# Patient Record
Sex: Male | Born: 1955
Health system: Southern US, Community
[De-identification: ages and names within clinical notes are randomized; demographics above are authoritative.]

## PROBLEM LIST (undated history)

## (undated) DIAGNOSIS — IMO0002 Reserved for concepts with insufficient information to code with codable children: Secondary | ICD-10-CM

## (undated) DIAGNOSIS — K2270 Barrett's esophagus without dysplasia: Secondary | ICD-10-CM

## (undated) DIAGNOSIS — I219 Acute myocardial infarction, unspecified: Secondary | ICD-10-CM

## (undated) DIAGNOSIS — G629 Polyneuropathy, unspecified: Secondary | ICD-10-CM

## (undated) DIAGNOSIS — F419 Anxiety disorder, unspecified: Secondary | ICD-10-CM

## (undated) DIAGNOSIS — E7800 Pure hypercholesterolemia, unspecified: Secondary | ICD-10-CM

## (undated) DIAGNOSIS — F32A Depression, unspecified: Secondary | ICD-10-CM

## (undated) DIAGNOSIS — I2510 Atherosclerotic heart disease of native coronary artery without angina pectoris: Secondary | ICD-10-CM

## (undated) DIAGNOSIS — I10 Essential (primary) hypertension: Secondary | ICD-10-CM

## (undated) DIAGNOSIS — K219 Gastro-esophageal reflux disease without esophagitis: Secondary | ICD-10-CM

## (undated) DIAGNOSIS — F329 Major depressive disorder, single episode, unspecified: Secondary | ICD-10-CM

## (undated) DIAGNOSIS — E1165 Type 2 diabetes mellitus with hyperglycemia: Secondary | ICD-10-CM

## (undated) DIAGNOSIS — Z87891 Personal history of nicotine dependence: Secondary | ICD-10-CM

## (undated) HISTORY — PX: TONSILLECTOMY: SUR1361

---

## 2006-08-08 ENCOUNTER — Inpatient Hospital Stay (HOSPITAL_COMMUNITY): Admission: EM | Admit: 2006-08-08 | Discharge: 2006-08-10 | Payer: Self-pay | Admitting: Emergency Medicine

## 2007-01-28 ENCOUNTER — Ambulatory Visit: Payer: Self-pay | Admitting: Nurse Practitioner

## 2007-01-31 ENCOUNTER — Ambulatory Visit: Payer: Self-pay | Admitting: *Deleted

## 2007-02-10 ENCOUNTER — Ambulatory Visit: Payer: Self-pay | Admitting: Nurse Practitioner

## 2007-04-23 ENCOUNTER — Emergency Department (HOSPITAL_COMMUNITY): Admission: EM | Admit: 2007-04-23 | Discharge: 2007-04-23 | Payer: Self-pay | Admitting: Emergency Medicine

## 2007-05-16 ENCOUNTER — Ambulatory Visit: Payer: Self-pay | Admitting: Family Medicine

## 2007-11-09 ENCOUNTER — Ambulatory Visit: Payer: Self-pay | Admitting: Internal Medicine

## 2008-06-14 ENCOUNTER — Emergency Department (HOSPITAL_COMMUNITY): Admission: EM | Admit: 2008-06-14 | Discharge: 2008-06-14 | Payer: Self-pay | Admitting: Family Medicine

## 2008-08-31 ENCOUNTER — Ambulatory Visit: Payer: Self-pay | Admitting: Internal Medicine

## 2008-09-25 ENCOUNTER — Ambulatory Visit: Payer: Self-pay | Admitting: Internal Medicine

## 2008-09-25 ENCOUNTER — Encounter: Payer: Self-pay | Admitting: Family Medicine

## 2008-09-25 LAB — CONVERTED CEMR LAB
Alkaline Phosphatase: 138 units/L — ABNORMAL HIGH (ref 39–117)
Basophils Relative: 1 % (ref 0–1)
Eosinophils Absolute: 0.1 10*3/uL (ref 0.0–0.7)
Eosinophils Relative: 3 % (ref 0–5)
Glucose, Bld: 154 mg/dL — ABNORMAL HIGH (ref 70–99)
HCT: 44.3 % (ref 39.0–52.0)
HDL: 39 mg/dL — ABNORMAL LOW (ref >39)
LDL Cholesterol: 90 mg/dL (ref 0–99)
Lymphs Abs: 1.9 10*3/uL (ref 0.7–4.0)
MCHC: 33.6 g/dL (ref 30.0–36.0)
MCV: 85.2 fL (ref 78.0–100.0)
Microalb, Ur: 0.94 mg/dL (ref 0.00–1.89)
Platelets: 285 10*3/uL (ref 150–400)
RDW: 13.4 % (ref 11.5–15.5)
Sodium: 140 meq/L (ref 135–145)
Total Bilirubin: 0.6 mg/dL (ref 0.3–1.2)
Total Protein: 7 g/dL (ref 6.0–8.3)
Triglycerides: 97 mg/dL (ref <150)
VLDL: 19 mg/dL (ref 0–40)
WBC: 4.3 10*3/uL (ref 4.0–10.5)

## 2008-09-28 ENCOUNTER — Ambulatory Visit: Payer: Self-pay | Admitting: Internal Medicine

## 2009-04-12 ENCOUNTER — Emergency Department (HOSPITAL_COMMUNITY): Admission: EM | Admit: 2009-04-12 | Discharge: 2009-04-13 | Payer: Self-pay | Admitting: Emergency Medicine

## 2009-04-12 ENCOUNTER — Emergency Department (HOSPITAL_COMMUNITY): Admission: EM | Admit: 2009-04-12 | Discharge: 2009-04-12 | Payer: Self-pay | Admitting: Family Medicine

## 2009-04-14 ENCOUNTER — Ambulatory Visit: Payer: Self-pay | Admitting: Internal Medicine

## 2009-04-14 ENCOUNTER — Inpatient Hospital Stay (HOSPITAL_COMMUNITY): Admission: EM | Admit: 2009-04-14 | Discharge: 2009-04-17 | Payer: Self-pay | Admitting: Emergency Medicine

## 2010-10-24 ENCOUNTER — Inpatient Hospital Stay (HOSPITAL_COMMUNITY): Admission: EM | Admit: 2010-10-24 | Discharge: 2010-10-26 | Payer: Self-pay | Admitting: Emergency Medicine

## 2010-11-12 ENCOUNTER — Encounter (INDEPENDENT_AMBULATORY_CARE_PROVIDER_SITE_OTHER): Payer: Self-pay | Admitting: *Deleted

## 2010-11-12 LAB — CONVERTED CEMR LAB
ALT: 10 units/L (ref 0–53)
AST: 17 units/L (ref 0–37)
Albumin: 3.8 g/dL (ref 3.5–5.2)
Alkaline Phosphatase: 84 units/L (ref 39–117)
BUN: 12 mg/dL (ref 6–23)
CO2: 21 meq/L (ref 19–32)
Calcium: 9 mg/dL (ref 8.4–10.5)
Chloride: 103 meq/L (ref 96–112)
Creatinine, Ser: 1.04 mg/dL (ref 0.40–1.50)
Glucose, Bld: 351 mg/dL — ABNORMAL HIGH (ref 70–99)
Hgb A1c MFr Bld: 10.2 % — ABNORMAL HIGH (ref <5.7)
Microalb, Ur: 0.5 mg/dL (ref 0.00–1.89)
Potassium: 4.4 meq/L (ref 3.5–5.3)
Sodium: 136 meq/L (ref 135–145)
Total Bilirubin: 0.4 mg/dL (ref 0.3–1.2)
Total Protein: 6.1 g/dL (ref 6.0–8.3)

## 2010-12-11 ENCOUNTER — Emergency Department (HOSPITAL_COMMUNITY)
Admission: EM | Admit: 2010-12-11 | Discharge: 2010-12-11 | Payer: Self-pay | Source: Home / Self Care | Admitting: Family Medicine

## 2011-01-06 ENCOUNTER — Emergency Department (HOSPITAL_COMMUNITY): Payer: Self-pay

## 2011-01-06 ENCOUNTER — Inpatient Hospital Stay (HOSPITAL_COMMUNITY)
Admission: AD | Admit: 2011-01-06 | Discharge: 2011-01-08 | DRG: 638 | Discharge status: Against Medical Advice | Payer: Self-pay | Source: Other Acute Inpatient Hospital | Attending: Family Medicine | Admitting: Family Medicine

## 2011-01-06 ENCOUNTER — Emergency Department (HOSPITAL_COMMUNITY)
Admission: EM | Admit: 2011-01-06 | Discharge: 2011-01-06 | Disposition: A | Discharge status: Other Acute Inpatient Hospital | Payer: Self-pay | Attending: Emergency Medicine | Admitting: Emergency Medicine

## 2011-01-06 DIAGNOSIS — I498 Other specified cardiac arrhythmias: Secondary | ICD-10-CM | POA: Diagnosis present

## 2011-01-06 DIAGNOSIS — Z91199 Patient's noncompliance with other medical treatment and regimen due to unspecified reason: Secondary | ICD-10-CM

## 2011-01-06 DIAGNOSIS — E875 Hyperkalemia: Secondary | ICD-10-CM | POA: Diagnosis present

## 2011-01-06 DIAGNOSIS — Z794 Long term (current) use of insulin: Secondary | ICD-10-CM

## 2011-01-06 DIAGNOSIS — D72829 Elevated white blood cell count, unspecified: Secondary | ICD-10-CM | POA: Diagnosis present

## 2011-01-06 DIAGNOSIS — M25569 Pain in unspecified knee: Secondary | ICD-10-CM | POA: Diagnosis present

## 2011-01-06 DIAGNOSIS — E1010 Type 1 diabetes mellitus with ketoacidosis without coma: Secondary | ICD-10-CM | POA: Insufficient documentation

## 2011-01-06 DIAGNOSIS — E860 Dehydration: Secondary | ICD-10-CM | POA: Diagnosis present

## 2011-01-06 DIAGNOSIS — E1310 Other specified diabetes mellitus with ketoacidosis without coma: Principal | ICD-10-CM | POA: Diagnosis present

## 2011-01-06 DIAGNOSIS — N179 Acute kidney failure, unspecified: Secondary | ICD-10-CM | POA: Diagnosis present

## 2011-01-06 DIAGNOSIS — R4182 Altered mental status, unspecified: Secondary | ICD-10-CM | POA: Diagnosis present

## 2011-01-06 DIAGNOSIS — N289 Disorder of kidney and ureter, unspecified: Secondary | ICD-10-CM | POA: Insufficient documentation

## 2011-01-06 DIAGNOSIS — Z9119 Patient's noncompliance with other medical treatment and regimen: Secondary | ICD-10-CM

## 2011-01-06 LAB — URINALYSIS, ROUTINE W REFLEX MICROSCOPIC
Bilirubin Urine: NEGATIVE
Hgb urine dipstick: NEGATIVE
Ketones, ur: >80 mg/dL — AB
Leukocytes, UA: NEGATIVE
Nitrite: NEGATIVE
Protein, ur: NEGATIVE mg/dL
Specific Gravity, Urine: 1.029 (ref 1.005–1.030)
Urine Glucose, Fasting: >1000 mg/dL — AB
Urobilinogen, UA: 0.2 mg/dL (ref 0.0–1.0)
pH: 5.5 (ref 5.0–8.0)

## 2011-01-06 LAB — COMPREHENSIVE METABOLIC PANEL WITH GFR
ALT: 14 U/L (ref 0–53)
Alkaline Phosphatase: 114 U/L (ref 39–117)
BUN: 25 mg/dL — ABNORMAL HIGH (ref 6–23)
CO2: 14 meq/L — ABNORMAL LOW (ref 19–32)
GFR calc non Af Amer: 37 mL/min — ABNORMAL LOW (ref >60)
Glucose, Bld: 843 mg/dL (ref 70–99)
Potassium: 6 meq/L — ABNORMAL HIGH (ref 3.5–5.1)
Sodium: 133 meq/L — ABNORMAL LOW (ref 135–145)

## 2011-01-06 LAB — COMPREHENSIVE METABOLIC PANEL
AST: 21 U/L (ref 0–37)
Albumin: 4.8 g/dL (ref 3.5–5.2)
Calcium: 10.5 mg/dL (ref 8.4–10.5)
Chloride: 95 mEq/L — ABNORMAL LOW (ref 96–112)
Creatinine, Ser: 1.94 mg/dL — ABNORMAL HIGH (ref 0.4–1.5)
GFR calc Af Amer: 44 mL/min — ABNORMAL LOW (ref >60)
Total Bilirubin: 1.4 mg/dL — ABNORMAL HIGH (ref 0.3–1.2)
Total Protein: 8.1 g/dL (ref 6.0–8.3)

## 2011-01-06 LAB — CBC
HCT: 48.1 % (ref 39.0–52.0)
Hemoglobin: 16.2 g/dL (ref 13.0–17.0)
MCH: 30.7 pg (ref 26.0–34.0)
MCHC: 33.7 g/dL (ref 30.0–36.0)
MCV: 91.3 fL (ref 78.0–100.0)
Platelets: 305 K/uL (ref 150–400)
RBC: 5.27 MIL/uL (ref 4.22–5.81)
RDW: 12.8 % (ref 11.5–15.5)
WBC: 14.8 K/uL — ABNORMAL HIGH (ref 4.0–10.5)

## 2011-01-06 LAB — BASIC METABOLIC PANEL
Calcium: 9.9 mg/dL (ref 8.4–10.5)
GFR calc Af Amer: 41 mL/min — ABNORMAL LOW (ref >60)
GFR calc non Af Amer: 34 mL/min — ABNORMAL LOW (ref >60)
Potassium: 4.8 mEq/L (ref 3.5–5.1)
Sodium: 143 mEq/L (ref 135–145)

## 2011-01-06 LAB — GLUCOSE, CAPILLARY
Glucose-Capillary: >600 mg/dL (ref 70–99)
Glucose-Capillary: >600 mg/dL (ref 70–99)
Glucose-Capillary: >600 mg/dL (ref 70–99)
Glucose-Capillary: 470 mg/dL — ABNORMAL HIGH (ref 70–99)
Glucose-Capillary: 422 mg/dL — ABNORMAL HIGH (ref 70–99)
Glucose-Capillary: 317 mg/dL — ABNORMAL HIGH (ref 70–99)
Glucose-Capillary: 244 mg/dL — ABNORMAL HIGH (ref 70–99)
Glucose-Capillary: 280 mg/dL — ABNORMAL HIGH (ref 70–99)
Glucose-Capillary: 283 mg/dL — ABNORMAL HIGH (ref 70–99)
Glucose-Capillary: 314 mg/dL — ABNORMAL HIGH (ref 70–99)

## 2011-01-06 LAB — DIFFERENTIAL
Basophils Absolute: 0 10*3/uL (ref 0.0–0.1)
Basophils Relative: 0 % (ref 0–1)
Eosinophils Absolute: 0 K/uL (ref 0.0–0.7)
Eosinophils Relative: 0 % (ref 0–5)
Lymphocytes Relative: 6 % — ABNORMAL LOW (ref 12–46)
Lymphs Abs: 0.9 K/uL (ref 0.7–4.0)
Monocytes Absolute: 0.5 K/uL (ref 0.1–1.0)
Monocytes Relative: 3 % (ref 3–12)
Neutro Abs: 13.5 10*3/uL — ABNORMAL HIGH (ref 1.7–7.7)
Neutrophils Relative %: 91 % — ABNORMAL HIGH (ref 43–77)

## 2011-01-06 LAB — BASIC METABOLIC PANEL WITH GFR
BUN: 25 mg/dL — ABNORMAL HIGH (ref 6–23)
CO2: 14 meq/L — ABNORMAL LOW (ref 19–32)
Chloride: 107 meq/L (ref 96–112)
Creatinine, Ser: 2.06 mg/dL — ABNORMAL HIGH (ref 0.4–1.5)
Glucose, Bld: 479 mg/dL — ABNORMAL HIGH (ref 70–99)

## 2011-01-06 LAB — CK TOTAL AND CKMB (NOT AT ARMC)
CK, MB: 1.7 ng/mL (ref 0.3–4.0)
Relative Index: INVALID (ref 0.0–2.5)
Total CK: 47 U/L (ref 7–232)

## 2011-01-06 LAB — BLOOD GAS, VENOUS
Acid-base deficit: 13.3 mmol/L — ABNORMAL HIGH (ref 0.0–2.0)
Bicarbonate: 12.8 mEq/L — ABNORMAL LOW (ref 20.0–24.0)
O2 Saturation: 75.3 %
Patient temperature: 98.6
TCO2: 11.7 mmol/L (ref 0–100)
pCO2, Ven: 31.1 mmHg — ABNORMAL LOW (ref 45.0–50.0)
pH, Ven: 7.239 — ABNORMAL LOW (ref 7.250–7.300)
pO2, Ven: 45.3 mmHg — ABNORMAL HIGH (ref 30.0–45.0)

## 2011-01-06 LAB — KETONES, QUALITATIVE

## 2011-01-06 LAB — URINE MICROSCOPIC-ADD ON

## 2011-01-06 LAB — TROPONIN I: Troponin I: 0.03 ng/mL (ref 0.00–0.06)

## 2011-01-06 LAB — PHOSPHORUS: Phosphorus: 4.8 mg/dL — ABNORMAL HIGH (ref 2.3–4.6)

## 2011-01-06 LAB — MAGNESIUM: Magnesium: 2.8 mg/dL — ABNORMAL HIGH (ref 1.5–2.5)

## 2011-01-06 LAB — TSH: TSH: 0.596 u[IU]/mL (ref 0.350–4.500)

## 2011-01-07 ENCOUNTER — Inpatient Hospital Stay (HOSPITAL_COMMUNITY): Payer: Self-pay

## 2011-01-07 LAB — HEMOGLOBIN A1C
Hgb A1c MFr Bld: 10.1 % — ABNORMAL HIGH (ref <5.7)
Hgb A1c MFr Bld: 10.3 % — ABNORMAL HIGH (ref <5.7)
Mean Plasma Glucose: 243 mg/dL — ABNORMAL HIGH (ref <117)
Mean Plasma Glucose: 249 mg/dL — ABNORMAL HIGH (ref <117)

## 2011-01-07 LAB — BASIC METABOLIC PANEL WITH GFR
BUN: 22 mg/dL (ref 6–23)
BUN: 20 mg/dL (ref 6–23)
BUN: 19 mg/dL (ref 6–23)
CO2: 16 meq/L — ABNORMAL LOW (ref 19–32)
CO2: 21 meq/L (ref 19–32)
Calcium: 9.2 mg/dL (ref 8.4–10.5)
Calcium: 9 mg/dL (ref 8.4–10.5)
Calcium: 8.6 mg/dL (ref 8.4–10.5)
Chloride: 114 meq/L — ABNORMAL HIGH (ref 96–112)
Chloride: 109 meq/L (ref 96–112)
Chloride: 113 meq/L — ABNORMAL HIGH (ref 96–112)
Creatinine, Ser: 1.45 mg/dL (ref 0.4–1.5)
Creatinine, Ser: 1.32 mg/dL (ref 0.4–1.5)
Creatinine, Ser: 1.19 mg/dL (ref 0.4–1.5)
Creatinine, Ser: 1.16 mg/dL (ref 0.4–1.5)
GFR calc Af Amer: >60 mL/min (ref >60)
GFR calc non Af Amer: 51 mL/min — ABNORMAL LOW (ref >60)
GFR calc non Af Amer: 57 mL/min — ABNORMAL LOW (ref >60)
GFR calc non Af Amer: >60 mL/min (ref >60)
Glucose, Bld: 250 mg/dL — ABNORMAL HIGH (ref 70–99)
Glucose, Bld: 155 mg/dL — ABNORMAL HIGH (ref 70–99)
Glucose, Bld: 192 mg/dL — ABNORMAL HIGH (ref 70–99)
Potassium: 4.5 meq/L (ref 3.5–5.1)
Potassium: 4.2 meq/L (ref 3.5–5.1)
Potassium: 4.2 meq/L (ref 3.5–5.1)
Potassium: 4.7 meq/L (ref 3.5–5.1)
Sodium: 137 meq/L (ref 135–145)

## 2011-01-07 LAB — DIFFERENTIAL
Basophils Absolute: 0 10*3/uL (ref 0.0–0.1)
Basophils Relative: 0 % (ref 0–1)
Eosinophils Absolute: 0 K/uL (ref 0.0–0.7)
Eosinophils Relative: 0 % (ref 0–5)
Lymphocytes Relative: 15 % (ref 12–46)
Lymphs Abs: 2.4 K/uL (ref 0.7–4.0)
Monocytes Absolute: 1.2 10*3/uL — ABNORMAL HIGH (ref 0.1–1.0)
Monocytes Relative: 8 % (ref 3–12)
Neutro Abs: 11.8 10*3/uL — ABNORMAL HIGH (ref 1.7–7.7)
Neutrophils Relative %: 76 % (ref 43–77)

## 2011-01-07 LAB — BASIC METABOLIC PANEL
BUN: 17 mg/dL (ref 6–23)
BUN: 15 mg/dL (ref 6–23)
CO2: 21 mEq/L (ref 19–32)
CO2: 21 mEq/L (ref 19–32)
CO2: 21 mEq/L (ref 19–32)
Calcium: 9.5 mg/dL (ref 8.4–10.5)
Calcium: 8.5 mg/dL (ref 8.4–10.5)
Chloride: 115 mEq/L — ABNORMAL HIGH (ref 96–112)
Chloride: 112 mEq/L (ref 96–112)
Creatinine, Ser: 1.31 mg/dL (ref 0.4–1.5)
GFR calc Af Amer: >60 mL/min (ref >60)
GFR calc Af Amer: >60 mL/min (ref >60)
GFR calc Af Amer: >60 mL/min (ref >60)
GFR calc Af Amer: >60 mL/min (ref >60)
GFR calc non Af Amer: 57 mL/min — ABNORMAL LOW (ref >60)
GFR calc non Af Amer: >60 mL/min (ref >60)
Glucose, Bld: 176 mg/dL — ABNORMAL HIGH (ref 70–99)
Glucose, Bld: 422 mg/dL — ABNORMAL HIGH (ref 70–99)
Potassium: 3.9 mEq/L (ref 3.5–5.1)
Sodium: 141 mEq/L (ref 135–145)
Sodium: 145 mEq/L (ref 135–145)
Sodium: 144 mEq/L (ref 135–145)
Sodium: 140 mEq/L (ref 135–145)

## 2011-01-07 LAB — URINE CULTURE
Colony Count: NO GROWTH
Culture  Setup Time: 201202072101
Culture: NO GROWTH

## 2011-01-07 LAB — LIPID PANEL
Cholesterol: 172 mg/dL (ref 0–200)
HDL: 47 mg/dL (ref >39)
LDL Cholesterol: 113 mg/dL — ABNORMAL HIGH (ref 0–99)
Total CHOL/HDL Ratio: 3.7 ratio
Triglycerides: 60 mg/dL (ref <150)
VLDL: 12 mg/dL (ref 0–40)

## 2011-01-07 LAB — CBC
HCT: 40.2 % (ref 39.0–52.0)
Hemoglobin: 13.6 g/dL (ref 13.0–17.0)
MCH: 29.4 pg (ref 26.0–34.0)
MCHC: 33.8 g/dL (ref 30.0–36.0)
MCV: 86.8 fL (ref 78.0–100.0)
Platelets: 325 K/uL (ref 150–400)
RBC: 4.63 MIL/uL (ref 4.22–5.81)
RDW: 13 % (ref 11.5–15.5)
WBC: 15.5 K/uL — ABNORMAL HIGH (ref 4.0–10.5)

## 2011-01-07 LAB — CARDIAC PANEL(CRET KIN+CKTOT+MB+TROPI)
CK, MB: 2.8 ng/mL (ref 0.3–4.0)
CK, MB: 5.1 ng/mL — ABNORMAL HIGH (ref 0.3–4.0)
Relative Index: 1 (ref 0.0–2.5)
Relative Index: 1.4 (ref 0.0–2.5)
Total CK: 276 U/L — ABNORMAL HIGH (ref 7–232)
Total CK: 359 U/L — ABNORMAL HIGH (ref 7–232)
Troponin I: <0.01 ng/mL (ref 0.00–0.06)
Troponin I: 0.01 ng/mL (ref 0.00–0.06)

## 2011-01-07 LAB — GLUCOSE, CAPILLARY
Glucose-Capillary: 130 mg/dL — ABNORMAL HIGH (ref 70–99)
Glucose-Capillary: 160 mg/dL — ABNORMAL HIGH (ref 70–99)
Glucose-Capillary: 201 mg/dL — ABNORMAL HIGH (ref 70–99)
Glucose-Capillary: 231 mg/dL — ABNORMAL HIGH (ref 70–99)
Glucose-Capillary: 238 mg/dL — ABNORMAL HIGH (ref 70–99)
Glucose-Capillary: 161 mg/dL — ABNORMAL HIGH (ref 70–99)
Glucose-Capillary: 261 mg/dL — ABNORMAL HIGH (ref 70–99)
Glucose-Capillary: 184 mg/dL — ABNORMAL HIGH (ref 70–99)

## 2011-01-07 LAB — PHOSPHORUS: Phosphorus: 2.5 mg/dL (ref 2.3–4.6)

## 2011-01-07 LAB — MAGNESIUM: Magnesium: 2.3 mg/dL (ref 1.5–2.5)

## 2011-01-07 LAB — TSH: TSH: 0.562 u[IU]/mL (ref 0.350–4.500)

## 2011-01-08 LAB — COMPREHENSIVE METABOLIC PANEL
AST: 20 U/L (ref 0–37)
Albumin: 3.1 g/dL — ABNORMAL LOW (ref 3.5–5.2)
Alkaline Phosphatase: 67 U/L (ref 39–117)
BUN: 10 mg/dL (ref 6–23)
Chloride: 112 mEq/L (ref 96–112)
GFR calc Af Amer: >60 mL/min (ref >60)
Potassium: 3.6 mEq/L (ref 3.5–5.1)
Total Bilirubin: 0.5 mg/dL (ref 0.3–1.2)

## 2011-01-08 LAB — URINE CULTURE
Colony Count: NO GROWTH
Culture  Setup Time: 201202080458
Culture: NO GROWTH

## 2011-01-08 LAB — CBC
Hemoglobin: 12.6 g/dL — ABNORMAL LOW (ref 13.0–17.0)
MCV: 87.4 fL (ref 78.0–100.0)
Platelets: 259 10*3/uL (ref 150–400)
RBC: 4.19 MIL/uL — ABNORMAL LOW (ref 4.22–5.81)
WBC: 8.1 10*3/uL (ref 4.0–10.5)

## 2011-01-08 LAB — GLUCOSE, CAPILLARY

## 2011-01-12 LAB — CULTURE, BLOOD (ROUTINE X 2)
Culture  Setup Time: 201202072059
Culture: NO GROWTH

## 2011-01-13 LAB — CULTURE, BLOOD (ROUTINE X 2)
Culture  Setup Time: 201202080038
Culture  Setup Time: 201202080911
Culture  Setup Time: 201202080912
Culture: NO GROWTH
Culture: NO GROWTH
Culture: NO GROWTH

## 2011-01-13 NOTE — Discharge Summary (Signed)
  NAMEMARQUON, [name] NO.:  [id number]  MEDICAL RECORD NO.:  [id number]           PATIENT TYPE:  I  LOCATION:  3731                         FACILITY:  [facility]  PHYSICIAN:  Tarry Kos, MD       DATE OF BIRTH:  Feb 13, 1956  DATE OF ADMISSION:  01/06/2011 DATE OF DISCHARGE:  01/08/2011                              DISCHARGE SUMMARY   AMA summary is as follows:  [name] is a 55 year old male who presented to the hospital several days ago in DKA with altered mental status, dehydrated, and acute renal failure.  He was placed in ICU on a DKA drip.  His anion gap closed.  His DKA resolved.  His renal failure improved and returned back to his normal baseline.  He was switched over q. a.c. at bedtime sugar checks along with sliding scale insulin and Lantus and was not really tolerating full diet today.  He was not eating and then all of a sudden said that he had a family emergency and had to leave the hospital against medical advice.  He left prior to receiving any instructions.  We did make sure he had insulin at home; however, he was not tolerating eating very much.  He was eating less than 50% of what he normally eats.  However, again he left without any instructions against medical advice.          ______________________________ Tarry Kos, MD     RD/MEDQ  D:  01/08/2011  T:  01/09/2011  Job:  [id number]  Electronically Signed by Eldridge Dace MD on 01/13/2011 01:31:18 PM

## 2011-01-14 ENCOUNTER — Encounter (INDEPENDENT_AMBULATORY_CARE_PROVIDER_SITE_OTHER): Payer: Self-pay | Admitting: *Deleted

## 2011-01-14 LAB — CONVERTED CEMR LAB
BUN: 9 mg/dL (ref 6–23)
Basophils Absolute: 0 10*3/uL (ref 0.0–0.1)
CO2: 22 meq/L (ref 19–32)
Calcium: 8.9 mg/dL (ref 8.4–10.5)
Chloride: 102 meq/L (ref 96–112)
Creatinine, Ser: 0.84 mg/dL (ref 0.40–1.50)
Eosinophils Relative: 1 % (ref 0–5)
HCT: 41.8 % (ref 39.0–52.0)
Hemoglobin: 13.7 g/dL (ref 13.0–17.0)
Lymphocytes Relative: 21 % (ref 12–46)
MCHC: 32.8 g/dL (ref 30.0–36.0)
Monocytes Absolute: 0.3 10*3/uL (ref 0.1–1.0)
Monocytes Relative: 5 % (ref 3–12)
Neutro Abs: 3.8 10*3/uL (ref 1.7–7.7)
RBC: 4.63 M/uL (ref 4.22–5.81)
RDW: 13.3 % (ref 11.5–15.5)

## 2011-02-10 LAB — URINALYSIS, ROUTINE W REFLEX MICROSCOPIC
Leukocytes, UA: NEGATIVE
Nitrite: NEGATIVE
Specific Gravity, Urine: 1.024 (ref 1.005–1.030)
Urobilinogen, UA: 0.2 mg/dL (ref 0.0–1.0)

## 2011-02-10 LAB — BASIC METABOLIC PANEL
BUN: 19 mg/dL (ref 6–23)
BUN: 22 mg/dL (ref 6–23)
BUN: 24 mg/dL — ABNORMAL HIGH (ref 6–23)
CO2: 14 mEq/L — ABNORMAL LOW (ref 19–32)
CO2: 14 mEq/L — ABNORMAL LOW (ref 19–32)
Chloride: 108 mEq/L (ref 96–112)
Chloride: 108 mEq/L (ref 96–112)
Chloride: 98 mEq/L (ref 96–112)
Creatinine, Ser: 1.7 mg/dL — ABNORMAL HIGH (ref 0.4–1.5)
Creatinine, Ser: 1.72 mg/dL — ABNORMAL HIGH (ref 0.4–1.5)
Creatinine, Ser: 1.9 mg/dL — ABNORMAL HIGH (ref 0.4–1.5)
GFR calc Af Amer: 45 mL/min — ABNORMAL LOW (ref >60)
GFR calc Af Amer: >60 mL/min (ref >60)
GFR calc non Af Amer: 37 mL/min — ABNORMAL LOW (ref >60)
GFR calc non Af Amer: >60 mL/min (ref >60)
Glucose, Bld: 295 mg/dL — ABNORMAL HIGH (ref 70–99)
Glucose, Bld: 569 mg/dL (ref 70–99)
Potassium: 3.4 mEq/L — ABNORMAL LOW (ref 3.5–5.1)
Potassium: 4.2 mEq/L (ref 3.5–5.1)
Potassium: 5.9 mEq/L — ABNORMAL HIGH (ref 3.5–5.1)
Sodium: 138 mEq/L (ref 135–145)

## 2011-02-10 LAB — GLUCOSE, CAPILLARY
Glucose-Capillary: 182 mg/dL — ABNORMAL HIGH (ref 70–99)
Glucose-Capillary: 197 mg/dL — ABNORMAL HIGH (ref 70–99)
Glucose-Capillary: 203 mg/dL — ABNORMAL HIGH (ref 70–99)
Glucose-Capillary: 204 mg/dL — ABNORMAL HIGH (ref 70–99)
Glucose-Capillary: 244 mg/dL — ABNORMAL HIGH (ref 70–99)
Glucose-Capillary: 265 mg/dL — ABNORMAL HIGH (ref 70–99)
Glucose-Capillary: 270 mg/dL — ABNORMAL HIGH (ref 70–99)
Glucose-Capillary: 313 mg/dL — ABNORMAL HIGH (ref 70–99)
Glucose-Capillary: 314 mg/dL — ABNORMAL HIGH (ref 70–99)
Glucose-Capillary: 367 mg/dL — ABNORMAL HIGH (ref 70–99)
Glucose-Capillary: 451 mg/dL — ABNORMAL HIGH (ref 70–99)
Glucose-Capillary: 567 mg/dL (ref 70–99)
Glucose-Capillary: 90 mg/dL (ref 70–99)
Glucose-Capillary: 91 mg/dL (ref 70–99)

## 2011-02-10 LAB — CBC
HCT: 37 % — ABNORMAL LOW (ref 39.0–52.0)
HCT: 45.4 % (ref 39.0–52.0)
MCH: 30.4 pg (ref 26.0–34.0)
MCHC: 34.1 g/dL (ref 30.0–36.0)
MCV: 86.2 fL (ref 78.0–100.0)
MCV: 86.5 fL (ref 78.0–100.0)
MCV: 89 fL (ref 78.0–100.0)
Platelets: 271 10*3/uL (ref 150–400)
Platelets: 294 10*3/uL (ref 150–400)
RBC: 4.29 MIL/uL (ref 4.22–5.81)
RBC: 4.46 MIL/uL (ref 4.22–5.81)
RDW: 12.5 % (ref 11.5–15.5)
RDW: 12.5 % (ref 11.5–15.5)
WBC: 10.9 10*3/uL — ABNORMAL HIGH (ref 4.0–10.5)
WBC: 6.1 10*3/uL (ref 4.0–10.5)

## 2011-02-10 LAB — HEPATIC FUNCTION PANEL
Albumin: 4.5 g/dL (ref 3.5–5.2)
Alkaline Phosphatase: 99 U/L (ref 39–117)
Bilirubin, Direct: 0.7 mg/dL — ABNORMAL HIGH (ref 0.0–0.3)
Total Bilirubin: 2.4 mg/dL — ABNORMAL HIGH (ref 0.3–1.2)

## 2011-02-10 LAB — DIFFERENTIAL
Basophils Absolute: 0 10*3/uL (ref 0.0–0.1)
Eosinophils Absolute: 0 10*3/uL (ref 0.0–0.7)
Lymphs Abs: 1.4 10*3/uL (ref 0.7–4.0)
Monocytes Absolute: 0.6 10*3/uL (ref 0.1–1.0)
Monocytes Relative: 4 % (ref 3–12)
Neutro Abs: 14 10*3/uL — ABNORMAL HIGH (ref 1.7–7.7)

## 2011-02-10 LAB — CK TOTAL AND CKMB (NOT AT ARMC)
CK, MB: 1.5 ng/mL (ref 0.3–4.0)
Total CK: 93 U/L (ref 7–232)

## 2011-02-10 LAB — COMPREHENSIVE METABOLIC PANEL
ALT: 15 U/L (ref 0–53)
AST: 17 U/L (ref 0–37)
Albumin: 3.3 g/dL — ABNORMAL LOW (ref 3.5–5.2)
Alkaline Phosphatase: 77 U/L (ref 39–117)
BUN: 13 mg/dL (ref 6–23)
Chloride: 110 mEq/L (ref 96–112)
Potassium: 3.9 mEq/L (ref 3.5–5.1)
Sodium: 139 mEq/L (ref 135–145)
Total Bilirubin: 1 mg/dL (ref 0.3–1.2)

## 2011-02-10 LAB — POCT I-STAT 3, VENOUS BLOOD GAS (G3P V): pH, Ven: 7.202 — ABNORMAL LOW (ref 7.250–7.300)

## 2011-02-10 LAB — HEMOGLOBIN A1C
Hgb A1c MFr Bld: 9.5 % — ABNORMAL HIGH (ref <5.7)
Mean Plasma Glucose: 226 mg/dL — ABNORMAL HIGH (ref <117)

## 2011-02-10 LAB — LIPID PANEL
LDL Cholesterol: 116 mg/dL — ABNORMAL HIGH (ref 0–99)
Total CHOL/HDL Ratio: 4 RATIO
VLDL: 21 mg/dL (ref 0–40)

## 2011-02-10 LAB — CARDIAC PANEL(CRET KIN+CKTOT+MB+TROPI)
Total CK: 104 U/L (ref 7–232)
Troponin I: 0.01 ng/mL (ref 0.00–0.06)
Troponin I: 0.03 ng/mL (ref 0.00–0.06)

## 2011-02-10 LAB — MRSA PCR SCREENING: MRSA by PCR: NEGATIVE

## 2011-02-10 LAB — LIPASE, BLOOD: Lipase: 17 U/L (ref 11–59)

## 2011-02-10 LAB — MAGNESIUM: Magnesium: 2.3 mg/dL (ref 1.5–2.5)

## 2011-02-10 LAB — URINE CULTURE: Colony Count: NO GROWTH

## 2011-02-10 LAB — URINE MICROSCOPIC-ADD ON

## 2011-02-17 NOTE — H&P (Signed)
NAMEMILFRED, KRAMMES NO.:  [id number]  MEDICAL RECORD NO.:  [id number]           PATIENT TYPE:  E  LOCATION:  WLED                         FACILITY:  [facility]  PHYSICIAN:  Homero Fellers, MD   DATE OF BIRTH:  [birth year]  DATE OF ADMISSION:  01/06/2011 DATE OF DISCHARGE:                             HISTORY & PHYSICAL   PRIMARY CARE PHYSICIAN:  Dr. Martha Clan.  CHIEF COMPLAINT:  Vomiting, abdominal pain and change in mental status.  HISTORY OF PRESENT ILLNESS:  This is a 55 year old man with known history of diabetes mellitus on insulin, who has had a previous admission for DKA.  He presented with 1-2 day history of vomiting, abdominal discomfort and change in mental status.  He denied any fever or cough, but he complained of mild shortness of breath and vague chest pain.  The patient vomited twice today.  He has been taking his Lantus and NovoLog sliding scale as scheduled.  According to family members, he has not been acting right for the past couple of days.  He has had on and off diarrhea for several weeks.  No urinary symptoms.  In the emergency room, he was found to have a glucose of 843 with anion gap of 24, moderate acetone in blood and a venous pH of 7.24.  He is currently on the usual treatment for DKA, including IV fluids and insulin drip. The patient was less drowsy when I saw him but he is still quite somnolent.  There is no headache, loss of consciousness or seizure activity.  There is also no fever.  PAST MEDICAL HISTORY:  Includes insulin-requiring diabetes mellitus. His last admission was in November of last year, also for DKA and acute kidney failure.  It is unclear to me at this time whether there are issues with compliance with his medication.  His past medical history includes diabetes mellitus, history of DKA, high blood pressure, hyperlipidemia.  MEDICATIONS:  Includes Lantus insulin, NovoLog and aspirin; full doses unavailable at  this time.  ALLERGIES:  None.  SOCIAL HISTORY:  No smoking, alcohol or drug use.  The patient lives with his wife.  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  FAMILY HISTORY:  Mother is deceased at age 15; and father is deceased at age 39 from throat cancer.  He has one brother of age 30 who has type 2 diabetes mellitus.  REVIEW OF SYSTEMS:  A 10-point review of systems is essentially as described above.  PHYSICAL EXAMINATION:  VITAL SIGNS:  Blood pressure is 133/72, pulse 123, respirations 18, temperature 97.7. GENERAL:  The patient is somnolent but easily arousable, appeared to be in no respiratory distress. HEENT:  Pallor.  Mouth is dry. NECK:  Supple.  No JVD, adenopathy or thyromegaly. LUNGS:  Clear to auscultation bilaterally.  No wheezing or crackles. HEART:  S1-S2, regular rate and rhythm.  No murmurs, rubs or gallop. ABDOMEN:  Full, soft, vague epigastric tenderness.  Bowel sounds present.  No masses. EXTREMITIES:  No edema, clubbing or cyanosis. NEUROLOGIC:  No deficits.  The patient moves all extremities, easily arousable.  Speech is  clear. SKIN:  No rash or lesion.  PERTINENT LABORATORY AND X-RAY DATA:  Laboratory as described above, his glucose on admission was 843, anion gap 24.  CO2 of 14, potassium 6, sodium 133.  Liver enzymes are normal.  Urinalysis:  Apart from a few bacteria, is unremarkable.  Venous pH of 7.23, moderate acetone, white count 14.8, with a left shift, hemoglobin 16.2.  Chest x-ray is pending. EKG showed sinus tachycardia.  ASSESSMENT:  This is a 55 year old man admitted with: 1. Diabetic ketoacidosis. 2. Dehydration and acute kidney failure, which is likely secondary to     vomiting from his diabetic ketoacidosis.  His creatinine in     November of last year was 0.92. 3. Leukocytosis with no clear source of infection. 4. Uncontrolled diabetes mellitus. 5. Hyperkalemia, likely part of diabetic ketoacidosis on presentation.  PLAN:  Admit to  step-down unit.  Treat in the usual fashion with IV fluids, insulin drip, potassium supplementation when needed and replacement of other electrolytes such as magnesium and phosphorous if these are abnormal.  In view of his leukocytosis and change in mental status, I will start him on empiric antibiotics.  Will also get hemoglobin A1c, serial BMP and lipid profile.  I recommend getting diabetic education before discharge to minimize frequent diabetic ketoacidosis.  His condition at this time is guarded.  Critical Care time spent:  65 minutes.     Homero Fellers, MD     FA/MEDQ  D:  01/06/2011  T:  01/06/2011  Job:  [id number]  Electronically Signed by Homero Fellers  on 02/17/2011 02:05:24 AM

## 2011-02-20 ENCOUNTER — Other Ambulatory Visit (HOSPITAL_COMMUNITY): Payer: Self-pay | Admitting: Family Medicine

## 2011-02-20 DIAGNOSIS — S83419A Sprain of medial collateral ligament of unspecified knee, initial encounter: Secondary | ICD-10-CM

## 2011-02-22 ENCOUNTER — Inpatient Hospital Stay (HOSPITAL_COMMUNITY): Admission: RE | Admit: 2011-02-22 | Payer: Self-pay | Source: Ambulatory Visit

## 2011-03-10 LAB — POCT I-STAT, CHEM 8
BUN: 21 mg/dL (ref 6–23)
Calcium, Ion: 1.12 mmol/L (ref 1.12–1.32)
Calcium, Ion: 1.19 mmol/L (ref 1.12–1.32)
Chloride: 102 mEq/L (ref 96–112)
Creatinine, Ser: 1.4 mg/dL (ref 0.4–1.5)
Creatinine, Ser: 1.1 mg/dL (ref 0.4–1.5)
Glucose, Bld: 563 mg/dL (ref 70–99)
Glucose, Bld: 596 mg/dL (ref 70–99)
Glucose, Bld: 618 mg/dL (ref 70–99)
HCT: 54 % — ABNORMAL HIGH (ref 39.0–52.0)
HCT: 50 % (ref 39.0–52.0)
Hemoglobin: 18.4 g/dL — ABNORMAL HIGH (ref 13.0–17.0)
Hemoglobin: 17 g/dL (ref 13.0–17.0)
Potassium: 5.3 mEq/L — ABNORMAL HIGH (ref 3.5–5.1)
Sodium: 131 mEq/L — ABNORMAL LOW (ref 135–145)
TCO2: 9 mmol/L (ref 0–100)
TCO2: 23 mmol/L (ref 0–100)

## 2011-03-10 LAB — BASIC METABOLIC PANEL
BUN: 12 mg/dL (ref 6–23)
BUN: 18 mg/dL (ref 6–23)
BUN: 21 mg/dL (ref 6–23)
CO2: 13 mEq/L — ABNORMAL LOW (ref 19–32)
CO2: 19 mEq/L (ref 19–32)
CO2: 20 mEq/L (ref 19–32)
CO2: 21 mEq/L (ref 19–32)
Calcium: 8.1 mg/dL — ABNORMAL LOW (ref 8.4–10.5)
Calcium: 8.1 mg/dL — ABNORMAL LOW (ref 8.4–10.5)
Calcium: 8.6 mg/dL (ref 8.4–10.5)
Calcium: 9.7 mg/dL (ref 8.4–10.5)
Chloride: 109 mEq/L (ref 96–112)
Chloride: 109 mEq/L (ref 96–112)
Chloride: 110 mEq/L (ref 96–112)
Chloride: 111 mEq/L (ref 96–112)
Chloride: 112 mEq/L (ref 96–112)
Chloride: 112 mEq/L (ref 96–112)
Creatinine, Ser: 0.7 mg/dL (ref 0.4–1.5)
Creatinine, Ser: 0.82 mg/dL (ref 0.4–1.5)
Creatinine, Ser: 0.89 mg/dL (ref 0.4–1.5)
Creatinine, Ser: 0.95 mg/dL (ref 0.4–1.5)
Creatinine, Ser: 1.22 mg/dL (ref 0.4–1.5)
Creatinine, Ser: 1.24 mg/dL (ref 0.4–1.5)
Creatinine, Ser: 1.39 mg/dL (ref 0.4–1.5)
GFR calc Af Amer: 57 mL/min — ABNORMAL LOW (ref >60)
GFR calc Af Amer: >60 mL/min (ref >60)
GFR calc Af Amer: >60 mL/min (ref >60)
GFR calc Af Amer: >60 mL/min (ref >60)
GFR calc Af Amer: >60 mL/min (ref >60)
GFR calc non Af Amer: 55 mL/min — ABNORMAL LOW (ref >60)
GFR calc non Af Amer: >60 mL/min (ref >60)
GFR calc non Af Amer: >60 mL/min (ref >60)
Glucose, Bld: 402 mg/dL — ABNORMAL HIGH (ref 70–99)
Glucose, Bld: 403 mg/dL — ABNORMAL HIGH (ref 70–99)
Potassium: 4.4 mEq/L (ref 3.5–5.1)
Potassium: 4.6 mEq/L (ref 3.5–5.1)
Potassium: 5.5 mEq/L — ABNORMAL HIGH (ref 3.5–5.1)
Potassium: 5.5 mEq/L — ABNORMAL HIGH (ref 3.5–5.1)
Sodium: 132 mEq/L — ABNORMAL LOW (ref 135–145)
Sodium: 133 mEq/L — ABNORMAL LOW (ref 135–145)
Sodium: 136 mEq/L (ref 135–145)
Sodium: 138 mEq/L (ref 135–145)
Sodium: 140 mEq/L (ref 135–145)

## 2011-03-10 LAB — GLUCOSE, CAPILLARY
Glucose-Capillary: 175 mg/dL — ABNORMAL HIGH (ref 70–99)
Glucose-Capillary: 182 mg/dL — ABNORMAL HIGH (ref 70–99)
Glucose-Capillary: 265 mg/dL — ABNORMAL HIGH (ref 70–99)
Glucose-Capillary: 267 mg/dL — ABNORMAL HIGH (ref 70–99)
Glucose-Capillary: 323 mg/dL — ABNORMAL HIGH (ref 70–99)
Glucose-Capillary: 324 mg/dL — ABNORMAL HIGH (ref 70–99)
Glucose-Capillary: 329 mg/dL — ABNORMAL HIGH (ref 70–99)
Glucose-Capillary: 347 mg/dL — ABNORMAL HIGH (ref 70–99)
Glucose-Capillary: 364 mg/dL — ABNORMAL HIGH (ref 70–99)
Glucose-Capillary: 377 mg/dL — ABNORMAL HIGH (ref 70–99)
Glucose-Capillary: 384 mg/dL — ABNORMAL HIGH (ref 70–99)
Glucose-Capillary: 391 mg/dL — ABNORMAL HIGH (ref 70–99)
Glucose-Capillary: 391 mg/dL — ABNORMAL HIGH (ref 70–99)
Glucose-Capillary: 546 mg/dL (ref 70–99)

## 2011-03-10 LAB — URINALYSIS, ROUTINE W REFLEX MICROSCOPIC
Glucose, UA: >1000 mg/dL — AB
Ketones, ur: >80 mg/dL — AB
Leukocytes, UA: NEGATIVE
Protein, ur: NEGATIVE mg/dL
Urobilinogen, UA: 0.2 mg/dL (ref 0.0–1.0)

## 2011-03-10 LAB — DIFFERENTIAL
Basophils Absolute: 0 10*3/uL (ref 0.0–0.1)
Basophils Absolute: 0 10*3/uL (ref 0.0–0.1)
Basophils Relative: 0 % (ref 0–1)
Basophils Relative: 1 % (ref 0–1)
Eosinophils Absolute: 0 10*3/uL (ref 0.0–0.7)
Eosinophils Relative: 0 % (ref 0–5)
Eosinophils Relative: 1 % (ref 0–5)
Lymphocytes Relative: 24 % (ref 12–46)
Lymphs Abs: 1 10*3/uL (ref 0.7–4.0)
Neutro Abs: 4 10*3/uL (ref 1.7–7.7)
Neutrophils Relative %: 87 % — ABNORMAL HIGH (ref 43–77)

## 2011-03-10 LAB — COMPREHENSIVE METABOLIC PANEL
ALT: 16 U/L (ref 0–53)
AST: 15 U/L (ref 0–37)
Alkaline Phosphatase: 137 U/L — ABNORMAL HIGH (ref 39–117)
Alkaline Phosphatase: 128 U/L — ABNORMAL HIGH (ref 39–117)
BUN: 22 mg/dL (ref 6–23)
CO2: 13 mEq/L — ABNORMAL LOW (ref 19–32)
CO2: 19 mEq/L (ref 19–32)
Chloride: 102 mEq/L (ref 96–112)
Chloride: 98 mEq/L (ref 96–112)
Creatinine, Ser: 1.17 mg/dL (ref 0.4–1.5)
GFR calc Af Amer: 50 mL/min — ABNORMAL LOW (ref >60)
GFR calc non Af Amer: 41 mL/min — ABNORMAL LOW (ref >60)
GFR calc non Af Amer: >60 mL/min (ref >60)
Glucose, Bld: 493 mg/dL — ABNORMAL HIGH (ref 70–99)
Potassium: 6.1 mEq/L — ABNORMAL HIGH (ref 3.5–5.1)
Potassium: 4.9 mEq/L (ref 3.5–5.1)
Sodium: 136 mEq/L (ref 135–145)
Total Bilirubin: 1.5 mg/dL — ABNORMAL HIGH (ref 0.3–1.2)
Total Bilirubin: 1.2 mg/dL (ref 0.3–1.2)

## 2011-03-10 LAB — CBC
HCT: 50.4 % (ref 39.0–52.0)
HCT: 45.6 % (ref 39.0–52.0)
Hemoglobin: 13.5 g/dL (ref 13.0–17.0)
Hemoglobin: 15.9 g/dL (ref 13.0–17.0)
MCHC: 33.9 g/dL (ref 30.0–36.0)
MCV: 87.2 fL (ref 78.0–100.0)
MCV: 87.5 fL (ref 78.0–100.0)
MCV: 88.6 fL (ref 78.0–100.0)
MCV: 87.1 fL (ref 78.0–100.0)
RBC: 4.26 MIL/uL (ref 4.22–5.81)
RBC: 4.32 MIL/uL (ref 4.22–5.81)
RBC: 4.47 MIL/uL (ref 4.22–5.81)
RBC: 5.69 MIL/uL (ref 4.22–5.81)
RBC: 5.24 MIL/uL (ref 4.22–5.81)
WBC: 4.8 10*3/uL (ref 4.0–10.5)
WBC: 6 10*3/uL (ref 4.0–10.5)
WBC: 6.2 10*3/uL (ref 4.0–10.5)
WBC: 8.9 10*3/uL (ref 4.0–10.5)
WBC: 5.8 10*3/uL (ref 4.0–10.5)

## 2011-03-10 LAB — CORTISOL: Cortisol, Plasma: 2.9 ug/dL

## 2011-03-10 LAB — LACTIC ACID, PLASMA: Lactic Acid, Venous: 2.5 mmol/L — ABNORMAL HIGH (ref 0.5–2.2)

## 2011-03-10 LAB — LIPASE, BLOOD
Lipase: 15 U/L (ref 11–59)
Lipase: 15 U/L (ref 11–59)

## 2011-03-10 LAB — POCT I-STAT 3, ART BLOOD GAS (G3+)
Acid-base deficit: 18 mmol/L — ABNORMAL HIGH (ref 0.0–2.0)
Bicarbonate: 8.3 mEq/L — ABNORMAL LOW (ref 20.0–24.0)
Patient temperature: 99
TCO2: 9 mmol/L (ref 0–100)

## 2011-03-10 LAB — VITAMIN B12: Vitamin B-12: 646 pg/mL (ref 211–911)

## 2011-03-10 LAB — RAPID URINE DRUG SCREEN, HOSP PERFORMED
Amphetamines: NOT DETECTED
Barbiturates: NOT DETECTED
Benzodiazepines: NOT DETECTED
Opiates: NOT DETECTED
Tetrahydrocannabinol: NOT DETECTED

## 2011-03-10 LAB — LIPID PANEL
Triglycerides: 87 mg/dL (ref <150)
VLDL: 17 mg/dL (ref 0–40)

## 2011-03-10 LAB — HEMOGLOBIN A1C: Hgb A1c MFr Bld: 10.6 % — ABNORMAL HIGH (ref 4.6–6.1)

## 2011-04-14 NOTE — Discharge Summary (Signed)
NAMEFINNEAS, MATHE NO.:  [id number]   MEDICAL RECORD NO.:  [id number]          PATIENT TYPE:  INP   LOCATION:  5524                         FACILITY:  [facility]   PHYSICIAN:  Madaline Guthrie, M.D.    DATE OF BIRTH:  [birth year]   DATE OF ADMISSION:  04/14/2009  DATE OF DISCHARGE:  04/17/2009                               DISCHARGE SUMMARY   DISCHARGE DIAGNOSES:  1. Vomiting, diabetic ketoacidosis in patient with diagnosis of      diabetes mellitus likely type 1.  2. Vomiting and diarrhea, most likely related to number 1, resolved at      discharge.  3. Diabetes mellitus most likely type 1.  4. Hypertension.  5. Hyperlipidemia.   DISCHARGE MEDICATIONS:  1. Lantus 54 units injected under the skin.  2. NovoLog use as directed per primary care doctor daily.  3. Aspirin 81 mg tablet once daily.  4. Lisinopril 40 mg once daily.  5. Stop taking metformin.   DISPOSITION:  The patient was discharged from the hospital in stable  condition.  DKA resolved.  No nausea, vomiting or diarrhea.  Was  tolerating fluids and solids well.   FOLLOW UP:  Follow up with primary care physician, Dr. Jenita Seashore.   The patient was told to stop taking metformin on discharge.  It was  unclear to [place] whether or not his diabetes is type 1 or type 2.  It is  most likely type 1.  Insulin alone should be added for his diabetes  control.  PCP to followup.  In addition, the patient's blood pressure  remained stable during the hospitalization with no medical management.  The patient was told by Dr. Tresa Endo that lisinopril might be started.  We  will defer this decision to his primary care physician.   PROCEDURES:  1. On Apr 14, 2009 abdominal x-ray:  No acute abdominal abnormalities.      No gas bowel pattern.  No obstruction.  2. On Apr 14, 2009 chest x-ray:  No acute cardiopulmonary findings.      No infiltrations.   HISTORY OF PRESENT ILLNESS:  The patient is a 55 year old male with  diabetes (question number 1 or number 2) and hyperlipidemia, who  presents to [facility] ED with main concern of vomiting and diarrhea  that started just a few days prior to admission associated with crampy  abdominal pain, diaphoresis, shortness of breath, weakness and chills.  He has had about 3 episodes of vomiting and diarrhea today.  No  significant alleviating or aggravating factors.  He has had similar  episodes in the past when his sugar level was in the 500's.  He was  hospitalized in the past for high sugar levels.  He reports normal level  4 days ago.  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  No  recent travel or unusual food intake.   PHYSICAL EXAMINATION:  VITAL SIGNS:  Temperature 99, blood pressure  133/78, pulse 113, respirations 20, saturating 97% on room air.  GENERAL:  The patient is lying on the bed, ill-appearing, but not in  acute distress.  HEENT:  PERRLA.  EOMI.  No scleral icterus.  Dry mucous membranes.  No  oropharyngeal erythema.  NECK:  Supple.  Full range of motion.  LUNGS:  Good air movement.  No wheezes or rhonchi.  No crackles.  HEART:  Regular rate and rhythm, tachycardic, S1 and S2 present.  No JVD  or murmurs.  ABDOMEN:  Soft, epigastric tenderness.  Bowel sounds are present.  No  guarding.  EXTREMITIES:  No edema.  NEURO:  Alert and oriented x3.  No focal neurologic deficits.   LABORATORY DATA:  Sodium 131, potassium 5.3, chloride 107, bicarb 9, BUN  28, creatinine 1.4, glucose 563, WBC 8.9, hemoglobin 13.1, platelets  267, T-bili 1.5, alkaline phosphatase 137, AST 15, ALT 16, protein 7.8,  albumin 4.4, calcium 8.2.   HOSPITAL COURSE:  1. Diabetic ketoacidosis.  The patient will be admitted with blood      sugar levels of 500 and anion gap of 15 increasing to 21 likely      secondary to running out of NovoLog.  No overt infections that      precipitated the  event.  The patient was admitted to stepdown unit      and was hydrated with fluids, and started on insulin drip.  By      hospital day 2, anion gap closed.  His sugar levels ranged from 200-      300 with no acidosis.  The patient was tolerating fluids and solids      well.  Sugars remained in 200-300 range.  UDS was checked and was      negative.  The patient was discharged on Lantus home dose as well      as NovoLog.  2. Vomiting and diarrhea likely related to number 1 with questionable      exposure to sick contacts at work.  Abdominal x-ray on reviewing,      vomiting and diarrhea resolved by hospital day 2.  The patient was      able to tolerate liquids and solids.  3. Diabetes, unclear if type 1 or type 2.  The patient was told to      stop taking metformin on discharge.  Lantus and NovoLog should be      added in diabetes management.  Hemoglobin A1c was checked.      Hemoglobin A1c was 10.6.  Unclear what A1c at baseline is.  The      patient was told to follow up with primary care physician to find      out if diabetes adequately controlled or if worsening.  4. Hyperlipidemia on FOP.  Cholesterol 160, triglycerides 87, HDL 44,      LDL 99.  Patient continues to take Lipitor.  Will continue same      medical management upon discharge.  5. Hypertension.  The patient's blood pressure remains within target      range during the hospitalization without medical management.  He      was told in the past he will need to have lisinopril started, but      we will defer this decision to primary care physician.  6. Depression.  The patient has history of depression over 5-6 years.      Has tried Zoloft in the past, but has not helped him.  He has no      specific lifestyle circumstances that bother  him and reports      basically that his baseline life agrees.  Discussed medical      management for depression.  The patient refuses medications at the      time and prefers to work on it  himself.  This can also be followed      up by his primary care physician.  TSH, vitamin B-12, RPR were      checked and all findings are within normal limits.   Vital signs on discharge:  Temperature 97, blood pressure 138/80, pulse  76, respirations 20, saturating 95-98% on room air.   Blood work:  Sodium 140, potassium 3.9, chloride 110, bicarb 22, BUN 6,  creatinine 0.7, glucose 178, calcium 8.4, vitamin B-12 646, TSH 1.353,  RPR NR, WBC 4.8, hemoglobin 13.2, platelets 185.   Over 30 minutes were spent on discharging the patient.      Mliss Sax, MD  Electronically Signed      Madaline Guthrie, M.D.  Electronically Signed    IM/MEDQ  D:  04/17/2009  T:  04/17/2009  Job:  [id number]

## 2011-04-15 ENCOUNTER — Inpatient Hospital Stay (HOSPITAL_COMMUNITY)
Admission: EM | Admit: 2011-04-15 | Discharge: 2011-04-15 | DRG: 637 | Disposition: A | Discharge status: Home / Self Care | Payer: Self-pay | Attending: Internal Medicine | Admitting: Internal Medicine

## 2011-04-15 ENCOUNTER — Emergency Department (HOSPITAL_COMMUNITY): Payer: Self-pay

## 2011-04-15 ENCOUNTER — Inpatient Hospital Stay (HOSPITAL_COMMUNITY)
Admission: EM | Admit: 2011-04-15 | Discharge: 2011-04-18 | Disposition: A | Discharge status: Home / Self Care | Payer: Self-pay | Source: Home / Self Care | Attending: Internal Medicine | Admitting: Internal Medicine

## 2011-04-15 DIAGNOSIS — Z91199 Patient's noncompliance with other medical treatment and regimen due to unspecified reason: Secondary | ICD-10-CM

## 2011-04-15 DIAGNOSIS — E785 Hyperlipidemia, unspecified: Secondary | ICD-10-CM | POA: Diagnosis present

## 2011-04-15 DIAGNOSIS — Z9119 Patient's noncompliance with other medical treatment and regimen: Secondary | ICD-10-CM

## 2011-04-15 DIAGNOSIS — I10 Essential (primary) hypertension: Secondary | ICD-10-CM | POA: Diagnosis present

## 2011-04-15 DIAGNOSIS — N179 Acute kidney failure, unspecified: Secondary | ICD-10-CM | POA: Diagnosis present

## 2011-04-15 DIAGNOSIS — D72829 Elevated white blood cell count, unspecified: Secondary | ICD-10-CM | POA: Diagnosis present

## 2011-04-15 DIAGNOSIS — J189 Pneumonia, unspecified organism: Secondary | ICD-10-CM | POA: Diagnosis present

## 2011-04-15 DIAGNOSIS — Z7982 Long term (current) use of aspirin: Secondary | ICD-10-CM

## 2011-04-15 DIAGNOSIS — E1010 Type 1 diabetes mellitus with ketoacidosis without coma: Principal | ICD-10-CM | POA: Diagnosis present

## 2011-04-15 DIAGNOSIS — Z794 Long term (current) use of insulin: Secondary | ICD-10-CM

## 2011-04-15 LAB — POCT CARDIAC MARKERS
Myoglobin, poc: 74.7 ng/mL (ref 12–200)
Troponin i, poc: <0.05 ng/mL (ref 0.00–0.09)

## 2011-04-15 LAB — POCT I-STAT 3, ART BLOOD GAS (G3+)
O2 Saturation: 98 %
pCO2 arterial: 30.9 mmHg — ABNORMAL LOW (ref 35.0–45.0)
pO2, Arterial: 124 mmHg — ABNORMAL HIGH (ref 80.0–100.0)

## 2011-04-15 LAB — CBC
HCT: 42.5 % (ref 39.0–52.0)
Hemoglobin: 15.6 g/dL (ref 13.0–17.0)
MCH: 29.2 pg (ref 26.0–34.0)
MCHC: 34.6 g/dL (ref 30.0–36.0)
MCV: 88 fL (ref 78.0–100.0)
Platelets: 276 10*3/uL (ref 150–400)
RBC: 5.21 MIL/uL (ref 4.22–5.81)
RBC: 4.83 MIL/uL (ref 4.22–5.81)
WBC: 14.1 10*3/uL — ABNORMAL HIGH (ref 4.0–10.5)

## 2011-04-15 LAB — POCT I-STAT, CHEM 8
HCT: 49 % (ref 39.0–52.0)
Hemoglobin: 16.7 g/dL (ref 13.0–17.0)
Potassium: 4.8 mEq/L (ref 3.5–5.1)
Sodium: 135 mEq/L (ref 135–145)

## 2011-04-15 LAB — COMPREHENSIVE METABOLIC PANEL
AST: 19 U/L (ref 0–37)
CO2: 14 mEq/L — ABNORMAL LOW (ref 19–32)
Calcium: 10.1 mg/dL (ref 8.4–10.5)
Creatinine, Ser: 1.42 mg/dL (ref 0.4–1.5)
GFR calc Af Amer: >60 mL/min (ref >60)
GFR calc non Af Amer: 52 mL/min — ABNORMAL LOW (ref >60)

## 2011-04-15 LAB — POCT I-STAT 3, VENOUS BLOOD GAS (G3P V)
O2 Saturation: 61 %
TCO2: 13 mmol/L (ref 0–100)
pCO2, Ven: 31.9 mmHg — ABNORMAL LOW (ref 45.0–50.0)
pH, Ven: 7.193 — CL (ref 7.250–7.300)
pO2, Ven: 38 mmHg (ref 30.0–45.0)

## 2011-04-15 LAB — DIFFERENTIAL
Basophils Absolute: 0 10*3/uL (ref 0.0–0.1)
Basophils Relative: 0 % (ref 0–1)
Eosinophils Absolute: 0 10*3/uL (ref 0.0–0.7)
Eosinophils Relative: 0 % (ref 0–5)
Lymphocytes Relative: 7 % — ABNORMAL LOW (ref 12–46)
Lymphs Abs: 1 10*3/uL (ref 0.7–4.0)
Monocytes Relative: 6 % (ref 3–12)
Neutro Abs: 13 10*3/uL — ABNORMAL HIGH (ref 1.7–7.7)
Neutrophils Relative %: 92 % — ABNORMAL HIGH (ref 43–77)

## 2011-04-15 LAB — GLUCOSE, CAPILLARY
Glucose-Capillary: 550 mg/dL — ABNORMAL HIGH (ref 70–99)
Glucose-Capillary: 337 mg/dL — ABNORMAL HIGH (ref 70–99)
Glucose-Capillary: 325 mg/dL — ABNORMAL HIGH (ref 70–99)

## 2011-04-15 LAB — URINALYSIS, ROUTINE W REFLEX MICROSCOPIC
Bilirubin Urine: NEGATIVE
Ketones, ur: >80 mg/dL — AB
Nitrite: NEGATIVE
Protein, ur: 30 mg/dL — AB
Specific Gravity, Urine: 1.028 (ref 1.005–1.030)
Urobilinogen, UA: 0.2 mg/dL (ref 0.0–1.0)

## 2011-04-15 LAB — TROPONIN I: Troponin I: <0.3 ng/mL (ref <0.30)

## 2011-04-15 LAB — MRSA PCR SCREENING: MRSA by PCR: NEGATIVE

## 2011-04-16 LAB — BASIC METABOLIC PANEL
BUN: 22 mg/dL (ref 6–23)
BUN: 18 mg/dL (ref 6–23)
CO2: 17 mEq/L — ABNORMAL LOW (ref 19–32)
Calcium: 9 mg/dL (ref 8.4–10.5)
Chloride: 108 mEq/L (ref 96–112)
Creatinine, Ser: 1.38 mg/dL (ref 0.4–1.5)
Creatinine, Ser: 1.24 mg/dL (ref 0.4–1.5)
GFR calc Af Amer: >60 mL/min (ref >60)
GFR calc Af Amer: >60 mL/min (ref >60)
GFR calc non Af Amer: 54 mL/min — ABNORMAL LOW (ref >60)
GFR calc non Af Amer: >60 mL/min (ref >60)
GFR calc non Af Amer: >60 mL/min (ref >60)
Glucose, Bld: 199 mg/dL — ABNORMAL HIGH (ref 70–99)
Potassium: 4.1 mEq/L (ref 3.5–5.1)
Potassium: 4.2 mEq/L (ref 3.5–5.1)
Sodium: 144 mEq/L (ref 135–145)
Sodium: 139 mEq/L (ref 135–145)

## 2011-04-16 LAB — CBC
HCT: 40.4 % (ref 39.0–52.0)
Hemoglobin: 13.9 g/dL (ref 13.0–17.0)
MCHC: 34.4 g/dL (ref 30.0–36.0)
MCV: 88.6 fL (ref 78.0–100.0)
Platelets: 287 10*3/uL (ref 150–400)
RBC: 4.66 MIL/uL (ref 4.22–5.81)
RBC: 4.63 MIL/uL (ref 4.22–5.81)
RDW: 13.3 % (ref 11.5–15.5)
WBC: 21.6 10*3/uL — ABNORMAL HIGH (ref 4.0–10.5)
WBC: 21.7 10*3/uL — ABNORMAL HIGH (ref 4.0–10.5)

## 2011-04-16 LAB — COMPREHENSIVE METABOLIC PANEL
ALT: 11 U/L (ref 0–53)
AST: 13 U/L (ref 0–37)
Albumin: 3.6 g/dL (ref 3.5–5.2)
Alkaline Phosphatase: 106 U/L (ref 39–117)
Chloride: 103 mEq/L (ref 96–112)
GFR calc Af Amer: >60 mL/min (ref >60)
Potassium: 5.6 mEq/L — ABNORMAL HIGH (ref 3.5–5.1)
Sodium: 139 mEq/L (ref 135–145)
Total Bilirubin: 0.6 mg/dL (ref 0.3–1.2)
Total Protein: 6.7 g/dL (ref 6.0–8.3)

## 2011-04-16 LAB — DIFFERENTIAL
Basophils Absolute: 0 10*3/uL (ref 0.0–0.1)
Eosinophils Absolute: 0 10*3/uL (ref 0.0–0.7)
Eosinophils Relative: 0 % (ref 0–5)
Lymphocytes Relative: 5 % — ABNORMAL LOW (ref 12–46)
Lymphs Abs: 1 10*3/uL (ref 0.7–4.0)
Neutrophils Relative %: 91 % — ABNORMAL HIGH (ref 43–77)

## 2011-04-16 LAB — GLUCOSE, CAPILLARY
Glucose-Capillary: 392 mg/dL — ABNORMAL HIGH (ref 70–99)
Glucose-Capillary: 393 mg/dL — ABNORMAL HIGH (ref 70–99)
Glucose-Capillary: 402 mg/dL — ABNORMAL HIGH (ref 70–99)
Glucose-Capillary: 227 mg/dL — ABNORMAL HIGH (ref 70–99)
Glucose-Capillary: 244 mg/dL — ABNORMAL HIGH (ref 70–99)
Glucose-Capillary: 184 mg/dL — ABNORMAL HIGH (ref 70–99)
Glucose-Capillary: 191 mg/dL — ABNORMAL HIGH (ref 70–99)
Glucose-Capillary: 195 mg/dL — ABNORMAL HIGH (ref 70–99)
Glucose-Capillary: 137 mg/dL — ABNORMAL HIGH (ref 70–99)
Glucose-Capillary: 82 mg/dL (ref 70–99)

## 2011-04-16 LAB — URINALYSIS, ROUTINE W REFLEX MICROSCOPIC
Bilirubin Urine: NEGATIVE
Glucose, UA: >1000 mg/dL — AB
Ketones, ur: >80 mg/dL — AB
Nitrite: NEGATIVE
Protein, ur: NEGATIVE mg/dL
pH: 5.5 (ref 5.0–8.0)

## 2011-04-16 LAB — MRSA PCR SCREENING: MRSA by PCR: NEGATIVE

## 2011-04-16 LAB — CK TOTAL AND CKMB (NOT AT ARMC)
CK, MB: 1.7 ng/mL (ref 0.3–4.0)
Total CK: 49 U/L (ref 7–232)

## 2011-04-16 LAB — LIPID PANEL
Cholesterol: 210 mg/dL — ABNORMAL HIGH (ref 0–200)
Total CHOL/HDL Ratio: 4 RATIO
VLDL: 19 mg/dL (ref 0–40)

## 2011-04-16 LAB — CARDIAC PANEL(CRET KIN+CKTOT+MB+TROPI)
CK, MB: 2 ng/mL (ref 0.3–4.0)
Relative Index: INVALID (ref 0.0–2.5)
Total CK: 61 U/L (ref 7–232)
Troponin I: <0.3 ng/mL (ref <0.30)

## 2011-04-16 LAB — POCT I-STAT 3, ART BLOOD GAS (G3+)
O2 Saturation: 96 %
TCO2: 9 mmol/L (ref 0–100)
pCO2 arterial: 22.2 mmHg — ABNORMAL LOW (ref 35.0–45.0)
pH, Arterial: 7.193 — CL (ref 7.350–7.450)
pO2, Arterial: 95 mmHg (ref 80.0–100.0)

## 2011-04-16 LAB — TROPONIN I: Troponin I: <0.3 ng/mL (ref <0.30)

## 2011-04-16 LAB — HEMOGLOBIN A1C: Hgb A1c MFr Bld: 10.4 % — ABNORMAL HIGH (ref <5.7)

## 2011-04-17 LAB — BASIC METABOLIC PANEL
BUN: 12 mg/dL (ref 6–23)
BUN: 10 mg/dL (ref 6–23)
CO2: 19 mEq/L (ref 19–32)
CO2: 19 mEq/L (ref 19–32)
CO2: 20 mEq/L (ref 19–32)
Calcium: 8.3 mg/dL — ABNORMAL LOW (ref 8.4–10.5)
Chloride: 111 mEq/L (ref 96–112)
Chloride: 111 mEq/L (ref 96–112)
Creatinine, Ser: 0.97 mg/dL (ref 0.4–1.5)
Creatinine, Ser: 0.97 mg/dL (ref 0.4–1.5)
Creatinine, Ser: 0.89 mg/dL (ref 0.4–1.5)
GFR calc Af Amer: >60 mL/min (ref >60)
GFR calc non Af Amer: >60 mL/min (ref >60)
Glucose, Bld: 283 mg/dL — ABNORMAL HIGH (ref 70–99)
Glucose, Bld: 157 mg/dL — ABNORMAL HIGH (ref 70–99)
Sodium: 139 mEq/L (ref 135–145)

## 2011-04-17 LAB — URINE DRUGS OF ABUSE SCREEN W ALC, ROUTINE (REF LAB)
Amphetamine Screen, Ur: NEGATIVE
Cocaine Metabolites: NEGATIVE
Creatinine,U: 74.7 mg/dL
Marijuana Metabolite: NEGATIVE
Methadone: NEGATIVE
Opiate Screen, Urine: NEGATIVE
Propoxyphene: NEGATIVE

## 2011-04-17 LAB — GLUCOSE, CAPILLARY: Glucose-Capillary: 265 mg/dL — ABNORMAL HIGH (ref 70–99)

## 2011-04-17 NOTE — Discharge Summary (Signed)
NAMEEWARD, [name] [name]                 ACCOUNT NO.:  [id number]   MEDICAL RECORD NO.:  [id number]          PATIENT TYPE:  INP   LOCATION:  5733                         FACILITY:  [facility]   PHYSICIAN:  [name] [name], M.D.DATE OF BIRTH:  05/03/1956   DATE OF ADMISSION:  08/08/2006  DATE OF DISCHARGE:                                 DISCHARGE SUMMARY   DIAGNOSES:  1. Nausea, vomiting, generalized weakness secondary to severe      hyperglycemia from uncontrolled diabetes mellitus.  2. Diabetes mellitus.  3. Gastroesophageal reflux disease.  4. Dyslipidemia.   DISCHARGE MEDICATIONS:  1. Novolin 70/30 at 30 units subcutaneous injection b.i.d.  2. NovoLog sliding scale insulin.  3. Lipitor 40 mg daily.  4. Prevacid 40 mg daily.   DISCHARGE LABS:  WBC count 10.9, hemoglobin 15.4, hematocrit of 44.5,  platelet count 282, sodium 106, potassium 4.3, chloride 104, CO2 at 17.  The  patient's admission labs on August 08, 2006, was repeated.  However, of  note the patient's fingerstick glucose on discharge was 133 mg per  deciliter.  __________ .  The patient was admitted by Dr. Rito Ehrlich after  presenting with nausea and vomiting.  He had run out his insulin.  There is  no history of chest pain, shortness of breath.  There was no dysuria, fever,  or chills.  He had been feeling weak.  On admission, the patient's lab work  indicated hyponatremia of 127 with bicarbonate of 15.  He was admitted for  nonketotic hyperglycemia by Dr. Rito Ehrlich.  He was started on glucomander  protocol until his glucose came to below 200 mg per deciliter, at which time  he was switched to subcutaneous insulin.  His renal function was also  suboptimal and received hydration.  The patient has been doing well over the  last 24 hours on subcutaneous injections.  His weakness, nausea and vomiting  have all resolved with treatment and therefore will be discharged  today.__________  denies any fever, chills, shortness of  breath.   His discharge vital signs indicate blood pressure 116/62, pulse 74,  respirations 20, temperature 97.7 degrees F, O2 saturation 95% on room air.  His CBG this morning was 143 mg per deciliter.  His cardiopulmonary exam in  unremarkable.  __________ he does not have any evidence of __________  hydration.  __________ no cyanosis and is planned for discharge today for  which this dictation is being done.  Routine evaluation with basic metabolic  panel followup.  Please note that the patient's renal function was notable  for BUN of 25, creatinine of 1.8 soon after admission and therefore we would  like to get a repeat lab to follow up on this.  The patient is to follow up  with primary care physician and I have asked him to __________ medication  before he leaves the hospital.     [name] [name], M.D.  Electronically Signed    GO/MEDQ  D:  08/10/2006  T:  08/10/2006  Job:  [id number]

## 2011-04-17 NOTE — H&P (Signed)
NAMEMONTREAL, STEIDLE NO.:  [id number]   MEDICAL RECORD NO.:  [id number]          PATIENT TYPE:  EMS   LOCATION:  MAJO                         FACILITY:  [facility]   PHYSICIAN:  Jackie Plum, M.D.DATE OF BIRTH:  Jun 04, 1956   DATE OF ADMISSION:  08/08/2006  DATE OF DISCHARGE:                                HISTORY & PHYSICAL   The patient has no primary care physician.   CHIEF COMPLAINT:  Nausea, vomiting, and weakness.   HISTORY OF PRESENT ILLNESS:  The patient is a 55 year old, African American  male with past medical history of diabetes mellitus x10 years, GERD, and  hyperlipidemia, who presents to the emergency room after several days of  nausea, vomiting, and weakness.  He previously was diagnosed with diabetes  approximately 10 years ago in [place], [place].  Although he has been on the  entire time, given his age and his heaviness, this appears to be much more  likely type 2 diabetes treated with insulin.  According to his wife, he has  flare-ups of his hyperglycemia now and then.  He was also diagnosed with  GERD and hyperlipidemia over the past few years, but has not been able to  afford all of his medications.  He recently moved approximately a few weeks  ago to [facility] and is not yet established with a PCP.  He has been in the  process of setting up moving, when all of a sudden he was feeling rough,  with nausea and vomiting, and after a day came into the emergency room.  In  the emergency room, the patient was noted to have a glucose greater than  700.  However, a BMET was done, and he was actually found to have no anion  gap.  His wife thought that he must be coming down with a bug.  The patient  tells me that he has been continuing to take his insulin this entire time.  CBC and UA are pending.  The patient was started on an insulin Glucommander.  Currently, he says he still feels rough.  Denies any headache, but does  complain of some mild  blurry vision.  Denies any dysphagia, but does  complain of a burning in his stomach, but he denies any chest pain, no  palpitations, no shortness of breath, no wheeze, cough, no other type of  abdominal pain, no hematuria, dysuria, constipation, diarrhea, focal  extremity numbness, weakness, or pain.  He does complain of nausea and  vomiting.  Review of systems otherwise negative.   PAST MEDICAL HISTORY:  1. Diabetes mellitus, suspected to be type 2, but always on insulin.  2. Reflux.  3. Hyperlipidemia.   MEDICATIONS:  Insulin 70/30, 30 units twice a day, plus sliding scale.   ALLERGIES:  He has no known drug allergies.   SOCIAL HISTORY:  No tobacco, alcohol, or drug use.   FAMILY HISTORY:  Noncontributory.   PHYSICAL EXAMINATION:  VITAL SIGNS:  Temperature 97.3, heart rate 95, blood  pressure 109/64, respirations 20, O2 saturation 95% on room air.  GENERAL:  The patient is alert  and oriented, but quite fatigued.  HEENT:  Normocephalic, atraumatic.  His mucous membranes are dry.  NECK:  He has no carotid bruits.  HEART:  Regular rate and rhythm.  S1, S2.  LUNGS:  Clear to auscultation bilaterally.  ABDOMEN:  Soft.  Some diffuse tenderness.  Mild distention, but positive  bowel sounds.  EXTREMITIES:  No clubbing, cyanosis, or edema.   LABORATORY DATA:  CBC and UA are pending.  Sodium 127, potassium 4.9,  chloride 99, bicarbonate 15, BUN 26, creatinine 1.4, glucose greater than  700.   ASSESSMENT AND PLAN:  1. Nonketotic hyperglycemia, most likely in a type 2 diabetic.  Given that      insulin Glucommander has already been started, we will aggressively      hydrate, plus continue insulin Glucommander.  Will not follow DKA      protocol, and rather just simple hyperglycemia protocol.  Once he gets      below 200, will take him off, will encourage p.o., start him on sliding      scale, plus his normal insulin.  Will also check hemoglobin A1c.  2. Acute renal failure secondary  to dehydration from hyperglycemia.  Will      continue to aggressively hydrate.  3. Hyponatremia.  This is actually sero-hyponatremia secondary to his high      sugars.  When corrected, his sodium is actually within the normal      range.  4. Gastroesophageal reflux disease.  Will put on IV Protonix until he is      p.o.  5. Hyperlipidemia.  The patient needs to be started on a statin.  Will      check a fasting lipid profile prior to discharge as well as set up the      patient with a PCP.      Hollice Espy, M.D.   Electronically Signed     ______________________________  Jackie Plum, M.D.    SKK/MEDQ  D:  08/08/2006  T:  08/08/2006  Job:  [id number]

## 2011-04-18 LAB — BASIC METABOLIC PANEL
BUN: 9 mg/dL (ref 6–23)
Creatinine, Ser: 0.84 mg/dL (ref 0.4–1.5)
GFR calc non Af Amer: >60 mL/min (ref >60)
Glucose, Bld: 212 mg/dL — ABNORMAL HIGH (ref 70–99)
Potassium: 3.3 mEq/L — ABNORMAL LOW (ref 3.5–5.1)

## 2011-04-18 LAB — CBC
HCT: 35.6 % — ABNORMAL LOW (ref 39.0–52.0)
Hemoglobin: 12.8 g/dL — ABNORMAL LOW (ref 13.0–17.0)
MCV: 83.4 fL (ref 78.0–100.0)
Platelets: 207 10*3/uL (ref 150–400)
RBC: 4.27 MIL/uL (ref 4.22–5.81)
WBC: 5.7 10*3/uL (ref 4.0–10.5)

## 2011-04-18 NOTE — Discharge Summary (Signed)
[name] [name], [name] [name]                 ACCOUNT NO.:  [id number]  MEDICAL RECORD NO.:  [id number]           PATIENT TYPE:  I  LOCATION:  3301                         FACILITY:  [facility]  PHYSICIAN:  [name] [name], M.D.DATE OF BIRTH:  1956/01/25  DATE OF ADMISSION:  04/16/2011 DATE OF DISCHARGE:  04/18/2011                              DISCHARGE SUMMARY   PRIMARY CARE PHYSICIAN:  [name] Raider, MD  DISCHARGE DIAGNOSES: 1. Diabetes mellitus type 1 with diabetic ketoacidosis.     a.     Suspected noncompliance with insulin regimen.     b.     The patient insisted to be discharged before full diabetes      education could be completed.     c.     Diabetic ketoacidosis resolved rapidly with insulin      treatment.     d.     CBGs currently reasonably controlled. 2. Diabetes mellitus type 1.     a.     Suspected poor compliance with medical therapy.     b.     ACE inhibitor not initiated during this hospital stay due to      noncompliance issues and concerned that the patient would not      follow up.     c.     Continue aspirin and current medications.     d.     The patient counseled extensively as to the absolute need      for strict compliance and risk of death with noncompliance due to      diabetic ketoacidosis. 3. Reactive leukocytosis - the patient refused followup eval prior to     discharge.  DISCHARGE MEDICATIONS: 1. Aspirin 81 mg p.o. daily. 2. Humulin R 8-10 units per sliding scale twice a day as per the     patient's previous scale. 3. Clonazepam 2 mg p.o. nightly p.r.n. insomnia. 4. Lantus insulin 40 units subcu nightly. 5. Protonix 20 mg by mouth b.i.d.  FOLLOWUP:  This is presently Saturday, and therefore specific followup arrangements cannot be made.  The patient is instructed to contact Dr. Alver [name] office for followup appointment within 5-7 days.  The absolute importance of close followup is impressed upon him twice prior to his discharge.  Initiation of an  ACE inhibitor empirically in this diabetic should be considered once the patient has established a pattern of ongoing followup with Dr. Clelia [name].  PROCEDURES:  None.  CONSULTATIONS:  None  HOSPITAL COURSE:  [name] is a 55 year old gentleman with diabetes mellitus type 1.  He has a history of being admitted to hospital with DKA.  He initially presented to the emergency room on Apr 15, 2011.  He was evaluated by one of my partners, noted to be in DKA, and arrangements were made for admission.  Prior to the patient being able to be admitted to the hospital, however, he decided to leave the hospital AMA.  There was concern at that time that the patient could have been suffering with a community-acquired pneumonia.  Unfortunately, the patient represented to the emergency room on Apr 15, 2009  and I did at that time submit to admission.  The patient was placed in the acute units.  Followup chest x-rays were accomplished and proved to reveal no evidence of community-acquired pneumonia.  The patient was, however, still in profound DKA.  The patient was lethargic.  The patient's DKA was treated as per the usual regimen to include insulin via IV and frequent BMET checks.  Potassium was repleted as indicated.  The patient's anion gap closed.  His bicarb normalized.  The patient's CBGs improved significantly.  The patient remained lethargic and so was monitored in the hospital.  Avelox had been initiated and the patient completed 3 complete doses.  It was not felt that ongoing antibiotic therapy, however, was needed.  On the night of May 18 leading into May 19, the patient discontinued his on telemetry, pulled out his own IV and refused any further medical treatment.  He initially stated that he was leaving AMA, but staff was able to convince him to stay.  He did have refused any followup labs.  As a result of, followup bicarbs and anion gaps could not be calculated.  Followup white count  could also not be assessed.  PHYSICAL EXAMINATION:  On the day that the patient's discharged, I have examined him. VITAL SIGNS:  Stable and he is afebrile. LUNGS:  Clear. HEART:  His heart rate is regular with no murmur, gallop, or rub. ABDOMEN:  Unrevealing with positive bowel sounds.  No mass.  No rebound and no ascites.  EXTREMITIES:  There was no significant lower extremity cyanosis, clubbing, or edema. NEUROLOGIC:  The patient is much more alert today and his lethargy is cleared.  I have counseled him extensively as to the absolute need to adhere very close to his insulin regimen.  I have counseled him very clearly in a very real risk of death with uncontrolled recurring DKA.  I have counseled him on the absolute need for very strict close monitoring by his primary care physician in the outpatient setting.  The patient persists and he is desired to be discharged home.  Unfortunately, we, therefore, are not able to complete further diabetic education as an inpatient.  The patient is already on an aspirin.  I would like to add an ACE inhibitor by a concern that the patient will not follow up regularly and do not wish to deleteriously effect his potassium level or renal function without feeling competent that it can be monitored closely.  As a result, I will not start an ACE inhibitor now, but was suggested this be accomplished in the outpatient setting if the patient is able to prove that he can be relied upon for followup.     [name] [name], M.D.     JTM/MEDQ  D:  04/18/2011  T:  04/18/2011  Job:  [id number]  cc:   [name] [name], M.D.  Electronically Signed by [name] [name] M.D. on 04/18/2011 03:11:24 PM

## 2011-04-20 LAB — GLUCOSE, CAPILLARY: Glucose-Capillary: 123 mg/dL — ABNORMAL HIGH (ref 70–99)

## 2011-04-21 LAB — CULTURE, BLOOD (ROUTINE X 2)
Culture  Setup Time: 201205161634
Culture: NO GROWTH

## 2011-04-22 LAB — CULTURE, BLOOD (ROUTINE X 2)
Culture  Setup Time: 201205170925
Culture: NO GROWTH
Culture: NO GROWTH

## 2011-04-23 NOTE — H&P (Signed)
NAMEDELOS, [name] NO.:  [id number]  MEDICAL RECORD NO.:  [id number]           PATIENT TYPE:  LOCATION:                                 FACILITY:  PHYSICIAN:  Eduard Clos, MDDATE OF BIRTH:  May 13, 1956  DATE OF ADMISSION: DATE OF DISCHARGE:                             HISTORY & PHYSICAL   PRIMARY CARE PHYSICIAN:  [name] Raider, MD, of [facility].  CHIEF COMPLAINT:  Increased blood sugar and weakness.  HISTORY OF PRESENT ILLNESS:  A 55 year old male with known history of diabetes mellitus type 1 who had come yesterday to the ER with weakness and was found to be in DKA, signed out AMA, comes back again because his blood sugar was still high and feeling weak.  At this time, he also has chest pressure which he is not able to correctly characterize.  He is presently chest pain free.  He also had some cough for last 2 days. Denies any nausea, vomiting, abdominal pain, dysuria, discharge, diarrhea, any focal deficits, losing consciousness.  No headache. Denies any visual symptoms.  In the ER, his anion gap is found to be around 25 with ABG showing pH of 7.1.  The patient will be admitted to ICU for IV insulin and hydration.  PAST MEDICAL HISTORY:  Diabetes mellitus type 1.  MEDICATIONS PRIOR TO ADMISSION:  Lantus insulin 45 units at bedtime and NovoLog sliding scale, this has to be verified.  ALLERGIES:  No known drug allergies.  SOCIAL HISTORY:  The patient is married, lives with his wife.  Denies smoking, drinking alcohol, using illegal drugs.  FAMILY HISTORY:  Positive for diabetes in brother and father died of throat cancer.  REVIEW OF SYSTEMS:  As per history of present illness, nothing else significant.  PHYSICAL EXAMINATION:  GENERAL:  The patient examined at bedside not in acute distress. VITAL SIGNS:  Blood pressure 150/67, pulse is 112 per minute, temperature 99.3, respiration 20 per minute, O2 sat 100%. HEENT:   Anicteric.  No pallor.  His tongue is midline.  I do not see any tenderness in the jugular digastric area and there is no exudate in the throat.  PLA positive.  No neck rigidity. CHEST:  Bilateral air entry present.  No rhonchi.  No crepitation. HEART:  S1 and S2 heard. ABDOMEN:  Soft, nontender.  Bowel sounds heard. CNS:  The patient is drowsy but easily arousable and oriented x3.  Moves upper and lower extremities 5/5. EXTREMITIES:  Peripheral pulses felt.  No edema.  LABORATORY DATA:  ABG shows a pH of 7.19, pCO2 of 22.2.  Calculated bicarb is .  CBC, WBC is 15.9, hemoglobin is 14.1, hematocrit is 42.5, platelets 273, neutrophils 88%.  Complete metabolic panel, sodium 139, potassium 5.6, chloride 103, carbon dioxide 11, anion gap is 25, glucose 536, BUN 28, creatinine 1.4.  Total bilirubin is 0.6, alk phos 106, AST 13, ALT 11, total protein 6.3, albumin 3.6, calcium 9.1, lipase 8.  CK is 41, CK-MB is 1.3, troponin less than 0.3.  UA has been ordered.  Drug screen also has been ordered.  EKG shows  normal sinus rhythm with heart rate around 100 beats per minute.  There are nonspecific ST-T changes.  ASSESSMENT: 1. Diabetic ketoacidosis. 2. Chest pain. 3. Possible bronchitis with pneumonia. 4. Dehydration.  PLAN: 1. At this time, we will admit the patient. 2. For his DKA, the patient will be placed on IV fluids, IV insulin     drip for DKA protocol.  Frequent BMET checks. 3. For his possible bronchitis with pneumonia, we will get blood     cultures.  We will place the patient on Avelox. 4. Chest pain.  At this time, we will cycle cardiac markers. 5. Further recommendation as condition evolves.     Eduard Clos, MD     ANK/MEDQ  D:  04/16/2011  T:  04/16/2011  Job:  [id number]  Electronically Signed by Midge Minium MD on 04/23/2011 06:22:31 AM

## 2011-04-25 NOTE — Discharge Summary (Signed)
  NAMEWES, LEZOTTE NO.:  [id number]  MEDICAL RECORD NO.:  [id number]           PATIENT TYPE:  I  LOCATION:  2608                         FACILITY:  [facility]  PHYSICIAN:  Lonia Blood, M.D.       DATE OF BIRTH:  January 19, 1956  DATE OF ADMISSION:  04/15/2011 DATE OF DISCHARGE:  04/15/2011                              DISCHARGE SUMMARY   PRIMARY CARE PHYSICIAN:  Lupita Raider, MD  DISCHARGE DIAGNOSES: 1. Diabetic ketoacidosis. 2. Diabetes mellitus type 1, uncontrolled.  CONDITION ON DISCHARGE:  Mr. Strohmeier requested to be discharged against medical advice on Apr 15, 2011.  We were unable to persuade him to stay in the hospital despite the fact that he continued to be in diabetic ketoacidosis.  PROCEDURE THIS ADMISSION:  The patient underwent chest x-ray PA and lateral, findings of no active cardiopulmonary disease.  CONSULTATION THIS ADMISSION:  No consultation obtained.  HISTORY AND PHYSICAL:  Refer dictation H and P done by Dr. Lavera Guise.  HOSPITAL COURSE:  Mr. Kotarski is a 55 year old gentleman with diabetes minutes type 1, presented to emergency room with severe diabetic ketoacidosis.  After a couple of liters of IV fluids and an insulin drip, the patient perked up and requested to be discharged home against medical advice.  I personally went upstairs and informed him of the fact that he was still in diabetic ketoacidosis and I doubted that he can last at home.  The patient though informed me that he really does not care what I have to say.  He pulled out his IV, called his wife, got in the car and went home.     Lonia Blood, M.D.     SL/MEDQ  D:  04/16/2011  T:  04/17/2011  Job:  [id number]  Electronically Signed by Lonia Blood M.D. on 04/25/2011 08:14:32 AM

## 2011-04-25 NOTE — H&P (Addendum)
NAMEKENDEN, BRANDT NO.:  [id number]  MEDICAL RECORD NO.:  [id number]           PATIENT TYPE:  E  LOCATION:  [facility]                         FACILITY:  [facility]  PHYSICIAN:  Lonia Blood, M.D.       DATE OF BIRTH:  09/26/1956  DATE OF ADMISSION:  04/15/2011 DATE OF DISCHARGE:                             HISTORY & PHYSICAL   PRIMARY CARE PHYSICIAN:  Lupita Raider, MD with [facility].  CHIEF COMPLAINT:  Lethargic.  HISTORY OF PRESENT ILLNESS:  Mr. Marasigan is a 55 year old gentleman with history of diabetes mellitus type 1, hypertension, hyperlipidemia, and multiple hospitalizations for DKA who was brought in by his wife today after he was found to be lethargic at home.  In the emergency room, he was diagnosed with diabetic ketoacidosis and were called to admit him. According to the patient, he is currently feeling very tired.  He has been having a cold with a cough for the past week.  He does not know if had a fever or chills.  There is no sputum production.  PAST MEDICAL HISTORY: 1. Diabetes mellitus type 1. 2. Hypertension. 3. Hyperlipidemia.  ALLERGIES:  No known drug allergies.  HOME MEDICATIONS:  Lantus and NovoLog.  SOCIAL HISTORY:  The patient is married, lives with his wife.  Denies drinking alcohol or smoking tobacco.  FAMILY HISTORY:  Positive for diabetes in a brother and father died of throat cancer.  REVIEW OF SYSTEMS:  Difficult to obtain as the patient is very lethargic, but there is no diarrhea.  There has been some nausea, vomiting associated with the DKA.  No significant abdominal pain.  No suspicious looking rashes.  No headaches.  Otherwise per HPI, negative.  PHYSICAL EXAMINATION:  VITAL SIGNS:  Upon admission, temperature is 98.2, blood pressure 100/60, pulse 109, respirations 24, saturation 97% on room air. GENERAL:  The patient is lethargic, but arousable. HEENT:  Head is normocephalic, atraumatic.  Eyes:  Pupils equal,  round, and reactive to light.  Extraocular movements intact.  Throat clear. NECK:  Supple.  No JVD. CHEST:  Clear to auscultation without wheezes, rhonchi, or crackles. HEART:  Regular rate and rhythm without murmurs, rubs, or gallops. ABDOMEN:  Soft, nontender.  Bowel sounds are present. EXTREMITIES:  Lower extremity without edema. SKIN:  Warm, dry.  No suspicious looking rashes.  LABORATORY VALUES:  On admission, white blood cell count is 14,000, hemoglobin 15, platelet count 286.  Sodium is 136, potassium 4.6, chloride 95, bicarbonate 14, glucose 581, BUN 24, creatinine 1.4, alkaline phosphatase 125.  ABG; pH 7.27, pCO2 30, pO2 124.  Portable chest x-ray is pending.  ASSESSMENT AND PLAN:  This is a 55 year old gentleman admitted with diabetic ketoacidosis, most likely etiology is a community-acquired pneumonia.  We will start the patient on intravenous insulin, intravenous fluids, and intravenous antibiotics using Avelox.  We will follow up CBCs every 1 hour and BMET 74 hours.  I will replace electrolytes as needed.  Further plans of care depending on sedation involves.     Lonia Blood, M.D.     SL/MEDQ  D:  04/15/2011  T:  04/15/2011  Job:  [id number]  cc:   Lupita Raider, M.D.  Electronically Signed by Lonia Blood M.D. on 04/25/2011 08:14:43 AM

## 2011-05-09 ENCOUNTER — Inpatient Hospital Stay (HOSPITAL_COMMUNITY)
Admission: EM | Admit: 2011-05-09 | Discharge: 2011-05-15 | DRG: 637 | Disposition: A | Discharge status: Other Acute Inpatient Hospital | Payer: Self-pay | Attending: Internal Medicine | Admitting: Internal Medicine

## 2011-05-09 DIAGNOSIS — E871 Hypo-osmolality and hyponatremia: Secondary | ICD-10-CM | POA: Diagnosis present

## 2011-05-09 DIAGNOSIS — Z794 Long term (current) use of insulin: Secondary | ICD-10-CM

## 2011-05-09 DIAGNOSIS — Z9119 Patient's noncompliance with other medical treatment and regimen: Secondary | ICD-10-CM

## 2011-05-09 DIAGNOSIS — D72829 Elevated white blood cell count, unspecified: Secondary | ICD-10-CM | POA: Diagnosis present

## 2011-05-09 DIAGNOSIS — N179 Acute kidney failure, unspecified: Secondary | ICD-10-CM | POA: Diagnosis present

## 2011-05-09 DIAGNOSIS — G9341 Metabolic encephalopathy: Secondary | ICD-10-CM | POA: Diagnosis present

## 2011-05-09 DIAGNOSIS — I959 Hypotension, unspecified: Secondary | ICD-10-CM | POA: Diagnosis present

## 2011-05-09 DIAGNOSIS — F329 Major depressive disorder, single episode, unspecified: Secondary | ICD-10-CM | POA: Diagnosis present

## 2011-05-09 DIAGNOSIS — E876 Hypokalemia: Secondary | ICD-10-CM | POA: Diagnosis not present

## 2011-05-09 DIAGNOSIS — I129 Hypertensive chronic kidney disease with stage 1 through stage 4 chronic kidney disease, or unspecified chronic kidney disease: Secondary | ICD-10-CM | POA: Diagnosis present

## 2011-05-09 DIAGNOSIS — E1010 Type 1 diabetes mellitus with ketoacidosis without coma: Principal | ICD-10-CM | POA: Diagnosis present

## 2011-05-09 DIAGNOSIS — N183 Chronic kidney disease, stage 3 unspecified: Secondary | ICD-10-CM | POA: Diagnosis present

## 2011-05-09 DIAGNOSIS — Z91199 Patient's noncompliance with other medical treatment and regimen due to unspecified reason: Secondary | ICD-10-CM

## 2011-05-09 DIAGNOSIS — F29 Unspecified psychosis not due to a substance or known physiological condition: Secondary | ICD-10-CM | POA: Diagnosis not present

## 2011-05-09 DIAGNOSIS — F3289 Other specified depressive episodes: Secondary | ICD-10-CM | POA: Diagnosis present

## 2011-05-09 DIAGNOSIS — E875 Hyperkalemia: Secondary | ICD-10-CM | POA: Diagnosis present

## 2011-05-09 LAB — POCT I-STAT 3, ART BLOOD GAS (G3+)
Acid-base deficit: 6 mmol/L — ABNORMAL HIGH (ref 0.0–2.0)
Bicarbonate: 10 mEq/L — ABNORMAL LOW (ref 20.0–24.0)
Bicarbonate: 19.2 mEq/L — ABNORMAL LOW (ref 20.0–24.0)
O2 Saturation: 99 %
Patient temperature: 98.6
pCO2 arterial: 37 mmHg (ref 35.0–45.0)
pH, Arterial: 7.208 — ABNORMAL LOW (ref 7.350–7.450)
pH, Arterial: 7.324 — ABNORMAL LOW (ref 7.350–7.450)
pO2, Arterial: 128 mmHg — ABNORMAL HIGH (ref 80.0–100.0)

## 2011-05-09 LAB — DIFFERENTIAL
Basophils Absolute: 0 10*3/uL (ref 0.0–0.1)
Lymphs Abs: 0.5 10*3/uL — ABNORMAL LOW (ref 0.7–4.0)
Monocytes Absolute: 0.3 10*3/uL (ref 0.1–1.0)
Neutrophils Relative %: 94 % — ABNORMAL HIGH (ref 43–77)
Smear Review: ADEQUATE

## 2011-05-09 LAB — POCT I-STAT 3, VENOUS BLOOD GAS (G3P V)
TCO2: 11 mmol/L (ref 0–100)
pH, Ven: 7.209 — ABNORMAL LOW (ref 7.250–7.300)

## 2011-05-09 LAB — BASIC METABOLIC PANEL
BUN: 33 mg/dL — ABNORMAL HIGH (ref 6–23)
BUN: 33 mg/dL — ABNORMAL HIGH (ref 6–23)
Calcium: 9.3 mg/dL (ref 8.4–10.5)
Calcium: 9.3 mg/dL (ref 8.4–10.5)
Chloride: 109 mEq/L (ref 96–112)
Chloride: 111 mEq/L (ref 96–112)
Creatinine, Ser: 1.35 mg/dL (ref 0.4–1.5)
Creatinine, Ser: 1.09 mg/dL (ref 0.4–1.5)
GFR calc Af Amer: 47 mL/min — ABNORMAL LOW (ref >60)
GFR calc Af Amer: >60 mL/min (ref >60)
GFR calc Af Amer: >60 mL/min (ref >60)
GFR calc Af Amer: >60 mL/min (ref >60)
GFR calc non Af Amer: 39 mL/min — ABNORMAL LOW (ref >60)
GFR calc non Af Amer: 56 mL/min — ABNORMAL LOW (ref >60)
Potassium: 5.9 mEq/L — ABNORMAL HIGH (ref 3.5–5.1)
Potassium: 4.2 mEq/L (ref 3.5–5.1)
Sodium: 134 mEq/L — ABNORMAL LOW (ref 135–145)
Sodium: 145 mEq/L (ref 135–145)

## 2011-05-09 LAB — POCT I-STAT, CHEM 8
Calcium, Ion: 1.05 mmol/L — ABNORMAL LOW (ref 1.12–1.32)
Chloride: 105 mEq/L (ref 96–112)
HCT: 48 % (ref 39.0–52.0)
Sodium: 132 mEq/L — ABNORMAL LOW (ref 135–145)
TCO2: 11 mmol/L (ref 0–100)

## 2011-05-09 LAB — URINALYSIS, ROUTINE W REFLEX MICROSCOPIC
Ketones, ur: >80 mg/dL — AB
Leukocytes, UA: NEGATIVE
Nitrite: NEGATIVE
Protein, ur: NEGATIVE mg/dL
pH: 5.5 (ref 5.0–8.0)

## 2011-05-09 LAB — URINE MICROSCOPIC-ADD ON

## 2011-05-09 LAB — PHOSPHORUS
Phosphorus: 7.3 mg/dL — ABNORMAL HIGH (ref 2.3–4.6)
Phosphorus: 3.4 mg/dL (ref 2.3–4.6)

## 2011-05-09 LAB — GLUCOSE, CAPILLARY
Glucose-Capillary: 108 mg/dL — ABNORMAL HIGH (ref 70–99)
Glucose-Capillary: 125 mg/dL — ABNORMAL HIGH (ref 70–99)
Glucose-Capillary: 140 mg/dL — ABNORMAL HIGH (ref 70–99)
Glucose-Capillary: 216 mg/dL — ABNORMAL HIGH (ref 70–99)
Glucose-Capillary: 260 mg/dL — ABNORMAL HIGH (ref 70–99)
Glucose-Capillary: 425 mg/dL — ABNORMAL HIGH (ref 70–99)
Glucose-Capillary: 449 mg/dL — ABNORMAL HIGH (ref 70–99)
Glucose-Capillary: >600 mg/dL (ref 70–99)

## 2011-05-09 LAB — PROCALCITONIN: Procalcitonin: 3.69 ng/mL

## 2011-05-09 LAB — MAGNESIUM
Magnesium: 2.4 mg/dL (ref 1.5–2.5)
Magnesium: 2.3 mg/dL (ref 1.5–2.5)
Magnesium: 2.2 mg/dL (ref 1.5–2.5)

## 2011-05-09 LAB — MRSA PCR SCREENING: MRSA by PCR: NEGATIVE

## 2011-05-09 LAB — CBC
MCHC: 33.7 g/dL (ref 30.0–36.0)
Platelets: 314 10*3/uL (ref 150–400)
RDW: 13.2 % (ref 11.5–15.5)

## 2011-05-09 NOTE — H&P (Signed)
[name], [name] NO.:  [id number]  MEDICAL RECORD NO.:  [id number]  LOCATION:  [facility]                         FACILITY:  [facility]  PHYSICIAN:  Della Goo, M.D. DATE OF BIRTH:  1956-03-07  DATE OF ADMISSION:  05/09/2011 DATE OF DISCHARGE:                             HISTORY & PHYSICAL   PRIMARY CARE PHYSICIAN: None  CHIEF COMPLAINT:  Decreased responsiveness.  HISTORY OF PRESENT ILLNESS:  This is a 55 year old male with a history of insulin-dependent diabetes mellitus and medical noncompliance due to the loss of his job and medical insurance over the past year, who was  brought to the emergency department with decreased responsiveness over the past 24 hours.  His wife reports that he had been worsening and has  had agitation and confusion during the day and was becoming lethargic.  Mrs. Roger  also states that he has not had his medications in a while.   And the reason he has been out of his medications is because he has  not been able to afford them.    In the emergency department, the patient was evaluated by the EDP.  His fingerstick glucose was found to be critically high at greater than 700.  The basic metabolic panel did reveal a glucose level of 775 with a CO2  level of 11.  The patient also was found to have an elevated BUN and  creatinine and potassium level and phosphate level.  The patient was  also found to be hypotensive.  The patient has had multiple similar admissions for diabetic ketoacidosis in the past.  He was referred to the Medical Service for admission.  PAST MEDICAL HISTORY: 1. Type 1 diabetes mellitus. 2. Hypertension. 3. Dyslipidemia.  MEDICATIONS:  At this time unable to obtain what his previous medications had been.  The patient has not been taking his medications.  ALLERGIES:  No known drug allergies.  SOCIAL HISTORY:  The patient is married, lives with his wife who is  chronically ill at this time.  He has no  history of tobacco, alcohol, or illicit drug usage.  FAMILY HISTORY:  Positive for diabetes in his brother and positive for cancer in his father who died of throat cancer.  REVIEW OF SYSTEMS:  Pertinent as mentioned above.  PHYSICAL EXAMINATION FINDINGS:  GENERAL:  This is a 55 year old obtunded, well-nourished, well-developed African American male who is in acute distress. VITAL SIGNS:  Temperature 97.8, blood pressure is 93/46, heart rate 100- 107, respirations 20, and O2 saturations 95-96%. HEENT:  Normocephalic and atraumatic.  Pupils are equally round and reactive to light.  Extraocular movements are intact.  Funduscopic benign.  There is no scleral icterus.  Nares are patent bilaterally. Oropharynx, unable to visualize secondary to the patient's lack of ability to cooperate at this time. NECK:  Supple.  No thyromegaly, adenopathy, or jugular venous distention. CARDIOVASCULAR:  Tachycardic rate and rhythm.  No murmurs, gallops, or rubs appreciated. LUNGS:  Clear to auscultation bilaterally.  No rales, rhonchi, or wheezes. ABDOMEN:  Positive bowel sounds, soft, nontender, nondistended.  No hepatosplenomegaly. EXTREMITIES:  Without cyanosis, clubbing, or edema. NEUROLOGIC:  The patient is obtunded.  He is minimally  responsive to verbal questions.  He is able to open his eyes, however, he is disoriented.  He is able to move all 4 of his extremities.  Laboratory studies which were performed by the EDP include a chemistry with a sodium level of 134, potassium 5.9, chloride 91, CO2 of 11, BUN 33, creatinine 1.84, and glucose level 775.  A venous blood gas was also performed which revealed a pH of 7.208, a pCO2 of 25, a pO2 of 88, a bicarb of 10.0, and an O2 saturation of 95%.  CBC:  WBC 13.0, hemoglobin 15.6, hematocrit 46.3, platelets 314, and neutrophils 94%.  ASSESSMENT:  This is a 55 year old male being admitted with: 1. Diabetic ketoacidosis. 2. Metabolic encephalopathy  secondary to diabetic ketoacidosis. 3. Hypotension. 4. Hyperkalemia. 5. Acute renal failure. 6. Chronic kidney disease, stage III. 7. Hyperphosphatemia. 8. Hyponatremia. 9. Noncompliance due to Finances.  PLAN:  The patient will be admitted to the ICU secondary to the DKA, hypotension, and his obtundation.  Aggressive IV fluids have been ordered and the patient will be placed on the IV insulin drip protocol. The patient will be transitioned to sliding-scale insulin coverage and his Lantus therapy will be restarted.  His electrolytes will be monitored while he is on the insulin drip and corrected further as needed.  A social work consultation will be requested to assist this  patient with availbale resources for care and medications in this county.  DVT prophylaxis has been ordered, and the patient is a full code at this time.     Della Goo, M.D.     HJ/MEDQ  D:  05/09/2011  T:  05/09/2011  Job:  [id number]  cc:   Lupita Raider, M.D.  Electronically Signed by Della Goo M.D. on 05/09/2011 06:10:56 AM

## 2011-05-10 ENCOUNTER — Inpatient Hospital Stay (HOSPITAL_COMMUNITY): Payer: Self-pay

## 2011-05-10 LAB — BASIC METABOLIC PANEL
BUN: 20 mg/dL (ref 6–23)
CO2: 21 mEq/L (ref 19–32)
Chloride: 113 mEq/L — ABNORMAL HIGH (ref 96–112)
Creatinine, Ser: 1.02 mg/dL (ref 0.4–1.5)
Glucose, Bld: 163 mg/dL — ABNORMAL HIGH (ref 70–99)

## 2011-05-10 LAB — GLUCOSE, CAPILLARY
Glucose-Capillary: 149 mg/dL — ABNORMAL HIGH (ref 70–99)
Glucose-Capillary: 197 mg/dL — ABNORMAL HIGH (ref 70–99)
Glucose-Capillary: 181 mg/dL — ABNORMAL HIGH (ref 70–99)
Glucose-Capillary: 162 mg/dL — ABNORMAL HIGH (ref 70–99)
Glucose-Capillary: 167 mg/dL — ABNORMAL HIGH (ref 70–99)
Glucose-Capillary: 110 mg/dL — ABNORMAL HIGH (ref 70–99)
Glucose-Capillary: 118 mg/dL — ABNORMAL HIGH (ref 70–99)
Glucose-Capillary: 165 mg/dL — ABNORMAL HIGH (ref 70–99)
Glucose-Capillary: 195 mg/dL — ABNORMAL HIGH (ref 70–99)
Glucose-Capillary: 177 mg/dL — ABNORMAL HIGH (ref 70–99)

## 2011-05-10 LAB — PROTIME-INR
INR: 1.07 (ref 0.00–1.49)
Prothrombin Time: 14.1 seconds (ref 11.6–15.2)

## 2011-05-10 LAB — URINE CULTURE

## 2011-05-10 LAB — CBC
MCH: 29.8 pg (ref 26.0–34.0)
MCHC: 34.1 g/dL (ref 30.0–36.0)
RDW: 13.5 % (ref 11.5–15.5)

## 2011-05-11 LAB — GLUCOSE, CAPILLARY
Glucose-Capillary: 172 mg/dL — ABNORMAL HIGH (ref 70–99)
Glucose-Capillary: 210 mg/dL — ABNORMAL HIGH (ref 70–99)
Glucose-Capillary: 213 mg/dL — ABNORMAL HIGH (ref 70–99)
Glucose-Capillary: >600 mg/dL (ref 70–99)

## 2011-05-11 LAB — BASIC METABOLIC PANEL
BUN: 12 mg/dL (ref 6–23)
CO2: 24 mEq/L (ref 19–32)
Chloride: 111 mEq/L (ref 96–112)
Glucose, Bld: 196 mg/dL — ABNORMAL HIGH (ref 70–99)
Potassium: 3.8 mEq/L (ref 3.5–5.1)
Sodium: 143 mEq/L (ref 135–145)

## 2011-05-11 LAB — DIFFERENTIAL
Basophils Absolute: 0 10*3/uL (ref 0.0–0.1)
Basophils Relative: 0 % (ref 0–1)
Lymphocytes Relative: 19 % (ref 12–46)
Monocytes Absolute: 0.7 10*3/uL (ref 0.1–1.0)
Neutro Abs: 6 10*3/uL (ref 1.7–7.7)
Neutrophils Relative %: 73 % (ref 43–77)

## 2011-05-11 LAB — CBC
HCT: 38.8 % — ABNORMAL LOW (ref 39.0–52.0)
Hemoglobin: 13.7 g/dL (ref 13.0–17.0)
MCHC: 35.3 g/dL (ref 30.0–36.0)
RBC: 4.46 MIL/uL (ref 4.22–5.81)

## 2011-05-12 DIAGNOSIS — F39 Unspecified mood [affective] disorder: Secondary | ICD-10-CM

## 2011-05-12 LAB — BASIC METABOLIC PANEL
BUN: 12 mg/dL (ref 6–23)
CO2: 25 mEq/L (ref 19–32)
Calcium: 8.5 mg/dL (ref 8.4–10.5)
Creatinine, Ser: 0.67 mg/dL (ref 0.4–1.5)
Glucose, Bld: 124 mg/dL — ABNORMAL HIGH (ref 70–99)

## 2011-05-12 LAB — GLUCOSE, CAPILLARY
Glucose-Capillary: 199 mg/dL — ABNORMAL HIGH (ref 70–99)
Glucose-Capillary: 182 mg/dL — ABNORMAL HIGH (ref 70–99)

## 2011-05-12 NOTE — Discharge Summary (Signed)
[name], [name] NO.:  [id number]  MEDICAL RECORD NO.:  [id number]  LOCATION:  2116                         FACILITY:  [facility]  PHYSICIAN:  [name] Canal, MD      DATE OF BIRTH:  [birth year]  DATE OF ADMISSION:  05/09/2011 DATE OF DISCHARGE:                        DISCHARGE SUMMARY - REFERRING   PROBLEM LIST: 1. Diabetic ketoacidosis, possibly related to noncompliance. 2. Leukocytosis, reactionary versus possible infection. 3. Possible depression with some psychosis.  PROCEDURES PERFORMED: 1. CT of the brain without contrast on June 10 was negative. 2. Chest x-ray on June 10; no active disease, some mild elevation of     the right hemidiaphragm.  HOSPITAL COURSE:  [name] [name] was admitted on June 9 with altered mental status.  Apparently, he was less responsive to family members.  He was also agitated, quite confused, and lethargic.  At admission, the patient was found to be in DKA with a glucose of 775 mg/dL, bicarb was 11 with an anion gap of 24.  PH was 7.21.  The patient was hence appropriately treated for DKA with IV insulin fluids.  Because of leukocytosis and confusion, the patient was started on empiric antibiotics as there was concern for possible indolent infection.  He has continued to improve and diabetic wise, sugars are back to baseline, he is out of DKA. However, he has had behavioral issues with agitation and seems depressed, and he is not engaging in any meaningful conversation according to his wife.  He seems quite angry and in the last few months he has had multiple admissions because he is not taking his medications once discharged.  It is not clear whether he is just failing to procure the medications or there may be some element of depression.  In this admission, the patient was quite aggressive to staff members.  He seems to be have calmed down some.  I asked Psychiatry to see to look into the possibility of depression.  Otherwise  today, he is not saying much.  PHYSICAL EXAMINATION:  GENERAL:  He is not in acute distress. VITAL SIGNS:  Stable with blood pressure of 129/73, heart rate 85, temperature 97.6, respirations 12, oxygen saturations 94% on room air, fingersticks ranged from 112-199 mg/dL. HEAD, EYES, EARS, NOSE, AND THROAT:  Pupils are equal and reacting to light.  No jugular venous distention.  No carotid bruits. RESPIRATORY:  Bilateral air entry with no rhonchi, rales, or wheezes. CARDIOVASCULAR:  First and second heart sounds are heard.  No murmurs. Pulse regular. ABDOMEN:  Benign. CNS:  The patient is apathetic, but moving all extremities, barely responds to questions. EXTREMITIES:  No pedal edema.  Peripheral pulses are equal.  LABORATORY DATA:  Labs were reviewed, significant for sodium 143, potassium 3, BUN 12, creatinine 0.67, glucose 124, bicarb 25.  PLAN: 1. Type 1 diabetes mellitus with acute DKA, DKA since resolved.  This     is secondary to noncompliance.  Hemoglobin A1c was 10.4 on May 17.     He may be better off for discharge on 70/30 which is cheaper and     may be easier to use.  At this point, I have had  this discussion     with him as he is not engaging in any meaningful conversation.     Case utilization has been working with the wife to try to ensure     that the patient has access to some insulin at discharge.  This is     obviously a big challenge and likely the patient will keep getting     recurrent admissions for the same. 2. Possible depression and some psychosis.  The patient seems to be     settling down in terms of psychosis.  We will ask Psychiatry to     see. 3. DVT and GI prophylaxis. 4. Leukocytosis.  This has resolved.  It could have been reactionary.     He is on day #3 of Levaquin and would complete 7 days as infection     could also have contributed to the DKA, especially in setting of     immunocompromise related to the diabetes.  DISPOSITION:  The patient  is stable for transfer to MedSurg floor.  We will ask physical therapy to ambulate the patient and also have fall precautions in place.     [name] Canal, MD     SR/MEDQ  D:  05/12/2011  T:  05/12/2011  Job:  [id number]  Electronically Signed by [name] [name]  on 05/12/2011 07:40:00 PM

## 2011-05-13 LAB — BASIC METABOLIC PANEL
BUN: 13 mg/dL (ref 6–23)
CO2: 25 mEq/L (ref 19–32)
Calcium: 8.6 mg/dL (ref 8.4–10.5)
GFR calc non Af Amer: >60 mL/min (ref >60)
Glucose, Bld: 149 mg/dL — ABNORMAL HIGH (ref 70–99)

## 2011-05-13 LAB — GLUCOSE, CAPILLARY
Glucose-Capillary: 154 mg/dL — ABNORMAL HIGH (ref 70–99)
Glucose-Capillary: 215 mg/dL — ABNORMAL HIGH (ref 70–99)
Glucose-Capillary: 238 mg/dL — ABNORMAL HIGH (ref 70–99)

## 2011-05-13 LAB — CBC
HCT: 37.5 % — ABNORMAL LOW (ref 39.0–52.0)
MCH: 29.6 pg (ref 26.0–34.0)
MCHC: 34.1 g/dL (ref 30.0–36.0)
MCV: 86.8 fL (ref 78.0–100.0)
Platelets: 243 10*3/uL (ref 150–400)
RDW: 12.9 % (ref 11.5–15.5)

## 2011-05-14 LAB — GLUCOSE, CAPILLARY
Glucose-Capillary: 229 mg/dL — ABNORMAL HIGH (ref 70–99)
Glucose-Capillary: 316 mg/dL — ABNORMAL HIGH (ref 70–99)
Glucose-Capillary: 178 mg/dL — ABNORMAL HIGH (ref 70–99)

## 2011-05-15 ENCOUNTER — Inpatient Hospital Stay (HOSPITAL_COMMUNITY)
Admission: AD | Admit: 2011-05-15 | Discharge: 2011-05-17 | DRG: 885 | Disposition: A | Discharge status: Home / Self Care | Payer: PRIVATE HEALTH INSURANCE | Source: Ambulatory Visit | Attending: Psychiatry | Admitting: Psychiatry

## 2011-05-15 DIAGNOSIS — F330 Major depressive disorder, recurrent, mild: Principal | ICD-10-CM

## 2011-05-15 DIAGNOSIS — Z794 Long term (current) use of insulin: Secondary | ICD-10-CM

## 2011-05-15 DIAGNOSIS — E119 Type 2 diabetes mellitus without complications: Secondary | ICD-10-CM

## 2011-05-15 DIAGNOSIS — Z560 Unemployment, unspecified: Secondary | ICD-10-CM

## 2011-05-15 DIAGNOSIS — F39 Unspecified mood [affective] disorder: Secondary | ICD-10-CM

## 2011-05-15 LAB — COMPREHENSIVE METABOLIC PANEL
ALT: 9 U/L (ref 0–53)
AST: 14 U/L (ref 0–37)
Alkaline Phosphatase: 96 U/L (ref 39–117)
CO2: 26 mEq/L (ref 19–32)
Chloride: 104 mEq/L (ref 96–112)
GFR calc Af Amer: >60 mL/min (ref >60)
GFR calc non Af Amer: >60 mL/min (ref >60)
Glucose, Bld: 278 mg/dL — ABNORMAL HIGH (ref 70–99)
Potassium: 3.4 mEq/L — ABNORMAL LOW (ref 3.5–5.1)
Sodium: 137 mEq/L (ref 135–145)
Total Bilirubin: 0.3 mg/dL (ref 0.3–1.2)

## 2011-05-15 LAB — GLUCOSE, CAPILLARY: Glucose-Capillary: 249 mg/dL — ABNORMAL HIGH (ref 70–99)

## 2011-05-16 DIAGNOSIS — F339 Major depressive disorder, recurrent, unspecified: Secondary | ICD-10-CM

## 2011-05-16 LAB — GLUCOSE, CAPILLARY
Glucose-Capillary: 333 mg/dL — ABNORMAL HIGH (ref 70–99)
Glucose-Capillary: 287 mg/dL — ABNORMAL HIGH (ref 70–99)
Glucose-Capillary: 189 mg/dL — ABNORMAL HIGH (ref 70–99)

## 2011-05-17 LAB — GLUCOSE, CAPILLARY: Glucose-Capillary: 444 mg/dL — ABNORMAL HIGH (ref 70–99)

## 2011-05-20 NOTE — Discharge Summary (Signed)
[name], CREPS NO.:  [id number]  MEDICAL RECORD NO.:  [id number]  LOCATION:                                FACILITY:  [facility]  PHYSICIAN:  Franchot Gallo, MD     DATE OF BIRTH:  May 14, 1956  DATE OF ADMISSION:  05/15/2011 DATE OF DISCHARGE:  05/17/2011                              DISCHARGE SUMMARY   [name] is a 55 year old married black male who was admitted to [facility] as a transfer from the [facility] ED.  The patient states that he was initially taken to [facility] for aggressive behavior as well as confusion.  When the patient arrived at the emergency department, he was found to have a blood sugar of 775.  It appears that the patient had presented to his outpatient provider, which is [facility], and was told that he could not receive refills for his insulin due to them not having an updated financial statement.  The patient states that he contacted his wife, who came to the appointment within 30 minutes, but they were then told that he was too late to be seen.  The patient reports that he went home without insulin and was trying to "get by" until his rescheduled appointment, which was a month later.  He states that his blood sugar began to rise. He began to experience more and more confusion to the point where his wife brought him to the emergency department after he became confused and aggressive.  The patient reports that he also was told that he was voicing suicidal thoughts but has no recollection of this.  As the patient's blood sugar issues resolved, he began to clear mentally, and by the time he arrived at [facility] on 05/17/2011 he was reporting some difficulty with sleep but reported a good appetite as well as very mild depressive symptoms.  The patient denied any suicidal or homicidal ideations and reported some mild anxiety.  It was discussed with the patient the possibility of discharge, but  it was decided it would be best to wait until the following day, to where collateral information could be obtained from his wife.  On the morning of May 17, 2011, the patient's wife arrived and met with this provider as well as the PA.  She collaborated the [employer], stating that he had no history of any thoughts of harming himself or any history of aggressive behavior but became more confused as his blood sugars became more out of control.  On the day of discharge, the patient stated that he was sleeping reasonably well but had some difficulty related to fluctuation of blood sugar brought on by the patient eating chocolate prior to going to bed.  He reported a fair appetite and continued to report mild feelings of sadness, anhedonia and depressed mood.  He adamantly denied any suicidal ideations or homicidal ideations and denied any auditory or visual hallucinations or delusional thinking. The patient described his anxiety as mild.  The patient was scheduled for discharge with an appointment to be seen by his primary care provider at [facility] the following morning.  The patient will also  be seen for psychiatric followup.  Significant laboratories studies involved an admission serum blood sugar of 775.  At time of discharge, the patient's blood sugar was under better control and was running in the low 200 range, which the patient stated was his baseline.  FINAL DIAGNOSES:  Axis I:  Major depressive disorder - recurrent - mild. Axis II:  None. Axis III: 1. Insulin-dependent diabetes mellitus. 2. Status post diabetic ketoacidosis. Axis IV:  Recent job loss.  Unemployment.  Financial constraints. Axis V:  GAF at time of admission approximately 30.  GAF at time of discharge approximately 65.  At the time of discharge, the patient's confusion had resolved and the patient was functioning back to baseline.  The patient reported some mild depressive symptoms, which have been  present for quite some time. He declined an offer to start an antidepressant medication, stating that he felt his depression was situational-based and he preferred not to add a new medication to his regimen.  Again, the patient adamantly denied any suicidal or homicidal ideations and denied any auditory or visual hallucinations or delusional thinking.  He reported that his anxiety symptoms were mild, and he denied any medication-related side effects.  FOLLOWUP:  The patient will follow up with his primary care provider, which is [facility], on May 18, 2011 at 10:00 a.m.          ______________________________ Franchot Gallo, MD     RR/MEDQ  D:  05/19/2011  T:  05/19/2011  Job:  [id number]  Electronically Signed by Franchot Gallo MD on 05/20/2011 04:55:03 PM
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
## 2011-06-06 ENCOUNTER — Inpatient Hospital Stay (INDEPENDENT_AMBULATORY_CARE_PROVIDER_SITE_OTHER)
Admission: RE | Admit: 2011-06-06 | Discharge: 2011-06-06 | Disposition: A | Discharge status: Home / Self Care | Payer: PRIVATE HEALTH INSURANCE | Source: Ambulatory Visit | Attending: Emergency Medicine | Admitting: Emergency Medicine

## 2011-06-06 DIAGNOSIS — K069 Disorder of gingiva and edentulous alveolar ridge, unspecified: Secondary | ICD-10-CM

## 2011-06-06 DIAGNOSIS — K056 Periodontal disease, unspecified: Secondary | ICD-10-CM

## 2011-06-06 DIAGNOSIS — K089 Disorder of teeth and supporting structures, unspecified: Secondary | ICD-10-CM

## 2011-06-06 DIAGNOSIS — K029 Dental caries, unspecified: Secondary | ICD-10-CM

## 2011-06-06 LAB — POCT URINALYSIS DIP (DEVICE)
Hgb urine dipstick: NEGATIVE
Ketones, ur: NEGATIVE mg/dL
Protein, ur: NEGATIVE mg/dL
Specific Gravity, Urine: 1.025 (ref 1.005–1.030)
Urobilinogen, UA: 0.2 mg/dL (ref 0.0–1.0)

## 2011-06-06 LAB — GLUCOSE, CAPILLARY: Glucose-Capillary: 278 mg/dL — ABNORMAL HIGH (ref 70–99)

## 2011-06-11 NOTE — Discharge Summary (Signed)
  [name] [name], KANA NO.:  [id number]  MEDICAL RECORD NO.:  [id number]  LOCATION:  3030                         FACILITY:  [facility]  PHYSICIAN:  Jonny Ruiz, MD    DATE OF BIRTH:  05/22/1956  DATE OF ADMISSION:  05/09/2011 DATE OF DISCHARGE:  05/15/2011                              DISCHARGE SUMMARY   REASON FOR HOSPITALIZATION:  DKA.  DISCHARGE DIAGNOSES: 1. Primary diabetic ketoacidosis. 2. Depression with psychosis.  SECONDARY DIAGNOSIS:  Diabetes mellitus type 2.  DISCHARGE MEDICATIONS: 1. NovoLog insulin 2 to 5 units a.c. 2. Metformin 500 mg p.o. b.i.d. 3. NovoLog 1 to 15 units a.c. at bedtime. 4. Aspirin 81 mg p.o. daily.  CONDITION UPON DISCHARGE:  Improved.  CONSULTATION:  Psychiatry from behavioral health hospital and intensive care unit team.  HOSPITAL COURSE:  [name] is a 55 year old man with a history of diabetes mellitus type 2, medical noncompliance, who was brought to the hospital for changes in mental status/delirium and was found in DKA. Subsequently, the patient was admitted to intensive care unit where he was managed probably for DKA.  The patient responded well to IV fluids and insulin and eventually was transferred back to the floor. Incidentally, he developed psychosis with violent behavior and aggression to the staff nurses.  Psychiatry was consulted and the patient was advised to be transferred to behavioral health hospital, who upon medically cleared.  The patient had no complications during his hospital stay rather than his potassium being low at 3.4 and this was replaced with IV n.p.o.  He underwent a CT scan of the brain on May 10, 2011, which was negative and a chest x-ray on May 10, 2011, as well with no active disease.  He initially had an elevated white count for which he was given empiric antibiotics and once he was out of DKA, his white count normalized and his antibiotics were discontinued.  The patient  received appropriate DVT and GI prophylaxis and at this point, he is medically cleared to be transferred to the psych hospital.         ______________________________ Jonny Ruiz, MD    GL/MEDQ  D:  05/15/2011  T:  05/15/2011  Job:  [id number]  Electronically Signed by Jonny Ruiz MD on 06/11/2011 04:42:44 PM

## 2011-06-22 NOTE — H&P (Signed)
  NAMEGLENFORD, [name] [name] NO.:  [id number]  MEDICAL RECORD NO.:  [id number]  LOCATION:  2116                         FACILITY:  [facility]  PHYSICIAN:  Della Goo, M.D. DATE OF BIRTH:  1956/02/01  DATE OF ADMISSION:  05/09/2011 DATE OF DISCHARGE:                             HISTORY & PHYSICAL   ADDENDUM  A 55 year old male who was admitted with severe diabetic ketoacidosis. Results of the arterial blood gas returned with similar results at a pH of 7.208, a PCO2 of 25.1, a PO2 of 88.0, a bicarb of 11, and an O2 saturation of 95%.  The previously reported blood gas was reported as being venous, however, the blood gas that had been performed previously was also venous and is very similar except for the decrease in the PO2 level.  At the beginning of the administration of the IV insulin drip after IV fluids that has also been initiated, the patient again became agitated, restless, and was attempted to pull his IVs.  Due to the patient's metabolic encephalopathy and severe DKA, involuntary commitments on the patient were taken out to keep this patient hospitalized for medical treatment until his sensorium is intact, and the patient is no longer in danger medically.  The phone numbers for his next of kin were called and they were unavailable to be reached.     Della Goo, M.D.     HJ/MEDQ  D:  05/09/2011  T:  05/09/2011  Job:  [id number]  Electronically Signed by Della Goo M.D. on 06/22/2011 11:02:58 AM

## 2011-06-23 ENCOUNTER — Inpatient Hospital Stay (INDEPENDENT_AMBULATORY_CARE_PROVIDER_SITE_OTHER)
Admission: RE | Admit: 2011-06-23 | Discharge: 2011-06-23 | Disposition: A | Discharge status: Home / Self Care | Payer: Self-pay | Source: Ambulatory Visit | Attending: Emergency Medicine | Admitting: Emergency Medicine

## 2011-06-23 DIAGNOSIS — K029 Dental caries, unspecified: Secondary | ICD-10-CM

## 2011-07-06 ENCOUNTER — Inpatient Hospital Stay (INDEPENDENT_AMBULATORY_CARE_PROVIDER_SITE_OTHER)
Admission: RE | Admit: 2011-07-06 | Discharge: 2011-07-06 | Disposition: A | Discharge status: Home / Self Care | Payer: PRIVATE HEALTH INSURANCE | Source: Ambulatory Visit | Attending: Emergency Medicine | Admitting: Emergency Medicine

## 2011-07-06 DIAGNOSIS — K029 Dental caries, unspecified: Secondary | ICD-10-CM

## 2011-07-21 ENCOUNTER — Inpatient Hospital Stay (INDEPENDENT_AMBULATORY_CARE_PROVIDER_SITE_OTHER)
Admission: RE | Admit: 2011-07-21 | Discharge: 2011-07-21 | Disposition: A | Discharge status: Home / Self Care | Payer: PRIVATE HEALTH INSURANCE | Source: Ambulatory Visit | Attending: Family Medicine | Admitting: Family Medicine

## 2011-07-21 DIAGNOSIS — K089 Disorder of teeth and supporting structures, unspecified: Secondary | ICD-10-CM

## 2011-07-22 ENCOUNTER — Emergency Department (HOSPITAL_COMMUNITY)
Admission: EM | Admit: 2011-07-22 | Discharge: 2011-07-22 | Disposition: A | Discharge status: Home / Self Care | Payer: Self-pay | Attending: Emergency Medicine | Admitting: Emergency Medicine

## 2011-07-22 ENCOUNTER — Inpatient Hospital Stay (INDEPENDENT_AMBULATORY_CARE_PROVIDER_SITE_OTHER)
Admission: RE | Admit: 2011-07-22 | Discharge: 2011-07-22 | Disposition: A | Discharge status: Home / Self Care | Payer: Self-pay | Source: Ambulatory Visit | Attending: Family Medicine | Admitting: Family Medicine

## 2011-07-22 DIAGNOSIS — Z794 Long term (current) use of insulin: Secondary | ICD-10-CM | POA: Insufficient documentation

## 2011-07-22 DIAGNOSIS — Z7982 Long term (current) use of aspirin: Secondary | ICD-10-CM | POA: Insufficient documentation

## 2011-07-22 DIAGNOSIS — E860 Dehydration: Secondary | ICD-10-CM

## 2011-07-22 DIAGNOSIS — K1370 Unspecified lesions of oral mucosa: Secondary | ICD-10-CM | POA: Insufficient documentation

## 2011-07-22 DIAGNOSIS — K029 Dental caries, unspecified: Secondary | ICD-10-CM | POA: Insufficient documentation

## 2011-07-22 DIAGNOSIS — E119 Type 2 diabetes mellitus without complications: Secondary | ICD-10-CM | POA: Insufficient documentation

## 2011-07-22 DIAGNOSIS — K0381 Cracked tooth: Secondary | ICD-10-CM | POA: Insufficient documentation

## 2011-07-22 DIAGNOSIS — K0401 Reversible pulpitis: Secondary | ICD-10-CM

## 2011-07-22 DIAGNOSIS — R7989 Other specified abnormal findings of blood chemistry: Secondary | ICD-10-CM

## 2011-07-22 DIAGNOSIS — R000 Tachycardia, unspecified: Secondary | ICD-10-CM | POA: Insufficient documentation

## 2011-07-22 LAB — POCT URINALYSIS DIP (DEVICE)
Glucose, UA: 500 mg/dL — AB
Nitrite: NEGATIVE
Specific Gravity, Urine: 1.025 (ref 1.005–1.030)
Urobilinogen, UA: 0.2 mg/dL (ref 0.0–1.0)

## 2011-07-22 LAB — GLUCOSE, CAPILLARY: Glucose-Capillary: 334 mg/dL — ABNORMAL HIGH (ref 70–99)

## 2011-11-16 ENCOUNTER — Emergency Department (HOSPITAL_COMMUNITY): Payer: Medicaid Other

## 2011-11-16 ENCOUNTER — Encounter: Payer: Self-pay | Admitting: Emergency Medicine

## 2011-11-16 ENCOUNTER — Observation Stay (HOSPITAL_COMMUNITY)
Admission: EM | Admit: 2011-11-16 | Discharge: 2011-11-16 | Discharge status: Against Medical Advice | Payer: Medicaid Other | Attending: Emergency Medicine | Admitting: Emergency Medicine

## 2011-11-16 ENCOUNTER — Other Ambulatory Visit: Payer: Self-pay

## 2011-11-16 DIAGNOSIS — E7800 Pure hypercholesterolemia, unspecified: Secondary | ICD-10-CM | POA: Insufficient documentation

## 2011-11-16 DIAGNOSIS — E119 Type 2 diabetes mellitus without complications: Principal | ICD-10-CM | POA: Insufficient documentation

## 2011-11-16 DIAGNOSIS — R739 Hyperglycemia, unspecified: Secondary | ICD-10-CM

## 2011-11-16 HISTORY — DX: Pure hypercholesterolemia, unspecified: E78.00

## 2011-11-16 LAB — COMPREHENSIVE METABOLIC PANEL
AST: 14 U/L (ref 0–37)
Albumin: 3.9 g/dL (ref 3.5–5.2)
Alkaline Phosphatase: 134 U/L — ABNORMAL HIGH (ref 39–117)
BUN: 16 mg/dL (ref 6–23)
CO2: 23 mEq/L (ref 19–32)
Chloride: 88 mEq/L — ABNORMAL LOW (ref 96–112)
GFR calc non Af Amer: >90 mL/min (ref >90)
Potassium: 5.9 mEq/L — ABNORMAL HIGH (ref 3.5–5.1)
Total Bilirubin: 0.7 mg/dL (ref 0.3–1.2)

## 2011-11-16 LAB — CBC
HCT: 47.9 % (ref 39.0–52.0)
Hemoglobin: 16.2 g/dL (ref 13.0–17.0)
MCHC: 33.8 g/dL (ref 30.0–36.0)
RBC: 5.3 MIL/uL (ref 4.22–5.81)
WBC: 5.3 10*3/uL (ref 4.0–10.5)

## 2011-11-16 LAB — URINALYSIS, ROUTINE W REFLEX MICROSCOPIC
Glucose, UA: >1000 mg/dL — AB
Hgb urine dipstick: NEGATIVE
Ketones, ur: NEGATIVE mg/dL
Protein, ur: NEGATIVE mg/dL

## 2011-11-16 LAB — GLUCOSE, CAPILLARY: Glucose-Capillary: >600 mg/dL (ref 70–99)

## 2011-11-16 LAB — DIFFERENTIAL
Basophils Relative: 1 % (ref 0–1)
Lymphocytes Relative: 27 % (ref 12–46)
Monocytes Relative: 6 % (ref 3–12)
Neutro Abs: 3.5 10*3/uL (ref 1.7–7.7)
Neutrophils Relative %: 65 % (ref 43–77)

## 2011-11-16 MED ORDER — SODIUM CHLORIDE 0.9 % IV SOLN
Freq: Once | INTRAVENOUS | Status: DC
Start: 2011-11-16 — End: 2011-11-16

## 2011-11-16 MED ORDER — INSULIN ASPART 100 UNIT/ML ~~LOC~~ SOLN
10.0000 [IU] | Freq: Once | SUBCUTANEOUS | Status: AC
  Administered 2011-11-16: 10 [IU] via INTRAVENOUS
  Filled 2011-11-16: qty 1

## 2011-11-16 MED ORDER — SODIUM CHLORIDE 0.9 % IV BOLUS (SEPSIS)
1000.0000 mL | Freq: Once | INTRAVENOUS | Status: AC
  Administered 2011-11-16: 1000 mL via INTRAVENOUS

## 2011-11-16 MED ORDER — ZOLPIDEM TARTRATE 5 MG PO TABS: 5.0000 mg | ORAL_TABLET | Freq: Every evening | ORAL | Status: DC | PRN

## 2011-11-16 MED ORDER — DEXTROSE 50 % IV SOLN: 25.0000 mL | INTRAVENOUS | Status: DC | PRN

## 2011-11-16 MED ORDER — ACETAMINOPHEN 325 MG PO TABS: 650.0000 mg | ORAL_TABLET | Freq: Four times a day (QID) | ORAL | Status: DC | PRN

## 2011-11-16 MED ORDER — INSULIN REGULAR HUMAN 100 UNIT/ML IJ SOLN
INTRAMUSCULAR | Status: DC
  Administered 2011-11-16: 4.7 [IU]/h via INTRAVENOUS
  Filled 2011-11-16 (×2): qty 1

## 2011-11-16 MED ORDER — DEXTROSE-NACL 5-0.45 % IV SOLN: INTRAVENOUS | Status: DC

## 2011-11-16 MED ORDER — SODIUM CHLORIDE 0.9 % IV SOLN
Freq: Once | INTRAVENOUS | Status: AC
  Administered 2011-11-16: 14:00:00 via INTRAVENOUS

## 2011-11-16 MED ORDER — ONDANSETRON HCL 4 MG/2ML IJ SOLN: 4.0000 mg | Freq: Four times a day (QID) | INTRAMUSCULAR | Status: DC | PRN

## 2011-11-16 MED ORDER — SODIUM CHLORIDE 0.9 % IV SOLN
INTRAVENOUS | Status: DC
  Administered 2011-11-16: 17:00:00 via INTRAVENOUS

## 2011-11-16 NOTE — ED Notes (Signed)
Notified dr. Preston Fleeting of glucose 1001 and notified [facility]

## 2011-11-16 NOTE — ED Notes (Signed)
Informed patient and/or family of status.  

## 2011-11-16 NOTE — ED Notes (Signed)
Patient not found in room. Gown and leads found on bed. IV in sink. [facility]level notified

## 2011-11-16 NOTE — ED Notes (Signed)
Patient transported to X-ray 

## 2011-11-16 NOTE — ED Provider Notes (Signed)
Patient in CDU under hyperglycemia protocol.  Patient resting comfortably at present with family at bedside.  Lungs CTA bilaterally.  S1/S2, rrr, no murmur.  Abdomen soft, normal bowel sounds.  Patient is currently on glucose stabilizer, gradual reduction of blood glucose in progress.  Patient has an order request from his PCP for imaging of left elbow and right shoulder for evaluation of chronic pain-will obtain while in ED--patient has a scheduled follow-up appointment with his PCP tomorrow.  Jimmye Norman, NP 11/16/11 [phone]

## 2011-11-16 NOTE — ED Provider Notes (Signed)
History     CSN: [id number] Arrival date & time: 11/16/2011 12:58 PM   First MD Initiated Contact with Patient 11/16/11 1314      Chief Complaint  Patient presents with  . Hyperglycemia    (Consider location/radiation/quality/duration/timing/severity/associated sxs/prior treatment) HPI 55 year old male who comes into the emergency department because his blood sugars at home have been too high to be read by his meter for the last 2 days. He had seen his primary care doctor 2 days ago and had his dose of insulin increased because his A1c was over 14. He did not seek medical attention for today because he was hoping that the medication change would bring the sugar down. He also relates that he is just not felt well for the last 2 days. He has had low-grade fevers as high as 100.0. He has not had any chills or sweats. There's been some vague abdominal discomfort has been nausea and vomiting and diarrhea. He has not had any chest pain or cough. He denies arthralgias or myalgias. He has had sick contacts in the has not had his flu shot. Symptoms are described as severe. Nothing has been making it better or worse. Past Medical History  Diagnosis Date  . Diabetes mellitus   . Hypercholesterolemia     History reviewed. No pertinent past surgical history.  History reviewed. No pertinent family history.  History  Substance Use Topics  . Smoking status: Never Smoker   . Smokeless tobacco: Not on file  . Alcohol Use: No      Review of Systems  Allergies  Review of patient's allergies indicates no known allergies.  Home Medications  No current outpatient prescriptions on file.  BP 145/83  Pulse 98  Temp(Src) 99 F (37.2 C) (Oral)  Resp 19  SpO2 98%  Physical Exam 55 year old male who is resting comfortably and in no acute distress. There is mild tachypnea with a respiratory rate of 23 and mild tachycardia with heart rate of 107. Oxygen saturation is a satisfactory 97%. Head is  normocephalic and atraumatic. PERRLA, EOMI. Oropharynx is clear. Neck is supple without adenopathy or JVD. Lungs are clear without rales, wheezes, rhonchi. Back is nontender there's no CVA tenderness. Heart has regular rate and rhythm without murmur. There is no chest wall tenderness. Abdomen is soft, flat, nontender without masses or hepatosplenomegaly. Extremities have no cyanosis or edema, full range of motion present. Skin is warm and dry without rash. Neurologic: He is somewhat lethargic but the remainder mental status exam is normal. Cranial nerves are intact, there no focal motor or sensory deficits. ED Course  Procedures (including critical care time)  Labs Reviewed  GLUCOSE, CAPILLARY - Abnormal; Notable for the following:    Glucose-Capillary >600 (*)    All other components within normal limits  CBC  DIFFERENTIAL  COMPREHENSIVE METABOLIC PANEL  URINALYSIS, ROUTINE W REFLEX MICROSCOPIC   No results found.   No diagnosis found.   Date: 11/16/2011  Rate: 107  Rhythm: normal sinus rhythm  QRS Axis: normal  Intervals: normal  ST/T Wave abnormalities: normal  Conduction Disutrbances:none  Narrative Interpretation: sinus tachycardia, otherwise normal ECG. When compared with ECG of 05/11/2011, no significant changes are noted.  Old EKG Reviewed: unchanged  Results for orders placed during the hospital encounter of 11/16/11  GLUCOSE, CAPILLARY      Component Value Range   Glucose-Capillary >600 (*) 70 - 99 (mg/dL)   Comment 1 Notify RN    CBC  Component Value Range   WBC 5.3  4.0 - 10.5 (K/uL)   RBC 5.30  4.22 - 5.81 (MIL/uL)   Hemoglobin 16.2  13.0 - 17.0 (g/dL)   HCT 16.1  09.6 - 04.5 (%)   MCV 90.4  78.0 - 100.0 (fL)   MCH 30.6  26.0 - 34.0 (pg)   MCHC 33.8  30.0 - 36.0 (g/dL)   RDW 40.9  81.1 - 91.4 (%)   Platelets 248  150 - 400 (K/uL)  DIFFERENTIAL      Component Value Range   Neutrophils Relative 65  43 - 77 (%)   Neutro Abs 3.5  1.7 - 7.7 (K/uL)    Lymphocytes Relative 27  12 - 46 (%)   Lymphs Abs 1.4  0.7 - 4.0 (K/uL)   Monocytes Relative 6  3 - 12 (%)   Monocytes Absolute 0.3  0.1 - 1.0 (K/uL)   Eosinophils Relative 1  0 - 5 (%)   Eosinophils Absolute 0.0  0.0 - 0.7 (K/uL)   Basophils Relative 1  0 - 1 (%)   Basophils Absolute 0.1  0.0 - 0.1 (K/uL)  COMPREHENSIVE METABOLIC PANEL      Component Value Range   Sodium 124 (*) 135 - 145 (mEq/L)   Potassium 5.9 (*) 3.5 - 5.1 (mEq/L)   Chloride 88 (*) 96 - 112 (mEq/L)   CO2 23  19 - 32 (mEq/L)   Glucose, Bld 1001 (*) 70 - 99 (mg/dL)   BUN 16  6 - 23 (mg/dL)   Creatinine, Ser 7.82  0.50 - 1.35 (mg/dL)   Calcium 9.2  8.4 - 95.6 (mg/dL)   Total Protein 7.2  6.0 - 8.3 (g/dL)   Albumin 3.9  3.5 - 5.2 (g/dL)   AST 14  0 - 37 (U/L)   ALT 11  0 - 53 (U/L)   Alkaline Phosphatase 134 (*) 39 - 117 (U/L)   Total Bilirubin 0.7  0.3 - 1.2 (mg/dL)   GFR calc non Af Amer >90  >90 (mL/min)   GFR calc Af Amer >90  >90 (mL/min)  URINALYSIS, ROUTINE W REFLEX MICROSCOPIC      Component Value Range   Color, Urine YELLOW  YELLOW    APPearance CLEAR  CLEAR    Specific Gravity, Urine 1.031 (*) 1.005 - 1.030    pH 7.0  5.0 - 8.0    Glucose, UA >1000 (*) NEGATIVE (mg/dL)   Hgb urine dipstick NEGATIVE  NEGATIVE    Bilirubin Urine NEGATIVE  NEGATIVE    Ketones, ur NEGATIVE  NEGATIVE (mg/dL)   Protein, ur NEGATIVE  NEGATIVE (mg/dL)   Urobilinogen, UA 0.2  0.0 - 1.0 (mg/dL)   Nitrite NEGATIVE  NEGATIVE    Leukocytes, UA NEGATIVE  NEGATIVE   URINE MICROSCOPIC-ADD ON      Component Value Range   Urine-Other       Value: NO FORMED ELEMENTS SEEN ON URINE MICROSCOPIC EXAMINATION  GLUCOSE, CAPILLARY      Component Value Range   Glucose-Capillary >600 (*) 70 - 99 (mg/dL)  GLUCOSE, CAPILLARY      Component Value Range   Glucose-Capillary >600 (*) 70 - 99 (mg/dL)  GLUCOSE, CAPILLARY      Component Value Range   Glucose-Capillary 547 (*) 70 - 99 (mg/dL)   Comment 1 Documented in Chart     Comment 2  Call MD NNP PA CNM    GLUCOSE, CAPILLARY      Component Value Range   Glucose-Capillary 508 (*)  70 - 99 (mg/dL)   Comment 1 Notify RN     Comment 2 Documented in Chart     Dg Chest 2 View  11/16/2011  *RADIOLOGY REPORT*  Clinical Data: Low blood sugar, hypertension.  CHEST - 2 VIEW  Comparison: 05/10/2011  Findings: Heart and mediastinal contours are within normal limits. No focal opacities or effusions.  No acute bony abnormality.  IMPRESSION: No active cardiopulmonary disease.  Original Report Authenticated By: Cyndie Chime, M.D.   Dg Shoulder Right  11/16/2011  *RADIOLOGY REPORT*  Clinical Data: Chronic pain.  No known trauma.  RIGHT SHOULDER - 2+ VIEW  Comparison: None  Findings: Early degenerative changes in the right [facility] joint with early spurring.  Glenohumeral joint is unremarkable. No acute bony abnormality.  Specifically, no fracture, subluxation, or dislocation.  Soft tissues are intact.  IMPRESSION: No acute bony abnormality.  Original Report Authenticated By: Cyndie Chime, M.D.   Dg Elbow Complete Left  11/16/2011  *RADIOLOGY REPORT*  Clinical Data: Chronic pain.  No known trauma.  LEFT ELBOW - COMPLETE 3+ VIEW  Comparison: None.  Findings: No acute bony abnormality.  Specifically, no fracture, subluxation, or dislocation.  Soft tissues are intact.  No joint effusion.  IMPRESSION: No acute bony abnormality.  Original Report Authenticated By: Cyndie Chime, M.D.   Ct Head Wo Contrast  11/18/2011  *RADIOLOGY REPORT*  Clinical Data: Altered mental status  CT HEAD WITHOUT CONTRAST  Technique:  Contiguous axial images were obtained from the base of the skull through the vertex without contrast.  Comparison: May 10, 2011  Findings: There is no midline shift, hydrocephalus, or mass effect. There is no acute hemorrhage or acute transcortical infarct.  The bony calvarium is intact.  The visualized sinuses are clear.There is minimal chronic diffuse atrophy.  IMPRESSION: No focal acute  intracranial abnormality identified.  Original Report Authenticated By: Sherian Rein, M.D.   Dg Chest Portable 1 View  11/18/2011  *RADIOLOGY REPORT*  Clinical Data: Altered mental status  PORTABLE CHEST - 1 VIEW  Comparison: 11/16/2011  Findings: Cardiomediastinal silhouette is stable.  No acute infiltrate or pleural effusion.  No pulmonary edema.  The bony thorax is stable.  IMPRESSION: . No active disease.  No significant change.  Original Report Authenticated By: Natasha Mead, M.D.   He is given IV fluids and IV insulin and is placed in the CDU under the hyperglycemia protocol.   MDM  Hyperglycemia with no clinical evidence of DKA. Electrolytes will be checked to rule out DKA, and he will be given IV fluids and IV insulin to correct his blood sugar.        Dione Booze, MD 11/21/11 [phone]

## 2011-11-16 NOTE — ED Notes (Signed)
Pt c/o hyperglycemia, N/V and altered mental status x 2 days; pt sts taking meds; pt lethargic at present

## 2011-11-16 NOTE — ED Notes (Signed)
Patients cbg exceeds 600.  (glucometer)  emt/liza

## 2011-11-16 NOTE — ED Provider Notes (Signed)
Patient apparently eloped after family departed.  Patient not in room when family returned (gown removed, ecg leads disconnected and on bed, IV disconnected and placed in sink).  Jimmye Norman, NP 11/16/11 2022

## 2011-11-16 NOTE — ED Notes (Signed)
Reports "high"CBG's especially past 2 days, fever, chills, c/o generalized body pain, n/v, worsening lethargy. Pt presently appears drowsy, slow to respond, answers questions appropriately. States did not take any insulin today. Last emesis PTA.

## 2011-11-17 ENCOUNTER — Emergency Department (HOSPITAL_COMMUNITY)
Admission: EM | Admit: 2011-11-17 | Discharge: 2011-11-17 | Discharge status: Against Medical Advice | Payer: Medicaid Other | Attending: Emergency Medicine | Admitting: Emergency Medicine

## 2011-11-17 DIAGNOSIS — R404 Transient alteration of awareness: Secondary | ICD-10-CM | POA: Insufficient documentation

## 2011-11-17 DIAGNOSIS — R7309 Other abnormal glucose: Secondary | ICD-10-CM | POA: Insufficient documentation

## 2011-11-17 LAB — GLUCOSE, CAPILLARY

## 2011-11-17 NOTE — ED Notes (Signed)
While obtaining information from ems, pt leaves ed thru ambulance bay and states he does not want to stay or be treated.  Once outside christa alridge rn comes to assess the situation, pt fully a/o x 4, pt denies suicidal/homicidal ideations.  Pt refuses vitals or further tx and states he must pick his daughter up from school.  Charge nurse made aware and since no ivc papers, altered mental status, or suicidal/homicidal thoughts are present, pt is free to leave at his will.  Pt understands condition fully and takes full responsibility for leaving.  Pt offered to call for a ride from the lobby but refuses.

## 2011-11-17 NOTE — ED Notes (Signed)
EAV:WUJW<JX> Expected date:11/17/11<BR> Expected time: 3:27 PM<BR> Means of arrival:Ambulance<BR> Comments:<BR> EMS 30 GC - hyperglycemia

## 2011-11-17 NOTE — ED Provider Notes (Signed)
See CDU documentation.  Dione Booze, MD 11/17/11 [phone]

## 2011-11-17 NOTE — ED Provider Notes (Signed)
See CDU documentation  Dione Booze, MD 11/17/11 [phone]

## 2011-11-17 NOTE — ED Notes (Signed)
Per ems, pt's symptoms began yesterday.  Assisted living facility sent pt to [facility] yesterday for hyperglycemia.  cbg enroute was 401.  ems states pt appeared to have an altered level of consciousness. Pt alert and oriented x4 at arrival.

## 2011-11-17 NOTE — ED Notes (Signed)
CBG 400 

## 2011-11-18 ENCOUNTER — Emergency Department (HOSPITAL_COMMUNITY): Payer: Medicaid Other

## 2011-11-18 ENCOUNTER — Inpatient Hospital Stay (HOSPITAL_COMMUNITY)
Admission: EM | Admit: 2011-11-18 | Discharge: 2011-11-23 | DRG: 918 | Disposition: A | Discharge status: Other Acute Inpatient Hospital | Payer: Medicaid Other | Attending: Internal Medicine | Admitting: Internal Medicine

## 2011-11-18 ENCOUNTER — Encounter (HOSPITAL_COMMUNITY): Payer: Self-pay | Admitting: Radiology

## 2011-11-18 ENCOUNTER — Other Ambulatory Visit: Payer: Self-pay

## 2011-11-18 DIAGNOSIS — Z794 Long term (current) use of insulin: Secondary | ICD-10-CM

## 2011-11-18 DIAGNOSIS — I10 Essential (primary) hypertension: Secondary | ICD-10-CM | POA: Diagnosis present

## 2011-11-18 DIAGNOSIS — T50901A Poisoning by unspecified drugs, medicaments and biological substances, accidental (unintentional), initial encounter: Secondary | ICD-10-CM

## 2011-11-18 DIAGNOSIS — T481X4A Poisoning by skeletal muscle relaxants [neuromuscular blocking agents], undetermined, initial encounter: Secondary | ICD-10-CM | POA: Diagnosis present

## 2011-11-18 DIAGNOSIS — R4182 Altered mental status, unspecified: Secondary | ICD-10-CM | POA: Diagnosis present

## 2011-11-18 DIAGNOSIS — E7800 Pure hypercholesterolemia, unspecified: Secondary | ICD-10-CM | POA: Diagnosis present

## 2011-11-18 DIAGNOSIS — T424X1A Poisoning by benzodiazepines, accidental (unintentional), initial encounter: Secondary | ICD-10-CM | POA: Diagnosis present

## 2011-11-18 DIAGNOSIS — T424X4A Poisoning by benzodiazepines, undetermined, initial encounter: Principal | ICD-10-CM | POA: Diagnosis present

## 2011-11-18 DIAGNOSIS — T43591A Poisoning by other antipsychotics and neuroleptics, accidental (unintentional), initial encounter: Secondary | ICD-10-CM | POA: Diagnosis present

## 2011-11-18 DIAGNOSIS — M25519 Pain in unspecified shoulder: Secondary | ICD-10-CM | POA: Diagnosis present

## 2011-11-18 DIAGNOSIS — E1149 Type 2 diabetes mellitus with other diabetic neurological complication: Secondary | ICD-10-CM | POA: Diagnosis present

## 2011-11-18 DIAGNOSIS — E1165 Type 2 diabetes mellitus with hyperglycemia: Secondary | ICD-10-CM | POA: Diagnosis present

## 2011-11-18 DIAGNOSIS — E1142 Type 2 diabetes mellitus with diabetic polyneuropathy: Secondary | ICD-10-CM | POA: Diagnosis present

## 2011-11-18 HISTORY — DX: Gastro-esophageal reflux disease without esophagitis: K21.9

## 2011-11-18 HISTORY — DX: Polyneuropathy, unspecified: G62.9

## 2011-11-18 HISTORY — DX: Essential (primary) hypertension: I10

## 2011-11-18 LAB — CBC
HCT: 41.4 % (ref 39.0–52.0)
HCT: 39.4 % (ref 39.0–52.0)
MCH: 30 pg (ref 26.0–34.0)
MCH: 31.2 pg (ref 26.0–34.0)
MCHC: 35 g/dL (ref 30.0–36.0)
MCV: 85.7 fL (ref 78.0–100.0)
MCV: 85.5 fL (ref 78.0–100.0)
Platelets: 208 10*3/uL (ref 150–400)
RBC: 4.61 MIL/uL (ref 4.22–5.81)
RDW: 12.3 % (ref 11.5–15.5)
RDW: 12.3 % (ref 11.5–15.5)

## 2011-11-18 LAB — URINALYSIS, ROUTINE W REFLEX MICROSCOPIC
Bilirubin Urine: NEGATIVE
Leukocytes, UA: NEGATIVE
Nitrite: NEGATIVE
Protein, ur: NEGATIVE mg/dL
Urobilinogen, UA: 0.2 mg/dL (ref 0.0–1.0)

## 2011-11-18 LAB — CARDIAC PANEL(CRET KIN+CKTOT+MB+TROPI)
Relative Index: INVALID (ref 0.0–2.5)
Total CK: 96 U/L (ref 7–232)

## 2011-11-18 LAB — DIFFERENTIAL
Basophils Absolute: 0 10*3/uL (ref 0.0–0.1)
Basophils Absolute: 0 10*3/uL (ref 0.0–0.1)
Basophils Relative: 1 % (ref 0–1)
Basophils Relative: 1 % (ref 0–1)
Eosinophils Absolute: 0.1 10*3/uL (ref 0.0–0.7)
Eosinophils Relative: 1 % (ref 0–5)
Lymphocytes Relative: 34 % (ref 12–46)
Monocytes Absolute: 0.2 10*3/uL (ref 0.1–1.0)
Monocytes Absolute: 0.6 10*3/uL (ref 0.1–1.0)
Neutro Abs: 4.2 10*3/uL (ref 1.7–7.7)
Neutrophils Relative %: 57 % (ref 43–77)

## 2011-11-18 LAB — RAPID URINE DRUG SCREEN, HOSP PERFORMED
Barbiturates: NOT DETECTED
Benzodiazepines: NOT DETECTED

## 2011-11-18 LAB — ACETAMINOPHEN LEVEL: Acetaminophen (Tylenol), Serum: <15 ug/mL (ref 10–30)

## 2011-11-18 LAB — GLUCOSE, CAPILLARY
Glucose-Capillary: 449 mg/dL — ABNORMAL HIGH (ref 70–99)
Glucose-Capillary: 362 mg/dL — ABNORMAL HIGH (ref 70–99)
Glucose-Capillary: 320 mg/dL — ABNORMAL HIGH (ref 70–99)
Glucose-Capillary: 99 mg/dL (ref 70–99)

## 2011-11-18 LAB — COMPREHENSIVE METABOLIC PANEL
AST: 18 U/L (ref 0–37)
AST: 16 U/L (ref 0–37)
Albumin: 3.5 g/dL (ref 3.5–5.2)
BUN: 8 mg/dL (ref 6–23)
CO2: 21 mEq/L (ref 19–32)
Calcium: 9.1 mg/dL (ref 8.4–10.5)
Calcium: 8.6 mg/dL (ref 8.4–10.5)
Creatinine, Ser: 0.85 mg/dL (ref 0.50–1.35)
Creatinine, Ser: 0.73 mg/dL (ref 0.50–1.35)
GFR calc non Af Amer: >90 mL/min (ref >90)
Total Bilirubin: 0.4 mg/dL (ref 0.3–1.2)

## 2011-11-18 LAB — BLOOD GAS, ARTERIAL
Bicarbonate: 24.4 mEq/L — ABNORMAL HIGH (ref 20.0–24.0)
TCO2: 21.5 mmol/L (ref 0–100)
pCO2 arterial: 41.1 mmHg (ref 35.0–45.0)
pH, Arterial: 7.391 (ref 7.350–7.450)

## 2011-11-18 LAB — LACTIC ACID, PLASMA: Lactic Acid, Venous: 3.6 mmol/L — ABNORMAL HIGH (ref 0.5–2.2)

## 2011-11-18 LAB — ETHANOL: Alcohol, Ethyl (B): <11 mg/dL (ref 0–11)

## 2011-11-18 LAB — SALICYLATE LEVEL: Salicylate Lvl: <2 mg/dL — ABNORMAL LOW (ref 2.8–20.0)

## 2011-11-18 MED ORDER — ACETAMINOPHEN 650 MG RE SUPP: 650.0000 mg | Freq: Four times a day (QID) | RECTAL | Status: DC | PRN

## 2011-11-18 MED ORDER — ONDANSETRON HCL 4 MG PO TABS
4.0000 mg | ORAL_TABLET | Freq: Four times a day (QID) | ORAL | Status: DC | PRN
  Filled 2011-11-18: qty 1

## 2011-11-18 MED ORDER — INSULIN GLARGINE 100 UNIT/ML ~~LOC~~ SOLN
15.0000 [IU] | Freq: Every day | SUBCUTANEOUS | Status: DC
  Administered 2011-11-19: 15 [IU] via SUBCUTANEOUS
  Filled 2011-11-18: qty 1

## 2011-11-18 MED ORDER — ALUM & MAG HYDROXIDE-SIMETH 200-200-20 MG/5ML PO SUSP
30.0000 mL | Freq: Four times a day (QID) | ORAL | Status: DC | PRN
  Administered 2011-11-21 – 2011-11-23 (×3): 30 mL via ORAL
  Filled 2011-11-18 (×3): qty 30

## 2011-11-18 MED ORDER — SODIUM CHLORIDE 0.9 % IV SOLN
INTRAVENOUS | Status: DC
  Administered 2011-11-19 (×3): via INTRAVENOUS

## 2011-11-18 MED ORDER — ONDANSETRON HCL 4 MG/2ML IJ SOLN: 4.0000 mg | Freq: Four times a day (QID) | INTRAMUSCULAR | Status: DC | PRN

## 2011-11-18 MED ORDER — INSULIN ASPART 100 UNIT/ML ~~LOC~~ SOLN
0.0000 [IU] | SUBCUTANEOUS | Status: DC
  Administered 2011-11-18: 5 [IU] via SUBCUTANEOUS
  Administered 2011-11-18: 15 [IU] via SUBCUTANEOUS
  Administered 2011-11-19 (×2): 3 [IU] via SUBCUTANEOUS
  Administered 2011-11-20 (×3): 2 [IU] via SUBCUTANEOUS
  Administered 2011-11-21: 5 [IU] via SUBCUTANEOUS
  Administered 2011-11-21: 4 [IU] via SUBCUTANEOUS
  Administered 2011-11-22 (×3): 11 [IU] via SUBCUTANEOUS
  Administered 2011-11-22: 3 [IU] via SUBCUTANEOUS
  Administered 2011-11-22: 5 [IU] via SUBCUTANEOUS
  Administered 2011-11-22: 8 [IU] via SUBCUTANEOUS
  Administered 2011-11-22: 2 [IU] via SUBCUTANEOUS
  Administered 2011-11-23: 5 [IU] via SUBCUTANEOUS
  Administered 2011-11-23: 11 [IU] via SUBCUTANEOUS
  Administered 2011-11-23: 5 [IU] via SUBCUTANEOUS
  Filled 2011-11-18 (×2): qty 1
  Filled 2011-11-18: qty 3
  Filled 2011-11-18: qty 1

## 2011-11-18 MED ORDER — ACETAMINOPHEN 325 MG PO TABS
650.0000 mg | ORAL_TABLET | Freq: Four times a day (QID) | ORAL | Status: DC | PRN
  Administered 2011-11-21: 325 mg via ORAL
  Filled 2011-11-18: qty 2
  Filled 2011-11-18: qty 1

## 2011-11-18 MED ORDER — SODIUM CHLORIDE 0.9 % IV BOLUS (SEPSIS): 1000.0000 mL | Freq: Once | INTRAVENOUS | Status: DC

## 2011-11-18 MED ORDER — DOCUSATE SODIUM 100 MG PO CAPS
100.0000 mg | ORAL_CAPSULE | Freq: Two times a day (BID) | ORAL | Status: DC
  Administered 2011-11-20: 100 mg via ORAL
  Filled 2011-11-18 (×11): qty 1

## 2011-11-18 MED ORDER — ENOXAPARIN SODIUM 40 MG/0.4ML ~~LOC~~ SOLN
40.0000 mg | SUBCUTANEOUS | Status: DC
  Administered 2011-11-19 – 2011-11-22 (×3): 40 mg via SUBCUTANEOUS
  Filled 2011-11-18 (×8): qty 0.4

## 2011-11-18 NOTE — ED Notes (Signed)
ZOX:[id number]<AV> Expected date:11/18/11<BR> Expected time:12:56 PM<BR> Means of arrival:Ambulance<BR> Comments:<BR> Overdose, klonopin

## 2011-11-18 NOTE — ED Provider Notes (Signed)
History     CSN: [id number] Arrival date & time: 11/18/2011  1:16 PM   First MD Initiated Contact with Patient 11/18/11 1323      Chief Complaint  Patient presents with  . Drug Overdose    (Consider location/radiation/quality/duration/timing/severity/associated sxs/prior treatment) HPI Comments: Patient as I EMS with altered mental status and reported overdose on Klonopin and Flexeril.  He was seen in this ED yesterday for hyperglycemia. He is a known diabetic and sugars in EMS arrival were over 300.  Apparently his significant other called EMS stating the patient took Flexeril and Klonopin. He should have 15 2 mg klonopin pills last but there bottles empty.  Patient was initially obtunded arousable to verbal stimuli localizing pain.  The history is provided by the patient.    Past Medical History  Diagnosis Date  . Diabetes mellitus   . Hypercholesterolemia     No past surgical history on file.  No family history on file.  History  Substance Use Topics  . Smoking status: Never Smoker   . Smokeless tobacco: Not on file  . Alcohol Use: No      Review of Systems  Unable to perform ROS: Mental status change    Allergies  Review of patient's allergies indicates no known allergies.  Home Medications   Current Outpatient Rx  Name Route Sig Dispense Refill  . CYCLOBENZAPRINE HCL 10 MG PO TABS Oral Take 10 mg by mouth at bedtime.      Marland Kitchen CALCIUM CARBONATE ANTACID 500 MG PO CHEW Oral Chew 1 tablet by mouth 3 (three) times daily as needed. As needed for indigestion.     Marland Kitchen CLONAZEPAM 2 MG PO TABS Oral Take 2 mg by mouth at bedtime as needed. As needed for sleep.    Marland Kitchen HYDROCODONE-ACETAMINOPHEN 5-500 MG PO CAPS Oral Take 1 capsule by mouth every 6 (six) hours as needed. As needed for pain.     . INSULIN ASPART 100 UNIT/ML [place] SOLN Subcutaneous Inject 2-20 Units into the skin 3 (three) times daily before meals. Sliding Scale Insulin.     . INSULIN GLARGINE 100 UNIT/ML [place] SOLN  Subcutaneous Inject 44 Units into the skin at bedtime.        BP 115/71  Pulse 109  Temp(Src) 98.6 F (37 C) (Oral)  Resp 16  SpO2 95%  Physical Exam  Constitutional: No distress.       Obtunded, opens eyes to pain, mumble some words  HENT:  Head: Normocephalic and atraumatic.  Mouth/Throat: Oropharynx is clear and moist. No oropharyngeal exudate.  Eyes: Conjunctivae and EOM are normal. Pupils are equal, round, and reactive to light.       Pupils 3 mm reactive  Neck: Normal range of motion. Neck supple.       No meningismus  Cardiovascular: Normal rate, regular rhythm and normal heart sounds.   Pulmonary/Chest: Effort normal and breath sounds normal. No respiratory distress.  Abdominal: Soft. There is no tenderness. There is no rebound and no guarding.  Musculoskeletal: Normal range of motion. He exhibits no edema and no tenderness.  Neurological:       Obtunded, opens eyes to verbal stimuli, follows some commands  Skin: Skin is warm.    ED Course  Procedures (including critical care time)  Labs Reviewed  COMPREHENSIVE METABOLIC PANEL - Abnormal; Notable for the following:    Glucose, Bld 443 (*)    Alkaline Phosphatase 119 (*)    All other components within normal limits  LACTIC  ACID, PLASMA - Abnormal; Notable for the following:    Lactic Acid, Venous 3.6 (*)    All other components within normal limits  BLOOD GAS, ARTERIAL - Abnormal; Notable for the following:    pO2, Arterial 71.8 (*)    Bicarbonate 24.4 (*)    All other components within normal limits  SALICYLATE LEVEL - Abnormal; Notable for the following:    Salicylate Lvl <2.0 (*)    All other components within normal limits  URINALYSIS, ROUTINE W REFLEX MICROSCOPIC - Abnormal; Notable for the following:    Specific Gravity, Urine 1.042 (*)    Glucose, UA >1000 (*)    Ketones, ur TRACE (*)    All other components within normal limits  GLUCOSE, CAPILLARY - Abnormal; Notable for the following:     Glucose-Capillary 449 (*)    All other components within normal limits  CARDIAC PANEL(CRET KIN+CKTOT+MB+TROPI)  CBC  DIFFERENTIAL  ETHANOL  ACETAMINOPHEN LEVEL  URINE MICROSCOPIC-ADD ON  URINE RAPID DRUG SCREEN (HOSP PERFORMED)   Dg Shoulder Right  11/16/2011  *RADIOLOGY REPORT*  Clinical Data: Chronic pain.  No known trauma.  RIGHT SHOULDER - 2+ VIEW  Comparison: None  Findings: Early degenerative changes in the right [facility] joint with early spurring.  Glenohumeral joint is unremarkable. No acute bony abnormality.  Specifically, no fracture, subluxation, or dislocation.  Soft tissues are intact.  IMPRESSION: No acute bony abnormality.  Original Report Authenticated By: Cyndie Chime, M.D.   Dg Elbow Complete Left  11/16/2011  *RADIOLOGY REPORT*  Clinical Data: Chronic pain.  No known trauma.  LEFT ELBOW - COMPLETE 3+ VIEW  Comparison: None.  Findings: No acute bony abnormality.  Specifically, no fracture, subluxation, or dislocation.  Soft tissues are intact.  No joint effusion.  IMPRESSION: No acute bony abnormality.  Original Report Authenticated By: Cyndie Chime, M.D.   Ct Head Wo Contrast  11/18/2011  *RADIOLOGY REPORT*  Clinical Data: Altered mental status  CT HEAD WITHOUT CONTRAST  Technique:  Contiguous axial images were obtained from the base of the skull through the vertex without contrast.  Comparison: May 10, 2011  Findings: There is no midline shift, hydrocephalus, or mass effect. There is no acute hemorrhage or acute transcortical infarct.  The bony calvarium is intact.  The visualized sinuses are clear.There is minimal chronic diffuse atrophy.  IMPRESSION: No focal acute intracranial abnormality identified.  Original Report Authenticated By: Sherian Rein, M.D.   Dg Chest Portable 1 View  11/18/2011  *RADIOLOGY REPORT*  Clinical Data: Altered mental status  PORTABLE CHEST - 1 VIEW  Comparison: 11/16/2011  Findings: Cardiomediastinal silhouette is stable.  No acute infiltrate or  pleural effusion.  No pulmonary edema.  The bony thorax is stable.  IMPRESSION: . No active disease.  No significant change.  Original Report Authenticated By: Natasha Mead, M.D.     1. Drug overdose       MDM  Altered mental status with reported overdose of Klonopin and questionably Flexeril as well.  Patient obtunded but protecting airway, vitals stable afebrile.  Blood glucose elevated.   No evidence of trauma.  Uncertain time frame of ingestion, so will not administer charcoal.  Vitals stable.  Will send tox labs, CT head, CXR, UA.  Supportive care per poison center.   Will need additional time for mental status to improve.  Patient continues to take airway. He is restless and agitated at times and pulling off his EKG leads.  He still quite somnolent and unable to  carry on a conversation. He'll need admission for monitoring of his mental status and airway before any psychiatric consult.     Date: 11/18/2011  Rate: 109  Rhythm: sinus tachycardia  QRS Axis: normal  Intervals: normal  ST/T Wave abnormalities: normal  Conduction Disutrbances:none  Narrative Interpretation:   Old EKG Reviewed: unchanged  CRITICAL CARE Performed by: Glynn Octave   Total critical care time: 40  Critical care time was exclusive of separately billable procedures and treating other patients.  Critical care was necessary to treat or prevent imminent or life-threatening deterioration.  Critical care was time spent personally by me on the following activities: development of treatment plan with patient and/or surrogate as well as nursing, discussions with consultants, evaluation of patient's response to treatment, examination of patient, obtaining history from patient or surrogate, ordering and performing treatments and interventions, ordering and review of laboratory studies, ordering and review of radiographic studies, pulse oximetry and re-evaluation of patient's condition.   Glynn Octave,  MD 11/18/11 [phone]

## 2011-11-18 NOTE — ED Notes (Signed)
Pt presenting to ed via ems with c/o possibly taking 15 klonopin. Per ems pt is following commands. Per ems pt would not talk until he arrived at ed. Pt resting in bed pt very lethargic at this time pt placed on monitor ekg and labs obtained.

## 2011-11-18 NOTE — ED Notes (Signed)
NWG:[id number]<ZH> Expected date:11/18/11<BR> Expected time: 1:23 PM<BR> Means of arrival:<BR> Comments:<BR> Res A

## 2011-11-18 NOTE — H&P (Signed)
PCP:   Norberto Sorenson, MD   Chief Complaint:  Patient with overdose of Klonopin and Flexeril  HPI: Patient is a 55 year old African American male that was brought to the emergency room secondary to overdose on Klonopin and Flexeril. Per talking to the ER physician patient's significant other called EMS stating that he took 15 of his Klonopin pills.  Patient is unable to give history because of altered mental status. He moves all extremities opened his eyes but does not answer to questions. In the emergency room patient had a head CT that was negative.  Review of Systems:  Unable to obtain secondary to altered mental status.  Past Medical History: Past Medical History  Diagnosis Date  . Diabetes mellitus   . Hypercholesterolemia   . GERD (gastroesophageal reflux disease)   . Hypertension   . Hypercholesteremia     hx of  . Hemorrhoids     hx of  . Peripheral neuropathy    Past Surgical History  Procedure Date  . Tonsillectomy     Medications: Prior to Admission medications   Medication Sig Start Date End Date Taking? Authorizing Provider  cyclobenzaprine (FLEXERIL) 10 MG tablet Take 10 mg by mouth at bedtime.     Yes Historical Provider, MD  calcium carbonate (TUMS - DOSED IN MG ELEMENTAL CALCIUM) 500 MG chewable tablet Chew 1 tablet by mouth 3 (three) times daily as needed. As needed for indigestion.     Historical Provider, MD  clonazePAM (KLONOPIN) 2 MG tablet Take 2 mg by mouth at bedtime as needed. As needed for sleep.    Historical Provider, MD  hydrocodone-acetaminophen (LORCET-HD) 5-500 MG per capsule Take 1 capsule by mouth every 6 (six) hours as needed. As needed for pain.     Historical Provider, MD  insulin aspart (NOVOLOG FLEXPEN) 100 UNIT/ML injection Inject 2-20 Units into the skin 3 (three) times daily before meals. Sliding Scale Insulin.     Historical Provider, MD  insulin glargine (LANTUS SOLOSTAR) 100 UNIT/ML injection Inject 44 Units into the skin at bedtime.       Historical Provider, MD    Allergies:  No Known Allergies  Social History:  Unable to obtain secondary to altered mental status.   Family History: Unable to obtain secondary to altered mental status.  Physical Exam: Filed Vitals:   11/18/11 1326  BP: 115/71  Pulse: 109  Temp: 98.6 F (37 C)  TempSrc: Oral  Resp: 16  SpO2: 95%   General appearance: appears stated age, no distress and uncooperative Head: Normocephalic, without obvious abnormality, atraumatic Eyes: conjunctivae/corneas clear. PERRL, EOM's intact. Fundi benign. Resp: clear to auscultation bilaterally Cardio: regular rate and rhythm, S1, S2 normal, no murmur, click, rub or gallop GI: soft, non-tender; bowel sounds normal; no masses,  no organomegaly Extremities: extremities normal, atraumatic, no cyanosis or edema Pulses: 2+ and symmetric   Labs on Admission:   [facility] 11/18/11 1328 11/16/11 1320  NA 135 124*  K 4.0 5.9*  CL 99 88*  CO2 24 23  GLUCOSE 443* 1001*  BUN 10 16  CREATININE 0.85 0.94  CALCIUM 9.1 9.2  MG -- --  PHOS -- --    Basename 11/18/11 1328 11/16/11 1320  AST 18 14  ALT 13 11  ALKPHOS 119* 134*  BILITOT 0.4 0.7  PROT 6.5 7.2  ALBUMIN 3.5 3.9   No results found for this basename: LIPASE:2,AMYLASE:2 in the last 72 hours  Basename 11/18/11 1328 11/16/11 1320  WBC 4.4 5.3  NEUTROABS 3.0  3.5  HGB 14.5 16.2  HCT 41.4 47.9  MCV 85.7 90.4  PLT 231 248    Basename 11/18/11 1328  CKTOTAL 96  CKMB 2.4  CKMBINDEX --  TROPONINI <0.30   No results found for this basename: TSH,T4TOTAL,FREET3,T3FREE,THYROIDAB in the last 72 hours No results found for this basename: VITAMINB12:2,FOLATE:2,FERRITIN:2,TIBC:2,IRON:2,RETICCTPCT:2 in the last 72 hours  Radiological Exams on Admission: Dg Chest 2 View  11/16/2011  *RADIOLOGY REPORT*  Clinical Data: Low blood sugar, hypertension.  CHEST - 2 VIEW  Comparison: 05/10/2011  Findings: Heart and mediastinal contours are within normal  limits. No focal opacities or effusions.  No acute bony abnormality.  IMPRESSION: No active cardiopulmonary disease.  Original Report Authenticated By: Cyndie Chime, M.D.   Dg Shoulder Right  11/16/2011  *RADIOLOGY REPORT*  Clinical Data: Chronic pain.  No known trauma.  RIGHT SHOULDER - 2+ VIEW  Comparison: None  Findings: Early degenerative changes in the right [facility] joint with early spurring.  Glenohumeral joint is unremarkable. No acute bony abnormality.  Specifically, no fracture, subluxation, or dislocation.  Soft tissues are intact.  IMPRESSION: No acute bony abnormality.  Original Report Authenticated By: Cyndie Chime, M.D.   Dg Elbow Complete Left  11/16/2011  *RADIOLOGY REPORT*  Clinical Data: Chronic pain.  No known trauma.  LEFT ELBOW - COMPLETE 3+ VIEW  Comparison: None.  Findings: No acute bony abnormality.  Specifically, no fracture, subluxation, or dislocation.  Soft tissues are intact.  No joint effusion.  IMPRESSION: No acute bony abnormality.  Original Report Authenticated By: Cyndie Chime, M.D.   Ct Head Wo Contrast  11/18/2011  *RADIOLOGY REPORT*  Clinical Data: Altered mental status  CT HEAD WITHOUT CONTRAST  Technique:  Contiguous axial images were obtained from the base of the skull through the vertex without contrast.  Comparison: May 10, 2011  Findings: There is no midline shift, hydrocephalus, or mass effect. There is no acute hemorrhage or acute transcortical infarct.  The bony calvarium is intact.  The visualized sinuses are clear.There is minimal chronic diffuse atrophy.  IMPRESSION: No focal acute intracranial abnormality identified.  Original Report Authenticated By: Sherian Rein, M.D.   Dg Chest Portable 1 View  11/18/2011  *RADIOLOGY REPORT*  Clinical Data: Altered mental status  PORTABLE CHEST - 1 VIEW  Comparison: 11/16/2011  Findings: Cardiomediastinal silhouette is stable.  No acute infiltrate or pleural effusion.  No pulmonary edema.  The bony thorax is  stable.  IMPRESSION: . No active disease.  No significant change.  Original Report Authenticated By: Natasha Mead, M.D.    Assessment/Plan Present on Admission:  .Altered mental status Patient's altered mental status is secondary to medication overdose. Discussed with ER physician who stated that poison control was notified and recommend close monitoring and supportive care. Patient is now more awake and moves all extremities and follow some basic commands and is restless. Will continue to monitor. Will admit to the step down unit.   .Overdose of benzodiazepine As mentioned before will monitor patient's vital signs and monitor closely in the step down unit. Consulted psychiatry.   .Diabetes mellitus Continue him on Lantus and sliding scale insulin. We'll decrease his dose of Lantus secondary to his altered mental status. When he starts eating his Lantus can be increased.  Time spent with patient calling consult and doing this admission is approximately 45 minutes. Earlene Plater MD, [place] Pier 11/18/2011, 4:49 PM

## 2011-11-18 NOTE — ED Notes (Signed)
Spoke with Jacki Cones at poison control whom instructed supportive care, ekg, cardiac monitor, tylenol level, iv fluids and possibly a repeat tylenol level in 4 hours if needed

## 2011-11-18 NOTE — ED Notes (Signed)
CBG: 449 

## 2011-11-18 NOTE — ED Notes (Signed)
CBG 224 

## 2011-11-18 NOTE — ED Notes (Signed)
CBG- 362 

## 2011-11-18 NOTE — ED Notes (Signed)
Pt attempting to get out of bed pt encouraged to remain in bed. Pt easily redirected. Charge Rn [employer] notified and aware. No sitter available per charge rn at this time

## 2011-11-18 NOTE — ED Notes (Signed)
Pt in bed. Pt obtunded. Responds  To stimuli but not communicating, Foley placed d/t pt mental status at this point. Will continue to assess. Pt door open when RN not in room to monitor.

## 2011-11-18 NOTE — ED Notes (Signed)
pts blood sugar 99

## 2011-11-19 LAB — BASIC METABOLIC PANEL
BUN: 8 mg/dL (ref 6–23)
Chloride: 108 mEq/L (ref 96–112)
GFR calc Af Amer: >90 mL/min (ref >90)
GFR calc non Af Amer: >90 mL/min (ref >90)
Potassium: 3.9 mEq/L (ref 3.5–5.1)
Sodium: 139 mEq/L (ref 135–145)

## 2011-11-19 LAB — GLUCOSE, CAPILLARY
Glucose-Capillary: 102 mg/dL — ABNORMAL HIGH (ref 70–99)
Glucose-Capillary: 163 mg/dL — ABNORMAL HIGH (ref 70–99)
Glucose-Capillary: 170 mg/dL — ABNORMAL HIGH (ref 70–99)

## 2011-11-19 LAB — CBC
HCT: 41.5 % (ref 39.0–52.0)
Hemoglobin: 14.5 g/dL (ref 13.0–17.0)
RBC: 4.8 MIL/uL (ref 4.22–5.81)
WBC: 5.5 10*3/uL (ref 4.0–10.5)

## 2011-11-19 LAB — HEMOGLOBIN A1C
Hgb A1c MFr Bld: 13 % — ABNORMAL HIGH (ref <5.7)
Mean Plasma Glucose: 326 mg/dL — ABNORMAL HIGH (ref <117)

## 2011-11-19 MED ORDER — INSULIN GLARGINE 100 UNIT/ML ~~LOC~~ SOLN: 15.0000 [IU] | Freq: Every day | SUBCUTANEOUS | Status: DC

## 2011-11-19 MED ORDER — THIAMINE HCL 100 MG/ML IJ SOLN
Freq: Once | INTRAVENOUS | Status: DC
  Filled 2011-11-19: qty 1000

## 2011-11-19 MED ORDER — INSULIN GLARGINE 100 UNIT/ML ~~LOC~~ SOLN
8.0000 [IU] | Freq: Every day | SUBCUTANEOUS | Status: DC
  Administered 2011-11-20: 8 [IU] via SUBCUTANEOUS
  Filled 2011-11-19: qty 3

## 2011-11-19 MED ORDER — SODIUM CHLORIDE 0.9 % IV SOLN
Freq: Once | INTRAVENOUS | Status: AC
  Administered 2011-11-19: 15:00:00 via INTRAVENOUS
  Filled 2011-11-19: qty 1000

## 2011-11-19 NOTE — ED Notes (Signed)
Pts wife called to check on him and gave more information as to what happened. Per his wife, pt has been depressed d/t health status with his diabetes and is really frustrated. Wife said he took possibly a bottle of benzo's  And got really aggressive.

## 2011-11-19 NOTE — ED Notes (Signed)
Unable to get all 5:00 labs. Fayrene Fearing, RN aware

## 2011-11-19 NOTE — Progress Notes (Signed)
CSW attempted to speak with pt's wife RE: current admission and collateral information for psych. Pt's wife unavailable at time of call. Message left requesting return call. CSW will attempt contact again tomorrow.  Dellie Burns, MSW, LCSWA [phone] (psych cover)

## 2011-11-19 NOTE — ED Notes (Signed)
-     PT'S WIFE.......................       [phone] (HOME)

## 2011-11-19 NOTE — ED Notes (Signed)
Patient is resting comfortably. Asleep 

## 2011-11-19 NOTE — ED Notes (Signed)
MD at bedside. 

## 2011-11-19 NOTE — ED Notes (Signed)
Vital signs stable. 

## 2011-11-19 NOTE — Progress Notes (Signed)
Subjective: Patient is still not very responsive today. He opens his eyes but does not answer questions.  Objective: Vital signs in last 24 hours: Temp:  [97.6 F (36.4 C)-97.9 F (36.6 C)] 97.9 F (36.6 C) (12/20 1749) Pulse Rate:  [85-97] 90  (12/20 1749) Resp:  [14-28] 18  (12/20 1749) BP: (97-128)/(64-94) 121/78 mmHg (12/20 1749) SpO2:  [95 %-99 %] 98 % (12/20 1749) Weight change:   -Intake/Output from previous day: 12/19 0701 - 12/20 0700 In: -  Out: 1650 [Urine:1650]  Blood pressure 121/78, pulse 90, temperature 97.9 F (36.6 C), temperature source Oral, resp. rate 18, SpO2 98.00%.  General appearance: appears stated age, no distress and uncooperative  Head: Normocephalic, without obvious abnormality, atraumatic  Eyes: conjunctivae/corneas clear. PERRL, EOM's intact. Fundi benign.  Resp: clear to auscultation bilaterally  Cardio: regular rate and rhythm, S1, S2 normal, no murmur, click, rub or gallop  GI: soft, non-tender; bowel sounds normal; no masses, no organomegaly  Extremities: extremities normal, atraumatic, no cyanosis or edema  Pulses: 2+ and symmetric  Lab Results: Lab Results  Component Value Date   WBC 5.5 11/19/2011   HGB 14.5 11/19/2011   HCT 41.5 11/19/2011   MCV 86.5 11/19/2011   PLT 199 11/19/2011    BMET  Lab 11/19/11 0720  NA 139  K 3.9  CL 108  CO2 24  BUN 8  CREATININE 0.73  LABGLOM --  GLUCOSE 243*  CALCIUM 8.7    Studies/Results: Ct Head Wo Contrast  11/18/2011  *RADIOLOGY REPORT*  Clinical Data: Altered mental status  CT HEAD WITHOUT CONTRAST  Technique:  Contiguous axial images were obtained from the base of the skull through the vertex without contrast.  Comparison: May 10, 2011  Findings: There is no midline shift, hydrocephalus, or mass effect. There is no acute hemorrhage or acute transcortical infarct.  The bony calvarium is intact.  The visualized sinuses are clear.There is minimal chronic diffuse atrophy.  IMPRESSION: No  focal acute intracranial abnormality identified.  Original Report Authenticated By: Sherian Rein, M.D.   Dg Chest Portable 1 View  11/18/2011  *RADIOLOGY REPORT*  Clinical Data: Altered mental status  PORTABLE CHEST - 1 VIEW  Comparison: 11/16/2011  Findings: Cardiomediastinal silhouette is stable.  No acute infiltrate or pleural effusion.  No pulmonary edema.  The bony thorax is stable.  IMPRESSION: . No active disease.  No significant change.  Original Report Authenticated By: Natasha Mead, M.D.    Medications:  Current Facility-Administered Medications  Medication Dose Route Frequency Provider Last Rate Last Dose  . 0.9 %  sodium chloride infusion   Intravenous Continuous Daquana Paddock Jarrett-Davis 50 mL/hr at 11/19/11 1332    . acetaminophen (TYLENOL) tablet 650 mg  650 mg Oral Q6H PRN Trustin Chapa Jarrett-Davis       Or  . acetaminophen (TYLENOL) suppository 650 mg  650 mg Rectal Q6H PRN Ritisha Deitrick Jarrett-Davis      . alum & mag hydroxide-simeth (MAALOX/MYLANTA) 200-200-20 MG/5ML suspension 30 mL  30 mL Oral Q6H PRN Naman Spychalski Jarrett-Davis      . docusate sodium (COLACE) capsule 100 mg  100 mg Oral BID Paisely Brick Jarrett-Davis      . enoxaparin (LOVENOX) injection 40 mg  40 mg Subcutaneous Q24H Jonn Chaikin Jarrett-Davis   40 mg at 11/19/11 0244  . insulin aspart (novoLOG) injection 0-15 Units  0-15 Units Subcutaneous Q4H Emanuelle Bastos Jarrett-Davis   3 Units at 11/19/11 0400  . insulin glargine (LANTUS) injection 15 Units  15 Units Subcutaneous QHS Dionel Archey Jarrett-Davis  15 Units at 11/19/11 0016  . ondansetron (ZOFRAN) tablet 4 mg  4 mg Oral Q6H PRN Trevyn Lumpkin Jarrett-Davis       Or  . ondansetron (ZOFRAN) injection 4 mg  4 mg Intravenous Q6H PRN Damia Bobrowski Jarrett-Davis      . sodium chloride 0.9 % 1,000 mL with thiamine (B-1) 100 mg, folic acid 1 mg infusion   Intravenous Once Otho Bellows, PHARMD 75 mL/hr at 11/19/11 1459    . sodium chloride 0.9 % bolus 1,000 mL  1,000 mL Intravenous Once Glynn Octave, MD      . DISCONTD:  sodium chloride 0.9 % 1,000 mL with thiamine 100 mg, folic acid 1 mg, multivitamins adult 10 mL infusion   Intravenous Once Kasen Sako Jarrett-Davis       Current Outpatient Prescriptions  Medication Sig Dispense Refill  . cyclobenzaprine (FLEXERIL) 10 MG tablet Take 10 mg by mouth at bedtime.        Marland Kitchen HYDROcodone-acetaminophen (VICODIN) 5-500 MG per tablet Take 1 tablet by mouth every 6 (six) hours as needed. For pain.       . calcium carbonate (TUMS - DOSED IN MG ELEMENTAL CALCIUM) 500 MG chewable tablet Chew 1 tablet by mouth 3 (three) times daily as needed. As needed for indigestion.       . clonazePAM (KLONOPIN) 2 MG tablet Take 2 mg by mouth at bedtime as needed. As needed for sleep.      . insulin aspart (NOVOLOG FLEXPEN) 100 UNIT/ML injection Inject 2-20 Units into the skin 3 (three) times daily before meals. Sliding Scale Insulin.       Marland Kitchen insulin glargine (LANTUS SOLOSTAR) 100 UNIT/ML injection Inject 44 Units into the skin at bedtime.          Assessment/Plan: Principal Problem: Altered mental status Patient is still with altered mental status. He opens his eyes and move all extremities but does not respond to questions. Active Problems:  Overdose of benzodiazepine Patient overdosed on his Klonopin and Flexeril. As per discussion in previous note per poison control, supportive care. Awaiting step down bed. Continue IV fluids.  Psychiatric consulted.   Diabetes mellitus Cover with Lantus and sliding scale insulin.    LOS: 1 day   Earlene Plater MD, [place] Pier 11/19/2011, 5:57 PM

## 2011-11-19 NOTE — ED Notes (Signed)
Patient is resting comfortably. 

## 2011-11-19 NOTE — ED Notes (Signed)
Patient denies pain and is resting comfortably.  

## 2011-11-19 NOTE — ED Notes (Signed)
Patient is resting comfortably. Now able to wake up and follow simple commands.

## 2011-11-19 NOTE — Progress Notes (Signed)
05/2011 Cm notes indicates pt has been having difficulty with finances since at least fall of 2011; self pay, no PCP; this is his fifth admission for DKA in 2012; he frequently leaves AMA; pt/wife were separated earlier in 2012, they are currently living in the same home; she was working in 05/2011 but with limited income; Pt had an orange card/ with [employer]  at one time but his card expired and he did not renew; he is now not eligible for services/appointments wife ph [phone]

## 2011-11-20 DIAGNOSIS — T50901A Poisoning by unspecified drugs, medicaments and biological substances, accidental (unintentional), initial encounter: Secondary | ICD-10-CM

## 2011-11-20 LAB — GLUCOSE, CAPILLARY
Glucose-Capillary: 130 mg/dL — ABNORMAL HIGH (ref 70–99)
Glucose-Capillary: 82 mg/dL (ref 70–99)
Glucose-Capillary: 124 mg/dL — ABNORMAL HIGH (ref 70–99)

## 2011-11-20 MED ORDER — HALOPERIDOL LACTATE 5 MG/ML IJ SOLN
INTRAMUSCULAR | Status: AC
  Filled 2011-11-20: qty 1

## 2011-11-20 MED ORDER — HALOPERIDOL LACTATE 5 MG/ML IJ SOLN: 2.0000 mg | Freq: Once | INTRAMUSCULAR | Status: DC

## 2011-11-20 MED ORDER — INSULIN GLARGINE 100 UNIT/ML ~~LOC~~ SOLN: 8.0000 [IU] | Freq: Every day | SUBCUTANEOUS | Status: DC

## 2011-11-20 MED ORDER — THIAMINE HCL 100 MG/ML IJ SOLN
Freq: Once | INTRAVENOUS | Status: AC
  Administered 2011-11-20: 08:00:00 via INTRAVENOUS
  Filled 2011-11-20: qty 1000

## 2011-11-20 NOTE — ED Notes (Signed)
CJ with Poison Control called to check on patients status, informed of patients current status, current VS and that labs will be drawn in am, Per CJ, they will call back and check on patient later in the day

## 2011-11-20 NOTE — Consult Note (Signed)
Patient Identification:  [name] [name] Date of Evaluation:  11/20/2011   History of Present Illness:  Patient is a 55 year old African American male that was brought to the emergency room secondary to overdose on Klonopin and Flexeril, patient's significant other called EMS stating that he took 15 of his Klonopin pills.  Patient has lost job recently and was agitated  so wife is staying away from him. Patient at this time is still having altered mental status does not want to answer  questions.  In the emergency room patient had a head CT that was negative.  PAST PSYCHIATRIC HISTORY:  Couple years ago after he developed anxiety   in his job as a Materials engineer for [employer] he developed fear of   flying.  He was started on Zoloft.  This only lasted a couple of months   and then it was stopped.  He is currently prescribed Klonopin 2 mg at   hour of sleep by his primary care provider.      SOCIAL HISTORY:  He received his bachelor's in 1988.  He has been   married once.  He has got a 37 year old son, a 71 year old daughter, a   62 year old daughter.  He has 2 grandchildren that are coming to live   with him and a nephew that is 100.      FAMILY HISTORY:  He denies.      ALCOHOL AND DRUG HISTORY:  He denies.     PHYSICIAN:  Franchot Gallo, MD     DATE OF BIRTH:  [birth year]      DATE OF ADMISSION:  05/15/2011   DATE OF DISCHARGE:  05/17/2011                                  DISCHARGE SUMMARY         [name] [name] is a 55 year old married black male who was admitted to [facility] as a transfer from the [facility] ED.  The   patient states that he was initially taken to [facility] for aggressive   behavior as well as confusion.  When the patient arrived at the   emergency department, he was found to have a blood sugar of 775.  It   appears that the patient had presented to his outpatient provider, which   is [facility], and was told that he could  not receive   refills for his insulin due to them not having an updated financial   statement.  The patient states that he contacted his wife, who came to   the appointment within 30 minutes, but they were then told that he was   too late to be seen.  The patient reports that he went home without   insulin and was trying to get by until his rescheduled appointment,   which was a month later.  He states that his blood sugar began to rise.   He began to experience more and more confusion to the point where his   wife brought him to the emergency department after he became confused   and aggressive.  The patient reports that he also was told that he was   voicing suicidal thoughts but has no recollection of this.      As the patient's blood sugar issues resolved, he began to clear   mentally, and by the time he arrived at [facility]  on 05/17/2011   he was reporting some difficulty with sleep but reported a good appetite   as well as very mild depressive symptoms.  The patient denied any   suicidal or homicidal ideations and reported some mild anxiety.  It was   discussed with the patient the possibility of discharge, but it was   decided it would be best to wait until the following day, to where   collateral information could be obtained from his wife.      On the morning of May 17, 2011, the patient's wife arrived and met with   this provider as well as the PA.  She collaborated the [employer],   stating that he had no history of any thoughts of harming himself or any   history of aggressive behavior but became more confused as his blood   sugars became more out of control.  On the day of discharge, the patient   stated that he was sleeping reasonably well but had some difficulty   related to fluctuation of blood sugar brought on by the patient eating   chocolate prior to going to bed.  He reported a fair appetite and   continued to report mild feelings of sadness, anhedonia  and depressed   mood.  He adamantly denied any suicidal ideations or homicidal ideations   and denied any auditory or visual hallucinations or delusional thinking.   The patient described his anxiety as mild.  The patient was scheduled   for discharge with an appointment to be seen by his primary care   provider at [facility] the following morning.  The patient will also be   seen for psychiatric followup.      Significant laboratories studies involved an admission serum blood sugar   of 775.  At time of discharge, the patient's blood sugar was under   better control and was running in the low 200 range, which the patient   stated was his baseline.      FINAL DIAGNOSES:  Axis I:  Major depressive disorder - recurrent - mild.   Axis II:  None.   Axis III:   1. Insulin-dependent diabetes mellitus.   2. Status post diabetic ketoacidosis.   Axis IV:  Recent job loss.  Unemployment.  Financial constraints.   Axis V:  GAF at time of admission approximately 30.  GAF at time of   discharge approximately 65.      At the time of discharge, the patient's confusion had resolved and the   patient was functioning back to baseline.  The patient reported some   mild depressive symptoms, which have been present for quite some time.   He declined an offer to start an antidepressant medication, stating that   he felt his depression was situational-based and he preferred not to add   a new medication to his regimen.  Again, the patient adamantly denied   any suicidal or homicidal ideations and denied any auditory or visual   hallucinations or delusional thinking.  He reported that his anxiety   symptoms were mild, and he denied any medication-related side effects.      FOLLOWUP:  The patient will follow up with his primary care provider,   which is [facility], on May 18, 2011 at 10:00 a.m.      Past Medical History:     Past Medical History  Diagnosis Date  . Diabetes mellitus   .  Hypercholesterolemia   . GERD (gastroesophageal reflux disease)   .  Hypertension   . Hypercholesteremia     hx of  . Hemorrhoids     hx of  . Peripheral neuropathy        Past Surgical History  Procedure Date  . Tonsillectomy     Filed Vitals:   11/20/11 1200  BP:   Pulse: 84  Temp:   Resp: 17    Lab Results:   BMET    Component Value Date/Time   NA 139 11/19/2011 0720   K 3.9 11/19/2011 0720   CL 108 11/19/2011 0720   CO2 24 11/19/2011 0720   GLUCOSE 243* 11/19/2011 0720   BUN 8 11/19/2011 0720   CREATININE 0.73 11/19/2011 0720   CALCIUM 8.7 11/19/2011 0720   GFRNONAA >90 11/19/2011 0720   GFRAA >90 11/19/2011 0720    Allergies: No Known Allergies  Current Medications:  Prior to Admission medications   Medication Sig Start Date End Date Taking? Authorizing Provider  cyclobenzaprine (FLEXERIL) 10 MG tablet Take 10 mg by mouth at bedtime.     Yes Historical Provider, MD  calcium carbonate (TUMS - DOSED IN MG ELEMENTAL CALCIUM) 500 MG chewable tablet Chew 1 tablet by mouth 3 (three) times daily as needed. As needed for indigestion.     Historical Provider, MD  clonazePAM (KLONOPIN) 2 MG tablet Take 2 mg by mouth at bedtime as needed. As needed for sleep.    Historical Provider, MD  insulin aspart (NOVOLOG FLEXPEN) 100 UNIT/ML injection Inject 2-20 Units into the skin 3 (three) times daily before meals. Sliding Scale Insulin.     Historical Provider, MD  insulin glargine (LANTUS SOLOSTAR) 100 UNIT/ML injection Inject 44 Units into the skin at bedtime.      Historical Provider, MD    Social History:    reports that he has never smoked. He does not have any smokeless tobacco history on file. He reports that he does not drink alcohol or use illicit drugs.   Family History:    History reviewed. No pertinent family history.   DIAGNOSIS:   AXIS I  Maj. depressive disorder recurrent type  AXIS II  Deffered  AXIS III See medical notes.  AXIS IV  unspecified     AXIS V 30     Recommendations:   #1 continue the current treatment   #2 once patient will be more clear psych service will followup    Eulogio Ditch, MD

## 2011-11-20 NOTE — Progress Notes (Signed)
UR CHART REVIEWED; B Curvin Hunger RN, BSN, MHA 

## 2011-11-20 NOTE — Progress Notes (Signed)
Pt refuses to answer remaining admission history questions. Says we can call his attorney for any more information but cannot provide the name/number of attorney. Requested that I call his wife. Verified her name and number with him and called her. She said she had stayed away because he was agitated/combative. York Spaniel she would come to hospital app. 1400 today.

## 2011-11-20 NOTE — Progress Notes (Signed)
Subjective: Patient is more awake and alert. He answers questions appropriately.    Objective: Vital signs in last 24 hours: Temp:  [97.6 F (36.4 C)-98.9 F (37.2 C)] 97.6 F (36.4 C) (12/21 1500) Pulse Rate:  [81-89] 84  (12/21 1500) Resp:  [13-17] 16  (12/21 1500) BP: (98-127)/(45-81) 127/80 mmHg (12/21 1500) SpO2:  [97 %-99 %] 97 % (12/21 1500) Weight:  [90.6 kg (199 lb 11.8 oz)] 199 lb 11.8 oz (90.6 kg) (12/21 0453) Weight change:   -Intake/Output from previous day: 12/20 0701 - 12/21 0700 In: 5518.3 [I.V.:5518.3] Out: 1550 [Urine:1550]  Blood pressure 127/80, pulse 84, temperature 97.6 F (36.4 C), temperature source Oral, resp. rate 16, height 6\' 1"  (1.854 m), weight 90.6 kg (199 lb 11.8 oz), SpO2 97.00%.  General appearance: appears stated age, no distress and uncooperative  Head: Normocephalic, without obvious abnormality, atraumatic  Eyes: conjunctivae/corneas clear. PERRL, EOM's intact. Fundi benign.  Resp: clear to auscultation bilaterally  Cardio: regular rate and rhythm, S1, S2 normal, no murmur, click, rub or gallop  GI: soft, non-tender; bowel sounds normal; no masses, no organomegaly  Extremities: extremities normal, atraumatic, no cyanosis or edema  Pulses: 2+ and symmetric  Lab Results: Lab Results  Component Value Date   WBC 5.5 11/19/2011   HGB 14.5 11/19/2011   HCT 41.5 11/19/2011   MCV 86.5 11/19/2011   PLT 199 11/19/2011    BMET  Lab 11/19/11 0720  NA 139  K 3.9  CL 108  CO2 24  BUN 8  CREATININE 0.73  LABGLOM --  GLUCOSE 243*  CALCIUM 8.7    Studies/Results: No results found.  Medications:  Current Facility-Administered Medications  Medication Dose Route Frequency Provider Last Rate Last Dose  . 0.9 %  sodium chloride infusion   Intravenous Continuous Laquenta Whitsell Jarrett-Davis 50 mL/hr at 11/19/11 1332    . acetaminophen (TYLENOL) tablet 650 mg  650 mg Oral Q6H PRN Dezyre Hoefer Jarrett-Davis       Or  . acetaminophen (TYLENOL) suppository  650 mg  650 mg Rectal Q6H PRN Hart Haas Jarrett-Davis      . alum & mag hydroxide-simeth (MAALOX/MYLANTA) 200-200-20 MG/5ML suspension 30 mL  30 mL Oral Q6H PRN Lloyd Cullinan Jarrett-Davis      . docusate sodium (COLACE) capsule 100 mg  100 mg Oral BID Jafeth Mustin Jarrett-Davis   100 mg at 11/20/11 0826  . enoxaparin (LOVENOX) injection 40 mg  40 mg Subcutaneous Q24H Curby Carswell Jarrett-Davis   40 mg at 11/20/11 0130  . insulin aspart (novoLOG) injection 0-15 Units  0-15 Units Subcutaneous Q4H Kevan Prouty Jarrett-Davis   2 Units at 11/20/11 1616  . ondansetron (ZOFRAN) tablet 4 mg  4 mg Oral Q6H PRN Namrata Dangler Jarrett-Davis       Or  . ondansetron (ZOFRAN) injection 4 mg  4 mg Intravenous Q6H PRN Zayli Villafuerte Jarrett-Davis      . sodium chloride 0.9 % 1,000 mL with thiamine (B-1) 100 mg, folic acid 1 mg infusion   Intravenous Once Ariadne Rissmiller Jarrett-Davis 75 mL/hr at 11/20/11 0826    . DISCONTD: insulin glargine (LANTUS) injection 15 Units  15 Units Subcutaneous QHS Vikrant Pryce Jarrett-Davis      . DISCONTD: insulin glargine (LANTUS) injection 8 Units  8 Units Subcutaneous QHS Jayleene Glaeser Jarrett-Davis   8 Units at 11/20/11 0130  . DISCONTD: sodium chloride 0.9 % bolus 1,000 mL  1,000 mL Intravenous Once Glynn Octave, MD        Assessment/Plan: Principal Problem: Altered mental status This has resolved. Active Problems:  Overdose of benzodiazepine Patient overdosed on his Klonopin and Flexeril. Will await for further recommendations from psychiatry.   Diabetes mellitus Cover with Lantus and sliding scale insulin.  Disposition: As per psychiatry recommendation.   LOS: 2 days   Earlene Plater MD, Ladell Pier 11/20/2011, 6:40 PM

## 2011-11-20 NOTE — Progress Notes (Signed)
Poison Control called to check on pt status. Gave current VS (stable) and mentation assessment.

## 2011-11-21 LAB — BASIC METABOLIC PANEL
CO2: 19 mEq/L (ref 19–32)
Calcium: 8.3 mg/dL — ABNORMAL LOW (ref 8.4–10.5)
Creatinine, Ser: 0.78 mg/dL (ref 0.50–1.35)
Glucose, Bld: 216 mg/dL — ABNORMAL HIGH (ref 70–99)

## 2011-11-21 LAB — CBC
MCH: 30.4 pg (ref 26.0–34.0)
MCHC: 35.4 g/dL (ref 30.0–36.0)
MCV: 86 fL (ref 78.0–100.0)
Platelets: 223 10*3/uL (ref 150–400)
RBC: 4.57 MIL/uL (ref 4.22–5.81)

## 2011-11-21 LAB — GLUCOSE, CAPILLARY
Glucose-Capillary: 193 mg/dL — ABNORMAL HIGH (ref 70–99)
Glucose-Capillary: 282 mg/dL — ABNORMAL HIGH (ref 70–99)
Glucose-Capillary: 228 mg/dL — ABNORMAL HIGH (ref 70–99)

## 2011-11-21 MED ORDER — INSULIN GLARGINE 100 UNIT/ML ~~LOC~~ SOLN
12.0000 [IU] | Freq: Every day | SUBCUTANEOUS | Status: DC
  Administered 2011-11-21 – 2011-11-22 (×2): 12 [IU] via SUBCUTANEOUS

## 2011-11-21 MED ORDER — POTASSIUM CHLORIDE CRYS ER 20 MEQ PO TBCR
40.0000 meq | EXTENDED_RELEASE_TABLET | Freq: Once | ORAL | Status: DC
  Filled 2011-11-21: qty 2

## 2011-11-21 NOTE — Progress Notes (Signed)
Has refused all meds today except for dinner insulin-this due to his wife being here-and then would only allow 4 units, not the 8 sliding scale stated. Refuses to eat-except for a few french fries that his wife got him to eat.

## 2011-11-21 NOTE — Progress Notes (Signed)
Subjective: Patient  without any complaints today.    Objective: Vital signs in last 24 hours: Temp:  [98.2 F (36.8 C)-98.4 F (36.9 C)] 98.2 F (36.8 C) (12/22 1609) Pulse Rate:  [95-107] 107  (12/22 1609) Resp:  [18] 18  (12/22 1609) BP: (115-125)/(77) 115/77 mmHg (12/22 1609) SpO2:  [100 %] 100 % (12/22 1609) Weight change:   -Intake/Output from previous day: 12/21 0701 - 12/22 0700 In: 570 [P.O.:120; I.V.:450] Out: 550 [Urine:550]  Blood pressure 115/77, pulse 107, temperature 98.2 F (36.8 C), temperature source Oral, resp. rate 18, height 6\' 1"  (1.854 m), weight 90.6 kg (199 lb 11.8 oz), SpO2 100.00%.  General appearance: appears stated age, no distress and uncooperative  Head: Normocephalic, without obvious abnormality, atraumatic  Eyes: conjunctivae/corneas clear. PERRL, EOM's intact. Fundi benign.  Resp: clear to auscultation bilaterally  Cardio: regular rate and rhythm, S1, S2 normal, no murmur, click, rub or gallop  GI: soft, non-tender; bowel sounds normal; no masses, no organomegaly  Extremities: extremities normal, atraumatic, no cyanosis or edema  Pulses: 2+ and symmetric  Lab Results: Lab Results  Component Value Date   WBC 4.5 11/21/2011   HGB 13.9 11/21/2011   HCT 39.3 11/21/2011   MCV 86.0 11/21/2011   PLT 223 11/21/2011    BMET  Lab 11/21/11 0540  NA 139  K 3.4*  CL 107  CO2 19  BUN 7  CREATININE 0.78  LABGLOM --  GLUCOSE 216*  CALCIUM 8.3*    Studies/Results: No results found.  Medications:  Current Facility-Administered Medications  Medication Dose Route Frequency Provider Last Rate Last Dose  . 0.9 %  sodium chloride infusion   Intravenous Continuous Phoenix Riesen Jarrett-Davis 50 mL/hr at 11/19/11 1332    . acetaminophen (TYLENOL) tablet 650 mg  650 mg Oral Q6H PRN Kymia Simi Jarrett-Davis       Or  . acetaminophen (TYLENOL) suppository 650 mg  650 mg Rectal Q6H PRN Lionell Matuszak Jarrett-Davis      . alum & mag hydroxide-simeth (MAALOX/MYLANTA)  200-200-20 MG/5ML suspension 30 mL  30 mL Oral Q6H PRN Areeba Sulser Jarrett-Davis      . docusate sodium (COLACE) capsule 100 mg  100 mg Oral BID Ellanie Oppedisano Jarrett-Davis   100 mg at 11/20/11 0826  . enoxaparin (LOVENOX) injection 40 mg  40 mg Subcutaneous Q24H Masako Overall Jarrett-Davis   40 mg at 11/20/11 0130  . haloperidol lactate (HALDOL) 5 MG/ML injection           . haloperidol lactate (HALDOL) injection 2 mg  2 mg Intramuscular Once Rolan Lipa      . insulin aspart (novoLOG) injection 0-15 Units  0-15 Units Subcutaneous Q4H Larayne Baxley Jarrett-Davis   4 Units at 11/21/11 1702  . insulin glargine (LANTUS) injection 12 Units  12 Units Subcutaneous QHS Micheala Morissette Jarrett-Davis      . ondansetron (ZOFRAN) tablet 4 mg  4 mg Oral Q6H PRN Zaidy Absher Jarrett-Davis       Or  . ondansetron (ZOFRAN) injection 4 mg  4 mg Intravenous Q6H PRN Haitham Dolinsky Jarrett-Davis      . potassium chloride SA (K-DUR,KLOR-CON) CR tablet 40 mEq  40 mEq Oral Once Murat Rideout Jarrett-Davis      . DISCONTD: insulin glargine (LANTUS) injection 8 Units  8 Units Subcutaneous QHS Allysa Governale Jarrett-Davis        Assessment/Plan: Principal Problem: Altered mental status This has resolved.  Active Problems:  Overdose of benzodiazepine Patient overdosed on his Klonopin and Flexeril. Will await for further recommendations from psychiatry. Noted  social work interview with patient.  Diabetes mellitus Cover with Lantus and sliding scale insulin.  Increased Lantus.  Disposition: As per psychiatry recommendation.   LOS: 3 days   Earlene Plater MD, Ladell Pier 11/21/2011, 5:18 PM

## 2011-11-21 NOTE — Progress Notes (Signed)
Spoke with patient regarding his IV.  I explained to patient that he needs IV access in case his sugar falls down too low but he refused to have IV restarted.

## 2011-11-21 NOTE — Progress Notes (Signed)
Pt complained of shoulder pain. When offered Tylenol for pain relief, pt got very agitated stating "I have a prescription for Vicodin at home". Pt got very irritated stating that it is "bullshit" that his primary care physician is not following his care at the hospital. Pt stated he was not going to allow anyone to "poke or prod" him anymore and refused all medications and CBGs. Pt then pulled his Foley catheter out. Pt refused for it to be reinserted. Pt then pulled IV out. Pt refused for IV to be restarted as well. Pt stated he was going to leave and jump off a building. Night coverage and AC notified. Orders received for IM Haldol. Pt refused the haldol. AC came and had a conversation with the pt. After that, the pt calmed down and rested comfortably for the rest of the night.

## 2011-11-21 NOTE — Progress Notes (Deleted)
CM spoke with pt's daughters concerning d/c planning. Per pt's daughters if pt goes home, request spanish speaking HH providers. CM contacted [facility] ( No spanish speaking providers), [facility] (NO spanish speaking providers), [facility] (no Spanish speaking providers). CM will continue to call [facility] health [facility] tomorrow. Daughters state willing to have patient go to skilled nursing facility if medicare will pay for it. According to recent conversation with CSW, Medicare will not pay for SNF.   Farah Lepak,RN,BSN weekend case manager [phone]  
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
## 2011-11-22 ENCOUNTER — Inpatient Hospital Stay (HOSPITAL_COMMUNITY): Payer: Medicaid Other

## 2011-11-22 LAB — GLUCOSE, CAPILLARY
Glucose-Capillary: 222 mg/dL — ABNORMAL HIGH (ref 70–99)
Glucose-Capillary: 263 mg/dL — ABNORMAL HIGH (ref 70–99)
Glucose-Capillary: 331 mg/dL — ABNORMAL HIGH (ref 70–99)
Glucose-Capillary: 342 mg/dL — ABNORMAL HIGH (ref 70–99)

## 2011-11-22 MED ORDER — ALPRAZOLAM 0.5 MG PO TABS
0.5000 mg | ORAL_TABLET | Freq: Once | ORAL | Status: AC
  Filled 2011-11-22: qty 1

## 2011-11-22 NOTE — Progress Notes (Signed)
Subjective: Patient complains of pain in his right shoulder.   Objective: Vital signs in last 24 hours: Temp:  [97.6 F (36.4 C)-98 F (36.7 C)] 98 F (36.7 C) (12/23 1500) Pulse Rate:  [99-106] 99  (12/23 1500) Resp:  [18-20] 18  (12/23 1500) BP: (113-115)/(60-63) 115/63 mmHg (12/23 1500) SpO2:  [100 %] 100 % (12/23 1500) Weight change:   -Intake/Output from previous day: 12/22 0701 - 12/23 0700 In: 360 [P.O.:360] Out: -   Blood pressure 115/63, pulse 99, temperature 98 F (36.7 C), temperature source Oral, resp. rate 18, height 6\' 1"  (1.854 m), weight 90.6 kg (199 lb 11.8 oz), SpO2 100.00%.  General appearance: appears stated age, no distress and uncooperative  Head: Normocephalic, without obvious abnormality, atraumatic  Eyes: conjunctivae/corneas clear. PERRL, EOM's intact. Fundi benign.  Resp: clear to auscultation bilaterally  Cardio: regular rate and rhythm, S1, S2 normal, no murmur, click, rub or gallop  GI: soft, non-tender; bowel sounds normal; no masses, no organomegaly  Extremities: extremities normal, atraumatic, no cyanosis or edema, decreased range of motion in the right shoulder.  Pulses: 2+ and symmetric  Lab Results: Lab Results  Component Value Date   WBC 4.5 11/21/2011   HGB 13.9 11/21/2011   HCT 39.3 11/21/2011   MCV 86.0 11/21/2011   PLT 223 11/21/2011    BMET  Lab 11/21/11 0540  NA 139  K 3.4*  CL 107  CO2 19  BUN 7  CREATININE 0.78  LABGLOM --  GLUCOSE 216*  CALCIUM 8.3*    Studies/Results: No results found.  Medications:  Current Facility-Administered Medications  Medication Dose Route Frequency Provider Last Rate Last Dose  . 0.9 %  sodium chloride infusion   Intravenous Continuous Phuc Kluttz Jarrett-Davis 50 mL/hr at 11/19/11 1332    . acetaminophen (TYLENOL) tablet 650 mg  650 mg Oral Q6H PRN Anasofia Micallef Jarrett-Davis   325 mg at 11/21/11 2124   Or  . acetaminophen (TYLENOL) suppository 650 mg  650 mg Rectal Q6H PRN Dietra Stokely  Jarrett-Davis      . ALPRAZolam (XANAX) tablet 0.5 mg  0.5 mg Oral Once Bayan Kushnir Jarrett-Davis      . alum & mag hydroxide-simeth (MAALOX/MYLANTA) 200-200-20 MG/5ML suspension 30 mL  30 mL Oral Q6H PRN Aeisha Minarik Jarrett-Davis   30 mL at 11/22/11 0113  . docusate sodium (COLACE) capsule 100 mg  100 mg Oral BID Onyinyechi Huante Jarrett-Davis   100 mg at 11/20/11 0826  . enoxaparin (LOVENOX) injection 40 mg  40 mg Subcutaneous Q24H Ciin Brazzel Jarrett-Davis   40 mg at 11/20/11 0130  . haloperidol lactate (HALDOL) injection 2 mg  2 mg Intramuscular Once Rolan Lipa      . insulin aspart (novoLOG) injection 0-15 Units  0-15 Units Subcutaneous Q4H Elwin Tsou Jarrett-Davis   11 Units at 11/22/11 1244  . insulin glargine (LANTUS) injection 12 Units  12 Units Subcutaneous QHS Dulcinea Kinser Jarrett-Davis   12 Units at 11/21/11 2126  . ondansetron (ZOFRAN) tablet 4 mg  4 mg Oral Q6H PRN Kashton Mcartor Jarrett-Davis       Or  . ondansetron (ZOFRAN) injection 4 mg  4 mg Intravenous Q6H PRN Kentavius Dettore Jarrett-Davis      . potassium chloride SA (K-DUR,KLOR-CON) CR tablet 40 mEq  40 mEq Oral Once Cayden Rautio Jarrett-Davis      . DISCONTD: insulin glargine (LANTUS) injection 8 Units  8 Units Subcutaneous QHS Laakea Pereira Jarrett-Davis        Assessment/Plan: Principal Problem: Altered mental status This has resolved.  Active Problems:  Overdose of benzodiazepine Patient overdosed on his Klonopin and Flexeril. Will await for further recommendations from psychiatry. Noted social work interview with patient.  Diabetes mellitus Cover with Lantus and sliding scale insulin.   Shoulder pain: X-ray of the shoulder is negative. Ordered MRI to rule out rotator cuff tear. Disposition: As per psychiatry recommendation.   LOS: 4 days   Earlene Plater MD, Ladell Pier 11/22/2011, 4:42 PM

## 2011-11-23 ENCOUNTER — Encounter (HOSPITAL_COMMUNITY): Payer: Self-pay | Admitting: *Deleted

## 2011-11-23 ENCOUNTER — Inpatient Hospital Stay (HOSPITAL_COMMUNITY): Payer: Medicaid Other

## 2011-11-23 ENCOUNTER — Inpatient Hospital Stay (HOSPITAL_COMMUNITY)
Admission: AD | Admit: 2011-11-23 | Discharge: 2011-11-27 | DRG: 885 | Disposition: A | Discharge status: Home / Self Care | Payer: Medicaid Other | Source: Ambulatory Visit | Attending: Psychiatry | Admitting: Psychiatry

## 2011-11-23 DIAGNOSIS — T438X2A Poisoning by other psychotropic drugs, intentional self-harm, initial encounter: Secondary | ICD-10-CM

## 2011-11-23 DIAGNOSIS — G609 Hereditary and idiopathic neuropathy, unspecified: Secondary | ICD-10-CM

## 2011-11-23 DIAGNOSIS — R739 Hyperglycemia, unspecified: Secondary | ICD-10-CM

## 2011-11-23 DIAGNOSIS — F329 Major depressive disorder, single episode, unspecified: Principal | ICD-10-CM

## 2011-11-23 DIAGNOSIS — K219 Gastro-esophageal reflux disease without esophagitis: Secondary | ICD-10-CM

## 2011-11-23 DIAGNOSIS — T43502A Poisoning by unspecified antipsychotics and neuroleptics, intentional self-harm, initial encounter: Secondary | ICD-10-CM

## 2011-11-23 DIAGNOSIS — T424X4A Poisoning by benzodiazepines, undetermined, initial encounter: Secondary | ICD-10-CM

## 2011-11-23 DIAGNOSIS — F341 Dysthymic disorder: Secondary | ICD-10-CM | POA: Diagnosis present

## 2011-11-23 DIAGNOSIS — Z794 Long term (current) use of insulin: Secondary | ICD-10-CM

## 2011-11-23 DIAGNOSIS — I10 Essential (primary) hypertension: Secondary | ICD-10-CM

## 2011-11-23 DIAGNOSIS — E7800 Pure hypercholesterolemia, unspecified: Secondary | ICD-10-CM

## 2011-11-23 DIAGNOSIS — Z79899 Other long term (current) drug therapy: Secondary | ICD-10-CM

## 2011-11-23 DIAGNOSIS — E119 Type 2 diabetes mellitus without complications: Secondary | ICD-10-CM

## 2011-11-23 DIAGNOSIS — T424X1A Poisoning by benzodiazepines, accidental (unintentional), initial encounter: Secondary | ICD-10-CM | POA: Diagnosis present

## 2011-11-23 HISTORY — DX: Depression, unspecified: F32.A

## 2011-11-23 HISTORY — DX: Major depressive disorder, single episode, unspecified: F32.9

## 2011-11-23 LAB — GLUCOSE, CAPILLARY
Glucose-Capillary: 211 mg/dL — ABNORMAL HIGH (ref 70–99)
Glucose-Capillary: 383 mg/dL — ABNORMAL HIGH (ref 70–99)

## 2011-11-23 MED ORDER — MAGNESIUM HYDROXIDE 400 MG/5ML PO SUSP: 30.0000 mL | Freq: Every day | ORAL | Status: DC | PRN

## 2011-11-23 MED ORDER — ALUM & MAG HYDROXIDE-SIMETH 200-200-20 MG/5ML PO SUSP
30.0000 mL | ORAL | Status: DC | PRN
  Administered 2011-11-23: 30 mL via ORAL

## 2011-11-23 MED ORDER — INSULIN GLARGINE 100 UNIT/ML ~~LOC~~ SOLN
20.0000 [IU] | Freq: Every day | SUBCUTANEOUS | Status: DC
Start: 2011-11-23 — End: 2011-11-26
  Administered 2011-11-23 – 2011-11-24 (×2): 20 [IU] via SUBCUTANEOUS
  Filled 2011-11-23: qty 3

## 2011-11-23 MED ORDER — ALPRAZOLAM 0.5 MG PO TABS
0.5000 mg | ORAL_TABLET | Freq: Once | ORAL | Status: AC
  Administered 2011-11-23: 0.5 mg via ORAL
  Filled 2011-11-23: qty 1

## 2011-11-23 MED ORDER — ACETAMINOPHEN 325 MG PO TABS
650.0000 mg | ORAL_TABLET | Freq: Four times a day (QID) | ORAL | Status: DC | PRN
  Administered 2011-11-25 – 2011-11-27 (×4): 650 mg via ORAL
  Filled 2011-11-23: qty 2

## 2011-11-23 MED ORDER — INSULIN ASPART 100 UNIT/ML ~~LOC~~ SOLN
0.0000 [IU] | Freq: Three times a day (TID) | SUBCUTANEOUS | Status: DC
  Administered 2011-11-24: 11 [IU] via SUBCUTANEOUS
  Administered 2011-11-24: 8 [IU] via SUBCUTANEOUS
  Administered 2011-11-24 – 2011-11-25 (×2): 5 [IU] via SUBCUTANEOUS
  Administered 2011-11-25: 8 [IU] via SUBCUTANEOUS
  Administered 2011-11-25: 15 [IU] via SUBCUTANEOUS
  Administered 2011-11-26: 11 [IU] via SUBCUTANEOUS
  Administered 2011-11-26: 15 [IU] via SUBCUTANEOUS
  Filled 2011-11-23: qty 1

## 2011-11-23 MED ORDER — INSULIN GLARGINE 100 UNIT/ML ~~LOC~~ SOLN: 20.0000 [IU] | Freq: Every day | SUBCUTANEOUS | Status: DC

## 2011-11-23 MED ORDER — INSULIN ASPART 100 UNIT/ML ~~LOC~~ SOLN: 0.0000 [IU] | Freq: Three times a day (TID) | SUBCUTANEOUS | Status: DC

## 2011-11-23 NOTE — Progress Notes (Signed)
Pt agreeable to admission to [facility].    Faxed signed consents to [facility].  Awaiting bed availability.  Providence Crosby, LCSWA Clinical Social Work [phone]

## 2011-11-23 NOTE — Progress Notes (Signed)
55 year old [place] American male admitted for depression following an overdose of Klonopin.  Patient appears severely depressed with a flat/blunted affect.  Complains of pain in his right shoulder, states an MRI was done and showed that he had a tear in his shoulder.  States he takes Vicodin at home to manage the pain.  States he has a hard time affording medications and gets most of his prescriptions through the prescription assistance program at [place].  Patient and wife are both currently unemployed and both are struggling with depression.  Patient admits to having a difficult time sleeping recently and a decreased appetite.  He was pleasant and cooperative with the admission process.  Patient is a diabetic and uses Lantus insulin, as well as Novolog on a sliding scale.  He was brought to the dining room following his skin assessment.

## 2011-11-23 NOTE — Progress Notes (Signed)
Patient ID: [name] [name], male   DOB: 27-Mar-1956, 55 y.o.   MRN: [id number] CBG [id number], writer notified Dr. Rogers Blocker, received order to give 20 units of Lantus that was scheduled for 2200 now and recheck blood glucose in 1 hr.

## 2011-11-23 NOTE — Progress Notes (Signed)
CSW met with pt to discuss psych's recommendation for transfer to [facility].  Pt stated he has been to [facility] in the past and would be willing to go, but wanted to talk to his doctor at [facility] before he makes his decision.  CSW discussed the process of voluntary and involuntary commitments.  Pt stated understanding. CSW still waiting on bed availability at [facility], and will follow up with pt once info is received.   Tamala Julian, [place]  [phone]
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
## 2011-11-23 NOTE — Progress Notes (Signed)
Patient ID: [name] [name], male   DOB: 1956/09/08, 55 y.o.   MRN: [id number] CBG 383, pt. Alert, denies pain, refuses drink. Staff will continue to monitor q68min for safety.

## 2011-11-23 NOTE — Progress Notes (Signed)
Per Elijah Birk at [facility], it's likely that [facility] will have a bed today.  [facility] anticipating d/c's today and will notified CSW when bed ready.  CSW to continue to follow.  Providence Crosby, LCSWA Clinical Social Work [phone]

## 2011-11-23 NOTE — Progress Notes (Signed)
Patient ID: [name] [name], male   DOB: [birth year], 55 y.o.   MRN: [id number] Pt. Offers very little information upon writer's introduction, only denies pain. Pt. Sad, makes no eye contact.  Writer offers support if needed. Pt. Refuses. Staff will continue to monitor q81min for safety.

## 2011-11-23 NOTE — Discharge Summary (Signed)
Physician Discharge Summary  Patient ID: [name] [name] MRN: [id number] DOB/AGE: 53-Oct-1957 13 y.o.  Admit date: 11/18/2011 Discharge date: 11/23/2011  Discharge Diagnoses:  Principal Problem:  *Altered mental status Active Problems:  Overdose of benzodiazepine  Diabetes mellitus Right Shoulder Pain  Discharge Medication List as of 11/23/2011  3:41 PM    CONTINUE these medications which have CHANGED   Details  insulin glargine (LANTUS SOLOSTAR) 100 UNIT/ML injection Inject 20 Units into the skin at bedtime., Starting 11/23/2011, Until Discontinued, Normal      CONTINUE these medications which have NOT CHANGED   Details  calcium carbonate (TUMS - DOSED IN MG ELEMENTAL CALCIUM) 500 MG chewable tablet Chew 1 tablet by mouth 3 (three) times daily as needed. As needed for indigestion. , Until Discontinued, Historical Med    insulin aspart (NOVOLOG FLEXPEN) 100 UNIT/ML injection Inject 2-20 Units into the skin 3 (three) times daily before meals. Sliding Scale Insulin. , Until Discontinued, Historical Med      STOP taking these medications     clonazePAM (KLONOPIN) 2 MG tablet      cyclobenzaprine (FLEXERIL) 10 MG tablet      HYDROcodone-acetaminophen (VICODIN) 5-500 MG per tablet         Discharge Orders    Future Orders Please Complete By Expires   Diet Carb Modified         Follow-up Information    Follow up with [facility] .         Disposition: Psych[facility] Hospital  Discharged Condition: stable   Consults:  Pysch  HPI: Patient is a 55 yo AAM that was admitted to the hospital with AMS after he overdosed on Klonipin and Flexeril.  PMH:  As per H & P FH: As per  H & P SH: As per H & P Med: as per H & P Allergies and ROS: as per H&P   Discharge Exam: Blood pressure 111/82, pulse 104, temperature 98.1 F (36.7 C), temperature source Oral, resp. rate 18, height 6\' 1"  (1.854 m), weight 90.6 kg (199 lb 11.8 oz), SpO2 100.00%. Gen:  Patient appears his stated  age. CV: RRR Lungs: CTA bilaterally EXT: no edema   Hospital Course:  Overdose Pt was admitted to the hospital.  Per Poison Control recommendation based on the conversation with the ER physician supportive care was recommended.  Patient mental improved back to baseline over the course of the hospitalization.  Psych evaluated patient and recommended inpatient Psych.  Patietn was transferred to [facility].  Diabetes Patient was discharged on Lantus and SSI for continued care of his diabetes.  Shoulder Pain Patient had MRI of his shoulder bursitis.  Outpatient Physical therapy was recommended.    Labs:   Results for orders placed during the hospital encounter of 11/18/11 (from the past 48 hour(s))  GLUCOSE, CAPILLARY     Status: Abnormal   Collection Time   11/21/11  7:58 PM      Component Value Range Comment   Glucose-Capillary 228 (*) 70 - 99 (mg/dL)    Comment 1 Documented in Chart      Comment 2 Notify RN     GLUCOSE, CAPILLARY     Status: Abnormal   Collection Time   11/22/11 12:02 AM      Component Value Range Comment   Glucose-Capillary 184 (*) 70 - 99 (mg/dL)    Comment 1 Documented in Chart      Comment 2 Notify RN     GLUCOSE, CAPILLARY  Status: Abnormal   Collection Time   11/22/11  3:53 AM      Component Value Range Comment   Glucose-Capillary 146 (*) 70 - 99 (mg/dL)    Comment 1 Documented in Chart      Comment 2 Notify RN     GLUCOSE, CAPILLARY     Status: Abnormal   Collection Time   11/22/11  9:25 AM      Component Value Range Comment   Glucose-Capillary 222 (*) 70 - 99 (mg/dL)   GLUCOSE, CAPILLARY     Status: Abnormal   Collection Time   11/22/11 12:15 PM      Component Value Range Comment   Glucose-Capillary 342 (*) 70 - 99 (mg/dL)    Comment 1 Notify RN     GLUCOSE, CAPILLARY     Status: Abnormal   Collection Time   11/22/11  3:46 PM      Component Value Range Comment   Glucose-Capillary 311 (*) 70 - 99 (mg/dL)    Comment 1 Notify RN      Comment 2  Documented in Chart     GLUCOSE, CAPILLARY     Status: Abnormal   Collection Time   11/22/11  7:48 PM      Component Value Range Comment   Glucose-Capillary 331 (*) 70 - 99 (mg/dL)   GLUCOSE, CAPILLARY     Status: Abnormal   Collection Time   11/22/11 11:33 PM      Component Value Range Comment   Glucose-Capillary 263 (*) 70 - 99 (mg/dL)   GLUCOSE, CAPILLARY     Status: Abnormal   Collection Time   11/23/11  4:23 AM      Component Value Range Comment   Glucose-Capillary 202 (*) 70 - 99 (mg/dL)   GLUCOSE, CAPILLARY     Status: Abnormal   Collection Time   11/23/11  7:27 AM      Component Value Range Comment   Glucose-Capillary 211 (*) 70 - 99 (mg/dL)    Comment 1 Notify RN     GLUCOSE, CAPILLARY     Status: Abnormal   Collection Time   11/23/11 11:31 AM      Component Value Range Comment   Glucose-Capillary 325 (*) 70 - 99 (mg/dL)    Comment 1 Notify RN       Diagnostics:  Dg Chest 2 View  11/16/2011  *RADIOLOGY REPORT*  Clinical Data: Low blood sugar, hypertension.  CHEST - 2 VIEW  Comparison: 05/10/2011  Findings: Heart and mediastinal contours are within normal limits. No focal opacities or effusions.  No acute bony abnormality.  IMPRESSION: No active cardiopulmonary disease.  Original Report Authenticated By: Cyndie Chime, M.D.   Dg Shoulder Right  11/16/2011  *RADIOLOGY REPORT*  Clinical Data: Chronic pain.  No known trauma.  RIGHT SHOULDER - 2+ VIEW  Comparison: None  Findings: Early degenerative changes in the right [facility] joint with early spurring.  Glenohumeral joint is unremarkable. No acute bony abnormality.  Specifically, no fracture, subluxation, or dislocation.  Soft tissues are intact.  IMPRESSION: No acute bony abnormality.  Original Report Authenticated By: Cyndie Chime, M.D.   Dg Elbow Complete Left  11/16/2011  *RADIOLOGY REPORT*  Clinical Data: Chronic pain.  No known trauma.  LEFT ELBOW - COMPLETE 3+ VIEW  Comparison: None.  Findings: No acute bony  abnormality.  Specifically, no fracture, subluxation, or dislocation.  Soft tissues are intact.  No joint effusion.  IMPRESSION: No acute bony abnormality.  Original  Report Authenticated By: Cyndie Chime, M.D.   Ct Head Wo Contrast  11/18/2011  *RADIOLOGY REPORT*  Clinical Data: Altered mental status  CT HEAD WITHOUT CONTRAST  Technique:  Contiguous axial images were obtained from the base of the skull through the vertex without contrast.  Comparison: May 10, 2011  Findings: There is no midline shift, hydrocephalus, or mass effect. There is no acute hemorrhage or acute transcortical infarct.  The bony calvarium is intact.  The visualized sinuses are clear.There is minimal chronic diffuse atrophy.  IMPRESSION: No focal acute intracranial abnormality identified.  Original Report Authenticated By: Sherian Rein, M.D.   Mr Shoulder Right Wo Contrast  11/23/2011  *RADIOLOGY REPORT*  Clinical Data: Shoulder pain.  MRI OF THE RIGHT SHOULDER WITHOUT CONTRAST  Technique:  Multiplanar, multisequence MR imaging was performed. No intravenous contrast was administered.  Comparison: None.  Findings: The patient has mild appearing supraspinatus and infraspinatus tendinopathy.  There is no tear.  The subscapularis is unremarkable.  The long head of biceps tendon is intact.  No labral tear is identified.  Mild acromioclavicular degenerative change is noted. The acromion is type 2.  Small volume of fluid is present in the subacromial/subdeltoid bursa.  IMPRESSION:  1.  Mild appearing supraspinatus and infraspinatus tendinopathy without tear. 2.  Mild acromioclavicular degenerative disease. 3.  Small volume of fluid the subacromial/subdeltoid bursa compatible with bursitis.  Original Report Authenticated By: Bernadene Bell. D'ALESSIO, M.D.   Dg Chest Portable 1 View  11/18/2011  *RADIOLOGY REPORT*  Clinical Data: Altered mental status  PORTABLE CHEST - 1 VIEW  Comparison: 11/16/2011  Findings: Cardiomediastinal silhouette is  stable.  No acute infiltrate or pleural effusion.  No pulmonary edema.  The bony thorax is stable.  IMPRESSION: . No active disease.  No significant change.  Original Report Authenticated By: Natasha Mead, M.D.   Time spent with patient and doing this discharge is approx 35 minutes   Signed: Earlene Plater MD, Ladell Pier 11/23/2011, 6:11 PM

## 2011-11-23 NOTE — Progress Notes (Signed)
Per MD, Pt ready for d/c.  Contact [facility].  [facility] has no information on Pt.  Contacted psych MD.  Per psych MD, Pt can go to [facility] and asked that CSW notify [facility] that he will accept Pt.  Notified [facility] that Pt psych MD accepting Pt and that Pt is medically ready to d/c.  [facility] to contact CSW when a bed is available.  Providence Crosby, LCSWA Clinical Social Work [phone]

## 2011-11-24 ENCOUNTER — Encounter (HOSPITAL_COMMUNITY): Payer: Self-pay | Admitting: Physician Assistant

## 2011-11-24 DIAGNOSIS — F329 Major depressive disorder, single episode, unspecified: Principal | ICD-10-CM

## 2011-11-24 LAB — GLUCOSE, CAPILLARY: Glucose-Capillary: 358 mg/dL — ABNORMAL HIGH (ref 70–99)

## 2011-11-24 MED ORDER — SERTRALINE HCL 50 MG PO TABS
50.0000 mg | ORAL_TABLET | Freq: Every day | ORAL | Status: DC
  Administered 2011-11-24 – 2011-11-27 (×4): 50 mg via ORAL
  Filled 2011-11-24 (×5): qty 1
  Filled 2011-11-24: qty 14

## 2011-11-24 MED ORDER — PANTOPRAZOLE SODIUM 40 MG PO TBEC
40.0000 mg | DELAYED_RELEASE_TABLET | Freq: Every day | ORAL | Status: DC
  Administered 2011-11-24 – 2011-11-27 (×3): 40 mg via ORAL
  Filled 2011-11-24 (×4): qty 1
  Filled 2011-11-24: qty 14

## 2011-11-24 MED ORDER — PANTOPRAZOLE SODIUM 20 MG PO TBEC
20.0000 mg | DELAYED_RELEASE_TABLET | Freq: Every day | ORAL | Status: DC
  Filled 2011-11-24 (×2): qty 1

## 2011-11-24 MED ORDER — HYDROXYZINE HCL 25 MG PO TABS: 25.0000 mg | ORAL_TABLET | Freq: Three times a day (TID) | ORAL | Status: DC | PRN

## 2011-11-24 MED ORDER — VENLAFAXINE HCL ER 150 MG PO CP24: 150.0000 mg | ORAL_CAPSULE | Freq: Every day | ORAL | Status: DC

## 2011-11-24 NOTE — Progress Notes (Signed)
Pt appears to be sleeping, eyes closed, resp reg, unlabored.  Has voiced no c/o's tonight, will continue to monitor.

## 2011-11-24 NOTE — Progress Notes (Signed)
Patient ID: [name] [name], male   DOB: [birth year], 55 y.o.   MRN: [id number] Patient has been cooperative and pleasant tonight.   CBG tonight was 358.

## 2011-11-24 NOTE — Progress Notes (Signed)
Patient ID: [name] [name], male   DOB: 11-May-1956, 55 y.o.   MRN: [id number] Pt requested medication to sleep.  He reports his appetite is improving.  He reports his depression and his hoplessness is a 10.  He has had thoughts of self harm in the last 24 houirs and he does contract for safety.  Pt is asking why it takes so long to get medication started when he was transferred from another [facility] facility.

## 2011-11-24 NOTE — Progress Notes (Addendum)
Suicide Risk Assessment  Admission Assessment     Demographic factors:  Assessment Details Time of Assessment: Admission Information Obtained From: Patient Current Mental Status:  Current Mental Status: Self-harm thoughts Loss Factors:  Loss Factors: Financial problems / change in socioeconomic status Historical Factors:    Risk Reduction Factors:  Risk Reduction Factors: Responsible for children under 53 years of age;Sense of responsibility to family;Religious beliefs about death;Living with another person, especially a relative;Positive therapeutic relationship  CLINICAL FACTORS:   Depression:   Hopelessness Obsessive-Compulsive Disorder Chronic Pain Previous Psychiatric Diagnoses and Treatments Medical Diagnoses and Treatments/Surgeries  COGNITIVE FEATURES THAT CONTRIBUTE TO RISK:  No Cognitive risk factors noted.   SUICIDE RISK:   Moderate:  Frequent suicidal ideation with limited intensity, and duration, some specificity in terms of plans, no associated intent, good self-control, limited dysphoria/symptomatology, some risk factors present, and identifiable protective factors, including available and accessible social support.  Reason for admission: Patient got set off by not getting to see his brother he had not seen in 20 years.  This made his depression worse and he needed his medications adjusted.  Mental Status Examination and Plan: Patient notes shadows darting about his field of vision.  This sounds like anxiety Patient denies suicidal or homicidal ideation, hallucinations, illusions, or delusions. Patient engages with good eye contact, is able to focus adequately in a one to one setting, and has clear goal directed thoughts. Patient speaks with a natural conversational volume, rate, and tone. Anxiety was reported at 7 on a scale of 1 the least and 10 the most. Depression was reported at 6 on the same scale. Patient is oriented times 4, recent and remote memory  intact. Judgement: impaired due to his depresison Insight: impaired by his depreaaion Plan:  We will admit the patient for crisis stabilization and treatment. I talked to pt about restarting lower dose of Effexor or restarting Zoloft.  Discussed options of using ECT or light therapy as he has gotten very depressed and given up.  Pt chose to restart Zoloft as it did not make him a dead head like the Effexor does. I explained the risks and benefits of medication in detail.  We will continue on q. 15 checks the unit protocol. At this time there is no clinical indication for one-to-one observation as patient contract for safety and presents little risk to harm themself and others.  We will increase collateral information. I encourage patient to participate in group milieu therapy. Pt will be seen in treatment team meeting tomorrow morning for further treatment and appropriate discharge planning. Please see history and physical note for more detailed information ELOS: 3 to 5 days.   [name] [name] 11/24/2011, 4:53 PM

## 2011-11-24 NOTE — Discharge Planning (Signed)
New patient tracked me down and asked if I was Dr.  Charline Bills his intent is to get out of here today.  Lives with brother and his wife.  Apparently, [name] [name]'s misuse of meds resulted in admission to medical.  He denies SI.  Says supports came to visit last night and they all agreed that sis-in-law will hold meds and dispense.  States they are fine with him returning home.  Will follow up at  [facility].
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
## 2011-11-25 ENCOUNTER — Emergency Department (HOSPITAL_COMMUNITY)
Admit: 2011-11-25 | Discharge: 2011-11-25 | Discharge status: Against Medical Advice | Payer: Medicaid Other | Attending: Emergency Medicine | Admitting: Emergency Medicine

## 2011-11-25 ENCOUNTER — Encounter (HOSPITAL_COMMUNITY): Payer: Self-pay | Admitting: Emergency Medicine

## 2011-11-25 DIAGNOSIS — T424X4A Poisoning by benzodiazepines, undetermined, initial encounter: Secondary | ICD-10-CM

## 2011-11-25 DIAGNOSIS — T43591A Poisoning by other antipsychotics and neuroleptics, accidental (unintentional), initial encounter: Secondary | ICD-10-CM

## 2011-11-25 DIAGNOSIS — Z0389 Encounter for observation for other suspected diseases and conditions ruled out: Secondary | ICD-10-CM | POA: Insufficient documentation

## 2011-11-25 LAB — BASIC METABOLIC PANEL
BUN: 13 mg/dL (ref 6–23)
Chloride: 99 mEq/L (ref 96–112)
GFR calc non Af Amer: >90 mL/min (ref >90)
Glucose, Bld: 333 mg/dL — ABNORMAL HIGH (ref 70–99)
Potassium: 3.5 mEq/L (ref 3.5–5.1)

## 2011-11-25 LAB — GLUCOSE, CAPILLARY
Glucose-Capillary: 272 mg/dL — ABNORMAL HIGH (ref 70–99)
Glucose-Capillary: >600 mg/dL (ref 70–99)

## 2011-11-25 MED ORDER — SODIUM CHLORIDE 0.9 % IV BOLUS (SEPSIS)
2000.0000 mL | Freq: Once | INTRAVENOUS | Status: AC
  Administered 2011-11-25: 2000 mL via INTRAVENOUS

## 2011-11-25 MED ORDER — INSULIN GLARGINE 100 UNIT/ML ~~LOC~~ SOLN: 20.0000 [IU] | Freq: Every day | SUBCUTANEOUS | Status: DC

## 2011-11-25 MED ORDER — INSULIN GLARGINE 100 UNIT/ML ~~LOC~~ SOLN
44.0000 [IU] | Freq: Every day | SUBCUTANEOUS | Status: DC
  Administered 2011-11-25 – 2011-11-26 (×2): 44 [IU] via SUBCUTANEOUS

## 2011-11-25 NOTE — ED Provider Notes (Signed)
History     CSN: [id number]  Arrival date & time 11/25/11  1311   First MD Initiated Contact with Patient 11/25/11 1523      Chief Complaint  Patient presents with  . Hyperglycemia    (Consider location/radiation/quality/duration/timing/severity/associated sxs/prior treatment) HPI  Past Medical History  Diagnosis Date  . Diabetes mellitus   . Hypercholesterolemia   . GERD (gastroesophageal reflux disease)   . Hypertension   . Hypercholesteremia     hx of  . Hemorrhoids     hx of  . Peripheral neuropathy   . Depression     Past Surgical History  Procedure Date  . Tonsillectomy     Family History  Problem Relation Age of Onset  . Hypotension Mother     History  Substance Use Topics  . Smoking status: Never Smoker   . Smokeless tobacco: Not on file  . Alcohol Use: No      Review of Systems  Allergies  Review of patient's allergies indicates no known allergies.  Home Medications   Current Outpatient Rx  Name Route Sig Dispense Refill  . ARIPIPRAZOLE 2 MG PO TABS Oral Take 2 mg by mouth at bedtime.      Marland Kitchen PANTOPRAZOLE SODIUM 40 MG PO TBEC Oral Take 40 mg by mouth daily.      . VENLAFAXINE HCL 75 MG PO CP24 Oral Take 225 mg by mouth daily.      Marland Kitchen CALCIUM CARBONATE ANTACID 500 MG PO CHEW Oral Chew 1 tablet by mouth 3 (three) times daily as needed. As needed for indigestion.     . INSULIN ASPART 100 UNIT/ML [place] SOLN Subcutaneous Inject 2-20 Units into the skin 3 (three) times daily before meals. Sliding Scale Insulin.     . INSULIN GLARGINE 100 UNIT/ML [place] SOLN Subcutaneous Inject 20 Units into the skin at bedtime. 10 mL 0    BP 116/72  Pulse 95  Temp(Src) 97.8 F (36.6 C) (Oral)  Resp 20  Ht 5\' 9"  (1.753 m)  Wt 194 lb (87.998 kg)  BMI 28.65 kg/m2  SpO2 96%  Physical Exam  ED Course  Procedures (including critical care time) Emergency department treatment: IV fluids, 2 L. At 1505. CBG improved to 464. At 1637 the glucose was 333. At 18:50 CBG  was 240. His nightly, Lantus dose has been modified and entered into the record, as 44 units each bedtime Labs Reviewed  GLUCOSE, CAPILLARY - Abnormal; Notable for the following:    Glucose-Capillary 460 (*)    All other components within normal limits  GLUCOSE, CAPILLARY - Abnormal; Notable for the following:    Glucose-Capillary 383 (*)    All other components within normal limits  GLUCOSE, CAPILLARY - Abnormal; Notable for the following:    Glucose-Capillary 213 (*)    All other components within normal limits  GLUCOSE, CAPILLARY - Abnormal; Notable for the following:    Glucose-Capillary 347 (*)    All other components within normal limits  GLUCOSE, CAPILLARY - Abnormal; Notable for the following:    Glucose-Capillary 293 (*)    All other components within normal limits  GLUCOSE, CAPILLARY - Abnormal; Notable for the following:    Glucose-Capillary 358 (*)    All other components within normal limits  GLUCOSE, CAPILLARY - Abnormal; Notable for the following:    Glucose-Capillary 272 (*)    All other components within normal limits  GLUCOSE, CAPILLARY - Abnormal; Notable for the following:    Glucose-Capillary >600 (*)  All other components within normal limits  GLUCOSE, CAPILLARY - Abnormal; Notable for the following:    Glucose-Capillary >600 (*)    All other components within normal limits  GLUCOSE, CAPILLARY - Abnormal; Notable for the following:    Glucose-Capillary 464 (*)    All other components within normal limits  BASIC METABOLIC PANEL - Abnormal; Notable for the following:    Sodium 129 (*)    Glucose, Bld 333 (*)    All other components within normal limits  POCT CBG MONITORING  POCT CBG MONITORING  POCT CBG MONITORING   No results found.   1. Hyperglycemia       MDM  Hyperglycemia related to a lower than usual dose of Lantus. Patient is completely nontoxic without evidence for infection, metabolic instability or any discomfort        Flint Melter, MD 11/25/11 [phone]

## 2011-11-25 NOTE — Progress Notes (Addendum)
[facility] MD Progress Note  11/25/2011 12:20 PM  Rn reports that patient's 11:30 am BS reading is over 600. Per protocol, sliding scale coverage of high BS ends at 400. Patient is alert and oriented x 3. He is aware of situation. No presence of diaphoresis, stupor and or nervousness present on assessment. To assure appropriate evaluation of of this high blood sugar, patient is transferred to [facility] ED via EMS.   Diagnosis:   Axis I: Overdose on diazepines, Major depressive disorder Axis II: Deferred Axis III:  Past Medical History  Diagnosis Date  . Diabetes mellitus   . Hypercholesterolemia   . GERD (gastroesophageal reflux disease)   . Hypertension   . Hypercholesteremia     hx of  . Hemorrhoids     hx of  . Peripheral neuropathy    Axis IV: No changes Axis V: 11-20 some danger of hurting self or others possible OR occasionally fails to maintain minimal personal hygiene OR gross impairment in communication  ADL's:  Intact  Sleep:  6.5  Appetite: Fair  Suicidal Ideation:   Plan:  No  Intent:  No  Means:  No  Homicidal Ideation:   Plan:  No  Intent:  No  Means:  No   Mental Status: General Appearance /Behavior:  Casual Eye Contact:  Good Motor Behavior:  Normal Speech:  Normal Level of Consciousness:  Alert Mood:  Euthymic Affect:  Appropriate Anxiety Level:  Minimal Thought Process:  Coherent Thought Content:  WNL Perception:  Normal Judgment:  Fair Insight:  Present Cognition:  Orientation time, place and person Sleep:  Number of Hours: 6.5   Vital Signs:Blood pressure 100/67, pulse 105, temperature 97.1 F (36.2 C), temperature source Oral, resp. rate 18, height 5\' 9"  (1.753 m), weight 194 lb (87.998 kg).  Lab Results:  Results for orders placed during the hospital encounter of 11/23/11 (from the past 48 hour(s))  GLUCOSE, CAPILLARY     Status: Abnormal   Collection Time   11/23/11  9:13 PM      Component Value Range Comment   Glucose-Capillary 460 (*) 70 - 99 (mg/dL)   GLUCOSE, CAPILLARY     Status: Abnormal   Collection Time   11/23/11 10:28 PM      Component Value Range Comment   Glucose-Capillary 383 (*) 70 - 99 (mg/dL)   GLUCOSE, CAPILLARY     Status: Abnormal   Collection Time   11/24/11  6:02 AM      Component Value Range Comment   Glucose-Capillary 213 (*) 70 - 99 (mg/dL)   GLUCOSE, CAPILLARY     Status: Abnormal   Collection Time   11/24/11 12:16 PM      Component Value Range Comment   Glucose-Capillary 347 (*) 70 - 99 (mg/dL)    Comment 1 Documented in Chart      Comment 2 Notify RN     GLUCOSE, CAPILLARY     Status: Abnormal   Collection Time   11/24/11  5:18 PM      Component Value Range Comment   Glucose-Capillary 293 (*) 70 - 99 (mg/dL)   GLUCOSE, CAPILLARY     Status: Abnormal   Collection Time   11/24/11  9:03 PM      Component Value Range Comment   Glucose-Capillary 358 (*) 70 - 99 (mg/dL)   GLUCOSE, CAPILLARY     Status: Abnormal   Collection Time   11/25/11  6:11 AM      Component Value Range Comment  Glucose-Capillary 272 (*) 70 - 99 (mg/dL)    Comment 1 Notify RN           Treatment Plan Summary: Daily contact with patient to assess and evaluate symptoms and progress in treatment Medication management Administer Novolog insulin 15 units for BS > 400, transfer to [facility] Emergency room for evaluation of BS over 600.  Plan: Transfer to [facility] ED for BS > 600.  Armandina Stammer I 11/25/2011, 12:20 PM
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
## 2011-11-25 NOTE — ED Notes (Signed)
Called report to [facility] RN at [facility].

## 2011-11-25 NOTE — ED Notes (Signed)
Security called to take pt. To behavioral health.

## 2011-11-25 NOTE — Tx Team (Signed)
Interdisciplinary Treatment Plan Update (Adult)  Date:  11/25/2011  Time Reviewed:  10:54 AM   Progress in Treatment: Attending groups: Yes Participating in groups:  Yes Taking medication as prescribed: Yes Tolerating medication:  Yes Family/Significant othe contact made:  Consent given to talk with pt's wife Patient understands diagnosis:  Yes Discussing patient identified problems/goals with staff:  Yes Medical problems stabilized or resolved:  Yes Denies suicidal/homicidal ideation: Yes Issues/concerns per patient self-inventory:  None identified Other: N/A  New problem(s) identified: None Identified  Reason for Continuation of Hospitalization: Anxiety Depression Medication stabilization Suicidal ideation  Interventions implemented related to continuation of hospitalization: mood stabilization, medication monitoring and adjustment, group therapy and psycho education, safety checks q 15 mins  Additional comments: N/A  Estimated length of stay: 3-5 days  Discharge Plan: Pt will follow up at [facility] for medication management and therapy.    New goal(s): N/A  Review of initial/current patient goals per problem list:    1.  Goal(s): Reduce depressive symptoms  Met:  No  Target date: by discharge  As evidenced by: Reducing depression from a 10 to a 3 as reported by pt.   2.  Goal (s): Reduce/Eliminate suicidal ideation  Met:  No  Target date: by discharge  As evidenced by: pt reporting no SI.    3.  Goal(s): Reduce anxiety  Met:  No  Target date: by discharge  As evidenced by: Reduce anxiety from a 10 to a 3 as reported by pt.    Attendees: Patient:  [name] [name] 11/25/2011 10:55 AM   Family:     Physician:  Orson Aloe, MD  11/25/2011  10:54 AM   Nursing:   Cato Mulligan, RN 11/25/2011 10:55 AM   Case Manager:  Reyes Ivan, LCSWA 11/25/2011  10:54 AM   Counselor:  Angus Palms, LCSW 11/25/2011  10:54 AM   Other:  11/25/2011  10:54 AM   Other:      Other:     Other:      Scribe for Treatment Team:   Carmina Miller, 11/25/2011 , 10:54 AM

## 2011-11-25 NOTE — Progress Notes (Signed)
Pt attended discharge planning group and actively participated.  Pt presents with flat affect and depressed mood.  Pt was open to sharing reason for entering the hospital.  Pt states he became overwhelmed with all that is going on in his life right now, became depressed and had a "bad moment".  Pt states his wife is out of work which is causing a financial burden.  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  Pt states his wife loves children and often takes in single mothers, children and baby sits as well.  Pt states he also has 4 kids, 3 are adults and one is 66 years old.  Pt states he lives in [place] and has transportation.  Pt states that he follows up at [facility] for medication management and therapy.  Pt ranks depression at a 1 and anxiety at a 2-3.  Pt denies SI.  Pt is concerned about an appointment he is missing today to renew his orange card.  SW called and rescheduled pt's appointment to 1/16 at 9:45 am. SW will refer pt back to [facility] for follow up.    [name] [name], LCSWA 11/25/2011  10:24 AM

## 2011-11-25 NOTE — Progress Notes (Signed)
[facility] Group Notes: (Counselor/Nursing/MHT/Case Management/Adjunct)   Type of Therapy:  Group Therapy  Participation Level:  Minimal  Participation Quality:  Attentive  Affect:  Blunted  Cognitive:  Appropriate  Insight:  Limited  Engagement in Group: None   Engagement in Therapy:  None  Modes of Intervention:  Support and Exploration  Summary of Progress/Problems:  [name] [name]  was attentive but not engaged in group process. When addressed personally he did state that depression is the emotion that he struggles with most.   Lyn Hollingshead, Lyndee Hensen 11/25/2011  12:40 PM      Lyn Hollingshead, Lyndee Hensen 11/25/2011  12:39 PM

## 2011-11-25 NOTE — ED Notes (Signed)
Per EMS pt from Behavioral health with c/o of High blood sugar. Pt CBG was over 600 and given 15 units of Novolog at 12:20 ad came down to 520 before arrival to [facility] Sitter at bedside. Pt in no acute distress.

## 2011-11-25 NOTE — ED Notes (Signed)
Sitter present at bedside, pt from [facility]

## 2011-11-25 NOTE — Progress Notes (Addendum)
Patient ID: [name] [name], male   DOB: 1956/04/13, 55 y.o.   MRN: [id number] Pt is awake and active this AM. Pt denies SI/HI and is cooperative with staff. Pt is attending groups as well. Pt states that he had an appointment to renew his insurance card today. Writer informed Sports coach and it was rescheduled in January. Pt affect is somewhat anxious but his mood is appropriate to the situation. Writer will continue to monitor.  MHT reports that pt has a CBG of >600 according to monitor. CBG retaken to confirm abnormal level. NP notified and pt transported to ER per protocol. 15 units of insulin given. Pt CBG dropped to 520 according to EMS at 1220. Pt was asymptomatic but said that he felt a "little weird," but could not describe it. Pt departed the unit at 1230. Pt does not have anyone listed as a point of contact regarding emergencies.

## 2011-11-25 NOTE — ED Notes (Signed)
accuchek 228.

## 2011-11-25 NOTE — ED Notes (Signed)
accuchek 240

## 2011-11-25 NOTE — ED Notes (Signed)
Report received from A. Louisa Second.  Pt. Resting comfortably.  Alert and oriented. No IV access established and No MD signed up for patient care.

## 2011-11-25 NOTE — ED Notes (Signed)
Attempted IV access.  Pt. Reports he is a hard stick and nurses usually have to try numerous times.  Attempted x 1.  Called IV nurse for assistance.  No labs obtained at this time.

## 2011-11-26 DIAGNOSIS — R7309 Other abnormal glucose: Secondary | ICD-10-CM

## 2011-11-26 DIAGNOSIS — F341 Dysthymic disorder: Secondary | ICD-10-CM | POA: Diagnosis present

## 2011-11-26 LAB — GLUCOSE, CAPILLARY
Glucose-Capillary: 228 mg/dL — ABNORMAL HIGH (ref 70–99)
Glucose-Capillary: 240 mg/dL — ABNORMAL HIGH (ref 70–99)
Glucose-Capillary: 207 mg/dL — ABNORMAL HIGH (ref 70–99)
Glucose-Capillary: 299 mg/dL — ABNORMAL HIGH (ref 70–99)

## 2011-11-26 MED ORDER — INSULIN ASPART 100 UNIT/ML ~~LOC~~ SOLN: 6.0000 [IU] | Freq: Three times a day (TID) | SUBCUTANEOUS | Status: DC

## 2011-11-26 MED ORDER — INSULIN ASPART 100 UNIT/ML ~~LOC~~ SOLN
0.0000 [IU] | Freq: Every day | SUBCUTANEOUS | Status: DC
Start: 2011-11-26 — End: 2011-11-27
  Administered 2011-11-26: 3 [IU] via SUBCUTANEOUS
  Filled 2011-11-26: qty 3

## 2011-11-26 MED ORDER — INSULIN ASPART 100 UNIT/ML ~~LOC~~ SOLN
6.0000 [IU] | Freq: Three times a day (TID) | SUBCUTANEOUS | Status: DC
  Administered 2011-11-26 – 2011-11-27 (×2): 6 [IU] via SUBCUTANEOUS

## 2011-11-26 MED ORDER — INSULIN ASPART 100 UNIT/ML ~~LOC~~ SOLN
0.0000 [IU] | Freq: Three times a day (TID) | SUBCUTANEOUS | Status: DC
  Administered 2011-11-26: 20 [IU] via SUBCUTANEOUS
  Administered 2011-11-27: 7 [IU] via SUBCUTANEOUS

## 2011-11-26 NOTE — Progress Notes (Signed)
Patient ID: [name] [name], male   DOB: 1955-12-17, 55 y.o.   MRN: [id number] Pt went to the ED yesterday because is blood sugars were too high.  They readjusted his insulin.  His sugars have been under better control now.  He reported that is mood is picking up on the Zoloft.  He feels that this is pretty sudden, but he is pleased with this effect.  Talked to him about taking on too much stuff.  He acknowledges that as a problem for him.  He denies any si/hi/a/vhal.

## 2011-11-26 NOTE — Progress Notes (Signed)
Pt attended discharge planning group and actively participated.  Pt reports feeling better today and thinks the adjustments to the meds has helped.  Pt ranks depression at a 1 today and denies anxiety and SI.  Pt states he feels ready to d/c soon and looks forward to getting home to take care of his medical issues.  Pt states his wife wants him home to help with the kids as well.  Pt will follow up at [facility].  SW scheduled pt's appointments with Lauree Chandler for therapy and Dr. Dicky Doe for medication management.  Pt states he is also trying to get disability and has a lawyer currently working on the appeal.  Pt will return home and wife will transport pt home. Safety planning and suicide prevention discussed.  No further d/c needs voiced at this time.   Reyes Ivan, LCSWA 11/26/2011  10:44 AM

## 2011-11-26 NOTE — Progress Notes (Signed)
Pt. Was in the Emergency room due to CBG >600 earlier this day.  Pt. Returned to [facility] at appr. 22:00 pm.  Writer was working with patients on the 400 hall and pt. Was already eating a snack before his CBG could be ckd.  Scientist, research (medical) encouraged pt. To drink a glass of water before we  ckd his CBG and adm. His HS lantus.    It was 299. Pt. Has no complaints of pain or discomfort noted at this time.  Still reporting his depression at a 5 but reports that as an improvement.  Negative SI, HI.

## 2011-11-26 NOTE — Progress Notes (Signed)
Inpatient Diabetes Program Recommendations  AACE/ADA: New Consensus Statement on Inpatient Glycemic Control (2009)  Target Ranges:  Prepandial:   less than 140 mg/dL      Peak postprandial:   less than 180 mg/dL (1-2 hours)      Critically ill patients:  140 - 180 mg/dL   Reason for Visit: Hyperglycemia  Inpatient Diabetes Program Recommendations Correction (SSI): Increase Novolog to resistant and add HS coverage Insulin - Meal Coverage: Add meal coverage insulin - Novolog 6 units tidwc Outpatient Referral: please order OP Diabetes Education consult - HgbA1C - 13.0 (11/18/2011) Diet: Encourage pt to make healthy choices and moderate portion sizes of food in cafeteria   Note: CBG this am - 305. Needs meal coverage insulin for improvement of blood sugar control.

## 2011-11-26 NOTE — Progress Notes (Signed)
Pt is pleasant and interacts well with peers and staff. Pt attends groups. Pt has been CBG has been running high all day. Last CBG was 437 and did speak with the doctor and was told just to give the last dose on the sliding scale. Pt was offered support and encouragement. Pt is receptive to treatment and safety maintained on unit.

## 2011-11-26 NOTE — Progress Notes (Signed)
[facility] Group Notes: (Counselor/Nursing/MHT/Case Management/Adjunct)   Type of Therapy: Group Therapy   Participation Level: Minimal   Participation Quality: Attentive   Affect: Appropriate   Cognitive: Appropriate   Insight: None   Engagement in Group: Minimal   Engagement in Therapy: None   Modes of Intervention: Support and Exploration   Summary of Progress/Problems: Song was attentive but not engaged in group process. He did appear to be following along with handout on mindfulness skills.   [name]  11/26/2011 4:18 PM

## 2011-11-26 NOTE — Progress Notes (Signed)
[facility] Group Notes: (Counselor/Nursing/MHT/Case Management/Adjunct)   Type of Therapy:  Group Therapy  Participation Level:  None  Participation Quality:  Attentive  Affect:  Appropriate  Cognitive:  Appropriate  Insight:  None  Engagement in Group: Minimal  Engagement in Therapy:  None  Modes of Intervention:  Support and Exploration  Summary of Progress/Problems: [name] [name]  was attentive but not engaged in group process    [name] 11/26/2011  12:45 PM

## 2011-11-27 LAB — GLUCOSE, CAPILLARY: Glucose-Capillary: 80 mg/dL (ref 70–99)

## 2011-11-27 MED ORDER — INSULIN ASPART 100 UNIT/ML ~~LOC~~ SOLN: 6.0000 [IU] | Freq: Three times a day (TID) | SUBCUTANEOUS | Status: DC

## 2011-11-27 MED ORDER — INSULIN GLARGINE 100 UNIT/ML ~~LOC~~ SOLN: 44.0000 [IU] | Freq: Every day | SUBCUTANEOUS | Status: DC

## 2011-11-27 MED ORDER — SERTRALINE HCL 50 MG PO TABS: 50.0000 mg | ORAL_TABLET | Freq: Every day | ORAL | Status: DC

## 2011-11-27 NOTE — Progress Notes (Signed)
[facility] Group Notes: (Counselor/Nursing/MHT/Case Management/Adjunct)   Type of Therapy:  Group Therapy  Participation Level:  Active  Participation Quality:  Attentive, Appropriate, Supportive, Sharing  Affect:  Appropriate  Cognitive:  Appropriate  Insight:  Good  Engagement in Group: Good  Engagement in Therapy:  Good  Modes of Intervention:  Support and Exploration  Summary of Progress/Problems: [name] [name] explored how the groups he has been a part of have helped him gain perspective on his role in the family. He stated that last night he had "an epiphany" that coming to the hospital and getting help does not mean he has let go of his role as the head of the family, but that he has made a responsible decision to take steps to ensure that he can perform the role more effectively. [name] [name] was supportive to others who processed grief and explored what it means to be strong.   [name] [name] 11/27/2011  1:08 PM

## 2011-11-27 NOTE — Progress Notes (Signed)
Patient ID: [name] [name], male   DOB: 1956-01-07, 55 y.o.   MRN: [id number]   Patient excited about discharge. No SI/HI or a/v hallucinations. Obtained all belongings, sample meds, prescriptions, and f/u appointments. Treatment felt patient ready for discharge.

## 2011-11-27 NOTE — Progress Notes (Signed)
[facility] Adult Inpatient Family/Significant Other Suicide Prevention Education  Suicide Prevention Education:  Education Completed; [name] [name], wife, has been identified by the patient as the family member/significant other with whom the patient will be residing, and identified as the person(s) who will aid the patient in the event of a mental health crisis (suicidal ideations/suicide attempt).  With written consent from the patient, the family member/significant other has been provided the following suicide prevention education, prior to the and/or following the discharge of the patient.  The suicide prevention education provided includes the following:  Suicide risk factors  Suicide prevention and interventions  National Suicide Hotline telephone number  [facility] assessment telephone number  [facility] Emergency Assistance 911  [facility] and/or Residential Mobile Crisis Unit telephone number  Request made of family/significant other to:  Remove weapons (e.g., guns, rifles, knives), all items previously/currently identified as safety concern.    Remove drugs/medications (over-the-counter, prescriptions, illicit drugs), all items previously/currently identified as a safety concern.  Wife has no concerns that [name] [name] is a danger to himself or others at this time. She reported that as long as he stays on his medications and follows up with appointments he is fine, and that she is very supportive of these aftercare measures. Wife verbalized understanding of suicide prevention education and had no questions. She reports that [name] [name] has no access to guns or other weapons, and that their home is secure for him to return.   [name] [name] 11/27/2011, 9:10 AM

## 2011-11-27 NOTE — Tx Team (Signed)
Interdisciplinary Treatment Plan Update (Adult)  Date:  11/27/2011  Time Reviewed:  10:15 AM   Progress in Treatment: Attending groups: Yes Participating in groups:  Yes Taking medication as prescribed: Yes Tolerating medication:  Yes Family/Significant othe contact made: Counselor plans to make contact today Patient understands diagnosis:  Yes Discussing patient identified problems/goals with staff:  Yes Medical problems stabilized or resolved:  Yes Denies suicidal/homicidal ideation: Yes Issues/concerns per patient self-inventory:  None identified Other: N/A  New problem(s) identified: None Identified  Reason for Continuation of Hospitalization: Stable to d/c  Interventions implemented related to continuation of hospitalization: Stable to d/c  Additional comments: N/A  Estimated length of stay: D/C today  Discharge Plan: Pt will follow up at [facility] for medication management and therapy.    New goal(s): N/A  Review of initial/current patient goals per problem list:    1.  Goal(s): Reduce depressive symptoms  Met:  Yes  Target date: by discharge  As evidenced by: Reducing depression from a 10 to a 3 as reported by pt.  Pt ranks at a 2 today.    2.  Goal (s): Reduce/Eliminate suicidal ideation  Met:  Yes  Target date: by discharge  As evidenced by: pt reporting no SI.  Pt denies SI.    3.  Goal(s): Reduce anxiety  Met:  Yes  Target date: by discharge  As evidenced by: Reduce anxiety from a 10 to a 3 as reported by pt. Pt ranks at a 0 today.     Attendees: Patient:  [name] [name] 11/27/2011 10:34 AM   Family:     Physician:  Orson Aloe, MD  11/27/2011  10:15 AM   Nursing:   Omelia Blackwater, RN 11/27/2011 10:20 AM   Case Manager:  Reyes Ivan, LCSWA 11/27/2011  10:15 AM   Counselor:  Angus Palms, LCSW 11/27/2011  10:15 AM   Other:  Vanetta Mulders, LPCA 11/27/2011  10:15 AM   Other:     Other:     Other:      Scribe for Treatment Team:     Carmina Miller, 11/27/2011 , 10:15 AM

## 2011-11-27 NOTE — Discharge Summary (Signed)
Pt attended discharge planning group and actively participated.  Pt presents with calm mood and affect.  Pt reports feeling stable to d/c today.  Pt states he isn't feeling too well, with a cold or allergies this morning.  Pt ranks depression and anxiety at a 0 and denies SI.  Pt reports plan to exercise and keep follow up appointments to stay well.  Pt will follow up at [facility] for medication management and therapy.  Pt to return to own home and has transportation.  No other d/c needs voiced.  Safety planning and suicide prevention discussed.     [name] [name], [place] 11/27/2011  10:37 AM

## 2011-11-27 NOTE — Discharge Summary (Signed)
Physician Discharge Summary Note  Patient:  [name] [name] is an 55 y.o., male DOB:  24-Jul-1956  Date of Admission:  11/23/2011  Date of Discharge: 11/27/11  Axis Diagnosis:   Axis I: Overdose of benzodiazepine Axis II: Deferred Axis III:  Past Medical History  Diagnosis Date  . Diabetes mellitus   . Hypercholesterolemia   . GERD (gastroesophageal reflux disease)   . Hypertension   . Hypercholesteremia     hx of  . Hemorrhoids     hx of  . Peripheral neuropathy   . Depression    Axis IV: No changes Axis V: 65  Level of Care:  OP  Discharge destination:  Home  Is patient on multiple antipsychotic therapies at discharge:  No    Has Patient had three or more failed trials of antipsychotic monotherapy by history:  No  Patient phone:  [phone] (home)  Patient address:   [address] [place] [address],   Follow-up recommendations:  Other:  Take medications as instructed by your Doctor.  Comments:   Follow-up Information    Follow up with [facility] - Dr. Dicky Doe on 11/30/2011. (Appointment scheduled at 8:45 am)    Contact information:   [address], [place] [address] [phone]      Follow up with [name] - Ardelle Balls (therapist) on 12/03/2011. (Appointment scheduled at 3:00 pm)    Contact information:   [address], [place] [address] [phone]         The patient received suicide prevention pamphlet:  Yes Belongings returned:  Valuables  [name] 11/27/2011, 2:22 PM Patient ID: [name] [name] MRN: [id number] DOB/AGE: [birth year] 55 y.o.  Admit date: 11/23/2011 Discharge date: 11/27/2011  Admission Diagnoses: Overdose of benzodiazepine   Hospital Course: Patient is a 55 year old African American male that was brought to the emergency room secondary to overdose on Klonopin and Flexeril. Per talking to the ER physician patient's significant other called EMS stating that he took 15 of his Klonopin pills. Patient is unable  to give history because of altered mental status. He moves all extremities opened his eyes but does not answer to questions. In the emergency room patient had a head CT that was negative.  While a patient here at [facility], patient received medication management and group counseling/activities. He showed improvement of symptoms by daily reports of decreased signs of suicidal ideation, depression and anxiety.      Discharge Diagnoses:  Principal Problem:  *Dysthymia Active Problems:  Overdose of benzodiazepine   Discharged Condition: Patient denies suicidal or homicidal ideation, hallucinations, illusions, or delusions.  Patient engages with good eye contact, is able to focus adequately in a one to one setting, and has clear goal directed thoughts.  Patient speaks with a natural conversational volume, rate, and tone.  Anxiety was reported at 1 on a scale of 1 the least and 10 the most.  Depression was reported at 1 on the same scale.  Patient is oriented times 4, recent and remote memory intact.  Judgement: Improved from admission  Insight: has recognized that helping others can be damaging to one's self and that group can be helpful, despite his desire to discuss things individually     Current Discharge Medication List    START taking these medications   Details  sertraline (ZOLOFT) 50 MG tablet Take 1 tablet (50 mg total) by mouth daily. Qty: 30 tablet, Refills: 0      CONTINUE these medications which have  CHANGED   Details  insulin aspart (NOVOLOG) 100 UNIT/ML injection Inject 6 Units into the skin 3 (three) times daily with meals. Qty: 1 vial, Refills: 0    insulin glargine (LANTUS) 100 UNIT/ML injection Inject 44 Units into the skin at bedtime. Qty: 10 mL, Refills: 0      CONTINUE these medications which have NOT CHANGED   Details  pantoprazole (PROTONIX) 40 MG tablet Take 40 mg by mouth daily.      calcium carbonate (TUMS - DOSED IN MG ELEMENTAL CALCIUM) 500 MG chewable  tablet Chew 1 tablet by mouth 3 (three) times daily as needed. As needed for indigestion.       STOP taking these medications     ARIPiprazole (ABILIFY) 2 MG tablet      venlafaxine (EFFEXOR-XR) 75 MG 24 hr capsule        Follow-up Information    Follow up with [facility] - Dr. Dicky Doe on 11/30/2011. (Appointment scheduled at 8:45 am)    Contact information:   [address], [place] [address] [phone]      Follow up with [name] - Ardelle Balls (therapist) on 12/03/2011. (Appointment scheduled at 3:00 pm)    Contact information:   [address], [place] [address] [phone]         Signed: Armandina Stammer I 11/27/2011, 2:22 PM

## 2011-11-27 NOTE — Progress Notes (Signed)
Recreation Therapy Group Note  Date: 11/27/2011         Time: 1415      Group Topic/Focus: The focus of the group is on enhancing the patients' ability to cope with stressors by understanding what coping is, why it is important, the negative effects of stress and developing healthier coping skills. Patients practice guided imagery, progressive muscle relaxation, and using affirmations to aid in relaxation.   Participation Level: Active  Participation Quality: Appropriate and Supportive  Affect: Anxious  Cognitive: Oriented   Additional Comments: Patient reports not sleeping well, as his roommate was up most of the night, and reports he knows he has a list of things to do upon returning home today. Patient encouraged to take it one step at a time and not focus too much on all the things he has to do. Patient receptive to the relaxation strategies discussed.   Dameisha Tschida 11/27/2011 2:48 PM

## 2011-11-27 NOTE — Progress Notes (Signed)
Pt presents this evening with an improved CBG of 258 at 9:11pm. This pt has been trending as high as 437 at 5pm Thursday evening. This pt actively attends groups and interacts with others appropriately in the dayroom. Pt did complained of the start of a possible cold, with symptotms of an itchy throat and runny nose. Pt has been encouraged to discuss these symptoms with his physician.  Pt denies any thoughts of SI/HI at this time. Support and availabity as needed has been extended to this patient. Pt safety remains with q50min checks.

## 2011-11-27 NOTE — Progress Notes (Signed)
Suicide Risk Assessment  Discharge Assessment     Demographic factors:  Assessment Details Time of Assessment: Admission Information Obtained From: Patient Current Mental Status:  Current Mental Status: Self-harm thoughts Risk Reduction Factors:  Risk Reduction Factors: Responsible for children under 53 years of age;Sense of responsibility to family;Religious beliefs about death;Living with another person, especially a relative;Positive therapeutic relationship  CLINICAL FACTORS:   Severe Anxiety and/or Agitation Previous Psychiatric Diagnoses and Treatments Medical Diagnoses and Treatments/Surgeries  COGNITIVE FEATURES THAT CONTRIBUTE TO RISK:  No Cognitive risk factors noted.   SUICIDE RISK:   Minimal: No identifiable suicidal ideation.  Patients presenting with no risk factors but with morbid ruminations; may be classified as minimal risk based on the severity of the depressive symptoms  Patient denies suicidal or homicidal ideation, hallucinations, illusions, or delusions. Patient engages with good eye contact, is able to focus adequately in a one to one setting, and has clear goal directed thoughts. Patient speaks with a natural conversational volume, rate, and tone. Anxiety was reported at 1 on a scale of 1 the least and 10 the most. Depression was reported at 1 on the same scale. Patient is oriented times 4, recent and remote memory intact. Judgement: Improved from admission Insight: has recognized that helping others can be damaging to one's self and that group can be helpful, despite his desire to discuss things individually. Plan: Remain on . insulin aspart  0-20 Units Subcutaneous TID WC  . insulin aspart  0-5 Units Subcutaneous QHS  . insulin aspart  6 Units Subcutaneous TID WC  . insulin glargine  44 Units Subcutaneous QHS  . pantoprazole  40 mg Oral Q1200  . sertraline  50 mg Oral Daily  . DISCONTD: insulin aspart  0-15 Units Subcutaneous TID WC  . DISCONTD:  insulin aspart  6 Units Subcutaneous TID WC  Because insulin helps control blood sugar levels, Protonix helps with acid reflux, Zoloft helps with mood Pt voices understanding of the risks and benefits of medication. Follow-up is with Follow-up Information    Follow up with [facility] - Dr. Dicky Doe on 11/30/2011. (Appointment scheduled at 8:45 am)    Contact information:   [address], [place] [address] [phone]      Follow up with [name] - Ardelle Balls (therapist) on 12/03/2011. (Appointment scheduled at 3:00 pm)    Contact information:   [address], [place] [address] [phone]        [name] [name], [name] [name] 11/27/2011, 11:39 AM

## 2011-11-27 NOTE — Progress Notes (Signed)
[facility] Group Notes:  (Counselor/Nursing/MHT/Case Management/Adjunct)    Type of Therapy:  Group Therapy  Participation Level:  Did Not Attend   Kipp Shank Taree Hayes 11/27/2011  3:48 PM   

## 2011-11-30 NOTE — Progress Notes (Signed)
Patient Discharge Instructions:  No signed consent for [name] [name], 11/30/2011, 11:37 AM

## 2011-12-24 ENCOUNTER — Encounter (HOSPITAL_COMMUNITY): Payer: Self-pay | Admitting: Adult Health

## 2011-12-24 ENCOUNTER — Inpatient Hospital Stay (HOSPITAL_COMMUNITY)
Admission: EM | Admit: 2011-12-24 | Discharge: 2011-12-26 | DRG: 638 | Disposition: A | Discharge status: Home / Self Care | Payer: Medicaid Other | Attending: Internal Medicine | Admitting: Internal Medicine

## 2011-12-24 ENCOUNTER — Other Ambulatory Visit: Payer: Self-pay

## 2011-12-24 DIAGNOSIS — M25519 Pain in unspecified shoulder: Secondary | ICD-10-CM | POA: Diagnosis present

## 2011-12-24 DIAGNOSIS — E1165 Type 2 diabetes mellitus with hyperglycemia: Secondary | ICD-10-CM | POA: Diagnosis present

## 2011-12-24 DIAGNOSIS — R739 Hyperglycemia, unspecified: Secondary | ICD-10-CM

## 2011-12-24 DIAGNOSIS — E1149 Type 2 diabetes mellitus with other diabetic neurological complication: Secondary | ICD-10-CM | POA: Diagnosis present

## 2011-12-24 DIAGNOSIS — E876 Hypokalemia: Secondary | ICD-10-CM | POA: Diagnosis present

## 2011-12-24 DIAGNOSIS — X58XXXA Exposure to other specified factors, initial encounter: Secondary | ICD-10-CM | POA: Diagnosis present

## 2011-12-24 DIAGNOSIS — M75101 Unspecified rotator cuff tear or rupture of right shoulder, not specified as traumatic: Secondary | ICD-10-CM | POA: Diagnosis present

## 2011-12-24 DIAGNOSIS — E7800 Pure hypercholesterolemia, unspecified: Secondary | ICD-10-CM | POA: Diagnosis present

## 2011-12-24 DIAGNOSIS — E1142 Type 2 diabetes mellitus with diabetic polyneuropathy: Secondary | ICD-10-CM | POA: Diagnosis present

## 2011-12-24 DIAGNOSIS — R45851 Suicidal ideations: Secondary | ICD-10-CM

## 2011-12-24 DIAGNOSIS — F329 Major depressive disorder, single episode, unspecified: Secondary | ICD-10-CM | POA: Diagnosis present

## 2011-12-24 DIAGNOSIS — F32A Depression, unspecified: Secondary | ICD-10-CM

## 2011-12-24 DIAGNOSIS — I498 Other specified cardiac arrhythmias: Secondary | ICD-10-CM | POA: Diagnosis present

## 2011-12-24 DIAGNOSIS — K219 Gastro-esophageal reflux disease without esophagitis: Secondary | ICD-10-CM | POA: Diagnosis present

## 2011-12-24 DIAGNOSIS — Z794 Long term (current) use of insulin: Secondary | ICD-10-CM

## 2011-12-24 DIAGNOSIS — E871 Hypo-osmolality and hyponatremia: Secondary | ICD-10-CM | POA: Diagnosis present

## 2011-12-24 DIAGNOSIS — S43429A Sprain of unspecified rotator cuff capsule, initial encounter: Secondary | ICD-10-CM | POA: Diagnosis present

## 2011-12-24 DIAGNOSIS — E1100 Type 2 diabetes mellitus with hyperosmolarity without nonketotic hyperglycemic-hyperosmolar coma (NKHHC): Principal | ICD-10-CM | POA: Diagnosis present

## 2011-12-24 DIAGNOSIS — F3289 Other specified depressive episodes: Secondary | ICD-10-CM | POA: Diagnosis present

## 2011-12-24 LAB — BASIC METABOLIC PANEL
Calcium: 9.5 mg/dL (ref 8.4–10.5)
Calcium: 9.7 mg/dL (ref 8.4–10.5)
Creatinine, Ser: 0.96 mg/dL (ref 0.50–1.35)
Creatinine, Ser: 0.9 mg/dL (ref 0.50–1.35)
GFR calc Af Amer: >90 mL/min (ref >90)
GFR calc non Af Amer: >90 mL/min (ref >90)
Glucose, Bld: 277 mg/dL — ABNORMAL HIGH (ref 70–99)
Sodium: 136 mEq/L (ref 135–145)

## 2011-12-24 LAB — GLUCOSE, CAPILLARY
Glucose-Capillary: >600 mg/dL (ref 70–99)
Glucose-Capillary: >600 mg/dL (ref 70–99)
Glucose-Capillary: 372 mg/dL — ABNORMAL HIGH (ref 70–99)

## 2011-12-24 LAB — COMPREHENSIVE METABOLIC PANEL
AST: 24 U/L (ref 0–37)
Albumin: 4.1 g/dL (ref 3.5–5.2)
Alkaline Phosphatase: 161 U/L — ABNORMAL HIGH (ref 39–117)
BUN: 19 mg/dL (ref 6–23)
CO2: 21 mEq/L (ref 19–32)
Chloride: 85 mEq/L — ABNORMAL LOW (ref 96–112)
GFR calc non Af Amer: >90 mL/min (ref >90)
Potassium: 4.9 mEq/L (ref 3.5–5.1)
Total Bilirubin: 0.8 mg/dL (ref 0.3–1.2)

## 2011-12-24 LAB — RAPID URINE DRUG SCREEN, HOSP PERFORMED
Amphetamines: NOT DETECTED
Barbiturates: NOT DETECTED
Benzodiazepines: NOT DETECTED

## 2011-12-24 LAB — CBC
HCT: 50.1 % (ref 39.0–52.0)
MCV: 94.7 fL (ref 78.0–100.0)
RBC: 5.29 MIL/uL (ref 4.22–5.81)
RDW: 13.3 % (ref 11.5–15.5)
WBC: 6.3 10*3/uL (ref 4.0–10.5)

## 2011-12-24 LAB — MAGNESIUM: Magnesium: 2.3 mg/dL (ref 1.5–2.5)

## 2011-12-24 MED ORDER — SERTRALINE HCL 50 MG PO TABS
50.0000 mg | ORAL_TABLET | Freq: Every day | ORAL | Status: DC
  Administered 2011-12-25: 50 mg via ORAL
  Filled 2011-12-24 (×2): qty 1

## 2011-12-24 MED ORDER — DEXTROSE 50 % IV SOLN: 25.0000 mL | INTRAVENOUS | Status: DC | PRN

## 2011-12-24 MED ORDER — INSULIN ASPART 100 UNIT/ML ~~LOC~~ SOLN
10.0000 [IU] | Freq: Once | SUBCUTANEOUS | Status: AC
  Administered 2011-12-24: 10 [IU] via INTRAVENOUS
  Filled 2011-12-24: qty 1

## 2011-12-24 MED ORDER — PANTOPRAZOLE SODIUM 40 MG PO TBEC
40.0000 mg | DELAYED_RELEASE_TABLET | Freq: Every day | ORAL | Status: DC
  Administered 2011-12-25 – 2011-12-26 (×2): 40 mg via ORAL
  Filled 2011-12-24 (×2): qty 1

## 2011-12-24 MED ORDER — HYDROCODONE-ACETAMINOPHEN 5-325 MG PO TABS
1.0000 | ORAL_TABLET | ORAL | Status: DC | PRN
  Administered 2011-12-24 – 2011-12-26 (×5): 1 via ORAL
  Filled 2011-12-24 (×5): qty 1

## 2011-12-24 MED ORDER — SODIUM CHLORIDE 0.9 % IV SOLN: INTRAVENOUS | Status: DC

## 2011-12-24 MED ORDER — INSULIN ASPART 100 UNIT/ML ~~LOC~~ SOLN: 10.0000 [IU] | Freq: Three times a day (TID) | SUBCUTANEOUS | Status: DC

## 2011-12-24 MED ORDER — SODIUM CHLORIDE 0.9 % IV SOLN
INTRAVENOUS | Status: DC
  Administered 2011-12-24: 10.9 [IU]/h via INTRAVENOUS
  Filled 2011-12-24: qty 1

## 2011-12-24 MED ORDER — DEXTROSE-NACL 5-0.45 % IV SOLN: INTRAVENOUS | Status: DC

## 2011-12-24 MED ORDER — SODIUM CHLORIDE 0.9 % IV BOLUS (SEPSIS): 1000.0000 mL | Freq: Once | INTRAVENOUS | Status: DC

## 2011-12-24 MED ORDER — SODIUM CHLORIDE 0.9 % IV SOLN
INTRAVENOUS | Status: DC
  Administered 2011-12-24: 18:00:00 via INTRAVENOUS

## 2011-12-24 MED ORDER — CLONAZEPAM 1 MG PO TABS: 2.0000 mg | ORAL_TABLET | Freq: Two times a day (BID) | ORAL | Status: DC | PRN

## 2011-12-24 MED ORDER — HYDROCODONE-ACETAMINOPHEN 5-325 MG PO TABS
1.0000 | ORAL_TABLET | Freq: Once | ORAL | Status: AC
  Administered 2011-12-24: 1 via ORAL
  Filled 2011-12-24: qty 1

## 2011-12-24 NOTE — ED Notes (Addendum)
Spoke with Dr. Mahala Menghini regarding elevated blood glucose greater than 600mg /dl. Verbal order for BMET now. Verbal order for one hour CBGs and maintain current insulin drip rate until blood glucose less than 600mg /dl.

## 2011-12-24 NOTE — ED Notes (Signed)
Insulin gtt rate changed to 3.1 units/hr per Glucostabilizer protocol.

## 2011-12-24 NOTE — ED Provider Notes (Signed)
History     CSN: [id number]  Arrival date & time 12/24/11  1418   First MD Initiated Contact with Patient 12/24/11 1502      Chief Complaint  Patient presents with  . Hyperglycemia  . Medical Clearance    (Consider location/radiation/quality/duration/timing/severity/associated sxs/prior treatment) HPI  Patient relates he has run out of his diabetic test strips. He relates he was seen today by his psychiatric counselor and told his counselor that he wanted to throw his insulin a way. He relates that this is his third session with his counselor. His counselor has filled out IVC papers on him. Patient relates they left the house this morning he ate rolls and juice and forgot to bring his NovoLog insulin pen with him he states he was going to go home and take his insulin and lay down however he's now in the emergency room. Patient states he has a history of depression and states he was last admitted to behavioral health at Christmas time and states "it didn't help I still felt depressed". He however states he's not suicidal.  Patient relates she's had pain in his right shoulder for a long time and is pacing his doctor about it. Reviewing his prior ER records he was complaining of it in December and had x-rays done in the ER.  PCP Dr [name] at [facility])  Past Medical History  Diagnosis Date  . Diabetes mellitus   . Hypercholesterolemia   . GERD (gastroesophageal reflux disease)   . Hypertension   . Hypercholesteremia     hx of  . Hemorrhoids     hx of  . Peripheral neuropathy   . Depression     Past Surgical History  Procedure Date  . Tonsillectomy     Family History  Problem Relation Age of Onset  . Hypotension Mother     History  Substance Use Topics  . Smoking status: Never Smoker   . Smokeless tobacco: Not on file  . Alcohol Use: No   lives with spouse    Review of Systems  All other systems reviewed and are negative.    Allergies    Review of patient's allergies indicates no known allergies.  Home Medications   Current Outpatient Rx  Name Route Sig Dispense Refill  . CALCIUM CARBONATE ANTACID 500 MG PO CHEW Oral Chew 1 tablet by mouth 3 (three) times daily as needed. As needed for indigestion.     Marland Kitchen CLONAZEPAM 2 MG PO TABS Oral Take 2 mg by mouth at bedtime    . HYDROCODONE-ACETAMINOPHEN 5-500 MG PO TABS Oral Take 1 tablet by mouth every 6 (six) hours as needed. pain    . INSULIN ASPART 100 UNIT/ML [place] SOLN Subcutaneous Inject 5- 10 Units into the skin 3 (three) times daily with meals.    . INSULIN GLARGINE 100 UNIT/ML [place] SOLN Subcutaneous Inject 50 Units into the skin at bedtime. 10 mL 0  . PANTOPRAZOLE SODIUM 40 MG PO TBEC Oral Take 40 mg by mouth daily.      . SERTRALINE HCL 50 MG PO TABS Oral Take 1 tablet (50 mg total) by mouth daily. 30 tablet 0  PT ran out of his zoloft a week ago  BP 130/68  Pulse 114  Temp 98.4 F (36.9 C)  Resp 20  SpO2 99%     Vital signs normal except for tachycardia   Physical Exam  Nursing note and vitals reviewed. Constitutional: He is oriented to person, place, and  time. He appears well-developed and well-nourished.  Non-toxic appearance. He does not appear ill. No distress.  HENT:  Head: Normocephalic and atraumatic.  Right Ear: External ear normal.  Left Ear: External ear normal.  Nose: Nose normal. No mucosal edema or rhinorrhea.  Mouth/Throat: Mucous membranes are normal. No dental abscesses or uvula swelling.       Mucous membranes dry  Eyes: Conjunctivae and EOM are normal. Pupils are equal, round, and reactive to light.  Neck: Normal range of motion and full passive range of motion without pain. Neck supple.  Cardiovascular: Normal rate, regular rhythm and normal heart sounds.  Exam reveals no gallop and no friction rub.   No murmur heard. Pulmonary/Chest: Effort normal and breath sounds normal. No respiratory distress. He has no wheezes. He has no rhonchi. He has  no rales. He exhibits no tenderness and no crepitus.  Abdominal: Soft. Normal appearance and bowel sounds are normal. He exhibits no distension. There is no tenderness. There is no rebound and no guarding.  Musculoskeletal: Normal range of motion. He exhibits no edema and no tenderness.       Moves all extremities well.   Neurological: He is alert and oriented to person, place, and time. He has normal strength. No cranial nerve deficit.  Skin: Skin is warm, dry and intact. No rash noted. No erythema. No pallor.  Psychiatric: His speech is normal and behavior is normal. His mood appears not anxious.       Affect is flat, patient became tearful during the end of his interview.    ED Course  Procedures (including critical care time)  Patient started on IV bolus of normal saline and IV drip of normal saline. He was given one bolus of NovoLog insulin 10 units IV based on CBG reading greater than 600. When his labs returned he was started on the glucose stabilizer protocol.  17:24 Dr Earleen Newport admit to step-down, [facility] team 4  Results for orders placed during the hospital encounter of 12/24/11  CBC      Component Value Range   WBC 6.3  4.0 - 10.5 (K/uL)   RBC 5.29  4.22 - 5.81 (MIL/uL)   Hemoglobin 15.7  13.0 - 17.0 (g/dL)   HCT 04.5  40.9 - 81.1 (%)   MCV 94.7  78.0 - 100.0 (fL)   MCH 29.7  26.0 - 34.0 (pg)   MCHC 31.3  30.0 - 36.0 (g/dL)   RDW 91.4  78.2 - 95.6 (%)   Platelets 262  150 - 400 (K/uL)  COMPREHENSIVE METABOLIC PANEL      Component Value Range   Sodium 122 (*) 135 - 145 (mEq/L)   Potassium 4.9  3.5 - 5.1 (mEq/L)   Chloride 85 (*) 96 - 112 (mEq/L)   CO2 21  19 - 32 (mEq/L)   Glucose, Bld 1148 (*) 70 - 99 (mg/dL)   BUN 19  6 - 23 (mg/dL)   Creatinine, Ser 2.13  0.50 - 1.35 (mg/dL)   Calcium 9.5  8.4 - 08.6 (mg/dL)   Total Protein 7.8  6.0 - 8.3 (g/dL)   Albumin 4.1  3.5 - 5.2 (g/dL)   AST 24  0 - 37 (U/L)   ALT 14  0 - 53 (U/L)   Alkaline Phosphatase 161 (*) 39 - 117  (U/L)   Total Bilirubin 0.8  0.3 - 1.2 (mg/dL)   GFR calc non Af Amer >90  >90 (mL/min)   GFR calc Af Amer >90  >90 (mL/min)  ETHANOL      Component Value Range   Alcohol, Ethyl (B) <11  0 - 11 (mg/dL)  ACETAMINOPHEN LEVEL      Component Value Range   Acetaminophen (Tylenol), Serum <15.0  10 - 30 (ug/mL)  URINE RAPID DRUG SCREEN (HOSP PERFORMED)      Component Value Range   Opiates NONE DETECTED  NONE DETECTED    Cocaine NONE DETECTED  NONE DETECTED    Benzodiazepines NONE DETECTED  NONE DETECTED    Amphetamines NONE DETECTED  NONE DETECTED    Tetrahydrocannabinol NONE DETECTED  NONE DETECTED    Barbiturates NONE DETECTED  NONE DETECTED   GLUCOSE, CAPILLARY      Component Value Range   Glucose-Capillary >600 (*) 70 - 99 (mg/dL)  GLUCOSE, CAPILLARY      Component Value Range   Glucose-Capillary >600 (*) 70 - 99 (mg/dL)   Laboratory interpretation all normal except for hyperglycemia without acidosis, hyponatremia   Diagnoses that have been ruled out:  None  Diagnoses that are still under consideration:  None  Final diagnoses:  Hyperglycemia  Depression  Suicidal ideation   Plan admission for hyperglycemia, then psychiatric evaluation.   Devoria Albe, MD, FACEP   CRITICAL CARE Performed by: Ward Givens   Total critical care time: 31 minutes Critical care time was exclusive of separately billable procedures and treating other patients.  Critical care was necessary to treat or prevent imminent or life-threatening deterioration.  Critical care was time spent personally by me on the following activities: development of treatment plan with patient and/or surrogate as well as nursing, discussions with consultants, evaluation of patient's response to treatment, examination of patient, obtaining history from patient or surrogate, ordering and performing treatments and interventions, ordering and review of laboratory studies, ordering and review of radiographic studies, pulse  oximetry and re-evaluation of patient's condition.   MDM          Ward Givens, MD 12/25/11 1118

## 2011-12-24 NOTE — ED Notes (Signed)
Sent from [facility] with IVC papers and drowsiness. histroy of many OD attempts and SI. Pt states he is tired of the pain and wants to sleep for many days. CBG read critical high.

## 2011-12-24 NOTE — H&P (Signed)
PCP-Eva Shaw  CC-elevated blood sugar  HPI- 56 yo AAM, presents from [place] (counsellor Lauree Chandler) with pssible ideation to kill himself  By not taking insulin.  Doesn't remember the whole conversation, but felt like "trhowing the insuln away"  He admits to taking his meds properly.  Has insomnia and lots of worries, and has not fillied Clonopin that he usually take sto help him relax and sleep.  Also his R shoulder has a rotator tear and cannot sleep.  No SI/HI.  Has been depreessed lke this for a while.  Has family and a suppprt system.  Has attempted suicide in the past, and was at [facility] over [place]- felt better after going there. Does take Zoloft for depression, and was just switched from Effexor and Abilify.    Mild sob, no cp, shouloder ache, no blurred vision, + polyuria, +polydipsia, no polyphagia,  No cough cold fever, no dark stool or tarry stools.  Was sick recently at [facility] visions or voices heard.  He presented to the emergency room with blood sugars over 600 and subsequently was referred over for admission as he also had significant hyponatremia with a sodium of 122.  he was placed on the glucometer sliding scale after being given 10 units of insulin.  Active Ambulatory Problems    Diagnosis Date Noted  . Hypercholesterolemia   . Altered mental status 11/18/2011  . Overdose of benzodiazepine 11/18/2011  . Diabetes mellitus 11/18/2011  . Dysthymia 11/26/2011   Resolved Ambulatory Problems    Diagnosis Date Noted  . No Resolved Ambulatory Problems   Past Medical History  Diagnosis Date  . Diabetes mellitus   . GERD (gastroesophageal reflux disease)   . Hypertension   . Hypercholesteremia   . Hemorrhoids   . Peripheral neuropathy   . Depression      Past Surgical History  Procedure Date  . Tonsillectomy    No Known Allergies  Family History  Problem Relation Age of Onset  . Hypotension Mother    Father died of cancer motherhad a large GI bleed  History    Social History  . Marital Status: Married    Spouse Name: N/A    Number of Children: N/A  . Years of Education: N/A   Occupational History  . unemployed     applying for disability   Social History Main Topics  . Smoking status: Never Smoker   . Smokeless tobacco: Not on file  . Alcohol Use: No  . Drug Use: No  . Sexually Active: Yes    Birth Control/ Protection: None   Other Topics Concern  . Not on file   Social History Narrative  . No narrative on file   Results for orders placed during the hospital encounter of 12/24/11 (from the past 24 hour(s))  GLUCOSE, CAPILLARY     Status: Abnormal   Collection Time   12/24/11  2:53 PM      Component Value Range   Glucose-Capillary >600 (*) 70 - 99 (mg/dL)  CBC     Status: Normal   Collection Time   12/24/11  2:55 PM      Component Value Range   WBC 6.3  4.0 - 10.5 (K/uL)   RBC 5.29  4.22 - 5.81 (MIL/uL)   Hemoglobin 15.7  13.0 - 17.0 (g/dL)   HCT 16.1  09.6 - 04.5 (%)   MCV 94.7  78.0 - 100.0 (fL)   MCH 29.7  26.0 - 34.0 (pg)   MCHC 31.3  30.0 - 36.0 (g/dL)   RDW 16.1  09.6 - 04.5 (%)   Platelets 262  150 - 400 (K/uL)  COMPREHENSIVE METABOLIC PANEL     Status: Abnormal   Collection Time   12/24/11  2:55 PM      Component Value Range   Sodium 122 (*) 135 - 145 (mEq/L)   Potassium 4.9  3.5 - 5.1 (mEq/L)   Chloride 85 (*) 96 - 112 (mEq/L)   CO2 21  19 - 32 (mEq/L)   Glucose, Bld 1148 (*) 70 - 99 (mg/dL)   BUN 19  6 - 23 (mg/dL)   Creatinine, Ser 4.09  0.50 - 1.35 (mg/dL)   Calcium 9.5  8.4 - 81.1 (mg/dL)   Total Protein 7.8  6.0 - 8.3 (g/dL)   Albumin 4.1  3.5 - 5.2 (g/dL)   AST 24  0 - 37 (U/L)   ALT 14  0 - 53 (U/L)   Alkaline Phosphatase 161 (*) 39 - 117 (U/L)   Total Bilirubin 0.8  0.3 - 1.2 (mg/dL)   GFR calc non Af Amer >90  >90 (mL/min)   GFR calc Af Amer >90  >90 (mL/min)  ETHANOL     Status: Normal   Collection Time   12/24/11  2:55 PM      Component Value Range   Alcohol, Ethyl (B) <11  0 - 11  (mg/dL)  ACETAMINOPHEN LEVEL     Status: Normal   Collection Time   12/24/11  2:55 PM      Component Value Range   Acetaminophen (Tylenol), Serum <15.0  10 - 30 (ug/mL)  URINE RAPID DRUG SCREEN (HOSP PERFORMED)     Status: Normal   Collection Time   12/24/11  3:18 PM      Component Value Range   Opiates NONE DETECTED  NONE DETECTED    Cocaine NONE DETECTED  NONE DETECTED    Benzodiazepines NONE DETECTED  NONE DETECTED    Amphetamines NONE DETECTED  NONE DETECTED    Tetrahydrocannabinol NONE DETECTED  NONE DETECTED    Barbiturates NONE DETECTED  NONE DETECTED   GLUCOSE, CAPILLARY     Status: Abnormal   Collection Time   12/24/11  5:24 PM      Component Value Range   Glucose-Capillary >600 (*) 70 - 99 (mg/dL)   No results found.   #1 hyperglycemic nonketotic state-patient will be given IV fluids at 50 cc an hour he will get blood sugars every 2 hourly initially and BP meds also every 4 hourly. When his blood sugar does drop below 250 cc per hour nursing is instructed to contact them to change IV fluids to D5. His initial EKG did not show any hyperkalemia his changes and it is likely that this is just a lack of fluid his body.  He should be transition back to Lantus 44 units as his regular dosage on presumption was care by psychiatry who should be consulted subsequent to him stabilizing.  #2 hyponatremia this is likely dilutional effect of his sodium because of increased osmolality of his blood secondary to hypoglycemia his corrected sodium would be between 136 and 140.  #3 patient will require intensive psychiatric followup as an outpatient he was recently switched over from his Abilify and his Effexor to Zoloft and he may need augmentation of the same please consult psychiatry once he is more stable.

## 2011-12-24 NOTE — Progress Notes (Signed)
EKg done at bedside showed slightly elevated T waves with sinus tachycardia and no other specific findings

## 2011-12-25 DIAGNOSIS — K219 Gastro-esophageal reflux disease without esophagitis: Secondary | ICD-10-CM | POA: Diagnosis present

## 2011-12-25 DIAGNOSIS — F32A Depression, unspecified: Secondary | ICD-10-CM | POA: Diagnosis present

## 2011-12-25 DIAGNOSIS — M75101 Unspecified rotator cuff tear or rupture of right shoulder, not specified as traumatic: Secondary | ICD-10-CM | POA: Diagnosis present

## 2011-12-25 DIAGNOSIS — E871 Hypo-osmolality and hyponatremia: Secondary | ICD-10-CM

## 2011-12-25 DIAGNOSIS — F329 Major depressive disorder, single episode, unspecified: Secondary | ICD-10-CM

## 2011-12-25 DIAGNOSIS — E876 Hypokalemia: Secondary | ICD-10-CM | POA: Diagnosis present

## 2011-12-25 DIAGNOSIS — E1100 Type 2 diabetes mellitus with hyperosmolarity without nonketotic hyperglycemic-hyperosmolar coma (NKHHC): Secondary | ICD-10-CM | POA: Diagnosis present

## 2011-12-25 DIAGNOSIS — R4182 Altered mental status, unspecified: Secondary | ICD-10-CM

## 2011-12-25 LAB — CBC
HCT: 40.5 % (ref 39.0–52.0)
MCV: 85.4 fL (ref 78.0–100.0)
RDW: 12.9 % (ref 11.5–15.5)
WBC: 6.1 10*3/uL (ref 4.0–10.5)

## 2011-12-25 LAB — BASIC METABOLIC PANEL
BUN: 17 mg/dL (ref 6–23)
CO2: 22 mEq/L (ref 19–32)
Calcium: 9.2 mg/dL (ref 8.4–10.5)
Calcium: 9.1 mg/dL (ref 8.4–10.5)
Calcium: 9 mg/dL (ref 8.4–10.5)
Calcium: 8.8 mg/dL (ref 8.4–10.5)
Chloride: 106 mEq/L (ref 96–112)
Creatinine, Ser: 0.85 mg/dL (ref 0.50–1.35)
Creatinine, Ser: 0.8 mg/dL (ref 0.50–1.35)
GFR calc Af Amer: >90 mL/min (ref >90)
GFR calc Af Amer: >90 mL/min (ref >90)
GFR calc Af Amer: >90 mL/min (ref >90)
GFR calc Af Amer: >90 mL/min (ref >90)
GFR calc non Af Amer: >90 mL/min (ref >90)
GFR calc non Af Amer: >90 mL/min (ref >90)
GFR calc non Af Amer: >90 mL/min (ref >90)
GFR calc non Af Amer: >90 mL/min (ref >90)
Potassium: 3.4 mEq/L — ABNORMAL LOW (ref 3.5–5.1)
Potassium: 3.4 mEq/L — ABNORMAL LOW (ref 3.5–5.1)
Sodium: 140 mEq/L (ref 135–145)
Sodium: 140 mEq/L (ref 135–145)

## 2011-12-25 LAB — GLUCOSE, CAPILLARY
Glucose-Capillary: 101 mg/dL — ABNORMAL HIGH (ref 70–99)
Glucose-Capillary: 158 mg/dL — ABNORMAL HIGH (ref 70–99)
Glucose-Capillary: 313 mg/dL — ABNORMAL HIGH (ref 70–99)
Glucose-Capillary: 94 mg/dL (ref 70–99)
Glucose-Capillary: 185 mg/dL — ABNORMAL HIGH (ref 70–99)
Glucose-Capillary: 157 mg/dL — ABNORMAL HIGH (ref 70–99)

## 2011-12-25 MED ORDER — INSULIN GLARGINE 100 UNIT/ML ~~LOC~~ SOLN
44.0000 [IU] | Freq: Every day | SUBCUTANEOUS | Status: DC
  Administered 2011-12-25: 44 [IU] via SUBCUTANEOUS
  Filled 2011-12-25: qty 3

## 2011-12-25 MED ORDER — DEXTROSE-NACL 5-0.45 % IV SOLN
INTRAVENOUS | Status: DC
  Administered 2011-12-25: 03:00:00 via INTRAVENOUS

## 2011-12-25 MED ORDER — INSULIN ASPART 100 UNIT/ML ~~LOC~~ SOLN
6.0000 [IU] | Freq: Three times a day (TID) | SUBCUTANEOUS | Status: DC
  Administered 2011-12-26: 6 [IU] via SUBCUTANEOUS

## 2011-12-25 MED ORDER — POTASSIUM CHLORIDE CRYS ER 20 MEQ PO TBCR
20.0000 meq | EXTENDED_RELEASE_TABLET | Freq: Once | ORAL | Status: AC
  Administered 2011-12-25: 20 meq via ORAL
  Filled 2011-12-25: qty 1

## 2011-12-25 MED ORDER — INSULIN ASPART 100 UNIT/ML ~~LOC~~ SOLN
0.0000 [IU] | SUBCUTANEOUS | Status: DC
  Administered 2011-12-25 – 2011-12-26 (×2): 3 [IU] via SUBCUTANEOUS
  Filled 2011-12-25: qty 3

## 2011-12-25 MED ORDER — ZOLPIDEM TARTRATE 10 MG PO TABS: 10.0000 mg | ORAL_TABLET | Freq: Every evening | ORAL | Status: DC | PRN

## 2011-12-25 MED ORDER — SERTRALINE HCL 100 MG PO TABS
100.0000 mg | ORAL_TABLET | Freq: Every day | ORAL | Status: DC
  Administered 2011-12-26: 100 mg via ORAL
  Filled 2011-12-25: qty 1

## 2011-12-25 MED ORDER — INSULIN GLARGINE 100 UNIT/ML ~~LOC~~ SOLN
44.0000 [IU] | Freq: Every day | SUBCUTANEOUS | Status: DC
  Administered 2011-12-25: 44 [IU] via SUBCUTANEOUS

## 2011-12-25 MED ORDER — PRAMIPEXOLE DIHYDROCHLORIDE 1 MG PO TABS
1.0000 mg | ORAL_TABLET | Freq: Every day | ORAL | Status: DC
  Administered 2011-12-25: 1 mg via ORAL
  Filled 2011-12-25 (×2): qty 1

## 2011-12-25 NOTE — Progress Notes (Signed)
CSW spoke with pt's spouse to obtain collateral information.  Pt's spouse reports he was in [facility] twice before (over christmas and once about a year ago).  Both times it was due to high blood sugars and she describes he becomes severely depressed and has poor judgement.  She states other than when his sugars become elevated he has good judgment, quiet and low key, with only some mild depressive symptoms.  He does see a Veterinary surgeon outpatient at [facility].  CSW will continue to follow for results of psychiatric consult.

## 2011-12-25 NOTE — Progress Notes (Signed)
NURSING  After reviewing chart from transfer, called social work Fleet Contras) to question if IVC papers were still valid if hole punched and stapled (questionable tampering a legal document?) and was told that the paperwork is still valid and holds validity.   Horatio Pel 12/25/2011

## 2011-12-25 NOTE — Consult Note (Signed)
Reason for Consult: Suicidal ideation by withholding insulin Referring Physician: Dr. Charna Elizabeth [name] is an 56 y.o. male.  HPI: Patient was admitted for hyperglycemia  after meeting with his counselor at [facility].  AXIS I Major depression; mental status change with hyperglycemia AXIS ii Deferred AXIS III  Restless Leg Syndrom  Past Medical History  Diagnosis Date  . Diabetes mellitus   . Hypercholesterolemia   . GERD (gastroesophageal reflux disease)   . Hypertension   . Hypercholesteremia     hx of  . Hemorrhoids     hx of  . Peripheral neuropathy   . Depression   AXIS iV medication management, financial, relocation AXIS V  GAF   50  Past Surgical History  Procedure Date  . Tonsillectomy     Family History  Problem Relation Age of Onset  . Hypotension Mother     Social History:  reports that he has never smoked. He does not have any smokeless tobacco history on file. He reports that he does not drink alcohol or use illicit drugs.  Allergies: No Known Allergies  Medications: I have reviewed the patient's current medications.  Results for orders placed during the hospital encounter of 12/24/11 (from the past 48 hour(s))  GLUCOSE, CAPILLARY     Status: Abnormal   Collection Time   12/24/11  2:53 PM      Component Value Range Comment   Glucose-Capillary >600 (*) 70 - 99 (mg/dL)   CBC     Status: Normal   Collection Time   12/24/11  2:55 PM      Component Value Range Comment   WBC 6.3  4.0 - 10.5 (K/uL)    RBC 5.29  4.22 - 5.81 (MIL/uL)    Hemoglobin 15.7  13.0 - 17.0 (g/dL)    HCT 81.1  91.4 - 78.2 (%)    MCV 94.7  78.0 - 100.0 (fL)    MCH 29.7  26.0 - 34.0 (pg)    MCHC 31.3  30.0 - 36.0 (g/dL)    RDW 95.6  21.3 - 08.6 (%)    Platelets 262  150 - 400 (K/uL)   COMPREHENSIVE METABOLIC PANEL     Status: Abnormal   Collection Time   12/24/11  2:55 PM      Component Value Range Comment   Sodium 122 (*) 135 - 145 (mEq/L)    Potassium 4.9  3.5 - 5.1 (mEq/L)    Chloride 85 (*) 96 - 112 (mEq/L)    CO2 21  19 - 32 (mEq/L)    Glucose, Bld 1148 (*) 70 - 99 (mg/dL)    BUN 19  6 - 23 (mg/dL)    Creatinine, Ser 5.78  0.50 - 1.35 (mg/dL)    Calcium 9.5  8.4 - 10.5 (mg/dL)    Total Protein 7.8  6.0 - 8.3 (g/dL)    Albumin 4.1  3.5 - 5.2 (g/dL)    AST 24  0 - 37 (U/L) HEMOLYSIS AT THIS LEVEL MAY AFFECT RESULT   ALT 14  0 - 53 (U/L)    Alkaline Phosphatase 161 (*) 39 - 117 (U/L)    Total Bilirubin 0.8  0.3 - 1.2 (mg/dL)    GFR calc non Af Amer >90  >90 (mL/min)    GFR calc Af Amer >90  >90 (mL/min)   ETHANOL     Status: Normal   Collection Time   12/24/11  2:55 PM      Component Value Range Comment  Alcohol, Ethyl (B) <11  0 - 11 (mg/dL)   ACETAMINOPHEN LEVEL     Status: Normal   Collection Time   12/24/11  2:55 PM      Component Value Range Comment   Acetaminophen (Tylenol), Serum <15.0  10 - 30 (ug/mL)   URINE RAPID DRUG SCREEN (HOSP PERFORMED)     Status: Normal   Collection Time   12/24/11  3:18 PM      Component Value Range Comment   Opiates NONE DETECTED  NONE DETECTED     Cocaine NONE DETECTED  NONE DETECTED     Benzodiazepines NONE DETECTED  NONE DETECTED     Amphetamines NONE DETECTED  NONE DETECTED     Tetrahydrocannabinol NONE DETECTED  NONE DETECTED     Barbiturates NONE DETECTED  NONE DETECTED    GLUCOSE, CAPILLARY     Status: Abnormal   Collection Time   12/24/11  5:24 PM      Component Value Range Comment   Glucose-Capillary >600 (*) 70 - 99 (mg/dL)   GLUCOSE, CAPILLARY     Status: Abnormal   Collection Time   12/24/11  7:27 PM      Component Value Range Comment   Glucose-Capillary >600 (*) 70 - 99 (mg/dL)   BASIC METABOLIC PANEL     Status: Abnormal   Collection Time   12/24/11  8:25 PM      Component Value Range Comment   Sodium 132 (*) 135 - 145 (mEq/L)    Potassium 3.9  3.5 - 5.1 (mEq/L)    Chloride 97  96 - 112 (mEq/L)    CO2 19  19 - 32 (mEq/L)    Glucose, Bld 537 (*) 70 - 99 (mg/dL)    BUN 21  6 - 23 (mg/dL)     Creatinine, Ser 1.61  0.50 - 1.35 (mg/dL)    Calcium 9.5  8.4 - 10.5 (mg/dL)    GFR calc non Af Amer >90  >90 (mL/min)    GFR calc Af Amer >90  >90 (mL/min)   MAGNESIUM     Status: Normal   Collection Time   12/24/11  8:36 PM      Component Value Range Comment   Magnesium 2.3  1.5 - 2.5 (mg/dL)   PHOSPHORUS     Status: Normal   Collection Time   12/24/11  8:36 PM      Component Value Range Comment   Phosphorus 2.5  2.3 - 4.6 (mg/dL)   GLUCOSE, CAPILLARY     Status: Abnormal   Collection Time   12/24/11  9:14 PM      Component Value Range Comment   Glucose-Capillary 372 (*) 70 - 99 (mg/dL)   MRSA PCR SCREENING     Status: Normal   Collection Time   12/24/11 10:10 PM      Component Value Range Comment   MRSA by PCR NEGATIVE  NEGATIVE    GLUCOSE, CAPILLARY     Status: Abnormal   Collection Time   12/24/11 10:21 PM      Component Value Range Comment   Glucose-Capillary 313 (*) 70 - 99 (mg/dL)   BASIC METABOLIC PANEL     Status: Abnormal   Collection Time   12/24/11 10:45 PM      Component Value Range Comment   Sodium 136  135 - 145 (mEq/L)    Potassium 3.8  3.5 - 5.1 (mEq/L)    Chloride 102  96 - 112 (mEq/L)  CO2 21  19 - 32 (mEq/L)    Glucose, Bld 277 (*) 70 - 99 (mg/dL)    BUN 19  6 - 23 (mg/dL)    Creatinine, Ser 1.61  0.50 - 1.35 (mg/dL)    Calcium 9.7  8.4 - 10.5 (mg/dL)    GFR calc non Af Amer >90  >90 (mL/min)    GFR calc Af Amer >90  >90 (mL/min)   GLUCOSE, CAPILLARY     Status: Abnormal   Collection Time   12/24/11 11:19 PM      Component Value Range Comment   Glucose-Capillary 227 (*) 70 - 99 (mg/dL)   GLUCOSE, CAPILLARY     Status: Abnormal   Collection Time   12/25/11 12:24 AM      Component Value Range Comment   Glucose-Capillary 158 (*) 70 - 99 (mg/dL)   BASIC METABOLIC PANEL     Status: Abnormal   Collection Time   12/25/11 12:32 AM      Component Value Range Comment   Sodium 139  135 - 145 (mEq/L)    Potassium 3.4 (*) 3.5 - 5.1 (mEq/L)    Chloride 105   96 - 112 (mEq/L)    CO2 24  19 - 32 (mEq/L)    Glucose, Bld 153 (*) 70 - 99 (mg/dL)    BUN 17  6 - 23 (mg/dL)    Creatinine, Ser 0.96  0.50 - 1.35 (mg/dL)    Calcium 9.2  8.4 - 10.5 (mg/dL)    GFR calc non Af Amer >90  >90 (mL/min)    GFR calc Af Amer >90  >90 (mL/min)   GLUCOSE, CAPILLARY     Status: Abnormal   Collection Time   12/25/11  1:27 AM      Component Value Range Comment   Glucose-Capillary 101 (*) 70 - 99 (mg/dL)   BASIC METABOLIC PANEL     Status: Abnormal   Collection Time   12/25/11  2:17 AM      Component Value Range Comment   Sodium 139  135 - 145 (mEq/L)    Potassium 3.3 (*) 3.5 - 5.1 (mEq/L)    Chloride 106  96 - 112 (mEq/L)    CO2 24  19 - 32 (mEq/L)    Glucose, Bld 104 (*) 70 - 99 (mg/dL)    BUN 16  6 - 23 (mg/dL)    Creatinine, Ser 0.45  0.50 - 1.35 (mg/dL)    Calcium 9.1  8.4 - 10.5 (mg/dL)    GFR calc non Af Amer >90  >90 (mL/min)    GFR calc Af Amer >90  >90 (mL/min)   GLUCOSE, CAPILLARY     Status: Abnormal   Collection Time   12/25/11  2:20 AM      Component Value Range Comment   Glucose-Capillary 109 (*) 70 - 99 (mg/dL)   GLUCOSE, CAPILLARY     Status: Abnormal   Collection Time   12/25/11  3:27 AM      Component Value Range Comment   Glucose-Capillary 105 (*) 70 - 99 (mg/dL)   BASIC METABOLIC PANEL     Status: Abnormal   Collection Time   12/25/11  4:20 AM      Component Value Range Comment   Sodium 140  135 - 145 (mEq/L)    Potassium 3.4 (*) 3.5 - 5.1 (mEq/L)    Chloride 108  96 - 112 (mEq/L)    CO2 22  19 - 32 (mEq/L)  Glucose, Bld 101 (*) 70 - 99 (mg/dL)    BUN 15  6 - 23 (mg/dL)    Creatinine, Ser 5.28  0.50 - 1.35 (mg/dL)    Calcium 9.0  8.4 - 10.5 (mg/dL)    GFR calc non Af Amer >90  >90 (mL/min)    GFR calc Af Amer >90  >90 (mL/min)   CBC     Status: Normal   Collection Time   12/25/11  4:20 AM      Component Value Range Comment   WBC 6.1  4.0 - 10.5 (K/uL)    RBC 4.74  4.22 - 5.81 (MIL/uL)    Hemoglobin 14.2  13.0 - 17.0 (g/dL)     HCT 41.3  24.4 - 01.0 (%)    MCV 85.4  78.0 - 100.0 (fL)    MCH 30.0  26.0 - 34.0 (pg)    MCHC 35.1  30.0 - 36.0 (g/dL)    RDW 27.2  53.6 - 64.4 (%)    Platelets 244  150 - 400 (K/uL)   GLUCOSE, CAPILLARY     Status: Normal   Collection Time   12/25/11  4:24 AM      Component Value Range Comment   Glucose-Capillary 94  70 - 99 (mg/dL)   BASIC METABOLIC PANEL     Status: Abnormal   Collection Time   12/25/11  5:44 AM      Component Value Range Comment   Sodium 140  135 - 145 (mEq/L)    Potassium 3.4 (*) 3.5 - 5.1 (mEq/L)    Chloride 106  96 - 112 (mEq/L)    CO2 24  19 - 32 (mEq/L)    Glucose, Bld 129 (*) 70 - 99 (mg/dL)    BUN 15  6 - 23 (mg/dL)    Creatinine, Ser 0.34  0.50 - 1.35 (mg/dL)    Calcium 8.8  8.4 - 10.5 (mg/dL)    GFR calc non Af Amer >90  >90 (mL/min)    GFR calc Af Amer >90  >90 (mL/min)   GLUCOSE, CAPILLARY     Status: Abnormal   Collection Time   12/25/11  7:40 AM      Component Value Range Comment   Glucose-Capillary 188 (*) 70 - 99 (mg/dL)    Comment 1 Notify RN      Comment 2 Documented in Chart     GLUCOSE, CAPILLARY     Status: Abnormal   Collection Time   12/25/11 11:34 AM      Component Value Range Comment   Glucose-Capillary 185 (*) 70 - 99 (mg/dL)    Comment 1 Notify RN      Comment 2 Documented in Chart     GLUCOSE, CAPILLARY     Status: Abnormal   Collection Time   12/25/11  4:19 PM      Component Value Range Comment   Glucose-Capillary 157 (*) 70 - 99 (mg/dL)    Comment 1 Notify RN       No results found.  Review of Systems  Unable to perform ROS: other   Blood pressure 118/80, pulse 97, temperature 98.5 F (36.9 C), temperature source Oral, resp. rate 16, height 5\' 9"  (1.753 m), weight 88.5 kg (195 lb 1.7 oz), SpO2 95.00%. Physical Exam  Assessment/Plan: Chart is reviewed. Discussed with Psych CSW and RN Pt is sitting up, awake and alert, and oriented to person place situation and date. He has a normal rate of speech that is  monotonous.  His affect is blunted. When asked about his admission, he states that he was misunderstood and that he was not trying to stop using insulin to kill himself. He states that he was leaving his home and in the morning after having breakfast, rushing to take his wife to her appointment and continuing on to meet his counselor at [facility]. He realized after he left that he did not have the proper insulin/syringe to use after breakfast. He didn't have time to return to his home and while meeting with his counselor he became concerned that this patient was becoming confused. He denies suicidal intent. The counselorI had him call his wife and tell her to return home while out the counselor took him over to the crisis unit. From there he was admitted to the hospital. It is understood there is an IVC due to the belief that he had withheld insulin. He states that it was misconstrued. He explained that his brother also has diabetes and uses  exercise to control his diabetes. This patient claims that he has also tried to use exercise. 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 He states that he has plenty of insulin with provisions in his home and he carries a pack in his car. He states that just incidentally he didn't have the syringe for the postprandial insulin he was supposed to take. He continues to state with some conviction that he will also tried to control diabetes with exercise. He has been subjected to major changes in lifestyle and geographic location. 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 Represents significant stress but he states that he is in the process of applying for disability and medical benefits. He states that he often has 'jerky legs' at night.  His wife has had to lave the bed because  he kicks her so much  The psychiatric CSW is appreciated for collaborative information. The wife states that when his sugar is well controlled with insulin he is very coherent and calm. It is only when his blood glucose is out of control that he becomes more confused and likely to become more depressed. This response is congruent with the lack of blood glucose which is the primary component of cognitive function. Dr. Betti Cruz is contacted and medications are discussed with recommendations. RECOMMENDATIONS:  1. Recommend:  Sitter and IVC is no longer needed if blood gluc is under control 2. Consider increase in Zoloft to improve mood 3. Consider Pramipexole 1mg  HS for restless legs 4. Continue benzodiazepine for anxiety prn 5. Consider ambien 10 mg 15 min before sleep 6. Continue outpatient support; psychiatric management at [facility] 7. Consider Diabetic patient education regarding the general principles and for PCP to explain indications for diet/exercise vs insulin/medication  -  or both as his particular regimen. 8. This patient has the capacity to make decisions about his medications and life activities  9. No further psychiatric needs are identified.  Psychiatry MD signing off.  [name] [name] 12/25/2011, 4:47 PM

## 2011-12-25 NOTE — Progress Notes (Signed)
Subjective: Currently denies any suicidal ideation to me. Complained of chronic right shoulder pain.  Objective: Vital signs in last 24 hours: Filed Vitals:   12/25/11 0400 12/25/11 0426 12/25/11 0720 12/25/11 0800  BP:  110/72 120/71   Pulse:  90 97   Temp: 97.6 F (36.4 C)   98 F (36.7 C)  TempSrc: Oral   Oral  Resp:  16 20   Height:      Weight:      SpO2:  99% 100%    Weight change:   Intake/Output Summary (Last 24 hours) at 12/25/11 5409 Last data filed at 12/25/11 0700  Gross per 24 hour  Intake   1125 ml  Output   1025 ml  Net    100 ml    Physical Exam: General: Awake, Oriented, No acute distress. HEENT: EOMI. Neck: Supple CV: S1 and S2 Lungs: Clear to ascultation bilaterally Abdomen: Soft, Nontender, Nondistended, +bowel sounds. Ext: Good pulses. Trace edema, Right shoulder ROM limited, patient has lifting his hand above the shoulder.  Lab Results:  Basename 12/25/11 0544 12/25/11 0420 12/24/11 2036  NA 140 140 --  K 3.4* 3.4* --  CL 106 108 --  CO2 24 22 --  GLUCOSE 129* 101* --  BUN 15 15 --  CREATININE 0.82 0.80 --  CALCIUM 8.8 9.0 --  MG -- -- 2.3  PHOS -- -- 2.5    Basename 12/24/11 1455  AST 24  ALT 14  ALKPHOS 161*  BILITOT 0.8  PROT 7.8  ALBUMIN 4.1   No results found for this basename: LIPASE:2,AMYLASE:2 in the last 72 hours  Basename 12/25/11 0420 12/24/11 1455  WBC 6.1 6.3  NEUTROABS -- --  HGB 14.2 15.7  HCT 40.5 50.1  MCV 85.4 94.7  PLT 244 262   No results found for this basename: CKTOTAL:3,CKMB:3,CKMBINDEX:3,TROPONINI:3 in the last 72 hours No components found with this basename: POCBNP:3 No results found for this basename: DDIMER:2 in the last 72 hours No results found for this basename: HGBA1C:2 in the last 72 hours No results found for this basename: CHOL:2,HDL:2,LDLCALC:2,TRIG:2,CHOLHDL:2,LDLDIRECT:2 in the last 72 hours No results found for this basename: TSH,T4TOTAL,FREET3,T3FREE,THYROIDAB in the last 72  hours No results found for this basename: VITAMINB12:2,FOLATE:2,FERRITIN:2,TIBC:2,IRON:2,RETICCTPCT:2 in the last 72 hours  Micro Results: Recent Results (from the past 240 hour(s))  MRSA PCR SCREENING     Status: Normal   Collection Time   12/24/11 10:10 PM      Component Value Range Status Comment   MRSA by PCR NEGATIVE  NEGATIVE  Final     Studies/Results: No results found.  Medications: I have reviewed the patient's current medications. Scheduled Meds:   . HYDROcodone-acetaminophen  1 tablet Oral Once  . insulin aspart  0-15 Units Subcutaneous Q4H  . insulin aspart  10 Units Intravenous Once  . insulin aspart  6 Units Subcutaneous TID WC  . insulin glargine  44 Units Subcutaneous QHS  . pantoprazole  40 mg Oral Q1200  . potassium chloride  20 mEq Oral Once  . sertraline  50 mg Oral Daily  . DISCONTD: sodium chloride   Intravenous STAT  . DISCONTD: sodium chloride   Intravenous STAT  . DISCONTD: insulin aspart  10 Units Subcutaneous TID WC  . DISCONTD: insulin glargine  44 Units Subcutaneous Daily  . DISCONTD: sodium chloride  1,000 mL Intravenous Once   Continuous Infusions:   . DISCONTD: sodium chloride 100 mL/hr at 12/24/11 1803  . DISCONTD: sodium chloride    .  DISCONTD: dextrose 5 % and 0.45% NaCl    . DISCONTD: dextrose 5 % and 0.45% NaCl Stopped (12/25/11 0806)  . DISCONTD: insulin (NOVOLIN-R) infusion 10.9 Units/hr (12/24/11 1830)   PRN Meds:.clonazePAM, dextrose, HYDROcodone-acetaminophen  Assessment/Plan: 1. Uncontrolled type 2 diabetes mellitus with hyperosmolar nonketotic hyperglycemia, Resolved.  Insulin drip discontinued.  Patient back on home regimen of NovoLog and Lantus.  NovoLog 6 units 3 times a day with meals.  On Lantus 44 units each bedtime.  Reports that his Lantus was increased to 50 units but currently will leave him at 44 units.  2.  Hyponatremia.  Likely due to hyperosmolar state from uncontrolled diabetes.  Resolved.  3.  Hypokalemia.   Replace as needed.  4.  Possible suicidal ideation/history of depression.  Requested psychiatry evaluation.  Uncertain if the patient was committed.  Further management as per psychiatry.  Continue the patient on home depression regimen.  5.  Chronic right shoulder pain from rotator cuff.  Stable at this time.  6.  GERD.  Continue PPI.  7.  Prophylaxis.  SCDs.  8.  CODE STATUS.  Full code.  9.  Disposition.  Patient medically stable for discharge.  Pending psychiatric evaluation.  Transfer the patient to regular bed.   LOS: 1 day  Shandon Matson A, MD 12/25/2011, 8:12 AM

## 2011-12-26 LAB — BASIC METABOLIC PANEL
BUN: 10 mg/dL (ref 6–23)
CO2: 25 mEq/L (ref 19–32)
Chloride: 105 mEq/L (ref 96–112)
Creatinine, Ser: 0.76 mg/dL (ref 0.50–1.35)

## 2011-12-26 LAB — CBC
HCT: 39.3 % (ref 39.0–52.0)
MCHC: 34.6 g/dL (ref 30.0–36.0)
MCV: 85.8 fL (ref 78.0–100.0)
RDW: 13 % (ref 11.5–15.5)

## 2011-12-26 LAB — GLUCOSE, CAPILLARY: Glucose-Capillary: 135 mg/dL — ABNORMAL HIGH (ref 70–99)

## 2011-12-26 MED ORDER — SERTRALINE HCL 50 MG PO TABS: 100.0000 mg | ORAL_TABLET | Freq: Every day | ORAL | Status: DC

## 2011-12-26 MED ORDER — ZOLPIDEM TARTRATE 10 MG PO TABS: 10.0000 mg | ORAL_TABLET | Freq: Every evening | ORAL | Status: DC | PRN

## 2011-12-26 MED ORDER — HYDROCODONE-ACETAMINOPHEN 5-500 MG PO TABS: 1.0000 | ORAL_TABLET | Freq: Four times a day (QID) | ORAL | Status: DC | PRN

## 2011-12-26 MED ORDER — PRAMIPEXOLE DIHYDROCHLORIDE 1 MG PO TABS: 1.0000 mg | ORAL_TABLET | Freq: Every day | ORAL | Status: DC

## 2011-12-26 MED ORDER — PANTOPRAZOLE SODIUM 40 MG PO TBEC: 40.0000 mg | DELAYED_RELEASE_TABLET | Freq: Every day | ORAL | Status: DC

## 2011-12-26 NOTE — Progress Notes (Signed)
Diabetic education regarding proper diet , and monitor blood sugar given to pt per nurse, allowed to watch diabetic teaching videos [id number], pt verbalized understanding after each video

## 2011-12-26 NOTE — Discharge Summary (Signed)
Discharge Summary  [name] [name] MR#: [id number]  DOB:Dec 21, 1955  Date of Admission: 12/24/2011 Date of Discharge: 12/26/2011  Patient's PCP: [name] Sorenson, MD, MD, [facility]  Attending Physician:[name] [name] A  Consults: Dr. Ferol [name]  Discharge Diagnoses: Principal Problem:  *Uncontrolled type 2 diabetes mellitus with hyperosmolar nonketotic hyperglycemia Active Problems:  Hypercholesterolemia  DM (diabetes mellitus), type 2, uncontrolled with complications  Hypokalemia  Hyponatremia  Depression  GERD (gastroesophageal reflux disease)  Right rotator cuff tear  Brief Admitting History and Physical [name] [name] is a 56 -year-old [place] American male who presented on 12/24/2011 with possible ideation to kill himself by not taking insulin.  Patient was found to be hyperglycemic and was admitted to the hospitalist service for further care and management.  Discharge Medications Medication List  As of 12/26/2011  9:45 AM   TAKE these medications         calcium carbonate 500 MG chewable tablet   Commonly known as: TUMS - dosed in mg elemental calcium   Chew 1 tablet by mouth 3 (three) times daily as needed. As needed for indigestion.      clonazePAM 2 MG tablet   Commonly known as: KLONOPIN   Take 2 mg by mouth 2 (two) times daily as needed. anxiety      HYDROcodone-acetaminophen 5-500 MG per tablet   Commonly known as: VICODIN   Take 1 tablet by mouth every 6 (six) hours as needed for pain. pain      insulin aspart 100 UNIT/ML injection   Commonly known as: novoLOG   Inject 10 Units into the skin 3 (three) times daily with meals.      insulin glargine 100 UNIT/ML injection   Commonly known as: LANTUS   Inject 44 Units into the skin at bedtime.      pantoprazole 40 MG tablet   Commonly known as: PROTONIX   Take 1 tablet (40 mg total) by mouth daily.      pramipexole 1 MG tablet   Commonly known as: MIRAPEX   Take 1 tablet (1 mg total) by mouth at  bedtime.      sertraline 50 MG tablet   Commonly known as: ZOLOFT   Take 2 tablets (100 mg total) by mouth daily.      zolpidem 10 MG tablet   Commonly known as: AMBIEN   Take 1 tablet (10 mg total) by mouth at bedtime as needed for sleep.            Hospital Course: 1. Uncontrolled type 2 diabetes mellitus with hyperosmolar nonketotic hyperglycemia, Resolved.  Initially the patient was started on insulin drip to control his blood sugars.  As his blood sugars improved he was placed back on home insulin regimen. Nursing staff initially during the course of hospital stay had reported to me that the patient had reported to them that by not eating he could control his diabetes and therefore not need insulin. However patient denied that he ever mentioned this to the nursing staff and was taken out of context. He does indicate that he is motivated to start eating healthy and exercising. He also indicated to me that he would not stop taking his insulin at this time.   2. Hyponatremia. Likely due to hyperosmolar state from uncontrolled diabetes. Resolved.   3. Hypokalemia. Replace as needed.   4. Possible suicidal ideation/history of depression.  Patient was evaluated by Dr. Ferol [name], and after discussion it was determined that the patient had capacity to make  decisions for medications and life activities.  Psychiatry also did not see the need for a sitter or involuntary commitment. Patient had his Zoloft dose increased as per psychiatry. Pramipexole 1 mg po qhs started for restless leg. Patient started on Ambien for sleep.   5. Chronic right shoulder pain from rotator cuff. Stable at this time. Instructed the [facility] and request outpatient orthopedic appointment for consideration of surgical evaluation.  Patient was given prescription of hydrocodone/APAP 20 tablets total.  6. GERD. Continue PPI.  Day of Discharge BP 109/63  Pulse 79  Temp(Src) 97.9 F (36.6 C)  (Oral)  Resp 18  Ht 5\' 9"  (1.753 m)  Wt 88.5 kg (195 lb 1.7 oz)  BMI 28.81 kg/m2  SpO2 97%  Results for orders placed during the hospital encounter of 12/24/11 (from the past 48 hour(s))  GLUCOSE, CAPILLARY     Status: Abnormal   Collection Time   12/24/11  2:53 PM      Component Value Range Comment   Glucose-Capillary >600 (*) 70 - 99 (mg/dL)   CBC     Status: Normal   Collection Time   12/24/11  2:55 PM      Component Value Range Comment   WBC 6.3  4.0 - 10.5 (K/uL)    RBC 5.29  4.22 - 5.81 (MIL/uL)    Hemoglobin 15.7  13.0 - 17.0 (g/dL)    HCT 16.1  09.6 - 04.5 (%)    MCV 94.7  78.0 - 100.0 (fL)    MCH 29.7  26.0 - 34.0 (pg)    MCHC 31.3  30.0 - 36.0 (g/dL)    RDW 40.9  81.1 - 91.4 (%)    Platelets 262  150 - 400 (K/uL)   COMPREHENSIVE METABOLIC PANEL     Status: Abnormal   Collection Time   12/24/11  2:55 PM      Component Value Range Comment   Sodium 122 (*) 135 - 145 (mEq/L)    Potassium 4.9  3.5 - 5.1 (mEq/L)    Chloride 85 (*) 96 - 112 (mEq/L)    CO2 21  19 - 32 (mEq/L)    Glucose, Bld 1148 (*) 70 - 99 (mg/dL)    BUN 19  6 - 23 (mg/dL)    Creatinine, Ser 7.82  0.50 - 1.35 (mg/dL)    Calcium 9.5  8.4 - 10.5 (mg/dL)    Total Protein 7.8  6.0 - 8.3 (g/dL)    Albumin 4.1  3.5 - 5.2 (g/dL)    AST 24  0 - 37 (U/L) HEMOLYSIS AT THIS LEVEL MAY AFFECT RESULT   ALT 14  0 - 53 (U/L)    Alkaline Phosphatase 161 (*) 39 - 117 (U/L)    Total Bilirubin 0.8  0.3 - 1.2 (mg/dL)    GFR calc non Af Amer >90  >90 (mL/min)    GFR calc Af Amer >90  >90 (mL/min)   ETHANOL     Status: Normal   Collection Time   12/24/11  2:55 PM      Component Value Range Comment   Alcohol, Ethyl (B) <11  0 - 11 (mg/dL)   ACETAMINOPHEN LEVEL     Status: Normal   Collection Time   12/24/11  2:55 PM      Component Value Range Comment   Acetaminophen (Tylenol), Serum <15.0  10 - 30 (ug/mL)   URINE RAPID DRUG SCREEN (HOSP PERFORMED)     Status: Normal  Collection Time   12/24/11  3:18 PM       Component Value Range Comment   Opiates NONE DETECTED  NONE DETECTED     Cocaine NONE DETECTED  NONE DETECTED     Benzodiazepines NONE DETECTED  NONE DETECTED     Amphetamines NONE DETECTED  NONE DETECTED     Tetrahydrocannabinol NONE DETECTED  NONE DETECTED     Barbiturates NONE DETECTED  NONE DETECTED    GLUCOSE, CAPILLARY     Status: Abnormal   Collection Time   12/24/11  5:24 PM      Component Value Range Comment   Glucose-Capillary >600 (*) 70 - 99 (mg/dL)   GLUCOSE, CAPILLARY     Status: Abnormal   Collection Time   12/24/11  7:27 PM      Component Value Range Comment   Glucose-Capillary >600 (*) 70 - 99 (mg/dL)   BASIC METABOLIC PANEL     Status: Abnormal   Collection Time   12/24/11  8:25 PM      Component Value Range Comment   Sodium 132 (*) 135 - 145 (mEq/L)    Potassium 3.9  3.5 - 5.1 (mEq/L)    Chloride 97  96 - 112 (mEq/L)    CO2 19  19 - 32 (mEq/L)    Glucose, Bld 537 (*) 70 - 99 (mg/dL)    BUN 21  6 - 23 (mg/dL)    Creatinine, Ser 7.82  0.50 - 1.35 (mg/dL)    Calcium 9.5  8.4 - 10.5 (mg/dL)    GFR calc non Af Amer >90  >90 (mL/min)    GFR calc Af Amer >90  >90 (mL/min)   MAGNESIUM     Status: Normal   Collection Time   12/24/11  8:36 PM      Component Value Range Comment   Magnesium 2.3  1.5 - 2.5 (mg/dL)   PHOSPHORUS     Status: Normal   Collection Time   12/24/11  8:36 PM      Component Value Range Comment   Phosphorus 2.5  2.3 - 4.6 (mg/dL)   GLUCOSE, CAPILLARY     Status: Abnormal   Collection Time   12/24/11  9:14 PM      Component Value Range Comment   Glucose-Capillary 372 (*) 70 - 99 (mg/dL)   MRSA PCR SCREENING     Status: Normal   Collection Time   12/24/11 10:10 PM      Component Value Range Comment   MRSA by PCR NEGATIVE  NEGATIVE    GLUCOSE, CAPILLARY     Status: Abnormal   Collection Time   12/24/11 10:21 PM      Component Value Range Comment   Glucose-Capillary 313 (*) 70 - 99 (mg/dL)   BASIC METABOLIC PANEL     Status: Abnormal    Collection Time   12/24/11 10:45 PM      Component Value Range Comment   Sodium 136  135 - 145 (mEq/L)    Potassium 3.8  3.5 - 5.1 (mEq/L)    Chloride 102  96 - 112 (mEq/L)    CO2 21  19 - 32 (mEq/L)    Glucose, Bld 277 (*) 70 - 99 (mg/dL)    BUN 19  6 - 23 (mg/dL)    Creatinine, Ser 9.56  0.50 - 1.35 (mg/dL)    Calcium 9.7  8.4 - 10.5 (mg/dL)    GFR calc non Af Amer >90  >90 (mL/min)  GFR calc Af Amer >90  >90 (mL/min)   GLUCOSE, CAPILLARY     Status: Abnormal   Collection Time   12/24/11 11:19 PM      Component Value Range Comment   Glucose-Capillary 227 (*) 70 - 99 (mg/dL)   GLUCOSE, CAPILLARY     Status: Abnormal   Collection Time   12/25/11 12:24 AM      Component Value Range Comment   Glucose-Capillary 158 (*) 70 - 99 (mg/dL)   BASIC METABOLIC PANEL     Status: Abnormal   Collection Time   12/25/11 12:32 AM      Component Value Range Comment   Sodium 139  135 - 145 (mEq/L)    Potassium 3.4 (*) 3.5 - 5.1 (mEq/L)    Chloride 105  96 - 112 (mEq/L)    CO2 24  19 - 32 (mEq/L)    Glucose, Bld 153 (*) 70 - 99 (mg/dL)    BUN 17  6 - 23 (mg/dL)    Creatinine, Ser 1.61  0.50 - 1.35 (mg/dL)    Calcium 9.2  8.4 - 10.5 (mg/dL)    GFR calc non Af Amer >90  >90 (mL/min)    GFR calc Af Amer >90  >90 (mL/min)   GLUCOSE, CAPILLARY     Status: Abnormal   Collection Time   12/25/11  1:27 AM      Component Value Range Comment   Glucose-Capillary 101 (*) 70 - 99 (mg/dL)   BASIC METABOLIC PANEL     Status: Abnormal   Collection Time   12/25/11  2:17 AM      Component Value Range Comment   Sodium 139  135 - 145 (mEq/L)    Potassium 3.3 (*) 3.5 - 5.1 (mEq/L)    Chloride 106  96 - 112 (mEq/L)    CO2 24  19 - 32 (mEq/L)    Glucose, Bld 104 (*) 70 - 99 (mg/dL)    BUN 16  6 - 23 (mg/dL)    Creatinine, Ser 0.96  0.50 - 1.35 (mg/dL)    Calcium 9.1  8.4 - 10.5 (mg/dL)    GFR calc non Af Amer >90  >90 (mL/min)    GFR calc Af Amer >90  >90 (mL/min)   GLUCOSE, CAPILLARY     Status: Abnormal    Collection Time   12/25/11  2:20 AM      Component Value Range Comment   Glucose-Capillary 109 (*) 70 - 99 (mg/dL)   GLUCOSE, CAPILLARY     Status: Abnormal   Collection Time   12/25/11  3:27 AM      Component Value Range Comment   Glucose-Capillary 105 (*) 70 - 99 (mg/dL)   BASIC METABOLIC PANEL     Status: Abnormal   Collection Time   12/25/11  4:20 AM      Component Value Range Comment   Sodium 140  135 - 145 (mEq/L)    Potassium 3.4 (*) 3.5 - 5.1 (mEq/L)    Chloride 108  96 - 112 (mEq/L)    CO2 22  19 - 32 (mEq/L)    Glucose, Bld 101 (*) 70 - 99 (mg/dL)    BUN 15  6 - 23 (mg/dL)    Creatinine, Ser 0.45  0.50 - 1.35 (mg/dL)    Calcium 9.0  8.4 - 10.5 (mg/dL)    GFR calc non Af Amer >90  >90 (mL/min)    GFR calc Af Amer >90  >90 (mL/min)  CBC     Status: Normal   Collection Time   12/25/11  4:20 AM      Component Value Range Comment   WBC 6.1  4.0 - 10.5 (K/uL)    RBC 4.74  4.22 - 5.81 (MIL/uL)    Hemoglobin 14.2  13.0 - 17.0 (g/dL)    HCT 40.1  02.7 - 25.3 (%)    MCV 85.4  78.0 - 100.0 (fL)    MCH 30.0  26.0 - 34.0 (pg)    MCHC 35.1  30.0 - 36.0 (g/dL)    RDW 66.4  40.3 - 47.4 (%)    Platelets 244  150 - 400 (K/uL)   GLUCOSE, CAPILLARY     Status: Normal   Collection Time   12/25/11  4:24 AM      Component Value Range Comment   Glucose-Capillary 94  70 - 99 (mg/dL)   BASIC METABOLIC PANEL     Status: Abnormal   Collection Time   12/25/11  5:44 AM      Component Value Range Comment   Sodium 140  135 - 145 (mEq/L)    Potassium 3.4 (*) 3.5 - 5.1 (mEq/L)    Chloride 106  96 - 112 (mEq/L)    CO2 24  19 - 32 (mEq/L)    Glucose, Bld 129 (*) 70 - 99 (mg/dL)    BUN 15  6 - 23 (mg/dL)    Creatinine, Ser 2.59  0.50 - 1.35 (mg/dL)    Calcium 8.8  8.4 - 10.5 (mg/dL)    GFR calc non Af Amer >90  >90 (mL/min)    GFR calc Af Amer >90  >90 (mL/min)   GLUCOSE, CAPILLARY     Status: Abnormal   Collection Time   12/25/11  7:40 AM      Component Value Range Comment    Glucose-Capillary 188 (*) 70 - 99 (mg/dL)    Comment 1 Notify RN      Comment 2 Documented in Chart     GLUCOSE, CAPILLARY     Status: Abnormal   Collection Time   12/25/11 11:34 AM      Component Value Range Comment   Glucose-Capillary 185 (*) 70 - 99 (mg/dL)    Comment 1 Notify RN      Comment 2 Documented in Chart     GLUCOSE, CAPILLARY     Status: Abnormal   Collection Time   12/25/11  4:19 PM      Component Value Range Comment   Glucose-Capillary 157 (*) 70 - 99 (mg/dL)    Comment 1 Notify RN     GLUCOSE, CAPILLARY     Status: Abnormal   Collection Time   12/25/11  8:07 PM      Component Value Range Comment   Glucose-Capillary 107 (*) 70 - 99 (mg/dL)   GLUCOSE, CAPILLARY     Status: Abnormal   Collection Time   12/25/11 11:59 PM      Component Value Range Comment   Glucose-Capillary 166 (*) 70 - 99 (mg/dL)    Comment 1 Documented in Chart      Comment 2 Notify RN     GLUCOSE, CAPILLARY     Status: Normal   Collection Time   12/26/11  4:48 AM      Component Value Range Comment   Glucose-Capillary 94  70 - 99 (mg/dL)    Comment 1 Documented in Chart      Comment 2 Notify RN  BASIC METABOLIC PANEL     Status: Abnormal   Collection Time   12/26/11  6:10 AM      Component Value Range Comment   Sodium 138  135 - 145 (mEq/L)    Potassium 3.3 (*) 3.5 - 5.1 (mEq/L)    Chloride 105  96 - 112 (mEq/L)    CO2 25  19 - 32 (mEq/L)    Glucose, Bld 96  70 - 99 (mg/dL)    BUN 10  6 - 23 (mg/dL)    Creatinine, Ser 1.91  0.50 - 1.35 (mg/dL)    Calcium 8.7  8.4 - 10.5 (mg/dL)    GFR calc non Af Amer >90  >90 (mL/min)    GFR calc Af Amer >90  >90 (mL/min)   CBC     Status: Normal   Collection Time   12/26/11  6:10 AM      Component Value Range Comment   WBC 5.5  4.0 - 10.5 (K/uL)    RBC 4.58  4.22 - 5.81 (MIL/uL)    Hemoglobin 13.6  13.0 - 17.0 (g/dL)    HCT 47.8  29.5 - 62.1 (%)    MCV 85.8  78.0 - 100.0 (fL)    MCH 29.7  26.0 - 34.0 (pg)    MCHC 34.6  30.0 - 36.0 (g/dL)     RDW 30.8  65.7 - 84.6 (%)    Platelets 234  150 - 400 (K/uL)   GLUCOSE, CAPILLARY     Status: Normal   Collection Time   12/26/11  7:45 AM      Component Value Range Comment   Glucose-Capillary 90  70 - 99 (mg/dL)    Comment 1 Notify RN       No results found.   Disposition: Home with outpatient diabetic education.  Diet: Diabetic diet  Activity: Resume as tolerated   Follow-up Appts: Discharge Orders    Future Orders Please Complete By Expires   Diet Carb Modified      Increase activity slowly      Discharge instructions      Comments:   [facility].  Please contact [facility] early next week to discuss a possible referral to orthopedic service for chronic right shoulder pain. Continue outpatient support; psychiatric management at [facility].  Please followup with outpatient diabetic appointment for education.      TESTS THAT NEED FOLLOW-UP None  Time spent on discharge, talking to the patient, and coordinating care: 35 mins.   Signed: Cristal Ford, MD 12/26/2011, 9:45 AM

## 2011-12-26 NOTE — Progress Notes (Signed)
Subjective: Patient complaining of right shoulder pain.  Denies any suicidal ideation or thought to me.  Blood sugars better controlled.  Objective: Vital signs in last 24 hours: Filed Vitals:   12/25/11 1217 12/25/11 1405 12/25/11 2142 12/26/11 0545  BP: 115/71 118/80 112/69 109/63  Pulse: 95 97 78 79  Temp:  98.5 F (36.9 C) 98 F (36.7 C) 97.9 F (36.6 C)  TempSrc:  Oral Oral Oral  Resp: 16 16 18 18   Height:      Weight:      SpO2: 100% 95% 98% 97%   Weight change:  No intake or output data in the 24 hours ending 12/26/11 0934  Physical Exam: General: Awake, Oriented, No acute distress. HEENT: EOMI. Neck: Supple CV: S1 and S2 Lungs: Clear to ascultation bilaterally Abdomen: Soft, Nontender, Nondistended, +bowel sounds. Ext: Good pulses. Trace edema.  Lab Results:  Basename 12/26/11 0610 12/25/11 0544 12/24/11 2036  NA 138 140 --  K 3.3* 3.4* --  CL 105 106 --  CO2 25 24 --  GLUCOSE 96 129* --  BUN 10 15 --  CREATININE 0.76 0.82 --  CALCIUM 8.7 8.8 --  MG -- -- 2.3  PHOS -- -- 2.5    Basename 12/24/11 1455  AST 24  ALT 14  ALKPHOS 161*  BILITOT 0.8  PROT 7.8  ALBUMIN 4.1   No results found for this basename: LIPASE:2,AMYLASE:2 in the last 72 hours  Basename 12/26/11 0610 12/25/11 0420  WBC 5.5 6.1  NEUTROABS -- --  HGB 13.6 14.2  HCT 39.3 40.5  MCV 85.8 85.4  PLT 234 244   No results found for this basename: CKTOTAL:3,CKMB:3,CKMBINDEX:3,TROPONINI:3 in the last 72 hours No components found with this basename: POCBNP:3 No results found for this basename: DDIMER:2 in the last 72 hours No results found for this basename: HGBA1C:2 in the last 72 hours No results found for this basename: CHOL:2,HDL:2,LDLCALC:2,TRIG:2,CHOLHDL:2,LDLDIRECT:2 in the last 72 hours No results found for this basename: TSH,T4TOTAL,FREET3,T3FREE,THYROIDAB in the last 72 hours No results found for this basename: VITAMINB12:2,FOLATE:2,FERRITIN:2,TIBC:2,IRON:2,RETICCTPCT:2 in the  last 72 hours  Micro Results: Recent Results (from the past 240 hour(s))  MRSA PCR SCREENING     Status: Normal   Collection Time   12/24/11 10:10 PM      Component Value Range Status Comment   MRSA by PCR NEGATIVE  NEGATIVE  Final     Studies/Results: No results found.  Medications: I have reviewed the patient's current medications. Scheduled Meds:    . insulin aspart  0-15 Units Subcutaneous Q4H  . insulin aspart  6 Units Subcutaneous TID WC  . insulin glargine  44 Units Subcutaneous QHS  . pantoprazole  40 mg Oral Q1200  . pramipexole  1 mg Oral QHS  . sertraline  100 mg Oral Daily  . DISCONTD: sertraline  50 mg Oral Daily   Continuous Infusions:  PRN Meds:.clonazePAM, dextrose, HYDROcodone-acetaminophen, zolpidem  Assessment/Plan: 1. Uncontrolled type 2 diabetes mellitus with hyperosmolar nonketotic hyperglycemia, Resolved.  Patient currently back on home insulin regimen.  Nursing staff had reported to me that the patient had reported to them that by not eating he could control his diabetes and therefore not need insulin. However patient denied that he ever mentioned this to the nursing staff and was taken out of context.  He does indicate that he is motivated to start eating healthy and exercising.  He also indicated to me that he would not stop taking his insulin at this time.  2.  Hyponatremia.  Likely due to hyperosmolar state from uncontrolled diabetes.  Resolved.  3.  Hypokalemia.  Replace as needed.  4.  Possible suicidal ideation/history of depression.  Patient had his Zoloft dose increased as per psychiatry.  Pramipexole 1 mg po qhs started for restless leg.  Patient started on Ambien for sleep.  5.  Chronic right shoulder pain from rotator cuff.  Stable at this time.  Instructed the [facility] and [facility] outpatient orthopedic appointment for consideration of surgical evaluation.  6.  GERD.  Continue PPI.  7.  Prophylaxis.  SCDs.  8.   CODE STATUS.  Full code.  9.  Disposition.  Discharge the patient today.   LOS: 2 days  Zariana Strub A, MD 12/26/2011, 9:34 AM

## 2011-12-26 NOTE — Progress Notes (Signed)
Cm spoke with pt concerning d/c planning. Per pt does not possess glucometer. MD, Betti Cruz, filled out rx for free glucometer, rx given to pt by CM. Cm provided pt with outpt DM referral. Pt informed that Cm will fax info to facility, pt must contact [facility] Monday for appt. Pt has orange card, rx and strips provided by insurance. Pt agrees with plan.   Roxy Manns Guillermina Shaft,RN,BSN [phone]

## 2011-12-26 NOTE — Progress Notes (Signed)
Pt d/c home as order per md, iv d/c without difficulty,  Pt verbalized understanding of discharge instructions, received all prescriptions, and copy of discharge instructions, transferred to vehicle via wheelchair with tech. No complaints, no s/s of distress.

## 2011-12-26 NOTE — Progress Notes (Signed)
Spoke with patient regarding his blood sugars at home.  HE said he did not have a meter or strips at home.  He states he does not have insurance but does have an orange card and this will cover strips but he still needs a meter.  I contacted T. Earlene Plater, CM who stated he could get a meter at [facility] for $10.  I told the patient this and encouraged him to purchase this so he can keep up with his blood sugars at home.

## 2012-01-07 ENCOUNTER — Ambulatory Visit: Payer: Self-pay | Admitting: *Deleted

## 2012-01-09 ENCOUNTER — Inpatient Hospital Stay (HOSPITAL_COMMUNITY)
Admission: EM | Admit: 2012-01-09 | Discharge: 2012-01-13 | DRG: 638 | Disposition: A | Discharge status: Home / Self Care | Payer: Medicaid Other | Attending: Internal Medicine | Admitting: Internal Medicine

## 2012-01-09 ENCOUNTER — Emergency Department (HOSPITAL_COMMUNITY)
Admission: EM | Admit: 2012-01-09 | Discharge: 2012-01-09 | Discharge status: Against Medical Advice | Payer: Medicaid Other | Attending: Emergency Medicine | Admitting: Emergency Medicine

## 2012-01-09 ENCOUNTER — Encounter (HOSPITAL_COMMUNITY): Payer: Self-pay

## 2012-01-09 DIAGNOSIS — M75101 Unspecified rotator cuff tear or rupture of right shoulder, not specified as traumatic: Secondary | ICD-10-CM

## 2012-01-09 DIAGNOSIS — T424X1A Poisoning by benzodiazepines, accidental (unintentional), initial encounter: Secondary | ICD-10-CM

## 2012-01-09 DIAGNOSIS — E871 Hypo-osmolality and hyponatremia: Secondary | ICD-10-CM

## 2012-01-09 DIAGNOSIS — F341 Dysthymic disorder: Secondary | ICD-10-CM

## 2012-01-09 DIAGNOSIS — F32A Depression, unspecified: Secondary | ICD-10-CM

## 2012-01-09 DIAGNOSIS — I10 Essential (primary) hypertension: Secondary | ICD-10-CM | POA: Insufficient documentation

## 2012-01-09 DIAGNOSIS — E1100 Type 2 diabetes mellitus with hyperosmolarity without nonketotic hyperglycemic-hyperosmolar coma (NKHHC): Secondary | ICD-10-CM

## 2012-01-09 DIAGNOSIS — Z794 Long term (current) use of insulin: Secondary | ICD-10-CM | POA: Insufficient documentation

## 2012-01-09 DIAGNOSIS — F329 Major depressive disorder, single episode, unspecified: Secondary | ICD-10-CM | POA: Diagnosis present

## 2012-01-09 DIAGNOSIS — R5381 Other malaise: Secondary | ICD-10-CM | POA: Insufficient documentation

## 2012-01-09 DIAGNOSIS — F3130 Bipolar disorder, current episode depressed, mild or moderate severity, unspecified: Secondary | ICD-10-CM | POA: Diagnosis present

## 2012-01-09 DIAGNOSIS — R739 Hyperglycemia, unspecified: Secondary | ICD-10-CM

## 2012-01-09 DIAGNOSIS — E876 Hypokalemia: Secondary | ICD-10-CM

## 2012-01-09 DIAGNOSIS — K219 Gastro-esophageal reflux disease without esophagitis: Secondary | ICD-10-CM | POA: Insufficient documentation

## 2012-01-09 DIAGNOSIS — G9340 Encephalopathy, unspecified: Secondary | ICD-10-CM

## 2012-01-09 DIAGNOSIS — E1165 Type 2 diabetes mellitus with hyperglycemia: Secondary | ICD-10-CM

## 2012-01-09 DIAGNOSIS — E7800 Pure hypercholesterolemia, unspecified: Secondary | ICD-10-CM

## 2012-01-09 DIAGNOSIS — E1110 Type 2 diabetes mellitus with ketoacidosis without coma: Secondary | ICD-10-CM

## 2012-01-09 DIAGNOSIS — IMO0002 Reserved for concepts with insufficient information to code with codable children: Secondary | ICD-10-CM

## 2012-01-09 DIAGNOSIS — E1310 Other specified diabetes mellitus with ketoacidosis without coma: Principal | ICD-10-CM | POA: Diagnosis present

## 2012-01-09 DIAGNOSIS — E860 Dehydration: Secondary | ICD-10-CM | POA: Diagnosis present

## 2012-01-09 DIAGNOSIS — E1169 Type 2 diabetes mellitus with other specified complication: Secondary | ICD-10-CM | POA: Insufficient documentation

## 2012-01-09 DIAGNOSIS — R4182 Altered mental status, unspecified: Secondary | ICD-10-CM

## 2012-01-09 DIAGNOSIS — E875 Hyperkalemia: Secondary | ICD-10-CM

## 2012-01-09 MED ORDER — TETANUS-DIPHTH-ACELL PERTUSSIS 5-2.5-18.5 LF-MCG/0.5 IM SUSP
0.5000 mL | Freq: Once | INTRAMUSCULAR | Status: AC
  Administered 2012-01-10: 0.5 mL via INTRAMUSCULAR
  Filled 2012-01-09: qty 0.5

## 2012-01-09 MED ORDER — SODIUM CHLORIDE 0.9 % IV BOLUS (SEPSIS)
1000.0000 mL | Freq: Once | INTRAVENOUS | Status: AC
  Administered 2012-01-09: 1000 mL via INTRAVENOUS

## 2012-01-09 NOTE — ED Provider Notes (Signed)
History     CSN: [id number]  Arrival date & time 01/09/12  2100   First MD Initiated Contact with Patient 01/09/12 2127      Chief Complaint  Patient presents with  . Hyperglycemia     generalized weakness dyphoresis    (Consider location/radiation/quality/duration/timing/severity/associated sxs/prior treatment) HPI Comments: The patient arrives via EMS stating he called them because he didn't feel well. He denied fever, N, V. He informs me on entering the room that he does not want to stay, that he wants to be discharged home. I discussed condition and complaints with patient alone in the room, where he was cooperative, calm and provided answers willingly to all questions. He reports his blood sugar was elevated and that he had taken his insulin prior to EMS arrival at his house. He has been a diabetic for the past 25 years and understands the implications and risks of high blood sugar. He maintains that he wants to be discharged home. He is alert and oriented - he does not appear intoxicated. His vitals are essentially stable.   The history is provided by the patient.    Past Medical History  Diagnosis Date  . Diabetes mellitus   . Hypercholesterolemia   . GERD (gastroesophageal reflux disease)   . Hypertension   . Hypercholesteremia     hx of  . Hemorrhoids     hx of  . Peripheral neuropathy   . Depression     Past Surgical History  Procedure Date  . Tonsillectomy     Family History  Problem Relation Age of Onset  . Hypotension Mother     History  Substance Use Topics  . Smoking status: Never Smoker   . Smokeless tobacco: Not on file  . Alcohol Use: No      Review of Systems  Constitutional: Positive for fatigue. Negative for fever and chills.       He states he does not feel well.  HENT: Negative.   Respiratory: Negative.   Cardiovascular: Negative.   Gastrointestinal: Negative.  Negative for nausea, vomiting and abdominal pain.  Genitourinary: Positive  for frequency. Negative for dysuria.  Musculoskeletal: Negative.   Skin: Negative.   Neurological: Negative.  Negative for speech difficulty, light-headedness and headaches.    Allergies  Review of patient's allergies indicates no known allergies.  Home Medications   Current Outpatient Rx  Name Route Sig Dispense Refill  . CALCIUM CARBONATE ANTACID 500 MG PO CHEW Oral Chew 1 tablet by mouth 3 (three) times daily as needed. As needed for indigestion.    Marland Kitchen CLONAZEPAM 2 MG PO TABS Oral Take 2 mg by mouth 2 (two) times daily as needed. For anxiety    . HYDROCODONE-ACETAMINOPHEN 5-500 MG PO TABS Oral Take 1 tablet by mouth every 6 (six) hours as needed. For pain    . INSULIN ASPART 100 UNIT/ML [place] SOLN Subcutaneous Inject 10 Units into the skin 3 (three) times daily with meals.    . INSULIN GLARGINE 100 UNIT/ML [place] SOLN Subcutaneous Inject 44 Units into the skin at bedtime.    Marland Kitchen PANTOPRAZOLE SODIUM 40 MG PO TBEC Oral Take 40 mg by mouth daily.    Marland Kitchen PRAMIPEXOLE DIHYDROCHLORIDE 1 MG PO TABS Oral Take 1 mg by mouth at bedtime.    . SERTRALINE HCL 50 MG PO TABS Oral Take 100 mg by mouth daily.    Marland Kitchen ZOLPIDEM TARTRATE 10 MG PO TABS Oral Take 10 mg by mouth at bedtime as needed. For  sleep.      BP 125/71  Pulse 110  Temp(Src) 98.7 F (37.1 C) (Oral)  Resp 20  SpO2 100%  Physical Exam  Constitutional: He is oriented to person, place, and time. He appears well-developed and well-nourished. No distress.  HENT:  Head: Normocephalic and atraumatic.  Eyes: Conjunctivae are normal.  Neck: Normal range of motion. Neck supple.  Pulmonary/Chest: Effort normal.  Musculoskeletal: Normal range of motion.  Neurological: He is alert and oriented to person, place, and time. No cranial nerve deficit.  Skin: Skin is warm and dry. No rash noted.  Psychiatric: He has a normal mood and affect.    ED Course  Procedures (including critical care time)   Labs Reviewed  COMPREHENSIVE METABOLIC PANEL  URINE  RAPID DRUG SCREEN (HOSP PERFORMED)   No results found.   1. Malaise   2. Hyperglycemia       MDM  AMA discharge papers prepared but patient left department prior to receiving them. He was informed to return any time, if he changed his mind about receiving treatment.  Medical screening examination/treatment/procedure(s) were performed by non-physician practitioner and as supervising physician I was immediately available for consultation/collaboration. Pt left AMA despite advice to contrary by Ms. Narvaez. Osvaldo Human, M.D.       Rodena Medin, PA-C 01/09/12 2146  Carleene Cooper III, MD 01/10/12 [phone]

## 2012-01-09 NOTE — ED Notes (Signed)
In to assess patient. Patient uncooperative with staff and will not allow nurse to examine. Pt appears to be under the influence of a substance and cannot hold eyes open. Pt has told two different stories of why ems brought him here and cannot identify date and time without looking at cel phone. Pt instructed that staff is trying to help but pt will not allow monitors to be placed. Pt also has a reported wound to left wrist that was possibly self inflicted but patient will not allow nurse to examine. Pt does have hx mental illness. Pt also has a moderate time maintaining balance. PA S Narvaez to bedside to speak with patient.

## 2012-01-09 NOTE — ED Notes (Signed)
Pt left against medical advice after speaking with PA. I expressed my concern to PA.  Pt refused a wheelchair to depart and refused to sign ama documentation. IV d/c and 2 x 2 applied without active bleeding to iv site to rt hand placed by ems. Pt walked to front lobby with standby assistance from tech.

## 2012-01-09 NOTE — ED Notes (Signed)
[place], reports to EMS high CBG greater than 600 per finger stick, noted weakness, and diaphoresis. Pt intially lethargicb and klonipin nopted in coat pocket. Ems noted possible self inflicted wound L wrist pr denies SI/HI

## 2012-01-09 NOTE — ED Notes (Signed)
Lab at bedside for blood draw.

## 2012-01-09 NOTE — ED Notes (Signed)
Pt was seen approx 45 min ago in PDA6 and talked with provider and  refused care departed and returned for same complaint of hyperglycemia and generalized weakness. Pt has klonopin pill in pocket and refused to turn over to staff. Prior EMS that intially brought pt ED suspected self injurious behavior d/t possible self inflicted laceration to wrist. Pt denies si/hi at that time. Pt has now returned seeking care

## 2012-01-10 ENCOUNTER — Encounter (HOSPITAL_COMMUNITY): Payer: Self-pay | Admitting: Internal Medicine

## 2012-01-10 ENCOUNTER — Emergency Department (HOSPITAL_COMMUNITY): Payer: Medicaid Other

## 2012-01-10 ENCOUNTER — Other Ambulatory Visit: Payer: Self-pay

## 2012-01-10 DIAGNOSIS — E1110 Type 2 diabetes mellitus with ketoacidosis without coma: Secondary | ICD-10-CM | POA: Diagnosis present

## 2012-01-10 DIAGNOSIS — E875 Hyperkalemia: Secondary | ICD-10-CM | POA: Diagnosis present

## 2012-01-10 LAB — POCT I-STAT 3, ART BLOOD GAS (G3+)
Acid-base deficit: 15 mmol/L — ABNORMAL HIGH (ref 0.0–2.0)
Bicarbonate: 11.6 mEq/L — ABNORMAL LOW (ref 20.0–24.0)
O2 Saturation: 94 %
pO2, Arterial: 84 mmHg (ref 80.0–100.0)

## 2012-01-10 LAB — BASIC METABOLIC PANEL
BUN: 18 mg/dL (ref 6–23)
BUN: 17 mg/dL (ref 6–23)
BUN: 16 mg/dL (ref 6–23)
CO2: 23 mEq/L (ref 19–32)
CO2: 21 mEq/L (ref 19–32)
Calcium: 9.2 mg/dL (ref 8.4–10.5)
Chloride: 111 mEq/L (ref 96–112)
Chloride: 113 mEq/L — ABNORMAL HIGH (ref 96–112)
Chloride: 110 mEq/L (ref 96–112)
Creatinine, Ser: 1.01 mg/dL (ref 0.50–1.35)
Creatinine, Ser: 1.03 mg/dL (ref 0.50–1.35)
Creatinine, Ser: 0.96 mg/dL (ref 0.50–1.35)
GFR calc Af Amer: >90 mL/min (ref >90)
GFR calc non Af Amer: >90 mL/min (ref >90)
Glucose, Bld: 260 mg/dL — ABNORMAL HIGH (ref 70–99)
Glucose, Bld: 186 mg/dL — ABNORMAL HIGH (ref 70–99)
Glucose, Bld: 138 mg/dL — ABNORMAL HIGH (ref 70–99)
Glucose, Bld: 208 mg/dL — ABNORMAL HIGH (ref 70–99)
Potassium: 4.8 mEq/L (ref 3.5–5.1)
Potassium: 4.2 mEq/L (ref 3.5–5.1)
Sodium: 141 mEq/L (ref 135–145)

## 2012-01-10 LAB — COMPREHENSIVE METABOLIC PANEL
AST: 17 U/L (ref 0–37)
Albumin: 4.2 g/dL (ref 3.5–5.2)
Chloride: 84 mEq/L — ABNORMAL LOW (ref 96–112)
GFR calc Af Amer: 65 mL/min — ABNORMAL LOW (ref >90)
GFR calc non Af Amer: 56 mL/min — ABNORMAL LOW (ref >90)
Potassium: 6.7 mEq/L (ref 3.5–5.1)
Sodium: 124 mEq/L — ABNORMAL LOW (ref 135–145)
Total Bilirubin: 0.6 mg/dL (ref 0.3–1.2)
Total Protein: 7.6 g/dL (ref 6.0–8.3)

## 2012-01-10 LAB — CBC
HCT: 40.2 % (ref 39.0–52.0)
Hemoglobin: 14.1 g/dL (ref 13.0–17.0)
MCH: 30.7 pg (ref 26.0–34.0)
MCHC: 31.7 g/dL (ref 30.0–36.0)
MCHC: 35.1 g/dL (ref 30.0–36.0)
RDW: 13 % (ref 11.5–15.5)
RDW: 13.3 % (ref 11.5–15.5)
WBC: 13.9 10*3/uL — ABNORMAL HIGH (ref 4.0–10.5)

## 2012-01-10 LAB — URINALYSIS, ROUTINE W REFLEX MICROSCOPIC
Nitrite: NEGATIVE
Protein, ur: NEGATIVE mg/dL
Specific Gravity, Urine: 1.029 (ref 1.005–1.030)
Urobilinogen, UA: 0.2 mg/dL (ref 0.0–1.0)

## 2012-01-10 LAB — POCT I-STAT TROPONIN I: Troponin i, poc: 0.01 ng/mL (ref 0.00–0.08)

## 2012-01-10 LAB — GLUCOSE, CAPILLARY
Glucose-Capillary: 117 mg/dL — ABNORMAL HIGH (ref 70–99)
Glucose-Capillary: 130 mg/dL — ABNORMAL HIGH (ref 70–99)
Glucose-Capillary: 147 mg/dL — ABNORMAL HIGH (ref 70–99)
Glucose-Capillary: 194 mg/dL — ABNORMAL HIGH (ref 70–99)
Glucose-Capillary: 287 mg/dL — ABNORMAL HIGH (ref 70–99)
Glucose-Capillary: 345 mg/dL — ABNORMAL HIGH (ref 70–99)
Glucose-Capillary: >600 mg/dL (ref 70–99)
Glucose-Capillary: >600 mg/dL (ref 70–99)
Glucose-Capillary: >600 mg/dL (ref 70–99)

## 2012-01-10 LAB — URINE MICROSCOPIC-ADD ON

## 2012-01-10 LAB — RAPID URINE DRUG SCREEN, HOSP PERFORMED
Cocaine: NOT DETECTED
Opiates: NOT DETECTED

## 2012-01-10 MED ORDER — ZOLPIDEM TARTRATE 5 MG PO TABS: 10.0000 mg | ORAL_TABLET | Freq: Every evening | ORAL | Status: DC | PRN

## 2012-01-10 MED ORDER — INSULIN GLARGINE 100 UNIT/ML ~~LOC~~ SOLN
20.0000 [IU] | Freq: Once | SUBCUTANEOUS | Status: AC
  Administered 2012-01-10: 20 [IU] via SUBCUTANEOUS
  Filled 2012-01-10: qty 3

## 2012-01-10 MED ORDER — SODIUM CHLORIDE 0.9 % IV SOLN
INTRAVENOUS | Status: DC
  Administered 2012-01-10 – 2012-01-12 (×4): via INTRAVENOUS

## 2012-01-10 MED ORDER — INSULIN ASPART 100 UNIT/ML ~~LOC~~ SOLN
3.0000 [IU] | Freq: Three times a day (TID) | SUBCUTANEOUS | Status: DC
  Administered 2012-01-11 – 2012-01-12 (×3): 3 [IU] via SUBCUTANEOUS

## 2012-01-10 MED ORDER — OXYCODONE HCL 5 MG PO TABS
5.0000 mg | ORAL_TABLET | ORAL | Status: DC | PRN
  Administered 2012-01-10: 5 mg via ORAL
  Administered 2012-01-10 – 2012-01-13 (×8): 10 mg via ORAL
  Filled 2012-01-10 (×5): qty 2
  Filled 2012-01-10: qty 1
  Filled 2012-01-10 (×3): qty 2

## 2012-01-10 MED ORDER — ENOXAPARIN SODIUM 30 MG/0.3ML ~~LOC~~ SOLN
30.0000 mg | SUBCUTANEOUS | Status: DC
  Filled 2012-01-10 (×2): qty 0.3

## 2012-01-10 MED ORDER — INSULIN ASPART 100 UNIT/ML ~~LOC~~ SOLN
0.0000 [IU] | Freq: Every day | SUBCUTANEOUS | Status: DC
  Filled 2012-01-10: qty 3

## 2012-01-10 MED ORDER — DEXTROSE-NACL 5-0.45 % IV SOLN: INTRAVENOUS | Status: DC

## 2012-01-10 MED ORDER — ONDANSETRON HCL 4 MG/2ML IJ SOLN
4.0000 mg | Freq: Once | INTRAMUSCULAR | Status: AC
  Administered 2012-01-10: 4 mg via INTRAVENOUS

## 2012-01-10 MED ORDER — SERTRALINE HCL 100 MG PO TABS
100.0000 mg | ORAL_TABLET | Freq: Every day | ORAL | Status: DC
Start: 2012-01-10 — End: 2012-01-13
  Administered 2012-01-10 – 2012-01-13 (×4): 100 mg via ORAL
  Filled 2012-01-10 (×4): qty 1

## 2012-01-10 MED ORDER — PRAMIPEXOLE DIHYDROCHLORIDE 1 MG PO TABS
1.0000 mg | ORAL_TABLET | Freq: Every day | ORAL | Status: DC
  Administered 2012-01-10 – 2012-01-12 (×3): 1 mg via ORAL
  Filled 2012-01-10 (×5): qty 1

## 2012-01-10 MED ORDER — INSULIN ASPART 100 UNIT/ML ~~LOC~~ SOLN
0.0000 [IU] | Freq: Three times a day (TID) | SUBCUTANEOUS | Status: DC
  Administered 2012-01-10: 3 [IU] via SUBCUTANEOUS
  Administered 2012-01-11: 4 [IU] via SUBCUTANEOUS
  Administered 2012-01-12 (×3): 3 [IU] via SUBCUTANEOUS
  Administered 2012-01-13: 7 [IU] via SUBCUTANEOUS
  Administered 2012-01-13: 4 [IU] via SUBCUTANEOUS
  Administered 2012-01-13: 11 [IU] via SUBCUTANEOUS
  Filled 2012-01-10: qty 3

## 2012-01-10 MED ORDER — CLONAZEPAM 1 MG PO TABS
1.0000 mg | ORAL_TABLET | Freq: Two times a day (BID) | ORAL | Status: DC | PRN
  Administered 2012-01-10 – 2012-01-12 (×2): 1 mg via ORAL
  Filled 2012-01-10: qty 2
  Filled 2012-01-10: qty 1

## 2012-01-10 MED ORDER — HYDROCODONE-ACETAMINOPHEN 5-325 MG PO TABS: 1.0000 | ORAL_TABLET | ORAL | Status: DC | PRN

## 2012-01-10 MED ORDER — INSULIN GLARGINE 100 UNIT/ML ~~LOC~~ SOLN
20.0000 [IU] | Freq: Every day | SUBCUTANEOUS | Status: DC
  Administered 2012-01-10 – 2012-01-11 (×2): 20 [IU] via SUBCUTANEOUS
  Filled 2012-01-10: qty 3

## 2012-01-10 MED ORDER — INSULIN REGULAR BOLUS VIA INFUSION
0.0000 [IU] | Freq: Three times a day (TID) | INTRAVENOUS | Status: DC
  Administered 2012-01-10 (×2): 10 [IU] via INTRAVENOUS
  Filled 2012-01-10: qty 10

## 2012-01-10 MED ORDER — SODIUM CHLORIDE 0.9 % IV SOLN
INTRAVENOUS | Status: AC
  Administered 2012-01-10: 3.7 [IU]/h via INTRAVENOUS
  Filled 2012-01-10 (×2): qty 1

## 2012-01-10 MED ORDER — SODIUM CHLORIDE 0.9 % IV SOLN: INTRAVENOUS | Status: DC

## 2012-01-10 MED ORDER — CLONAZEPAM 0.5 MG PO TABS: 2.0000 mg | ORAL_TABLET | Freq: Two times a day (BID) | ORAL | Status: DC | PRN

## 2012-01-10 MED ORDER — ENOXAPARIN SODIUM 40 MG/0.4ML ~~LOC~~ SOLN
40.0000 mg | SUBCUTANEOUS | Status: DC
  Administered 2012-01-10 – 2012-01-13 (×4): 40 mg via SUBCUTANEOUS
  Filled 2012-01-10 (×4): qty 0.4

## 2012-01-10 MED ORDER — DEXTROSE 50 % IV SOLN: 25.0000 mL | INTRAVENOUS | Status: DC | PRN

## 2012-01-10 MED ORDER — SODIUM CHLORIDE 0.9 % IV SOLN
INTRAVENOUS | Status: DC
  Administered 2012-01-10 (×2): via INTRAVENOUS

## 2012-01-10 MED ORDER — PANTOPRAZOLE SODIUM 40 MG PO TBEC
40.0000 mg | DELAYED_RELEASE_TABLET | Freq: Every day | ORAL | Status: DC
  Administered 2012-01-10 – 2012-01-13 (×4): 40 mg via ORAL
  Filled 2012-01-10 (×4): qty 1

## 2012-01-10 MED ORDER — INSULIN REGULAR HUMAN 100 UNIT/ML IJ SOLN: 10.0000 [IU] | Freq: Once | INTRAMUSCULAR | Status: DC

## 2012-01-10 MED ORDER — SODIUM CHLORIDE 0.9 % IV SOLN: INTRAVENOUS | Status: AC

## 2012-01-10 MED ORDER — SODIUM CHLORIDE 0.9 % IV BOLUS (SEPSIS)
1000.0000 mL | Freq: Once | INTRAVENOUS | Status: AC
  Administered 2012-01-10: 1000 mL via INTRAVENOUS

## 2012-01-10 MED ORDER — ACETAMINOPHEN 325 MG PO TABS: 650.0000 mg | ORAL_TABLET | Freq: Four times a day (QID) | ORAL | Status: DC | PRN

## 2012-01-10 MED ORDER — ONDANSETRON HCL 4 MG/2ML IJ SOLN
INTRAMUSCULAR | Status: AC
  Filled 2012-01-10: qty 2

## 2012-01-10 MED ORDER — SODIUM CHLORIDE 0.9 % IV SOLN: Freq: Once | INTRAVENOUS | Status: DC

## 2012-01-10 MED ORDER — DEXTROSE-NACL 5-0.45 % IV SOLN
INTRAVENOUS | Status: DC
  Administered 2012-01-10: 10:00:00 via INTRAVENOUS

## 2012-01-10 MED ORDER — SODIUM CHLORIDE 0.9 % IV SOLN
INTRAVENOUS | Status: DC
  Administered 2012-01-10: 5.4 [IU]/h via INTRAVENOUS
  Filled 2012-01-10: qty 1

## 2012-01-10 NOTE — ED Notes (Signed)
Patients medication counted and verified by second rn and sent to pharmacy per policy

## 2012-01-10 NOTE — ED Notes (Signed)
Patient Lethargic but A&Ox3 reports pain in shoulder respirations even unlabored and relaxed reports elevated blood sugar

## 2012-01-10 NOTE — H&P (Signed)
[name] [name] is an 56 y.o. male.   Chief Complaint: Hyperglycemia HPI: 56 yo man with history of DM and recent admission with DKA and psych issues here with hyperglycemia, DKA, AMS and dehydration. Patient is likely non-compliant and occasionally self-inflicting cause of his problem. No fever, no chills. He is lethargic now and seems dehydrated. His PH is 7.1 and ketotic.  Past Medical History  Diagnosis Date  . Diabetes mellitus   . Hypercholesterolemia   . GERD (gastroesophageal reflux disease)   . Hypertension   . Hypercholesteremia     hx of  . Hemorrhoids     hx of  . Peripheral neuropathy   . Depression     Past Surgical History  Procedure Date  . Tonsillectomy     Family History  Problem Relation Age of Onset  . Hypotension Mother    Social History:  reports that he has never smoked. He does not have any smokeless tobacco history on file. He reports that he does not drink alcohol or use illicit drugs.  Allergies: No Known Allergies  Medications Prior to Admission  Medication Dose Route Frequency Provider Last Rate Last Dose  . 0.9 %  sodium chloride infusion   Intravenous Continuous Sunnie Nielsen, MD 150 mL/hr at 01/10/12 0512    . 0.9 %  sodium chloride infusion   Intravenous Continuous Lonia Blood, MD      . 0.9 %  sodium chloride infusion   Intravenous Continuous Lonia Blood, MD      . clonazePAM (KLONOPIN) tablet 2 mg  2 mg Oral BID PRN Lonia Blood, MD      . dextrose 5 %-0.45 % sodium chloride infusion   Intravenous Continuous Sunnie Nielsen, MD      . dextrose 5 %-0.45 % sodium chloride infusion   Intravenous Continuous Lonia Blood, MD      . dextrose 50 % solution 25 mL  25 mL Intravenous PRN Lonia Blood, MD      . enoxaparin (LOVENOX) injection 30 mg  30 mg Subcutaneous Q24H Lonia Blood, MD      . HYDROcodone-acetaminophen (NORCO) 5-325 MG per tablet 1-2 tablet  1-2 tablet Oral Q4H PRN Lonia Blood, MD      . insulin regular (NOVOLIN R,HUMULIN R) 1 Units/mL in sodium  chloride 0.9 % 100 mL infusion   Intravenous Continuous Lonia Blood, MD      . insulin regular bolus via infusion 0-10 Units  0-10 Units Intravenous TID WC Sunnie Nielsen, MD   10 Units at 01/10/12 0102  . ondansetron (ZOFRAN) injection 4 mg  4 mg Intravenous Once Sunnie Nielsen, MD   4 mg at 01/10/12 0050  . pantoprazole (PROTONIX) EC tablet 40 mg  40 mg Oral Q1200 Lonia Blood, MD      . pramipexole (MIRAPEX) tablet 1 mg  1 mg Oral QHS Lonia Blood, MD      . sertraline (ZOLOFT) tablet 100 mg  100 mg Oral Daily Lonia Blood, MD      . sodium chloride 0.9 % bolus 1,000 mL  1,000 mL Intravenous Once Sunnie Nielsen, MD   1,000 mL at 01/09/12 2359  . sodium chloride 0.9 % bolus 1,000 mL  1,000 mL Intravenous Once Sunnie Nielsen, MD   1,000 mL at 01/10/12 0313  . TDaP (BOOSTRIX) injection 0.5 mL  0.5 mL Intramuscular Once Sunnie Nielsen, MD   0.5 mL at 01/10/12 0006  . zolpidem (AMBIEN) tablet 10 mg  10 mg Oral QHS PRN Lonia Blood,  MD      . DISCONTD: dextrose 50 % solution 25 mL  25 mL Intravenous PRN Sunnie Nielsen, MD      . DISCONTD: insulin regular (NOVOLIN R,HUMULIN R) 1 Units/mL in sodium chloride 0.9 % 100 mL infusion   Intravenous Continuous Sunnie Nielsen, MD 16.2 mL/hr at 01/10/12 0309 16.2 Units/hr at 01/10/12 0309  . DISCONTD: insulin regular (NOVOLIN R,HUMULIN R) 100 units/mL injection 10 Units  10 Units Subcutaneous Once Sunnie Nielsen, MD      . DISCONTD: ondansetron [facility]) 4 MG/2ML injection            Medications Prior to Admission  Medication Sig Dispense Refill  . calcium carbonate (TUMS - DOSED IN MG ELEMENTAL CALCIUM) 500 MG chewable tablet Chew 1 tablet by mouth 3 (three) times daily as needed. As needed for indigestion.      . clonazePAM (KLONOPIN) 2 MG tablet Take 2 mg by mouth 2 (two) times daily as needed. For anxiety      . HYDROcodone-acetaminophen (VICODIN) 5-500 MG per tablet Take 1 tablet by mouth every 6 (six) hours as needed. For pain      . insulin aspart (NOVOLOG) 100 UNIT/ML injection  Inject 10 Units into the skin 3 (three) times daily with meals.      . insulin glargine (LANTUS) 100 UNIT/ML injection Inject 44 Units into the skin at bedtime.      . pantoprazole (PROTONIX) 40 MG tablet Take 40 mg by mouth daily.      . pramipexole (MIRAPEX) 1 MG tablet Take 1 mg by mouth at bedtime.      . sertraline (ZOLOFT) 50 MG tablet Take 100 mg by mouth daily.      Marland Kitchen zolpidem (AMBIEN) 10 MG tablet Take 10 mg by mouth at bedtime as needed. For sleep.        Results for orders placed during the hospital encounter of 01/09/12 (from the past 48 hour(s))  GLUCOSE, CAPILLARY     Status: Abnormal   Collection Time   01/09/12 10:27 PM      Component Value Range Comment   Glucose-Capillary >600 (*) 70 - 99 (mg/dL)   CBC     Status: Abnormal   Collection Time   01/09/12 11:49 PM      Component Value Range Comment   WBC 13.9 (*) 4.0 - 10.5 (K/uL)    RBC 5.07  4.22 - 5.81 (MIL/uL)    Hemoglobin 15.6  13.0 - 17.0 (g/dL)    HCT 21.3  08.6 - 57.8 (%)    MCV 97.0  78.0 - 100.0 (fL)    MCH 30.8  26.0 - 34.0 (pg)    MCHC 31.7  30.0 - 36.0 (g/dL)    RDW 46.9  62.9 - 52.8 (%)    Platelets 326  150 - 400 (K/uL)   COMPREHENSIVE METABOLIC PANEL     Status: Abnormal   Collection Time   01/09/12 11:49 PM      Component Value Range Comment   Sodium 124 (*) 135 - 145 (mEq/L)    Potassium 6.7 (*) 3.5 - 5.1 (mEq/L)    Chloride 84 (*) 96 - 112 (mEq/L)    CO2 11 (*) 19 - 32 (mEq/L)    Glucose, Bld 1123 (*) 70 - 99 (mg/dL)    BUN 24 (*) 6 - 23 (mg/dL)    Creatinine, Ser 4.13 (*) 0.50 - 1.35 (mg/dL)    Calcium 9.9  8.4 - 10.5 (mg/dL)    Total  Protein 7.6  6.0 - 8.3 (g/dL)    Albumin 4.2  3.5 - 5.2 (g/dL)    AST 17  0 - 37 (U/L) HEMOLYSIS AT THIS LEVEL MAY AFFECT RESULT   ALT 14  0 - 53 (U/L)    Alkaline Phosphatase 158 (*) 39 - 117 (U/L)    Total Bilirubin 0.6  0.3 - 1.2 (mg/dL)    GFR calc non Af Amer 56 (*) >90 (mL/min)    GFR calc Af Amer 65 (*) >90 (mL/min)   URINALYSIS, ROUTINE W REFLEX  MICROSCOPIC     Status: Abnormal   Collection Time   01/09/12 11:55 PM      Component Value Range Comment   Color, Urine STRAW (*) YELLOW     APPearance CLEAR  CLEAR     Specific Gravity, Urine 1.029  1.005 - 1.030     pH 5.5  5.0 - 8.0     Glucose, UA >1000 (*) NEGATIVE (mg/dL)    Hgb urine dipstick NEGATIVE  NEGATIVE     Bilirubin Urine NEGATIVE  NEGATIVE     Ketones, ur >80 (*) NEGATIVE (mg/dL)    Protein, ur NEGATIVE  NEGATIVE (mg/dL)    Urobilinogen, UA 0.2  0.0 - 1.0 (mg/dL)    Nitrite NEGATIVE  NEGATIVE     Leukocytes, UA NEGATIVE  NEGATIVE    URINE RAPID DRUG SCREEN (HOSP PERFORMED)     Status: Normal   Collection Time   01/09/12 11:55 PM      Component Value Range Comment   Opiates NONE DETECTED  NONE DETECTED     Cocaine NONE DETECTED  NONE DETECTED     Benzodiazepines NONE DETECTED  NONE DETECTED     Amphetamines NONE DETECTED  NONE DETECTED     Tetrahydrocannabinol NONE DETECTED  NONE DETECTED     Barbiturates NONE DETECTED  NONE DETECTED    URINE MICROSCOPIC-ADD ON     Status: Normal   Collection Time   01/09/12 11:55 PM      Component Value Range Comment   WBC, UA 0-2  <3 (WBC/hpf)    RBC / HPF 0-2  <3 (RBC/hpf)   GLUCOSE, CAPILLARY     Status: Abnormal   Collection Time   01/10/12 12:32 AM      Component Value Range Comment   Glucose-Capillary >600 (*) 70 - 99 (mg/dL)   GLUCOSE, CAPILLARY     Status: Abnormal   Collection Time   01/10/12  1:48 AM      Component Value Range Comment   Glucose-Capillary >600 (*) 70 - 99 (mg/dL)   POCT I-STAT TROPONIN I     Status: Normal   Collection Time   01/10/12  2:27 AM      Component Value Range Comment   Troponin i, poc 0.01  0.00 - 0.08 (ng/mL)    Comment 3            POCT I-STAT 3, BLOOD GAS (G3+)     Status: Abnormal   Collection Time   01/10/12  2:43 AM      Component Value Range Comment   pH, Arterial 7.192 (*) 7.350 - 7.450     pCO2 arterial 30.3 (*) 35.0 - 45.0 (mmHg)    pO2, Arterial 84.0  80.0 - 100.0 (mmHg)     Bicarbonate 11.6 (*) 20.0 - 24.0 (mEq/L)    TCO2 13  0 - 100 (mmol/L)    O2 Saturation 94.0      Acid-base deficit  15.0 (*) 0.0 - 2.0 (mmol/L)    Collection site RADIAL, ALLEN'S TEST ACCEPTABLE      Drawn by RT      Sample type ARTERIAL     GLUCOSE, CAPILLARY     Status: Abnormal   Collection Time   01/10/12  2:53 AM      Component Value Range Comment   Glucose-Capillary >600 (*) 70 - 99 (mg/dL)    Comment 1 Notify RN     GLUCOSE, CAPILLARY     Status: Abnormal   Collection Time   01/10/12  3:58 AM      Component Value Range Comment   Glucose-Capillary 482 (*) 70 - 99 (mg/dL)   MRSA PCR SCREENING     Status: Normal   Collection Time   01/10/12  3:59 AM      Component Value Range Comment   MRSA by PCR NEGATIVE  NEGATIVE    GLUCOSE, CAPILLARY     Status: Abnormal   Collection Time   01/10/12  5:14 AM      Component Value Range Comment   Glucose-Capillary 345 (*) 70 - 99 (mg/dL)   GLUCOSE, CAPILLARY     Status: Abnormal   Collection Time   01/10/12  6:21 AM      Component Value Range Comment   Glucose-Capillary 287 (*) 70 - 99 (mg/dL)    Dg Chest Portable 1 View  01/10/2012  *RADIOLOGY REPORT*  Clinical Data: Hyperglycemia.  Diabetes.  Hypertension.  PORTABLE CHEST - 1 VIEW  Comparison: 11/18/2011  Findings: Shallow inspiration.  Normal heart size and pulmonary vascularity.  No focal airspace consolidation in the lungs.  No blunting of costophrenic angles.  No pneumothorax.  Stable appearance since previous study.  IMPRESSION: No evidence of active pulmonary disease.  Original Report Authenticated By: Marlon Pel, M.D.    Review of Systems  Constitutional: Positive for malaise/fatigue.  Musculoskeletal: Positive for myalgias.  Neurological: Positive for loss of consciousness and weakness.  Psychiatric/Behavioral: The patient is nervous/anxious.   All other systems reviewed and are negative.    Blood pressure 111/58, pulse 103, temperature 98.1 F (36.7 C), temperature  source Oral, resp. rate 19, height 5\' 9"  (1.753 m), weight 84.1 kg (185 lb 6.5 oz), SpO2 98.00%. Physical Exam  Constitutional: He appears well-developed and well-nourished. He appears lethargic.  HENT:  Head: Normocephalic and atraumatic.  Right Ear: External ear normal.  Left Ear: External ear normal.  Nose: Nose normal.  Mouth/Throat: Oropharynx is clear and moist.  Eyes: Conjunctivae and EOM are normal. Pupils are equal, round, and reactive to light.  Neck: Normal range of motion. Neck supple.  Cardiovascular: Normal rate, regular rhythm, normal heart sounds and intact distal pulses.   Respiratory: Effort normal and breath sounds normal.  GI: Soft. Bowel sounds are normal.  Neurological: He appears lethargic.  Skin: Skin is warm and dry.  Psychiatric: His speech is tangential. He is slowed.     Assessment/Plan 1. DKA: Admit to step down, IV Insulin, hydrate, monitor closely 2. Hyperkalemia: related to DKA. No EKG changes. Monitor level closely and see if it drops with IV insulin 3. AMS: From DKA 4. Depression/Psych: Continue home medications  GARBA,LAWAL 01/10/2012, 7:09 AM
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
## 2012-01-10 NOTE — Progress Notes (Signed)
[facility] [place] TEAM 8  Subjective: 56 yo man with history of DM and recent admission with DKA and psych issues here with hyperglycemia, DKA, AMS and dehydration. The patient actually presented to the ER 01/09/2012, but then left AMA, before returning an hour or so later.    Objective: Weight change:   Intake/Output Summary (Last 24 hours) at 01/10/12 0752 Last data filed at 01/10/12 0318  Gross per 24 hour  Intake      0 ml  Output    800 ml  Net   -800 ml   Blood pressure 111/58, pulse 103, temperature 98.1 F (36.7 C), temperature source Oral, resp. rate 19, height 5\' 9"  (1.753 m), weight 84.1 kg (185 lb 6.5 oz), SpO2 98.00%.  Physical Exam: F/U exam completed  Lab Results:  [facility] 01/09/12 2349  NA 124*  K 6.7*  CL 84*  CO2 11*  GLUCOSE 1123*  BUN 24*  CREATININE 1.40*  CALCIUM 9.9  MG --  PHOS --    Basename 01/09/12 2349  AST 17  ALT 14  ALKPHOS 158*  BILITOT 0.6  PROT 7.6  ALBUMIN 4.2    Basename 01/09/12 2349  WBC 13.9*  NEUTROABS --  HGB 15.6  HCT 49.2  MCV 97.0  PLT 326   No results found for this basename: CKTOTAL:3,CKMB:3,CKMBINDEX:3,TROPONINI:3 in the last 72 hours No results found for this basename: HGBA1C:12 in the last 72 hours  Micro Results: Recent Results (from the past 240 hour(s))  MRSA PCR SCREENING     Status: Normal   Collection Time   01/10/12  3:59 AM      Component Value Range Status Comment   MRSA by PCR NEGATIVE  NEGATIVE  Final     Studies/Results: All recent x-ray/radiology reports have been reviewed in detail.   Medications: I have reviewed the patient's complete medication list.  Assessment/Plan:  DKA Despite being on insulin gtt for DKA, BMET has not been assessed since midnight - RN has spoken with lab - orders for Q2hr BMET were entered at 03:58, but have not been followed  Hyperkalemia F/U BMETs  Altered MS Improving at time of my exam  Uncontrolled  DM  GERD  HTN  Hypercholesterolemia  Bipolar D/O  Lonia Blood, MD [facility] Office  [phone] Pager [phone]  On-Call/Text Page:      [email or web address]      password [facility]

## 2012-01-10 NOTE — ED Provider Notes (Signed)
History     CSN: [id number]  Arrival date & time 01/09/12  2221   First MD Initiated Contact with Patient 01/09/12 2347      Chief Complaint  Patient presents with  . Hyperglycemia    (Consider location/radiation/quality/duration/timing/severity/associated sxs/prior treatment) The history is provided by the patient.  Generalized weakness and elevated blood sugar in patient with diabetes and bipolar disorder. He denies any trauma, CP or SOB. He is a limited historian secondary to his condition. He denies any fall or syncope. His blood sugars today are elevated/ not detectable. Moderate to severe weakness, having trouble walking. No F/C, no N/V/D. He denies any drug overdose with staff reporting that he has a bottle of klonopin with him but no other medications. He has some shoulder pain. No neck, arm or back pain.  He denies OD, SI, or HI. Onset unknown worse today. No known aggrevating or alleviating factors.    Past Medical History  Diagnosis Date  . Diabetes mellitus   . Hypercholesterolemia   . GERD (gastroesophageal reflux disease)   . Hypertension   . Hypercholesteremia     hx of  . Hemorrhoids     hx of  . Peripheral neuropathy   . Depression     Past Surgical History  Procedure Date  . Tonsillectomy     Family History  Problem Relation Age of Onset  . Hypotension Mother     History  Substance Use Topics  . Smoking status: Never Smoker   . Smokeless tobacco: Not on file  . Alcohol Use: No      Review of Systems  Constitutional: Negative for fever and chills.  HENT: Negative for neck pain and neck stiffness.   Eyes: Negative for pain.  Respiratory: Negative for shortness of breath.   Cardiovascular: Negative for chest pain.  Gastrointestinal: Negative for vomiting and abdominal pain.  Genitourinary: Negative for dysuria.  Musculoskeletal: Negative for back pain.  Skin: Negative for rash.  Neurological: Positive for weakness. Negative for seizures,  syncope, speech difficulty and headaches.  All other systems reviewed and are negative.    Allergies  Review of patient's allergies indicates no known allergies.  Home Medications   Current Outpatient Rx  Name Route Sig Dispense Refill  . CALCIUM CARBONATE ANTACID 500 MG PO CHEW Oral Chew 1 tablet by mouth 3 (three) times daily as needed. As needed for indigestion.    Marland Kitchen CLONAZEPAM 2 MG PO TABS Oral Take 2 mg by mouth 2 (two) times daily as needed. For anxiety    . HYDROCODONE-ACETAMINOPHEN 5-500 MG PO TABS Oral Take 1 tablet by mouth every 6 (six) hours as needed. For pain    . INSULIN ASPART 100 UNIT/ML [place] SOLN Subcutaneous Inject 10 Units into the skin 3 (three) times daily with meals.    . INSULIN GLARGINE 100 UNIT/ML [place] SOLN Subcutaneous Inject 44 Units into the skin at bedtime.    Marland Kitchen PANTOPRAZOLE SODIUM 40 MG PO TBEC Oral Take 40 mg by mouth daily.    Marland Kitchen PRAMIPEXOLE DIHYDROCHLORIDE 1 MG PO TABS Oral Take 1 mg by mouth at bedtime.    . SERTRALINE HCL 50 MG PO TABS Oral Take 100 mg by mouth daily.    Marland Kitchen ZOLPIDEM TARTRATE 10 MG PO TABS Oral Take 10 mg by mouth at bedtime as needed. For sleep.      BP 125/66  Pulse 115  Temp(Src) 98.5 F (36.9 C) (Oral)  Resp 18  SpO2 98%  Physical Exam  Constitutional: He is oriented to person, place, and time. He appears well-developed and well-nourished.  HENT:  Head: Normocephalic and atraumatic.  Eyes: Conjunctivae and EOM are normal. Pupils are equal, round, and reactive to light.  Neck: Trachea normal. Neck supple. No thyromegaly present.  Cardiovascular: Normal rate, regular rhythm, S1 normal, S2 normal and normal pulses.     No systolic murmur is present   No diastolic murmur is present  Pulses:      Radial pulses are 2+ on the right side, and 2+ on the left side.  Pulmonary/Chest: Effort normal and breath sounds normal. He has no wheezes. He has no rhonchi. He has no rales. He exhibits no tenderness.  Abdominal: Soft. Normal  appearance and bowel sounds are normal. There is no tenderness. There is no CVA tenderness and negative Murphy's sign.  Musculoskeletal:       MAE x 4 no deficits  Neurological: He is alert and oriented to person, place, and time. He has normal strength. No cranial nerve deficit or sensory deficit. GCS eye subscore is 4. GCS verbal subscore is 5. GCS motor subscore is 6.       Speech is slow, A/O with equal grips and dorsi/ plantar flexion.   Skin: Skin is warm and dry. No rash noted. He is not diaphoretic.  Psychiatric: His speech is normal.       Cooperative and appropriate    ED Course  Procedures (including critical care time)  Results for orders placed during the hospital encounter of 01/09/12  GLUCOSE, CAPILLARY      Component Value Range   Glucose-Capillary >600 (*) 70 - 99 (mg/dL)  CBC      Component Value Range   WBC 13.9 (*) 4.0 - 10.5 (K/uL)   RBC 5.07  4.22 - 5.81 (MIL/uL)   Hemoglobin 15.6  13.0 - 17.0 (g/dL)   HCT 16.1  09.6 - 04.5 (%)   MCV 97.0  78.0 - 100.0 (fL)   MCH 30.8  26.0 - 34.0 (pg)   MCHC 31.7  30.0 - 36.0 (g/dL)   RDW 40.9  81.1 - 91.4 (%)   Platelets 326  150 - 400 (K/uL)  COMPREHENSIVE METABOLIC PANEL      Component Value Range   Sodium 124 (*) 135 - 145 (mEq/L)   Potassium 6.7 (*) 3.5 - 5.1 (mEq/L)   Chloride 84 (*) 96 - 112 (mEq/L)   CO2 11 (*) 19 - 32 (mEq/L)   Glucose, Bld 1123 (*) 70 - 99 (mg/dL)   BUN 24 (*) 6 - 23 (mg/dL)   Creatinine, Ser 7.82 (*) 0.50 - 1.35 (mg/dL)   Calcium 9.9  8.4 - 95.6 (mg/dL)   Total Protein 7.6  6.0 - 8.3 (g/dL)   Albumin 4.2  3.5 - 5.2 (g/dL)   AST 17  0 - 37 (U/L)   ALT 14  0 - 53 (U/L)   Alkaline Phosphatase 158 (*) 39 - 117 (U/L)   Total Bilirubin 0.6  0.3 - 1.2 (mg/dL)   GFR calc non Af Amer 56 (*) >90 (mL/min)   GFR calc Af Amer 65 (*) >90 (mL/min)  URINALYSIS, ROUTINE W REFLEX MICROSCOPIC      Component Value Range   Color, Urine STRAW (*) YELLOW    APPearance CLEAR  CLEAR    Specific Gravity,  Urine 1.029  1.005 - 1.030    pH 5.5  5.0 - 8.0    Glucose, UA >1000 (*) NEGATIVE (mg/dL)   Hgb urine  dipstick NEGATIVE  NEGATIVE    Bilirubin Urine NEGATIVE  NEGATIVE    Ketones, ur >80 (*) NEGATIVE (mg/dL)   Protein, ur NEGATIVE  NEGATIVE (mg/dL)   Urobilinogen, UA 0.2  0.0 - 1.0 (mg/dL)   Nitrite NEGATIVE  NEGATIVE    Leukocytes, UA NEGATIVE  NEGATIVE   URINE RAPID DRUG SCREEN (HOSP PERFORMED)      Component Value Range   Opiates NONE DETECTED  NONE DETECTED    Cocaine NONE DETECTED  NONE DETECTED    Benzodiazepines NONE DETECTED  NONE DETECTED    Amphetamines NONE DETECTED  NONE DETECTED    Tetrahydrocannabinol NONE DETECTED  NONE DETECTED    Barbiturates NONE DETECTED  NONE DETECTED   URINE MICROSCOPIC-ADD ON      Component Value Range   WBC, UA 0-2  <3 (WBC/hpf)   RBC / HPF 0-2  <3 (RBC/hpf)  GLUCOSE, CAPILLARY      Component Value Range   Glucose-Capillary >600 (*) 70 - 99 (mg/dL)  GLUCOSE, CAPILLARY      Component Value Range   Glucose-Capillary >600 (*) 70 - 99 (mg/dL)  POCT I-STAT TROPONIN I      Component Value Range   Troponin i, poc 0.01  0.00 - 0.08 (ng/mL)   Comment 3           POCT I-STAT 3, BLOOD GAS (G3+)      Component Value Range   pH, Arterial 7.192 (*) 7.350 - 7.450    pCO2 arterial 30.3 (*) 35.0 - 45.0 (mmHg)   pO2, Arterial 84.0  80.0 - 100.0 (mmHg)   Bicarbonate 11.6 (*) 20.0 - 24.0 (mEq/L)   TCO2 13  0 - 100 (mmol/L)   O2 Saturation 94.0     Acid-base deficit 15.0 (*) 0.0 - 2.0 (mmol/L)   Collection site RADIAL, ALLEN'S TEST ACCEPTABLE     Drawn by RT     Sample type ARTERIAL    GLUCOSE, CAPILLARY      Component Value Range   Glucose-Capillary >600 (*) 70 - 99 (mg/dL)   Comment 1 Notify RN     Dg Chest Portable 1 View  01/10/2012  *RADIOLOGY REPORT*  Clinical Data: Hyperglycemia.  Diabetes.  Hypertension.  PORTABLE CHEST - 1 VIEW  Comparison: 11/18/2011  Findings: Shallow inspiration.  Normal heart size and pulmonary vascularity.  No focal  airspace consolidation in the lungs.  No blunting of costophrenic angles.  No pneumothorax.  Stable appearance since previous study.  IMPRESSION: No evidence of active pulmonary disease.  Original Report Authenticated By: Marlon Pel, M.D.    2:19 AM discussed with Dr. Mikeal Hawthorne, on call for triad hospitalist, will see in emergency department for admit step down  CRITICAL CARE Performed by: [facility]   Total critical care time: 45  Critical care time was exclusive of separately billable procedures and treating other patients.  Critical care was necessary to treat or prevent imminent or life-threatening deterioration.  Critical care was time spent personally by me on the following activities: development of treatment plan with patient and/or surrogate as well as nursing, discussions with consultants, evaluation of patient's response to treatment, examination of patient, obtaining history from patient or surrogate, ordering and performing treatments and interventions, ordering and review of laboratory studies, ordering and review of radiographic studies, pulse oximetry and re-evaluation of patient's condition. IV insulin drip. Treated for hyperkalemia. Serial evaluations for altered mental status in the setting of uncontrolled diabetes. IVF resus.   Date: 01/10/2012  Rate: 108  Rhythm: sinus tachycardia  QRS Axis: normal  Intervals: normal  ST/T Wave abnormalities: nonspecific ST/T changes  Conduction Disutrbances:none  Narrative Interpretation: tall T waves  Old EKG Reviewed: changes noted    MDM   Admit step down ICU for weakness with acidosis, hyperkalemia, hyperglycemia.        Sunnie Nielsen, MD 01/10/12 [phone]

## 2012-01-11 ENCOUNTER — Encounter (HOSPITAL_COMMUNITY): Payer: Self-pay

## 2012-01-11 LAB — GLUCOSE, CAPILLARY
Glucose-Capillary: 108 mg/dL — ABNORMAL HIGH (ref 70–99)
Glucose-Capillary: 181 mg/dL — ABNORMAL HIGH (ref 70–99)
Glucose-Capillary: 166 mg/dL — ABNORMAL HIGH (ref 70–99)

## 2012-01-11 LAB — BASIC METABOLIC PANEL
BUN: 12 mg/dL (ref 6–23)
CO2: 21 mEq/L (ref 19–32)
Calcium: 8.5 mg/dL (ref 8.4–10.5)
Creatinine, Ser: 0.77 mg/dL (ref 0.50–1.35)
GFR calc non Af Amer: >90 mL/min (ref >90)
Glucose, Bld: 163 mg/dL — ABNORMAL HIGH (ref 70–99)
Sodium: 136 mEq/L (ref 135–145)

## 2012-01-11 MED ORDER — POTASSIUM CHLORIDE CRYS ER 20 MEQ PO TBCR
40.0000 meq | EXTENDED_RELEASE_TABLET | Freq: Every day | ORAL | Status: AC
  Administered 2012-01-11 – 2012-01-13 (×3): 40 meq via ORAL
  Filled 2012-01-11 (×3): qty 2

## 2012-01-11 NOTE — Progress Notes (Signed)
[facility] [place] TEAM 8  Subjective: 56 yo man with history of DM and recent admission with DKA and psych issues here with hyperglycemia, DKA, AMS and dehydration. The patient actually presented to the ER 01/09/2012, but then left AMA, before returning an hour or so later.    Today he is doing well medically.  He denies sob, n/v, abdom pain, or cp.  His affect is markedly flat.  He denies SI, but admits to severe depression and apathy.  Objective: Weight change: 1.4 kg (3 lb 1.4 oz)  Intake/Output Summary (Last 24 hours) at 01/11/12 1356 Last data filed at 01/11/12 1221  Gross per 24 hour  Intake 2241.6 ml  Output   1075 ml  Net 1166.6 ml   Blood pressure 108/75, pulse 74, temperature 98.5 F (36.9 C), temperature source Oral, resp. rate 9, height 5\' 9"  (1.753 m), weight 90.6 kg (199 lb 11.8 oz), SpO2 96.00%.  Physical Exam:  General: No acute respiratory distress Lungs: Clear to auscultation bilaterally without wheezes or crackles Cardiovascular: Regular rate and rhythm without murmur gallop or rub normal S1 and S2 Abdomen: Nontender, nondistended, soft, bowel sounds positive, no rebound, no ascites, no appreciable mass Extremities: No significant cyanosis, clubbing, or edema bilateral lower extremities  Lab Results:  Basename 01/11/12 0457 01/10/12 1919 01/10/12 1230  NA 136 141 144  K 3.3* 3.7 4.2  CL 108 110 114*  CO2 21 21 21   GLUCOSE 163* 208* 138*  BUN 12 14 16   CREATININE 0.77 0.84 0.96  CALCIUM 8.5 8.6 9.3  MG -- -- --  PHOS -- -- --    Basename 01/09/12 2349  AST 17  ALT 14  ALKPHOS 158*  BILITOT 0.6  PROT 7.6  ALBUMIN 4.2    Basename 01/10/12 1230 01/09/12 2349  WBC 17.0* 13.9*  NEUTROABS -- --  HGB 14.1 15.6  HCT 40.2 49.2  MCV 87.4 97.0  PLT 291 326   Micro Results: Recent Results (from the past 240 hour(s))  MRSA PCR SCREENING     Status: Normal   Collection Time   01/10/12  3:59 AM      Component Value Range Status Comment     MRSA by PCR NEGATIVE  NEGATIVE  Final     Studies/Results: All recent x-ray/radiology reports have been reviewed in detail.   Medications: I have reviewed the patient's complete medication list.  Assessment/Plan:  DKA DKA is now resolved - off insulin gtt - doing well   Hyperkalemia -> hypokalemia Due to DKA and tx of same - replace and follow - check Mg  Altered MS Appears to have returned to baseline MS, but is quite withdrawn  Uncontrolled DM Much improved at this time - cont to titrate meds and follow to establish best regimen for home tx  GERD Controlled  HTN Well controlled   Hypercholesterolemia Does not appear to be on meds at home - check fasting lipid panel to determine if tx is indiacted  Depression Pt is severely depressed - he denies SI, but appears to have complete apathy in regard to his medical care - this is quite dangerous in the pt who has a propensity for DKA - I do not feel it is safe for him to be D/C until this is corrected - I have requested a Pscyh consult   Dispo Medically stable for transfer to medical bed, and for dispo to psch facility if felt to be appropriate by the Psych MD  Lonia Blood,  MD [facility] Office  [phone] Pager [phone]  On-Call/Text Page:      [email or web address]      password [facility]

## 2012-01-11 NOTE — Progress Notes (Signed)
   CARE MANAGEMENT NOTE 01/11/2012  Patient:  [name] [name],[name] [name]   Account Number:  [id number]  Date Initiated:  01/11/2012  Documentation initiated by:  Onnie Boer  Subjective/Objective Assessment:   PT WAS ADMITTED WITH DKA     Action/Plan:   PROGRESSION OF CARE AND DISCHARGE PLANNING   Anticipated DC Date:  01/13/2012   Anticipated DC Plan:        DC Planning Services  CM consult      Choice offered to / List presented to:             Status of service:  In process, will continue to follow Medicare Important Message given?   (If response is "NO", the following Medicare IM given date fields will be blank) Date Medicare IM given:   Date Additional Medicare IM given:    Discharge Disposition:    Per UR Regulation:  Reviewed for med. necessity/level of care/duration of stay  Comments:  UR COMPLETED.  Onnie Boer, RN, BSN 01/11/12 1409 PT WAS ADMITTED WITH DKA FROM HOME WITH SELF CARE.  WILL ASSIST PT WITH A HEALTH SERVE APPT AND ANY OTHER NEEDS.

## 2012-01-12 DIAGNOSIS — F3130 Bipolar disorder, current episode depressed, mild or moderate severity, unspecified: Secondary | ICD-10-CM

## 2012-01-12 LAB — BASIC METABOLIC PANEL
BUN: 8 mg/dL (ref 6–23)
Chloride: 110 mEq/L (ref 96–112)
GFR calc Af Amer: >90 mL/min (ref >90)
GFR calc non Af Amer: >90 mL/min (ref >90)
Potassium: 3.9 mEq/L (ref 3.5–5.1)
Sodium: 140 mEq/L (ref 135–145)

## 2012-01-12 LAB — CBC
HCT: 36.6 % — ABNORMAL LOW (ref 39.0–52.0)
Hemoglobin: 12.5 g/dL — ABNORMAL LOW (ref 13.0–17.0)
MCHC: 34.2 g/dL (ref 30.0–36.0)
RDW: 13 % (ref 11.5–15.5)
WBC: 4.6 10*3/uL (ref 4.0–10.5)

## 2012-01-12 LAB — GLUCOSE, CAPILLARY: Glucose-Capillary: 128 mg/dL — ABNORMAL HIGH (ref 70–99)

## 2012-01-12 LAB — LIPID PANEL
Cholesterol: 199 mg/dL (ref 0–200)
HDL: 41 mg/dL (ref >39)

## 2012-01-12 MED ORDER — INSULIN GLARGINE 100 UNIT/ML ~~LOC~~ SOLN
23.0000 [IU] | Freq: Every day | SUBCUTANEOUS | Status: DC
  Administered 2012-01-12: 23 [IU] via SUBCUTANEOUS

## 2012-01-12 MED ORDER — ROSUVASTATIN CALCIUM 5 MG PO TABS
5.0000 mg | ORAL_TABLET | Freq: Every day | ORAL | Status: DC
  Administered 2012-01-12 – 2012-01-13 (×2): 5 mg via ORAL
  Filled 2012-01-12 (×2): qty 1

## 2012-01-12 MED ORDER — INSULIN ASPART 100 UNIT/ML ~~LOC~~ SOLN
5.0000 [IU] | Freq: Three times a day (TID) | SUBCUTANEOUS | Status: DC
  Administered 2012-01-12 – 2012-01-13 (×5): 5 [IU] via SUBCUTANEOUS
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
## 2012-01-12 NOTE — Progress Notes (Signed)
   CARE MANAGEMENT NOTE 01/12/2012  Patient:  [name] [name],[name] [name]   Account Number:  [id number]  Date Initiated:  01/11/2012  Documentation initiated by:  Onnie Boer  Subjective/Objective Assessment:   PT WAS ADMITTED WITH DKA     Action/Plan:   PROGRESSION OF CARE AND DISCHARGE PLANNING   Anticipated DC Date:  01/13/2012   Anticipated DC Plan:  HOME/SELF CARE      DC Planning Services  CM consult      Choice offered to / List presented to:             Status of service:  In process, will continue to follow Medicare Important Message given?   (If response is "NO", the following Medicare IM given date fields will be blank) Date Medicare IM given:   Date Additional Medicare IM given:    Discharge Disposition:    Per UR Regulation:  Reviewed for med. necessity/level of care/duration of stay  Comments:  PCP- Norberto Sorenson- [place] [place] (pt has upcoming appointment on Mar. 12 at 3:15)  01/12/12- 1200- Donn Pierini RN, BSN [phone] Spoke with pt at bedside per conversation pt states that he goes to [facility] for his PCP and medications. He reports that he is current in his certification and that he has an upcoming appointment next month (see above). Pt states that he either gets his medications from [facility] or [facility]. Pt is concerned about an appointment he has upcoming on this Thurs. Feb. 14 that he would like to be able to keep it is at 4:30. Pt is hopeful for discharge by then so that he can keep this appointment. CM to follow, no anticipated d/c needs.    UR COMPLETED.  Onnie Boer, RN, BSN 01/11/12 1409 PT WAS ADMITTED WITH DKA FROM HOME WITH SELF CARE.  WILL ASSIST PT WITH A HEALTH SERVE APPT AND ANY OTHER NEEDS.

## 2012-01-12 NOTE — Progress Notes (Signed)
Subjective: Laying in bed- relays he is not sure why he went into DKA- thinks maybe one of his diabetic pens were not working properly.   No CP, no SOB  Objective: Vital signs in last 24 hours: Filed Vitals:   01/11/12 1500 01/11/12 1600 01/11/12 2152 01/12/12 0532  BP: 110/65 101/64 119/82 132/83  Pulse: 76 80 76 75  Temp:   98 F (36.7 C) 97.5 F (36.4 C)  TempSrc:      Resp: 11 9 20 18   Height:      Weight:      SpO2: 97% 95% 96% 96%   Weight change:   Intake/Output Summary (Last 24 hours) at 01/12/12 1041 Last data filed at 01/12/12 0600  Gross per 24 hour  Intake   1060 ml  Output    500 ml  Net    560 ml    Physical Exam: General: Awake, Oriented, No acute distress, flat affect HEENT: EOMI. Neck: Supple CV: S1 and S2, RRR Lungs: Clear to ascultation bilaterally Abdomen: Soft, Nontender, Nondistended, +bowel sounds. Ext: Good pulses. Trace edema.   Lab Results:  [facility] 01/12/12 0605 01/11/12 0457  NA 140 136  K 3.9 3.3*  CL 110 108  CO2 21 21  GLUCOSE 142* 163*  BUN 8 12  CREATININE 0.74 0.77  CALCIUM 8.3* 8.5  MG -- --  PHOS -- --    Basename 01/09/12 2349  AST 17  ALT 14  ALKPHOS 158*  BILITOT 0.6  PROT 7.6  ALBUMIN 4.2     Basename 01/12/12 0605 01/10/12 1230  WBC 4.6 17.0*  NEUTROABS -- --  HGB 12.5* 14.1  HCT 36.6* 40.2  MCV 87.8 87.4  PLT 235 291        Basename 01/12/12 0605  CHOL 199  HDL 41  LDLCALC 132*  TRIG 131  CHOLHDL 4.9  LDLDIRECT --      Micro Results: Recent Results (from the past 240 hour(s))  MRSA PCR SCREENING     Status: Normal   Collection Time   01/10/12  3:59 AM      Component Value Range Status Comment   MRSA by PCR NEGATIVE  NEGATIVE  Final     Studies/Results: No results found.  Medications: I have reviewed the patient's current medications. Scheduled Meds:   . enoxaparin  40 mg Subcutaneous Q24H  . insulin aspart  0-20 Units Subcutaneous TID WC  . insulin aspart  0-5 Units  Subcutaneous QHS  . insulin aspart  3 Units Subcutaneous TID WC  . insulin glargine  20 Units Subcutaneous QHS  . pantoprazole  40 mg Oral Q1200  . potassium chloride  40 mEq Oral Daily  . pramipexole  1 mg Oral QHS  . sertraline  100 mg Oral Daily   Continuous Infusions:   . sodium chloride 50 mL/hr at 01/11/12 1701   PRN Meds:.acetaminophen, clonazePAM, dextrose, oxyCODONE  Assessment/Plan: DKA resolved,   Hyperkalemia -> hypokalemia  Both resolved  Altered MS  Appears to have returned to baseline MS, flat affect and reserved  Uncontrolled DM  Much improved at this time - cont to titrate meds and follow to establish best regimen for home tx - SSI (resistant), plus novalog 3 units, plus lantus 20 units- will increase novalog and lantus BS 128-181  GERD  Controlled   HTN  Well controlled   Hypercholesterolemia  Start statin  Depression  Pt is severely depressed - he denies SI, Psych to see patient  Dispo  Per psych- will continue to maximize diabetic regimine     LOS: 3 days  Saydee Zolman, DO 01/12/2012, 10:41 AM

## 2012-01-12 NOTE — Progress Notes (Signed)
CSW received referral for pt other psychosocial needs. Per discussion with pt MD and medical team, there is consideration for a psych evaluation. Pt has been transferred from team 8 to team 1 hospitalist service, and appropriate csw of team 1 hospitalist was informed. .Clinical social worker continuing to follow pt to assist with pt dc plans and further csw needs, please contact team 1.   .Catha Gosselin, Theresia Majors  [phone] .01/12/2012 8:58am

## 2012-01-12 NOTE — Consult Note (Signed)
Reason for Consult:  Depression, R/O suicide attempt  Referring Physician: Dr. Angelica Ran [name] is an 56 y.o. male.  HPI:  Pt states he came in to hospital with ketoacidosis.  He says he uses insulin but does not always check his bl. Glucose.  He also c/o knee and R shoulder pain. - not sure why.   AXIS  I Depression, R/O suicide attempt  AXIS II Deferred AXIS III Past Medical History  Diagnosis Date  . Diabetes mellitus   . Hypercholesterolemia   . GERD (gastroesophageal reflux disease)   . Hypertension   . Hypercholesteremia     hx of  . Hemorrhoids     hx of  . Peripheral neuropathy   . Depression     Past Surgical History  Procedure Date  . Tonsillectomy   AXIS IV  Family/marital conflict, finances health AXIS V  GAF 45  Family History  Problem Relation Age of Onset  . Hypotension Mother     Social History:  reports that he has never smoked. He does not have any smokeless tobacco history on file. He reports that he does not drink alcohol or use illicit drugs.  Allergies: No Known Allergies  Medications: I have reviewed patient's current medications  Results for orders placed during the hospital encounter of 01/09/12 (from the past 48 hour(s))  BASIC METABOLIC PANEL     Status: Abnormal   Collection Time   01/11/12  4:57 AM      Component Value Range Comment   Sodium 136  135 - 145 (mEq/L)    Potassium 3.3 (*) 3.5 - 5.1 (mEq/L)    Chloride 108  96 - 112 (mEq/L)    CO2 21  19 - 32 (mEq/L)    Glucose, Bld 163 (*) 70 - 99 (mg/dL)    BUN 12  6 - 23 (mg/dL)    Creatinine, Ser 1.61  0.50 - 1.35 (mg/dL)    Calcium 8.5  8.4 - 10.5 (mg/dL)    GFR calc non Af Amer >90  >90 (mL/min)    GFR calc Af Amer >90  >90 (mL/min)   GLUCOSE, CAPILLARY     Status: Abnormal   Collection Time   01/11/12  8:31 AM      Component Value Range Comment   Glucose-Capillary 100 (*) 70 - 99 (mg/dL)    Comment 1 Notify RN     GLUCOSE, CAPILLARY     Status: Abnormal   Collection Time     01/11/12 12:18 PM      Component Value Range Comment   Glucose-Capillary 108 (*) 70 - 99 (mg/dL)    Comment 1 Notify RN     GLUCOSE, CAPILLARY     Status: Abnormal   Collection Time   01/11/12  4:48 PM      Component Value Range Comment   Glucose-Capillary 181 (*) 70 - 99 (mg/dL)   GLUCOSE, CAPILLARY     Status: Abnormal   Collection Time   01/11/12  9:59 PM      Component Value Range Comment   Glucose-Capillary 166 (*) 70 - 99 (mg/dL)    Comment 1 Notify RN     BASIC METABOLIC PANEL     Status: Abnormal   Collection Time   01/12/12  6:05 AM      Component Value Range Comment   Sodium 140  135 - 145 (mEq/L)    Potassium 3.9  3.5 - 5.1 (mEq/L)    Chloride 110  96 -  112 (mEq/L)    CO2 21  19 - 32 (mEq/L)    Glucose, Bld 142 (*) 70 - 99 (mg/dL)    BUN 8  6 - 23 (mg/dL)    Creatinine, Ser 1.61  0.50 - 1.35 (mg/dL)    Calcium 8.3 (*) 8.4 - 10.5 (mg/dL)    GFR calc non Af Amer >90  >90 (mL/min)    GFR calc Af Amer >90  >90 (mL/min)   CBC     Status: Abnormal   Collection Time   01/12/12  6:05 AM      Component Value Range Comment   WBC 4.6  4.0 - 10.5 (K/uL)    RBC 4.17 (*) 4.22 - 5.81 (MIL/uL)    Hemoglobin 12.5 (*) 13.0 - 17.0 (g/dL)    HCT 09.6 (*) 04.5 - 52.0 (%)    MCV 87.8  78.0 - 100.0 (fL)    MCH 30.0  26.0 - 34.0 (pg)    MCHC 34.2  30.0 - 36.0 (g/dL)    RDW 40.9  81.1 - 91.4 (%)    Platelets 235  150 - 400 (K/uL)   LIPID PANEL     Status: Abnormal   Collection Time   01/12/12  6:05 AM      Component Value Range Comment   Cholesterol 199  0 - 200 (mg/dL)    Triglycerides 782  <150 (mg/dL)    HDL 41  >95 (mg/dL)    Total CHOL/HDL Ratio 4.9      VLDL 26  0 - 40 (mg/dL)    LDL Cholesterol 621 (*) 0 - 99 (mg/dL)   GLUCOSE, CAPILLARY     Status: Abnormal   Collection Time   01/12/12  6:37 AM      Component Value Range Comment   Glucose-Capillary 128 (*) 70 - 99 (mg/dL)    Comment 1 Notify RN     GLUCOSE, CAPILLARY     Status: Abnormal   Collection Time   01/12/12  11:24 AM      Component Value Range Comment   Glucose-Capillary 125 (*) 70 - 99 (mg/dL)   GLUCOSE, CAPILLARY     Status: Abnormal   Collection Time   01/12/12  3:46 PM      Component Value Range Comment   Glucose-Capillary 145 (*) 70 - 99 (mg/dL)   GLUCOSE, CAPILLARY     Status: Abnormal   Collection Time   01/12/12  9:59 PM      Component Value Range Comment   Glucose-Capillary 101 (*) 70 - 99 (mg/dL)     No results found.  Review of Systems  Unable to perform ROS: other   Blood pressure 137/77, pulse 69, temperature 98.1 F (36.7 C), temperature source Oral, resp. rate 18, height 5\' 11"  (1.803 m), weight 82.101 kg (181 lb), SpO2 99.00%. Physical Exam  Assessment/Plan: Chart is reviewed Pt is interviewed.  He is sitting up.  He looks drowsy.  He maintains eye contact and has spontaneous speech.  He has a slow rate of speech and monotone voice.  He says he cannot think so well after blood glucose was high.  He says he uses insulin but does not check bl. Glucose routinely.  His wife had an accident and is on workman's comp.  She is at home.  He says there are problems; does not give details.  He says she tries to follow his diabetic diet.  He has gone to inpatient diabetic classes and says he is going  to try to be more careful with bl glucose.  He denies suicidal ideation/homicidal ideation.  He denies AH/VH.  He has a history of  Depression and inpatient [facility] treatment.  He has been seeing a community psychiatrist It is undetermined whether he takes medications prescribed.  Notes report he has not taken his Klonopin.  It is possible that family meeting with wife may clarify his adherence to treatment.  He has not had an ophthalmologic exam in some time.  He denies any feet lesions. He may benefit from med for anxiety, depression and neuropathic pain. RECOMMENDATIONS:  1. Consider trazodone for sleep 100 mg at bedtime 2. Consider Cymbalta 30 mg daily 3. Consider augmentation for treatment  resistant depression with  Seroquel 100 mg at bedtime [check for sedation in am].  4. Suggest family meeting with wife. 5. When medically stable, transfer to outpatient therapy and psychiatrist 6.  Pt has capacity and denies suicidal ideation; Consider discontinue sitter. 7. No further psychiatric needs are identified.  MD Psychiatrist signs off   [name] [name] 01/12/2012, 11:22 PM

## 2012-01-13 LAB — GLUCOSE, CAPILLARY
Glucose-Capillary: 218 mg/dL — ABNORMAL HIGH (ref 70–99)
Glucose-Capillary: 152 mg/dL — ABNORMAL HIGH (ref 70–99)

## 2012-01-13 LAB — BASIC METABOLIC PANEL
CO2: 19 mEq/L (ref 19–32)
GFR calc non Af Amer: >90 mL/min (ref >90)
Glucose, Bld: 326 mg/dL — ABNORMAL HIGH (ref 70–99)
Potassium: 4.3 mEq/L (ref 3.5–5.1)
Sodium: 135 mEq/L (ref 135–145)

## 2012-01-13 MED ORDER — DULOXETINE HCL 30 MG PO CPEP: 30.0000 mg | ORAL_CAPSULE | Freq: Every day | ORAL | Status: DC

## 2012-01-13 MED ORDER — TRAZODONE HCL 100 MG PO TABS: 100.0000 mg | ORAL_TABLET | Freq: Two times a day (BID) | ORAL | Status: DC | PRN

## 2012-01-13 MED ORDER — QUETIAPINE FUMARATE 50 MG PO TABS: 50.0000 mg | ORAL_TABLET | Freq: Every day | ORAL | Status: DC

## 2012-01-13 MED ORDER — OXYCODONE HCL 5 MG PO TABS: 5.0000 mg | ORAL_TABLET | ORAL | Status: AC | PRN

## 2012-01-13 NOTE — Progress Notes (Signed)
Clinical Social Work followed up with patient wife and patient.  Discussed psych assessment and medication recommendations.  Patient wife agreeable and patient is compliant with medicines, however CSW educated patient and wife if there are side effects to speak with MD first and [name] MD (mental health) before stopping a medication or decreasing or increasing the dosage.  Both agreeable.  Patient had an appointment on Feb 12 at [facility], but wife called and notified MH of patient admission into hospital, thus arranged for another appointment.  Will also refer patient to [facility] the [place] for medication management and counseling for depression.    Patient also has a [facility] appointment in March as well and Medicaid disability hearing at the end of this month to determine if patient is awarded disability.  Plan is for patient to return home, ?today and wife is agreeable to plan.  No further needs identified.  Please contact if needs arise.  [name] [name], MSW LCSW [phone]

## 2012-01-13 NOTE — Discharge Summary (Signed)
Internal Medicine Teaching [facility] Discharge Note  Name: [name] [name] MRN: [id number] DOB: 1956-05-13 56 y.o.  Date of Admission: 01/09/2012 10:31 PM Date of Discharge: 01/13/2012 Attending Physician: Anderson Malta, DO  Discharge Diagnosis: Principal Problem:  *DKA, type 2 Active Problems:  Altered mental status  Depression  Hyperkalemia  Patient Active Problem List  Diagnoses  . Hypercholesterolemia  . Altered mental status  . Overdose of benzodiazepine  . DM (diabetes mellitus), type 2, uncontrolled with complications  . Dysthymia  . Hypokalemia  . Hyponatremia  . Uncontrolled type 2 diabetes mellitus with hyperosmolar nonketotic hyperglycemia  . Depression  . GERD (gastroesophageal reflux disease)  . Right rotator cuff tear  . DKA, type 2  . Hyperkalemia  '  Discharge Medications: Medication List  As of 01/13/2012  3:59 PM   ASK your doctor about these medications         calcium carbonate 500 MG chewable tablet   Commonly known as: TUMS - dosed in mg elemental calcium   Chew 1 tablet by mouth 3 (three) times daily as needed. As needed for indigestion.      clonazePAM 2 MG tablet   Commonly known as: KLONOPIN   Take 2 mg by mouth 2 (two) times daily as needed. For anxiety      HYDROcodone-acetaminophen 5-500 MG per tablet   Commonly known as: VICODIN   Take 1 tablet by mouth every 6 (six) hours as needed. For pain      insulin aspart 100 UNIT/ML injection   Commonly known as: novoLOG   Inject 10 Units into the skin 3 (three) times daily with meals.      insulin glargine 100 UNIT/ML injection   Commonly known as: LANTUS   Inject 44 Units into the skin at bedtime.      pantoprazole 40 MG tablet   Commonly known as: PROTONIX   Take 40 mg by mouth daily.      pramipexole 1 MG tablet   Commonly known as: MIRAPEX   Take 1 mg by mouth at bedtime.      sertraline 50 MG tablet   Commonly known as: ZOLOFT   Take 100 mg by mouth daily.     zolpidem 10 MG tablet   Commonly known as: AMBIEN   Take 10 mg by mouth at bedtime as needed. For sleep.            Disposition and follow-up:   [name] was discharged from [facility] in Stable condition.    Follow-up Appointments: Follow-up Information    Call [facility]: Mental Health. (Please call to rescheule appointment)       Follow up with [facility] the [place]. (As needed for counseling and depression)    Contact information:   [phone]      Follow up with [facility]. (For additional medication resources and help due to self pay )    Contact information:   [phone]         Consultations: Treatment Team:  Mickeal Skinner, MD  Procedures Performed:  Dg Chest Portable 1 View  01/10/2012  *RADIOLOGY REPORT*  Clinical Data: Hyperglycemia.  Diabetes.  Hypertension.  PORTABLE CHEST - 1 VIEW  Comparison: 11/18/2011  Findings: Shallow inspiration.  Normal heart size and pulmonary vascularity.  No focal airspace consolidation in the lungs.  No blunting of costophrenic angles.  No pneumothorax.  Stable appearance since previous study.  IMPRESSION: No evidence of active pulmonary disease.  Original Report  Authenticated By: Marlon Pel, M.D.    Admission HPI: 56 yo AAM, presented from [place] (counsellor Lauree Chandler) with possible SI ideation to kill himself- By not taking insulin. Doesn't remember the whole conversation, but felt like "trhowing the insuln away" He admits to taking his meds properly. Has insomnia and lots of worries, and had not filled Klonopin. Has attempted suicide in the past, and was at [facility] over [place]- felt better after going there. Does take Zoloft for depression, and was just switched from Effexor and Abilify.  Mild sob, no cp, shouloder ache, no blurred vision, + polyuria, +polydipsia, no polyphagia, No cough cold fever, no dark stool or tarry stools. Was sick recently at [facility] visions or  voices heard.  He presented to the emergency room with blood sugars over 600 and subsequently was referred over for admission as he also had significant hyponatremia with a sodium of 122. he was placed on the glucometer sliding scale after being given 10 units of insulin.   Hospital Course by problem list: Principal Problem:  *DKA, type 2    DKA resolved quickly after aggressive hydration and initiation of IV insulin, transitioned back to home regimen of lantus and SSI. Much improved at this time - cont to titrate meds and follow to establish best regimen for home tx - SSI (resistant), plus novalog 3 units, plus lantus 20 units- will increase novalog and lantus BS 128-181    Hyperkalemia -> hypokalemia  Both resolved after repletion and correction of acidosis  Altered MS  Initally lethargic in setting of acute DKA. Psychiatry saw patient, issues with major depression. They have recommended a new regimen and cleared him for discharge, so suicidal ideation, expresses interest in getting his Dm regimen right at home. Appears to have returned to baseline MS, flat affect and reserved.    Discharge Vitals:  BP 137/80  Pulse 82  Temp(Src) 98.7 F (37.1 C) (Oral)  Resp 20  Ht 5\' 11"  (1.803 m)  Wt 82.101 kg (181 lb)  BMI 25.24 kg/m2  SpO2 96%  Discharge Labs:  Results for orders placed during the hospital encounter of 01/09/12 (from the past 24 hour(s))  GLUCOSE, CAPILLARY     Status: Abnormal   Collection Time   01/12/12  9:59 PM      Component Value Range   Glucose-Capillary 101 (*) 70 - 99 (mg/dL)  BASIC METABOLIC PANEL     Status: Abnormal   Collection Time   01/13/12  6:20 AM      Component Value Range   Sodium 135  135 - 145 (mEq/L)   Potassium 4.3  3.5 - 5.1 (mEq/L)   Chloride 105  96 - 112 (mEq/L)   CO2 19  19 - 32 (mEq/L)   Glucose, Bld 326 (*) 70 - 99 (mg/dL)   BUN 7  6 - 23 (mg/dL)   Creatinine, Ser 0.45  0.50 - 1.35 (mg/dL)   Calcium 8.7  8.4 - 40.9 (mg/dL)   GFR  calc non Af Amer >90  >90 (mL/min)   GFR calc Af Amer >90  >90 (mL/min)  GLUCOSE, CAPILLARY     Status: Abnormal   Collection Time   01/13/12 11:58 AM      Component Value Range   Glucose-Capillary 218 (*) 70 - 99 (mg/dL)   Comment 1 Documented in Chart     Comment 2 Notify RN      Signed: Anderson Malta 01/13/2012, 3:59 PM

## 2012-01-13 NOTE — Progress Notes (Signed)
   CARE MANAGEMENT NOTE 01/13/2012  Patient:  [name] [name],[name] [name]   Account Number:  [id number]  Date Initiated:  01/11/2012  Documentation initiated by:  Onnie Boer  Subjective/Objective Assessment:   PT WAS ADMITTED WITH DKA     Action/Plan:   PROGRESSION OF CARE AND DISCHARGE PLANNING  plan for outpt psych.   Anticipated DC Date:  01/13/2012   Anticipated DC Plan:  HOME/SELF CARE  In-house referral  Clinical Social Worker      DC Planning Services  CM consult      Choice offered to / List presented to:             Status of service:  Completed, signed off Medicare Important Message given?   (If response is "NO", the following Medicare IM given date fields will be blank) Date Medicare IM given:   Date Additional Medicare IM given:    Discharge Disposition:  HOME/SELF CARE  Per UR Regulation:  Reviewed for med. necessity/level of care/duration of stay  Comments:  PCP- Norberto Sorenson- [facility] (pt has upcoming appointment on Mar. 12 at 3:15)  01/13/12 16:43 Letha Cape RN, BSN [phone] patient for dc today, CSW following for outpt  psych.  01/12/12- 1200- Donn Pierini RN, BSN [phone] Spoke with pt at bedside per conversation pt states that he goes to [facility] for his PCP and medications. He reports that he is current in his certification and that he has an upcoming appointment next month (see above). Pt states that he either gets his medications from [facility] or [facility]. Pt is concerned about an appointment he has upcoming on this Thurs. Feb. 14 that he would like to be able to keep it is at 4:30. Pt is hopeful for discharge by then so that he can keep this appointment. CM to follow, no anticipated d/c needs.    UR COMPLETED.  Onnie Boer, RN, BSN 01/11/12 1409 PT WAS ADMITTED WITH DKA FROM HOME WITH SELF CARE.  WILL ASSIST PT WITH A HEALTH SERVE APPT AND ANY OTHER NEEDS.

## 2012-01-13 NOTE — Discharge Instructions (Signed)
Blood Sugar Monitoring, Adult GLUCOSE METERS FOR SELF-MONITORING OF BLOOD GLUCOSE  It is important to be able to correctly measure your blood sugar (glucose). You can use a blood glucose monitor (a small battery-operated device) to check your glucose level at any time. This allows you and your caregiver to monitor your diabetes and to determine how well your treatment plan is working. The process of monitoring your blood glucose with a glucose meter is called self-monitoring of blood glucose (SMBG). When people with diabetes control their blood sugar, they have better health. To test for glucose with a typical glucose meter, place the disposable strip in the meter. Then place a small sample of blood on the "test strip." The test strip is coated with chemicals that combine with glucose in blood. The meter measures how much glucose is present. The meter displays the glucose level as a number. Several new models can record and store a number of test results. Some models can connect to personal computers to store test results or print them out.  Newer meters are often easier to use than older models. Some meters allow you to get blood from places other than your fingertip. Some new models have automatic timing, error codes, signals, or barcode readers to help with proper adjustment (calibration). Some meters have a large display screen or spoken instructions for people with visual impairments.  INSTRUCTIONS FOR USING GLUCOSE METERS  Wash your hands with soap and warm water, or clean the area with alcohol. Dry your hands completely.   Prick the side of your fingertip with a lancet (a sharp-pointed tool used by hand).   Hold the hand down and gently milk the finger until a small drop of blood appears. Catch the blood with the test strip.   Follow the instructions for inserting the test strip and using the SMBG meter. Most meters require the meter to be turned on and the test strip to be inserted before  applying the blood sample.   Record the test result.   Read the instructions carefully for both the meter and the test strips that go with it. Meter instructions are found in the user manual. Keep this manual to help you solve any problems that may arise. Many meters use "error codes" when there is a problem with the meter, the test strip, or the blood sample on the strip. You will need the manual to understand these error codes and fix the problem.   New devices are available such as laser lancets and meters that can test blood taken from "alternative sites" of the body, other than fingertips. However, you should use standard fingertip testing if your glucose changes rapidly. Also, use standard testing if:   You have eaten, exercised, or taken insulin in the past 2 hours.   You think your glucose is low.   You tend to not feel symptoms of low blood glucose (hypoglycemia).   You are ill or under stress.   Clean the meter as directed by the manufacturer.   Test the meter for accuracy as directed by the manufacturer.   Take your meter with you to your caregiver's office. This way, you can test your glucose in front of your caregiver to make sure you are using the meter correctly. Your caregiver can also take a sample of blood to test using a routine lab method. If values on the glucose meter are close to the lab results, you and your caregiver will see that your meter is working well  and you are using good technique. Your caregiver will advise you about what to do if the results do not match.  FREQUENCY OF TESTING  Your caregiver will tell you how often you should check your blood glucose. This will depend on your type of diabetes, your current level of diabetes control, and your types of medicines. The following are general guidelines, but your care plan may be different. Record all your readings and the time of day you took them for review with your caregiver.   Diabetes type 1.   When you  are using insulin with good diabetic control (either multiple daily injections or via a pump), you should check your glucose 4 times a day.   If your diabetes is not well controlled, you may need to monitor more frequently, including before meals and 2 hours after meals, at bedtime, and occasionally between 2 a.m. and 3 a.m.   You should always check your glucose before a dose of insulin or before changing the rate on your insulin pump.   Diabetes type 2.   Guidelines for SMBG in diabetes type 2 are not as well defined.   If you are on insulin, follow the guidelines above.   If you are on medicines, but not insulin, and your glucose is not well controlled, you should test at least twice daily.   If you are not on insulin, and your diabetes is controlled with medicines or diet alone, you should test at least once daily, usually before breakfast.   A weekly profile will help your caregiver advise you on your care plan. The week before your visit, check your glucose before a meal and 2 hours after a meal at least daily. You may want to test before and after a different meal each day so you and your caregiver can tell how well controlled your blood sugars are throughout the course of a 24 hour period.   Gestational diabetes (diabetes during pregnancy).   Frequent testing is often necessary. Accurate timing is important.   If you are not on insulin, check your glucose 4 times a day. Check it before breakfast and 1 hour after the start of each meal.   If you are on insulin, check your glucose 6 times a day. Check it before each meal and 1 hour after the first bite of each meal.   General guidelines.   More frequent testing is required at the start of insulin treatment. Your caregiver will instruct you.   Test your glucose any time you suspect you have low blood sugar (hypoglycemia).   You should test more often when you change medicines, when you have unusual stress or illness, or in other  unusual circumstances.  OTHER THINGS TO KNOW ABOUT GLUCOSE METERS  Measurement Range. Most glucose meters are able to read glucose levels over a broad range of values from as low as 0 to as high as 600 mg/dL. If you get an extremely high or low reading from your meter, you should first confirm it with another reading. Report very high or very low readings to your caregiver.   Whole Blood Glucose versus Plasma Glucose. Some older home glucose meters measure glucose in your whole blood. In a lab or when using some newer home glucose meters, the glucose is measured in your plasma (one component of blood). The difference can be important. It is important for you and your caregiver to know whether your meter gives its results as "whole blood equivalent" or "plasma  equivalent."   Display of High and Low Glucose Values. Part of learning how to operate a meter is understanding what the meter results mean. Know how high and low glucose concentrations are displayed on your meter.   Factors that Affect Glucose Meter Performance. The accuracy of your test results depends on many factors and varies depending on the brand and type of meter. These factors include:   Low red blood cell count (anemia).   Substances in your blood (such as uric acid, vitamin C, and others).   Environmental factors (temperature, humidity, altitude).   Name-brand versus generic test strips.   Calibration. Make sure your meter is set up properly. It is a good idea to do a calibration test with a control solution recommended by the manufacturer of your meter whenever you begin using a fresh bottle of test strips. This will help verify the accuracy of your meter.   Improperly stored, expired, or defective test strips. Keep your strips in a dry place with the lid on.   Soiled meter.   Inadequate blood sample.  NEW TECHNOLOGIES FOR GLUCOSE TESTING Alternative site testing Some glucose meters allow testing blood from alternative  sites. These include the:  Upper arm.   Forearm.   Base of the thumb.   Thigh.  Sampling blood from alternative sites may be desirable. However, it may have some limitations. Blood in the fingertips show changes in glucose levels more quickly than blood in other parts of the body. This means that alternative site test results may be different from fingertip test results, not because of the meter's ability to test accurately, but because the actual glucose concentration can be different.  Continuous Glucose Monitoring Devices to measure your blood glucose continuously are available, and others are in development. These methods can be more expensive than self-monitoring with a glucose meter. However, it is uncertain how effective and reliable these devices are. Your caregiver will advise you if this approach makes sense for you. IF BLOOD SUGARS ARE CONTROLLED, PEOPLE WITH DIABETES REMAIN HEALTHIER.  SMBG is an important part of the treatment plan of patients with diabetes mellitus. Below are reasons for using SMBG:   It confirms that your glucose is at a specific, healthy level.   It detects hypoglycemia and severe hyperglycemia.   It allows you and your caregiver to make adjustments in response to changes in lifestyle for individuals requiring medicine.   It determines the need for starting insulin therapy in temporary diabetes that happens during pregnancy (gestational diabetes).  Document Released: 11/19/2003 Document Revised: 07/30/2011 Document Reviewed: 03/12/2011 [facility] Patient Information 2012 [place], [place].Diabetes and Sick Day Management Blood sugar (glucose) can be more difficult to control when you are sick. Colds, fever, flu, nausea, vomiting, and diarrhea are all examples of common illnesses that can cause problems for people with diabetes. Loss of body fluids (dehydration) from fever, vomiting, diarrhea, infection, and the stress of a sickness can all cause blood glucose  levels to rise. Because of this, it is very important to take your diabetes medicines and to eat some form of carbohydrate food when you are sick. Liquid or soft foods are often tolerated, and they help to replace fluids. HOME CARE INSTRUCTIONS These main guidelines are intended for managing a short-term (24 hours or less) sickness:  Take your usual dose of insulin or oral diabetes medicine. An exception would be if you take any form of metformin. If you cannot eat or drink, you can become dehydrated and should not  take this medicine.   Continue to take your insulin even if you are unable to eat solid foods or are vomiting. Your insulin dose may stay the same, or it may need to be increased when you are sick.   You will need to test your blood glucose more often, generally every 2 to 4 hours. If you have type 1 diabetes, test your urine for ketones every 4 hours. If you have type 2 diabetes, test your ketones as directed by your caregiver.   Eat some form of food that contains carbohydrates. The carbohydrates can be in solid or liquid form. You should eat 45 to 50 grams of carbohydrates every 3 to 4 hours.   Replace fluids if fever, vomiting, or diarrhea is present. Ask your caregiver for specific rehydration instructions.   Watch carefully for the signs of ketoacidosis if you have type 1 diabetes. Call your caregiver if any of the following symptoms are present, especially in children:   Moderate to large ketones in the urine along with a high blood glucose level.   Severe nausea.   Vomiting.   Diarrhea.   Abdominal pain.   Rapid breathing.   Drink extra liquids that do not contain sugar, such as water or sugar-free liquids, if your blood glucose is higher than 240 mg/dl.   Be careful with over-the-counter medicines. Read the labels. They may contain sugar or types of sugars that can raise your blood glucose.  Food Choices for Illness All of the food choices below contain around 15  grams of carbohydrates. Plan ahead and keep some of these foods around.    to  cup carbonated beverage containing sugar. Carbonated beverages will usually be better tolerated if they are opened and left at room temperature for a few minutes.    of a twin frozen ice pop.    cup sweetened gelatin dessert.    cup juice.    cup ice cream or frozen yogurt.    cup cooked cereal.    cup sherbet.   1 cup broth-based soup with noodles or rice, reconstituted with water.   1 cup cream soup.    cup regular custard.    cup regular pudding.   1 cup sports drink.   1 cup plain yogurt.   1 slice toast.   6 squares saltine crackers.   5 vanilla wafers.  SEEK MEDICAL CARE IF:   You are sick and see no improvement in 6 to 8 hours.   You are unable to eat regular foods for more than 24 hours.   You have blood glucose readings over 240 mg/dl, 2 times in a row.   Your blood glucose is less than 70 mg/dl, 2 times in a row.   You are not sure what to do to take care of yourself.   You vomit more than once in 4 to 6 hours.   You have moderate or large ketones in your urine.   You have a fever.   Your symptoms are getting worse even though you are following your caregiver's instructions.  Document Released: 11/19/2003 Document Revised: 07/29/2011 Document Reviewed: 08/14/2009 [facility] Patient Information 2012 [place], [place].

## 2012-02-28 ENCOUNTER — Emergency Department (HOSPITAL_COMMUNITY): Payer: Medicaid Other

## 2012-02-28 ENCOUNTER — Inpatient Hospital Stay (HOSPITAL_COMMUNITY)
Admission: EM | Admit: 2012-02-28 | Discharge: 2012-03-01 | DRG: 074 | Disposition: A | Discharge status: Home / Self Care | Payer: Medicaid Other | Attending: Internal Medicine | Admitting: Internal Medicine

## 2012-02-28 ENCOUNTER — Other Ambulatory Visit (HOSPITAL_COMMUNITY): Payer: Self-pay | Admitting: Pharmacy Technician

## 2012-02-28 ENCOUNTER — Other Ambulatory Visit: Payer: Self-pay

## 2012-02-28 ENCOUNTER — Encounter (HOSPITAL_COMMUNITY): Payer: Self-pay

## 2012-02-28 DIAGNOSIS — E860 Dehydration: Secondary | ICD-10-CM

## 2012-02-28 DIAGNOSIS — R112 Nausea with vomiting, unspecified: Secondary | ICD-10-CM | POA: Diagnosis present

## 2012-02-28 DIAGNOSIS — X58XXXA Exposure to other specified factors, initial encounter: Secondary | ICD-10-CM | POA: Diagnosis present

## 2012-02-28 DIAGNOSIS — J309 Allergic rhinitis, unspecified: Secondary | ICD-10-CM | POA: Diagnosis present

## 2012-02-28 DIAGNOSIS — F3289 Other specified depressive episodes: Secondary | ICD-10-CM | POA: Diagnosis present

## 2012-02-28 DIAGNOSIS — E1142 Type 2 diabetes mellitus with diabetic polyneuropathy: Secondary | ICD-10-CM | POA: Diagnosis present

## 2012-02-28 DIAGNOSIS — B349 Viral infection, unspecified: Secondary | ICD-10-CM

## 2012-02-28 DIAGNOSIS — Z794 Long term (current) use of insulin: Secondary | ICD-10-CM

## 2012-02-28 DIAGNOSIS — R197 Diarrhea, unspecified: Secondary | ICD-10-CM | POA: Diagnosis present

## 2012-02-28 DIAGNOSIS — K219 Gastro-esophageal reflux disease without esophagitis: Secondary | ICD-10-CM | POA: Diagnosis present

## 2012-02-28 DIAGNOSIS — M25519 Pain in unspecified shoulder: Secondary | ICD-10-CM | POA: Diagnosis present

## 2012-02-28 DIAGNOSIS — S43429A Sprain of unspecified rotator cuff capsule, initial encounter: Secondary | ICD-10-CM | POA: Diagnosis present

## 2012-02-28 DIAGNOSIS — E1149 Type 2 diabetes mellitus with other diabetic neurological complication: Principal | ICD-10-CM | POA: Diagnosis present

## 2012-02-28 DIAGNOSIS — I10 Essential (primary) hypertension: Secondary | ICD-10-CM | POA: Diagnosis present

## 2012-02-28 DIAGNOSIS — E7800 Pure hypercholesterolemia, unspecified: Secondary | ICD-10-CM | POA: Diagnosis present

## 2012-02-28 DIAGNOSIS — E1165 Type 2 diabetes mellitus with hyperglycemia: Secondary | ICD-10-CM | POA: Diagnosis present

## 2012-02-28 DIAGNOSIS — M75101 Unspecified rotator cuff tear or rupture of right shoulder, not specified as traumatic: Secondary | ICD-10-CM | POA: Diagnosis present

## 2012-02-28 DIAGNOSIS — F329 Major depressive disorder, single episode, unspecified: Secondary | ICD-10-CM | POA: Diagnosis present

## 2012-02-28 DIAGNOSIS — F32A Depression, unspecified: Secondary | ICD-10-CM | POA: Diagnosis present

## 2012-02-28 DIAGNOSIS — R739 Hyperglycemia, unspecified: Secondary | ICD-10-CM

## 2012-02-28 DIAGNOSIS — E875 Hyperkalemia: Secondary | ICD-10-CM | POA: Diagnosis present

## 2012-02-28 DIAGNOSIS — E118 Type 2 diabetes mellitus with unspecified complications: Secondary | ICD-10-CM

## 2012-02-28 DIAGNOSIS — B9789 Other viral agents as the cause of diseases classified elsewhere: Secondary | ICD-10-CM | POA: Diagnosis present

## 2012-02-28 DIAGNOSIS — E876 Hypokalemia: Secondary | ICD-10-CM | POA: Diagnosis not present

## 2012-02-28 DIAGNOSIS — G629 Polyneuropathy, unspecified: Secondary | ICD-10-CM

## 2012-02-28 LAB — BLOOD GAS, VENOUS
Acid-base deficit: 3.4 mmol/L — ABNORMAL HIGH (ref 0.0–2.0)
Bicarbonate: 20.3 mEq/L (ref 20.0–24.0)
FIO2: 0.21 %
O2 Saturation: 96 %
TCO2: 17.8 mmol/L (ref 0–100)
pO2, Ven: 80.5 mmHg — ABNORMAL HIGH (ref 30.0–45.0)

## 2012-02-28 LAB — BASIC METABOLIC PANEL
Chloride: 100 mEq/L (ref 96–112)
GFR calc Af Amer: >90 mL/min (ref >90)
GFR calc non Af Amer: >90 mL/min (ref >90)
Potassium: 4.6 mEq/L (ref 3.5–5.1)
Sodium: 132 mEq/L — ABNORMAL LOW (ref 135–145)

## 2012-02-28 LAB — URINALYSIS, ROUTINE W REFLEX MICROSCOPIC
Bilirubin Urine: NEGATIVE
Nitrite: NEGATIVE
Specific Gravity, Urine: 1.032 — ABNORMAL HIGH (ref 1.005–1.030)
Urobilinogen, UA: 0.2 mg/dL (ref 0.0–1.0)

## 2012-02-28 LAB — GLUCOSE, CAPILLARY
Glucose-Capillary: 420 mg/dL — ABNORMAL HIGH (ref 70–99)
Glucose-Capillary: 190 mg/dL — ABNORMAL HIGH (ref 70–99)

## 2012-02-28 LAB — DIFFERENTIAL
Basophils Absolute: 0 10*3/uL (ref 0.0–0.1)
Basophils Relative: 1 % (ref 0–1)
Monocytes Absolute: 0.3 10*3/uL (ref 0.1–1.0)
Neutro Abs: 3 10*3/uL (ref 1.7–7.7)
Neutrophils Relative %: 57 % (ref 43–77)

## 2012-02-28 LAB — CBC
MCHC: 35.1 g/dL (ref 30.0–36.0)
RDW: 12.9 % (ref 11.5–15.5)

## 2012-02-28 LAB — URINE MICROSCOPIC-ADD ON: Urine-Other: NONE SEEN

## 2012-02-28 MED ORDER — ALUM & MAG HYDROXIDE-SIMETH 200-200-20 MG/5ML PO SUSP: 30.0000 mL | Freq: Four times a day (QID) | ORAL | Status: DC | PRN

## 2012-02-28 MED ORDER — POTASSIUM CHLORIDE 10 MEQ/100ML IV SOLN
10.0000 meq | INTRAVENOUS | Status: AC
  Administered 2012-02-29 (×2): 10 meq via INTRAVENOUS
  Filled 2012-02-28: qty 200

## 2012-02-28 MED ORDER — ONDANSETRON HCL 4 MG/2ML IJ SOLN: 4.0000 mg | Freq: Four times a day (QID) | INTRAMUSCULAR | Status: DC | PRN

## 2012-02-28 MED ORDER — SODIUM CHLORIDE 0.9 % IV SOLN
1000.0000 mL | INTRAVENOUS | Status: DC
  Administered 2012-02-28: 1000 mL via INTRAVENOUS

## 2012-02-28 MED ORDER — DEXTROSE 50 % IV SOLN
25.0000 mL | INTRAVENOUS | Status: DC | PRN
  Filled 2012-02-28: qty 50

## 2012-02-28 MED ORDER — ASPIRIN EC 81 MG PO TBEC
81.0000 mg | DELAYED_RELEASE_TABLET | Freq: Every day | ORAL | Status: DC
  Administered 2012-02-29 – 2012-03-01 (×2): 81 mg via ORAL
  Filled 2012-02-28 (×2): qty 1

## 2012-02-28 MED ORDER — DULOXETINE HCL 30 MG PO CPEP
30.0000 mg | ORAL_CAPSULE | Freq: Every day | ORAL | Status: DC
  Administered 2012-02-29 – 2012-03-01 (×2): 30 mg via ORAL
  Filled 2012-02-28 (×2): qty 1

## 2012-02-28 MED ORDER — SODIUM CHLORIDE 0.9 % IV SOLN
1000.0000 mL | Freq: Once | INTRAVENOUS | Status: AC
  Administered 2012-02-28: 1000 mL via INTRAVENOUS

## 2012-02-28 MED ORDER — PANTOPRAZOLE SODIUM 40 MG PO TBEC
40.0000 mg | DELAYED_RELEASE_TABLET | Freq: Every day | ORAL | Status: DC
  Administered 2012-02-29 – 2012-03-01 (×2): 40 mg via ORAL
  Filled 2012-02-28 (×2): qty 1

## 2012-02-28 MED ORDER — CLONAZEPAM 1 MG PO TABS: 2.0000 mg | ORAL_TABLET | Freq: Two times a day (BID) | ORAL | Status: DC | PRN

## 2012-02-28 MED ORDER — ONDANSETRON HCL 4 MG PO TABS: 4.0000 mg | ORAL_TABLET | Freq: Four times a day (QID) | ORAL | Status: DC | PRN

## 2012-02-28 MED ORDER — SODIUM CHLORIDE 0.9 % IV SOLN: INTRAVENOUS | Status: DC

## 2012-02-28 MED ORDER — SODIUM CHLORIDE 0.9 % IV SOLN
INTRAVENOUS | Status: AC
  Administered 2012-02-29: 1000 mL via INTRAVENOUS

## 2012-02-28 MED ORDER — SERTRALINE HCL 50 MG PO TABS
150.0000 mg | ORAL_TABLET | Freq: Every day | ORAL | Status: DC
  Administered 2012-02-29 – 2012-03-01 (×2): 150 mg via ORAL
  Filled 2012-02-28 (×2): qty 1

## 2012-02-28 MED ORDER — SODIUM CHLORIDE 0.9 % IV SOLN
INTRAVENOUS | Status: DC
  Filled 2012-02-28: qty 1

## 2012-02-28 MED ORDER — METFORMIN HCL 500 MG PO TABS
1000.0000 mg | ORAL_TABLET | Freq: Two times a day (BID) | ORAL | Status: DC
  Administered 2012-02-29 – 2012-03-01 (×3): 1000 mg via ORAL
  Filled 2012-02-28 (×4): qty 2

## 2012-02-28 MED ORDER — DEXTROSE-NACL 5-0.45 % IV SOLN
INTRAVENOUS | Status: DC
  Administered 2012-02-29: 125 mL via INTRAVENOUS

## 2012-02-28 MED ORDER — SODIUM CHLORIDE 0.9 % IV SOLN
INTRAVENOUS | Status: DC
  Administered 2012-02-28: 4.8 [IU]/h via INTRAVENOUS
  Filled 2012-02-28: qty 1

## 2012-02-28 NOTE — ED Notes (Signed)
Pt arrived POV, pt appears slightly lethargic, BS high, per wife pt has had n/v/d x 24 hours 3-4 each. Pt awake, IV est, labs drawn. NS infusing.

## 2012-02-28 NOTE — ED Notes (Signed)
EDP Brooke Dare present to evaluate this pt- Dowd RN present to start IV

## 2012-02-28 NOTE — ED Provider Notes (Signed)
History     CSN: [id number]  Arrival date & time 02/28/12  1602   First MD Initiated Contact with Patient 02/28/12 1645      Chief Complaint  Patient presents with  . Hyperglycemia  . Fatigue  . Emesis  . Sore Throat  . Headache  . Shoulder Pain    (Consider location/radiation/quality/duration/timing/severity/associated sxs/prior treatment) HPI Comments: Patient presents with generalized malaise and fatigue over the past several days. Has a history of diabetes. Has contracted a viral-type illness. Cough, congestion, rhinorrhea, sore throat. Blood sugars have been fluctuating over the past 3 days. Has most recently been greater than 500. His had increasing lethargy. Denies chest pain, shortness of breath. Has had nausea and vomiting. Generalized abdominal pain. Has a history of DKA in the past. Takes Lantus and NovoLog.  The history is provided by the spouse and the patient. No language interpreter was used.    Past Medical History  Diagnosis Date  . Diabetes mellitus   . Hypercholesterolemia   . GERD (gastroesophageal reflux disease)   . Hypertension   . Hypercholesteremia     hx of  . Hemorrhoids     hx of  . Peripheral neuropathy   . Depression     Past Surgical History  Procedure Date  . Tonsillectomy     Family History  Problem Relation Age of Onset  . Hypotension Mother     History  Substance Use Topics  . Smoking status: Never Smoker   . Smokeless tobacco: Not on file  . Alcohol Use: No      Review of Systems  Constitutional: Positive for activity change, appetite change and fatigue. Negative for fever.  HENT: Positive for congestion, sore throat and rhinorrhea. Negative for neck pain and neck stiffness.   Respiratory: Positive for cough. Negative for shortness of breath.   Cardiovascular: Negative for chest pain and palpitations.  Gastrointestinal: Positive for nausea, vomiting and abdominal pain.  Genitourinary: Negative for dysuria, urgency,  frequency and flank pain.  Musculoskeletal: Negative for myalgias, back pain and arthralgias.  Neurological: Positive for weakness (generalized). Negative for dizziness, numbness and headaches.  All other systems reviewed and are negative.    Allergies  Review of patient's allergies indicates no known allergies.  Home Medications   Current Outpatient Rx  Name Route Sig Dispense Refill  . CALCIUM CARBONATE ANTACID 500 MG PO CHEW Oral Chew 1 tablet by mouth 3 (three) times daily as needed. As needed for indigestion.    Marland Kitchen CLONAZEPAM 2 MG PO TABS Oral Take 2 mg by mouth 2 (two) times daily as needed. For anxiety    . DULOXETINE HCL 30 MG PO CPEP Oral Take 1 capsule (30 mg total) by mouth daily. 30 capsule 11  . INSULIN GLARGINE 100 UNIT/ML Nichols SOLN Subcutaneous Inject 55 Units into the skin 2 (two) times daily.     Marland Kitchen PANTOPRAZOLE SODIUM 40 MG PO TBEC Oral Take 40 mg by mouth daily.    Marland Kitchen PSEUDOEPH-DOXYLAMINE-DM-APAP 60-7.04-28-999 MG/30ML PO LIQD Oral Take 30 mLs by mouth every 6 (six) hours as needed. For night time symptom relief    . ASPIRIN EC 81 MG PO TBEC Oral Take 81 mg by mouth daily.    Marland Kitchen METFORMIN HCL 1000 MG PO TABS Oral Take 1,000 mg by mouth 2 (two) times daily with a meal.    . PRAVASTATIN SODIUM 20 MG PO TABS Oral Take 20 mg by mouth daily.    . QUETIAPINE FUMARATE 50 MG PO  TABS Oral Take 1 tablet (50 mg total) by mouth at bedtime. 30 tablet 0  . SERTRALINE HCL 100 MG PO TABS Oral Take 150 mg by mouth daily.    . TRAZODONE HCL 100 MG PO TABS Oral Take 1 tablet (100 mg total) by mouth 3 times/day as needed-between meals & bedtime for sleep. 30 tablet 0    BP 117/76  Pulse 100  Temp(Src) 97.1 F (36.2 C) (Oral)  Resp 17  SpO2 97%  Physical Exam  Nursing note and vitals reviewed. Constitutional: He is oriented to person, place, and time. He appears well-developed and well-nourished.       Lethargic on examination  HENT:  Head: Normocephalic and atraumatic.       Dry MM    Eyes: Conjunctivae and EOM are normal. Pupils are equal, round, and reactive to light.  Neck: Normal range of motion. Neck supple.  Cardiovascular: Regular rhythm, normal heart sounds and intact distal pulses.  Exam reveals no gallop and no friction rub.   No murmur heard.      Tachycardic rate  Pulmonary/Chest: Effort normal and breath sounds normal. No respiratory distress. He exhibits no tenderness.  Abdominal: Soft. Bowel sounds are normal. There is no tenderness. There is no rebound and no guarding.  Musculoskeletal: Normal range of motion. He exhibits no edema and no tenderness.  Neurological: He is alert and oriented to person, place, and time.  Skin: Skin is warm and dry. No rash noted.    ED Course  Procedures (including critical care time)   Date: 02/28/2012  Rate: 106  Rhythm: sinus tachycardia  QRS Axis: normal  Intervals: normal  ST/T Wave abnormalities: normal  Conduction Disutrbances:none  Narrative Interpretation:   Old EKG Reviewed: unchanged  Labs Reviewed  GLUCOSE, CAPILLARY - Abnormal; Notable for the following:    Glucose-Capillary >600 (*)    All other components within normal limits  URINALYSIS, ROUTINE W REFLEX MICROSCOPIC - Abnormal; Notable for the following:    Specific Gravity, Urine 1.032 (*)    Glucose, UA >1000 (*)    Ketones, ur TRACE (*)    All other components within normal limits  POCT I-STAT, CHEM 8 - Abnormal; Notable for the following:    Sodium 130 (*)    Potassium 7.0 (*)    BUN 25 (*)    Glucose, Bld 669 (*)    All other components within normal limits  BLOOD GAS, VENOUS - Abnormal; Notable for the following:    pH, Ven 7.392 (*)    pCO2, Ven 34.1 (*)    pO2, Ven 80.5 (*)    Acid-base deficit 3.4 (*)    All other components within normal limits  GLUCOSE, CAPILLARY - Abnormal; Notable for the following:    Glucose-Capillary 539 (*)    All other components within normal limits  CBC  DIFFERENTIAL  TROPONIN I  URINE  MICROSCOPIC-ADD ON  BLOOD GAS, VENOUS  BASIC METABOLIC PANEL   Dg Chest Portable 1 View  02/28/2012  *RADIOLOGY REPORT*  Clinical Data: Fatigue and hyperglycemia.  Sore throat and headache.  PORTABLE CHEST - 1 VIEW  Comparison: 01/10/2012  Findings: The heart size and mediastinal contours are within normal limits.  Both lungs are clear.  The visualized skeletal structures are unremarkable.  IMPRESSION: Negative exam.  Original Report Authenticated By: Rosealee Albee, M.D.     1. Hyperglycemia   2. Dehydration   3. Viral illness       MDM  Profound hyperglycemia  without significant evidence of DKA. Was placed on an insulin drip. Potassium on i-STAT chem 8 was found to be 7. There is no EKG changes. I feel this is elevated likely secondary to his overall status of hyperglycemia. This will be treated with the insulin drip. The patient to administer Kayexalate as this will worsen his overall potassium state. He received 2 L of IV fluids. He was then placed on a maintenance of 125 hour period I feel he is profoundly dehydrated. He'll be inciting event is likely a viral illness. He states he has been compliant with his medications. Discussed with the [facility] hospitalist who accepted the patient for admission        Dayton Bailiff, MD 02/28/12 1929

## 2012-02-28 NOTE — ED Notes (Signed)
Insulin gtt started pt appears much more awake at this time, c/o pain to R shoulder. Labs had to be drawn do to reports of hemolyzation by lab.

## 2012-02-28 NOTE — ED Notes (Signed)
Pt present lethargic and weakness since Thursday- Pt reports complaint with CBG- triage CBG critical high

## 2012-02-28 NOTE — H&P (Signed)
PCP:   Norberto Sorenson, MD, MD   Chief Complaint:  Hyperglycemia  HPI: This is a 56 year old male who presents because of hyperglycemia. He states his wife was ill with a cold, which he caught from her. He developed a cough and sneezing. He had some nausea and vomiting, lots of diarrhea. No evidence of blood. Most of these symptoms have improved. He had some nonspecific generalized abdominal discomfort. His energy is very low, he's been lightheaded and felt presyncopal. Additionally, he developed hyperglycemia. His sugars have run from 300, to today were his monitor read too high to read. He came to the ER. The patient does appear ill with a flat affect. I suspect the patient suffers from chronic depression. History provided by the patient was alert and oriented.  Review of Systems: Positives bolded   anorexia, fever, weight loss,, vision loss, decreased hearing, hoarseness, chest pain, syncope, dyspnea on exertion, peripheral edema, balance deficits, hemoptysis, abdominal discomfort, melena, hematochezia, severe indigestion/heartburn, hematuria, incontinence, genital sores, muscle weakness, suspicious skin lesions, transient blindness, difficulty walking, depression, unusual weight change, abnormal bleeding, enlarged lymph nodes, angioedema, and breast masses.  Past Medical History: Past Medical History  Diagnosis Date  . Diabetes mellitus   . Hypercholesterolemia   . GERD (gastroesophageal reflux disease)   . Hypertension   . Hypercholesteremia     hx of  . Hemorrhoids     hx of  . Peripheral neuropathy   . Depression    Past Surgical History  Procedure Date  . Tonsillectomy     Medications: Prior to Admission medications   Medication Sig Start Date End Date Taking? Authorizing Provider  calcium carbonate (TUMS - DOSED IN MG ELEMENTAL CALCIUM) 500 MG chewable tablet Chew 1 tablet by mouth 3 (three) times daily as needed. As needed for indigestion.   Yes Historical Provider, MD    clonazePAM (KLONOPIN) 2 MG tablet Take 2 mg by mouth 2 (two) times daily as needed. For anxiety   Yes Historical Provider, MD  DULoxetine (CYMBALTA) 30 MG capsule Take 1 capsule (30 mg total) by mouth daily. 01/13/12 01/12/13 Yes Edsel Petrin, DO  insulin glargine (LANTUS) 100 UNIT/ML injection Inject 55 Units into the skin 2 (two) times daily.  11/27/11 11/26/12 Yes [name], NP  pantoprazole (PROTONIX) 40 MG tablet Take 40 mg by mouth daily. 12/26/11  Yes Srikar Cherlynn Kaiser, MD  Pseudoeph-Doxylamine-DM-APAP (NYQUIL) 60-7.04-28-999 MG/30ML LIQD Take 30 mLs by mouth every 6 (six) hours as needed. For night time symptom relief   Yes Historical Provider, MD  aspirin EC 81 MG tablet Take 81 mg by mouth daily.    Historical Provider, MD  metFORMIN (GLUCOPHAGE) 1000 MG tablet Take 1,000 mg by mouth 2 (two) times daily with a meal.    Historical Provider, MD  pravastatin (PRAVACHOL) 20 MG tablet Take 20 mg by mouth daily.    Historical Provider, MD  QUEtiapine (SEROQUEL) 50 MG tablet Take 1 tablet (50 mg total) by mouth at bedtime. 01/13/12 02/12/12  Edsel Petrin, DO  sertraline (ZOLOFT) 100 MG tablet Take 150 mg by mouth daily.    Historical Provider, MD  traZODone (DESYREL) 100 MG tablet Take 1 tablet (100 mg total) by mouth 3 times/day as needed-between meals & bedtime for sleep. 01/13/12 02/12/12  Edsel Petrin, DO    Allergies:  No Known Allergies  Social History:  reports that he has never smoked. He does not have any smokeless tobacco history on file. He reports that he does  not drink alcohol or use illicit drugs.  Family History: Family History  Problem Relation Age of Onset  . Hypertension Mother     Physical Exam: Filed Vitals:   02/28/12 1623 02/28/12 1832 02/28/12 1941  BP: 117/67 117/76 120/77  Pulse: 102 100 104  Temp: 97.1 F (36.2 C)  98.6 F (37 C)  TempSrc: Oral  Oral  Resp: 17  18  SpO2: 98% 97% 97%    General:  Alert and oriented times three, well  developed and nourished, no acute distress Eyes: PERRLA, pink conjunctiva, no scleral icterus ENT: Dry oral mucosa, neck supple, no thyromegaly Lungs: clear to ascultation, no wheeze, no crackles, no use of accessory muscles Cardiovascular: regular rate and rhythm, no regurgitation, no gallops, no murmurs. No carotid bruits, no JVD Abdomen: soft, positive BS, non-tender, non-distended, no organomegaly, not an acute abdomen GU: not examined Neuro: CN II - XII grossly intact, sensation intact Musculoskeletal: strength 5/5 all extremities, no clubbing, cyanosis or edema, right shoulder pain-chronic, right thumb tenderness - (present approximately 2 months) Skin: no rash, no subcutaneous crepitation, no decubitus Psych: Flat affect   Labs on Admission:   [facility] 02/28/12 1830 02/28/12 1736  NA 132* 130*  K 4.6 7.0*  CL 100 102  CO2 19 --  GLUCOSE 551* 669*  BUN 16 25*  CREATININE 0.91 1.10  CALCIUM 8.5 --  MG -- --  PHOS -- --   No results found for this basename: AST:2,ALT:2,ALKPHOS:2,BILITOT:2,PROT:2,ALBUMIN:2 in the last 72 hours No results found for this basename: LIPASE:2,AMYLASE:2 in the last 72 hours  Basename 02/28/12 1736 02/28/12 1719  WBC -- 5.3  NEUTROABS -- 3.0  HGB 15.6 14.9  HCT 46.0 42.4  MCV -- 84.6  PLT -- 310    Basename 02/28/12 1820  CKTOTAL --  CKMB --  CKMBINDEX --  TROPONINI <0.30   No components found with this basename: POCBNP:3 No results found for this basename: DDIMER:2 in the last 72 hours No results found for this basename: HGBA1C:2 in the last 72 hours No results found for this basename: CHOL:2,HDL:2,LDLCALC:2,TRIG:2,CHOLHDL:2,LDLDIRECT:2 in the last 72 hours No results found for this basename: TSH,T4TOTAL,FREET3,T3FREE,THYROIDAB in the last 72 hours No results found for this basename: VITAMINB12:2,FOLATE:2,FERRITIN:2,TIBC:2,IRON:2,RETICCTPCT:2 in the last 72 hours  Micro Results: No results found for this or any previous visit (from  the past 240 hour(s)). Results for [name] [name], [name] [name] (MRN [id number]) as of 02/28/2012 20:00  Ref. Range 02/28/2012 17:53  Color, Urine Latest Range: YELLOW  YELLOW  APPearance Latest Range: CLEAR  CLEAR  Specific Gravity, Urine Latest Range: 1.005-1.030  1.032 (H)  pH Latest Range: 5.0-8.0  5.0  Glucose, UA Latest Range: NEGATIVE mg/dL >4098 (A)  Bilirubin Urine Latest Range: NEGATIVE  NEGATIVE  Ketones, ur Latest Range: NEGATIVE mg/dL TRACE (A)  Protein Latest Range: NEGATIVE mg/dL NEGATIVE  Urobilinogen, UA Latest Range: 0.0-1.0 mg/dL 0.2  Nitrite Latest Range: NEGATIVE  NEGATIVE  Leukocytes, UA Latest Range: NEGATIVE  NEGATIVE  Hgb urine dipstick Latest Range: NEGATIVE  NEGATIVE  Urine-Other No range found NO FORMED ELEMENTS SEEN ON URINE MICROSCOPIC EXAMINATION    Radiological Exams on Admission: Dg Chest Portable 1 View  02/28/2012  *RADIOLOGY REPORT*  Clinical Data: Fatigue and hyperglycemia.  Sore throat and headache.  PORTABLE CHEST - 1 VIEW  Comparison: 01/10/2012  Findings: The heart size and mediastinal contours are within normal limits.  Both lungs are clear.  The visualized skeletal structures are unremarkable.  IMPRESSION: Negative exam.  Original Report Authenticated By:  Rosealee Albee, M.D.    Assessment/Plan Present on Admission:  .Hyperglycemia Admit to step down Insulin drip Will transition to home Lantus dose once glucose controlled Hyperglycemia most likely due to infection -viral  .Hyperkalemia Likely a false reading from an i-STAT machine, repeat BMP has normal potassium levels  .DM (diabetes mellitus), type 2, uncontrolled with complications .Right rotator cuff tear .Depression Resume home medications  Full code DVT prophylaxis Team 5/Dr. Lauretta Chester, Kloe Oates 02/28/2012, 8:12 PM

## 2012-02-28 NOTE — ED Notes (Signed)
Awaiting SD bed assignment, pt c/o R shoulder pain. Will review inpt orders for pain management

## 2012-02-29 LAB — BASIC METABOLIC PANEL
BUN: 12 mg/dL (ref 6–23)
CO2: 22 mEq/L (ref 19–32)
Calcium: 8.8 mg/dL (ref 8.4–10.5)
Chloride: 106 mEq/L (ref 96–112)
Chloride: 106 mEq/L (ref 96–112)
Creatinine, Ser: 0.79 mg/dL (ref 0.50–1.35)
GFR calc Af Amer: >90 mL/min (ref >90)
GFR calc Af Amer: >90 mL/min (ref >90)
GFR calc Af Amer: >90 mL/min (ref >90)
GFR calc non Af Amer: >90 mL/min (ref >90)
GFR calc non Af Amer: >90 mL/min (ref >90)
Glucose, Bld: 152 mg/dL — ABNORMAL HIGH (ref 70–99)
Potassium: 3.7 mEq/L (ref 3.5–5.1)
Potassium: 4 mEq/L (ref 3.5–5.1)
Potassium: 4.2 mEq/L (ref 3.5–5.1)
Sodium: 138 mEq/L (ref 135–145)
Sodium: 136 mEq/L (ref 135–145)
Sodium: 137 mEq/L (ref 135–145)

## 2012-02-29 LAB — CBC
HCT: 37.3 % — ABNORMAL LOW (ref 39.0–52.0)
HCT: 40.7 % (ref 39.0–52.0)
Hemoglobin: 12.9 g/dL — ABNORMAL LOW (ref 13.0–17.0)
MCH: 29.5 pg (ref 26.0–34.0)
MCHC: 34.6 g/dL (ref 30.0–36.0)
MCV: 84.4 fL (ref 78.0–100.0)
Platelets: 226 10*3/uL (ref 150–400)
RBC: 4.37 MIL/uL (ref 4.22–5.81)
RBC: 4.82 MIL/uL (ref 4.22–5.81)
RDW: 12.8 % (ref 11.5–15.5)
WBC: 5.2 10*3/uL (ref 4.0–10.5)

## 2012-02-29 LAB — GLUCOSE, CAPILLARY
Glucose-Capillary: 131 mg/dL — ABNORMAL HIGH (ref 70–99)
Glucose-Capillary: 162 mg/dL — ABNORMAL HIGH (ref 70–99)
Glucose-Capillary: 226 mg/dL — ABNORMAL HIGH (ref 70–99)
Glucose-Capillary: 278 mg/dL — ABNORMAL HIGH (ref 70–99)
Glucose-Capillary: 291 mg/dL — ABNORMAL HIGH (ref 70–99)
Glucose-Capillary: 91 mg/dL (ref 70–99)
Glucose-Capillary: 159 mg/dL — ABNORMAL HIGH (ref 70–99)

## 2012-02-29 LAB — POCT I-STAT, CHEM 8
Glucose, Bld: 669 mg/dL (ref 70–99)
HCT: 46 % (ref 39.0–52.0)
Hemoglobin: 15.6 g/dL (ref 13.0–17.0)
Potassium: 7 mEq/L (ref 3.5–5.1)
TCO2: 23 mmol/L (ref 0–100)

## 2012-02-29 LAB — LIPID PANEL
Total CHOL/HDL Ratio: 4.8 RATIO
VLDL: 24 mg/dL (ref 0–40)

## 2012-02-29 LAB — TSH: TSH: 0.907 u[IU]/mL (ref 0.350–4.500)

## 2012-02-29 MED ORDER — SIMVASTATIN 20 MG PO TABS
20.0000 mg | ORAL_TABLET | Freq: Every day | ORAL | Status: DC
  Administered 2012-02-29: 20 mg via ORAL
  Filled 2012-02-29 (×2): qty 1

## 2012-02-29 MED ORDER — INSULIN GLARGINE 100 UNIT/ML ~~LOC~~ SOLN: 20.0000 [IU] | Freq: Every day | SUBCUTANEOUS | Status: DC

## 2012-02-29 MED ORDER — INSULIN GLARGINE 100 UNIT/ML ~~LOC~~ SOLN
35.0000 [IU] | Freq: Two times a day (BID) | SUBCUTANEOUS | Status: DC
  Administered 2012-02-29 – 2012-03-01 (×2): 35 [IU] via SUBCUTANEOUS

## 2012-02-29 MED ORDER — ENOXAPARIN SODIUM 40 MG/0.4ML ~~LOC~~ SOLN
40.0000 mg | SUBCUTANEOUS | Status: DC
  Administered 2012-02-29 – 2012-03-01 (×2): 40 mg via SUBCUTANEOUS
  Filled 2012-02-29 (×2): qty 0.4

## 2012-02-29 MED ORDER — LORATADINE 10 MG PO TABS
10.0000 mg | ORAL_TABLET | Freq: Every day | ORAL | Status: DC
  Administered 2012-02-29 – 2012-03-01 (×2): 10 mg via ORAL
  Filled 2012-02-29 (×2): qty 1

## 2012-02-29 MED ORDER — AZELASTINE HCL 0.1 % NA SOLN
1.0000 | Freq: Two times a day (BID) | NASAL | Status: DC
  Administered 2012-02-29 – 2012-03-01 (×3): 1 via NASAL
  Filled 2012-02-29: qty 30

## 2012-02-29 MED ORDER — INSULIN ASPART 100 UNIT/ML ~~LOC~~ SOLN: 0.0000 [IU] | Freq: Every day | SUBCUTANEOUS | Status: DC

## 2012-02-29 MED ORDER — INSULIN ASPART 100 UNIT/ML ~~LOC~~ SOLN
0.0000 [IU] | Freq: Three times a day (TID) | SUBCUTANEOUS | Status: DC
  Administered 2012-02-29: 10 [IU] via SUBCUTANEOUS
  Administered 2012-02-29: 8 [IU] via SUBCUTANEOUS

## 2012-02-29 MED ORDER — INSULIN GLARGINE 100 UNIT/ML ~~LOC~~ SOLN: 25.0000 [IU] | Freq: Two times a day (BID) | SUBCUTANEOUS | Status: DC

## 2012-02-29 MED ORDER — HYDROCODONE-ACETAMINOPHEN 5-325 MG PO TABS
2.0000 | ORAL_TABLET | Freq: Four times a day (QID) | ORAL | Status: DC | PRN
  Administered 2012-02-29 – 2012-03-01 (×7): 2 via ORAL
  Filled 2012-02-29 (×6): qty 2

## 2012-02-29 MED ORDER — INSULIN GLARGINE 100 UNIT/ML ~~LOC~~ SOLN
20.0000 [IU] | Freq: Every day | SUBCUTANEOUS | Status: DC
  Administered 2012-02-29: 20 [IU] via SUBCUTANEOUS

## 2012-02-29 MED ORDER — QUETIAPINE FUMARATE 50 MG PO TABS
50.0000 mg | ORAL_TABLET | Freq: Every day | ORAL | Status: DC
  Administered 2012-02-29: 50 mg via ORAL
  Filled 2012-02-29 (×2): qty 1

## 2012-02-29 MED ORDER — CLONAZEPAM 1 MG PO TABS: 1.0000 mg | ORAL_TABLET | Freq: Two times a day (BID) | ORAL | Status: DC | PRN

## 2012-02-29 MED ORDER — SODIUM CHLORIDE 0.9 % IV SOLN
INTRAVENOUS | Status: DC
  Administered 2012-02-29 – 2012-03-01 (×3): via INTRAVENOUS

## 2012-02-29 NOTE — Plan of Care (Signed)
Problem: Consults Goal: Diagnosis-Diabetes Mellitus Outcome: Completed/Met Date Met:  02/29/12 Hyperglycemia

## 2012-02-29 NOTE — Progress Notes (Signed)
Subjective: Denies CP, no SOB; no further N/V. Patient tolerating diet. Still feeling weak. Endorses some discomfort on his R shoulder (had hx of rotator cuff)  Objective: Vital signs in last 24 hours: Temp:  [97.1 F (36.2 C)-98.6 F (37 C)] 98.3 F (36.8 C) (04/01 0400) Pulse Rate:  [86-104] 86  (04/01 0420) Resp:  [12-18] 13  (04/01 0420) BP: (105-120)/(58-77) 105/58 mmHg (04/01 0420) SpO2:  [94 %-98 %] 97 % (04/01 0420) Weight:  [91 kg (200 lb 9.9 oz)] 91 kg (200 lb 9.9 oz) (04/01 0026) Weight change:  Last BM Date: 02/28/12  Intake/Output from previous day: 03/31 0701 - 04/01 0700 In: 2325.8 [I.V.:2125.8; IV Piggyback:200] Out: 1050 [Urine:1050]     Physical Exam: General: Alert, awake, oriented x3, in no acute distress. HEENT: No bruits, no goiter. Heart: Regular rate and rhythm, without murmurs, rubs, gallops. Lungs: Clear to auscultation bilaterally. Abdomen: Soft, nontender, nondistended, positive bowel sounds. Extremities: No clubbing cyanosis or edema with positive pedal pulses. Neuro: Grossly intact, nonfocal.   Lab Results: Basic Metabolic Panel:  Basename 02/29/12 0615 02/29/12 0414  NA 137 136  K 4.2 4.0  CL 106 106  CO2 22 22  GLUCOSE 279* 240*  BUN 11 12  CREATININE 0.79 0.78  CALCIUM 8.0* 8.0*  MG -- --  PHOS -- --   CBC:  Basename 02/29/12 0615 02/29/12 0024 02/28/12 1719  WBC 4.7 5.2 --  NEUTROABS -- -- 3.0  HGB 12.9* 14.2 --  HCT 37.3* 40.7 --  MCV 85.4 84.4 --  PLT 242 226 --   Cardiac Enzymes:  Basename 02/28/12 1820  CKTOTAL --  CKMB --  CKMBINDEX --  TROPONINI <0.30   CBG:  Basename 02/29/12 0725 02/29/12 0544 02/29/12 0326 02/29/12 0221 02/29/12 0119 02/29/12 0014  GLUCAP 278* 226* 162* 131* 136* 144*   Urine Drug Screen: Drugs of Abuse     Component Value Date/Time   LABOPIA NONE DETECTED 01/09/2012 2355   LABOPIA NEGATIVE 04/16/2011 1331   COCAINSCRNUR NONE DETECTED 01/09/2012 2355   COCAINSCRNUR NEGATIVE 04/16/2011  1331   LABBENZ NONE DETECTED 01/09/2012 2355   LABBENZ NEGATIVE 04/16/2011 1331   AMPHETMU NONE DETECTED 01/09/2012 2355   AMPHETMU NEGATIVE 04/16/2011 1331   THCU NONE DETECTED 01/09/2012 2355   LABBARB NONE DETECTED 01/09/2012 2355    Urinalysis:  Basename 02/28/12 1753  COLORURINE YELLOW  LABSPEC 1.032*  PHURINE 5.0  GLUCOSEU >1000*  HGBUR NEGATIVE  BILIRUBINUR NEGATIVE  KETONESUR TRACE*  PROTEINUR NEGATIVE  UROBILINOGEN 0.2  NITRITE NEGATIVE  LEUKOCYTESUR NEGATIVE   Misc. Labs:  Recent Results (from the past 240 hour(s))  MRSA PCR SCREENING     Status: Normal   Collection Time   02/29/12 12:01 AM      Component Value Range Status Comment   MRSA by PCR NEGATIVE  NEGATIVE  Final     Studies/Results: Dg Chest Portable 1 View  02/28/2012  *RADIOLOGY REPORT*  Clinical Data: Fatigue and hyperglycemia.  Sore throat and headache.  PORTABLE CHEST - 1 VIEW  Comparison: 01/10/2012  Findings: The heart size and mediastinal contours are within normal limits.  Both lungs are clear.  The visualized skeletal structures are unremarkable.  IMPRESSION: Negative exam.  Original Report Authenticated By: Rosealee Albee, M.D.    Medications: Scheduled Meds:    . sodium chloride  1,000 mL Intravenous Once   Followed by  . sodium chloride  1,000 mL Intravenous Once  . aspirin EC  81 mg Oral Daily  .  DULoxetine  30 mg Oral Daily  . enoxaparin (LOVENOX) injection  40 mg Subcutaneous Q24H  . insulin aspart  0-15 Units Subcutaneous TID WC  . insulin aspart  0-5 Units Subcutaneous QHS  . insulin glargine  35 Units Subcutaneous BID  . metFORMIN  1,000 mg Oral BID WC  . pantoprazole  40 mg Oral Q1200  . potassium chloride  10 mEq Intravenous Q1H  . QUEtiapine  50 mg Oral QHS  . sertraline  150 mg Oral Daily  . simvastatin  20 mg Oral q1800  . DISCONTD: insulin glargine  20 Units Subcutaneous QHS  . DISCONTD: insulin glargine  20 Units Subcutaneous QHS  . DISCONTD: insulin glargine  25 Units  Subcutaneous BID   Continuous Infusions:   . sodium chloride 1,000 mL (02/29/12 0041)  . sodium chloride    . insulin (NOVOLIN-R) infusion Stopped (02/29/12 0120)  . DISCONTD: sodium chloride 1,000 mL (02/28/12 2120)  . DISCONTD: sodium chloride    . DISCONTD: dextrose 5 % and 0.45% NaCl 125 mL (02/29/12 0023)  . DISCONTD: insulin (NOVOLIN-R) infusion 4.8 Units/hr (02/28/12 1834)   PRN Meds:.alum & mag hydroxide-simeth, clonazePAM, dextrose, HYDROcodone-acetaminophen, ondansetron (ZOFRAN) IV, ondansetron, DISCONTD: clonazePAM  Assessment/Plan: 1-Hyperglycemia: most likely secondary to viral infection and decrease use of insulin since he was initially no eating much due to N/V/D. At this point transition out of IV insulin. Will check A1C and will adjust his lantus as needed. Will continue SSI. Patient to follow modifiec carb diet.  2-Hypercholesterolemia: will check lipid profile and will restart statins.  3-DM (diabetes mellitus), type 2, uncontrolled: as above; will check A1C and will adjust insulin regimen. Continue metformin.  4-Depression: continue home medications.  5-Right rotator cuff tear: will continue pain control. Patient with an orthopedic appointment as an outpatient.  6-GERD: will continue PPI.  7-DVT: will use lovenox.    LOS: 1 day   Sheyla Zaffino [facility] [phone]  02/29/2012, 7:58 AM

## 2012-02-29 NOTE — Progress Notes (Signed)
Inpatient Diabetes Program Recommendations  AACE/ADA: New Consensus Statement on Inpatient Glycemic Control (2009)  Target Ranges:  Prepandial:   less than 140 mg/dL      Peak postprandial:   less than 180 mg/dL (1-2 hours)      Critically ill patients:  140 - 180 mg/dL   Reason for Visit: Admitted with diabetes out of control  Note: Hgb A1C 10.1.  Followed at [facility].  Patient states that he is generally careful to take his Lantus insulin-- when asked how many times he misses taking in a week he replied maybe once, but normally does not miss.  States he takes Novolog insulin based on sliding scale-- starting at 200, but later says he takes 6 to 10 units based on how he feels if unable to check CBG with meter-- so patient is possibly taking meal coverage.  Patient states that he missed some Novolog doses before admission when ill and not eating, but did not miss Lantus doses.  States that his home Lantus dose is 55 units daily-- not BID.  Not sure that I was able to get an accurate assessment of insulin regimen prior to admission.  Hgb A1C drawn 11/18/2011 was 13.0-- but typically in the 10.1 to 10.4 range.  Patient states that [facility] now has diabetes management sessions on Thursdays and periodically the pharmacist and nutritionist come.  (Has an appt with the nutritionist tomorrow, but will not be able to keep.)  States that metformin was started just before he became ill-- had only taken one dose.  Patient states he is feeling some better and is tolerating meals.  Will ask nursing staff to provide sick day rules and info about metformin at discharge.  Instructed patient is how to access the Patient Education Channel-- patient watched some videos during previous admission.

## 2012-03-01 LAB — BASIC METABOLIC PANEL
Calcium: 8.4 mg/dL (ref 8.4–10.5)
Creatinine, Ser: 0.73 mg/dL (ref 0.50–1.35)
GFR calc Af Amer: >90 mL/min (ref >90)
GFR calc non Af Amer: >90 mL/min (ref >90)
Sodium: 136 mEq/L (ref 135–145)

## 2012-03-01 MED ORDER — LORATADINE 10 MG PO TABS: 10.0000 mg | ORAL_TABLET | Freq: Every day | ORAL | Status: DC

## 2012-03-01 MED ORDER — FLUTICASONE PROPIONATE 50 MCG/ACT NA SUSP: 2.0000 | Freq: Every day | NASAL | Status: DC

## 2012-03-01 MED ORDER — INSULIN GLARGINE 100 UNIT/ML ~~LOC~~ SOLN: 35.0000 [IU] | Freq: Two times a day (BID) | SUBCUTANEOUS | Status: DC

## 2012-03-01 MED ORDER — HYDROCODONE-ACETAMINOPHEN 5-325 MG PO TABS: 1.0000 | ORAL_TABLET | Freq: Four times a day (QID) | ORAL | Status: AC | PRN

## 2012-03-01 NOTE — Progress Notes (Signed)
Talked to the patient about follow up care; PCP is Dr [name], patient stated that she is working on sending him to an Orthopedic specialist for his shoulder - torn rotator cuff. Patient has also applied for Medicaid and prescriptions are filled at [facility] on [facility]. Patient also stated that he has been a Diabetic since the 90's and knows how to take care of himself. Encourage patient to contact his PCP after discharge to make a hospital follow up. (His apt with Dr [name] is scheduled for June). Abelino Derrick RN, BSN, [place].

## 2012-03-01 NOTE — Discharge Instructions (Signed)
Diabetes, Keeping Your Heart and Blood Vessels Healthy Too much glucose (sugar) in the blood for a long period of time can cause problems for those who have diabetes. This high blood glucose can damage many parts of the body, such as the heart, blood vessels, eyes, and kidneys. Heart and blood vessel disease can lead to heart attacks and strokes, which is the leading cause of death for people with diabetes. There are many things you can to do slow down or prevent the complications from diabetes. WHAT DO MY HEART AND BLOOD VESSELS DO? Your heart and blood vessels make up your circulatory system. Your heart is a big muscle that pumps blood through your body. Your heart pumps blood carrying oxygen to large blood vessels (arteries) and small blood vessels (capillaries). Other blood vessels, called veins, carry blood back to the heart. HOW DO MY BLOOD VESSELS GET CLOGGED? Several things, including having diabetes, can make your blood cholesterol level too high. Cholesterol is a substance that is made by the body and used for many important functions. It is also found in some food that comes from animals. When cholesterol is too high, the insides of large blood vessels become narrowed, even clogged. Narrowed and clogged blood vessels make it harder for enough blood to get to all parts of your body. This problem is called atherosclerosis. WHAT CAN HAPPEN WHEN BLOOD VESSELS ARE CLOGGED? When arteries become narrowed and clogged, you may have heart problems and are at increased risk for heart attack and stroke:  Chest pain (angina) causes pain in your chest, arms, shoulders, or back. You may feel the pain more when your heart beats faster, such as when you exercise. The pain may go away when you rest. You also may feel very weak and sweaty. If you do not get treatment, chest pain may happen more often. If diabetes has damaged the heart nerves, you may not feel the chest pain.   Heart attack. A heart attack happens  when a blood vessel in or near the heart becomes blocked. Not enough blood can get to that part of the heart muscle so the area becomes oxygen deprived and the heart muscle may be permanently damaged.  WHAT ARE THE WARNING SIGNS OF A HEART ATTACK? You may have one or more of the following warning signs:  Chest pain or discomfort.   Pain or discomfort in your arms, back, jaw, or neck.   Indigestion or stomach pain.   Shortness of breath.   Sweating.   Nausea or vomiting.   Light-headedness.   You may have no warning signs at all, or they may come and go.  HOW DOES HEART DISEASE CAUSE HIGH BLOOD PRESSURE? Narrowed blood vessels leave a smaller opening for blood to flow through. It is like turning on a garden hose and holding your thumb over the opening. The smaller opening makes the water shoot out with more pressure. In the same way, narrowed blood vessels lead to high blood pressure. Other factors, such as kidney problems and being overweight, can also lead to high blood pressure. Many people with diabetes also have high blood pressure. If you have heart, eye, or kidney problems from diabetes, high blood pressure can make them worse. If you have high blood pressure, ask your caregiver how to lower it. Your caregiver may be asked to take blood pressure medicine every day. Some types of blood pressure medicine can also help keep your kidneys healthy. To lower your blood pressure, your may be  asked to lose weight; eat more fruits and vegetables; eat less salt and high-sodium foods, such as canned soups, luncheon meats, salty snack foods, and fast foods; and drink less alcohol. WHAT ARE THE WARNING SIGNS OF A STROKE? A stroke happens when part of your brain is not getting enough blood and stops working. Depending on the part of the brain that is damaged, a stroke can cause:  Sudden weakness or numbness of your face, arm, or leg on one side of your body.   Sudden confusion, trouble talking,  or trouble understanding.   Sudden dizziness, loss of balance, or trouble walking.   Sudden trouble seeing in one or both eyes or sudden double vision.   Sudden severe headache.  Sometimes, one or more of these warning signs may happen and then disappear. You might be having a "mini-stroke," also called a TIA (transient ischemic attack). If you have any of these warning signs, tell your caregiver right away. HOW CAN CLOGGED BLOOD VESSELS HURT MY LEGS AND FEET? Peripheral vascular disease can happen when the openings in your blood vessels become narrow and not enough blood gets to your legs and feet. You may feel pain in your buttocks, the back of your legs, or your thighs when you stand, walk, or exercise. Sometimes, surgery is necessary to treat this problem. WHAT CAN I DO TO KEEP MY BLOOD VESSELS HEALTHY AND PREVENT HEART DISEASE AND STROKE?  Keep your blood glucose under control. An A1c blood test will probably be ordered by your caregiver at least twice a year. The A1c test tells you your average blood glucose for the past 2 to 3 months and can give valuable information on the overall control of your diabetes.   Keep your blood pressure under control. Have it checked at every health care visit. If you are on medication to control blood pressure, take it exactly as prescribed. The target for most people is below 130/80.   Keep your cholesterol under control. Have it checked at least once a year. The targets for most people are:   LDL (bad) cholesterol: below 100 mg/dl.   HDL (good) cholesterol: above 40 mg/dl in men and above 50 mg/dl in women.   Triglycerides (another type of fat in the blood): below 150 mg/dl.   Make physical activity a part of your daily routine. Aim for at least 30 minutes of exercise most days of the week. Check with your caregiver to learn what activities are best for you.   Make sure that the foods you eat are "heart-healthy." Include foods high in fiber, such as  oat bran, oatmeal, whole-grain breads and cereals, fruits, and vegetables. Cut back on foods high in saturated fat or cholesterol, such as meats, butter, dairy products with fat, eggs, shortening, lard, and foods with palm oil or coconut oil.   Maintain a healthy weight. If you are overweight, try to exercise most days of the week. See a registered dietitian for help in planning meals and lowering the fat and calorie content.   If you smoke, QUIT. Your caregiver can tell you about ways to help you quit smoking.   Ask your caregiver whether you should take an aspirin every day. Studies have shown that taking a low dose of aspirin every day can help reduce your risk of heart disease and stroke.   Take your medicines as directed.  SEEK MEDICAL CARE IF:   You have any of the warning signs of a heart attack or stroke.  If you have pain in your legs or feet when walking.   If your feet and legs are cool or cold to the touch.  FOR MORE INFORMATION   Diabetes educators (nurses, dietitians, pharmacists, and other health professionals).   To find a diabetes educator near you, call the American Association of Diabetes Educators (AADE) toll-free at 1-800-TEAMUP4 408-383-0548), or look on the Internet at www.diabeteseducator.org and click on "Find a Diabetes Educator."   Dietitians.   To find a dietitian near you, call the American Dietetic Association toll-free at [phone], or look on the Internet at www.eatright.org and click on "Find a Nutrition Professional."   Government.   The National Heart, Lung, and Blood Institute (NHLBI) is part of the [employer]. To learn more about heart and blood vessel problems, write or call NHLBI Information Center, [id number], [place] [address], [phone]; or see PopSteam.is on the Internet.   To get more information about taking care of diabetes, contact:  National Diabetes Information Clearinghouse [address] [place], [place] [address] Phone: [phone] or [phone] Fax: [phone] Email: ndic@info .[employer].si Internet: www.diabetes.[employer].si National Diabetes Education Program [address] [place], [place] [address] Phone: [phone] Fax: [phone] Internet: CommunicationFinder.com.au American Diabetes Association [address] [place], [place] [address] Phone: [phone] Internet: www.diabetes.org [employer] [facility] [address] [place], [place] [address] Phone: [phone] Internet: www.jdrf.org Document Released: 11/19/2003 Document Revised: 11/05/2011 Document Reviewed: 04/26/2009 [facility] Patient Information 2012 [place], [place].

## 2012-03-01 NOTE — Discharge Summary (Signed)
Physician Discharge Summary  Patient ID: [name] [name] MRN: [id number] DOB/AGE: [birth year] 56 y.o.  Admit date: 02/28/2012 Discharge date: 03/01/2012  Primary Care Physician:  Norberto Sorenson, MD, MD   Discharge Diagnoses:   1-Hyperglycemia 2-viral illness  3-Hypercholesterolemia 4-Hypokalemia 5-DM (diabetes mellitus), type 2, uncontrolled (on insulin; A1C 10.1) 6-Right rotator cuff tear 7-Depression 8-GERD 9-allergic rhinitis  Medication List  As of 03/01/2012 12:29 PM   STOP taking these medications         NYQUIL 60-7.04-28-999 MG/30ML Liqd         TAKE these medications         aspirin EC 81 MG tablet   Take 81 mg by mouth daily.      calcium carbonate 500 MG chewable tablet   Commonly known as: TUMS - dosed in mg elemental calcium   Chew 1 tablet by mouth 3 (three) times daily as needed. As needed for indigestion.      clonazePAM 2 MG tablet   Commonly known as: KLONOPIN   Take 2 mg by mouth 2 (two) times daily as needed. For anxiety      DULoxetine 30 MG capsule   Commonly known as: CYMBALTA   Take 1 capsule (30 mg total) by mouth daily.      fluticasone 50 MCG/ACT nasal spray   Commonly known as: FLONASE   Place 2 sprays into the nose daily.      HYDROcodone-acetaminophen 5-325 MG per tablet   Commonly known as: NORCO   Take 1-2 tablets by mouth every 6 (six) hours as needed for pain.      insulin glargine 100 UNIT/ML injection   Commonly known as: LANTUS   Inject 35 Units into the skin 2 (two) times daily.      loratadine 10 MG tablet   Commonly known as: CLARITIN   Take 1 tablet (10 mg total) by mouth daily.      metFORMIN 1000 MG tablet   Commonly known as: GLUCOPHAGE   Take 1,000 mg by mouth 2 (two) times daily with a meal.      pantoprazole 40 MG tablet   Commonly known as: PROTONIX   Take 40 mg by mouth daily.      pravastatin 20 MG tablet   Commonly known as: PRAVACHOL   Take 20 mg by mouth daily.      QUEtiapine 50 MG tablet   Commonly known  as: SEROQUEL   Take 1 tablet (50 mg total) by mouth at bedtime.      sertraline 100 MG tablet   Commonly known as: ZOLOFT   Take 150 mg by mouth daily.      traZODone 100 MG tablet   Commonly known as: DESYREL   Take 1 tablet (100 mg total) by mouth 3 times/day as needed-between meals & bedtime for sleep.             Disposition and Follow-up:  Patient discharge in stable condition; currently w/o any nausea, vomiting or diarrhea. Patient is able to tolerate full diet and his blood sugar is well controlled. Patient advised to be compliant with medications and to arrange follow up with PCP in 2-3 weeks for further adjustments. He was also instructed to follow a low carbohydrates diet.   Consults:   None   Significant Diagnostic Studies:  Dg Chest Portable 1 View  02/28/2012  *RADIOLOGY REPORT*  Clinical Data: Fatigue and hyperglycemia.  Sore throat and headache.  PORTABLE CHEST - 1 VIEW  Comparison: 01/10/2012  Findings:  The heart size and mediastinal contours are within normal limits.  Both lungs are clear.  The visualized skeletal structures are unremarkable.  IMPRESSION: Negative exam.  Original Report Authenticated By: Rosealee Albee, M.D.    Brief H and P: 56 year old male who presents because of hyperglycemia. He states his wife was ill with a cold, which he caught from her. He developed a cough and sneezing. He had some nausea and vomiting, lots of diarrhea. No evidence of blood. Most of these symptoms have improved. He had some nonspecific generalized abdominal discomfort. His energy is very low, he's been lightheaded and felt presyncopal. Additionally, he developed hyperglycemia.  Hospital Course:  1-Hyperglycemia: most likely secondary to viral infection and decrease use of insulin since he was initially no eating much due to N/V/D. At discharge blood sugar better control; insulin adjusted for him to use lantus BID and to continue use of metformin; no longer experiencing  any N/V/D and able to tolerate full diet.  2-Hypercholesterolemia: Continue statins.  3-DM (diabetes mellitus), type 2, uncontrolled: A1C 10.1; will use lantus 35 BId (instead of 55 daily) and will continue metformin. Patient will follow with PCP and laso with diabetes coordinator as an outpatient.  4-Depression: continue home medications.   5-Right rotator cuff tear: will continue pain control and war compresses. Patient with an orthopedic appointment as an outpatient.   6-GERD: will continue PPI.   7-Allergic rhinitis: patient started on flonase and loratadine.  Rest of medical problems are stable and plan is to continue same medication regimen.   Time spent on Discharge: 40 minutes  Signed: Nyheem Binette 03/01/2012, 12:29 PM

## 2012-04-08 ENCOUNTER — Encounter (HOSPITAL_COMMUNITY): Payer: Self-pay | Admitting: Emergency Medicine

## 2012-04-08 ENCOUNTER — Inpatient Hospital Stay (HOSPITAL_COMMUNITY)
Admission: EM | Admit: 2012-04-08 | Discharge: 2012-04-09 | DRG: 638 | Disposition: A | Discharge status: Home / Self Care | Payer: Medicaid Other | Attending: Internal Medicine | Admitting: Internal Medicine

## 2012-04-08 DIAGNOSIS — E1142 Type 2 diabetes mellitus with diabetic polyneuropathy: Secondary | ICD-10-CM | POA: Diagnosis present

## 2012-04-08 DIAGNOSIS — E1010 Type 1 diabetes mellitus with ketoacidosis without coma: Secondary | ICD-10-CM

## 2012-04-08 DIAGNOSIS — R5381 Other malaise: Secondary | ICD-10-CM

## 2012-04-08 DIAGNOSIS — R5383 Other fatigue: Secondary | ICD-10-CM

## 2012-04-08 DIAGNOSIS — I10 Essential (primary) hypertension: Secondary | ICD-10-CM

## 2012-04-08 DIAGNOSIS — E860 Dehydration: Secondary | ICD-10-CM

## 2012-04-08 DIAGNOSIS — E1149 Type 2 diabetes mellitus with other diabetic neurological complication: Secondary | ICD-10-CM

## 2012-04-08 DIAGNOSIS — E118 Type 2 diabetes mellitus with unspecified complications: Secondary | ICD-10-CM

## 2012-04-08 DIAGNOSIS — E1165 Type 2 diabetes mellitus with hyperglycemia: Secondary | ICD-10-CM | POA: Diagnosis present

## 2012-04-08 DIAGNOSIS — K219 Gastro-esophageal reflux disease without esophagitis: Secondary | ICD-10-CM | POA: Diagnosis present

## 2012-04-08 DIAGNOSIS — R739 Hyperglycemia, unspecified: Secondary | ICD-10-CM | POA: Diagnosis present

## 2012-04-08 DIAGNOSIS — F329 Major depressive disorder, single episode, unspecified: Secondary | ICD-10-CM | POA: Diagnosis present

## 2012-04-08 DIAGNOSIS — E1310 Other specified diabetes mellitus with ketoacidosis without coma: Principal | ICD-10-CM | POA: Diagnosis present

## 2012-04-08 DIAGNOSIS — E7800 Pure hypercholesterolemia, unspecified: Secondary | ICD-10-CM | POA: Diagnosis present

## 2012-04-08 DIAGNOSIS — G629 Polyneuropathy, unspecified: Secondary | ICD-10-CM | POA: Diagnosis present

## 2012-04-08 DIAGNOSIS — Z794 Long term (current) use of insulin: Secondary | ICD-10-CM

## 2012-04-08 DIAGNOSIS — F32A Depression, unspecified: Secondary | ICD-10-CM | POA: Diagnosis present

## 2012-04-08 DIAGNOSIS — E871 Hypo-osmolality and hyponatremia: Secondary | ICD-10-CM | POA: Diagnosis present

## 2012-04-08 DIAGNOSIS — F3289 Other specified depressive episodes: Secondary | ICD-10-CM | POA: Diagnosis present

## 2012-04-08 DIAGNOSIS — E1110 Type 2 diabetes mellitus with ketoacidosis without coma: Secondary | ICD-10-CM | POA: Diagnosis present

## 2012-04-08 LAB — BASIC METABOLIC PANEL WITH GFR
BUN: 19 mg/dL (ref 6–23)
CO2: 21 meq/L (ref 19–32)
Calcium: 9.5 mg/dL (ref 8.4–10.5)
Chloride: 88 meq/L — ABNORMAL LOW (ref 96–112)
Creatinine, Ser: 0.98 mg/dL (ref 0.50–1.35)
GFR calc Af Amer: >90 mL/min
GFR calc non Af Amer: >90 mL/min
Glucose, Bld: 854 mg/dL (ref 70–99)
Potassium: 4.7 meq/L (ref 3.5–5.1)
Sodium: 124 meq/L — ABNORMAL LOW (ref 135–145)

## 2012-04-08 LAB — BASIC METABOLIC PANEL
BUN: 17 mg/dL (ref 6–23)
Creatinine, Ser: 0.84 mg/dL (ref 0.50–1.35)
GFR calc Af Amer: >90 mL/min (ref >90)
GFR calc non Af Amer: >90 mL/min (ref >90)

## 2012-04-08 LAB — GLUCOSE, CAPILLARY
Glucose-Capillary: >600 mg/dL (ref 70–99)
Glucose-Capillary: >600 mg/dL (ref 70–99)
Glucose-Capillary: 485 mg/dL — ABNORMAL HIGH (ref 70–99)
Glucose-Capillary: 142 mg/dL — ABNORMAL HIGH (ref 70–99)
Glucose-Capillary: 168 mg/dL — ABNORMAL HIGH (ref 70–99)

## 2012-04-08 LAB — CBC
HCT: 42 % (ref 39.0–52.0)
MCV: 85.7 fL (ref 78.0–100.0)
RBC: 4.9 MIL/uL (ref 4.22–5.81)
WBC: 7.6 10*3/uL (ref 4.0–10.5)

## 2012-04-08 LAB — URINE MICROSCOPIC-ADD ON: Urine-Other: NONE SEEN

## 2012-04-08 LAB — URINALYSIS, ROUTINE W REFLEX MICROSCOPIC
Bilirubin Urine: NEGATIVE
Glucose, UA: >1000 mg/dL — AB
Hgb urine dipstick: NEGATIVE
Ketones, ur: NEGATIVE mg/dL
Leukocytes, UA: NEGATIVE
pH: 6 (ref 5.0–8.0)

## 2012-04-08 MED ORDER — SODIUM CHLORIDE 0.9 % IV SOLN: INTRAVENOUS | Status: AC

## 2012-04-08 MED ORDER — SODIUM CHLORIDE 0.9 % IV SOLN
INTRAVENOUS | Status: AC
  Administered 2012-04-08: 14:00:00 via INTRAVENOUS

## 2012-04-08 MED ORDER — HYDROCODONE-ACETAMINOPHEN 5-325 MG PO TABS
2.0000 | ORAL_TABLET | Freq: Once | ORAL | Status: AC
  Administered 2012-04-08: 2 via ORAL
  Filled 2012-04-08: qty 2

## 2012-04-08 MED ORDER — SODIUM CHLORIDE 0.9 % IV SOLN
INTRAVENOUS | Status: DC
  Administered 2012-04-08: 7.3 [IU]/h via INTRAVENOUS
  Administered 2012-04-08: 4.3 [IU]/h via INTRAVENOUS
  Filled 2012-04-08: qty 1

## 2012-04-08 MED ORDER — MORPHINE SULFATE 2 MG/ML IJ SOLN
1.0000 mg | INTRAMUSCULAR | Status: DC | PRN
  Administered 2012-04-08 – 2012-04-09 (×2): 1 mg via INTRAVENOUS
  Filled 2012-04-08 (×2): qty 1

## 2012-04-08 MED ORDER — SODIUM CHLORIDE 0.9 % IV SOLN: INTRAVENOUS | Status: DC

## 2012-04-08 MED ORDER — DEXTROSE 50 % IV SOLN: 25.0000 mL | INTRAVENOUS | Status: DC | PRN

## 2012-04-08 MED ORDER — DEXTROSE-NACL 5-0.45 % IV SOLN: INTRAVENOUS | Status: DC

## 2012-04-08 MED ORDER — ENOXAPARIN SODIUM 40 MG/0.4ML ~~LOC~~ SOLN
40.0000 mg | SUBCUTANEOUS | Status: DC
  Administered 2012-04-08: 40 mg via SUBCUTANEOUS
  Filled 2012-04-08 (×2): qty 0.4

## 2012-04-08 MED ORDER — SODIUM CHLORIDE 0.9 % IV BOLUS (SEPSIS)
1000.0000 mL | Freq: Once | INTRAVENOUS | Status: AC
  Administered 2012-04-08: 1000 mL via INTRAVENOUS

## 2012-04-08 MED ORDER — INSULIN REGULAR BOLUS VIA INFUSION
0.0000 [IU] | Freq: Three times a day (TID) | INTRAVENOUS | Status: DC
  Filled 2012-04-08: qty 10

## 2012-04-08 MED ORDER — DEXTROSE 50 % IV SOLN
25.0000 mL | INTRAVENOUS | Status: DC | PRN
  Filled 2012-04-08: qty 50

## 2012-04-08 MED ORDER — DEXTROSE-NACL 5-0.45 % IV SOLN
INTRAVENOUS | Status: DC
  Administered 2012-04-08: 22:00:00 via INTRAVENOUS

## 2012-04-08 MED ORDER — POTASSIUM CHLORIDE 10 MEQ/100ML IV SOLN
10.0000 meq | INTRAVENOUS | Status: AC
  Administered 2012-04-08 (×2): 10 meq via INTRAVENOUS
  Filled 2012-04-08 (×2): qty 100

## 2012-04-08 NOTE — Progress Notes (Signed)
Pt states he has an active  [facility] card

## 2012-04-08 NOTE — ED Provider Notes (Addendum)
History     CSN: [id number]  Arrival date & time 04/08/12  4696   First MD Initiated Contact with Patient 04/08/12 1017      Chief Complaint  Patient presents with  . Hyperglycemia  . Nausea    (Consider location/radiation/quality/duration/timing/severity/associated sxs/prior treatment) HPI Pt with hx iddm, presents w high blood sugar. Pt went to pcp today, was told bs read 'high' on glucometer c/w severe hyperglycemia. +polyuria, no nausea/vomiting or diarrhea. . No fever or chills. States compliant w meds. Pt states is unsure how long blood sugars have been high.   Past Medical History  Diagnosis Date  . Diabetes mellitus   . Hypercholesterolemia   . GERD (gastroesophageal reflux disease)   . Hypertension   . Hypercholesteremia     hx of  . Hemorrhoids     hx of  . Peripheral neuropathy   . Depression     Past Surgical History  Procedure Date  . Tonsillectomy     Family History  Problem Relation Age of Onset  . Hypertension Mother     History  Substance Use Topics  . Smoking status: Never Smoker   . Smokeless tobacco: Never Used  . Alcohol Use: No      Review of Systems  Constitutional: Negative for fever.  Gastrointestinal: Negative for vomiting.  Genitourinary: Negative for decreased urine volume.  All other systems reviewed and are negative.    Allergies  Review of patient's allergies indicates no known allergies.  Home Medications   Current Outpatient Rx  Name Route Sig Dispense Refill  . ASPIRIN EC 81 MG PO TBEC Oral Take 81 mg by mouth daily.    Marland Kitchen CALCIUM CARBONATE ANTACID 500 MG PO CHEW Oral Chew 1 tablet by mouth 3 (three) times daily as needed. As needed for indigestion.    Marland Kitchen CLONAZEPAM 2 MG PO TABS Oral Take 2 mg by mouth 2 (two) times daily as needed. For anxiety    . FLUTICASONE PROPIONATE 50 MCG/ACT NA SUSP Nasal Place 2 sprays into the nose daily. 1 g 0  . HYDROCODONE-ACETAMINOPHEN 5-325 MG PO TABS Oral Take 1 tablet by mouth  every 6 (six) hours as needed. pain    . INSULIN GLARGINE 100 UNIT/ML [place] SOLN Subcutaneous Inject 55 Units into the skin at bedtime.    Marland Kitchen LORATADINE 10 MG PO TABS Oral Take 1 tablet (10 mg total) by mouth daily. 30 tablet 0  . MELOXICAM 7.5 MG PO TABS Oral Take 7.5 mg by mouth daily as needed. pain    . METFORMIN HCL 1000 MG PO TABS Oral Take 1,000 mg by mouth 2 (two) times daily with a meal.    . PANTOPRAZOLE SODIUM 40 MG PO TBEC Oral Take 40 mg by mouth daily.    . SERTRALINE HCL 100 MG PO TABS Oral Take 150 mg by mouth daily.    . QUETIAPINE FUMARATE 50 MG PO TABS Oral Take 1 tablet (50 mg total) by mouth at bedtime. 30 tablet 0  . TRAZODONE HCL 100 MG PO TABS Oral Take 1 tablet (100 mg total) by mouth 3 times/day as needed-between meals & bedtime for sleep. 30 tablet 0    BP 120/74  Pulse 83  Temp(Src) 97.7 F (36.5 C) (Oral)  Resp 16  SpO2 95%  Physical Exam  Nursing note and vitals reviewed. Constitutional: He is oriented to person, place, and time. He appears well-developed. No distress.  HENT:  Head: Atraumatic.  Nose: Nose normal.  Mouth/Throat: Oropharynx  is clear and moist.  Eyes: Pupils are equal, round, and reactive to light.  Neck: Neck supple. No tracheal deviation present.  Cardiovascular: Normal rate, regular rhythm, normal heart sounds and intact distal pulses.   Pulmonary/Chest: Effort normal and breath sounds normal. No accessory muscle usage. No respiratory distress.  Abdominal: Soft. Bowel sounds are normal. He exhibits no distension. There is no tenderness.  Musculoskeletal: Normal range of motion.  Neurological: He is alert and oriented to person, place, and time.  Skin: Skin is warm and dry. He is not diaphoretic.  Psychiatric: He has a normal mood and affect.    ED Course  Procedures (including critical care time)  Labs Reviewed  GLUCOSE, CAPILLARY - Abnormal; Notable for the following:    Glucose-Capillary >600 (*)    All other components within  normal limits  BASIC METABOLIC PANEL - Abnormal; Notable for the following:    Sodium 124 (*)    Chloride 88 (*)    Glucose, Bld 854 (*)    All other components within normal limits  URINALYSIS, ROUTINE W REFLEX MICROSCOPIC - Abnormal; Notable for the following:    Glucose, UA >1000 (*)    All other components within normal limits  GLUCOSE, CAPILLARY - Abnormal; Notable for the following:    Glucose-Capillary >600 (*)    All other components within normal limits  CBC  URINE MICROSCOPIC-ADD ON    Results for orders placed during the hospital encounter of 04/08/12  GLUCOSE, CAPILLARY      Component Value Range   Glucose-Capillary >600 (*) 70 - 99 (mg/dL)   Comment 1 Documented in Chart     Comment 2 Notify RN    CBC      Component Value Range   WBC 7.6  4.0 - 10.5 (K/uL)   RBC 4.90  4.22 - 5.81 (MIL/uL)   Hemoglobin 14.9  13.0 - 17.0 (g/dL)   HCT 32.9  51.8 - 84.1 (%)   MCV 85.7  78.0 - 100.0 (fL)   MCH 30.4  26.0 - 34.0 (pg)   MCHC 35.5  30.0 - 36.0 (g/dL)   RDW 66.0  63.0 - 16.0 (%)   Platelets 261  150 - 400 (K/uL)  BASIC METABOLIC PANEL      Component Value Range   Sodium 124 (*) 135 - 145 (mEq/L)   Potassium 4.7  3.5 - 5.1 (mEq/L)   Chloride 88 (*) 96 - 112 (mEq/L)   CO2 21  19 - 32 (mEq/L)   Glucose, Bld 854 (*) 70 - 99 (mg/dL)   BUN 19  6 - 23 (mg/dL)   Creatinine, Ser 1.09  0.50 - 1.35 (mg/dL)   Calcium 9.5  8.4 - 32.3 (mg/dL)   GFR calc non Af Amer >90  >90 (mL/min)   GFR calc Af Amer >90  >90 (mL/min)  URINALYSIS, ROUTINE W REFLEX MICROSCOPIC      Component Value Range   Color, Urine YELLOW  YELLOW    APPearance CLEAR  CLEAR    Specific Gravity, Urine 1.025  1.005 - 1.030    pH 6.0  5.0 - 8.0    Glucose, UA >1000 (*) NEGATIVE (mg/dL)   Hgb urine dipstick NEGATIVE  NEGATIVE    Bilirubin Urine NEGATIVE  NEGATIVE    Ketones, ur NEGATIVE  NEGATIVE (mg/dL)   Protein, ur NEGATIVE  NEGATIVE (mg/dL)   Urobilinogen, UA 0.2  0.0 - 1.0 (mg/dL)   Nitrite NEGATIVE   NEGATIVE    Leukocytes, UA NEGATIVE  NEGATIVE   GLUCOSE, CAPILLARY      Component Value Range   Glucose-Capillary >600 (*) 70 - 99 (mg/dL)   Comment 1 Documented in Chart     Comment 2 Notify RN    URINE MICROSCOPIC-ADD ON      Component Value Range   Urine-Other       Value: NO FORMED ELEMENTS SEEN ON URINE MICROSCOPIC EXAMINATION      MDM  1 liter ns bolus.  bs high initially.  2nd liter ns.   Glucose 850+, glucose stabilizer/insulin iv. sl elev gap, will admit to med service w severe hyperglycemia/borderline dka.  [facility] indicates admit.         Suzi Roots, MD 04/08/12 1348  Suzi Roots, MD 06/01/12 [phone]

## 2012-04-08 NOTE — Progress Notes (Signed)
Pt confirms he is still active with [facility] and sees Dr Norberto Sorenson as pcp

## 2012-04-08 NOTE — Progress Notes (Signed)
Referred to [facility] inpatient liaison

## 2012-04-08 NOTE — ED Notes (Signed)
Lab reports "just now received the blood at 1142."

## 2012-04-08 NOTE — H&P (Addendum)
PCP:   Norberto Sorenson, MD, MD   Chief Complaint:  Nausea vomiting and fatigue  HPI: Patient is a 56 year old F. American male with past history of diabetes mellitus type 2 on insulin poorly controlled as well as hypertension who presents with several days of generalized malaise plus nausea and vomiting. When he came to the emergency room he was noted to have a sugar of greater than 800 and a mild anion gap of 15. Patient was admitted and started on the DKA protocol. Hospitals were called for admission. Patient has had multiple admissions in the past for DKA management, the most recent being approximately 5 weeks ago.  Review of Systems:  When I saw the patient emergency room, he is complaining of general fatigue. He denied headaches, vision changes, dysphasia, chest pain, palpitations, shortness of breath, wheeze, cough, abdominal pain, hematuria, dysuria, diarrhea, focal extremity numbness or weakness or pain. His chronic peripheral neuropathy. He does do some mild nausea but no vomiting now. His review of systems otherwise negative.  Past Medical History: Past Medical History  Diagnosis Date  . Diabetes mellitus   . Hypercholesterolemia   . GERD (gastroesophageal reflux disease)   . Hypertension   . Hypercholesteremia     hx of  . Hemorrhoids     hx of  . Peripheral neuropathy   . Depression    Past Surgical History  Procedure Date  . Tonsillectomy     Medications: Prior to Admission medications   Medication Sig Start Date End Date Taking? Authorizing Provider  aspirin EC 81 MG tablet Take 81 mg by mouth daily.   Yes Historical Provider, MD  calcium carbonate (TUMS - DOSED IN MG ELEMENTAL CALCIUM) 500 MG chewable tablet Chew 1 tablet by mouth 3 (three) times daily as needed. As needed for indigestion.   Yes Historical Provider, MD  clonazePAM (KLONOPIN) 2 MG tablet Take 2 mg by mouth 2 (two) times daily as needed. For anxiety   Yes Historical Provider, MD  fluticasone (FLONASE) 50  MCG/ACT nasal spray Place 2 sprays into the nose daily. 03/01/12 03/01/13 Yes Vassie Loll, MD  HYDROcodone-acetaminophen (NORCO) 5-325 MG per tablet Take 1 tablet by mouth every 6 (six) hours as needed. pain   Yes Historical Provider, MD  insulin glargine (LANTUS) 100 UNIT/ML injection Inject 55 Units into the skin at bedtime. 03/01/12 03/01/13 Yes Vassie Loll, MD  meloxicam (MOBIC) 7.5 MG tablet Take 7.5 mg by mouth daily as needed. pain   Yes Historical Provider, MD  metFORMIN (GLUCOPHAGE) 1000 MG tablet Take 1,000 mg by mouth 2 (two) times daily with a meal.   Yes Historical Provider, MD  pantoprazole (PROTONIX) 40 MG tablet Take 40 mg by mouth daily. 12/26/11  Yes Srikar Cherlynn Kaiser, MD  sertraline (ZOLOFT) 100 MG tablet Take 150 mg by mouth daily.   Yes Historical Provider, MD  QUEtiapine (SEROQUEL) 50 MG tablet Take 1 tablet (50 mg total) by mouth at bedtime. 01/13/12 02/12/12  Edsel Petrin, DO  traZODone (DESYREL) 100 MG tablet Take 1 tablet (100 mg total) by mouth 3 times/day as needed-between meals & bedtime for sleep. 01/13/12 02/12/12  Edsel Petrin, DO    Allergies:  No Known Allergies  Social History:  reports that he has never smoked. He has never used smokeless tobacco. He reports that he does not drink alcohol or use illicit drugs. The patient is normally at baseline able to participate in most activities of daily living, limited only by his neuropathy. He lives  at home with family.  Family History: Family History  Problem Relation Age of Onset  . Hypertension Mother     Physical Exam: Filed Vitals:   04/08/12 1402 04/08/12 1500 04/08/12 1548 04/08/12 1553  BP: 121/74 123/75 119/72   Pulse: 80 81 90   Temp:    98.3 F (36.8 C)  TempSrc:    Oral  Resp: 16 14 20    SpO2: 96% 96% 99%    General: Alert and oriented x3, no acute distress, fatigued, looks older than stated age HEENT: Normocephalic, atraumatic, mucous members are slightly dry Cardiovascular: Regular rate  and rhythm, S1-S2 Lungs: Clear to auscultation bilaterally Abdomen: Soft, nontender, nondistended, positive bowel sounds Extremities: No clubbing or cyanosis, trace pitting edema   Labs on Admission:   [facility] 04/08/12 1138  NA 124*  K 4.7  CL 88*  CO2 21  GLUCOSE 854*  BUN 19  CREATININE 0.98  CALCIUM 9.5  MG --  PHOS --    Basename 04/08/12 1138  WBC 7.6  NEUTROABS --  HGB 14.9  HCT 42.0  MCV 85.7  PLT 261    Radiological Exams on Admission: No results found.  Assessment/Plan Present on Admission:  .Hypercholesterolemia: Home meds on hold. Stable.  .DM (diabetes mellitus), type 2, uncontrolled with complications: See below.  .Depression: Stable.  Marland KitchenGERD (gastroesophageal reflux disease): Continue PPI.  .DKA, type 2: Insulin drip plus IV fluids. I expect that he will rapidly turns over. Possible discharge tomorrow.  .Peripheral neuropathy  Hyponatremia: Please note this is not true hyponatremia and this only because of his elevated blood sugars. It will correct as his sugars improve.  After discussion with the patient, he is to be a full code.  We will respect these wishes.  I anticipate his length of stay to be 1-2 days based on mild presentation, unless something should change.  Time spent on this patient including examination and decision-making process: 35 minutes.  Hollice Espy [phone] 04/08/2012, 4:27 PM

## 2012-04-08 NOTE — ED Notes (Signed)
Pt had f/u appt with [facility] today for diabetes. bs was "high" per their glucometer. Pt is ambulatory on arrival. No distress.

## 2012-04-08 NOTE — ED Notes (Signed)
Bed:WA17<BR> Expected date:<BR> Expected time:<BR> Means of arrival:<BR> Comments:<BR> ems

## 2012-04-09 DIAGNOSIS — E1149 Type 2 diabetes mellitus with other diabetic neurological complication: Secondary | ICD-10-CM

## 2012-04-09 DIAGNOSIS — E1010 Type 1 diabetes mellitus with ketoacidosis without coma: Secondary | ICD-10-CM

## 2012-04-09 DIAGNOSIS — I10 Essential (primary) hypertension: Secondary | ICD-10-CM

## 2012-04-09 DIAGNOSIS — R5383 Other fatigue: Secondary | ICD-10-CM

## 2012-04-09 DIAGNOSIS — R5381 Other malaise: Secondary | ICD-10-CM

## 2012-04-09 LAB — BASIC METABOLIC PANEL
BUN: 16 mg/dL (ref 6–23)
BUN: 14 mg/dL (ref 6–23)
CO2: 24 mEq/L (ref 19–32)
Calcium: 8.5 mg/dL (ref 8.4–10.5)
Calcium: 8 mg/dL — ABNORMAL LOW (ref 8.4–10.5)
Calcium: 7.9 mg/dL — ABNORMAL LOW (ref 8.4–10.5)
Creatinine, Ser: 0.79 mg/dL (ref 0.50–1.35)
Creatinine, Ser: 0.72 mg/dL (ref 0.50–1.35)
GFR calc Af Amer: >90 mL/min (ref >90)
GFR calc Af Amer: >90 mL/min (ref >90)
GFR calc non Af Amer: >90 mL/min (ref >90)
GFR calc non Af Amer: >90 mL/min (ref >90)
Glucose, Bld: 328 mg/dL — ABNORMAL HIGH (ref 70–99)
Potassium: 4.3 mEq/L (ref 3.5–5.1)
Sodium: 134 mEq/L — ABNORMAL LOW (ref 135–145)

## 2012-04-09 LAB — GLUCOSE, CAPILLARY
Glucose-Capillary: 101 mg/dL — ABNORMAL HIGH (ref 70–99)
Glucose-Capillary: 105 mg/dL — ABNORMAL HIGH (ref 70–99)
Glucose-Capillary: 142 mg/dL — ABNORMAL HIGH (ref 70–99)
Glucose-Capillary: 160 mg/dL — ABNORMAL HIGH (ref 70–99)
Glucose-Capillary: 211 mg/dL — ABNORMAL HIGH (ref 70–99)
Glucose-Capillary: 84 mg/dL (ref 70–99)
Glucose-Capillary: 99 mg/dL (ref 70–99)
Glucose-Capillary: 165 mg/dL — ABNORMAL HIGH (ref 70–99)
Glucose-Capillary: 187 mg/dL — ABNORMAL HIGH (ref 70–99)

## 2012-04-09 MED ORDER — HYDROCODONE-ACETAMINOPHEN 5-325 MG PO TABS: 1.0000 | ORAL_TABLET | Freq: Four times a day (QID) | ORAL | Status: DC | PRN

## 2012-04-09 MED ORDER — INSULIN GLARGINE 100 UNIT/ML ~~LOC~~ SOLN
10.0000 [IU] | Freq: Every day | SUBCUTANEOUS | Status: DC
  Administered 2012-04-09: 10 [IU] via SUBCUTANEOUS

## 2012-04-09 MED ORDER — INSULIN GLARGINE 100 UNIT/ML ~~LOC~~ SOLN
30.0000 [IU] | SUBCUTANEOUS | Status: AC
  Administered 2012-04-09: 30 [IU] via SUBCUTANEOUS

## 2012-04-09 MED ORDER — INSULIN ASPART 100 UNIT/ML ~~LOC~~ SOLN: 0.0000 [IU] | Freq: Every day | SUBCUTANEOUS | Status: DC

## 2012-04-09 MED ORDER — INSULIN GLARGINE 100 UNIT/ML ~~LOC~~ SOLN: 35.0000 [IU] | Freq: Two times a day (BID) | SUBCUTANEOUS | Status: DC

## 2012-04-09 MED ORDER — INSULIN ASPART 100 UNIT/ML ~~LOC~~ SOLN
0.0000 [IU] | Freq: Three times a day (TID) | SUBCUTANEOUS | Status: DC
  Administered 2012-04-09 (×2): 3 [IU] via SUBCUTANEOUS
  Administered 2012-04-09: 15 [IU] via SUBCUTANEOUS

## 2012-04-09 NOTE — Progress Notes (Signed)
Requested order for Vicodin tabs at patient request due to pain in right shoulder. Medicated patient.

## 2012-04-09 NOTE — Discharge Summary (Signed)
DISCHARGE SUMMARY  [name] [name]  MR#: [id number]  DOB:1956-11-17  Date of Admission: 04/08/2012 Date of Discharge: 04/09/2012  Attending Physician:Richie Bonanno K  Patient's NWG:NFAO,ZHY, MD, MD  Consults: -none  Discharge Diagnoses: Present on Admission:  .Hypercholesterolemia .DM (diabetes mellitus), type 2, uncontrolled with complications .Depression .GERD (gastroesophageal reflux disease) .DKA, type 2 .Peripheral neuropathy .Hyperglycemia   Initial presentation: Patient is a 56 year old African American male past history diabetes mellitus and secondary peripheral neuropathy along with hyperlipidemia who after several days of malaise and nausea and vomiting came into the emergency room on 04/08/12 and found to be in mild DKA. He is admitted to the hospitalist service.  Hospital Course: Patient Active Problem List  Diagnoses  . Hypercholesterolemia: Stable.   Marland Kitchen Hyponatremia: Stable. This was actually pseudohyponatremia from DKA and corrected when sugars were corrected.   Marland Kitchen Uncontrolled type 2 diabetes mellitus: With DKA resolved, he resume his home meds.   . Depression: Stable.   Marland Kitchen GERD (gastroesophageal reflux disease): Continue PPI. Stable.   . Right rotator cuff tear: Gave him a few doses of Percocet for prescription. Have referred him to orthopedic surgery.   . DKA, type 2: Patient was started on IV fluids plus IV insulin and DKA protocol. His anion gap quickly resolved and he was changed over to his home doses of medications. He is tolerating by mouth this morning and will be discharged home.   . Peripheral neuropathy continued on Neurontin.       Medication List  As of 04/09/2012 12:07 PM   STOP taking these medications         loratadine 10 MG tablet         TAKE these medications         aspirin EC 81 MG tablet   Take 81 mg by mouth daily.      calcium carbonate 500 MG chewable tablet   Commonly known as: TUMS - dosed in mg elemental calcium   Chew 1 tablet by mouth 3 (three) times daily as needed. As needed for indigestion.      clonazePAM 2 MG tablet   Commonly known as: KLONOPIN   Take 2 mg by mouth 2 (two) times daily as needed. For anxiety      fluticasone 50 MCG/ACT nasal spray   Commonly known as: FLONASE   Place 2 sprays into the nose daily.      HYDROcodone-acetaminophen 5-325 MG per tablet   Commonly known as: NORCO   Take 1 tablet by mouth every 6 (six) hours as needed. pain      insulin glargine 100 UNIT/ML injection   Commonly known as: LANTUS   Inject 35 Units into the skin 2 (two) times daily.      meloxicam 7.5 MG tablet   Commonly known as: MOBIC   Take 7.5 mg by mouth daily as needed. pain      metFORMIN 1000 MG tablet   Commonly known as: GLUCOPHAGE   Take 1,000 mg by mouth 2 (two) times daily with a meal.      pantoprazole 40 MG tablet   Commonly known as: PROTONIX   Take 40 mg by mouth daily.      QUEtiapine 50 MG tablet   Commonly known as: SEROQUEL   Take 1 tablet (50 mg total) by mouth at bedtime.      sertraline 100 MG tablet   Commonly known as: ZOLOFT   Take 150 mg by mouth daily.      traZODone 100  MG tablet   Commonly known as: DESYREL   Take 1 tablet (100 mg total) by mouth 3 times/day as needed-between meals & bedtime for sleep.             Day of Discharge BP 118/78  Pulse 73  Temp(Src) 97.9 F (36.6 C) (Oral)  Resp 15  Ht 5\' 10"  (1.778 m)  Wt 90.719 kg (200 lb)  BMI 28.70 kg/m2  SpO2 95%  Physical Exam: General: Alert and oriented x3, no acute distress, flat affect, looks older than stated age, fatigued HEENT: Normocephalic and atraumatic, mucous her meds are moist Cardiovascular: Regular rate and rhythm, S1-S2 Lungs: Clear to auscultation bilaterally Abdomen: Soft, nontender, nondistended, hypoactive bowel sounds Extremities: No clubbing or cyanosis or edema. I was able to abduct and adduct his right shoulder but he has some discomfort with this.  Results  for orders placed during the hospital encounter of 04/08/12 (from the past 24 hour(s))  GLUCOSE, CAPILLARY     Status: Abnormal   Collection Time   04/08/12  1:54 PM      Component Value Range   Glucose-Capillary 485 (*) 70 - 99 (mg/dL)   Comment 1 Notify RN     Comment 2 Documented in Chart    GLUCOSE, CAPILLARY     Status: Abnormal   Collection Time   04/08/12  2:56 PM      Component Value Range   Glucose-Capillary 427 (*) 70 - 99 (mg/dL)   Comment 1 Documented in Chart     Comment 2 Notify RN    GLUCOSE, CAPILLARY     Status: Abnormal   Collection Time   04/08/12  3:52 PM      Component Value Range   Glucose-Capillary 298 (*) 70 - 99 (mg/dL)   Comment 1 Notify RN     Comment 2 Documented in Chart    GLUCOSE, CAPILLARY     Status: Abnormal   Collection Time   04/08/12  5:00 PM      Component Value Range   Glucose-Capillary 189 (*) 70 - 99 (mg/dL)  GLUCOSE, CAPILLARY     Status: Abnormal   Collection Time   04/08/12  6:08 PM      Component Value Range   Glucose-Capillary 142 (*) 70 - 99 (mg/dL)  GLUCOSE, CAPILLARY     Status: Abnormal   Collection Time   04/08/12  7:41 PM      Component Value Range   Glucose-Capillary 168 (*) 70 - 99 (mg/dL)  GLUCOSE, CAPILLARY     Status: Abnormal   Collection Time   04/08/12  8:53 PM      Component Value Range   Glucose-Capillary 188 (*) 70 - 99 (mg/dL)  GLUCOSE, CAPILLARY     Status: Abnormal   Collection Time   04/08/12  9:58 PM      Component Value Range   Glucose-Capillary 183 (*) 70 - 99 (mg/dL)  BASIC METABOLIC PANEL     Status: Abnormal   Collection Time   04/08/12 10:00 PM      Component Value Range   Sodium 140  135 - 145 (mEq/L)   Potassium 4.2  3.5 - 5.1 (mEq/L)   Chloride 107  96 - 112 (mEq/L)   CO2 23  19 - 32 (mEq/L)   Glucose, Bld 192 (*) 70 - 99 (mg/dL)   BUN 17  6 - 23 (mg/dL)   Creatinine, Ser 1.61  0.50 - 1.35 (mg/dL)   Calcium 8.3 (*) 8.4 -  10.5 (mg/dL)   GFR calc non Af Amer >90  >90 (mL/min)   GFR calc Af  Amer >90  >90 (mL/min)  GLUCOSE, CAPILLARY     Status: Abnormal   Collection Time   04/08/12 10:51 PM      Component Value Range   Glucose-Capillary 211 (*) 70 - 99 (mg/dL)  GLUCOSE, CAPILLARY     Status: Abnormal   Collection Time   04/08/12 11:57 PM      Component Value Range   Glucose-Capillary 142 (*) 70 - 99 (mg/dL)  GLUCOSE, CAPILLARY     Status: Abnormal   Collection Time   04/09/12 12:59 AM      Component Value Range   Glucose-Capillary 101 (*) 70 - 99 (mg/dL)  GLUCOSE, CAPILLARY     Status: Abnormal   Collection Time   04/09/12  1:52 AM      Component Value Range   Glucose-Capillary 105 (*) 70 - 99 (mg/dL)  BASIC METABOLIC PANEL     Status: Normal   Collection Time   04/09/12  2:20 AM      Component Value Range   Sodium 138  135 - 145 (mEq/L)   Potassium 3.7  3.5 - 5.1 (mEq/L)   Chloride 105  96 - 112 (mEq/L)   CO2 24  19 - 32 (mEq/L)   Glucose, Bld 85  70 - 99 (mg/dL)   BUN 16  6 - 23 (mg/dL)   Creatinine, Ser 0.98  0.50 - 1.35 (mg/dL)   Calcium 8.5  8.4 - 11.9 (mg/dL)   GFR calc non Af Amer >90  >90 (mL/min)   GFR calc Af Amer >90  >90 (mL/min)  GLUCOSE, CAPILLARY     Status: Normal   Collection Time   04/09/12  3:02 AM      Component Value Range   Glucose-Capillary 84  70 - 99 (mg/dL)  GLUCOSE, CAPILLARY     Status: Normal   Collection Time   04/09/12  4:08 AM      Component Value Range   Glucose-Capillary 99  70 - 99 (mg/dL)  GLUCOSE, CAPILLARY     Status: Abnormal   Collection Time   04/09/12  5:10 AM      Component Value Range   Glucose-Capillary 160 (*) 70 - 99 (mg/dL)  GLUCOSE, CAPILLARY     Status: Abnormal   Collection Time   04/09/12  5:51 AM      Component Value Range   Glucose-Capillary 165 (*) 70 - 99 (mg/dL)  BASIC METABOLIC PANEL     Status: Abnormal   Collection Time   04/09/12  6:05 AM      Component Value Range   Sodium 134 (*) 135 - 145 (mEq/L)   Potassium 4.7  3.5 - 5.1 (mEq/L)   Chloride 103  96 - 112 (mEq/L)   CO2 19  19 - 32 (mEq/L)    Glucose, Bld 328 (*) 70 - 99 (mg/dL)   BUN 13  6 - 23 (mg/dL)   Creatinine, Ser 1.47  0.50 - 1.35 (mg/dL)   Calcium 8.0 (*) 8.4 - 10.5 (mg/dL)   GFR calc non Af Amer >90  >90 (mL/min)   GFR calc Af Amer >90  >90 (mL/min)  GLUCOSE, CAPILLARY     Status: Abnormal   Collection Time   04/09/12  7:39 AM      Component Value Range   Glucose-Capillary 187 (*) 70 - 99 (mg/dL)  BASIC METABOLIC PANEL  Status: Abnormal   Collection Time   04/09/12  9:25 AM      Component Value Range   Sodium 136  135 - 145 (mEq/L)   Potassium 4.3  3.5 - 5.1 (mEq/L)   Chloride 103  96 - 112 (mEq/L)   CO2 23  19 - 32 (mEq/L)   Glucose, Bld 365 (*) 70 - 99 (mg/dL)   BUN 14  6 - 23 (mg/dL)   Creatinine, Ser 1.32  0.50 - 1.35 (mg/dL)   Calcium 7.9 (*) 8.4 - 10.5 (mg/dL)   GFR calc non Af Amer >90  >90 (mL/min)   GFR calc Af Amer >90  >90 (mL/min)    Disposition: Improved, being discharged home   Follow-up Appts: Discharge Orders    Future Orders Please Complete By Expires   Diet Carb Modified      Increase activity slowly         Follow-up Information    Follow up with [facility], MD in 1 week.   Contact information:   [address] [place] [address] [phone]       Schedule an appointment as soon as possible for a visit with Erasmo Leventhal, MD. (For your shoulder )    Contact information:   [facility] [address] [place] [address] [phone]          Tests Needing Follow-up: None  Time spent in discharge (includes decision making & examination of pt): 35 minutes  Signed: Hollice Espy 04/09/2012, 12:07 PM

## 2012-04-09 NOTE — Discharge Instructions (Signed)
Diabetic Ketoacidosis Diabetic ketoacidosis (DKA) is a life-threatening complication of type 1 diabetes. It must be quickly recognized and treated. Treatment requires hospitalization. CAUSES  When there is no insulin in the body, glucose (sugar) cannot be used and the body breaks down fat for energy. When fat breaks down, acids (ketones) build up in the blood. Very high levels of glucose and high levels of acids lead to severe loss of body fluids (dehydration) and other dangerous chemical changes. This stresses your vital organs and can cause coma or death. SYMPTOMS   Tiredness (fatigue).   Weight loss.   Excessive thirst.   Ketones in the urine.   Lightheadedness.   Fruity or sweet smell on your breath.   Excessive urination.   Visual changes.   Confusion or irritability.   Feeling sick to your stomach (nauseous) or vomiting.   Rapid breathing.   Stomachache or belly (abdominal) pain.  DIAGNOSIS  Your caregiver will diagnose DKA based on your history, physical exam, and blood tests. Your caregiver will check if there is another illness present which caused you to go into DKA. Most of this will be done quickly in an emergency room. TREATMENT   Fluid replacement to correct dehydration.   Insulin.   Correction of electrolytes, such as potassium and sodium.   Medicines (antibiotics) that kill germs for infections.  PREVENTION  Always take your insulin. Do not skip your insulin injections.   If you are ill, treat yourself quickly. Your body often needs more insulin to fight the illness.   Check your blood glucose regularly.   Check urine ketones if your blood glucose is greater than 240 milligrams per deciliter (mg/dl).   Do not used expired or outdated insulin.   If your blood glucose is high, drink plenty of fluids. This helps flush out ketones.  HOME CARE INSTRUCTIONS   If you are ill, follow the advice of your caregiver.   To prevent loss of body fluids  (dehydration), drink enough water and fluids to keep your urine clear or pale yellow.   If you cannot eat, alternate between drinking fluids with sugar (soda, juices, flavored gelatin) and salty fluids (broth, bouillon).   If you can eat, follow your usual diet and drink sugar-free liquids (water, diet drinks).   Always take your usual dose of insulin. If you cannot eat, or your glucose is getting too low, call your caregiver for further instructions.   Continue to monitor your blood or urine ketones every 3 to 4 hours around the clock. Set your alarm clock or have someone wake you up. If you are too sick, have someone test it for you.   Rest and avoid exercise.  SEEK MEDICAL CARE IF:   You have ketones in your urine or your blood glucose is higher than a level your caregiver suggests. You may need extra insulin. Call your caregiver if you need advice on adjusting your insulin.   You cannot drink at least a tablespoon of fluid every 15 to 20 minutes.   You have been throwing up for more than 2 hours.   You have symptoms of DKA:   Fruity smelling breath.   Breathing faster or slower.   Becoming very sleepy.  SEEK IMMEDIATE MEDICAL CARE IF:   You have signs of dehydration:   Decreased urination.   Increased thirst.   Dry skin and mouth.   Lightheadedness.   Your blood glucose is very high (as advised by your caregiver) twice in a row.     You or your child has an oral temperature above 102 F (38.9 C), not controlled by medicine.   You pass out.   You have chest pain and/or trouble breathing.   You have a sudden, severe headache.   You have sudden weakness in one arm and/or one leg.   You have sudden difficulty speaking and/or swallowing.   You develop vomiting and/or diarrhea that is getting worse after 3 to 4 hours.   You have abdominal pain.  MAKE SURE YOU:   Understand these instructions.   Will watch your condition.   Will get help right away if you are  not doing well or get worse.  Document Released: 11/13/2000 Document Revised: 11/05/2011 Document Reviewed: 05/22/2009 [employer] Patient Information 2012 [employer]. 

## 2012-05-08 ENCOUNTER — Encounter (HOSPITAL_COMMUNITY): Payer: Self-pay

## 2012-05-08 ENCOUNTER — Emergency Department (HOSPITAL_COMMUNITY): Payer: Medicaid Other

## 2012-05-08 ENCOUNTER — Emergency Department (HOSPITAL_COMMUNITY)
Admission: EM | Admit: 2012-05-08 | Discharge: 2012-05-08 | Disposition: A | Discharge status: Home / Self Care | Payer: Medicaid Other | Attending: Emergency Medicine | Admitting: Emergency Medicine

## 2012-05-08 DIAGNOSIS — E119 Type 2 diabetes mellitus without complications: Secondary | ICD-10-CM | POA: Insufficient documentation

## 2012-05-08 DIAGNOSIS — G8929 Other chronic pain: Secondary | ICD-10-CM | POA: Insufficient documentation

## 2012-05-08 DIAGNOSIS — R10819 Abdominal tenderness, unspecified site: Secondary | ICD-10-CM | POA: Insufficient documentation

## 2012-05-08 DIAGNOSIS — R739 Hyperglycemia, unspecified: Secondary | ICD-10-CM

## 2012-05-08 DIAGNOSIS — R109 Unspecified abdominal pain: Secondary | ICD-10-CM | POA: Insufficient documentation

## 2012-05-08 DIAGNOSIS — M25519 Pain in unspecified shoulder: Secondary | ICD-10-CM | POA: Insufficient documentation

## 2012-05-08 DIAGNOSIS — K7689 Other specified diseases of liver: Secondary | ICD-10-CM | POA: Insufficient documentation

## 2012-05-08 DIAGNOSIS — M549 Dorsalgia, unspecified: Secondary | ICD-10-CM | POA: Insufficient documentation

## 2012-05-08 DIAGNOSIS — K449 Diaphragmatic hernia without obstruction or gangrene: Secondary | ICD-10-CM | POA: Insufficient documentation

## 2012-05-08 DIAGNOSIS — Z794 Long term (current) use of insulin: Secondary | ICD-10-CM | POA: Insufficient documentation

## 2012-05-08 LAB — POCT I-STAT, CHEM 8
BUN: 9 mg/dL (ref 6–23)
Chloride: 105 mEq/L (ref 96–112)
HCT: 43 % (ref 39.0–52.0)
Potassium: 3.7 mEq/L (ref 3.5–5.1)

## 2012-05-08 LAB — CBC
Hemoglobin: 14.7 g/dL (ref 13.0–17.0)
MCV: 84.3 fL (ref 78.0–100.0)
Platelets: 238 10*3/uL (ref 150–400)
RBC: 4.89 MIL/uL (ref 4.22–5.81)
WBC: 4.8 10*3/uL (ref 4.0–10.5)

## 2012-05-08 LAB — URINALYSIS, ROUTINE W REFLEX MICROSCOPIC
Glucose, UA: 500 mg/dL — AB
Leukocytes, UA: NEGATIVE
Nitrite: NEGATIVE
Specific Gravity, Urine: 1.019 (ref 1.005–1.030)
pH: 5.5 (ref 5.0–8.0)

## 2012-05-08 LAB — GLUCOSE, CAPILLARY: Glucose-Capillary: 229 mg/dL — ABNORMAL HIGH (ref 70–99)

## 2012-05-08 MED ORDER — HYDROCODONE-ACETAMINOPHEN 5-325 MG PO TABS: 2.0000 | ORAL_TABLET | ORAL | Status: AC | PRN

## 2012-05-08 MED ORDER — MORPHINE SULFATE 4 MG/ML IJ SOLN
6.0000 mg | Freq: Once | INTRAMUSCULAR | Status: AC
  Administered 2012-05-08: 6 mg via INTRAVENOUS
  Filled 2012-05-08: qty 2

## 2012-05-08 MED ORDER — IOHEXOL 300 MG/ML  SOLN
100.0000 mL | Freq: Once | INTRAMUSCULAR | Status: AC | PRN
  Administered 2012-05-08: 100 mL via INTRAVENOUS

## 2012-05-08 MED ORDER — SODIUM CHLORIDE 0.9 % IV BOLUS (SEPSIS)
1000.0000 mL | Freq: Once | INTRAVENOUS | Status: AC
  Administered 2012-05-08: 1000 mL via INTRAVENOUS

## 2012-05-08 MED ORDER — HYDROCODONE-ACETAMINOPHEN 5-325 MG PO TABS
2.0000 | ORAL_TABLET | Freq: Once | ORAL | Status: AC
  Administered 2012-05-08: 2 via ORAL
  Filled 2012-05-08: qty 2

## 2012-05-08 NOTE — ED Notes (Signed)
PIV attemps x3 IV team notifiied

## 2012-05-08 NOTE — ED Notes (Signed)
Pt in from home with uncontrolled BS/back pain and abd pain states has been going on for 3 days states some nausea denies vomiting states last BS was 388 this am tx with 40 lantus and 20 nova log

## 2012-05-08 NOTE — ED Provider Notes (Addendum)
History     CSN: [id number]  Arrival date & time 05/08/12  1629   First MD Initiated Contact with Patient 05/08/12 1913      Chief Complaint  Patient presents with  . Hyperglycemia  . Back Pain  . Abdominal Pain    (Consider location/radiation/quality/duration/timing/severity/associated sxs/prior treatment) HPI   Complains of of having difficulty controlling blood blood sugars since he received colonoscopy 3 days ago. Also complains of abdominal discomfort described as a tightness, diffuse since his colonoscopy. Last bowel movement yesterday, normal no fever no vomiting patient also reports low nonradiating back pain for several days since before the colonoscopy and right shoulder pain which she's had for several years . Patient has been treating his hyperglycemia with sliding scale insulin. Associated symptoms include polydipsia and polyuria Past Medical History  Diagnosis Date  . Diabetes mellitus   . Hypercholesterolemia   . GERD (gastroesophageal reflux disease)   . Hypertension   . Hypercholesteremia     hx of  . Hemorrhoids     hx of  . Peripheral neuropathy   . Depression     Past Surgical History  Procedure Date  . Tonsillectomy     Family History  Problem Relation Age of Onset  . Hypertension Mother     History  Substance Use Topics  . Smoking status: Never Smoker   . Smokeless tobacco: Never Used  . Alcohol Use: No      Review of Systems  Constitutional: Negative.   HENT: Negative.   Respiratory: Negative.   Cardiovascular: Negative.   Gastrointestinal: Positive for abdominal pain.  Musculoskeletal: Positive for back pain and arthralgias.       Chronic right shoulder pain  Skin: Negative.   Neurological: Negative.   Hematological: Negative.   Psychiatric/Behavioral: Negative.     Allergies  Review of patient's allergies indicates no known allergies.  Home Medications   Current Outpatient Rx  Name Route Sig Dispense Refill  . ASPIRIN EC  81 MG PO TBEC Oral Take 81 mg by mouth daily.    Marland Kitchen CLONAZEPAM 2 MG PO TABS Oral Take 2 mg by mouth 2 (two) times daily as needed. For anxiety    . HYDROCODONE-ACETAMINOPHEN 5-325 MG PO TABS Oral Take 1 tablet by mouth every 6 (six) hours as needed. pain    . INSULIN GLARGINE 100 UNIT/ML [place] SOLN Subcutaneous Inject 40 Units into the skin 2 (two) times daily.    Marland Kitchen METFORMIN HCL 1000 MG PO TABS Oral Take 1,000 mg by mouth 2 (two) times daily with a meal.    . PANTOPRAZOLE SODIUM 40 MG PO TBEC Oral Take 40 mg by mouth daily.    . SERTRALINE HCL 100 MG PO TABS Oral Take 150 mg by mouth daily.    Marland Kitchen ZOLPIDEM TARTRATE 10 MG PO TABS Oral Take 10 mg by mouth at bedtime as needed. Sleep.    Marland Kitchen QUETIAPINE FUMARATE 50 MG PO TABS Oral Take 1 tablet (50 mg total) by mouth at bedtime. 30 tablet 0    BP 135/92  Pulse 83  Temp(Src) 98.1 F (36.7 C) (Oral)  Resp 18  SpO2 100%  Physical Exam  Nursing note and vitals reviewed. Constitutional: He appears well-developed and well-nourished.  HENT:  Head: Normocephalic and atraumatic.  Eyes: Conjunctivae are normal. Pupils are equal, round, and reactive to light.  Neck: Neck supple. No tracheal deviation present. No thyromegaly present.  Cardiovascular: Normal rate and regular rhythm.   No murmur heard. Pulmonary/Chest: Effort  normal and breath sounds normal.  Abdominal: Soft. Bowel sounds are normal. He exhibits no distension and no mass. There is tenderness. There is no rebound and no guarding.       Mild diffuse tenderness  Genitourinary: Penis normal.  Musculoskeletal: Normal range of motion. He exhibits no edema and no tenderness.  Neurological: He is alert. Coordination normal.  Skin: Skin is warm and dry. No rash noted.  Psychiatric: He has a normal mood and affect.    ED Course  Procedures (including critical care time)  Labs Reviewed  GLUCOSE, CAPILLARY - Abnormal; Notable for the following:    Glucose-Capillary 174 (*)    All other  components within normal limits  CBC  URINALYSIS, ROUTINE W REFLEX MICROSCOPIC   No results found. Results for orders placed during the hospital encounter of 05/08/12  GLUCOSE, CAPILLARY      Component Value Range   Glucose-Capillary 174 (*) 70 - 99 (mg/dL)  CBC      Component Value Range   WBC 4.8  4.0 - 10.5 (K/uL)   RBC 4.89  4.22 - 5.81 (MIL/uL)   Hemoglobin 14.7  13.0 - 17.0 (g/dL)   HCT 45.4  09.8 - 11.9 (%)   MCV 84.3  78.0 - 100.0 (fL)   MCH 30.1  26.0 - 34.0 (pg)   MCHC 35.7  30.0 - 36.0 (g/dL)   RDW 14.7  82.9 - 56.2 (%)   Platelets 238  150 - 400 (K/uL)  URINALYSIS, ROUTINE W REFLEX MICROSCOPIC      Component Value Range   Color, Urine YELLOW  YELLOW    APPearance CLEAR  CLEAR    Specific Gravity, Urine 1.019  1.005 - 1.030    pH 5.5  5.0 - 8.0    Glucose, UA 500 (*) NEGATIVE (mg/dL)   Hgb urine dipstick NEGATIVE  NEGATIVE    Bilirubin Urine NEGATIVE  NEGATIVE    Ketones, ur NEGATIVE  NEGATIVE (mg/dL)   Protein, ur NEGATIVE  NEGATIVE (mg/dL)   Urobilinogen, UA 0.2  0.0 - 1.0 (mg/dL)   Nitrite NEGATIVE  NEGATIVE    Leukocytes, UA NEGATIVE  NEGATIVE   POCT I-STAT, CHEM 8      Component Value Range   Sodium 141  135 - 145 (mEq/L)   Potassium 3.7  3.5 - 5.1 (mEq/L)   Chloride 105  96 - 112 (mEq/L)   BUN 9  6 - 23 (mg/dL)   Creatinine, Ser 1.30  0.50 - 1.35 (mg/dL)   Glucose, Bld 865 (*) 70 - 99 (mg/dL)   Calcium, Ion 7.84  6.96 - 1.32 (mmol/L)   TCO2 23  0 - 100 (mmol/L)   Hemoglobin 14.6  13.0 - 17.0 (g/dL)   HCT 29.5  28.4 - 13.2 (%)  GLUCOSE, CAPILLARY      Component Value Range   Glucose-Capillary 229 (*) 70 - 99 (mg/dL)   Ct Abdomen Pelvis W Contrast  05/08/2012  *RADIOLOGY REPORT*  Clinical Data: Abdominal pain after colonoscopy.  Gastroesophageal reflux disease.  Hypertension and diabetes.  CT ABDOMEN AND PELVIS WITH CONTRAST  Technique:  Multidetector CT imaging of the abdomen and pelvis was performed following the standard protocol during bolus  administration of intravenous contrast.  Contrast: OMNIPAQUE IOHEXOL 300 MG/ML  SOLN  Comparison: Abdominal ultrasound of 10/24/2010.  No prior CT.  Findings: Clear lung bases.  Normal heart size without pericardial or pleural effusion.  Mildly thick-walled distal esophagus with contrast within.  Example image 8.  A  small hiatal hernia.  Mild hepatic steatosis.  Normal spleen, distal stomach, pancreas, gallbladder, biliary tract, adrenal glands, kidneys  No retroperitoneal or retrocrural adenopathy.  The rectum appears mildly thick-walled on image 78.  This could be due to underdistension. Colonic stool burden suggests constipation.  Appendix is not visualized but there is no evidence of right lower quadrant inflammation.  Normal small bowel without abdominal ascites. No pneumatosis or free intraperitoneal air.    No pelvic adenopathy.    Normal urinary bladder and prostate.  Increased density in the subcutaneous fat of the lower abdomen may be the site of injections. No acute osseous abnormality.  IMPRESSION:  1.  Possible constipation.  No definite explanation for abdominal pain. 2.  Apparent rectal wall thickening is favored to be due to underdistension.  Mild proctitis is felt less likely. 3.  Hepatic steatosis. 4.  Small hiatal hernia with distal esophagitis and probable esophageal dysmotility.  Original Report Authenticated By: Consuello Bossier, M.D.     No diagnosis found.  11:15 PM patient feels improved after treatment with intravenous fluids and intravenous morphine. Requesting additional pain medication for shoulder pain. Norco ordered  MDM  CT scan reviewed. There is no complication of colonoscopy Plan prescription hydrocodone-A. Pap Patient is instructed to call health service tomorrow for followup Diagnosis #1 hyperglycemia #2 abdominal pain #3 chronic right shoulder pain #4 back  pain       Doug Sou, MD 05/08/12 2324  Doug Sou, MD 05/08/12 2324

## 2012-05-08 NOTE — ED Notes (Signed)
Bed:WA16<BR> Expected date:<BR> Expected time:<BR> Means of arrival:<BR> Comments:<BR> Hold for triage 4

## 2012-05-08 NOTE — ED Notes (Signed)
Pt has completed drinking CM. CT has been notified  

## 2012-05-08 NOTE — Discharge Instructions (Signed)
Hyperglycemia Take Tylenol for mild pain or the pain medicine prescribed for bad pain. Call your doctor's tomorrow for followup regarding your elevated blood sugars Hyperglycemia occurs when the glucose (sugar) in your blood is too high. Hyperglycemia can happen for many reasons, but it most often happens to people who do not know they have diabetes or are not managing their diabetes properly.  CAUSES  Whether you have diabetes or not, there are other causes of hyperglycemia. Hyperglycemia can occur when you have diabetes, but it can also occur in other situations that you might not be as aware of, such as: Diabetes  If you have diabetes and are having problems controlling your blood glucose, hyperglycemia could occur because of some of the following reasons:   Not following your meal plan.   Not taking your diabetes medications or not taking it properly.   Exercising less or doing less activity than you normally do.   Being sick.  Pre-diabetes  This cannot be ignored. Before people develop Type 2 diabetes, they almost always have "pre-diabetes." This is when your blood glucose levels are higher than normal, but not yet high enough to be diagnosed as diabetes. Research has shown that some long-term damage to the body, especially the heart and circulatory system, may already be occurring during pre-diabetes. If you take action to manage your blood glucose when you have pre-diabetes, you may delay or prevent Type 2 diabetes from developing.  Stress  If you have diabetes, you may be "diet" controlled or on oral medications or insulin to control your diabetes. However, you may find that your blood glucose is higher than usual in the hospital whether you have diabetes or not. This is often referred to as "stress hyperglycemia." Stress can elevate your blood glucose. This happens because of hormones put out by the body during times of stress. If stress has been the cause of your high blood glucose, it  can be followed regularly by your caregiver. That way he/she can make sure your hyperglycemia does not continue to get worse or progress to diabetes.  Steroids  Steroids are medications that act on the infection fighting system (immune system) to block inflammation or infection. One side effect can be a rise in blood glucose. Most people can produce enough extra insulin to allow for this rise, but for those who cannot, steroids make blood glucose levels go even higher. It is not unusual for steroid treatments to "uncover" diabetes that is developing. It is not always possible to determine if the hyperglycemia will go away after the steroids are stopped. A special blood test called an A1c is sometimes done to determine if your blood glucose was elevated before the steroids were started.  SYMPTOMS  Thirsty.   Frequent urination.   Dry mouth.   Blurred vision.   Tired or fatigue.   Weakness.   Sleepy.   Tingling in feet or leg.  DIAGNOSIS  Diagnosis is made by monitoring blood glucose in one or all of the following ways:  A1c test. This is a chemical found in your blood.   Fingerstick blood glucose monitoring.   Laboratory results.  TREATMENT  First, knowing the cause of the hyperglycemia is important before the hyperglycemia can be treated. Treatment may include, but is not be limited to:  Education.   Change or adjustment in medications.   Change or adjustment in meal plan.   Treatment for an illness, infection, etc.   More frequent blood glucose monitoring.   Change  in exercise plan.   Decreasing or stopping steroids.   Lifestyle changes.  HOME CARE INSTRUCTIONS   Test your blood glucose as directed.   Exercise regularly. Your caregiver will give you instructions about exercise. Pre-diabetes or diabetes which comes on with stress is helped by exercising.   Eat wholesome, balanced meals. Eat often and at regular, fixed times. Your caregiver or nutritionist will  give you a meal plan to guide your sugar intake.   Being at an ideal weight is important. If needed, losing as little as 10 to 15 pounds may help improve blood glucose levels.  SEEK MEDICAL CARE IF:   You have questions about medicine, activity, or diet.   You continue to have symptoms (problems such as increased thirst, urination, or weight gain).  SEEK IMMEDIATE MEDICAL CARE IF:   You are vomiting or have diarrhea.   Your breath smells fruity.   You are breathing faster or slower.   You are very sleepy or incoherent.   You have numbness, tingling, or pain in your feet or hands.   You have chest pain.   Your symptoms get worse even though you have been following your caregiver's orders.   If you have any other questions or concerns.  Document Released: 05/12/2001 Document Revised: 11/05/2011 Document Reviewed: 07/08/2009 [facility] Patient Information 2012 [place], [place].

## 2012-05-08 NOTE — ED Notes (Signed)
MD at bedside. 

## 2012-05-08 NOTE — ED Notes (Signed)
Patient transported to CT 

## 2012-05-08 NOTE — ED Notes (Signed)
Pt states he had a colonoscopy on Wed of last week and ever since, his blood sugars have been out of control. They have ranged from 200s to unreadable on his monitor. Since his sugars have been awry, he has been feeling poorly. He has night sweats, polyuria and polydipsia. He also is c/o lower back and R shoulder pain. He further states that he feels weak and tired.

## 2012-06-07 ENCOUNTER — Other Ambulatory Visit: Payer: Self-pay | Admitting: Orthopedic Surgery

## 2012-06-07 DIAGNOSIS — M542 Cervicalgia: Secondary | ICD-10-CM

## 2012-06-23 ENCOUNTER — Ambulatory Visit
Admission: RE | Admit: 2012-06-23 | Discharge: 2012-06-23 | Disposition: A | Discharge status: Home / Self Care | Payer: Medicaid Other | Source: Ambulatory Visit | Attending: Orthopedic Surgery | Admitting: Orthopedic Surgery

## 2012-06-23 DIAGNOSIS — M542 Cervicalgia: Secondary | ICD-10-CM

## 2012-06-27 ENCOUNTER — Ambulatory Visit
Admission: RE | Admit: 2012-06-27 | Discharge: 2012-06-27 | Disposition: A | Discharge status: Home / Self Care | Payer: Medicaid Other | Source: Ambulatory Visit | Attending: Orthopedic Surgery | Admitting: Orthopedic Surgery

## 2012-08-24 ENCOUNTER — Ambulatory Visit (INDEPENDENT_AMBULATORY_CARE_PROVIDER_SITE_OTHER): Payer: Self-pay | Admitting: Family Medicine

## 2012-08-24 ENCOUNTER — Encounter: Payer: Self-pay | Admitting: Family Medicine

## 2012-08-24 VITALS — BP 127/79 | HR 86 | Temp 98.2°F

## 2012-08-24 DIAGNOSIS — E1165 Type 2 diabetes mellitus with hyperglycemia: Secondary | ICD-10-CM

## 2012-08-24 LAB — POCT GLYCOSYLATED HEMOGLOBIN (HGB A1C): Hemoglobin A1C: 10

## 2012-08-24 MED ORDER — INSULIN ASPART 100 UNIT/ML ~~LOC~~ SOLN: 5.0000 [IU] | Freq: Three times a day (TID) | SUBCUTANEOUS | Status: DC

## 2012-08-24 MED ORDER — INSULIN GLARGINE 100 UNIT/ML ~~LOC~~ SOLN: 40.0000 [IU] | Freq: Two times a day (BID) | SUBCUTANEOUS | Status: DC

## 2012-08-24 NOTE — Progress Notes (Signed)
  Subjective:    Patient ID: [name] [name], male    DOB: [birth year], 56 y.o.   MRN: [id number]  HPI  Patient here to establish care and be seen for diabetes.   States that he has had a hard time affording his insulin lately and cutting back in order to stretch out what he has. CBGS 200-300 for the last few weeks. Increasing blurred vision for last few weeks, + polyphagia, + polydipsia, + polyuria.  Currently taking 30 units of lantus BID, and 2-10 of novalog before meals according to sugars.He doesn't have many strips so he's not checking often. Some numbness and tingling in feet inconsistently.   Hospitalized 5 times for DM  States his A1C has been as high as 18 over the summer.   Some chest tightness usually accompanied by sharp pain in his back, no nausea/vomiting or sweating with epsiodes, not exertional. Usually at rest and goes away on its own in minutes.   Hypoglycemic episodes  Occasionally approx weekly. Accompanied by confusion, sweats, and feeling jittery.    Reviewed medication, PMH, social Hx, and smoking history    Review of Systems C: No fevers, chills, sweats CV: No palpitations or tachycardia Resp: No SOB GI: no n/v/d MSK: + joint and muscle pains- shoulder Neuro: + numbness tingling in feet.     Objective:   Physical Exam  A1C 10.0 today  Gen: NAD, alert, cooperative with exam HEENT: NCAT, EOMI, PERRL Neck Supple, no thyromegaly, no LAD CV: RRR, good S1S2, no murmur Resp: CTABL, no wheezes, non-labored Ext: No edema,   feet: 2+ DP pulses BL, no lesions, sensation intact Neuro: Alert and oriented, No gross deficits, sensation intact on toes BL     Assessment & Plan:

## 2012-08-24 NOTE — Patient Instructions (Signed)
Thanks for coming in to meet me today!  Let me know when you can get meds and I'll end prescriptions  Lantus 30 units BID Novalog 5 units before each meal  Please come back to see me in 2-4 weeks fo follow up of diabtes

## 2012-08-24 NOTE — Assessment & Plan Note (Addendum)
Insulin dependent type 2 DM.  CBGs in 200-300 range and problems with access to insulin. Has some symptoms of hyperglycemia currently,  neuropathy, and suspicious for retinopathy.   Some lows and no fasting CBGs to base changes on.   Kept on current dose of Lantus of 30 U BID Changed novolog to 5 units pre-meal Metformin 100 qd for 2 weeks then BID (not taking but has at home)  Gave samples of both. Advised on adjustments if he starts to have lows and discussed that we will optimize his therapy when he gets his funding straightened out.   If funding doesn't come through a cheaper option will be 70/30 solution

## 2012-10-01 ENCOUNTER — Inpatient Hospital Stay (HOSPITAL_COMMUNITY)
Admission: EM | Admit: 2012-10-01 | Discharge: 2012-10-04 | DRG: 639 | Disposition: A | Discharge status: Home / Self Care | Payer: MEDICAID | Attending: Internal Medicine | Admitting: Internal Medicine

## 2012-10-01 ENCOUNTER — Observation Stay (HOSPITAL_COMMUNITY): Payer: Self-pay

## 2012-10-01 ENCOUNTER — Encounter (HOSPITAL_COMMUNITY): Payer: Self-pay | Admitting: Emergency Medicine

## 2012-10-01 DIAGNOSIS — E1110 Type 2 diabetes mellitus with ketoacidosis without coma: Secondary | ICD-10-CM

## 2012-10-01 DIAGNOSIS — D72829 Elevated white blood cell count, unspecified: Secondary | ICD-10-CM | POA: Diagnosis present

## 2012-10-01 DIAGNOSIS — G629 Polyneuropathy, unspecified: Secondary | ICD-10-CM

## 2012-10-01 DIAGNOSIS — Z91199 Patient's noncompliance with other medical treatment and regimen due to unspecified reason: Secondary | ICD-10-CM

## 2012-10-01 DIAGNOSIS — E1149 Type 2 diabetes mellitus with other diabetic neurological complication: Secondary | ICD-10-CM | POA: Diagnosis present

## 2012-10-01 DIAGNOSIS — K219 Gastro-esophageal reflux disease without esophagitis: Secondary | ICD-10-CM

## 2012-10-01 DIAGNOSIS — IMO0002 Reserved for concepts with insufficient information to code with codable children: Secondary | ICD-10-CM

## 2012-10-01 DIAGNOSIS — F3289 Other specified depressive episodes: Secondary | ICD-10-CM | POA: Diagnosis present

## 2012-10-01 DIAGNOSIS — E1310 Other specified diabetes mellitus with ketoacidosis without coma: Principal | ICD-10-CM | POA: Diagnosis present

## 2012-10-01 DIAGNOSIS — Z9119 Patient's noncompliance with other medical treatment and regimen: Secondary | ICD-10-CM

## 2012-10-01 DIAGNOSIS — E118 Type 2 diabetes mellitus with unspecified complications: Secondary | ICD-10-CM

## 2012-10-01 DIAGNOSIS — E1142 Type 2 diabetes mellitus with diabetic polyneuropathy: Secondary | ICD-10-CM | POA: Diagnosis present

## 2012-10-01 DIAGNOSIS — F32A Depression, unspecified: Secondary | ICD-10-CM

## 2012-10-01 DIAGNOSIS — E1165 Type 2 diabetes mellitus with hyperglycemia: Secondary | ICD-10-CM | POA: Diagnosis present

## 2012-10-01 DIAGNOSIS — F329 Major depressive disorder, single episode, unspecified: Secondary | ICD-10-CM | POA: Diagnosis present

## 2012-10-01 DIAGNOSIS — E875 Hyperkalemia: Secondary | ICD-10-CM

## 2012-10-01 DIAGNOSIS — E876 Hypokalemia: Secondary | ICD-10-CM | POA: Diagnosis not present

## 2012-10-01 LAB — CBC WITH DIFFERENTIAL/PLATELET
Basophils Absolute: 0 10*3/uL (ref 0.0–0.1)
Eosinophils Relative: 0 % (ref 0–5)
Lymphocytes Relative: 18 % (ref 12–46)
MCV: 84 fL (ref 78.0–100.0)
Neutro Abs: 7.3 10*3/uL (ref 1.7–7.7)
Neutrophils Relative %: 78 % — ABNORMAL HIGH (ref 43–77)
Platelets: 290 10*3/uL (ref 150–400)
RDW: 12.7 % (ref 11.5–15.5)
WBC: 9.3 10*3/uL (ref 4.0–10.5)

## 2012-10-01 LAB — URINALYSIS, ROUTINE W REFLEX MICROSCOPIC
Glucose, UA: >1000 mg/dL — AB
Ketones, ur: >80 mg/dL — AB
Leukocytes, UA: NEGATIVE
Leukocytes, UA: NEGATIVE
Nitrite: NEGATIVE
Protein, ur: NEGATIVE mg/dL
Protein, ur: NEGATIVE mg/dL
Urobilinogen, UA: 0.2 mg/dL (ref 0.0–1.0)

## 2012-10-01 LAB — BASIC METABOLIC PANEL
BUN: 26 mg/dL — ABNORMAL HIGH (ref 6–23)
BUN: 23 mg/dL (ref 6–23)
BUN: 20 mg/dL (ref 6–23)
CO2: 11 mEq/L — ABNORMAL LOW (ref 19–32)
CO2: 13 mEq/L — ABNORMAL LOW (ref 19–32)
Calcium: 9.9 mg/dL (ref 8.4–10.5)
Calcium: 9.4 mg/dL (ref 8.4–10.5)
Calcium: 9 mg/dL (ref 8.4–10.5)
Calcium: 8.9 mg/dL (ref 8.4–10.5)
Creatinine, Ser: 1.55 mg/dL — ABNORMAL HIGH (ref 0.50–1.35)
Creatinine, Ser: 1.48 mg/dL — ABNORMAL HIGH (ref 0.50–1.35)
Creatinine, Ser: 1.23 mg/dL (ref 0.50–1.35)
Creatinine, Ser: 1.19 mg/dL (ref 0.50–1.35)
GFR calc Af Amer: 57 mL/min — ABNORMAL LOW (ref >90)
GFR calc Af Amer: 60 mL/min — ABNORMAL LOW (ref >90)
GFR calc non Af Amer: 49 mL/min — ABNORMAL LOW (ref >90)
GFR calc non Af Amer: 65 mL/min — ABNORMAL LOW (ref >90)
GFR calc non Af Amer: 68 mL/min — ABNORMAL LOW (ref >90)
Glucose, Bld: 160 mg/dL — ABNORMAL HIGH (ref 70–99)
Glucose, Bld: 110 mg/dL — ABNORMAL HIGH (ref 70–99)
Potassium: 5.8 mEq/L — ABNORMAL HIGH (ref 3.5–5.1)
Potassium: 4.9 mEq/L (ref 3.5–5.1)
Sodium: 133 mEq/L — ABNORMAL LOW (ref 135–145)
Sodium: 139 mEq/L (ref 135–145)

## 2012-10-01 LAB — GLUCOSE, CAPILLARY
Glucose-Capillary: 532 mg/dL — ABNORMAL HIGH (ref 70–99)
Glucose-Capillary: 494 mg/dL — ABNORMAL HIGH (ref 70–99)
Glucose-Capillary: 433 mg/dL — ABNORMAL HIGH (ref 70–99)
Glucose-Capillary: 269 mg/dL — ABNORMAL HIGH (ref 70–99)
Glucose-Capillary: 233 mg/dL — ABNORMAL HIGH (ref 70–99)
Glucose-Capillary: 192 mg/dL — ABNORMAL HIGH (ref 70–99)
Glucose-Capillary: 162 mg/dL — ABNORMAL HIGH (ref 70–99)
Glucose-Capillary: 144 mg/dL — ABNORMAL HIGH (ref 70–99)
Glucose-Capillary: 110 mg/dL — ABNORMAL HIGH (ref 70–99)
Glucose-Capillary: 107 mg/dL — ABNORMAL HIGH (ref 70–99)
Glucose-Capillary: 105 mg/dL — ABNORMAL HIGH (ref 70–99)
Glucose-Capillary: 122 mg/dL — ABNORMAL HIGH (ref 70–99)

## 2012-10-01 LAB — CBC
MCH: 29.9 pg (ref 26.0–34.0)
Platelets: 300 10*3/uL (ref 150–400)
RBC: 5.15 MIL/uL (ref 4.22–5.81)
RDW: 12.7 % (ref 11.5–15.5)
WBC: 22.5 10*3/uL — ABNORMAL HIGH (ref 4.0–10.5)

## 2012-10-01 LAB — BLOOD GAS, ARTERIAL
Acid-base deficit: 22.7 mmol/L — ABNORMAL HIGH (ref 0.0–2.0)
Drawn by: 336861
Patient temperature: 98.6
TCO2: 4.9 mmol/L (ref 0–100)

## 2012-10-01 LAB — RAPID URINE DRUG SCREEN, HOSP PERFORMED
Amphetamines: NOT DETECTED
Opiates: NOT DETECTED

## 2012-10-01 LAB — HEMOGLOBIN A1C: Hgb A1c MFr Bld: 11.1 % — ABNORMAL HIGH (ref <5.7)

## 2012-10-01 LAB — URINE MICROSCOPIC-ADD ON

## 2012-10-01 LAB — ETHANOL: Alcohol, Ethyl (B): <11 mg/dL (ref 0–11)

## 2012-10-01 MED ORDER — HYDROCODONE-ACETAMINOPHEN 5-325 MG PO TABS
1.0000 | ORAL_TABLET | ORAL | Status: DC | PRN
  Administered 2012-10-01 – 2012-10-04 (×8): 2 via ORAL
  Filled 2012-10-01 (×6): qty 2
  Filled 2012-10-01: qty 1
  Filled 2012-10-01: qty 2
  Filled 2012-10-01: qty 1
  Filled 2012-10-01: qty 2
  Filled 2012-10-01: qty 1

## 2012-10-01 MED ORDER — PREGABALIN 75 MG PO CAPS
150.0000 mg | ORAL_CAPSULE | Freq: Two times a day (BID) | ORAL | Status: DC
  Administered 2012-10-01 – 2012-10-02 (×2): 150 mg via ORAL
  Filled 2012-10-01: qty 3
  Filled 2012-10-01: qty 2
  Filled 2012-10-01: qty 3
  Filled 2012-10-01 (×2): qty 1

## 2012-10-01 MED ORDER — ACETAMINOPHEN 325 MG PO TABS: 650.0000 mg | ORAL_TABLET | ORAL | Status: DC | PRN

## 2012-10-01 MED ORDER — SODIUM CHLORIDE 0.9 % IV SOLN
INTRAVENOUS | Status: AC
  Administered 2012-10-01: 10:00:00 via INTRAVENOUS

## 2012-10-01 MED ORDER — ACETAMINOPHEN 325 MG PO TABS: 650.0000 mg | ORAL_TABLET | Freq: Four times a day (QID) | ORAL | Status: DC | PRN

## 2012-10-01 MED ORDER — ONDANSETRON HCL 4 MG/2ML IJ SOLN
4.0000 mg | Freq: Three times a day (TID) | INTRAMUSCULAR | Status: DC | PRN
  Filled 2012-10-01: qty 2

## 2012-10-01 MED ORDER — SODIUM CHLORIDE 0.9 % IV SOLN
INTRAVENOUS | Status: DC
  Administered 2012-10-01: 4.7 [IU]/h via INTRAVENOUS
  Filled 2012-10-01: qty 1

## 2012-10-01 MED ORDER — SODIUM CHLORIDE 0.9 % IV SOLN
1000.0000 mL | INTRAVENOUS | Status: DC
  Administered 2012-10-01: 1000 mL via INTRAVENOUS

## 2012-10-01 MED ORDER — ZOLPIDEM TARTRATE 10 MG PO TABS: 10.0000 mg | ORAL_TABLET | Freq: Every evening | ORAL | Status: DC | PRN

## 2012-10-01 MED ORDER — SODIUM CHLORIDE 0.9 % IJ SOLN
3.0000 mL | Freq: Two times a day (BID) | INTRAMUSCULAR | Status: DC
  Administered 2012-10-01 – 2012-10-02 (×4): 3 mL via INTRAVENOUS

## 2012-10-01 MED ORDER — ACETAMINOPHEN 650 MG RE SUPP: 650.0000 mg | Freq: Four times a day (QID) | RECTAL | Status: DC | PRN

## 2012-10-01 MED ORDER — PANTOPRAZOLE SODIUM 40 MG IV SOLR
40.0000 mg | INTRAVENOUS | Status: DC
  Administered 2012-10-01 – 2012-10-03 (×3): 40 mg via INTRAVENOUS
  Filled 2012-10-01 (×4): qty 40

## 2012-10-01 MED ORDER — SODIUM CHLORIDE 0.9 % IV SOLN
INTRAVENOUS | Status: DC
  Administered 2012-10-01: 10:00:00 via INTRAVENOUS

## 2012-10-01 MED ORDER — DEXTROSE-NACL 5-0.45 % IV SOLN: INTRAVENOUS | Status: DC

## 2012-10-01 MED ORDER — SODIUM CHLORIDE 0.9 % IV SOLN: 1000.0000 mL | Freq: Once | INTRAVENOUS | Status: DC

## 2012-10-01 MED ORDER — CLONAZEPAM 1 MG PO TABS: 2.0000 mg | ORAL_TABLET | Freq: Two times a day (BID) | ORAL | Status: DC | PRN

## 2012-10-01 MED ORDER — SODIUM CHLORIDE 0.9 % IV SOLN
INTRAVENOUS | Status: DC
  Administered 2012-10-01: 1 [IU]/h via INTRAVENOUS
  Filled 2012-10-01 (×2): qty 1

## 2012-10-01 MED ORDER — ZOLPIDEM TARTRATE 5 MG PO TABS: 5.0000 mg | ORAL_TABLET | Freq: Every evening | ORAL | Status: DC | PRN

## 2012-10-01 MED ORDER — SODIUM CHLORIDE 0.9 % IV SOLN: INTRAVENOUS | Status: AC

## 2012-10-01 MED ORDER — DEXTROSE 50 % IV SOLN: 25.0000 mL | INTRAVENOUS | Status: DC | PRN

## 2012-10-01 MED ORDER — DEXTROSE-NACL 5-0.45 % IV SOLN
INTRAVENOUS | Status: DC
  Administered 2012-10-01 – 2012-10-03 (×3): via INTRAVENOUS

## 2012-10-01 MED ORDER — PREGABALIN 50 MG PO CAPS: 150.0000 mg | ORAL_CAPSULE | Freq: Two times a day (BID) | ORAL | Status: DC

## 2012-10-01 MED ORDER — PNEUMOCOCCAL VAC POLYVALENT 25 MCG/0.5ML IJ INJ
0.5000 mL | INJECTION | INTRAMUSCULAR | Status: AC
  Administered 2012-10-02: 0.5 mL via INTRAMUSCULAR
  Filled 2012-10-01: qty 0.5

## 2012-10-01 MED ORDER — GI COCKTAIL ~~LOC~~
30.0000 mL | Freq: Two times a day (BID) | ORAL | Status: DC | PRN
  Administered 2012-10-01: 30 mL via ORAL
  Filled 2012-10-01: qty 30

## 2012-10-01 MED ORDER — ASPIRIN EC 81 MG PO TBEC
81.0000 mg | DELAYED_RELEASE_TABLET | Freq: Every day | ORAL | Status: DC
  Administered 2012-10-01 – 2012-10-04 (×4): 81 mg via ORAL
  Filled 2012-10-01 (×4): qty 1

## 2012-10-01 MED ORDER — BUPROPION HCL ER (SR) 150 MG PO TB12
150.0000 mg | ORAL_TABLET | Freq: Every day | ORAL | Status: DC
  Administered 2012-10-01 – 2012-10-04 (×3): 150 mg via ORAL
  Filled 2012-10-01 (×4): qty 1

## 2012-10-01 MED ORDER — ONDANSETRON HCL 4 MG/2ML IJ SOLN: 4.0000 mg | Freq: Three times a day (TID) | INTRAMUSCULAR | Status: AC | PRN

## 2012-10-01 MED ORDER — QUETIAPINE FUMARATE 50 MG PO TABS
50.0000 mg | ORAL_TABLET | Freq: Every day | ORAL | Status: DC
  Administered 2012-10-02: 50 mg via ORAL
  Filled 2012-10-01 (×5): qty 1

## 2012-10-01 MED ORDER — INFLUENZA VIRUS VACC SPLIT PF IM SUSP
0.5000 mL | INTRAMUSCULAR | Status: AC
  Administered 2012-10-02: 0.5 mL via INTRAMUSCULAR
  Filled 2012-10-01: qty 0.5

## 2012-10-01 MED ORDER — MORPHINE SULFATE 2 MG/ML IJ SOLN
1.0000 mg | INTRAMUSCULAR | Status: DC | PRN
  Administered 2012-10-01: 1 mg via INTRAVENOUS
  Filled 2012-10-01: qty 1

## 2012-10-01 NOTE — ED Notes (Signed)
EKG printed and given to EDP Ray for review. Last confirmed printed for comparison

## 2012-10-01 NOTE — ED Provider Notes (Addendum)
History     CSN: [id number]  Arrival date & time 10/01/12  0227   First MD Initiated Contact with Patient 10/01/12 [phone]      Chief Complaint  Patient presents with  . Hyperglycemia    (Consider location/radiation/quality/duration/timing/severity/associated sxs/prior treatment) HPI Patient complaining of elevated bs arrived by pv.  Patient states he has had uri symptoms for 5 days.  Felt wam, runny nose congestion, sore throat, nonproductive cough, but sob, nonsmoker.  Patient did not see his doctor thiss week.  BS increased to 500 to 600 since Tuesday.  Patient came in tonight secondary to vomiting and weakness.   Past Medical History  Diagnosis Date  . Diabetes mellitus   . Hypercholesterolemia   . GERD (gastroesophageal reflux disease)   . Hypertension   . Hypercholesteremia     hx of  . Hemorrhoids     hx of  . Peripheral neuropathy   . Depression     Past Surgical History  Procedure Date  . Tonsillectomy     Family History  Problem Relation Age of Onset  . Hypertension Mother   . Alzheimer's disease Mother   . Alcohol abuse Father   . Diabetes type II Brother     History  Substance Use Topics  . Smoking status: Former Smoker -- 0.2 packs/day for 3 years    Quit date: 11/30/1998  . Smokeless tobacco: Never Used  . Alcohol Use: 0.5 oz/week    1 drink(s) per week      Review of Systems  Constitutional: Negative for fever and chills.  HENT: Negative for neck stiffness.   Eyes: Negative for visual disturbance.  Respiratory: Negative for shortness of breath.   Cardiovascular: Negative for chest pain.  Gastrointestinal: Negative for vomiting, diarrhea and blood in stool.  Genitourinary: Negative for dysuria, frequency and decreased urine volume.  Musculoskeletal: Negative for myalgias and joint swelling.  Skin: Negative for rash.  Neurological: Negative for weakness.  Hematological: Negative for adenopathy.  Psychiatric/Behavioral: Negative for  agitation.    Allergies  Review of patient's allergies indicates no known allergies.  Home Medications   Current Outpatient Rx  Name Route Sig Dispense Refill  . ASPIRIN EC 81 MG PO TBEC Oral Take 81 mg by mouth daily.    . BUPROPION HCL ER (SR) 150 MG PO TB12 Oral Take 150 mg by mouth daily.    Marland Kitchen CLONAZEPAM 2 MG PO TABS Oral Take 2 mg by mouth 2 (two) times daily as needed. For anxiety    . HYDROCODONE-ACETAMINOPHEN 5-325 MG PO TABS Oral Take 1 tablet by mouth every 6 (six) hours as needed. pain    . INSULIN ASPART 100 UNIT/ML [place] SOLN Subcutaneous Inject 5 Units into the skin 3 (three) times daily before meals. 1 vial 12  . INSULIN GLARGINE 100 UNIT/ML [place] SOLN Subcutaneous Inject 40 Units into the skin 2 (two) times daily. 30 mL 0  . METFORMIN HCL 1000 MG PO TABS Oral Take 1,000 mg by mouth 2 (two) times daily with a meal.    . PANTOPRAZOLE SODIUM 40 MG PO TBEC Oral Take 40 mg by mouth daily.    Marland Kitchen PREGABALIN 150 MG PO CAPS Oral Take 150 mg by mouth 2 (two) times daily.    Marland Kitchen ZOLPIDEM TARTRATE 10 MG PO TABS Oral Take 10 mg by mouth at bedtime as needed. Sleep.    Marland Kitchen QUETIAPINE FUMARATE 50 MG PO TABS Oral Take 1 tablet (50 mg total) by mouth at bedtime. 30  tablet 0    BP 104/59  Pulse 112  Temp 98 F (36.7 C)  Resp 16  Wt 210 lb (95.255 kg)  SpO2 99%  Physical Exam  Nursing note and vitals reviewed. Constitutional: He is oriented to person, place, and time. He appears well-developed and well-nourished.  HENT:  Head: Normocephalic and atraumatic.  Eyes: Conjunctivae normal are normal. Pupils are equal, round, and reactive to light.  Neck: Normal range of motion. Neck supple.  Cardiovascular: Tachycardia present.   Pulmonary/Chest: Tachypnea noted.  Abdominal: Soft. Bowel sounds are normal.  Musculoskeletal: Normal range of motion.  Neurological: He is alert and oriented to person, place, and time.  Skin: Skin is warm and dry.  Psychiatric: He has a normal mood and affect.     ED Course  Procedures (including critical care time)  Labs Reviewed  GLUCOSE, CAPILLARY - Abnormal; Notable for the following:    Glucose-Capillary 532 (*)     All other components within normal limits  CBC WITH DIFFERENTIAL - Abnormal; Notable for the following:    Neutrophils Relative 78 (*)     All other components within normal limits  BASIC METABOLIC PANEL - Abnormal; Notable for the following:    Sodium 133 (*)     Potassium 5.8 (*)     CO2 11 (*)     Glucose, Bld 603 (*)     GFR calc non Af Amer 62 (*)     GFR calc Af Amer 72 (*)     All other components within normal limits  URINALYSIS, ROUTINE W REFLEX MICROSCOPIC - Abnormal; Notable for the following:    Glucose, UA >1000 (*)     Ketones, ur >80 (*)     All other components within normal limits  URINE MICROSCOPIC-ADD ON  BASIC METABOLIC PANEL  BASIC METABOLIC PANEL  BASIC METABOLIC PANEL  BASIC METABOLIC PANEL  BASIC METABOLIC PANEL   No results found.   No diagnosis found.  Results for orders placed during the hospital encounter of 10/01/12  GLUCOSE, CAPILLARY      Component Value Range   Glucose-Capillary 532 (*) 70 - 99 mg/dL   Comment 1 Notify RN    CBC WITH DIFFERENTIAL      Component Value Range   WBC 9.3  4.0 - 10.5 K/uL   RBC 5.43  4.22 - 5.81 MIL/uL   Hemoglobin 16.3  13.0 - 17.0 g/dL   HCT 16.1  09.6 - 04.5 %   MCV 84.0  78.0 - 100.0 fL   MCH 30.0  26.0 - 34.0 pg   MCHC 35.7  30.0 - 36.0 g/dL   RDW 40.9  81.1 - 91.4 %   Platelets 290  150 - 400 K/uL   Neutrophils Relative 78 (*) 43 - 77 %   Neutro Abs 7.3  1.7 - 7.7 K/uL   Lymphocytes Relative 18  12 - 46 %   Lymphs Abs 1.7  0.7 - 4.0 K/uL   Monocytes Relative 3  3 - 12 %   Monocytes Absolute 0.2  0.1 - 1.0 K/uL   Eosinophils Relative 0  0 - 5 %   Eosinophils Absolute 0.0  0.0 - 0.7 K/uL   Basophils Relative 0  0 - 1 %   Basophils Absolute 0.0  0.0 - 0.1 K/uL  BASIC METABOLIC PANEL      Component Value Range   Sodium 133 (*) 135  - 145 mEq/L   Potassium 5.8 (*) 3.5 - 5.1 mEq/L  Chloride 96  96 - 112 mEq/L   CO2 11 (*) 19 - 32 mEq/L   Glucose, Bld 603 (*) 70 - 99 mg/dL   BUN 22  6 - 23 mg/dL   Creatinine, Ser 1.61  0.50 - 1.35 mg/dL   Calcium 9.9  8.4 - 09.6 mg/dL   GFR calc non Af Amer 62 (*) >90 mL/min   GFR calc Af Amer 72 (*) >90 mL/min  URINALYSIS, ROUTINE W REFLEX MICROSCOPIC      Component Value Range   Color, Urine YELLOW  YELLOW   APPearance CLEAR  CLEAR   Specific Gravity, Urine 1.030  1.005 - 1.030   pH 5.5  5.0 - 8.0   Glucose, UA >1000 (*) NEGATIVE mg/dL   Hgb urine dipstick NEGATIVE  NEGATIVE   Bilirubin Urine NEGATIVE  NEGATIVE   Ketones, ur >80 (*) NEGATIVE mg/dL   Protein, ur NEGATIVE  NEGATIVE mg/dL   Urobilinogen, UA 0.2  0.0 - 1.0 mg/dL   Nitrite NEGATIVE  NEGATIVE   Leukocytes, UA NEGATIVE  NEGATIVE  URINE MICROSCOPIC-ADD ON      Component Value Range   Squamous Epithelial / LPF RARE  RARE  BLOOD GAS, ARTERIAL      Component Value Range   FIO2 0.21     pH, Arterial 7.163 (*) 7.350 - 7.450   pCO2 arterial 15.7 (*) 35.0 - 45.0 mmHg   pO2, Arterial 120.0 (*) 80.0 - 100.0 mmHg   Bicarbonate 5.4 (*) 20.0 - 24.0 mEq/L   TCO2 4.9  0 - 100 mmol/L   Acid-base deficit 22.7 (*) 0.0 - 2.0 mmol/L   O2 Saturation 97.2     Patient temperature 98.6     Collection site LEFT RADIAL     Drawn by [id number]     Sample type ARTERIAL DRAW     Allens test (pass/fail) PASS  PASS     MDM  Patient with anion gap 26 and ph of 7.16.  He is on glucose stabilizer and has received iv fluids.  He is tachycardiac but ow hemodynamically stable.   Date: 10/01/2012  Rate: 117  Rhythm: sinus tachycardia  QRS Axis: normal  Intervals: normal  ST/T Wave abnormalities: normal  Conduction Disutrbances:none  Narrative Interpretation:   Old EKG Reviewed: rate increased by 11 b/m  Patient with chest x-Jadier Rockers pending. Patient's care discussed with Dr. Elisabeth Pigeon. Patient to be placed instead down unit team  1.   CRITICAL CARE Performed by: Hilario Quarry   Total critical care time: 60  Critical care time was exclusive of separately billable procedures and treating other patients.  Critical care was necessary to treat or prevent imminent or life-threatening deterioration.  Critical care was time spent personally by me on the following activities: development of treatment plan with patient and/or surrogate as well as nursing, discussions with consultants, evaluation of patient's response to treatment, examination of patient, obtaining history from patient or surrogate, ordering and performing treatments and interventions, ordering and review of laboratory studies, ordering and review of radiographic studies, pulse oximetry and re-evaluation of patient's condition.  Diagnosis DKA    Hilario Quarry, MD 10/01/12 8119  Hilario Quarry, MD 10/01/12 [id number]

## 2012-10-01 NOTE — H&P (Addendum)
[facility] History and Physical  [name] [name] RUE:[id number] DOB: 08/21/1956 DOA: 10/01/2012  Referring physician: ER physician PCP: Baden Fenton, MD   Chief Complaint: nausea and vomiting  HPI:  56 year old male with past medical history of diabetes, depression and GERD who presented to ED with complaints of feeling weak for past few days. Patient reports having intractable nausea and vomiting since last night associated with intermittent mid abdominal pain, about 5/10 in intensity, non radiating, cramp like. Patient reports poor oral intake for past 1-2 days. No shortness of breath but patient does report occasional nonproductive cough and subjective fever at home. No lightheadedness or dizziness or loss of consciousness. No reports of diarrhea or blood in stool or urine.  Assessment and Plan:  Principal Problem:  *DKA, type 2  Perhaps due to non compliance  No evidence of infectious source as a provoking event for DKA ( no fever in ED, no elevated WBC, CXR with no acute disease)  check UDS and ethanol level  Start insulin drip and labs per DKA protocol  Check A1c  We will hold home insulin regimen and metformin  Active Problems:  DM (diabetes mellitus), type 2, uncontrolled with complications  Appreciate diabetic coordinator input  Nutrition education   Anxiety and Depression  Continue Klonopin, Wellbutrin and Seroquel per home regimen   GERD (gastroesophageal reflux disease)  protonix 40 mg IV   Hyperkalemia  Repeat BMP to make sure this is not a lab error  If still elevated will give kayexalate   Peripheral neuropathy  Continue Lyrica  Code Status: Full Family Communication: Pt at bedside Disposition Plan: Admit for further evaluation  Manson Passey, MD  [facility] Pager [phone]  If 7PM-7AM, please contact night-coverage [email or web address] Password [email or web address] 10/01/2012, 8:10 AM  Review of Systems:  Constitutional: Negative for fever, chills and  malaise/fatigue. Negative for diaphoresis.  HENT: Negative for hearing loss, ear pain, nosebleeds, congestion, sore throat, neck pain, tinnitus and ear discharge.   Eyes: Negative for blurred vision, double vision, photophobia, pain, discharge and redness.  Respiratory: positive for cough, negative for hemoptysis, sputum production, shortness of breath, wheezing and stridor.   Cardiovascular: Negative for chest pain, palpitations, orthopnea, claudication and leg swelling.  Gastrointestinal: positive for nausea, vomiting and abdominal pain. Negative for heartburn, constipation, blood in stool and melena.  Genitourinary: Negative for dysuria, urgency, frequency, hematuria and flank pain.  Musculoskeletal: Negative for myalgias, back pain, joint pain and falls.  Skin: Negative for itching and rash.  Neurological: Negative for dizziness and weakness. Negative for tingling, tremors, sensory change, speech change, focal weakness, loss of consciousness and headaches.  Endo/Heme/Allergies: Negative for environmental allergies and polydipsia. Does not bruise/bleed easily.  Psychiatric/Behavioral: Negative for suicidal ideas. The patient is not nervous/anxious.      Past Medical History  Diagnosis Date  . Diabetes mellitus   . Hypercholesterolemia   . GERD (gastroesophageal reflux disease)   . Hypertension   . Hypercholesteremia     hx of  . Hemorrhoids     hx of  . Peripheral neuropathy   . Depression    Past Surgical History  Procedure Date  . Tonsillectomy    Social History:  reports that he quit smoking about 13 years ago. He has never used smokeless tobacco. He reports that he drinks about .5 ounces of alcohol per week. His drug history not on file.  No Known Allergies  Family History: hypothyroidism in mother  Prior to Admission medications   Medication Sig Start  Date End Date Taking? Authorizing Provider  aspirin EC 81 MG tablet Take 81 mg by mouth daily.   Yes Historical  Provider, MD  buPROPion (WELLBUTRIN SR) 150 MG 12 hr tablet Take 150 mg by mouth daily.   Yes Historical Provider, MD  clonazePAM (KLONOPIN) 2 MG tablet Take 2 mg by mouth 2 (two) times daily as needed. For anxiety   Yes Historical Provider, MD  HYDROcodone-acetaminophen (NORCO) 5-325 MG per tablet Take 1 tablet by mouth every 6 (six) hours as needed. pain 04/09/12  Yes Hollice Espy, MD  insulin aspart (NOVOLOG FLEXPEN) 100 UNIT/ML injection Inject 5 Units into the skin 3 (three) times daily before meals. 08/24/12  Yes Elenora Gamma, MD  insulin glargine (LANTUS) 100 UNIT/ML injection Inject 40 Units into the skin 2 (two) times daily. 08/24/12 08/24/13 Yes Elenora Gamma, MD  metFORMIN (GLUCOPHAGE) 1000 MG tablet Take 1,000 mg by mouth 2 (two) times daily with a meal.   Yes Historical Provider, MD  pantoprazole (PROTONIX) 40 MG tablet Take 40 mg by mouth daily. 12/26/11  Yes Srikar Cherlynn Kaiser, MD  pregabalin (LYRICA) 150 MG capsule Take 150 mg by mouth 2 (two) times daily.   Yes Historical Provider, MD  zolpidem (AMBIEN) 10 MG tablet Take 10 mg by mouth at bedtime as needed. Sleep.   Yes Historical Provider, MD  QUEtiapine (SEROQUEL) 50 MG tablet Take 1 tablet (50 mg total) by mouth at bedtime. 01/13/12 02/12/12  Edsel Petrin, DO   Physical Exam: Filed Vitals:   10/01/12 0231 10/01/12 0730  BP: 104/59 136/63  Pulse: 112 104  Temp: 98 F (36.7 C)   Resp: 16 28  Weight: 95.255 kg (210 lb)   SpO2: 99%     Physical Exam  Constitutional: Appears well-developed and well-nourished. No distress.  HENT: Normocephalic. External right and left ear normal. Oropharynx is clear and moist.  Eyes: Conjunctivae and EOM are normal. PERRLA, no scleral icterus.  Neck: Normal ROM. Neck supple. No JVD. No tracheal deviation. No thyromegaly.  CVS: RRR, S1/S2 +, no murmurs, no gallops, no carotid bruit.  Pulmonary: Effort and breath sounds normal, no stridor, rhonchi, wheezes, rales.  Abdominal:  Soft. BS +,  no distension, some tenderness across mid abdomen, no rebound or guarding.  Musculoskeletal: Normal range of motion. No edema and no tenderness.  Lymphadenopathy: No lymphadenopathy noted, cervical, inguinal. Neuro: Alert. Normal reflexes, muscle tone coordination. No cranial nerve deficit. Skin: Skin is warm and dry. No rash noted. Not diaphoretic. No erythema. No pallor.  Psychiatric: Normal mood and affect. Behavior, judgment, thought content normal.   Labs on Admission:  Basic Metabolic Panel:  Lab 10/01/12 1610  NA 133*  K 5.8*  CL 96  CO2 11*  GLUCOSE 603*  BUN 22  CREATININE 1.27  CALCIUM 9.9   CBC:  Lab 10/01/12 0320  WBC 9.3  NEUTROABS 7.3  HGB 16.3  HCT 45.6  MCV 84.0  PLT 290   CBG:  Lab 10/01/12 0230  GLUCAP 532*    Radiological Exams on Admission: Dg Chest Port 1 View 10/01/2012  * IMPRESSION: No active disease.   Original Report Authenticated By: Myles Rosenthal, M.D.     EKG: Normal sinus rhythm, no ST/T wave changes  Time spent: 85 minutes

## 2012-10-01 NOTE — Progress Notes (Signed)
Utilization review completed.  

## 2012-10-01 NOTE — Progress Notes (Signed)
CRITICAL VALUE ALERT  Critical value received:  CO2 7  Date of notification:  10/01/12  Time of notification:  11:00  Critical value read back:yes  Nurse who received alert:  Fabian November, RN  MD notified (1st page):  Elisabeth Pigeon  Time of first page:  11:02  MD notified (2nd page):  Time of second page:  Responding MD:  Elisabeth Pigeon  Time MD responded:  11:05

## 2012-10-01 NOTE — ED Notes (Signed)
Pt alert, arrives from home, c/o high BS, onset was a few days ago, per family recent URI, resp even unlabored, skin pwd, speech clear

## 2012-10-01 NOTE — Care Management Note (Signed)
    Page 1 of 1   10/01/2012     1:44:20 PM   CARE MANAGEMENT NOTE 10/01/2012  Patient:  Rilling,Avyay   Account Number:  [id number]  Date Initiated:  10/01/2012  Documentation initiated by:  Tera Mater  Subjective/Objective Assessment:   56yo male admitted with Nausea and vomitting.     Action/Plan:   medication assistance   Anticipated DC Date:  10/04/2012   Anticipated DC Plan:  HOME/SELF CARE      DC Planning Services  CM consult  Medication Assistance      Choice offered to / List presented to:             Status of service:  In process, will continue to follow Medicare Important Message given?   (If response is "NO", the following Medicare IM given date fields will be blank) Date Medicare IM given:   Date Additional Medicare IM given:    Discharge Disposition:    Per UR Regulation:  Reviewed for med. necessity/level of care/duration of stay  If discussed at Long Length of Stay Meetings, dates discussed:    Comments:  10/01/12 2341 Noted CM referral for medication assistance. This NCM called pharmacy to inquire of indigent fund eligibility.  Pt. is eligible. Physician, please write 3 day rx for dc medications, and if pt. has antibiotic rx, we can fill entire rx.  NCM to follow.  Tera Mater, RN, BSN NCM

## 2012-10-01 NOTE — ED Notes (Signed)
Potable chest  X-ray.

## 2012-10-02 DIAGNOSIS — E118 Type 2 diabetes mellitus with unspecified complications: Secondary | ICD-10-CM

## 2012-10-02 DIAGNOSIS — E1165 Type 2 diabetes mellitus with hyperglycemia: Secondary | ICD-10-CM

## 2012-10-02 LAB — CBC
HCT: 39.5 % (ref 39.0–52.0)
Hemoglobin: 14 g/dL (ref 13.0–17.0)
MCH: 29.6 pg (ref 26.0–34.0)
MCHC: 35.4 g/dL (ref 30.0–36.0)
RDW: 12.9 % (ref 11.5–15.5)

## 2012-10-02 LAB — BASIC METABOLIC PANEL
BUN: 15 mg/dL (ref 6–23)
BUN: 16 mg/dL (ref 6–23)
BUN: 18 mg/dL (ref 6–23)
BUN: 19 mg/dL (ref 6–23)
CO2: 14 mEq/L — ABNORMAL LOW (ref 19–32)
CO2: 16 mEq/L — ABNORMAL LOW (ref 19–32)
Calcium: 8.3 mg/dL — ABNORMAL LOW (ref 8.4–10.5)
Calcium: 8.7 mg/dL (ref 8.4–10.5)
Calcium: 8.8 mg/dL (ref 8.4–10.5)
Calcium: 8.9 mg/dL (ref 8.4–10.5)
Creatinine, Ser: 0.95 mg/dL (ref 0.50–1.35)
Creatinine, Ser: 1 mg/dL (ref 0.50–1.35)
Creatinine, Ser: 1.04 mg/dL (ref 0.50–1.35)
Creatinine, Ser: 1.16 mg/dL (ref 0.50–1.35)
GFR calc Af Amer: >90 mL/min (ref >90)
GFR calc non Af Amer: 83 mL/min — ABNORMAL LOW (ref >90)
GFR calc non Af Amer: >90 mL/min (ref >90)
GFR calc non Af Amer: >90 mL/min (ref >90)
Glucose, Bld: 158 mg/dL — ABNORMAL HIGH (ref 70–99)
Glucose, Bld: 222 mg/dL — ABNORMAL HIGH (ref 70–99)
Glucose, Bld: 231 mg/dL — ABNORMAL HIGH (ref 70–99)
Glucose, Bld: 84 mg/dL (ref 70–99)
Potassium: 4.6 mEq/L (ref 3.5–5.1)
Sodium: 138 mEq/L (ref 135–145)

## 2012-10-02 LAB — GLUCOSE, CAPILLARY
Glucose-Capillary: 131 mg/dL — ABNORMAL HIGH (ref 70–99)
Glucose-Capillary: 132 mg/dL — ABNORMAL HIGH (ref 70–99)
Glucose-Capillary: 160 mg/dL — ABNORMAL HIGH (ref 70–99)
Glucose-Capillary: 221 mg/dL — ABNORMAL HIGH (ref 70–99)
Glucose-Capillary: 202 mg/dL — ABNORMAL HIGH (ref 70–99)
Glucose-Capillary: 215 mg/dL — ABNORMAL HIGH (ref 70–99)
Glucose-Capillary: 195 mg/dL — ABNORMAL HIGH (ref 70–99)
Glucose-Capillary: 110 mg/dL — ABNORMAL HIGH (ref 70–99)
Glucose-Capillary: 142 mg/dL — ABNORMAL HIGH (ref 70–99)
Glucose-Capillary: 147 mg/dL — ABNORMAL HIGH (ref 70–99)
Glucose-Capillary: 87 mg/dL (ref 70–99)
Glucose-Capillary: 177 mg/dL — ABNORMAL HIGH (ref 70–99)

## 2012-10-02 LAB — COMPREHENSIVE METABOLIC PANEL
Albumin: 3.2 g/dL — ABNORMAL LOW (ref 3.5–5.2)
BUN: 18 mg/dL (ref 6–23)
Calcium: 8.7 mg/dL (ref 8.4–10.5)
GFR calc Af Amer: 88 mL/min — ABNORMAL LOW (ref >90)
Glucose, Bld: 154 mg/dL — ABNORMAL HIGH (ref 70–99)
Potassium: 3.7 mEq/L (ref 3.5–5.1)
Total Protein: 6.1 g/dL (ref 6.0–8.3)

## 2012-10-02 NOTE — Plan of Care (Signed)
Problem: Food- and Nutrition-Related Knowledge Deficit (NB-1.1) Goal: Nutrition education Formal process to instruct or train a patient/client in a skill or to impart knowledge to help patients/clients voluntarily manage or modify food choices and eating behavior to maintain or improve health.  Outcome: Completed/Met Date Met:  10/02/12 Patient with DM for 15 years. He reports that he has talked to an RD in the past regarding diet. We reviewed basic carb counting, including foods with carbs, label reading, portion size, and meal planning. Educational handout provided.

## 2012-10-02 NOTE — Progress Notes (Addendum)
[facility] PROGRESS NOTE  [name] [name] ZOX:[id number] DOB: 1956-11-24 DOA: 10/01/2012 PCP: Connie Fenton, MD  Brief narrative: 56 year old male with past medical history of diabetes, depression and GERD admitted for DKA. Patient is still requiring insulin drip due to increased anion gap.  Assessment and Plan:   Principal Problem:  *DKA, type 2  Secondary to non compliance Continue insulin drip and DKA protocol Keep NPO while on insulin drip  Active Problems:  DM (diabetes mellitus), type 2, uncontrolled with complications  Appreciate diabetic coordinator input - will follow up on recomendations Nutrition education Leukocytosis  The first value on admission showed normal WBC count and then the second value elevated at 22.5; this was not repeated but perhaps is lab error as today's' WBC count around 11  Patient did not have a fever and no obvious source of infection Anxiety and Depression  Continue Klonopin, Wellbutrin and Seroquel per home regimen GERD (gastroesophageal reflux disease)  We have started protonix 40 mg IV Hyperkalemia  Resolved Peripheral neuropathy  Continue Lyrica  Code Status: Full  Family Communication: Family not at bedside Disposition Plan: home when stable  Manson Passey, MD  [facility]  Pager [phone]   If 7PM-7AM, please contact night-coverage [email or web address] Password [email or web address] 10/02/2012, 10:42 AM   LOS: 1 day   HPI/Subjective: No acute events overnight. Patient says he feels little better today.  Objective: Filed Vitals:   10/02/12 0000 10/02/12 0200 10/02/12 0400 10/02/12 0800  BP: 110/56 107/65 104/58 100/58  Pulse: 84  81 94  Temp: 98 F (36.7 C)  97.7 F (36.5 C) 98.3 F (36.8 C)  TempSrc: Oral  Oral Oral  Resp: 14  11 12   Height:      Weight:   89.1 kg (196 lb 6.9 oz)   SpO2: 99%  98% 99%    Intake/Output Summary (Last 24 hours) at 10/02/12 1042 Last data filed at 10/02/12 0900  Gross per 24 hour  Intake 1801.93 ml  Output    1900 ml  Net -98.07 ml    Exam:   General:  Pt is alert, follows commands appropriately, not in acute distress  Cardiovascular: Regular rate and rhythm, S1/S2, no murmurs, no rubs, no gallops  Respiratory: Clear to auscultation bilaterally, no wheezing, no crackles, no rhonchi  Abdomen: Soft, non tender, non distended, bowel sounds present, no guarding  Extremities: No edema, pulses DP and PT palpable bilaterally  Neuro: Grossly nonfocal  Data Reviewed: Basic Metabolic Panel:  Lab 10/02/12 8295 10/02/12 0402 10/02/12 0018 10/01/12 1955 10/01/12 1623  NA 138 137 138 138 139  K 4.6 3.7 4.5 4.4 4.4  CL 109 110 108 110 112  CO2 12* 16* 14* 13* 11*  GLUCOSE 231* 154* 158* 110* 160*  BUN 18 18 19 20 20   CREATININE 1.00 1.08 1.16 1.19 1.23  CALCIUM 8.8 8.7 8.9 8.9 9.0   Liver Function Tests:  Lab 10/02/12 0402  AST 9  ALT 6  ALKPHOS 92  BILITOT 0.4  PROT 6.1  ALBUMIN 3.2*   CBC:  Lab 10/02/12 0402 10/01/12 0959 10/01/12 0320  WBC 11.4* 22.5* 9.3  HGB 14.0 15.4 16.3  HCT 39.5 44.4 45.6  MCV 83.5 86.2 84.0  PLT 244 300 290   CBG:  Lab 10/02/12 0852 10/02/12 0750 10/02/12 0642 10/02/12 0634 10/02/12 0531  GLUCAP 202* 180* 131* 129* 139*    Recent Results (from the past 240 hour(s))  MRSA PCR SCREENING     Status: Normal   Collection  Time   10/01/12  8:54 AM      Component Value Range Status Comment   MRSA by PCR NEGATIVE  NEGATIVE Final      Studies: Dg Chest Port 1 View 10/01/2012  * IMPRESSION: No active disease.   Original Report Authenticated By: Myles Rosenthal, M.D.     Scheduled Meds:  . aspirin EC  81 mg Oral Daily  . buPROPion  150 mg Oral Daily  . pantoprazole  40 mg Intravenous Q24H  . pregabalin  150 mg Oral BID  . QUEtiapine  50 mg Oral QHS

## 2012-10-03 LAB — BASIC METABOLIC PANEL
BUN: 12 mg/dL (ref 6–23)
BUN: 11 mg/dL (ref 6–23)
BUN: 11 mg/dL (ref 6–23)
CO2: 18 mEq/L — ABNORMAL LOW (ref 19–32)
Calcium: 8.3 mg/dL — ABNORMAL LOW (ref 8.4–10.5)
Calcium: 8.3 mg/dL — ABNORMAL LOW (ref 8.4–10.5)
Calcium: 8.4 mg/dL (ref 8.4–10.5)
Calcium: 8.4 mg/dL (ref 8.4–10.5)
Creatinine, Ser: 0.79 mg/dL (ref 0.50–1.35)
Creatinine, Ser: 0.84 mg/dL (ref 0.50–1.35)
Creatinine, Ser: 0.92 mg/dL (ref 0.50–1.35)
GFR calc Af Amer: >90 mL/min (ref >90)
GFR calc Af Amer: >90 mL/min (ref >90)
GFR calc Af Amer: >90 mL/min (ref >90)
GFR calc Af Amer: >90 mL/min (ref >90)
GFR calc Af Amer: >90 mL/min (ref >90)
GFR calc non Af Amer: >90 mL/min (ref >90)
GFR calc non Af Amer: >90 mL/min (ref >90)
GFR calc non Af Amer: >90 mL/min (ref >90)
GFR calc non Af Amer: >90 mL/min (ref >90)
GFR calc non Af Amer: >90 mL/min (ref >90)
Glucose, Bld: 127 mg/dL — ABNORMAL HIGH (ref 70–99)
Glucose, Bld: 337 mg/dL — ABNORMAL HIGH (ref 70–99)
Potassium: 3.3 mEq/L — ABNORMAL LOW (ref 3.5–5.1)
Potassium: 3.5 mEq/L (ref 3.5–5.1)
Sodium: 134 mEq/L — ABNORMAL LOW (ref 135–145)
Sodium: 137 mEq/L (ref 135–145)
Sodium: 133 mEq/L — ABNORMAL LOW (ref 135–145)

## 2012-10-03 LAB — GLUCOSE, CAPILLARY
Glucose-Capillary: 242 mg/dL — ABNORMAL HIGH (ref 70–99)
Glucose-Capillary: 143 mg/dL — ABNORMAL HIGH (ref 70–99)
Glucose-Capillary: 113 mg/dL — ABNORMAL HIGH (ref 70–99)
Glucose-Capillary: 148 mg/dL — ABNORMAL HIGH (ref 70–99)
Glucose-Capillary: 138 mg/dL — ABNORMAL HIGH (ref 70–99)
Glucose-Capillary: 158 mg/dL — ABNORMAL HIGH (ref 70–99)
Glucose-Capillary: 183 mg/dL — ABNORMAL HIGH (ref 70–99)
Glucose-Capillary: 333 mg/dL — ABNORMAL HIGH (ref 70–99)
Glucose-Capillary: 289 mg/dL — ABNORMAL HIGH (ref 70–99)

## 2012-10-03 MED ORDER — INFLUENZA VIRUS VACC SPLIT PF IM SUSP
0.5000 mL | INTRAMUSCULAR | Status: DC
  Filled 2012-10-03: qty 0.5

## 2012-10-03 MED ORDER — PNEUMOCOCCAL VAC POLYVALENT 25 MCG/0.5ML IJ INJ
0.5000 mL | INJECTION | INTRAMUSCULAR | Status: DC
  Filled 2012-10-03: qty 0.5

## 2012-10-03 MED ORDER — INSULIN GLARGINE 100 UNIT/ML ~~LOC~~ SOLN
10.0000 [IU] | Freq: Once | SUBCUTANEOUS | Status: AC
  Administered 2012-10-03: 10 [IU] via SUBCUTANEOUS

## 2012-10-03 MED ORDER — INSULIN ASPART 100 UNIT/ML ~~LOC~~ SOLN
0.0000 [IU] | Freq: Three times a day (TID) | SUBCUTANEOUS | Status: DC
  Administered 2012-10-03: 15 [IU] via SUBCUTANEOUS
  Administered 2012-10-03: 4 [IU] via SUBCUTANEOUS

## 2012-10-03 MED ORDER — INSULIN GLARGINE 100 UNIT/ML ~~LOC~~ SOLN: 40.0000 [IU] | Freq: Two times a day (BID) | SUBCUTANEOUS | Status: DC

## 2012-10-03 MED ORDER — INSULIN ASPART 100 UNIT/ML ~~LOC~~ SOLN
4.0000 [IU] | Freq: Three times a day (TID) | SUBCUTANEOUS | Status: DC
  Administered 2012-10-04: 4 [IU] via SUBCUTANEOUS

## 2012-10-03 MED ORDER — INSULIN GLARGINE 100 UNIT/ML ~~LOC~~ SOLN
20.0000 [IU] | Freq: Two times a day (BID) | SUBCUTANEOUS | Status: DC
  Administered 2012-10-03 – 2012-10-04 (×2): 20 [IU] via SUBCUTANEOUS

## 2012-10-03 MED ORDER — POTASSIUM CHLORIDE 10 MEQ/100ML IV SOLN
10.0000 meq | INTRAVENOUS | Status: AC
  Administered 2012-10-03 (×3): 10 meq via INTRAVENOUS
  Filled 2012-10-03: qty 200
  Filled 2012-10-03: qty 100

## 2012-10-03 MED ORDER — PANTOPRAZOLE SODIUM 40 MG PO TBEC
40.0000 mg | DELAYED_RELEASE_TABLET | Freq: Every day | ORAL | Status: DC
  Administered 2012-10-04: 40 mg via ORAL
  Filled 2012-10-03: qty 1

## 2012-10-03 MED ORDER — INSULIN ASPART 100 UNIT/ML ~~LOC~~ SOLN
5.0000 [IU] | Freq: Three times a day (TID) | SUBCUTANEOUS | Status: DC
  Administered 2012-10-03: 5 [IU] via SUBCUTANEOUS

## 2012-10-03 MED ORDER — INSULIN ASPART 100 UNIT/ML ~~LOC~~ SOLN
0.0000 [IU] | Freq: Every day | SUBCUTANEOUS | Status: DC
  Administered 2012-10-03: 3 [IU] via SUBCUTANEOUS

## 2012-10-03 NOTE — Progress Notes (Signed)
Report given to [employer] on [address]

## 2012-10-03 NOTE — Progress Notes (Signed)
[facility] PROGRESS NOTE  [name] [name] ZOX:[id number] DOB: Sep 30, 1956 DOA: 10/01/2012 PCP: Exander Fenton, MD  Brief narrative: 56 year old male with past medical history of diabetes, depression and GERD admitted for DKA. Patient required insulin drip for 2 days. Patient feels better today. We will discontinue insulin drip and transitioned to subcutaneous insulin. Habits patient can be discharge in next 24 hours.  Assessment and Plan:   Principal Problem:  *DKA, type 2  Secondary to non compliance  We can discontinue insulin drip Lantus 10 units subcutaneous given 30 minutes prior to insulin drip discontinuation Diet advanced to carb modified I have placed the order for insulin sliding-scale and have restarted patient's home insulin regimen Appreciated diabetic coordinator input  Active Problems:  DM (diabetes mellitus), type 2, uncontrolled with complications  Appreciate diabetic coordinator input - will follow up on recomendations  Nutrition consulted Leukocytosis  The first value on admission showed normal WBC count and then the second value elevated at 22.5 which might have been a lab error Patient remains afebrile and there is no elevated white blood cell count and no obvious source of infection for which reason we did not start antibiotics Anxiety and Depression  Continue Klonopin, Wellbutrin and Seroquel per home regimen GERD (gastroesophageal reflux disease)  Continue protonix 40 mg IV Hyperkalemia  Resolved Peripheral neuropathy  Continue Lyrica  Code Status: Full  Family Communication: Family not at bedside  Disposition Plan: home when stable   Manson Passey, MD  [facility]  Pager [phone]   If 7PM-7AM, please contact night-coverage [email or web address] Password [email or web address] 10/03/2012, 2:37 PM   LOS: 2 days   HPI/Subjective: Patient feels better today.  Objective: Filed Vitals:   10/03/12 0510 10/03/12 0800 10/03/12 1100 10/03/12 1424  BP: 107/67 113/71 116/69  106/74  Pulse: 84 87 82 98  Temp:  97.2 F (36.2 C)  98.7 F (37.1 C)  TempSrc:  Oral  Oral  Resp: 13 18 12 16   Height:    5\' 10"  (1.778 m)  Weight:      SpO2: 98% 98% 98% 98%    Intake/Output Summary (Last 24 hours) at 10/03/12 1437 Last data filed at 10/03/12 1300  Gross per 24 hour  Intake 2515.16 ml  Output    700 ml  Net 1815.16 ml    Exam:   General:  Pt is alert, follows commands appropriately, not in acute distress  Cardiovascular: Regular rate and rhythm, S1/S2, no murmurs, no rubs, no gallops  Respiratory: Clear to auscultation bilaterally, no wheezing, no crackles, no rhonchi  Abdomen: Soft, non tender, non distended, bowel sounds present, no guarding  Extremities: No edema, pulses DP and PT palpable bilaterally  Neuro: Grossly nonfocal  Data Reviewed: Basic Metabolic Panel:  Lab 10/03/12 8295 10/03/12 0735 10/03/12 0341 10/03/12 0003 10/02/12 1959  NA 137 134* 134* 133* 133*  K 3.7 3.3* 3.3* 3.3* 3.8  CL 107 106 105 105 104  CO2 18* 18* 18* 17* 15*  GLUCOSE 185* 176* 127* 166* 196*  BUN 11 11 12 14 15   CREATININE 0.79 0.84 0.90 0.92 0.95  CALCIUM 8.3* 8.3* 8.4 8.4 8.3*   Liver Function Tests:  Lab 10/02/12 0402  AST 9  ALT 6  ALKPHOS 92  BILITOT 0.4  PROT 6.1  ALBUMIN 3.2*   CBC:  Lab 10/02/12 0402 10/01/12 0959 10/01/12 0320  WBC 11.4* 22.5* 9.3  HGB 14.0 15.4 16.3  HCT 39.5 44.4 45.6  MCV 83.5 86.2 84.0  PLT 244 300 290  CBG:  Lab 10/03/12 1139 10/03/12 1005 10/03/12 0902 10/03/12 0804 10/03/12 0701  GLUCAP 183* 158* 138* 147* 188*    MRSA PCR SCREENING     Status: Normal   Collection Time   10/01/12  8:54 AM      Component Value Range Status Comment   MRSA by PCR NEGATIVE  NEGATIVE Final      Scheduled Meds:  . aspirin EC  81 mg Oral Daily  . buPROPion  150 mg Oral Daily  . insulin aspart  0-20 Units Subcutaneous TID WC  . insulin aspart  0-5 Units Subcutaneous QHS  . insulin aspart  5 Units Subcutaneous TID AC  .  insulin glargine  40 Units Subcutaneous BID  . pantoprazole  40 mg Oral Daily  . pregabalin  150 mg Oral BID  . QUEtiapine  50 mg Oral QHS

## 2012-10-03 NOTE — Progress Notes (Signed)
This patient is receiving IV Protonix. Based on criteria approved by the Pharmacy and Therapeutics Committee, this medication is being converted to the equivalent oral dose form. These criteria include: . The patient is eating (either orally or per tube) and/or has been taking other orally administered medications for at least 24 hours. . This patient has no evidence of active gastrointestinal bleeding or impaired GI absorption (gastrectomy, short bowel, patient on TNA or NPO).  If you have questions about this conversion, please contact the pharmacy department. Thank you.  Clance Boll, PharmD, BCPS Pager: [phone] 10/03/2012 2:06 PM

## 2012-10-03 NOTE — Progress Notes (Signed)
Inpatient Diabetes Program Recommendations  AACE/ADA: New Consensus Statement on Inpatient Glycemic Control (2013)  Target Ranges:  Prepandial:   less than 140 mg/dL      Peak postprandial:   less than 180 mg/dL (1-2 hours)      Critically ill patients:  140 - 180 mg/dL   Reason for Visit: Diabetes Consult - DKA  56 year old male with past medical history of diabetes, depression and GERD who presented to ED with complaints of feeling weak for past few days.  Admitted to ICU in DKA.  Pt on GlucoStabilizer x 2 days and transitioned to SQ insulin.  Pt reports he ran out of Lantus a few days ago, and then began to feel sick.  States he continued to take Novolog 5 units tid at home. Reports glucose monitoring bid at home. Sees MD at [facility] since closing of [facility].  In process of applying for medication assistance from [employer] for Lantus insulin.  Has been approved for assistance for Novolog.   Results for [name] [name], [name] [name] (MRN [id number]) as of 10/03/2012 16:28  Ref. Range 10/03/2012 07:01 10/03/2012 08:04 10/03/2012 09:02 10/03/2012 10:05 10/03/2012 11:39  Glucose-Capillary Latest Range: 70-99 mg/dL 409 (H) 811 (H) 914 (H) 158 (H) 183 (H)  Results for [name] [name], [name] [name] (MRN [id number]) as of 10/03/2012 16:28  Ref. Range 10/03/2012 11:44  Sodium Latest Range: 135-145 mEq/L 137  Potassium Latest Range: 3.5-5.1 mEq/L 3.7  Chloride Latest Range: 96-112 mEq/L 107  CO2 Latest Range: 19-32 mEq/L 18 (L)  BUN Latest Range: 6-23 mg/dL 11  Creatinine Latest Range: 0.50-1.35 mg/dL 0.86  Calcium Latest Range: 8.4-10.5 mg/dL 8.3 (L)  GFR calc non Af Amer Latest Range: >90 mL/min >90  GFR calc Af Amer Latest Range: >90 mL/min >90  Glucose Latest Range: 70-99 mg/dL 578 (H)   Results for [name] [name], [name] [name] (MRN [id number]) as of 10/03/2012 16:28  Ref. Range 10/01/2012 09:59  Hemoglobin A1C Latest Range: <5.7 % 11.1 (H)   DKA resolving.  Will need increase in basal insulin and addition of meal coverage  insulin.  Recommendations:  Increase Lantus to 20 units bid (Last home dose was Lantus 30 units bid) Add Novolog 4 units tidwc for meal coverage insulin. Continue with Novolog resistant tidwc and hs. Social work consult for medication assistance - ? Medicaid potential?  Will continue to follow.

## 2012-10-04 LAB — CBC
HCT: 36.3 % — ABNORMAL LOW (ref 39.0–52.0)
Hemoglobin: 12.9 g/dL — ABNORMAL LOW (ref 13.0–17.0)
MCH: 29.3 pg (ref 26.0–34.0)
MCV: 82.5 fL (ref 78.0–100.0)
RBC: 4.4 MIL/uL (ref 4.22–5.81)
WBC: 4.4 10*3/uL (ref 4.0–10.5)

## 2012-10-04 LAB — GLUCOSE, CAPILLARY

## 2012-10-04 LAB — BASIC METABOLIC PANEL
BUN: 12 mg/dL (ref 6–23)
BUN: 11 mg/dL (ref 6–23)
CO2: 20 mEq/L (ref 19–32)
CO2: 21 mEq/L (ref 19–32)
Calcium: 8.2 mg/dL — ABNORMAL LOW (ref 8.4–10.5)
Calcium: 8.4 mg/dL (ref 8.4–10.5)
Calcium: 8.6 mg/dL (ref 8.4–10.5)
Chloride: 104 mEq/L (ref 96–112)
Chloride: 105 mEq/L (ref 96–112)
Creatinine, Ser: 0.8 mg/dL (ref 0.50–1.35)
Creatinine, Ser: 0.89 mg/dL (ref 0.50–1.35)
GFR calc Af Amer: >90 mL/min (ref >90)
GFR calc non Af Amer: >90 mL/min (ref >90)
Glucose, Bld: 209 mg/dL — ABNORMAL HIGH (ref 70–99)
Glucose, Bld: 246 mg/dL — ABNORMAL HIGH (ref 70–99)
Sodium: 135 mEq/L (ref 135–145)
Sodium: 135 mEq/L (ref 135–145)

## 2012-10-04 MED ORDER — CLONAZEPAM 2 MG PO TABS: 2.0000 mg | ORAL_TABLET | Freq: Two times a day (BID) | ORAL | Status: DC | PRN

## 2012-10-04 MED ORDER — ZOLPIDEM TARTRATE 10 MG PO TABS: 10.0000 mg | ORAL_TABLET | Freq: Every evening | ORAL | Status: DC | PRN

## 2012-10-04 MED ORDER — POTASSIUM CHLORIDE CRYS ER 20 MEQ PO TBCR
40.0000 meq | EXTENDED_RELEASE_TABLET | Freq: Once | ORAL | Status: DC
  Filled 2012-10-04: qty 2

## 2012-10-04 MED ORDER — PANTOPRAZOLE SODIUM 40 MG PO TBEC: 40.0000 mg | DELAYED_RELEASE_TABLET | Freq: Every day | ORAL | Status: DC

## 2012-10-04 MED ORDER — HYDROCODONE-ACETAMINOPHEN 5-325 MG PO TABS: 1.0000 | ORAL_TABLET | Freq: Four times a day (QID) | ORAL | Status: DC | PRN

## 2012-10-04 MED ORDER — INSULIN ASPART 100 UNIT/ML ~~LOC~~ SOLN: 4.0000 [IU] | Freq: Three times a day (TID) | SUBCUTANEOUS | Status: DC

## 2012-10-04 MED ORDER — PREGABALIN 150 MG PO CAPS: 150.0000 mg | ORAL_CAPSULE | Freq: Two times a day (BID) | ORAL | Status: DC

## 2012-10-04 MED ORDER — INSULIN GLARGINE 100 UNIT/ML ~~LOC~~ SOLN: 20.0000 [IU] | Freq: Two times a day (BID) | SUBCUTANEOUS | Status: DC

## 2012-10-04 MED ORDER — ASPIRIN EC 81 MG PO TBEC: 81.0000 mg | DELAYED_RELEASE_TABLET | Freq: Every day | ORAL | Status: DC

## 2012-10-04 MED ORDER — BUPROPION HCL ER (SR) 150 MG PO TB12: 150.0000 mg | ORAL_TABLET | Freq: Every day | ORAL | Status: DC

## 2012-10-04 NOTE — Discharge Summary (Signed)
Physician Discharge Summary  [name] [name] ZOX:[id number] DOB: Jul 30, 1956 DOA: 10/01/2012  PCP: Lott Fenton, MD  Admit date: 10/01/2012 Discharge date: 10/04/2012  Recommendations for Outpatient Follow-up:  1. Please followup with primary care provider in one to 2 weeks after discharge or sooner if symptoms worsen  Discharge Diagnoses:  Principal Problem:  *DKA, type 2 Active Problems:  DM (diabetes mellitus), type 2, uncontrolled with complications  Depression  GERD (gastroesophageal reflux disease)  Hyperkalemia  Peripheral neuropathy  Discharge Condition: Medically stable and clinically appears well for discharge home today; patient agrees with the discharge plan  Diet recommendation: Diabetic diet  History of present illness:  56 year old male with past medical history of diabetes, depression and GERD admitted for DKA. Patient required insulin drip for 2 days. Patient feels better today. Insulin drip has been discontinued and diet advanced to carb modified.  Assessment and Plan:   Principal Problem:  *DKA, type 2  Secondary to non compliance likely due to financial issues. I asked the patient if he needs financial assistance with medications and he reported that he has money to refill medications but if we could help somehow he would appreciated it. I have engaged care management for assistance and we will provide diabetic medications through indigent funds. Insulin drip discontinued We will prescribe the following regimen for home: Lantus 20 units subcutaneously twice a day and insulin NovoLog 4 units 3 times daily with meals Patient may continue oral diabetic medications which he was taking at home Diet advanced to carb modified  Appreciated diabetic coordinator recommendations  Active Problems:  DM (diabetes mellitus), type 2, uncontrolled with complications  Noncompliance secondary to financial issues. Please note that we have provided some assistance through indigent  funds Hemoglobin A1c on this admission is 11.1 indicating poor glucose control for which reason patient has to be on oral diabetic medication as well as insulin Nutrition consulted Leukocytosis  The first value on admission showed normal WBC count and then the second value elevated at 22.5 which might have been a lab error  Patient remains afebrile and there is no elevated white blood cell count and no obvious source of infection for which reason we did not start antibiotics Anxiety and Depression  Continue Klonopin, Wellbutrin Seroquel has been discontinued and patient does not need to continue that on discharge GERD (gastroesophageal reflux disease)  Continue protonix 40 mg daily Hypokalemia Likely secondary to insulin Please note that potassium has been repleted prior to discharge Peripheral neuropathy  Continue Lyrica  Code Status: Full  Family Communication: Family not at bedside  Disposition Plan: home today  Manson Passey, MD  [facility]  Pager [phone]   Discharge Exam: Filed Vitals:   10/04/12 0512  BP: 115/69  Pulse: 85  Temp: 97.9 F (36.6 C)  Resp: 16   Filed Vitals:   10/03/12 2153 10/04/12 0144 10/04/12 0500 10/04/12 0512  BP: 123/62 114/69  115/69  Pulse: 96 88  85  Temp: 98.2 F (36.8 C) 97.3 F (36.3 C)  97.9 F (36.6 C)  TempSrc: Oral Oral  Oral  Resp: 16 14  16   Height:      Weight:   93 kg (205 lb 0.4 oz)   SpO2: 98% 99%  98%    General: Pt is alert, follows commands appropriately, not in acute distress Cardiovascular: Regular rate and rhythm, S1/S2 +, no murmurs, no rubs, no gallops Respiratory: Clear to auscultation bilaterally, no wheezing, no crackles, no rhonchi Abdominal: Soft, non tender, non distended,  bowel sounds +, no guarding Extremities: no edema, no cyanosis, pulses palpable bilaterally DP and PT Neuro: Grossly nonfocal  Discharge Instructions  Discharge Orders    Future Orders Please Complete By Expires   Diet - low sodium heart  healthy      Increase activity slowly      Call MD for:  persistant nausea and vomiting      Call MD for:  severe uncontrolled pain      Call MD for:  difficulty breathing, headache or visual disturbances      Call MD for:  persistant dizziness or light-headedness          Medication List     As of 10/04/2012  9:51 AM    TAKE these medications         aspirin EC 81 MG tablet   Take 1 tablet (81 mg total) by mouth daily.      buPROPion 150 MG 12 hr tablet   Commonly known as: WELLBUTRIN SR   Take 1 tablet (150 mg total) by mouth daily.      clonazePAM 2 MG tablet   Commonly known as: KLONOPIN   Take 1 tablet (2 mg total) by mouth 2 (two) times daily as needed for anxiety. For anxiety      HYDROcodone-acetaminophen 5-325 MG per tablet   Commonly known as: NORCO/VICODIN   Take 1 tablet by mouth every 6 (six) hours as needed. pain      insulin aspart 100 UNIT/ML injection   Commonly known as: novoLOG   Inject 4 Units into the skin 3 (three) times daily before meals.      insulin glargine 100 UNIT/ML injection   Commonly known as: LANTUS   Inject 20 Units into the skin 2 (two) times daily.      metFORMIN 1000 MG tablet   Commonly known as: GLUCOPHAGE   Take 1,000 mg by mouth 2 (two) times daily with a meal.      pantoprazole 40 MG tablet   Commonly known as: PROTONIX   Take 1 tablet (40 mg total) by mouth daily.      pregabalin 150 MG capsule   Commonly known as: LYRICA   Take 1 capsule (150 mg total) by mouth 2 (two) times daily.      QUEtiapine 50 MG tablet   Commonly known as: SEROQUEL   Take 1 tablet (50 mg total) by mouth at bedtime.      zolpidem 10 MG tablet   Commonly known as: AMBIEN   Take 1 tablet (10 mg total) by mouth at bedtime as needed for sleep. Sleep.           Follow-up Information    Follow up with [name] Fenton, MD. In 2 weeks.   Contact information:   [address] [place] [address] [phone]           The  results of significant diagnostics from this hospitalization (including imaging, microbiology, ancillary and laboratory) are listed below for reference.    Significant Diagnostic Studies: Dg Chest Port 1 View 10/01/2012    IMPRESSION: No active disease.   Original Report Authenticated By: Myles Rosenthal, M.D.     Microbiology: Recent Results (from the past 240 hour(s))  MRSA PCR SCREENING     Status: Normal   Collection Time   10/01/12  8:54 AM      Component Value Range Status Comment   MRSA by PCR NEGATIVE  NEGATIVE Final  Labs: Basic Metabolic Panel:  Lab 10/04/12 1610 10/04/12 0405 10/04/12 0025 10/03/12 1956 10/03/12 1607  NA 135 133* 135 134* 133*  K 3.2* 3.2* 3.4* 3.5 3.9  CL 106 104 105 104 105  CO2 21 20 21  17* 18*  GLUCOSE 246* 209* 218* 259* 337*  BUN 11 12 13 11 10   CREATININE 0.75 0.80 0.89 0.82 0.77  CALCIUM 8.2* 8.4 8.6 8.6 8.4  MG -- -- -- -- --  PHOS -- -- -- -- --   Liver Function Tests:  Lab 10/02/12 0402  AST 9  ALT 6  ALKPHOS 92  BILITOT 0.4  PROT 6.1  ALBUMIN 3.2*   No results found for this basename: LIPASE:5,AMYLASE:5 in the last 168 hours No results found for this basename: AMMONIA:5 in the last 168 hours CBC:  Lab 10/04/12 0405 10/02/12 0402 10/01/12 0959 10/01/12 0320  WBC 4.4 11.4* 22.5* 9.3  HGB 12.9* 14.0 15.4 16.3  HCT 36.3* 39.5 44.4 45.6  MCV 82.5 83.5 86.2 84.0  PLT 217 244 300 290   Cardiac Enzymes: No results found for this basename: CKTOTAL:5,CKMB:5,CKMBINDEX:5,TROPONINI:5 in the last 168 hours BNP: BNP (last 3 results) No results found for this basename: PROBNP:3 in the last 8760 hours CBG:  Lab 10/04/12 0746 10/03/12 2148 10/03/12 1628 10/03/12 1139 10/03/12 1005  GLUCAP 221* 289* 333* 183* 158*    Time coordinating discharge: Over 30 minutes  Signed:  Manson Passey, MD  [facility] 10/04/2012, 9:51 AM  Pager #: [phone]

## 2012-10-04 NOTE — Progress Notes (Signed)
Talked to patient about follow up medical care, PCP is Dr Ermalinda Memos, last apt was Sept 25, 2013 and the pt plans to call the PCP at discharge to schedule a hospital follow up apt. Meds are filled at [facility]. Patient qualifies for the indigent funds- script for lantus sent to pharmacy; patient stated that he had a lot of the meds that were ordered at home and could afford the rest of his medication. He also stated that he had his Novolin pens at home with insulin; He has applied for the medication assistance program and awaiting for their approval.  Patient is independent of all ADL's and is going to a community college, lives with spouse which is disable to work.

## 2012-10-06 ENCOUNTER — Telehealth: Payer: Self-pay | Admitting: Family Medicine

## 2012-10-06 NOTE — Telephone Encounter (Signed)
Paperwork for prescriptions to be signed by Ermalinda Memos.

## 2012-10-10 ENCOUNTER — Encounter: Payer: Self-pay | Admitting: Home Health Services

## 2012-10-11 ENCOUNTER — Encounter: Payer: Self-pay | Admitting: Home Health Services

## 2012-10-11 NOTE — Telephone Encounter (Signed)
Paperwork is in Dr. Felipa Emory box.  Will discuss with him that we typically do not offer this service.

## 2012-10-11 NOTE — Telephone Encounter (Signed)
Informed patient that we do not normally fill out this paperwork. Also informed him that is is signed and awaiting the rest of his financial documentation up front. Of note, he was recently hospitalized at [facility] long ED with DKA and I advised him to schedule a hospital follow up.

## 2013-01-18 ENCOUNTER — Telehealth: Payer: Self-pay | Admitting: *Deleted

## 2013-01-18 NOTE — Telephone Encounter (Signed)
Call made to schedule DM FU. Pt  Last seen 08/24/12. appt scheduled at [facility] 3/7 at 2:30 pm.

## 2013-02-03 ENCOUNTER — Ambulatory Visit: Payer: Self-pay | Admitting: Family Medicine

## 2013-02-14 ENCOUNTER — Encounter: Payer: Self-pay | Admitting: Home Health Services

## 2013-02-24 ENCOUNTER — Ambulatory Visit: Payer: Self-pay | Admitting: Family Medicine

## 2013-04-06 ENCOUNTER — Ambulatory Visit (INDEPENDENT_AMBULATORY_CARE_PROVIDER_SITE_OTHER): Payer: No Typology Code available for payment source | Admitting: Family Medicine

## 2013-04-06 ENCOUNTER — Encounter: Payer: Self-pay | Admitting: Family Medicine

## 2013-04-06 VITALS — BP 119/80 | HR 78 | Ht 70.0 in | Wt 201.7 lb

## 2013-04-06 DIAGNOSIS — IMO0001 Reserved for inherently not codable concepts without codable children: Secondary | ICD-10-CM

## 2013-04-06 DIAGNOSIS — G8929 Other chronic pain: Secondary | ICD-10-CM | POA: Insufficient documentation

## 2013-04-06 DIAGNOSIS — E1165 Type 2 diabetes mellitus with hyperglycemia: Secondary | ICD-10-CM

## 2013-04-06 DIAGNOSIS — IMO0002 Reserved for concepts with insufficient information to code with codable children: Secondary | ICD-10-CM

## 2013-04-06 DIAGNOSIS — M25519 Pain in unspecified shoulder: Secondary | ICD-10-CM

## 2013-04-06 LAB — CBC WITH DIFFERENTIAL/PLATELET
Hemoglobin: 14.9 g/dL (ref 13.0–17.0)
Lymphocytes Relative: 46 % (ref 12–46)
Lymphs Abs: 2 10*3/uL (ref 0.7–4.0)
MCH: 29.4 pg (ref 26.0–34.0)
MCV: 84.2 fL (ref 78.0–100.0)
Monocytes Relative: 7 % (ref 3–12)
Neutrophils Relative %: 43 % (ref 43–77)
Platelets: 253 10*3/uL (ref 150–400)
RBC: 5.06 MIL/uL (ref 4.22–5.81)
WBC: 4.2 10*3/uL (ref 4.0–10.5)

## 2013-04-06 LAB — POCT GLYCOSYLATED HEMOGLOBIN (HGB A1C): Hemoglobin A1C: >14

## 2013-04-06 MED ORDER — INSULIN ASPART 100 UNIT/ML ~~LOC~~ SOLN: 4.0000 [IU] | Freq: Three times a day (TID) | SUBCUTANEOUS | Status: DC

## 2013-04-06 MED ORDER — INSULIN GLARGINE 100 UNIT/ML SOLOSTAR PEN: 20.0000 [IU] | PEN_INJECTOR | Freq: Two times a day (BID) | SUBCUTANEOUS | Status: DC

## 2013-04-06 NOTE — Assessment & Plan Note (Addendum)
INsulin dependent with worsening control because of access to meds,  A1C worsened to >14 from 11.1 CBGs Per Hx elevated to 200 fasting angd 190 post prandial  Hospitalized with DKA to in 11/13,  Taking 70/30 30 units BID now, wants to go back to old regimen It appears that access to meds has been the biggest problem,  He says that he now has the orange card which means that he should get his meds without problem through the MAP program I gave him new Rx for Lantus and novolog per his old regimen, 20 units BID lantus and 4 units TID AC novolog Hopegfully this will enhance his control Retinal scan in office today, will need Ophtho appt soon, refer next visit Consider appt with [name] to help [place] fine tune his regimen if his control doesn't improve really well.  CMP, CBC today, f/u 2 weeks when I have him back for shoulder injection Need to order Urine microalbumin next vist and consider adding ace for microalbuminuria if present

## 2013-04-06 NOTE — Patient Instructions (Addendum)
Thanks for coming in today!  Please see Arlys John, she is our health coach Please follow up with PT, now that you have the orange card this should not be a problem  Call me if you are unable to get the insulin from [facility], make sure they know you have an [facility] card.    Rotator Cuff Tear The rotator cuff is four tendons that assist in the motion of the shoulder. A rotator cuff tear is a tear in one of these four tendons. It is characterized by pain and weakness of the shoulder. The rotator cuff tendons surround the shoulder ball and socket joint (humeral head). The rotator cuff tendons attach to the shoulder blade (scapula) on one side and the upper arm bone (humerus) on the other side. The rotator cuff is essential for shoulder stability and shoulder motion. SYMPTOMS   Pain around the shoulder, often at the outer portion of the upper arm.  Pain that is worse with shoulder function, especially when reaching overhead or lifting.  Weakness of the shoulder muscles.  Aching when not using your arm; often, pain awakens you at night, especially when sleeping on the affected side.  Tenderness, swelling, warmth, or redness over the outer aspect of the shoulder.  Loss of strength.  Limited motion of the shoulder, especially reaching behind (reaching into one's back pocket) or across your body.  A crackling sound (crepitation) when moving the shoulder.  Biceps tendon pain (in the front of the shoulder) and inflammation, worse with bending the elbow or lifting. CAUSES   Strain from sudden increase in amount or intensity of activity.  Direct blow or injury to the shoulder.  Aging, wear from from normal use.  Roof of the shoulder (acromial) spur. RISK INCREASES WITH:   Contact sports (football, wrestling, or boxing).  Throwing or hitting sports (baseball, tennis, or volleyball).  Weightlifting and bodybuilding.  Heavy labor.  Previous injury to rotator cuff.  Failure to warm  up properly before activity.  Inadequate protective equipment.  Increasing age.  Spurring of the outer end of the scapula (acromion).  Cortisone injections.  Poor shoulder strength and flexibility. PREVENTION  Warm up and stretch properly before activity.  Allow time for rest and recovery between practices and competition.  Maintain physical fitness:  Cardiovascular fitness.  Shoulder flexibility.  Strength and endurance of the rotator cuff muscles and muscles of the shoulder blade.  Learn and use proper technique when throwing or hitting. PROGNOSIS Surgery is often needed. Although, symptoms may go away by themselves. RELATED COMPLICATIONS   Persistent pain that may progress to constant pain.  Shoulder stiffness, frozen shoulder syndrome, or loss of motion.  Recurrence of symptoms, especially if treated without surgery.  Inability to return to same level of sports, even with surgery.  Persistent weakness.  Risks of surgery, including infection, bleeding, injury to nerves, shoulder stiffness, weakness, re-tearing of the rotator cuff tendon.  Deltoid detachment, acromial fracture, and persistent pain. TREATMENT Treatment involves the use of ice and medicine to reduce pain and inflammation. Strengthening and stretching exercise are usually recommended. These exercises may be completed at home or with a therapist. You may also be instructed to modify offending activities. Corticosteroid injections may be given to reduce inflammation. Surgery is usually recommended for athletes. Surgery has the best chance for a full recovery. Surgery involves:  Removal of an inflamed bursa.  Removal of an acromial spur if present.  Suturing the torn tendon back together. Rotator cuff surgeries may be preformed  either arthroscopically or through an open incision. Recovery typically takes 6 to 12 months. MEDICATION  If pain medicine is necessary, then nonsteroidal anti-inflammatory  medicines, such as aspirin and ibuprofen, or other minor pain relievers, such as acetaminophen, are often recommended.  Do not take pain medicine for 7 days before surgery.  Prescription pain relievers are usually only prescribed after surgery. Use only as directed and only as much as you need.  Corticosteroid injections may be given to reduce inflammation. However, there is a limited number of times the joint may be injected with these medicines. HEAT AND COLD  Cold treatment (icing) relieves pain and reduces inflammation. Cold treatment should be applied for 10 to 15 minutes every 2 to 3 hours for inflammation and pain and immediately after any activity that aggravates your symptoms. Use ice packs or massage the area with a piece of ice (ice massage).  Heat treatment may be used prior to performing the stretching and strengthening activities prescribed by your caregiver, physical therapist, or athletic trainer. Use a heat pack or soak the injury in warm water. SEEK MEDICAL CARE IF:   Symptoms get worse or do not improve in 4 to 6 weeks despite treatment.  You experience pain, numbness, or coldness in the hand.  Blue, gray, or dark color appears in the fingernails.  New, unexplained symptoms develop (drugs used in treatment may produce side effects). Document Released: 11/16/2005 Document Revised: 02/08/2012 Document Reviewed: 02/28/2009 [facility] Patient Information 2013 [place], [place].

## 2013-04-06 NOTE — Progress Notes (Signed)
  Subjective:    Patient ID: [name] [name], male    DOB: [birth year], 57 y.o.   MRN: [id number]  HPI Pt here for f/u of DM2 and Chronic L shoulder pain  DM2- Cant afford lantus and novolog b/c of no current insurance- however he does currently have the orange card Using 70/30 30 units BID currently, reports intermittent CBG checks b/c he cant afford strips  Fasting CBG 200-220, evening 190's  + polyuria, + polydipsia  Denies hypoglycemia Is still taking metformin also.  Has seen lens crafter's in the last year, but no ophtho  L shoulder pain , chronic began 2 years ago after a dog walking accident  Saw sports med on [place] who reported small rotator cuff tear and ordered PT which he didn't get for insurance purposes Dr. [name] given Norco which helped without sedation, complains of sedation with advil and no real effect Heat helps, ice makes it worse Locking sensation inconsistently with reaching back, limited ROM, cant put on/take off shirts w/o pain 8/10 today Was getting shoulder injections, last one in July 2013 which helped well   Review of Systems G: no fevers, chills, sweats CV: no CP Resp: no dyspnea,  Endo: + polyuria, + polydipsia MSK: + shoulder pain, some L hand pain that recently resolved     Objective:   Physical Exam  Gen: NAD, alert, cooperative with exam, blunted affect CV: RRR, good S1/S2, no murmur Resp: CTABL, no wheezes, non-labored Abd: SNTND Ext: No edema Neuro: Alert and oriented, No gross deficits, normal gait    Assessment & Plan:

## 2013-04-06 NOTE — Assessment & Plan Note (Signed)
Likely old rotator cuff tear with severe limited ROm Avoid NSAIDs for now until I see teh toll hjis recent worsening DM2 has taken on his kidneys Schedule tylenol, continue heat, PT ( can get with orange card) F/u 2 weeks for steroid injection

## 2013-04-07 LAB — COMPREHENSIVE METABOLIC PANEL
ALT: <8 U/L (ref 0–53)
Albumin: 4 g/dL (ref 3.5–5.2)
CO2: 21 mEq/L (ref 19–32)
Calcium: 9.3 mg/dL (ref 8.4–10.5)
Chloride: 106 mEq/L (ref 96–112)
Glucose, Bld: 204 mg/dL — ABNORMAL HIGH (ref 70–99)
Potassium: 4.1 mEq/L (ref 3.5–5.3)
Sodium: 136 mEq/L (ref 135–145)
Total Protein: 6.4 g/dL (ref 6.0–8.3)

## 2013-04-20 ENCOUNTER — Ambulatory Visit: Payer: No Typology Code available for payment source | Admitting: Home Health Services

## 2013-04-21 ENCOUNTER — Ambulatory Visit: Payer: No Typology Code available for payment source | Attending: Family Medicine

## 2013-04-21 ENCOUNTER — Telehealth: Payer: Self-pay | Admitting: Family Medicine

## 2013-04-21 DIAGNOSIS — M2560 Stiffness of unspecified joint, not elsewhere classified: Secondary | ICD-10-CM | POA: Insufficient documentation

## 2013-04-21 DIAGNOSIS — M2550 Pain in unspecified joint: Secondary | ICD-10-CM | POA: Insufficient documentation

## 2013-04-21 DIAGNOSIS — G8929 Other chronic pain: Secondary | ICD-10-CM

## 2013-04-21 DIAGNOSIS — M6281 Muscle weakness (generalized): Secondary | ICD-10-CM | POA: Insufficient documentation

## 2013-04-21 DIAGNOSIS — IMO0001 Reserved for inherently not codable concepts without codable children: Secondary | ICD-10-CM | POA: Insufficient documentation

## 2013-04-21 MED ORDER — TRAMADOL HCL 50 MG PO TABS: 50.0000 mg | ORAL_TABLET | Freq: Four times a day (QID) | ORAL | Status: DC | PRN

## 2013-04-21 NOTE — Addendum Note (Signed)
Addended by: Elenora Gamma on: 04/21/2013 01:54 PM   Modules accepted: Orders

## 2013-04-21 NOTE — Telephone Encounter (Addendum)
Patient calling for increased shoulder pain after PT. I offered tramadol and recommended a trial of ice and heat if ice doesn't seem to help.  He is to see me in clinic on Thursday for a shoulder injection.  RX for tramadol sent  Gabreal Fenton, MD 04/21/2013, [phone]

## 2013-04-21 NOTE — Telephone Encounter (Signed)
Will route note to Dr. Ermalinda Memos for advice and call patient back.  Gaylene Brooks, RN

## 2013-04-21 NOTE — Telephone Encounter (Signed)
Went to PT today and his shoulder is giving him a lot of pain - ibuprofen is not working and wants to know if his doctor can call in something stronger until he can get in to see him next Thurs.  pls adivse

## 2013-04-27 ENCOUNTER — Ambulatory Visit (INDEPENDENT_AMBULATORY_CARE_PROVIDER_SITE_OTHER): Payer: No Typology Code available for payment source | Admitting: Family Medicine

## 2013-04-27 ENCOUNTER — Encounter: Payer: Self-pay | Admitting: Family Medicine

## 2013-04-27 VITALS — BP 108/63 | HR 90 | Ht 70.0 in | Wt 203.5 lb

## 2013-04-27 DIAGNOSIS — M25511 Pain in right shoulder: Secondary | ICD-10-CM

## 2013-04-27 DIAGNOSIS — E1165 Type 2 diabetes mellitus with hyperglycemia: Secondary | ICD-10-CM

## 2013-04-27 DIAGNOSIS — IMO0001 Reserved for inherently not codable concepts without codable children: Secondary | ICD-10-CM

## 2013-04-27 DIAGNOSIS — M25519 Pain in unspecified shoulder: Secondary | ICD-10-CM

## 2013-04-27 DIAGNOSIS — G8929 Other chronic pain: Secondary | ICD-10-CM

## 2013-04-27 DIAGNOSIS — E118 Type 2 diabetes mellitus with unspecified complications: Secondary | ICD-10-CM

## 2013-04-27 MED ORDER — GLUCOSE BLOOD VI STRP: ORAL_STRIP | Status: DC

## 2013-04-27 MED ORDER — METHYLPREDNISOLONE ACETATE 40 MG/ML IJ SUSP
40.0000 mg | Freq: Once | INTRAMUSCULAR | Status: AC
  Administered 2013-04-27: 40 mg via INTRA_ARTICULAR

## 2013-04-27 MED ORDER — "INSULIN SYRINGE 28G X 1/2"" 1 ML MISC": 1.0000 | Freq: Four times a day (QID) | Status: DC

## 2013-04-27 NOTE — Progress Notes (Addendum)
  Subjective:    Patient ID: [name] [name], male    DOB: Dec 01, 1955, 57 y.o.   MRN: [id number]  HPI Pt here for shoulder injection  L shoulder pain  It has continued since our visit 2 weeks ago, it worsened with PT but he is scheduled to go BID starting next week Tramadol not really helping, advil not helping either.  Previously saw sports med with good results from shoulder injections.  Continued locking sensation inconsistently with reaching back, limited ROM, cant put on/take off shirts w/o pain  Last shoulder injections in July 2013 which helped well  DM2 [facility] program has ordered his lantus pens, he is running low on lantus.  No strips or insulin syringes re-using those.  Health department has [facility] on hand Taking meds consistently   Review of Systems Per HPI    Objective:   Physical Exam  Gen: NAD, alert, cooperative with exam CV: RRR, good S1/S2, no murmur Resp: CTABL, no wheezes, non-labored MSK: L shoulder with pain of palpation of posterior joint line, Pain with infraspinatus and supraspinatus and empty can test Decreased ROM throughout : flexion, extension, abdution, and int/ext rotation compared to R.        Assessment & Plan:  L shoulder injection:  Informed consent obtained and placed in chart.  Time out performed.  Area cleaned  with iodine x 1 and wiped clear with alcohol swab.  Using 23 1 1/2 gauge needle 1 cc depo medrol (40mg /mL) and 3 cc's 2% xylocaine were injected in subacromial space via posterior approach.  Sterile bandage placed.  Patient tolerated procedure well.  No complications.

## 2013-04-27 NOTE — Patient Instructions (Signed)
Thanks for coming in today!  Lets follow up in 2 weeks for diabetes

## 2013-04-27 NOTE — Assessment & Plan Note (Signed)
Chronic L shoulder likely from rotator cuff pathology Injected with 40 mg depo-medrol and 3 ml 2% xylocaine as detailed in note PLan to continue PT, tramadol, ibuprofen F/u 2 weeks

## 2013-04-27 NOTE — Assessment & Plan Note (Addendum)
Very likely still out of control although he hasn't checked his CBG on 1 week due to no strips although compliance seem svery good.  Running low on lantus, [facility] program trying to get lantus pens.  Will switch to [facility] if needed on next visit as he says they prefer that,  Given another vial of Lantus sample today Sent Rx for strips and syringes, encouraged to log CBGs and will will adjust next visit.  Close f/u in 2 weeks.

## 2013-05-03 ENCOUNTER — Ambulatory Visit: Payer: No Typology Code available for payment source | Attending: Family Medicine

## 2013-05-03 DIAGNOSIS — IMO0001 Reserved for inherently not codable concepts without codable children: Secondary | ICD-10-CM | POA: Insufficient documentation

## 2013-05-03 DIAGNOSIS — M2550 Pain in unspecified joint: Secondary | ICD-10-CM | POA: Insufficient documentation

## 2013-05-03 DIAGNOSIS — M2560 Stiffness of unspecified joint, not elsewhere classified: Secondary | ICD-10-CM | POA: Insufficient documentation

## 2013-05-03 DIAGNOSIS — M6281 Muscle weakness (generalized): Secondary | ICD-10-CM | POA: Insufficient documentation

## 2013-05-05 ENCOUNTER — Ambulatory Visit: Payer: No Typology Code available for payment source | Admitting: Rehabilitation

## 2013-05-08 ENCOUNTER — Ambulatory Visit: Payer: No Typology Code available for payment source

## 2013-05-08 ENCOUNTER — Encounter: Payer: Self-pay | Admitting: Family Medicine

## 2013-05-08 ENCOUNTER — Ambulatory Visit (INDEPENDENT_AMBULATORY_CARE_PROVIDER_SITE_OTHER): Payer: No Typology Code available for payment source | Admitting: Family Medicine

## 2013-05-08 VITALS — BP 129/78 | HR 80 | Temp 98.4°F | Ht 70.0 in | Wt 205.0 lb

## 2013-05-08 DIAGNOSIS — M25512 Pain in left shoulder: Secondary | ICD-10-CM

## 2013-05-08 DIAGNOSIS — G8929 Other chronic pain: Secondary | ICD-10-CM

## 2013-05-08 DIAGNOSIS — E1165 Type 2 diabetes mellitus with hyperglycemia: Secondary | ICD-10-CM

## 2013-05-08 DIAGNOSIS — M25519 Pain in unspecified shoulder: Secondary | ICD-10-CM

## 2013-05-08 DIAGNOSIS — IMO0001 Reserved for inherently not codable concepts without codable children: Secondary | ICD-10-CM

## 2013-05-08 MED ORDER — METFORMIN HCL 1000 MG PO TABS: 500.0000 mg | ORAL_TABLET | Freq: Two times a day (BID) | ORAL | Status: DC

## 2013-05-08 MED ORDER — METFORMIN HCL 500 MG PO TABS: 500.0000 mg | ORAL_TABLET | Freq: Two times a day (BID) | ORAL | Status: DC

## 2013-05-08 MED ORDER — MELOXICAM 15 MG PO TABS: 15.0000 mg | ORAL_TABLET | Freq: Every day | ORAL | Status: DC

## 2013-05-08 NOTE — Assessment & Plan Note (Signed)
Out of control but improving.  Checking blood sugars now but didn't bring meter or log, encouraged log , Fastings approx 230 with minimum being 180 Last A1C >14.0, hospitalized in Nov with DKA (although he is type 2) Re-start metformin 500 daily for 1 week then 500 BID Continue novalog 4 u TID AC Increase BID lantus to 22 for 2 days then increase to 24 then to 25, Increasing each tim eif not at fasting of 150  Follow up 1 month

## 2013-05-08 NOTE — Assessment & Plan Note (Signed)
Improved after injection, still with pain at PT Previous hydrocodone but I am trying to avoid opiates with this chronic problem Mobic 15 mg on morning of PT, continue PT

## 2013-05-08 NOTE — Progress Notes (Signed)
  Subjective:    Patient ID: [name] [name], male    DOB: May 18, 1956, 57 y.o.   MRN: [id number]  HPI Here today for follow up of DM2 and shoulde pain  Shoulder pain imprved with shoulder injection Hurt that night and then began feeling better, hurts after PT but he si still going twice weekly Would like pain meds to help him through this time  DM2 Checking CBGs BID: Fasting 215, before dinner 215 Taking his lantus and novalog Had one hypoglycemic episode that included shaking and chills Sunday at church when he meant to eat and couldn't, still took his novalog before. He ate candy and  Stopped taking metformin due to GI intolerance, agrees to take lower dose Doesn't check feet, doesn't know of any foot sores Occasional tingling in BL toes  No dyspnea or chest pain Continued shoulder pain but improved No polyuria, no polydipsia  Review of Systems PEr hpi    Objective:   Physical Exam  Gen: NAD, alert, cooperative with exam HEENT: NCAT CV: RRR, good S1/S2, no murmur Resp: CTABL, no wheezes, non-labored Ext/diabetic foort exam: 2+ DP pulses BL, no open lesions, BL heels with large cracked calluses     Assessment & Plan:

## 2013-05-08 NOTE — Patient Instructions (Signed)
It was great to see you! Lets follow up in 1 month  Lets start taking metformin again at a lower dose, 500 mg once a day for a week then 500 mg twice a day Lets increase your Lantus like we talked about 22 units for the next two days, increase to 24 if your fasting glucose is still over 150. Then up to 24 and 25 as we discussed.

## 2013-05-12 ENCOUNTER — Ambulatory Visit: Payer: No Typology Code available for payment source

## 2013-05-18 ENCOUNTER — Ambulatory Visit: Payer: No Typology Code available for payment source | Admitting: Rehabilitation

## 2013-06-08 ENCOUNTER — Other Ambulatory Visit: Payer: Self-pay

## 2013-06-22 ENCOUNTER — Encounter: Payer: Self-pay | Admitting: Family Medicine

## 2013-07-21 ENCOUNTER — Inpatient Hospital Stay (HOSPITAL_COMMUNITY)
Admission: EM | Admit: 2013-07-21 | Discharge: 2013-07-24 | DRG: 639 | Disposition: A | Discharge status: Home / Self Care | Payer: No Typology Code available for payment source | Attending: Internal Medicine | Admitting: Internal Medicine

## 2013-07-21 ENCOUNTER — Inpatient Hospital Stay (HOSPITAL_COMMUNITY): Payer: No Typology Code available for payment source

## 2013-07-21 ENCOUNTER — Emergency Department (HOSPITAL_COMMUNITY): Payer: No Typology Code available for payment source

## 2013-07-21 ENCOUNTER — Encounter (HOSPITAL_COMMUNITY): Payer: Self-pay

## 2013-07-21 DIAGNOSIS — R0789 Other chest pain: Secondary | ICD-10-CM | POA: Diagnosis present

## 2013-07-21 DIAGNOSIS — K811 Chronic cholecystitis: Secondary | ICD-10-CM | POA: Diagnosis present

## 2013-07-21 DIAGNOSIS — R1013 Epigastric pain: Secondary | ICD-10-CM | POA: Diagnosis present

## 2013-07-21 DIAGNOSIS — M25512 Pain in left shoulder: Secondary | ICD-10-CM | POA: Diagnosis present

## 2013-07-21 DIAGNOSIS — R1110 Vomiting, unspecified: Secondary | ICD-10-CM | POA: Diagnosis present

## 2013-07-21 DIAGNOSIS — E1149 Type 2 diabetes mellitus with other diabetic neurological complication: Secondary | ICD-10-CM | POA: Diagnosis present

## 2013-07-21 DIAGNOSIS — K3184 Gastroparesis: Secondary | ICD-10-CM | POA: Diagnosis present

## 2013-07-21 DIAGNOSIS — G8929 Other chronic pain: Secondary | ICD-10-CM | POA: Diagnosis present

## 2013-07-21 DIAGNOSIS — M25519 Pain in unspecified shoulder: Secondary | ICD-10-CM | POA: Diagnosis present

## 2013-07-21 DIAGNOSIS — R112 Nausea with vomiting, unspecified: Secondary | ICD-10-CM | POA: Diagnosis present

## 2013-07-21 DIAGNOSIS — E1110 Type 2 diabetes mellitus with ketoacidosis without coma: Secondary | ICD-10-CM | POA: Diagnosis present

## 2013-07-21 DIAGNOSIS — E1165 Type 2 diabetes mellitus with hyperglycemia: Secondary | ICD-10-CM | POA: Diagnosis present

## 2013-07-21 DIAGNOSIS — E1310 Other specified diabetes mellitus with ketoacidosis without coma: Principal | ICD-10-CM | POA: Diagnosis present

## 2013-07-21 DIAGNOSIS — R109 Unspecified abdominal pain: Secondary | ICD-10-CM | POA: Diagnosis present

## 2013-07-21 LAB — CBC WITH DIFFERENTIAL/PLATELET
Basophils Absolute: 0 10*3/uL (ref 0.0–0.1)
Basophils Relative: 0 % (ref 0–1)
Eosinophils Absolute: 0 10*3/uL (ref 0.0–0.7)
Hemoglobin: 15.3 g/dL (ref 13.0–17.0)
MCH: 29.9 pg (ref 26.0–34.0)
MCHC: 34.9 g/dL (ref 30.0–36.0)
Monocytes Relative: 5 % (ref 3–12)
Neutrophils Relative %: 79 % — ABNORMAL HIGH (ref 43–77)
Platelets: 273 10*3/uL (ref 150–400)
RDW: 12.6 % (ref 11.5–15.5)

## 2013-07-21 LAB — GLUCOSE, CAPILLARY
Glucose-Capillary: 324 mg/dL — ABNORMAL HIGH (ref 70–99)
Glucose-Capillary: 287 mg/dL — ABNORMAL HIGH (ref 70–99)
Glucose-Capillary: 298 mg/dL — ABNORMAL HIGH (ref 70–99)

## 2013-07-21 LAB — URINALYSIS W MICROSCOPIC + REFLEX CULTURE
Bilirubin Urine: NEGATIVE
Hgb urine dipstick: NEGATIVE
Ketones, ur: >80 mg/dL — AB
Nitrite: NEGATIVE
pH: 5 (ref 5.0–8.0)

## 2013-07-21 LAB — BLOOD GAS, VENOUS
Acid-base deficit: 11.4 mmol/L — ABNORMAL HIGH (ref 0.0–2.0)
Bicarbonate: 12.2 mEq/L — ABNORMAL LOW (ref 20.0–24.0)
TCO2: 10.6 mmol/L (ref 0–100)
pCO2, Ven: 23.1 mmHg — ABNORMAL LOW (ref 45.0–50.0)
pH, Ven: 7.343 — ABNORMAL HIGH (ref 7.250–7.300)

## 2013-07-21 LAB — LIPASE, BLOOD: Lipase: 14 U/L (ref 11–59)

## 2013-07-21 LAB — COMPREHENSIVE METABOLIC PANEL
AST: 14 U/L (ref 0–37)
Albumin: 3.9 g/dL (ref 3.5–5.2)
Alkaline Phosphatase: 111 U/L (ref 39–117)
BUN: 19 mg/dL (ref 6–23)
Potassium: 5 mEq/L (ref 3.5–5.1)
Total Protein: 7.2 g/dL (ref 6.0–8.3)

## 2013-07-21 LAB — TROPONIN I: Troponin I: <0.3 ng/mL (ref <0.30)

## 2013-07-21 MED ORDER — GI COCKTAIL ~~LOC~~
30.0000 mL | Freq: Once | ORAL | Status: AC
  Administered 2013-07-21: 30 mL via ORAL
  Filled 2013-07-21: qty 30

## 2013-07-21 MED ORDER — DEXTROSE-NACL 5-0.45 % IV SOLN
INTRAVENOUS | Status: DC
  Administered 2013-07-21: 21:00:00 via INTRAVENOUS

## 2013-07-21 MED ORDER — ONDANSETRON HCL 4 MG/2ML IJ SOLN
4.0000 mg | Freq: Once | INTRAMUSCULAR | Status: AC
  Administered 2013-07-21: 4 mg via INTRAVENOUS
  Filled 2013-07-21: qty 2

## 2013-07-21 MED ORDER — INSULIN REGULAR HUMAN 100 UNIT/ML IJ SOLN
INTRAMUSCULAR | Status: DC
  Administered 2013-07-21: 2.3 [IU]/h via INTRAVENOUS
  Filled 2013-07-21: qty 1

## 2013-07-21 MED ORDER — RANITIDINE HCL 150 MG/10ML PO SYRP
150.0000 mg | ORAL_SOLUTION | Freq: Once | ORAL | Status: AC
  Administered 2013-07-21: 150 mg via ORAL
  Filled 2013-07-21: qty 10

## 2013-07-21 MED ORDER — HYDROCODONE-ACETAMINOPHEN 5-325 MG PO TABS
1.0000 | ORAL_TABLET | Freq: Once | ORAL | Status: AC
  Administered 2013-07-21: 1 via ORAL
  Filled 2013-07-21: qty 1

## 2013-07-21 MED ORDER — SODIUM CHLORIDE 0.9 % IV BOLUS (SEPSIS)
1000.0000 mL | INTRAVENOUS | Status: AC
  Administered 2013-07-21: 1000 mL via INTRAVENOUS

## 2013-07-21 NOTE — ED Notes (Signed)
Room 1228 assigned@[id number] 

## 2013-07-21 NOTE — ED Notes (Signed)
Went to bedside to speak with patient. Patient states that he does not want to be transferred to [facility] because he has not seen Family Practice patient in over a month. States "I am trying to get back to my original doctor." Dr. Romeo Apple spoke with patient regarding necessity of being transferred to [facility] for continuity of care. EDP further explained to patient that if he wanted to be discharged that he would be doing so against medical advice; patient verbalized understanding. Patient states he wants to discuss with his wife who is on her way.

## 2013-07-21 NOTE — ED Notes (Signed)
[id number] ASSIGNED@[id number] 

## 2013-07-21 NOTE — ED Notes (Signed)
cbg 264 

## 2013-07-21 NOTE — ED Provider Notes (Addendum)
CSN: [id number]     Arrival date & time 07/21/13  1632 History     First MD Initiated Contact with Patient 07/21/13 1644     Chief Complaint  Patient presents with  . Hyperglycemia  . Nausea   (Consider location/radiation/quality/duration/timing/severity/associated sxs/prior Treatment) Patient is a 57 y.o. male presenting with hyperglycemia. The history is provided by the patient.  Hyperglycemia Severity:  Moderate Onset quality:  Gradual Duration:  2 days Timing:  Constant Progression:  Improving Chronicity:  New Diabetes status:  Controlled with insulin Time since last antidiabetic medication:  4 hours Relieved by:  Insulin Ineffective treatments:  None tried Associated symptoms: abdominal pain, nausea and vomiting   Associated symptoms: no chest pain, no dysuria, no fever and no shortness of breath     Past Medical History  Diagnosis Date  . Diabetes mellitus   . Hypercholesterolemia   . GERD (gastroesophageal reflux disease)   . Hypertension   . Hypercholesteremia     hx of  . Hemorrhoids     hx of  . Peripheral neuropathy   . Depression    Past Surgical History  Procedure Laterality Date  . Tonsillectomy     Family History  Problem Relation Age of Onset  . Hypertension Mother   . Alzheimer's disease Mother   . Alcohol abuse Father   . Diabetes type II Brother    History  Substance Use Topics  . Smoking status: Former Smoker -- 0.25 packs/day for 3 years    Quit date: 11/30/1998  . Smokeless tobacco: Never Used  . Alcohol Use: No    Review of Systems  Constitutional: Negative for fever.  HENT: Negative for rhinorrhea, drooling and neck pain.   Eyes: Negative for pain.  Respiratory: Negative for cough and shortness of breath.   Cardiovascular: Negative for chest pain and leg swelling.  Gastrointestinal: Positive for nausea, vomiting and abdominal pain. Negative for diarrhea.  Genitourinary: Negative for dysuria and hematuria.  Musculoskeletal:  Negative for gait problem.  Skin: Negative for color change.  Neurological: Negative for numbness and headaches.  Hematological: Negative for adenopathy.  Psychiatric/Behavioral: Negative for behavioral problems.  All other systems reviewed and are negative.    Allergies  Review of patient's allergies indicates no known allergies.  Home Medications   Current Outpatient Rx  Name  Route  Sig  Dispense  Refill  . aspirin EC 81 MG tablet   Oral   Take 1 tablet (81 mg total) by mouth daily.   30 tablet   0   . buPROPion (WELLBUTRIN SR) 150 MG 12 hr tablet   Oral   Take 1 tablet (150 mg total) by mouth daily.   30 tablet   0   . HYDROcodone-acetaminophen (NORCO/VICODIN) 5-325 MG per tablet   Oral   Take 1 tablet by mouth every 6 (six) hours as needed. pain   30 tablet   0   . insulin NPH-regular (NOVOLIN 70/30) (70-30) 100 UNIT/ML injection   Subcutaneous   Inject 30 Units into the skin daily with breakfast.         . metFORMIN (GLUCOPHAGE) 500 MG tablet   Oral   Take 1 tablet (500 mg total) by mouth 2 (two) times daily with a meal.   60 tablet   2   . glucose blood test strip      Use as instructed   100 each   12   . insulin aspart (NOVOLOG FLEXPEN) 100 UNIT/ML injection  Subcutaneous   Inject 4 Units into the skin 3 (three) times daily before meals.   5 pen   11   . insulin aspart (NOVOLOG) 100 UNIT/ML injection   Subcutaneous   Inject 4 Units into the skin 3 (three) times daily before meals.   1 vial   0   . Insulin Glargine (LANTUS SOLOSTAR) 100 UNIT/ML SOPN   Subcutaneous   Inject 20 Units into the skin 2 (two) times daily.   5 pen   11   . Insulin Glargine (LANTUS SOLOSTAR) 100 UNIT/ML SOPN   Subcutaneous   Inject 20 Units into the skin 2 (two) times daily.   10 mL   0   . pantoprazole (PROTONIX) 40 MG tablet   Oral   Take 1 tablet (40 mg total) by mouth daily.   30 tablet   0   . zolpidem (AMBIEN) 10 MG tablet   Oral   Take 1  tablet (10 mg total) by mouth at bedtime as needed for sleep. Sleep.   30 tablet   0    BP 112/71  Pulse 112  Temp(Src) 99 F (37.2 C) (Oral)  Resp 18  SpO2 98% Physical Exam  Nursing note and vitals reviewed. Constitutional: He is oriented to person, place, and time. He appears well-developed and well-nourished.  HENT:  Head: Normocephalic and atraumatic.  Right Ear: External ear normal.  Left Ear: External ear normal.  Nose: Nose normal.  Mouth/Throat: Oropharynx is clear and moist. No oropharyngeal exudate.  Eyes: Conjunctivae and EOM are normal. Pupils are equal, round, and reactive to light.  Neck: Normal range of motion. Neck supple.  Cardiovascular: Regular rhythm, normal heart sounds and intact distal pulses.  Exam reveals no gallop and no friction rub.   No murmur heard. Sinus tachycardia, HR 112  Pulmonary/Chest: Effort normal and breath sounds normal. No respiratory distress. He has no wheezes.  Abdominal: Soft. Bowel sounds are normal. He exhibits no distension. There is tenderness (mild ttp of RUQ). There is no rebound and no guarding.  Musculoskeletal: Normal range of motion. He exhibits no edema and no tenderness.  Neurological: He is alert and oriented to person, place, and time.  Skin: Skin is warm and dry.  Psychiatric: He has a normal mood and affect. His behavior is normal.    ED Course   Procedures (including critical care time)  Labs Reviewed  GLUCOSE, CAPILLARY - Abnormal; Notable for the following:    Glucose-Capillary 324 (*)    All other components within normal limits  CBC WITH DIFFERENTIAL - Abnormal; Notable for the following:    Neutrophils Relative % 79 (*)    All other components within normal limits  COMPREHENSIVE METABOLIC PANEL - Abnormal; Notable for the following:    Sodium 132 (*)    CO2 13 (*)    Glucose, Bld 359 (*)    GFR calc non Af Amer 71 (*)    GFR calc Af Amer 83 (*)    All other components within normal limits   URINALYSIS W MICROSCOPIC + REFLEX CULTURE - Abnormal; Notable for the following:    Specific Gravity, Urine 1.032 (*)    Glucose, UA >1000 (*)    Ketones, ur >80 (*)    All other components within normal limits  BLOOD GAS, VENOUS - Abnormal; Notable for the following:    pH, Ven 7.343 (*)    pCO2, Ven 23.1 (*)    pO2, Ven 146.0 (*)    Bicarbonate  12.2 (*)    Acid-base deficit 11.4 (*)    All other components within normal limits  GLUCOSE, CAPILLARY - Abnormal; Notable for the following:    Glucose-Capillary 287 (*)    All other components within normal limits  GLUCOSE, CAPILLARY - Abnormal; Notable for the following:    Glucose-Capillary 298 (*)    All other components within normal limits  LIPASE, BLOOD  TROPONIN I   US Abdomen Complete  07/21/2013   *RADIOLOGY REPORT*  Clinical Data:  Right upper quadrant abdominal pain.  COMPLETE ABDOMINAL ULTRASOUND  Comparison:  CT abdomen and pelvis 05/08/2012.  10/24/2010.  Findings:  Gallbladder:  Contracted with wall thickening measuring up to 4 mm. No visible shadowing gallstones or echogenic sludge.  No pericholecystic fluid.  Negative sonographic Murphy's sign according to the ultrasound technologist.  Common bile duct:  Normal in caliber with maximum diameter approximating 7 mm.  Liver:  No focal mass lesion seen.  Within normal limits in parenchymal echogenicity.  IVC:  Patent.  Pancreas:  Although the pancreas is difficult to visualize in its entirety, no focal pancreatic abnormality is identified.  The head was obscured by overlying bowel gas.  Spleen:  Normal size and echotexture without focal parenchymal abnormality.  Right Kidney:  No hydronephrosis.  Well-preserved cortex.  No shadowing calculi.  Normal size and parenchymal echotexture without focal abnormalities.  Approximately 9.2 cm in length.  Left Kidney:  No hydronephrosis.  Well-preserved cortex.  No shadowing calculi.  Normal size and parenchymal echotexture without focal  abnormalities.  Approximately 11.1 cm in length.  Abdominal aorta:  Normal in caliber throughout its visualized course in the abdomen without significant atherosclerosis.  IMPRESSION:  1.  Markedly contracted gallbladder accounting for the mild wall thickening.  This can be a manifestation of chronic cholecystitis or may just be related to a recent meal.  Absent sonographic Murphy's sign suggests against acute cholecystitis. 2.  Otherwise normal examination with a caveat that the pancreatic head was obscured by overlying bowel gas was therefore not evaluated.   Original Report Authenticated By: Hulan Saas, M.D.   [facility] 1 View  07/21/2013   *RADIOLOGY REPORT*  Clinical Data: Chest pain.  High blood sugar.  PORTABLE CHEST - 1 VIEW  Comparison: 10/01/2012  Findings: The heart size and pulmonary vascularity are normal. The lungs appear clear and expanded without focal air space disease or consolidation. No blunting of the costophrenic angles.  No pneumothorax.  Mediastinal contours appear intact.  No significant changes since previous study.  IMPRESSION: No evidence of active pulmonary disease.   Original Report Authenticated By: Burman Nieves, M.D.   1. DKA (diabetic ketoacidoses)   2. Nausea & vomiting   3. Vomiting   4. Left shoulder pain   5. Abdominal pain      Date: 07/21/2013  Rate: 97  Rhythm: normal sinus rhythm  QRS Axis: normal  Intervals: normal  ST/T Wave abnormalities: nonspecific ST changes  Conduction Disutrbances:none  Narrative Interpretation: <20mm j point elevation in inferior leads  Old EKG Reviewed: changes noted  CRITICAL CARE Performed by: Purvis Sheffield, S Total critical care time: 30 Critical care time was exclusive of separately billable procedures and treating other patients. Critical care was necessary to treat or prevent imminent or life-threatening deterioration. Critical care was time spent personally by me on the following activities:  development of treatment plan with patient and/or surrogate as well as nursing, discussions with consultants, evaluation of patient's response to treatment, examination of  patient, obtaining history from patient or surrogate, ordering and performing treatments and interventions, ordering and review of laboratory studies, ordering and review of radiographic studies, pulse oximetry and re-evaluation of patient's condition.  MDM  5:03 PM 57 y.o. male w hx of DM, HLP, HTN who pw hyperglycemia. BS found to be 600 today, usually runs 250-300. Pt took 30U of 70/30 several hours ago. Pt AFVSS here, nauseous, mild diffuse abd pain which localizes to RUQ, pt has had this for weeks. Will get labs, IVF, [place]. Will get ecg/troponin d/t possible atypical presentation of ACS, but low suspicion for this and no cp.   Will admit for DKA. Pt refused transfer to [facility] for admission to teaching service who he follows with. Hospitalist agreed to admit him here.   Critical care time includes focused medical decision making, consultation with multiple services to arrange patients admission, multiple rechecks, monitored titration of insulin gtt.   Junius Argyle, MD 07/22/13 [id number]  Junius Argyle, MD 07/22/13 [phone]

## 2013-07-21 NOTE — ED Notes (Signed)
[place], MD at bedside 

## 2013-07-21 NOTE — H&P (Signed)
[facility] History and Physical  [name] [name] ZOX:[id number] DOB: 11-20-1956 DOA: 07/21/2013  Referring physician: ER physician. PCP: Ervin Fenton, MD patient usually seen by family practice at [facility] but patient is refusing to go to [facility].  Chief Complaint: Weakness and nausea vomiting.  HPI: [name] [name] is a 57 y.o. male with known history of diabetes mellitus presented to the ER because of patient not feeling well. In addition patient has been having some nausea vomiting abdominal discomfort. Patient has been having these symptoms for last 2-3 days. Patient said that he was unable to keep because of nausea vomiting with burning sensation in the abdomen particularly in the epigastric area. Denies any diarrhea fever chills. Denies any shortness of breath with has been having some burning sensation in his chest. In the ER patient was found to have elevated blood sugar with anion gap metabolic acidosis. Patient has been limited for DKA. Patient states he's been compliant with his diabetic medication. His wife states that his blood sugar has been running around 150-300 every day. In the ER due to his nausea vomiting sonogram was done which also shows a hospital of chronic cholecystitis.  Review of Systems: As presented in the history of presenting illness, rest negative.  Past Medical History  Diagnosis Date  . Diabetes mellitus   . Hypercholesterolemia   . GERD (gastroesophageal reflux disease)   . Hypertension   . Hypercholesteremia     hx of  . Hemorrhoids     hx of  . Peripheral neuropathy   . Depression    Past Surgical History  Procedure Laterality Date  . Tonsillectomy     Social History:  reports that he quit smoking about 14 years ago. He has never used smokeless tobacco. He reports that he does not drink alcohol or use illicit drugs. Home. where does patient live-- Can do ADLs. Can patient participate in ADLs?  No Known Allergies  Family History   Problem Relation Age of Onset  . Hypertension Mother   . Alzheimer's disease Mother   . Alcohol abuse Father   . Diabetes type II Brother       Prior to Admission medications   Medication Sig Start Date End Date Taking? Authorizing Provider  aspirin EC 81 MG tablet Take 1 tablet (81 mg total) by mouth daily. 10/04/12  Yes Alison Murray, MD  buPROPion [facility] SR) 150 MG 12 hr tablet Take 1 tablet (150 mg total) by mouth daily. 10/04/12  Yes Alison Murray, MD  HYDROcodone-acetaminophen (NORCO/VICODIN) 5-325 MG per tablet Take 1 tablet by mouth every 6 (six) hours as needed. pain 10/04/12  Yes Alison Murray, MD  insulin NPH-regular (NOVOLIN 70/30) (70-30) 100 UNIT/ML injection Inject 30 Units into the skin daily with breakfast.   Yes Historical Provider, MD  metFORMIN (GLUCOPHAGE) 500 MG tablet Take 1 tablet (500 mg total) by mouth 2 (two) times daily with a meal. 05/08/13  Yes Elenora Gamma, MD  glucose blood test strip Use as instructed 04/27/13   Elenora Gamma, MD  insulin aspart (NOVOLOG FLEXPEN) 100 UNIT/ML injection Inject 4 Units into the skin 3 (three) times daily before meals. 04/06/13   Elenora Gamma, MD  insulin aspart (NOVOLOG) 100 UNIT/ML injection Inject 4 Units into the skin 3 (three) times daily before meals. 04/06/13   Elenora Gamma, MD  Insulin Glargine (LANTUS SOLOSTAR) 100 UNIT/ML SOPN Inject 20 Units into the skin 2 (two) times daily. 04/06/13   Remi Deter  Delphina Cahill, MD  Insulin Glargine (LANTUS SOLOSTAR) 100 UNIT/ML SOPN Inject 20 Units into the skin 2 (two) times daily. 04/06/13   Elenora Gamma, MD  pantoprazole (PROTONIX) 40 MG tablet Take 1 tablet (40 mg total) by mouth daily. 10/04/12   Alison Murray, MD  zolpidem (AMBIEN) 10 MG tablet Take 1 tablet (10 mg total) by mouth at bedtime as needed for sleep. Sleep. 10/04/12   Alison Murray, MD   Physical Exam: Filed Vitals:   07/21/13 1644 07/21/13 2100 07/21/13 2104  BP: 112/71  128/74  Pulse: 112 96 95  Temp: 99  F (37.2 C)    TempSrc: Oral    Resp: 18 13 17   SpO2: 98% 97% 97%     General:  Well-developed well-nourished.  Eyes: Anicteric no pallor.  ENT: No discharge from the ears eyes nose mouth.  Neck: No mass felt.  Cardiovascular: S1-S2 heard.  Respiratory: No rhonchi or crepitations.  Abdomen: Soft mild epigastric tenderness no guarding or rigidity.  Skin: No rash.  Musculoskeletal: No edema.  Psychiatric: Appears normal.  Neurologic: Alert awake oriented to time place and person. Moves all extremities.  Labs on Admission:  Basic Metabolic Panel:  Recent Labs Lab 07/21/13 1740  NA 132*  K 5.0  CL 98  CO2 13*  GLUCOSE 359*  BUN 19  CREATININE 1.13  CALCIUM 10.0   Liver Function Tests:  Recent Labs Lab 07/21/13 1740  AST 14  ALT 9  ALKPHOS 111  BILITOT 0.5  PROT 7.2  ALBUMIN 3.9    Recent Labs Lab 07/21/13 1740  LIPASE 14   No results found for this basename: AMMONIA,  in the last 168 hours CBC:  Recent Labs Lab 07/21/13 1740  WBC 8.3  NEUTROABS 6.5  HGB 15.3  HCT 43.8  MCV 85.7  PLT 273   Cardiac Enzymes:  Recent Labs Lab 07/21/13 1740  TROPONINI <0.30    BNP (last 3 results) No results found for this basename: PROBNP,  in the last 8760 hours CBG:  Recent Labs Lab 07/21/13 1640 07/21/13 2044 07/21/13 2206  GLUCAP 324* 287* 298*    Radiological Exams on Admission: US Abdomen Complete  07/21/2013   *RADIOLOGY REPORT*  Clinical Data:  Right upper quadrant abdominal pain.  COMPLETE ABDOMINAL ULTRASOUND  Comparison:  CT abdomen and pelvis 05/08/2012.  10/24/2010.  Findings:  Gallbladder:  Contracted with wall thickening measuring up to 4 mm. No visible shadowing gallstones or echogenic sludge.  No pericholecystic fluid.  Negative sonographic Murphy's sign according to the ultrasound technologist.  Common bile duct:  Normal in caliber with maximum diameter approximating 7 mm.  Liver:  No focal mass lesion seen.  Within normal  limits in parenchymal echogenicity.  IVC:  Patent.  Pancreas:  Although the pancreas is difficult to visualize in its entirety, no focal pancreatic abnormality is identified.  The head was obscured by overlying bowel gas.  Spleen:  Normal size and echotexture without focal parenchymal abnormality.  Right Kidney:  No hydronephrosis.  Well-preserved cortex.  No shadowing calculi.  Normal size and parenchymal echotexture without focal abnormalities.  Approximately 9.2 cm in length.  Left Kidney:  No hydronephrosis.  Well-preserved cortex.  No shadowing calculi.  Normal size and parenchymal echotexture without focal abnormalities.  Approximately 11.1 cm in length.  Abdominal aorta:  Normal in caliber throughout its visualized course in the abdomen without significant atherosclerosis.  IMPRESSION:  1.  Markedly contracted gallbladder accounting for the mild wall thickening.  This can be a manifestation of chronic cholecystitis or may just be related to a recent meal.  Absent sonographic Murphy's sign suggests against acute cholecystitis. 2.  Otherwise normal examination with a caveat that the pancreatic head was obscured by overlying bowel gas was therefore not evaluated.   Original Report Authenticated By: Hulan Saas, M.D.    EKG: Independently reviewed. Normal sinus rhythm.  Assessment/Plan Principal Problem:   DKA (diabetic ketoacidoses) Active Problems:   DM (diabetes mellitus), type 2, uncontrolled   Chronic pain in left shoulder   Nausea & vomiting   1. Diabetic ketoacidosis - at this time not sure what precipitated the diabetic ketoacidosis. Continue with IV insulin until DKA is corrected. IV fluids. Check hemoglobin A1c. 2. Nausea vomiting and abdominal discomfort - most likely secondary DKA and gastroparesis. Check HIDA scan for cholecystitis. PPIs. 3. Chronic left shoulder pain. 4. Atypical chest pain - most likely secondary to nausea vomiting. Since patient is in DKA we'll check cycle  cardiac markers.    Code Status: full code.  Family Communication: patient's wife at the bedside.  Disposition Plan: admit to inpatient.    Debra Colon N. [facility] Pager [phone].  If 7PM-7AM, please contact night-coverage [email or web address] Password [facility] 07/21/2013, 11:03 PM

## 2013-07-21 NOTE — ED Notes (Signed)
Patient reports that he has had nausea, but did not have any test strips to check his glucose. Patient reports that he took 30 units 70/30 insulin a couple of hours ago, but is still feeling weak and nauseated.

## 2013-07-21 NOTE — ED Notes (Signed)
Pt using urinal now. Reports increase in left sided shoulder pain and nausea. Skin hot- oral temp 99.4. Will obtain rectal temp. MD aware

## 2013-07-22 DIAGNOSIS — M25519 Pain in unspecified shoulder: Secondary | ICD-10-CM

## 2013-07-22 DIAGNOSIS — R109 Unspecified abdominal pain: Secondary | ICD-10-CM

## 2013-07-22 LAB — HEPATIC FUNCTION PANEL
ALT: 6 U/L (ref 0–53)
AST: 8 U/L (ref 0–37)
Albumin: 3.2 g/dL — ABNORMAL LOW (ref 3.5–5.2)
Alkaline Phosphatase: 88 U/L (ref 39–117)
Bilirubin, Direct: <0.1 mg/dL (ref 0.0–0.3)
Total Bilirubin: 0.4 mg/dL (ref 0.3–1.2)

## 2013-07-22 LAB — GLUCOSE, CAPILLARY
Glucose-Capillary: 88 mg/dL (ref 70–99)
Glucose-Capillary: 116 mg/dL — ABNORMAL HIGH (ref 70–99)
Glucose-Capillary: 145 mg/dL — ABNORMAL HIGH (ref 70–99)
Glucose-Capillary: 156 mg/dL — ABNORMAL HIGH (ref 70–99)
Glucose-Capillary: 189 mg/dL — ABNORMAL HIGH (ref 70–99)
Glucose-Capillary: 213 mg/dL — ABNORMAL HIGH (ref 70–99)
Glucose-Capillary: 97 mg/dL (ref 70–99)
Glucose-Capillary: 100 mg/dL — ABNORMAL HIGH (ref 70–99)
Glucose-Capillary: 140 mg/dL — ABNORMAL HIGH (ref 70–99)
Glucose-Capillary: 151 mg/dL — ABNORMAL HIGH (ref 70–99)
Glucose-Capillary: 241 mg/dL — ABNORMAL HIGH (ref 70–99)

## 2013-07-22 LAB — CBC WITH DIFFERENTIAL/PLATELET
Basophils Relative: 0 % (ref 0–1)
Eosinophils Absolute: 0.1 10*3/uL (ref 0.0–0.7)
Eosinophils Relative: 1 % (ref 0–5)
Lymphs Abs: 2.8 10*3/uL (ref 0.7–4.0)
MCH: 29.6 pg (ref 26.0–34.0)
MCHC: 34.9 g/dL (ref 30.0–36.0)
MCV: 85 fL (ref 78.0–100.0)
Neutrophils Relative %: 53 % (ref 43–77)
Platelets: 249 10*3/uL (ref 150–400)

## 2013-07-22 LAB — BASIC METABOLIC PANEL
BUN: 17 mg/dL (ref 6–23)
BUN: 14 mg/dL (ref 6–23)
CO2: 20 mEq/L (ref 19–32)
Calcium: 9.7 mg/dL (ref 8.4–10.5)
Calcium: 9.7 mg/dL (ref 8.4–10.5)
Calcium: 9.1 mg/dL (ref 8.4–10.5)
Calcium: 8.8 mg/dL (ref 8.4–10.5)
Calcium: 8.8 mg/dL (ref 8.4–10.5)
Chloride: 111 mEq/L (ref 96–112)
Chloride: 108 mEq/L (ref 96–112)
Chloride: 109 mEq/L (ref 96–112)
Chloride: 108 mEq/L (ref 96–112)
Chloride: 110 mEq/L (ref 96–112)
Creatinine, Ser: 0.93 mg/dL (ref 0.50–1.35)
Creatinine, Ser: 0.92 mg/dL (ref 0.50–1.35)
Creatinine, Ser: 0.91 mg/dL (ref 0.50–1.35)
GFR calc Af Amer: >90 mL/min (ref >90)
GFR calc Af Amer: >90 mL/min (ref >90)
GFR calc Af Amer: >90 mL/min (ref >90)
GFR calc Af Amer: >90 mL/min (ref >90)
GFR calc Af Amer: >90 mL/min (ref >90)
GFR calc Af Amer: >90 mL/min (ref >90)
GFR calc non Af Amer: >90 mL/min (ref >90)
GFR calc non Af Amer: >90 mL/min (ref >90)
GFR calc non Af Amer: >90 mL/min (ref >90)
GFR calc non Af Amer: >90 mL/min (ref >90)
GFR calc non Af Amer: >90 mL/min (ref >90)
Glucose, Bld: 166 mg/dL — ABNORMAL HIGH (ref 70–99)
Potassium: 3.8 mEq/L (ref 3.5–5.1)
Potassium: 4.9 mEq/L (ref 3.5–5.1)
Potassium: 4.4 mEq/L (ref 3.5–5.1)
Potassium: 4.4 mEq/L (ref 3.5–5.1)
Potassium: 3.7 mEq/L (ref 3.5–5.1)
Potassium: 3.4 mEq/L — ABNORMAL LOW (ref 3.5–5.1)
Sodium: 134 mEq/L — ABNORMAL LOW (ref 135–145)
Sodium: 138 mEq/L (ref 135–145)
Sodium: 134 mEq/L — ABNORMAL LOW (ref 135–145)
Sodium: 134 mEq/L — ABNORMAL LOW (ref 135–145)
Sodium: 133 mEq/L — ABNORMAL LOW (ref 135–145)
Sodium: 133 mEq/L — ABNORMAL LOW (ref 135–145)
Sodium: 136 mEq/L (ref 135–145)

## 2013-07-22 LAB — CBC
Platelets: 243 10*3/uL (ref 150–400)
RBC: 4.46 MIL/uL (ref 4.22–5.81)
RDW: 12.9 % (ref 11.5–15.5)
WBC: 7.6 10*3/uL (ref 4.0–10.5)

## 2013-07-22 LAB — TROPONIN I
Troponin I: <0.3 ng/mL (ref <0.30)
Troponin I: <0.3 ng/mL (ref <0.30)
Troponin I: <0.3 ng/mL (ref <0.30)

## 2013-07-22 LAB — LACTIC ACID, PLASMA: Lactic Acid, Venous: 0.6 mmol/L (ref 0.5–2.2)

## 2013-07-22 LAB — MRSA PCR SCREENING: MRSA by PCR: NEGATIVE

## 2013-07-22 MED ORDER — INSULIN GLARGINE 100 UNIT/ML ~~LOC~~ SOLN: 20.0000 [IU] | Freq: Every day | SUBCUTANEOUS | Status: DC

## 2013-07-22 MED ORDER — SODIUM CHLORIDE 0.9 % IV BOLUS (SEPSIS)
500.0000 mL | Freq: Once | INTRAVENOUS | Status: AC
  Administered 2013-07-22: 500 mL via INTRAVENOUS

## 2013-07-22 MED ORDER — INSULIN ASPART 100 UNIT/ML ~~LOC~~ SOLN
2.0000 [IU] | Freq: Once | SUBCUTANEOUS | Status: AC
  Administered 2013-07-22: 2 [IU] via SUBCUTANEOUS

## 2013-07-22 MED ORDER — HYDROCODONE-ACETAMINOPHEN 5-325 MG PO TABS
1.0000 | ORAL_TABLET | Freq: Four times a day (QID) | ORAL | Status: DC | PRN
  Administered 2013-07-22 – 2013-07-24 (×7): 1 via ORAL
  Filled 2013-07-22 (×7): qty 1

## 2013-07-22 MED ORDER — DEXTROSE 50 % IV SOLN: 25.0000 mL | INTRAVENOUS | Status: DC | PRN

## 2013-07-22 MED ORDER — DEXTROSE-NACL 5-0.45 % IV SOLN
INTRAVENOUS | Status: DC
  Administered 2013-07-22 (×3): via INTRAVENOUS
  Administered 2013-07-22: 150 mL/h via INTRAVENOUS
  Administered 2013-07-22: 04:00:00 via INTRAVENOUS

## 2013-07-22 MED ORDER — HYDROCODONE-ACETAMINOPHEN 5-325 MG PO TABS
2.0000 | ORAL_TABLET | Freq: Once | ORAL | Status: AC
  Administered 2013-07-22: 2 via ORAL
  Filled 2013-07-22: qty 2

## 2013-07-22 MED ORDER — POTASSIUM CHLORIDE CRYS ER 20 MEQ PO TBCR
40.0000 meq | EXTENDED_RELEASE_TABLET | Freq: Two times a day (BID) | ORAL | Status: DC
  Administered 2013-07-22: 40 meq via ORAL
  Filled 2013-07-22 (×4): qty 2

## 2013-07-22 MED ORDER — POTASSIUM CHLORIDE 10 MEQ/100ML IV SOLN: 10.0000 meq | INTRAVENOUS | Status: DC

## 2013-07-22 MED ORDER — INSULIN GLARGINE 100 UNIT/ML ~~LOC~~ SOLN
20.0000 [IU] | SUBCUTANEOUS | Status: DC
  Administered 2013-07-22: 20 [IU] via SUBCUTANEOUS
  Filled 2013-07-22 (×2): qty 0.2

## 2013-07-22 MED ORDER — ENOXAPARIN SODIUM 40 MG/0.4ML ~~LOC~~ SOLN
40.0000 mg | SUBCUTANEOUS | Status: DC
  Administered 2013-07-22 – 2013-07-24 (×3): 40 mg via SUBCUTANEOUS
  Filled 2013-07-22 (×3): qty 0.4

## 2013-07-22 MED ORDER — PANTOPRAZOLE SODIUM 40 MG IV SOLR
40.0000 mg | Freq: Every day | INTRAVENOUS | Status: DC
  Filled 2013-07-22 (×2): qty 40

## 2013-07-22 MED ORDER — SODIUM CHLORIDE 0.9 % IV SOLN
INTRAVENOUS | Status: DC
  Administered 2013-07-22 – 2013-07-23 (×2): 125 mL/h via INTRAVENOUS
  Administered 2013-07-23 – 2013-07-24 (×2): via INTRAVENOUS

## 2013-07-22 MED ORDER — PANTOPRAZOLE SODIUM 40 MG IV SOLR
40.0000 mg | Freq: Every day | INTRAVENOUS | Status: DC
  Administered 2013-07-22: 40 mg via INTRAVENOUS
  Filled 2013-07-22: qty 40

## 2013-07-22 MED ORDER — PIPERACILLIN-TAZOBACTAM 3.375 G IVPB
3.3750 g | Freq: Three times a day (TID) | INTRAVENOUS | Status: DC
  Administered 2013-07-22 – 2013-07-23 (×4): 3.375 g via INTRAVENOUS
  Filled 2013-07-22 (×4): qty 50

## 2013-07-22 MED ORDER — INSULIN ASPART 100 UNIT/ML ~~LOC~~ SOLN
0.0000 [IU] | Freq: Three times a day (TID) | SUBCUTANEOUS | Status: DC
  Administered 2013-07-23 – 2013-07-24 (×2): 2 [IU] via SUBCUTANEOUS

## 2013-07-22 MED ORDER — SODIUM CHLORIDE 0.9 % IV SOLN: INTRAVENOUS | Status: DC

## 2013-07-22 MED ORDER — SODIUM CHLORIDE 0.9 % IV SOLN
INTRAVENOUS | Status: DC
  Administered 2013-07-22: 1.2 [IU]/h via INTRAVENOUS
  Administered 2013-07-22: 6.1 [IU]/h via INTRAVENOUS
  Administered 2013-07-22: 0.8 [IU]/h via INTRAVENOUS

## 2013-07-22 MED ORDER — POTASSIUM CHLORIDE 10 MEQ/100ML IV SOLN
10.0000 meq | INTRAVENOUS | Status: AC
  Administered 2013-07-22 (×2): 10 meq via INTRAVENOUS
  Filled 2013-07-22: qty 200

## 2013-07-22 MED ORDER — PANTOPRAZOLE SODIUM 40 MG IV SOLR
40.0000 mg | Freq: Two times a day (BID) | INTRAVENOUS | Status: DC
  Administered 2013-07-22 – 2013-07-24 (×4): 40 mg via INTRAVENOUS
  Filled 2013-07-22 (×5): qty 40

## 2013-07-22 NOTE — Progress Notes (Signed)
ANTIBIOTIC CONSULT NOTE - INITIAL  Pharmacy Consult for Zosyn Indication: ?abdominal infection/cholecystitis  No Known Allergies  Patient Measurements: Height: 5\' 9"  (175.3 cm) Weight: 190 lb 0.6 oz (86.2 kg) IBW/kg (Calculated) : 70.7  Vital Signs: Temp: 97.9 F (36.6 C) (08/23 0400) Temp src: Oral (08/23 0400) BP: 93/51 mmHg (08/23 0700) Pulse Rate: 70 (08/23 0700) Intake/Output from previous day: 08/22 0701 - 08/23 0700 In: 1087.6 [I.V.:887.6; IV Piggyback:200] Out: 700 [Urine:700] Intake/Output from this shift:    Labs:  Recent Labs  07/21/13 1740 07/22/13 0120 07/22/13 0320 07/22/13 0515  WBC 8.3 7.6 7.9  --   HGB 15.3 13.3 13.6  --   PLT 273 243 249  --   CREATININE 1.13 0.95 0.93 0.92   Estimated Creatinine Clearance: 98.7 ml/min (by C-G formula based on Cr of 0.92). No results found for this basename: VANCOTROUGH, Leodis Binet, VANCORANDOM, GENTTROUGH, GENTPEAK, GENTRANDOM, TOBRATROUGH, TOBRAPEAK, TOBRARND, AMIKACINPEAK, AMIKACINTROU, AMIKACIN,  in the last 72 hours   Microbiology: Recent Results (from the past 720 hour(s))  MRSA PCR SCREENING     Status: None   Collection Time    07/22/13 12:23 AM      Result Value Range Status   MRSA by PCR NEGATIVE  NEGATIVE Final   Comment:            The GeneXpert MRSA Assay (FDA     approved for NASAL specimens     only), is one component of a     comprehensive MRSA colonization     surveillance program. It is not     intended to diagnose MRSA     infection nor to guide or     monitor treatment for     MRSA infections.    Medical History: Past Medical History  Diagnosis Date  . Diabetes mellitus   . Hypercholesterolemia   . GERD (gastroesophageal reflux disease)   . Hypertension   . Hypercholesteremia     hx of  . Hemorrhoids     hx of  . Peripheral neuropathy   . Depression     Medications:  Scheduled:  . enoxaparin (LOVENOX) injection  40 mg Subcutaneous Q24H  . pantoprazole (PROTONIX) IV  40  mg Intravenous Q12H  . sodium chloride  500 mL Intravenous Once   Infusions:  . sodium chloride    . dextrose 5 % and 0.45% NaCl 125 mL/hr at 07/22/13 0400  . insulin (NOVOLIN-R) infusion 0.3 Units/hr (07/22/13 0400)   Assessment: 57yo male admitted with DKA on insulin drip along with N/V and abdominal discomfort  To start Zosyn empirically for possible cholecystitis  Goal of Therapy:  Zosyn adjustment per renal infection  Plan:  Zosyn 3.375g IV q8 (extended interval infusion)   Hessie Knows, PharmD, BCPS Pager [phone] 07/22/2013 7:47 AM

## 2013-07-22 NOTE — Progress Notes (Signed)
[facility] PROGRESS NOTE  [name] [name] RUE:[id number] DOB: 10-Dec-1955 DOA: 07/21/2013 PCP: Elyon Fenton, MD  Assessment/Plan: 1-Metabolic acidosis: blood sugar has decrease to 90, gap at 11, bicarb at 15. This could be DKA but also sepsis is in the differential. Will check stat lactic acid. Will start empirically antibiotics. IV fluids. Repeat blood sugar, resume insulin Gtt. Insulin gtt has been on hold due to low blood sugar.   2-Diabetes/ DKA: resume insulin Gtt, D 5 half NS. Serial B-met.   3-Abdominal pain, nausea and vomiting: this could be secondary to DKA. [place] with questions of chronic cholecystitis. HIDA scan ordered. Start Zosyn. Continue with protonix. Abdominal pain is more epigastric area, no RUQ. Pain has improved.   4-Atypical chest pain: denies chest pain. Troponin time 2 negative.   Code Status: Full Code.  Family Communication: Care discussed with patient.  Disposition Plan: remain step down unit.    Consultants:  none  Procedures:  HIDA scan Pending.   Antibiotics:  Zosyn 8-23  HPI/Subjective: He relates epigastric abdominal pain has improved. Still complaining of his chronic left shoulder pain.   Objective: Filed Vitals:   07/22/13 0600  BP: 97/56  Pulse: 73  Temp:   Resp: 13    Intake/Output Summary (Last 24 hours) at 07/22/13 0738 Last data filed at 07/22/13 0600  Gross per 24 hour  Intake 1087.6 ml  Output    700 ml  Net  387.6 ml   Filed Weights   07/22/13 0010  Weight: 86.2 kg (190 lb 0.6 oz)    Exam:   General:  Awake, answering questions, following commands.   Cardiovascular: S 1, S 2 RRR  Respiratory: CTA  Abdomen: BS present, soft, mild epigastric tre  Musculoskeletal: no distress.   Data Reviewed: Basic Metabolic Panel:  Recent Labs Lab 07/21/13 1740 07/22/13 0120 07/22/13 0320 07/22/13 0515  NA 132* 134* 138 137  K 5.0 3.8 3.8 4.9  CL 98 109 111 111  CO2 13* 17* 18* 15*  GLUCOSE 359* 166* 91 91   BUN 19 17 15 14   CREATININE 1.13 0.95 0.93 0.92  CALCIUM 10.0 9.7 9.7 9.6  MG  --   --  2.1  --    Liver Function Tests:  Recent Labs Lab 07/21/13 1740 07/22/13 0320  AST 14 8  ALT 9 6  ALKPHOS 111 88  BILITOT 0.5 0.4  PROT 7.2 5.8*  ALBUMIN 3.9 3.2*    Recent Labs Lab 07/21/13 1740  LIPASE 14   No results found for this basename: AMMONIA,  in the last 168 hours CBC:  Recent Labs Lab 07/21/13 1740 07/22/13 0120 07/22/13 0320  WBC 8.3 7.6 7.9  NEUTROABS 6.5  --  4.2  HGB 15.3 13.3 13.6  HCT 43.8 37.9* 39.0  MCV 85.7 85.0 85.0  PLT 273 243 249   Cardiac Enzymes:  Recent Labs Lab 07/21/13 1740 07/22/13 0120  TROPONINI <0.30 <0.30   BNP (last 3 results) No results found for this basename: PROBNP,  in the last 8760 hours CBG:  Recent Labs Lab 07/21/13 1640 07/21/13 2044 07/21/13 2206 07/22/13 0407  GLUCAP 324* 287* 298* 88    Recent Results (from the past 240 hour(s))  MRSA PCR SCREENING     Status: None   Collection Time    07/22/13 12:23 AM      Result Value Range Status   MRSA by PCR NEGATIVE  NEGATIVE Final   Comment:  The GeneXpert MRSA Assay (FDA     approved for NASAL specimens     only), is one component of a     comprehensive MRSA colonization     surveillance program. It is not     intended to diagnose MRSA     infection nor to guide or     monitor treatment for     MRSA infections.     Studies: US Abdomen Complete  07/21/2013   *RADIOLOGY REPORT*  Clinical Data:  Right upper quadrant abdominal pain.  COMPLETE ABDOMINAL ULTRASOUND  Comparison:  CT abdomen and pelvis 05/08/2012.  10/24/2010.  Findings:  Gallbladder:  Contracted with wall thickening measuring up to 4 mm. No visible shadowing gallstones or echogenic sludge.  No pericholecystic fluid.  Negative sonographic Murphy's sign according to the ultrasound technologist.  Common bile duct:  Normal in caliber with maximum diameter approximating 7 mm.  Liver:  No focal  mass lesion seen.  Within normal limits in parenchymal echogenicity.  IVC:  Patent.  Pancreas:  Although the pancreas is difficult to visualize in its entirety, no focal pancreatic abnormality is identified.  The head was obscured by overlying bowel gas.  Spleen:  Normal size and echotexture without focal parenchymal abnormality.  Right Kidney:  No hydronephrosis.  Well-preserved cortex.  No shadowing calculi.  Normal size and parenchymal echotexture without focal abnormalities.  Approximately 9.2 cm in length.  Left Kidney:  No hydronephrosis.  Well-preserved cortex.  No shadowing calculi.  Normal size and parenchymal echotexture without focal abnormalities.  Approximately 11.1 cm in length.  Abdominal aorta:  Normal in caliber throughout its visualized course in the abdomen without significant atherosclerosis.  IMPRESSION:  1.  Markedly contracted gallbladder accounting for the mild wall thickening.  This can be a manifestation of chronic cholecystitis or may just be related to a recent meal.  Absent sonographic Murphy's sign suggests against acute cholecystitis. 2.  Otherwise normal examination with a caveat that the pancreatic head was obscured by overlying bowel gas was therefore not evaluated.   Original Report Authenticated By: Hulan Saas, M.D.   [facility] 1 View  07/21/2013   *RADIOLOGY REPORT*  Clinical Data: Chest pain.  High blood sugar.  PORTABLE CHEST - 1 VIEW  Comparison: 10/01/2012  Findings: The heart size and pulmonary vascularity are normal. The lungs appear clear and expanded without focal air space disease or consolidation. No blunting of the costophrenic angles.  No pneumothorax.  Mediastinal contours appear intact.  No significant changes since previous study.  IMPRESSION: No evidence of active pulmonary disease.   Original Report Authenticated By: Burman Nieves, M.D.    Scheduled Meds: . enoxaparin (LOVENOX) injection  40 mg Subcutaneous Q24H  . pantoprazole (PROTONIX) IV   40 mg Intravenous Q12H   Continuous Infusions: . sodium chloride    . dextrose 5 % and 0.45% NaCl 125 mL/hr at 07/22/13 0400  . insulin (NOVOLIN-R) infusion 0.3 Units/hr (07/22/13 0400)    Principal Problem:   DKA (diabetic ketoacidoses) Active Problems:   DM (diabetes mellitus), type 2, uncontrolled   Chronic pain in left shoulder   Nausea & vomiting   Vomiting   Left shoulder pain   Abdominal pain    Time spent: 35 minutes.     Novella Abraha  [facility] Pager [phone]. If 7PM-7AM, please contact night-coverage at [email or web address], password [facility] 07/22/2013, 7:38 AM  LOS: 1 day

## 2013-07-23 ENCOUNTER — Inpatient Hospital Stay (HOSPITAL_COMMUNITY): Payer: No Typology Code available for payment source

## 2013-07-23 LAB — CBC
MCV: 85.8 fL (ref 78.0–100.0)
Platelets: 224 10*3/uL (ref 150–400)
RDW: 13.1 % (ref 11.5–15.5)
WBC: 4.8 10*3/uL (ref 4.0–10.5)

## 2013-07-23 LAB — BASIC METABOLIC PANEL
Calcium: 8.5 mg/dL (ref 8.4–10.5)
Chloride: 112 mEq/L (ref 96–112)
Creatinine, Ser: 0.93 mg/dL (ref 0.50–1.35)
GFR calc Af Amer: >90 mL/min (ref >90)

## 2013-07-23 LAB — GLUCOSE, CAPILLARY
Glucose-Capillary: 362 mg/dL — ABNORMAL HIGH (ref 70–99)
Glucose-Capillary: 82 mg/dL (ref 70–99)

## 2013-07-23 MED ORDER — TECHNETIUM TC 99M MEBROFENIN IV KIT
5.0000 | PACK | Freq: Once | INTRAVENOUS | Status: AC | PRN
  Administered 2013-07-23: 5.5 via INTRAVENOUS

## 2013-07-23 MED ORDER — INSULIN GLARGINE 100 UNIT/ML ~~LOC~~ SOLN
15.0000 [IU] | Freq: Two times a day (BID) | SUBCUTANEOUS | Status: DC
  Administered 2013-07-23 – 2013-07-24 (×3): 15 [IU] via SUBCUTANEOUS
  Filled 2013-07-23 (×5): qty 0.15

## 2013-07-23 NOTE — Progress Notes (Signed)
INITIAL NUTRITION ASSESSMENT  DOCUMENTATION CODES Per approved criteria  -Not Applicable   INTERVENTION: 1. Patient encouraged to have adequate intake of meals.  2. RD will follow per nutrition plan of care  NUTRITION DIAGNOSIS: Predicted suboptimal energy intake related to inconsistent appetite as evidenced by patient report.   Goal: Patient will meet >/=90% of estimated nutrition needs  Monitor:  PO intake, weight, labs  Reason for Assessment: Malnutrition screening  57 y.o. male  Admitting Dx: DKA (diabetic ketoacidoses)  ASSESSMENT: Patient with history of diabetes admitted with DKA. He reports that he typically "eats like a bird" with small portions of food frequently throughout the day. His diet was just advanced today. He ate about 75% of his lunch. Weight history shows a 5% weight loss over about 3 months, which is not significant for malnutrition. He reports that he tries to follow a diabetic diet, eating healthy foods. He has seen an RD several times in the past for education and declines further intervention.   Height: Ht Readings from Last 1 Encounters:  07/22/13 5\' 9"  (1.753 m)    Weight: Wt Readings from Last 1 Encounters:  07/22/13 190 lb 0.6 oz (86.2 kg)    Ideal Body Weight: 160 pounds  % Ideal Body Weight: 119%  Wt Readings from Last 10 Encounters:  07/22/13 190 lb 0.6 oz (86.2 kg)  05/08/13 205 lb (92.987 kg)  04/27/13 203 lb 8 oz (92.307 kg)  04/06/13 201 lb 11.2 oz (91.491 kg)  10/04/12 205 lb 0.4 oz (93 kg)  04/08/12 200 lb (90.719 kg)  02/29/12 200 lb 9.9 oz (91 kg)  01/12/12 181 lb (82.101 kg)  12/24/11 195 lb 1.7 oz (88.5 kg)  11/23/11 194 lb (87.998 kg)    Usual Body Weight: 200 pounds  % Usual Body Weight: 95%  BMI:  Body mass index is 28.05 kg/(m^2). Patient is overweight.   Estimated Nutritional Needs: Kcal: 2050-2200 kcal Protein: 95-110 g Fluid: >2.6 L/day  Skin: Intact  Diet Order: Carb Control  EDUCATION  NEEDS: -No education needs identified at this time   Intake/Output Summary (Last 24 hours) at 07/23/13 1451 Last data filed at 07/23/13 1100  Gross per 24 hour  Intake 2568.1 ml  Output   2075 ml  Net  493.1 ml    Last BM: PTA   Labs:   Recent Labs Lab 07/22/13 0320  07/22/13 1341 07/22/13 1541 07/23/13 0805  NA 138  < > 133* 136 138  K 3.8  < > 3.7 3.4* 3.8  CL 111  < > 108 110 112  CO2 18*  < > 17* 20 18*  BUN 15  < > 10 9 5*  CREATININE 0.93  < > 0.88 0.93 0.93  CALCIUM 9.7  < > 8.8 8.8 8.5  MG 2.1  --   --   --   --   GLUCOSE 91  < > 193* 133* 84  < > = values in this interval not displayed.  CBG (last 3)   Recent Labs  07/22/13 1707 07/22/13 2146 07/23/13 0802  GLUCAP 102* 241* 82    Scheduled Meds: . enoxaparin (LOVENOX) injection  40 mg Subcutaneous Q24H  . insulin aspart  0-9 Units Subcutaneous TID WC  . insulin glargine  15 Units Subcutaneous BID  . pantoprazole (PROTONIX) IV  40 mg Intravenous Q12H    Continuous Infusions: . sodium chloride 100 mL/hr at 07/23/13 1434    Past Medical History  Diagnosis Date  . Diabetes  mellitus   . Hypercholesterolemia   . GERD (gastroesophageal reflux disease)   . Hypertension   . Hypercholesteremia     hx of  . Hemorrhoids     hx of  . Peripheral neuropathy   . Depression     Past Surgical History  Procedure Laterality Date  . Tonsillectomy      Linnell Fulling, RD, LDN Pager #: [phone] After-Hours Pager #: [phone]

## 2013-07-23 NOTE — Progress Notes (Signed)
[facility] PROGRESS NOTE  [name] [name] ZOX:[id number] DOB: 12/13/1955 DOA: 07/21/2013 PCP: Eoghan Fenton, MD  Assessment/Plan: 1-Metabolic acidosis/DKA: gap close. He was transition to Lantus. B-met for this morning pending.   2-Diabetes/ DKA: Will change lantus to home dose after resume diet.   3-Abdominal pain, nausea and vomiting: this could be secondary to DKA. [place] with questions of chronic cholecystitis. HIDA scan ordered. Continue with Zosyn until HIDA scan results available.. Continue with protonix. Pain has resolved.   4-Atypical chest pain: denies chest pain. Troponin time 2 negative.   Code Status: Full Code.  Family Communication: Care discussed with patient.  Disposition Plan: Depending on labs results will transfer to telemetry.    Consultants:  none  Procedures:  HIDA scan Pending.   Antibiotics:  Zosyn 8-23  HPI/Subjective: Feeling well. No abdominal pain.   Objective: Filed Vitals:   07/23/13 0400  BP: 109/65  Pulse: 73  Temp:   Resp: 14    Intake/Output Summary (Last 24 hours) at 07/23/13 0737 Last data filed at 07/23/13 0600  Gross per 24 hour  Intake 3019.8 ml  Output   2075 ml  Net  944.8 ml   Filed Weights   07/22/13 0010  Weight: 86.2 kg (190 lb 0.6 oz)    Exam:   General:  Awake, answering questions, following commands.   Cardiovascular: S 1, S 2 RRR  Respiratory: CTA  Abdomen: BS present, soft, mild epigastric tre  Musculoskeletal: no distress.   Data Reviewed: Basic Metabolic Panel:  Recent Labs Lab 07/22/13 0320  07/22/13 0721 07/22/13 0929 07/22/13 1126 07/22/13 1341 07/22/13 1541  NA 138  < > 134* 134* 133* 133* 136  K 3.8  < > 4.1 4.4 4.4 3.7 3.4*  CL 111  < > 108 109 109 108 110  CO2 18*  < > 15* 17* 15* 17* 20  GLUCOSE 91  < > 132* 181* 229* 193* 133*  BUN 15  < > 13 12 12 10 9   CREATININE 0.93  < > 0.91 0.94 0.88 0.88 0.93  CALCIUM 9.7  < > 9.1 8.8 8.6 8.8 8.8  MG 2.1  --   --   --   --   --    --   < > = values in this interval not displayed. Liver Function Tests:  Recent Labs Lab 07/21/13 1740 07/22/13 0320  AST 14 8  ALT 9 6  ALKPHOS 111 88  BILITOT 0.5 0.4  PROT 7.2 5.8*  ALBUMIN 3.9 3.2*    Recent Labs Lab 07/21/13 1740  LIPASE 14   No results found for this basename: AMMONIA,  in the last 168 hours CBC:  Recent Labs Lab 07/21/13 1740 07/22/13 0120 07/22/13 0320  WBC 8.3 7.6 7.9  NEUTROABS 6.5  --  4.2  HGB 15.3 13.3 13.6  HCT 43.8 37.9* 39.0  MCV 85.7 85.0 85.0  PLT 273 243 249   Cardiac Enzymes:  Recent Labs Lab 07/21/13 1740 07/22/13 0120 07/22/13 0721 07/22/13 1341  TROPONINI <0.30 <0.30 <0.30 <0.30   BNP (last 3 results) No results found for this basename: PROBNP,  in the last 8760 hours CBG:  Recent Labs Lab 07/22/13 1407 07/22/13 1510 07/22/13 1603 07/22/13 1707 07/22/13 2146  GLUCAP 151* 140* 100* 102* 241*    Recent Results (from the past 240 hour(s))  MRSA PCR SCREENING     Status: None   Collection Time    07/22/13 12:23 AM      Result Value  Range Status   MRSA by PCR NEGATIVE  NEGATIVE Final   Comment:            The GeneXpert MRSA Assay (FDA     approved for NASAL specimens     only), is one component of a     comprehensive MRSA colonization     surveillance program. It is not     intended to diagnose MRSA     infection nor to guide or     monitor treatment for     MRSA infections.     Studies: US Abdomen Complete  07/21/2013   *RADIOLOGY REPORT*  Clinical Data:  Right upper quadrant abdominal pain.  COMPLETE ABDOMINAL ULTRASOUND  Comparison:  CT abdomen and pelvis 05/08/2012.  10/24/2010.  Findings:  Gallbladder:  Contracted with wall thickening measuring up to 4 mm. No visible shadowing gallstones or echogenic sludge.  No pericholecystic fluid.  Negative sonographic Murphy's sign according to the ultrasound technologist.  Common bile duct:  Normal in caliber with maximum diameter approximating 7 mm.  Liver:   No focal mass lesion seen.  Within normal limits in parenchymal echogenicity.  IVC:  Patent.  Pancreas:  Although the pancreas is difficult to visualize in its entirety, no focal pancreatic abnormality is identified.  The head was obscured by overlying bowel gas.  Spleen:  Normal size and echotexture without focal parenchymal abnormality.  Right Kidney:  No hydronephrosis.  Well-preserved cortex.  No shadowing calculi.  Normal size and parenchymal echotexture without focal abnormalities.  Approximately 9.2 cm in length.  Left Kidney:  No hydronephrosis.  Well-preserved cortex.  No shadowing calculi.  Normal size and parenchymal echotexture without focal abnormalities.  Approximately 11.1 cm in length.  Abdominal aorta:  Normal in caliber throughout its visualized course in the abdomen without significant atherosclerosis.  IMPRESSION:  1.  Markedly contracted gallbladder accounting for the mild wall thickening.  This can be a manifestation of chronic cholecystitis or may just be related to a recent meal.  Absent sonographic Murphy's sign suggests against acute cholecystitis. 2.  Otherwise normal examination with a caveat that the pancreatic head was obscured by overlying bowel gas was therefore not evaluated.   Original Report Authenticated By: Hulan Saas, M.D.   [facility] 1 View  07/21/2013   *RADIOLOGY REPORT*  Clinical Data: Chest pain.  High blood sugar.  PORTABLE CHEST - 1 VIEW  Comparison: 10/01/2012  Findings: The heart size and pulmonary vascularity are normal. The lungs appear clear and expanded without focal air space disease or consolidation. No blunting of the costophrenic angles.  No pneumothorax.  Mediastinal contours appear intact.  No significant changes since previous study.  IMPRESSION: No evidence of active pulmonary disease.   Original Report Authenticated By: Burman Nieves, M.D.    Scheduled Meds: . enoxaparin (LOVENOX) injection  40 mg Subcutaneous Q24H  . insulin aspart  0-9  Units Subcutaneous TID WC  . insulin glargine  20 Units Subcutaneous Q24H  . pantoprazole (PROTONIX) IV  40 mg Intravenous Q12H  . piperacillin-tazobactam (ZOSYN)  IV  3.375 g Intravenous Q8H  . potassium chloride  40 mEq Oral BID WC   Continuous Infusions: . sodium chloride 125 mL/hr (07/22/13 1821)  . insulin (NOVOLIN-R) infusion 0.8 Units/hr (07/22/13 1709)    Principal Problem:   DKA (diabetic ketoacidoses) Active Problems:   DM (diabetes mellitus), type 2, uncontrolled   Chronic pain in left shoulder   Nausea & vomiting   Vomiting   Left shoulder  pain   Abdominal pain    Time spent: 35 minutes.     REGALADO,BELKYS  [facility] Pager [phone]. If 7PM-7AM, please contact night-coverage at [email or web address], password [facility] 07/23/2013, 7:37 AM  LOS: 2 days

## 2013-07-23 NOTE — Progress Notes (Signed)
PA on call notified about bedtime CBG of 242 with no bedtime coverage ordered.   The night before pt's CBG was 242 and received 20 units of lantus and a one time order of 2 units Novolog, pt's CBG in am was 81. Lantus was decreased to 15 units today. PA on call ordered no bedtime coverage for the 242 blood sugar tonight.  Will monitor.  Channon Brougher, [place] Edwards RN

## 2013-07-24 LAB — CBC
HCT: 35.9 % — ABNORMAL LOW (ref 39.0–52.0)
Hemoglobin: 12.5 g/dL — ABNORMAL LOW (ref 13.0–17.0)
MCV: 85.3 fL (ref 78.0–100.0)
RBC: 4.21 MIL/uL — ABNORMAL LOW (ref 4.22–5.81)
WBC: 4.1 10*3/uL (ref 4.0–10.5)

## 2013-07-24 LAB — BASIC METABOLIC PANEL
BUN: 8 mg/dL (ref 6–23)
CO2: 20 mEq/L (ref 19–32)
Chloride: 110 mEq/L (ref 96–112)
Creatinine, Ser: 0.81 mg/dL (ref 0.50–1.35)
Glucose, Bld: 190 mg/dL — ABNORMAL HIGH (ref 70–99)

## 2013-07-24 LAB — GLUCOSE, CAPILLARY: Glucose-Capillary: 158 mg/dL — ABNORMAL HIGH (ref 70–99)

## 2013-07-24 MED ORDER — HYDROCODONE-ACETAMINOPHEN 5-325 MG PO TABS: 1.0000 | ORAL_TABLET | Freq: Four times a day (QID) | ORAL | Status: DC | PRN

## 2013-07-24 MED ORDER — POTASSIUM CHLORIDE CRYS ER 20 MEQ PO TBCR
40.0000 meq | EXTENDED_RELEASE_TABLET | Freq: Once | ORAL | Status: AC
  Administered 2013-07-24: 40 meq via ORAL
  Filled 2013-07-24: qty 2

## 2013-07-24 MED ORDER — METOCLOPRAMIDE HCL 5 MG/5ML PO SOLN: 10.0000 mg | Freq: Three times a day (TID) | ORAL | Status: DC

## 2013-07-24 MED ORDER — PANTOPRAZOLE SODIUM 40 MG PO TBEC: 40.0000 mg | DELAYED_RELEASE_TABLET | Freq: Every day | ORAL | Status: DC

## 2013-07-24 MED ORDER — INSULIN NPH ISOPHANE & REGULAR (70-30) 100 UNIT/ML ~~LOC~~ SUSP: 30.0000 [IU] | Freq: Two times a day (BID) | SUBCUTANEOUS | Status: DC

## 2013-07-24 MED ORDER — METOCLOPRAMIDE HCL 5 MG/5ML PO SOLN
10.0000 mg | Freq: Three times a day (TID) | ORAL | Status: DC
  Administered 2013-07-24: 10 mg via ORAL
  Filled 2013-07-24 (×4): qty 10

## 2013-07-24 NOTE — Discharge Summary (Signed)
Physician Discharge Summary  [name] [name] UJW:[id number] DOB: Dec 14, 1955 DOA: 07/21/2013  PCP: Linley Fenton, MD  Admit date: 07/21/2013 Discharge date: 07/24/2013  Time spent: 35 minutes  Recommendations for Outpatient Follow-up:  1. Needs to follow up with PCP for further adjustment of insulin regimen.  2. Needs to follow up with GI for chronic epigastric pain. Might need repeat abdominal US.   Discharge Diagnoses:    DKA (diabetic ketoacidoses)   DM (diabetes mellitus), type 2, uncontrolled   Gastroparesis.   Chronic pain in left shoulder   Abdominal pain   Discharge Condition: Improved.   Diet recommendation: Carb modified diet.   Filed Weights   07/22/13 0010  Weight: 86.2 kg (190 lb 0.6 oz)    History of present illness:  [name] [name] is a 57 y.o. male with known history of diabetes mellitus presented to the ER because of patient not feeling well. In addition patient has been having some nausea vomiting abdominal discomfort. Patient has been having these symptoms for last 2-3 days. Patient said that he was unable to keep because of nausea vomiting with burning sensation in the abdomen particularly in the epigastric area. Denies any diarrhea fever chills. Denies any shortness of breath with has been having some burning sensation in his chest. In the ER patient was found to have elevated blood sugar with anion gap metabolic acidosis. Patient has been limited for DKA. Patient states he's been compliant with his diabetic medication. His wife states that his blood sugar has been running around 150-300 every day. In the ER due to his nausea vomiting sonogram was done which also shows a hospital of chronic cholecystitis.   Hospital Course:  Patient was admitted with DKA, blood sugar at 359, bicarbonate 13, gap 21. He was started on IV insulin, IV fluids. Lactic acid was normal at 0.8. Patient was subsequently transitioned to Lantus and sliding-scale insulin after anion gap closed and  bicarbonate increase to more than 19. Patient was complaining of nausea vomiting and abdominal pain this was thought to be secondary to DKA. This has resolved.  he has been tolerated regular diet. He had an abdominal ultrasound that showed gallbladder thickening versus contraction questionable of chronic cholecystitis. HIDA scan  was negative. Patient will need to followup with primary care for fever care. Patient has history of chronic abdominal pain, his primary care doctor gave him Reglan which helped with this. I will restart Reglan today. He would need to followup with his primary care doctor for future evaluation. I think that he probably had some diabetic gastroparesis.   1-Metabolic acidosis/DKA: gap close. He was transition to Lantus.  2-Diabetes/ DKA: Rest in home dose 70/30 at time of discharge. He will follow with his primary care doctor to care a prescription for Lantus. He will apply for a program that will give him Lantus.  3-Abdominal pain, nausea and vomiting: this could be secondary to DKA. [place] with questions of chronic cholecystitis. HIDA scan normal. Zosyn discontinue. .. Continue with protonix. Pain has resolved. No RUQ pain.  4-Atypical chest pain: denies chest pain. Troponin time 2 negative.  5-Gastroparesis; relates indigestion. Have try Reglan in the pas which help. He has had this problem for more than 1 year.   Procedures: Abdominal US: Markedly contracted gallbladder accounting for the mild wall  thickening. This can be a manifestation of chronic cholecystitis  or may just be related to a recent meal. Absent sonographic  Murphy's sign suggests against acute cholecystitis.  2. Otherwise normal examination  with a caveat that the pancreatic  head was obscured by overlying bowel gas was therefore not  evaluated. HIDA Scan: Normal examination. No evidence of common bile duct or cystic duct  obstruction.   Consultations:  none  Discharge Exam: Filed Vitals:   07/24/13  0442  BP: 125/79  Pulse: 69  Temp: 97.8 F (36.6 C)  Resp: 18    General: No distress.  Cardiovascular: S 1, S 2 RRR Respiratory: CTA Abdomen: soft, NT, ND  Discharge Instructions  Discharge Orders   Future Orders Complete By Expires   Diet Carb Modified  As directed    Increase activity slowly  As directed        Medication List    STOP taking these medications       insulin aspart 100 UNIT/ML injection  Commonly known as:  novoLOG     Insulin Glargine 100 UNIT/ML Sopn  Commonly known as:  LANTUS SOLOSTAR      TAKE these medications       aspirin EC 81 MG tablet  Take 1 tablet (81 mg total) by mouth daily.     buPROPion 150 MG 12 hr tablet  Commonly known as:  WELLBUTRIN SR  Take 1 tablet (150 mg total) by mouth daily.     glucose blood test strip  Use as instructed     HYDROcodone-acetaminophen 5-325 MG per tablet  Commonly known as:  NORCO/VICODIN  Take 1 tablet by mouth every 6 (six) hours as needed. pain     insulin NPH-regular (70-30) 100 UNIT/ML injection  Commonly known as:  NOVOLIN 70/30  Inject 30 Units into the skin 2 (two) times daily with a meal.     metFORMIN 500 MG tablet  Commonly known as:  GLUCOPHAGE  Take 1 tablet (500 mg total) by mouth 2 (two) times daily with a meal.     metoCLOPramide 5 MG/5ML solution  Commonly known as:  REGLAN  Take 10 mLs (10 mg total) by mouth 4 (four) times daily -  before meals and at bedtime.     pantoprazole 40 MG tablet  Commonly known as:  PROTONIX  Take 1 tablet (40 mg total) by mouth daily.     zolpidem 10 MG tablet  Commonly known as:  AMBIEN  Take 1 tablet (10 mg total) by mouth at bedtime as needed for sleep. Sleep.       No Known Allergies     Follow-up Information   Follow up with Jeret Fenton, MD In 1 week.   Specialty:  Family Medicine   Contact information:   [address] [place] [address] [phone]        The results of significant diagnostics from  this hospitalization (including imaging, microbiology, ancillary and laboratory) are listed below for reference.    Significant Diagnostic Studies: Nm Hepatobiliary Liver Func  07/23/2013   *RADIOLOGY REPORT*  Clinical Data:  Contracted gallbladder on recent ultrasound, evaluate for acute cholecystitis.  NUCLEAR MEDICINE HEPATOBILIARY IMAGING  Technique:  Sequential images of the abdomen were obtained out to 60 minutes following intravenous administration of radiopharmaceutical.  Radiopharmaceutical:  5.75mCi Tc-97m Choletec  Comparison:  No prior nuclear imaging.  Abdominal ultrasound 07/21/2013.  Findings: Hepatic uptake of the radiopharmaceutical is normal. Intra and extrahepatic bile ducts are visible within 10 minutes. Duodenal activity is identified within15 minutes.  Gallbladder activity is visualized within 10 minutes, with normal filling of the gallbladder throughout the remainder of the imaging sequence. No evidence of tracer  retention by the liver.  IMPRESSION: Normal examination.  No evidence of common bile duct or cystic duct obstruction.   Original Report Authenticated By: Hulan Saas, M.D.   US Abdomen Complete  07/21/2013   *RADIOLOGY REPORT*  Clinical Data:  Right upper quadrant abdominal pain.  COMPLETE ABDOMINAL ULTRASOUND  Comparison:  CT abdomen and pelvis 05/08/2012.  10/24/2010.  Findings:  Gallbladder:  Contracted with wall thickening measuring up to 4 mm. No visible shadowing gallstones or echogenic sludge.  No pericholecystic fluid.  Negative sonographic Murphy's sign according to the ultrasound technologist.  Common bile duct:  Normal in caliber with maximum diameter approximating 7 mm.  Liver:  No focal mass lesion seen.  Within normal limits in parenchymal echogenicity.  IVC:  Patent.  Pancreas:  Although the pancreas is difficult to visualize in its entirety, no focal pancreatic abnormality is identified.  The head was obscured by overlying bowel gas.  Spleen:  Normal size and  echotexture without focal parenchymal abnormality.  Right Kidney:  No hydronephrosis.  Well-preserved cortex.  No shadowing calculi.  Normal size and parenchymal echotexture without focal abnormalities.  Approximately 9.2 cm in length.  Left Kidney:  No hydronephrosis.  Well-preserved cortex.  No shadowing calculi.  Normal size and parenchymal echotexture without focal abnormalities.  Approximately 11.1 cm in length.  Abdominal aorta:  Normal in caliber throughout its visualized course in the abdomen without significant atherosclerosis.  IMPRESSION:  1.  Markedly contracted gallbladder accounting for the mild wall thickening.  This can be a manifestation of chronic cholecystitis or may just be related to a recent meal.  Absent sonographic Murphy's sign suggests against acute cholecystitis. 2.  Otherwise normal examination with a caveat that the pancreatic head was obscured by overlying bowel gas was therefore not evaluated.   Original Report Authenticated By: Hulan Saas, M.D.   [facility] 1 View  07/21/2013   *RADIOLOGY REPORT*  Clinical Data: Chest pain.  High blood sugar.  PORTABLE CHEST - 1 VIEW  Comparison: 10/01/2012  Findings: The heart size and pulmonary vascularity are normal. The lungs appear clear and expanded without focal air space disease or consolidation. No blunting of the costophrenic angles.  No pneumothorax.  Mediastinal contours appear intact.  No significant changes since previous study.  IMPRESSION: No evidence of active pulmonary disease.   Original Report Authenticated By: Burman Nieves, M.D.    Microbiology: Recent Results (from the past 240 hour(s))  MRSA PCR SCREENING     Status: None   Collection Time    07/22/13 12:23 AM      Result Value Range Status   MRSA by PCR NEGATIVE  NEGATIVE Final   Comment:            The GeneXpert MRSA Assay (FDA     approved for NASAL specimens     only), is one component of a     comprehensive MRSA colonization     surveillance  program. It is not     intended to diagnose MRSA     infection nor to guide or     monitor treatment for     MRSA infections.     Labs: Basic Metabolic Panel:  Recent Labs Lab 07/22/13 0320  07/22/13 1126 07/22/13 1341 07/22/13 1541 07/23/13 0805 07/24/13 0508  NA 138  < > 133* 133* 136 138 136  K 3.8  < > 4.4 3.7 3.4* 3.8 3.4*  CL 111  < > 109 108 110 112 110  CO2  18*  < > 15* 17* 20 18* 20  GLUCOSE 91  < > 229* 193* 133* 84 190*  BUN 15  < > 12 10 9  5* 8  CREATININE 0.93  < > 0.88 0.88 0.93 0.93 0.81  CALCIUM 9.7  < > 8.6 8.8 8.8 8.5 8.2*  MG 2.1  --   --   --   --   --   --   < > = values in this interval not displayed. Liver Function Tests:  Recent Labs Lab 07/21/13 1740 07/22/13 0320  AST 14 8  ALT 9 6  ALKPHOS 111 88  BILITOT 0.5 0.4  PROT 7.2 5.8*  ALBUMIN 3.9 3.2*    Recent Labs Lab 07/21/13 1740  LIPASE 14   No results found for this basename: AMMONIA,  in the last 168 hours CBC:  Recent Labs Lab 07/21/13 1740 07/22/13 0120 07/22/13 0320 07/23/13 0805 07/24/13 0508  WBC 8.3 7.6 7.9 4.8 4.1  NEUTROABS 6.5  --  4.2  --   --   HGB 15.3 13.3 13.6 13.5 12.5*  HCT 43.8 37.9* 39.0 39.3 35.9*  MCV 85.7 85.0 85.0 85.8 85.3  PLT 273 243 249 224 199   Cardiac Enzymes:  Recent Labs Lab 07/21/13 1740 07/22/13 0120 07/22/13 0721 07/22/13 1341  TROPONINI <0.30 <0.30 <0.30 <0.30   BNP: BNP (last 3 results) No results found for this basename: PROBNP,  in the last 8760 hours CBG:  Recent Labs Lab 07/23/13 0802 07/23/13 1203 07/23/13 1711 07/23/13 2151 07/24/13 0729  GLUCAP 82 89 192* 242* 108*       Signed:  REGALADO,BELKYS  [facility] 07/24/2013, 8:59 AM

## 2013-07-24 NOTE — Progress Notes (Signed)
Patient discharge to home, wife at bedside. D/c instructions and follow up appointments done and discussed with patient, verbalized understanding. PIV removed no s/s of infiltration or swelling noted on insertion site.

## 2013-08-17 ENCOUNTER — Other Ambulatory Visit: Payer: Self-pay | Admitting: Family Medicine

## 2013-08-17 NOTE — Telephone Encounter (Signed)
Filled out disability questionaire for patient and placed in bix to be faxed. Answered questions with objective/subjective documentation about his DM2, including hypoglycemic episodes, meds, and DKA episodes. Declined to answer limits of his functional status and recommended that he may have better success with these with a  Disability specialist.   Murtis Sink, MD [facility] Family Medicine Resident, PGY-2 08/17/2013, 8:37 AM

## 2013-09-08 ENCOUNTER — Ambulatory Visit: Payer: Self-pay | Admitting: Family Medicine

## 2013-09-22 ENCOUNTER — Ambulatory Visit: Payer: Self-pay | Admitting: Family Medicine

## 2013-09-28 ENCOUNTER — Ambulatory Visit: Payer: Medicare Other

## 2013-10-05 ENCOUNTER — Other Ambulatory Visit: Payer: Self-pay

## 2014-03-08 ENCOUNTER — Other Ambulatory Visit: Payer: Self-pay

## 2014-04-30 ENCOUNTER — Ambulatory Visit: Payer: Self-pay

## 2014-06-05 ENCOUNTER — Other Ambulatory Visit: Payer: Self-pay | Admitting: Orthopedic Surgery

## 2014-06-05 DIAGNOSIS — M25511 Pain in right shoulder: Secondary | ICD-10-CM

## 2014-06-13 ENCOUNTER — Other Ambulatory Visit: Payer: Self-pay

## 2014-06-14 ENCOUNTER — Emergency Department (HOSPITAL_COMMUNITY): Payer: Medicare Other

## 2014-06-14 ENCOUNTER — Encounter (HOSPITAL_COMMUNITY): Payer: Self-pay | Admitting: Emergency Medicine

## 2014-06-14 ENCOUNTER — Inpatient Hospital Stay (HOSPITAL_COMMUNITY)
Admission: EM | Admit: 2014-06-14 | Discharge: 2014-06-18 | DRG: 247 | Disposition: A | Discharge status: Home / Self Care | Payer: Medicare Other | Attending: Interventional Cardiology | Admitting: Interventional Cardiology

## 2014-06-14 ENCOUNTER — Inpatient Hospital Stay (HOSPITAL_COMMUNITY): Payer: Medicare Other

## 2014-06-14 ENCOUNTER — Encounter (HOSPITAL_COMMUNITY)
Admission: EM | Disposition: A | Discharge status: Home / Self Care | Payer: Self-pay | Source: Home / Self Care | Attending: Interventional Cardiology

## 2014-06-14 DIAGNOSIS — Z833 Family history of diabetes mellitus: Secondary | ICD-10-CM | POA: Diagnosis not present

## 2014-06-14 DIAGNOSIS — I25110 Atherosclerotic heart disease of native coronary artery with unstable angina pectoris: Secondary | ICD-10-CM

## 2014-06-14 DIAGNOSIS — F329 Major depressive disorder, single episode, unspecified: Secondary | ICD-10-CM | POA: Diagnosis present

## 2014-06-14 DIAGNOSIS — E1142 Type 2 diabetes mellitus with diabetic polyneuropathy: Secondary | ICD-10-CM | POA: Diagnosis present

## 2014-06-14 DIAGNOSIS — G8929 Other chronic pain: Secondary | ICD-10-CM | POA: Diagnosis present

## 2014-06-14 DIAGNOSIS — Z955 Presence of coronary angioplasty implant and graft: Secondary | ICD-10-CM

## 2014-06-14 DIAGNOSIS — IMO0002 Reserved for concepts with insufficient information to code with codable children: Secondary | ICD-10-CM | POA: Diagnosis not present

## 2014-06-14 DIAGNOSIS — R739 Hyperglycemia, unspecified: Secondary | ICD-10-CM

## 2014-06-14 DIAGNOSIS — E785 Hyperlipidemia, unspecified: Secondary | ICD-10-CM | POA: Diagnosis present

## 2014-06-14 DIAGNOSIS — Z6379 Other stressful life events affecting family and household: Secondary | ICD-10-CM | POA: Diagnosis not present

## 2014-06-14 DIAGNOSIS — E1165 Type 2 diabetes mellitus with hyperglycemia: Secondary | ICD-10-CM | POA: Diagnosis not present

## 2014-06-14 DIAGNOSIS — Z7902 Long term (current) use of antithrombotics/antiplatelets: Secondary | ICD-10-CM | POA: Diagnosis not present

## 2014-06-14 DIAGNOSIS — I214 Non-ST elevation (NSTEMI) myocardial infarction: Principal | ICD-10-CM

## 2014-06-14 DIAGNOSIS — K2270 Barrett's esophagus without dysplasia: Secondary | ICD-10-CM | POA: Diagnosis present

## 2014-06-14 DIAGNOSIS — E1169 Type 2 diabetes mellitus with other specified complication: Secondary | ICD-10-CM

## 2014-06-14 DIAGNOSIS — F3289 Other specified depressive episodes: Secondary | ICD-10-CM | POA: Diagnosis present

## 2014-06-14 DIAGNOSIS — E1149 Type 2 diabetes mellitus with other diabetic neurological complication: Secondary | ICD-10-CM | POA: Diagnosis present

## 2014-06-14 DIAGNOSIS — Z87891 Personal history of nicotine dependence: Secondary | ICD-10-CM

## 2014-06-14 DIAGNOSIS — Z794 Long term (current) use of insulin: Secondary | ICD-10-CM

## 2014-06-14 DIAGNOSIS — IMO0001 Reserved for inherently not codable concepts without codable children: Secondary | ICD-10-CM

## 2014-06-14 DIAGNOSIS — Z7982 Long term (current) use of aspirin: Secondary | ICD-10-CM

## 2014-06-14 DIAGNOSIS — I2510 Atherosclerotic heart disease of native coronary artery without angina pectoris: Secondary | ICD-10-CM | POA: Diagnosis present

## 2014-06-14 DIAGNOSIS — Z820 Family history of epilepsy and other diseases of the nervous system: Secondary | ICD-10-CM | POA: Diagnosis not present

## 2014-06-14 DIAGNOSIS — I10 Essential (primary) hypertension: Secondary | ICD-10-CM | POA: Diagnosis present

## 2014-06-14 DIAGNOSIS — E7800 Pure hypercholesterolemia, unspecified: Secondary | ICD-10-CM | POA: Diagnosis present

## 2014-06-14 DIAGNOSIS — K219 Gastro-esophageal reflux disease without esophagitis: Secondary | ICD-10-CM | POA: Diagnosis present

## 2014-06-14 DIAGNOSIS — Z8249 Family history of ischemic heart disease and other diseases of the circulatory system: Secondary | ICD-10-CM | POA: Diagnosis not present

## 2014-06-14 DIAGNOSIS — M25519 Pain in unspecified shoulder: Secondary | ICD-10-CM | POA: Diagnosis present

## 2014-06-14 DIAGNOSIS — Z79899 Other long term (current) drug therapy: Secondary | ICD-10-CM | POA: Diagnosis not present

## 2014-06-14 DIAGNOSIS — I2119 ST elevation (STEMI) myocardial infarction involving other coronary artery of inferior wall: Secondary | ICD-10-CM

## 2014-06-14 DIAGNOSIS — R079 Chest pain, unspecified: Secondary | ICD-10-CM | POA: Diagnosis not present

## 2014-06-14 DIAGNOSIS — I2582 Chronic total occlusion of coronary artery: Secondary | ICD-10-CM | POA: Diagnosis present

## 2014-06-14 DIAGNOSIS — I82409 Acute embolism and thrombosis of unspecified deep veins of unspecified lower extremity: Secondary | ICD-10-CM | POA: Diagnosis not present

## 2014-06-14 HISTORY — DX: Personal history of nicotine dependence: Z87.891

## 2014-06-14 HISTORY — PX: LEFT HEART CATHETERIZATION WITH CORONARY ANGIOGRAM: SHX5451

## 2014-06-14 HISTORY — DX: Reserved for concepts with insufficient information to code with codable children: IMO0002

## 2014-06-14 HISTORY — DX: Atherosclerotic heart disease of native coronary artery without angina pectoris: I25.10

## 2014-06-14 HISTORY — DX: Type 2 diabetes mellitus with hyperglycemia: E11.65

## 2014-06-14 HISTORY — DX: Barrett's esophagus without dysplasia: K22.70

## 2014-06-14 LAB — URINALYSIS, ROUTINE W REFLEX MICROSCOPIC
BILIRUBIN URINE: NEGATIVE
Glucose, UA: >1000 mg/dL — AB
Hgb urine dipstick: NEGATIVE
KETONES UR: 15 mg/dL — AB
LEUKOCYTES UA: NEGATIVE
NITRITE: NEGATIVE
Protein, ur: NEGATIVE mg/dL
SPECIFIC GRAVITY, URINE: 1.03 (ref 1.005–1.030)
UROBILINOGEN UA: 0.2 mg/dL (ref 0.0–1.0)
pH: 5 (ref 5.0–8.0)

## 2014-06-14 LAB — COMPREHENSIVE METABOLIC PANEL
ALBUMIN: 3.5 g/dL (ref 3.5–5.2)
ALK PHOS: 110 U/L (ref 39–117)
ALT: 8 U/L (ref 0–53)
AST: 28 U/L (ref 0–37)
Anion gap: 15 (ref 5–15)
BUN: 11 mg/dL (ref 6–23)
CALCIUM: 9.4 mg/dL (ref 8.4–10.5)
CO2: 21 mEq/L (ref 19–32)
Chloride: 99 mEq/L (ref 96–112)
Creatinine, Ser: 0.92 mg/dL (ref 0.50–1.35)
GFR calc non Af Amer: >90 mL/min (ref >90)
Glucose, Bld: 337 mg/dL — ABNORMAL HIGH (ref 70–99)
Potassium: 4.2 mEq/L (ref 3.7–5.3)
SODIUM: 135 meq/L — AB (ref 137–147)
TOTAL PROTEIN: 6.7 g/dL (ref 6.0–8.3)
Total Bilirubin: <0.2 mg/dL — ABNORMAL LOW (ref 0.3–1.2)

## 2014-06-14 LAB — CBC WITH DIFFERENTIAL/PLATELET
BASOS PCT: 0 % (ref 0–1)
Basophils Absolute: 0 10*3/uL (ref 0.0–0.1)
EOS ABS: 0.1 10*3/uL (ref 0.0–0.7)
Eosinophils Relative: 1 % (ref 0–5)
HCT: 42.6 % (ref 39.0–52.0)
Hemoglobin: 14.9 g/dL (ref 13.0–17.0)
Lymphocytes Relative: 27 % (ref 12–46)
Lymphs Abs: 2.1 10*3/uL (ref 0.7–4.0)
MCH: 30.4 pg (ref 26.0–34.0)
MCHC: 35 g/dL (ref 30.0–36.0)
MCV: 86.9 fL (ref 78.0–100.0)
Monocytes Absolute: 0.4 10*3/uL (ref 0.1–1.0)
Monocytes Relative: 6 % (ref 3–12)
NEUTROS PCT: 66 % (ref 43–77)
Neutro Abs: 5.1 10*3/uL (ref 1.7–7.7)
Platelets: 241 10*3/uL (ref 150–400)
RBC: 4.9 MIL/uL (ref 4.22–5.81)
RDW: 12.6 % (ref 11.5–15.5)
WBC: 7.7 10*3/uL (ref 4.0–10.5)

## 2014-06-14 LAB — HEMOGLOBIN A1C
Hgb A1c MFr Bld: 11 % — ABNORMAL HIGH (ref <5.7)
MEAN PLASMA GLUCOSE: 269 mg/dL — AB (ref <117)

## 2014-06-14 LAB — I-STAT TROPONIN, ED: Troponin i, poc: 1.74 ng/mL (ref 0.00–0.08)

## 2014-06-14 LAB — URINE MICROSCOPIC-ADD ON: URINE-OTHER: NONE SEEN

## 2014-06-14 LAB — PROTIME-INR
INR: 1.04 (ref 0.00–1.49)
Prothrombin Time: 13.6 seconds (ref 11.6–15.2)

## 2014-06-14 LAB — TROPONIN I
TROPONIN I: 2.5 ng/mL — AB (ref <0.30)
TROPONIN I: 3.15 ng/mL — AB (ref <0.30)
Troponin I: 2.38 ng/mL (ref <0.30)

## 2014-06-14 LAB — CBG MONITORING, ED: Glucose-Capillary: 274 mg/dL — ABNORMAL HIGH (ref 70–99)

## 2014-06-14 LAB — GLUCOSE, CAPILLARY
GLUCOSE-CAPILLARY: 142 mg/dL — AB (ref 70–99)
Glucose-Capillary: 70 mg/dL (ref 70–99)
Glucose-Capillary: 141 mg/dL — ABNORMAL HIGH (ref 70–99)
Glucose-Capillary: 361 mg/dL — ABNORMAL HIGH (ref 70–99)

## 2014-06-14 LAB — POCT ACTIVATED CLOTTING TIME: Activated Clotting Time: 225 seconds

## 2014-06-14 LAB — MRSA PCR SCREENING: MRSA by PCR: NEGATIVE

## 2014-06-14 LAB — TSH: TSH: 1.89 u[IU]/mL (ref 0.350–4.500)

## 2014-06-14 SURGERY — LEFT HEART CATHETERIZATION WITH CORONARY ANGIOGRAM
Anesthesia: LOCAL

## 2014-06-14 MED ORDER — SODIUM CHLORIDE 0.9 % IV SOLN: INTRAVENOUS | Status: DC

## 2014-06-14 MED ORDER — SODIUM CHLORIDE 0.9 % IV BOLUS (SEPSIS)
1000.0000 mL | Freq: Once | INTRAVENOUS | Status: AC
  Administered 2014-06-14: 1000 mL via INTRAVENOUS

## 2014-06-14 MED ORDER — HEPARIN (PORCINE) IN NACL 2-0.9 UNIT/ML-% IJ SOLN
INTRAMUSCULAR | Status: AC
  Filled 2014-06-14: qty 1500

## 2014-06-14 MED ORDER — HEPARIN SODIUM (PORCINE) 1000 UNIT/ML IJ SOLN
INTRAMUSCULAR | Status: AC
  Filled 2014-06-14: qty 1

## 2014-06-14 MED ORDER — ONDANSETRON HCL 4 MG/2ML IJ SOLN: 4.0000 mg | Freq: Four times a day (QID) | INTRAMUSCULAR | Status: DC | PRN

## 2014-06-14 MED ORDER — MIDAZOLAM HCL 2 MG/2ML IJ SOLN
INTRAMUSCULAR | Status: AC
Start: 2014-06-14 — End: 2014-06-14
  Filled 2014-06-14: qty 2

## 2014-06-14 MED ORDER — PRASUGREL HCL 10 MG PO TABS
ORAL_TABLET | ORAL | Status: AC
  Filled 2014-06-14: qty 1

## 2014-06-14 MED ORDER — ASPIRIN 81 MG PO CHEW: 81.0000 mg | CHEWABLE_TABLET | Freq: Every day | ORAL | Status: DC

## 2014-06-14 MED ORDER — VERAPAMIL HCL 2.5 MG/ML IV SOLN
INTRAVENOUS | Status: AC
  Filled 2014-06-14: qty 2

## 2014-06-14 MED ORDER — LIDOCAINE HCL (PF) 1 % IJ SOLN
INTRAMUSCULAR | Status: AC
  Filled 2014-06-14: qty 30

## 2014-06-14 MED ORDER — PRASUGREL HCL 10 MG PO TABS
10.0000 mg | ORAL_TABLET | Freq: Every day | ORAL | Status: DC
  Administered 2014-06-14 – 2014-06-18 (×5): 10 mg via ORAL
  Filled 2014-06-14 (×5): qty 1

## 2014-06-14 MED ORDER — NITROGLYCERIN IN D5W 200-5 MCG/ML-% IV SOLN
INTRAVENOUS | Status: AC
  Administered 2014-06-14: 10 ug/min via INTRAVENOUS
  Filled 2014-06-14: qty 250

## 2014-06-14 MED ORDER — HEPARIN (PORCINE) IN NACL 100-0.45 UNIT/ML-% IJ SOLN
1000.0000 [IU]/h | INTRAMUSCULAR | Status: DC
  Administered 2014-06-14: 1000 [IU]/h via INTRAVENOUS
  Filled 2014-06-14: qty 250

## 2014-06-14 MED ORDER — NITROGLYCERIN IN D5W 200-5 MCG/ML-% IV SOLN
2.0000 ug/min | INTRAVENOUS | Status: DC
  Administered 2014-06-14: 10 ug/min via INTRAVENOUS

## 2014-06-14 MED ORDER — FENTANYL CITRATE 0.05 MG/ML IJ SOLN
INTRAMUSCULAR | Status: AC
  Filled 2014-06-14: qty 2

## 2014-06-14 MED ORDER — SODIUM CHLORIDE 0.9 % IJ SOLN: 3.0000 mL | INTRAMUSCULAR | Status: DC | PRN

## 2014-06-14 MED ORDER — SODIUM CHLORIDE 0.9 % IJ SOLN: 3.0000 mL | Freq: Two times a day (BID) | INTRAMUSCULAR | Status: DC

## 2014-06-14 MED ORDER — ACETAMINOPHEN 325 MG PO TABS: 650.0000 mg | ORAL_TABLET | ORAL | Status: DC | PRN

## 2014-06-14 MED ORDER — TIROFIBAN HCL IV 5 MG/100ML
INTRAVENOUS | Status: AC
  Filled 2014-06-14: qty 100

## 2014-06-14 MED ORDER — ATORVASTATIN CALCIUM 40 MG PO TABS
40.0000 mg | ORAL_TABLET | Freq: Every day | ORAL | Status: DC
  Administered 2014-06-14 – 2014-06-17 (×4): 40 mg via ORAL
  Filled 2014-06-14 (×5): qty 1

## 2014-06-14 MED ORDER — SODIUM CHLORIDE 0.9 % IV SOLN
INTRAVENOUS | Status: AC
  Administered 2014-06-14: 17:00:00 via INTRAVENOUS

## 2014-06-14 MED ORDER — HEPARIN BOLUS VIA INFUSION
4000.0000 [IU] | INTRAVENOUS | Status: AC
  Administered 2014-06-14: 4000 [IU] via INTRAVENOUS
  Filled 2014-06-14: qty 4000

## 2014-06-14 MED ORDER — SODIUM CHLORIDE 0.9 % IV SOLN: 250.0000 mL | INTRAVENOUS | Status: DC | PRN

## 2014-06-14 MED ORDER — METOPROLOL TARTRATE 12.5 MG HALF TABLET
12.5000 mg | ORAL_TABLET | Freq: Two times a day (BID) | ORAL | Status: DC
  Administered 2014-06-14 – 2014-06-18 (×9): 12.5 mg via ORAL
  Filled 2014-06-14 (×10): qty 1

## 2014-06-14 MED ORDER — TIROFIBAN HCL IV 5 MG/100ML
0.1500 ug/kg/min | INTRAVENOUS | Status: AC
  Administered 2014-06-14 (×2): 0.15 ug/kg/min via INTRAVENOUS
  Filled 2014-06-14: qty 100

## 2014-06-14 MED ORDER — INSULIN ASPART 100 UNIT/ML ~~LOC~~ SOLN
0.0000 [IU] | Freq: Three times a day (TID) | SUBCUTANEOUS | Status: DC
  Administered 2014-06-14: 3 [IU] via SUBCUTANEOUS
  Administered 2014-06-15: 4 [IU] via SUBCUTANEOUS
  Administered 2014-06-15: 20 [IU] via SUBCUTANEOUS
  Administered 2014-06-15: 3 [IU] via SUBCUTANEOUS

## 2014-06-14 MED ORDER — ASPIRIN EC 81 MG PO TBEC
81.0000 mg | DELAYED_RELEASE_TABLET | Freq: Every day | ORAL | Status: DC
Start: 2014-06-15 — End: 2014-06-18
  Administered 2014-06-15 – 2014-06-18 (×3): 81 mg via ORAL
  Filled 2014-06-14 (×4): qty 1

## 2014-06-14 MED ORDER — BUPROPION HCL ER (SR) 150 MG PO TB12
150.0000 mg | ORAL_TABLET | Freq: Two times a day (BID) | ORAL | Status: DC
  Administered 2014-06-15 – 2014-06-17 (×2): 150 mg via ORAL
  Filled 2014-06-14 (×10): qty 1

## 2014-06-14 MED ORDER — NITROGLYCERIN 0.4 MG SL SUBL: 0.4000 mg | SUBLINGUAL_TABLET | SUBLINGUAL | Status: DC | PRN

## 2014-06-14 MED ORDER — ASPIRIN 81 MG PO CHEW: 81.0000 mg | CHEWABLE_TABLET | ORAL | Status: DC

## 2014-06-14 MED ORDER — ONDANSETRON HCL 4 MG/2ML IJ SOLN
4.0000 mg | Freq: Once | INTRAMUSCULAR | Status: AC
  Administered 2014-06-14: 4 mg via INTRAVENOUS
  Filled 2014-06-14: qty 2

## 2014-06-14 MED ORDER — NITROGLYCERIN 1 MG/10 ML FOR IR/CATH LAB
INTRA_ARTERIAL | Status: AC
  Filled 2014-06-14: qty 10

## 2014-06-14 MED ORDER — NITROGLYCERIN 0.4 MG SL SUBL
0.4000 mg | SUBLINGUAL_TABLET | SUBLINGUAL | Status: DC | PRN
  Administered 2014-06-14: 0.4 mg via SUBLINGUAL
  Filled 2014-06-14: qty 1

## 2014-06-14 MED ORDER — ASPIRIN 325 MG PO TABS
325.0000 mg | ORAL_TABLET | Freq: Once | ORAL | Status: AC
  Administered 2014-06-14: 325 mg via ORAL
  Filled 2014-06-14: qty 1

## 2014-06-14 MED ORDER — HYDROCODONE-ACETAMINOPHEN 5-325 MG PO TABS
1.0000 | ORAL_TABLET | Freq: Four times a day (QID) | ORAL | Status: DC | PRN
  Administered 2014-06-14 – 2014-06-18 (×10): 1 via ORAL
  Filled 2014-06-14 (×12): qty 1

## 2014-06-14 NOTE — ED Notes (Signed)
RN/MD notified of abnormal TROP 2.38

## 2014-06-14 NOTE — Progress Notes (Signed)
ANTICOAGULATION CONSULT NOTE - Initial Consult  Pharmacy Consult for Heparin Indication: NSTEMI  No Known Allergies  Patient Measurements:   06/14/14: Wt 88.5 kg Ht 175 cm  Vital Signs: Temp: 97.8 F (36.6 C) (07/16 0832) Temp src: Oral (07/16 0832) BP: 102/62 mmHg (07/16 0900) Pulse Rate: 69 (07/16 0900)  Labs:  Recent Labs  06/14/14 0748  HGB 14.9  HCT 42.6  PLT 241  CREATININE 0.92  TROPONINI 2.38*    The CrCl is unknown because both a height and weight (above a minimum accepted value) are required for this calculation.   Medical History: Past Medical History  Diagnosis Date  . Diabetes mellitus   . Hypercholesterolemia   . GERD (gastroesophageal reflux disease)   . Hypertension   . Hypercholesteremia     hx of  . Hemorrhoids     hx of  . Peripheral neuropathy   . Depression     Medications:  Scheduled:   Infusions:  . heparin    . heparin     PRN: nitroGLYCERIN  Assessment: 58 y/o M presented to ED with 2-3 day h/o chest pain associated with intermittent shortness of breath and nausea.  Troponin is (+).  EKG shows T-wave changes in inferior leads along with subtle ST elevations in precordial leads that do not, per cardiology, qualify as STEMI.   ASA and nitroglycerin already administered.  Orders received to start IV heparin with pharmacy dosing assistance.   Goal of Therapy:  Heparin level 0.3 - 0.7 Monitor platelets by anticoagulation protocol: Yes   Plan:  1. Heparin 4000 units IV x 1 stat per ACS protocol 2. Heparin infusion starting at 1000 units/hr by IV infusion per ACS protocol. 3. Heparin level at 1600 - anticipate may need to increase infusion rate then. 4. Once heparin level therapeutic x 2 consecutive checks, reduce heparin level monitoring to once daily. 5. CBC daily. 6. Follow clinical course, monitor for evidence of bleeding.  Elie Goody, PharmD, BCPS Pager: [phone] 06/14/2014  9:43 AM   Sharley Keeler, [place] Barban 06/14/2014,9:29 AM

## 2014-06-14 NOTE — H&P (Signed)
[name] [name] is an 58 y.o. male.   Chief Complaint:  Chest pain HPI:   The patient is a 58 yo male with a history of uncontrolled-DM(A1C 11.9), HLD, GERD, barrett's esophagus(no problems in the last seven years), peripheral neuropathy.  He had a heart cath about ten years ago in [place].  He reports CP began a few days ago.  Described as chest pressure, 6-7/10 and worst and currently 2-3/10.  + Nausea, dizziness, diaphoresis, SOB, orthopnea, radiation to wrists.  + abd pain but is chronic for him.    He took the hydrocodone he uses for shoulder pain and it helped.  The patient currently denies vomiting, fever, cough, congestion, hematochezia, melena, lower extremity edema.   Past Medical History  Diagnosis Date  . Diabetes mellitus   . Hypercholesterolemia   . GERD (gastroesophageal reflux disease)   . Hypertension   . Hypercholesteremia     hx of  . Hemorrhoids     hx of  . Peripheral neuropathy   . Depression     Past Surgical History  Procedure Laterality Date  . Tonsillectomy      Family History  Problem Relation Age of Onset  . Hypertension Mother   . Alzheimer's disease Mother   . Alcohol abuse Father   . Diabetes type II Brother    Social History:  reports that he quit smoking about 15 years ago. He has never used smokeless tobacco. He reports that he does not drink alcohol or use illicit drugs.  Allergies: No Known Allergies  Medications Prior to Admission  Medication Sig Dispense Refill  . buPROPion (WELLBUTRIN SR) 150 MG 12 hr tablet Take 150 mg by mouth 2 (two) times daily.      Marland Kitchen HYDROcodone-acetaminophen (NORCO/VICODIN) 5-325 MG per tablet Take 1 tablet by mouth every 6 (six) hours as needed for moderate pain.      Marland Kitchen insulin NPH-regular Human (NOVOLIN 70/30) (70-30) 100 UNIT/ML injection Inject 8-20 Units into the skin 2 (two) times daily with a meal. SSI      . meloxicam (MOBIC) 15 MG tablet Take 15 mg by mouth daily as needed for pain.      . traZODone  (DESYREL) 50 MG tablet Take 50 mg by mouth at bedtime as needed for sleep.        Results for orders placed during the hospital encounter of 06/14/14 (from the past 48 hour(s))  CBG MONITORING, ED     Status: Abnormal   Collection Time    06/14/14  7:37 AM      Result Value Ref Range   Glucose-Capillary 274 (*) 70 - 99 mg/dL  CBC WITH DIFFERENTIAL     Status: None   Collection Time    06/14/14  7:48 AM      Result Value Ref Range   WBC 7.7  4.0 - 10.5 K/uL   RBC 4.90  4.22 - 5.81 MIL/uL   Hemoglobin 14.9  13.0 - 17.0 g/dL   HCT 42.6  39.0 - 52.0 %   MCV 86.9  78.0 - 100.0 fL   MCH 30.4  26.0 - 34.0 pg   MCHC 35.0  30.0 - 36.0 g/dL   RDW 12.6  11.5 - 15.5 %   Platelets 241  150 - 400 K/uL   Neutrophils Relative % 66  43 - 77 %   Neutro Abs 5.1  1.7 - 7.7 K/uL   Lymphocytes Relative 27  12 - 46 %  Lymphs Abs 2.1  0.7 - 4.0 K/uL   Monocytes Relative 6  3 - 12 %   Monocytes Absolute 0.4  0.1 - 1.0 K/uL   Eosinophils Relative 1  0 - 5 %   Eosinophils Absolute 0.1  0.0 - 0.7 K/uL   Basophils Relative 0  0 - 1 %   Basophils Absolute 0.0  0.0 - 0.1 K/uL  COMPREHENSIVE METABOLIC PANEL     Status: Abnormal   Collection Time    06/14/14  7:48 AM      Result Value Ref Range   Sodium 135 (*) 137 - 147 mEq/L   Potassium 4.2  3.7 - 5.3 mEq/L   Chloride 99  96 - 112 mEq/L   CO2 21  19 - 32 mEq/L   Glucose, Bld 337 (*) 70 - 99 mg/dL   BUN 11  6 - 23 mg/dL   Creatinine, Ser 0.92  0.50 - 1.35 mg/dL   Calcium 9.4  8.4 - 10.5 mg/dL   Total Protein 6.7  6.0 - 8.3 g/dL   Albumin 3.5  3.5 - 5.2 g/dL   AST 28  0 - 37 U/L   Comment: SLIGHT HEMOLYSIS     HEMOLYSIS AT THIS LEVEL MAY AFFECT RESULT   ALT 8  0 - 53 U/L   Alkaline Phosphatase 110  39 - 117 U/L   Total Bilirubin <0.2 (*) 0.3 - 1.2 mg/dL   GFR calc non Af Amer >90  >90 mL/min   GFR calc Af Amer >90  >90 mL/min   Comment: (NOTE)     The eGFR has been calculated using the CKD EPI equation.     This calculation has not been  validated in all clinical situations.     eGFR's persistently <90 mL/min signify possible Chronic Kidney     Disease.   Anion gap 15  5 - 15  TROPONIN I     Status: Abnormal   Collection Time    06/14/14  7:48 AM      Result Value Ref Range   Troponin I 2.38 (*) <0.30 ng/mL   Comment:            Due to the release kinetics of cTnI,     a negative result within the first hours     of the onset of symptoms does not rule out     myocardial infarction with certainty.     If myocardial infarction is still suspected,     repeat the test at appropriate intervals.     CRITICAL RESULT CALLED TO, READ BACK BY AND VERIFIED WITH:     WEST,S @ 0902 ON [id number] BY POTEAT,S  URINALYSIS, ROUTINE W REFLEX MICROSCOPIC     Status: Abnormal   Collection Time    06/14/14  8:09 AM      Result Value Ref Range   Color, Urine YELLOW  YELLOW   APPearance CLEAR  CLEAR   Specific Gravity, Urine 1.030  1.005 - 1.030   pH 5.0  5.0 - 8.0   Glucose, UA >1000 (*) NEGATIVE mg/dL   Hgb urine dipstick NEGATIVE  NEGATIVE   Bilirubin Urine NEGATIVE  NEGATIVE   Ketones, ur 15 (*) NEGATIVE mg/dL   Protein, ur NEGATIVE  NEGATIVE mg/dL   Urobilinogen, UA 0.2  0.0 - 1.0 mg/dL   Nitrite NEGATIVE  NEGATIVE   Leukocytes, UA NEGATIVE  NEGATIVE  URINE MICROSCOPIC-ADD ON     Status: None   Collection Time  06/14/14  8:09 AM      Result Value Ref Range   Urine-Other       Value: NO FORMED ELEMENTS SEEN ON URINE MICROSCOPIC EXAMINATION  I-STAT TROPOININ, ED     Status: Abnormal   Collection Time    06/14/14  8:11 AM      Result Value Ref Range   Troponin i, poc 1.74 (*) 0.00 - 0.08 ng/mL   Comment NOTIFIED PHYSICIAN     Comment 3            Comment: Due to the release kinetics of cTnI,     a negative result within the first hours     of the onset of symptoms does not rule out     myocardial infarction with certainty.     If myocardial infarction is still suspected,     repeat the test at appropriate intervals.    Dg Chest 2 View  06/14/2014   CLINICAL DATA:  Chest pain.  EXAM: CHEST  2 VIEW  COMPARISON:  July 21, 2013.  FINDINGS: The heart size and mediastinal contours are within normal limits. Both lungs are clear. No pneumothorax or pleural effusion is noted. The visualized skeletal structures are unremarkable.  IMPRESSION: No acute cardiopulmonary abnormality seen.   Electronically Signed   By: Sabino Dick M.D.   On: 06/14/2014 10:26    Review of Systems  Constitutional: Positive for diaphoresis. Negative for fever.  HENT: Negative for congestion and sore throat.   Respiratory: Positive for shortness of breath. Negative for cough.   Cardiovascular: Positive for chest pain and orthopnea. Negative for leg swelling and PND.  Gastrointestinal: Positive for nausea and abdominal pain (Chronic). Negative for vomiting, blood in stool and melena.  Musculoskeletal: Positive for joint pain.  Neurological: Positive for dizziness.  All other systems reviewed and are negative.   Blood pressure 104/64, pulse 83, temperature 97.8 F (36.6 C), temperature source Oral, resp. rate 17, height _0  (1.753 m), weight 195 lb (88.451 kg), SpO2 98.00%. Physical Exam  Nursing note and vitals reviewed. Constitutional: He is oriented to person, place, and time. He appears well-developed and well-nourished. No distress.  HENT:  Head: Normocephalic and atraumatic.  Mouth/Throat: No oropharyngeal exudate.  Eyes: EOM are normal. Pupils are equal, round, and reactive to light. No scleral icterus.  Neck: Normal range of motion. Neck supple.  Cardiovascular: Normal rate, regular rhythm, S1 normal and S2 normal.   No murmur heard. Pulses:      Radial pulses are 2+ on the right side, and 2+ on the left side.       Dorsalis pedis pulses are 2+ on the right side, and 2+ on the left side.  No Carotid Bruit  Respiratory: Effort normal and breath sounds normal. No respiratory distress. He has no wheezes. He has no rales.   GI: Soft. Bowel sounds are normal. He exhibits no distension. There is no tenderness.  Musculoskeletal: He exhibits no edema.  Lymphadenopathy:    He has no cervical adenopathy.  Neurological: He is alert and oriented to person, place, and time. He exhibits normal muscle tone.  Skin: Skin is warm and dry.  Psychiatric: He has a normal mood and affect.     Assessment/Plan Principal Problem:   NSTEMI (non-ST elevated myocardial infarction) Active Problems:   DM (diabetes mellitus), type 2, uncontrolled    Plan:   Admitted to ICU..  NPO.  Left heart cath today.  On IV heparin.  Start IV  NTG.  Start IV fluids at 159m/hr.  Check lipids, A1C, TSH, cycle troponin.   SS scale insulin.  Will see what A1C is.  He will likely need basil insulin.  I am surprised he is not on it.  ASA, statin, BB.    HTarri Fuller PWaite Park7/16/2015, 10:31 AM   I have examined the patient and reviewed assessment and plan and discussed with patient.  Agree with above as stated.  Ruling in for MI.  PLan for cath.  No bleeding issues in the past.  Stressed the importance of compliance with dual antiplatelet therapy if he does receive his stent. He states he is willing to stay on the medication. He would need aggressive diabetes control. Reviewed ECG. Subtle ECG changes but I do not think it met criteria for a STEMI. He does have some anginal pain. Some of this pain is in his right shoulder for prior orthopedic problems.  [name] Cocker S.

## 2014-06-14 NOTE — Interval H&P Note (Signed)
Cath Lab Visit (complete for each Cath Lab visit)  Clinical Evaluation Leading to the Procedure:   ACS: Yes.    Non-ACS:    Anginal Classification: CCS IV  Anti-ischemic medical therapy: Minimal Therapy (1 class of medications)  Non-Invasive Test Results: No non-invasive testing performed  Prior CABG: No previous CABG      Cath Lab Visit (complete for each Cath Lab visit)  Clinical Evaluation Leading to the Procedure:   ACS: Yes.    Non-ACS:    Anginal Classification: CCS IV  Anti-ischemic medical therapy: Minimal Therapy (1 class of medications)  Non-Invasive Test Results: No non-invasive testing performed  Prior CABG: No previous CABG      History and Physical Interval Note:  06/14/2014 2:42 PM  [name] [name]  has presented today for surgery, with the diagnosis of NStemi  The various methods of treatment have been discussed with the patient and family. After consideration of risks, benefits and other options for treatment, the patient has consented to  Procedure(s): LEFT HEART CATHETERIZATION WITH CORONARY ANGIOGRAM (N/A) as a surgical intervention .  The patient's history has been reviewed, patient examined, no change in status, stable for surgery.  I have reviewed the patient's chart and labs.  Questions were answered to the patient's satisfaction.     VARANASI,JAYADEEP S.

## 2014-06-14 NOTE — Progress Notes (Signed)
UR Completed.  [name] [name] [phone] 06/14/2014  

## 2014-06-14 NOTE — ED Notes (Addendum)
Pt c/o upper chest pain x 2-3 days and varying blood sugars.  Pain score 7/10.  Sts intermittent nausea and SOB.  Hx of DM, DKA, HTN, and high cholesterol.

## 2014-06-14 NOTE — ED Provider Notes (Signed)
CSN: [id number]     Arrival date & time 06/14/14  0730 History   First MD Initiated Contact with Patient 06/14/14 0732     Chief Complaint  Patient presents with  . Chest Pain  . Hyperglycemia     (Consider location/radiation/quality/duration/timing/severity/associated sxs/prior Treatment) The history is provided by the patient.  [name] [name] is a 58 y.o. male hx of DM, HL, GERD, HTN here with chest pain and hypoglycemia. For the last several days he's been having intermittent chest pain that is substernal. Denies radiation to the pain. Pain sometimes gets worse but is not associated with exertion. Also he notes that his blood sugar very steering a day is generally 400s in the morning. He'll slowly nauseous today but no vomiting. He's been taking his NovoLog as prescribed. Had previous admissions for hyperglycemia.    Past Medical History  Diagnosis Date  . Diabetes mellitus   . Hypercholesterolemia   . GERD (gastroesophageal reflux disease)   . Hypertension   . Hypercholesteremia     hx of  . Hemorrhoids     hx of  . Peripheral neuropathy   . Depression    Past Surgical History  Procedure Laterality Date  . Tonsillectomy     Family History  Problem Relation Age of Onset  . Hypertension Mother   . Alzheimer's disease Mother   . Alcohol abuse Father   . Diabetes type II Brother    History  Substance Use Topics  . Smoking status: Former Smoker -- 0.25 packs/day for 3 years    Quit date: 11/30/1998  . Smokeless tobacco: Never Used  . Alcohol Use: No    Review of Systems  Cardiovascular: Positive for chest pain.  Gastrointestinal: Positive for nausea.  All other systems reviewed and are negative.     Allergies  Review of patient's allergies indicates no known allergies.  Home Medications   Prior to Admission medications   Medication Sig Start Date End Date Taking? Authorizing Provider  buPROPion (WELLBUTRIN SR) 150 MG 12 hr tablet Take 1 tablet (150 mg total)  by mouth daily. 10/04/12   Alison Murray, MD  HYDROcodone-acetaminophen (NORCO/VICODIN) 5-325 MG per tablet Take 1 tablet by mouth every 6 (six) hours as needed. pain 07/24/13   Belkys A Regalado, MD  insulin NPH-regular (NOVOLIN 70/30) (70-30) 100 UNIT/ML injection Inject 30 Units into the skin 2 (two) times daily with a meal. 07/24/13   Belkys A Regalado, MD  zolpidem (AMBIEN) 10 MG tablet Take 1 tablet (10 mg total) by mouth at bedtime as needed for sleep. Sleep. 10/04/12   Alison Murray, MD   BP 115/70  Pulse 81  Temp(Src) 97.8 F (36.6 C) (Oral)  Resp 18  SpO2 100% Physical Exam  Nursing note and vitals reviewed. Constitutional: He is oriented to person, place, and time. He appears well-developed and well-nourished.  HENT:  Head: Normocephalic.  MM slightly dry   Eyes: Conjunctivae are normal. Pupils are equal, round, and reactive to light.  Neck: Normal range of motion. Neck supple.  Cardiovascular: Normal rate, regular rhythm and normal heart sounds.   Pulmonary/Chest: Effort normal and breath sounds normal. No respiratory distress. He has no wheezes. He has no rales.  Abdominal: Soft. Bowel sounds are normal. He exhibits no distension. There is no tenderness. There is no rebound and no guarding.  Musculoskeletal: Normal range of motion. He exhibits no edema and no tenderness.  Neurological: He is alert and oriented to person, place, and time. No  cranial nerve deficit. Coordination normal.  Skin: Skin is warm and dry.  Psychiatric: He has a normal mood and affect. His behavior is normal. Judgment and thought content normal.    ED Course  Procedures (including critical care time)  CRITICAL CARE Performed by: Silverio LayYAO, DAVID   Total critical care time: 30 min   Critical care time was exclusive of separately billable procedures and treating other patients.  Critical care was necessary to treat or prevent imminent or life-threatening deterioration.  Critical care was time spent  personally by me on the following activities: development of treatment plan with patient and/or surrogate as well as nursing, discussions with consultants, evaluation of patient's response to treatment, examination of patient, obtaining history from patient or surrogate, ordering and performing treatments and interventions, ordering and review of laboratory studies, ordering and review of radiographic studies, pulse oximetry and re-evaluation of patient's condition.   Labs Review Labs Reviewed  COMPREHENSIVE METABOLIC PANEL - Abnormal; Notable for the following:    Sodium 135 (*)    Glucose, Bld 337 (*)    Total Bilirubin <0.2 (*)    All other components within normal limits  CBG MONITORING, ED - Abnormal; Notable for the following:    Glucose-Capillary 274 (*)    All other components within normal limits  CBC WITH DIFFERENTIAL  URINALYSIS, ROUTINE W REFLEX MICROSCOPIC  TROPONIN I  I-STAT TROPOININ, ED    Imaging Review No results found.   EKG Interpretation   Date/Time:  Thursday June 14 2014 07:46:18 EDT Ventricular Rate:  78 PR Interval:  141 QRS Duration: 77 QT Interval:  356 QTC Calculation: 405 R Axis:   50 Text Interpretation:  Sinus rhythm PR depression inferior leads with TWI  inferior leads. TWI new since previous tracing  Baseline wander in lead(s)  II III aVF Confirmed by YAO  MD, DAVID ([id number]) on 06/14/2014 7:50:30 AM      MDM   Final diagnoses:  None    [name] PianKevin [name] is a 58 y.o. male here with chest pain, hyperglycemia. Will check CBG. Will check labs and trop and CXR. Given hx of DM, at risk for CAD so will get trop x 2.   8:47 AM Istat trop positive 1.7. Repeated EKG 3 times. Some inferior TWI that is new. Subtle ST elevated leads V3, V4. I called Dr. Eldridge DaceVaranasi, who reviewed EKG and doesn't qualify for STEMI so STEMI not activated as per cardiology. Started on heparin. Pain controlled with PO nitro. Will admit to CCU.     Richardean Canalavid H Yao, MD 06/14/14  [phone]

## 2014-06-14 NOTE — CV Procedure (Signed)
PROCEDURE:  Left heart catheterization with selective coronary angiography, left ventriculogram. Aspiration thrombectomy of the RCA. PCI of the RCA  INDICATIONS:  Non-ST elevation MI  The risks, benefits, and details of the procedure were explained to the patient.  The patient verbalized understanding and wanted to proceed.  Informed written consent was obtained.  PROCEDURE TECHNIQUE:  After Xylocaine anesthesia a 43F slender sheath was placed in the right radial artery with a single anterior needle wall stick.   IV heparin was given. Right coronary angiography was done using a Judkins R4 guide catheter.  Left coronary angiography was done using an EBU 3.0 guide catheter.  The intervention was performed. Please see below for details. Left ventriculography was done using a pigtail catheter.  A TR band was used for hemostasis.   CONTRAST:  Total of 175 cc.  COMPLICATIONS:  None.    HEMODYNAMICS:  Aortic pressure was 94/61; LV pressure was 91/17; LVEDP 20.  There was no gradient between the left ventricle and aorta.    ANGIOGRAPHIC DATA:   The left main coronary artery is a short vessel, but widely patent.  The left anterior descending artery is a large vessel. There is mild atherosclerosis proximally. There is a large first diagonal. After the origin of the diagonal in the mid LAD, there is a 25%, eccentric stenosis. The mid to distal LAD is widely patent. There are collaterals from the septal vessels to the distal RCA.  The left circumflex artery is a large vessel.  The first obtuse marginal is small but patent. The second obtuse marginal is a large vessel which branches across the lateral wall. It appears angiographic normal. The remainder of the circumflex is medium size. There are several terminal obtuse marginal vessels which are patent.  The right coronary artery is a large, dominant vessel. The vessel has an anterior takeoff and requires an Amplatz guide to reach. The vessel was  occluded proximally. After the intervention, it was noted that there is a shepherd's crux proximally. There is tortuosity in the distal vessel as well. Posterior descending artery is medium size. The posterior lateral artery a small. There is only minimal atherosclerosis of the RCA system outside of the stented segment.  LEFT VENTRICULOGRAM:  Left ventricular angiogram was done in the 30 RAO projection and revealed mild basal inferior hypokinesis with overall normal systolic function with an estimated ejection fraction of 50 %.  LVEDP was 20 mmHg.  PCI NARRATIVE: Initially, a Williams right guide catheter was used. This was unsuccessful at reaching the ostium of the RCA. An AL-1 catheter was then successfully used to engage the RCA. A pro-water wire was placed across the occlusion in the proximal vessel. A priority 1 aspiration catheter was advanced for aspiration thrombectomy. There was improvement of flow after aspiration thrombectomy. A 2.5 x 15 balloon was then used to treat the thrombotic area in the proximal right coronary artery. It was noted that there appeared to be a lesion in the distal RCA. The wire was pulled back and this turned out to be only pseudo-lesion. The diseased area in the proximal RCA was stented with a 2.75 x 23 mm Xience drug-eluting stent.  The stent was post dilated with a 3.0 x 15 noncompliant balloon several inflations, to 3.1 mm in diameter. Several doses of intra-coronary nitroglycerin were given during the procedure. The patient tolerated the procedure well.  IMPRESSIONS:  1. Normal left main coronary artery. 2. Mild disease in the left anterior  descending artery and its branches. 3. Minimal disease in the left circumflex artery and its branches. 4. Occluded proximal right coronary artery with left to right collaterals. The proximal right coronary artery was successfully treated with a 2.75 x 23mm Xience drug-eluting stent, postdilated to 3.1 mm in diameter. An Amplatz 1  guide catheter was needed to reach the ostium of the RCA. There is significant pseudo-lesion noted when the wire was in place, but this resolved with wire removal. 5. Overall normal left ventricular systolic function.  LVEDP 20 mmHg.  Ejection fraction 50 %.  RECOMMENDATION:  I stressed the importance of dual antiplatelet therapy for at least a year to the patient. We also talked about diabetes control to help prevent future coronary events. He will be watched in the hospital overnight. His blood pressure is on the low side and may limit initiation of beta blocker and ACE inhibitor. He will need statin therapy.  We'll use prasugrel and aspirin.  He'll need significant education about cardiac risk factors. He would benefit from cardiac rehabilitation.

## 2014-06-15 ENCOUNTER — Other Ambulatory Visit: Payer: Self-pay

## 2014-06-15 DIAGNOSIS — I214 Non-ST elevation (NSTEMI) myocardial infarction: Secondary | ICD-10-CM

## 2014-06-15 DIAGNOSIS — I219 Acute myocardial infarction, unspecified: Secondary | ICD-10-CM

## 2014-06-15 LAB — BASIC METABOLIC PANEL
Anion gap: 15 (ref 5–15)
Anion gap: 12 (ref 5–15)
BUN: 13 mg/dL (ref 6–23)
BUN: 13 mg/dL (ref 6–23)
CALCIUM: 7.9 mg/dL — AB (ref 8.4–10.5)
CHLORIDE: 101 meq/L (ref 96–112)
CO2: 20 mEq/L (ref 19–32)
CO2: 23 meq/L (ref 19–32)
CREATININE: 0.89 mg/dL (ref 0.50–1.35)
CREATININE: 0.8 mg/dL (ref 0.50–1.35)
Calcium: 8.3 mg/dL — ABNORMAL LOW (ref 8.4–10.5)
Chloride: 99 mEq/L (ref 96–112)
GFR calc Af Amer: >90 mL/min (ref >90)
GFR calc non Af Amer: >90 mL/min (ref >90)
GFR calc non Af Amer: >90 mL/min (ref >90)
GLUCOSE: 214 mg/dL — AB (ref 70–99)
Glucose, Bld: 385 mg/dL — ABNORMAL HIGH (ref 70–99)
Potassium: 4.8 mEq/L (ref 3.7–5.3)
Potassium: 4.4 mEq/L (ref 3.7–5.3)
Sodium: 134 mEq/L — ABNORMAL LOW (ref 137–147)
Sodium: 136 mEq/L — ABNORMAL LOW (ref 137–147)

## 2014-06-15 LAB — GLUCOSE, CAPILLARY
Glucose-Capillary: 360 mg/dL — ABNORMAL HIGH (ref 70–99)
Glucose-Capillary: 383 mg/dL — ABNORMAL HIGH (ref 70–99)
Glucose-Capillary: 138 mg/dL — ABNORMAL HIGH (ref 70–99)
Glucose-Capillary: 193 mg/dL — ABNORMAL HIGH (ref 70–99)
Glucose-Capillary: 219 mg/dL — ABNORMAL HIGH (ref 70–99)

## 2014-06-15 LAB — CBC
HEMATOCRIT: 37 % — AB (ref 39.0–52.0)
Hemoglobin: 12.4 g/dL — ABNORMAL LOW (ref 13.0–17.0)
MCH: 29.8 pg (ref 26.0–34.0)
MCHC: 33.5 g/dL (ref 30.0–36.0)
MCV: 88.9 fL (ref 78.0–100.0)
Platelets: 210 10*3/uL (ref 150–400)
RBC: 4.16 MIL/uL — AB (ref 4.22–5.81)
RDW: 12.9 % (ref 11.5–15.5)
WBC: 6.1 10*3/uL (ref 4.0–10.5)

## 2014-06-15 LAB — LIPID PANEL
CHOLESTEROL: 163 mg/dL (ref 0–200)
HDL: 41 mg/dL (ref >39)
LDL Cholesterol: 88 mg/dL (ref 0–99)
TRIGLYCERIDES: 172 mg/dL — AB (ref <150)
Total CHOL/HDL Ratio: 4 RATIO
VLDL: 34 mg/dL (ref 0–40)

## 2014-06-15 LAB — TROPONIN I: TROPONIN I: 7.27 ng/mL — AB (ref <0.30)

## 2014-06-15 MED ORDER — INSULIN ASPART 100 UNIT/ML ~~LOC~~ SOLN
0.0000 [IU] | Freq: Every day | SUBCUTANEOUS | Status: DC
  Administered 2014-06-15: 2 [IU] via SUBCUTANEOUS

## 2014-06-15 MED ORDER — LIVING WELL WITH DIABETES BOOK
Freq: Once | Status: AC
  Administered 2014-06-15: 13:00:00
  Filled 2014-06-15: qty 1

## 2014-06-15 MED ORDER — INSULIN GLARGINE 100 UNIT/ML ~~LOC~~ SOLN
25.0000 [IU] | Freq: Every day | SUBCUTANEOUS | Status: DC
  Administered 2014-06-15 – 2014-06-18 (×4): 25 [IU] via SUBCUTANEOUS
  Filled 2014-06-15 (×4): qty 0.25

## 2014-06-15 MED ORDER — ALUM & MAG HYDROXIDE-SIMETH 200-200-20 MG/5ML PO SUSP
15.0000 mL | ORAL | Status: DC | PRN
  Administered 2014-06-15: 15 mL via ORAL
  Filled 2014-06-15: qty 30

## 2014-06-15 MED ORDER — INSULIN ASPART 100 UNIT/ML ~~LOC~~ SOLN
0.0000 [IU] | Freq: Three times a day (TID) | SUBCUTANEOUS | Status: DC
  Administered 2014-06-16: 5 [IU] via SUBCUTANEOUS

## 2014-06-15 NOTE — Consult Note (Signed)
Family Medicine Teaching Service Attending Note  I interviewed and examined patient [name] [name] and reviewed their tests and x-rays.  I discussed with Dr. Benjamin Stain and reviewed their note for today.  I agree with their assessment and plan.     Additionally  Thank you for this consult We will follow his blood sugar in the hospital and try to ensure appropriate follow up as an outpatient

## 2014-06-15 NOTE — Progress Notes (Addendum)
Inpatient Diabetes Program Recommendations  AACE/ADA: New Consensus Statement on Inpatient Glycemic Control (2013)  Target Ranges:  Prepandial:   less than 140 mg/dL      Peak postprandial:   less than 180 mg/dL (1-2 hours)      Critically ill patients:  140 - 180 mg/dL     Results for QUSAY, KOVACK (MRN [id number]) as of 06/15/2014 12:19  Ref. Range 06/14/2014 23:24 06/15/2014 03:22 06/15/2014 07:20 06/15/2014 11:51  Glucose-Capillary Latest Range: 70-99 mg/dL 762 (H) 831 (H) 517 (H) 138 (H)    Results for RYLYN, DYAS (MRN [id number]) as of 06/15/2014 12:19  Ref. Range 06/14/2014 12:40  Hemoglobin A1C Latest Range: <5.7 % 11.0 (H)     Admitted with CP.  History of DM, HTN.  Home DM Meds:  70/30 insulin- 30 units bid with meals Regular insulin- 10 units bid   Patient told me he was taking Lantus and Novolog a while ago, but had to switch to 70/30 insulin for cost.  Was using Lantus and Novolog pens when he was using Lantus and Novolog.  Patient told me his 70/30 insulin and Regular insulin are not convenient for him b/c they are in vial form.  Patient told me he is often very busy with church and family and can't always take his insulin with him.  Prefers insulin pens b/c he feels like he can take the pens with him wherever he goes.  Often misses doses of insulin b/c he is not home to take it.  Does not check CBGs often b/c he is trying to conserve CBG strips.  Asked patient what kind of CBG meter he has.  Patient stated he has an older Accu check meter.  Encouraged patient to go to [facility] or [facility] and purchase a CBG meter OTC at one of these stores.  Meter at [facility] is $16 and a box of 50 strips is $9.  Patient agreeable.   Spoke to patient about his current A1c of 11%.  Explained what an A1c is and what it measures.  Reminded patient that his goal A1c is 7% or less per ADA standards to prevent both acute and long-term complications.  Encouraged patient to check his CBGs at least bid at  home (fasting and another check within the day) and to record all CBGs in a logbook for his PCP to review.  Will order Living Well With DM booklet for patient for review.  Will also ask RN to please have patient watch DM videos on patient education network for review.   MD- Patient states he would like Prescriptions for both Lantus and Novolog insulin pens at d/c.  Per patient, his insurance starts on 08/01 and he would like to switch back to Lantus and Novolog.  Will also need Rx for insulin pen needles.    Will folllow Ambrose Finland RN, MSN, CDE Diabetes Coordinator Inpatient Diabetes Program Team Pager: [phone] (8a-10p)

## 2014-06-15 NOTE — Progress Notes (Signed)
CARDIAC REHAB PHASE I   PRE:  Rate/Rhythm: 83 SR    BP: sitting 105/57    SaO2:   MODE:  Ambulation: 350 ft   POST:  Rate/Rhythm: 95 SR    BP: sitting 110/57     SaO2:   Steady, slow pace. Very flat affect, would presume this is normal for him. Denied problems except wrist pain, asked for pain meds. Began ed with family present. Gave diet sheets, pt seems versed in DM diet. Sts he was buying over-the-counter insulin at [facility] without a PCP to guide him. Plans to get PCP in August when extra insurance begins. Interested in [facility] and will send referral to [place] [facility]. [phone]   Harriet Masson CES, ACSM 06/15/2014 2:45 PM

## 2014-06-15 NOTE — Progress Notes (Signed)
Family Practice Teaching Service has been consulted for diabetes management. Peightyn [name] PA-C

## 2014-06-15 NOTE — Care Management Note (Addendum)
    Page 1 of 1   06/18/2014     10:46:14 AM CARE MANAGEMENT NOTE 06/18/2014  Patient:  [name],[name]   Account Number:  [id number]  Date Initiated:  06/14/2014  Documentation initiated by:  Avie Arenas  Subjective/Objective Assessment:   Admitted with NSTEMI     Action/Plan:   Anticipated DC Date:  06/19/2014   Anticipated DC Plan:  HOME/SELF CARE      DC Planning Services  CM consult      Choice offered to / List presented to:             Status of service:  Completed, signed off Medicare Important Message given?  YES (If response is "NO", the following Medicare IM given date fields will be blank) Date Medicare IM given:  06/18/2014 Medicare IM given by:  GRAVES-BIGELOW,Sheilla Maris Date Additional Medicare IM given:   Additional Medicare IM given by:    Discharge Disposition:  HOME/SELF CARE  Per UR Regulation:  Reviewed for med. necessity/level of care/duration of stay  If discussed at Long Length of Stay Meetings, dates discussed:    Comments:  [place], [place] [phone]   06-18-14 [address], [place] [phone] CM  spoke to pt and his Rx drug coverage will be active June 30, 2014. No patient assistance needed.  06-18-14 [address] [place] [phone] Pt to go home on effient. Pt has effient 30 day free card. Pt uses [facility] on [address]. CM did call and medication is available, however cost will be 400.00. CM to make pt aware and will provide pt with patient assistance forms for assist for year supply. No further needs from CM at this time.    06-15-14 3:15pm Avie Arenas, RNBSN [phone] Given Effient prescription cards.  Talked to about PCP,  He will need to check with his insurance to see what physicians are in network for him.

## 2014-06-15 NOTE — Progress Notes (Signed)
  Echocardiogram 2D Echocardiogram has been performed.  [name] [name] 06/15/2014, 10:23 AM

## 2014-06-15 NOTE — Consult Note (Signed)
[facility] Pager: [phone]  Patient name: [name] [name] Medical record number: [id number] Date of birth: 1956-03-26 Age: 58 y.o. Gender: male  Primary Care Provider: Ahmaud FentonBradshaw, Samuel, MD  Chief Complaint: Consult for management of uncontrolled diabetes  Assessment and Plan: [name] [name] is a 58 y.o. male presenting to the hospital and admitted with NSTEMI. PMH is significant for uncontrolled DM (A1c 11.9 last checked 78/22/14), HLD, GERD, barrett's esophagus, peripheral neuropathy. Family Practice Teaching Service is being consulted for uncontrolled DM. Well-appearing with blood sugar in 380s and gap 15 on BMET, but no signs of HHONK. - Repeat A1c 06/14/14 >> 11 - Fasting lipid panel 06/15/14 AM >>10-year ASCVD risk is 11.3%, lifetime is 50%. This is considering he is no longer a smoker. He quit 15 years ago, and prior use for years likely contributes to overall increased risk than that listed. Start high-intensity statin atorvastatin 40mg  daily which was started on admission. - Glucose this morning 385 and pt was giving 20 units of his resistant SSI insulin aspart. - Total home insulin reported at 80 units daily (70/30 30 units twice a day and insulin R 10 units twice a day). Will add lantus 25 units daily starting today and continue resistant sliding scale insulin and monitor insulin needs to titrate Lantus tomorrow. -Changed diet from heart healthy to heart healthy and carb modified. - DM coordinator consulted for education. - Would benefit from ACE-i once BP is not borderline low. -Recommended outpatient followup for diabetic eye exam and foot exam. -Recommended much closer follow up with PCP. - Recheck BMET this afternoon.  Disposition: Per primary service  History of Present Illness: [name] [name] is a 58 y.o. male presenting with chest pain and found to have an nSTEMI. He is a history of uncontrolled diabetes with reportedly previously  well-controlled diabetes however A1c one year ago was 11.9. He has not seen his PCP in around one year. He reports taking insulin 70/30 at 30 units twice a day and insulin R at 10 units twice a day. He reports never missing doses but occasionally missing the right time of the dose. He denies chest pain or shortness of breath at this time that he did have this this past week. He is also frequently have polyuria, polydipsia, and blurred vision over the past few months. Blood sugars at home are reportedly 118 to the 600s, being quite variable from day to day. He previously took Lantus and NovoLog but ran out of insurance and it was no longer affordable.  however he now has Medicare. He is in a college PE class and exercises 2 times a week for the past 8 weeks. He reports knowing dietary sources of carbohydrates and trying T. more fruits, vegetables, and healthy snacks. He does not think he has had an eye exam this year. He denies any foot sores.  Review Of Systems: Per HPI with the following additions: None Otherwise 12 point review of systems was performed and was unremarkable.  Patient Active Problem List   Diagnosis Date Noted  . NSTEMI (non-ST elevated myocardial infarction) 06/14/2014  . Acute myocardial infarction of other inferior wall, initial episode of care 06/14/2014  . DKA (diabetic ketoacidoses) 07/21/2013  . Nausea & vomiting 07/21/2013  . Vomiting 07/21/2013  . Left shoulder pain 07/21/2013  . Abdominal pain 07/21/2013  . Chronic pain in left shoulder 04/06/2013  . Peripheral neuropathy 02/28/2012  . DKA, type 2 01/10/2012  . Hyperkalemia 01/10/2012  . Depression  12/25/2011  . GERD (gastroesophageal reflux disease) 12/25/2011  . DM (diabetes mellitus), type 2, uncontrolled 11/18/2011   Past Medical History: Past Medical History  Diagnosis Date  . Diabetes mellitus   . Hypercholesterolemia   . GERD (gastroesophageal reflux disease)   . Hypertension   . Hypercholesteremia      hx of  . Hemorrhoids     hx of  . Peripheral neuropathy   . Depression    Past Surgical History: Past Surgical History  Procedure Laterality Date  . Tonsillectomy     Social History: History  Substance Use Topics  . Smoking status: Former Smoker -- 0.25 packs/day for 3 years    Quit date: 11/30/1998  . Smokeless tobacco: Never Used  . Alcohol Use: No   Additional social history:  Patient is on disability for shoulder and knee pain He is currently in school taking college courses. He reports knowing dietary sources of carbohydrates and exercising twice weekly  Please also refer to relevant sections of EMR.  Family History: Family History  Problem Relation Age of Onset  . Hypertension Mother   . Alzheimer's disease Mother   . Alcohol abuse Father   . Diabetes type II Brother    Allergies and Medications: No Known Allergies No current facility-administered medications on file prior to encounter.   No current outpatient prescriptions on file prior to encounter.    Objective: BP 119/63  Pulse 78  Temp(Src) 98.2 F (36.8 C) (Oral)  Resp 13  Ht 5\' 9"  (1.753 m)  Wt 206 lb 9.1 oz (93.7 kg)  BMI 30.49 kg/m2  SpO2 95% Exam: General: NAD HEENT: Atraumatic, normocephalic, sclera clear, extraocular movements intact Cardiovascular: Regular rate and rhythm, no murmurs rubs or gallops, 2+ radial pulses Respiratory: Clear to auscultation bilaterally, normal effort Abdomen: Soft, nondistended Extremities: Right forearm with clean, dry dressing in place, no lower extremity edema or calf tenderness Skin: No rash or cyanosis  Neuro: awake, alert, normal speech, no focal deficits  Labs and Imaging: CBC BMET   Recent Labs Lab 06/15/14 0329  WBC 6.1  HGB 12.4*  HCT 37.0*  PLT 210    Recent Labs Lab 06/15/14 0329  NA 134*  K 4.8  CL 99  CO2 20  BUN 13  CREATININE 0.89  GLUCOSE 385*  CALCIUM 7.9*     A1c: 11  Leona Singleton, MD 06/15/2014, 8:47  AM PGY-3,  Family Medicine FPTS Intern pager: [phone], text pages welcome

## 2014-06-15 NOTE — Progress Notes (Signed)
Patient ID: [name] [name], male   DOB: September 01, 1956, 58 y.o.   MRN: [id number]    Subjective:  Denies SSCP, palpitations or Dyspnea Cath sight right wrist sore   Objective:  Filed Vitals:   06/15/14 0500 06/15/14 0600 06/15/14 0655 06/15/14 0716  BP: 114/68 122/76  119/63  Pulse: 77 86 76 78  Temp:    98.2 F (36.8 C)  TempSrc:    Oral  Resp: 14 15 11 13   Height:      Weight:      SpO2: 96% 98% 95% 95%    Intake/Output from previous day:  Intake/Output Summary (Last 24 hours) at 06/15/14 0830 Last data filed at 06/15/14 0600  Gross per 24 hour  Intake 1560.41 ml  Output   2750 ml  Net -1189.59 ml    Physical Exam: Affect appropriate Healthy:  appears stated age HEENT: normal Neck supple with no adenopathy JVP normal no bruits no thyromegaly Lungs clear with no wheezing and good diaphragmatic motion Heart:  S1/S2 no murmur, no rub, gallop or click PMI normal Abdomen: benighn, BS positve, no tenderness, no AAA no bruit.  No HSM or HJR Distal pulses intact with no bruits No edema Neuro non-focal Skin warm and dry No muscular weakness Right wrist sore at cath sight pulse ok   Lab Results: Basic Metabolic Panel:  Recent Labs  26/83/41 0748 06/15/14 0329  NA 135* 134*  K 4.2 4.8  CL 99 99  CO2 21 20  GLUCOSE 337* 385*  BUN 11 13  CREATININE 0.92 0.89  CALCIUM 9.4 7.9*   Liver Function Tests:  Recent Labs  06/14/14 0748  AST 28  ALT 8  ALKPHOS 110  BILITOT <0.2*  PROT 6.7  ALBUMIN 3.5   CBC:  Recent Labs  06/14/14 0748 06/15/14 0329  WBC 7.7 6.1  NEUTROABS 5.1  --   HGB 14.9 12.4*  HCT 42.6 37.0*  MCV 86.9 88.9  PLT 241 210   Cardiac Enzymes:  Recent Labs  06/14/14 1240 06/14/14 1835 06/14/14 2330  TROPONINI 2.50* 3.15* 7.27*   Hemoglobin A1C:  Recent Labs  06/14/14 1240  HGBA1C 11.0*   Fasting Lipid Panel:  Recent Labs  06/15/14 0329  CHOL 163  HDL 41  LDLCALC 88  TRIG 172*  CHOLHDL 4.0   Thyroid Function  Tests:  Recent Labs  06/14/14 1240  TSH 1.890    Imaging: Dg Chest 2 View  06/14/2014   CLINICAL DATA:  Chest pain.  EXAM: CHEST  2 VIEW  COMPARISON:  July 21, 2013.  FINDINGS: The heart size and mediastinal contours are within normal limits. Both lungs are clear. No pneumothorax or pleural effusion is noted. The visualized skeletal structures are unremarkable.  IMPRESSION: No acute cardiopulmonary abnormality seen.   Electronically Signed   By: Roque Lias M.D.   On: 06/14/2014 10:26    Cardiac Studies:  ECG:   NSR normal ECG    Telemetry:  NSR normal no VT   Echo:  pending  Medications:   . aspirin EC  81 mg Oral Daily  . atorvastatin  40 mg Oral q1800  . buPROPion  150 mg Oral BID  . insulin aspart  0-20 Units Subcutaneous TID WC  . metoprolol tartrate  12.5 mg Oral BID  . prasugrel  10 mg Oral Daily       Assessment/Plan:  IMI:  S/P PCI RCA Had collaterals and ECG normal Echo ordered but suspect EF will be preserved  Continue  ASA/Effient and beta blocker Chol:  On statin DM:  Poorly controlled A1c over 10  Have asked hospitalist to see to adjust meds  On SS in hospital  At home split dose 70/30  Dietary consult OOB to chair home likely Sunday  Hct has dropped and troponin still rising   Charlton Hawseter Vidalia Serpas 06/15/2014, 8:30 AM

## 2014-06-16 DIAGNOSIS — K219 Gastro-esophageal reflux disease without esophagitis: Secondary | ICD-10-CM

## 2014-06-16 DIAGNOSIS — I2119 ST elevation (STEMI) myocardial infarction involving other coronary artery of inferior wall: Secondary | ICD-10-CM

## 2014-06-16 LAB — CBC
HCT: 39.4 % (ref 39.0–52.0)
HEMOGLOBIN: 13.5 g/dL (ref 13.0–17.0)
MCH: 30.3 pg (ref 26.0–34.0)
MCHC: 34.3 g/dL (ref 30.0–36.0)
MCV: 88.3 fL (ref 78.0–100.0)
Platelets: 207 10*3/uL (ref 150–400)
RBC: 4.46 MIL/uL (ref 4.22–5.81)
RDW: 12.5 % (ref 11.5–15.5)
WBC: 5.1 10*3/uL (ref 4.0–10.5)

## 2014-06-16 LAB — GLUCOSE, CAPILLARY
GLUCOSE-CAPILLARY: 109 mg/dL — AB (ref 70–99)
GLUCOSE-CAPILLARY: 134 mg/dL — AB (ref 70–99)
GLUCOSE-CAPILLARY: 245 mg/dL — AB (ref 70–99)
Glucose-Capillary: 236 mg/dL — ABNORMAL HIGH (ref 70–99)
Glucose-Capillary: 290 mg/dL — ABNORMAL HIGH (ref 70–99)
Glucose-Capillary: 54 mg/dL — ABNORMAL LOW (ref 70–99)

## 2014-06-16 LAB — BASIC METABOLIC PANEL
Anion gap: 14 (ref 5–15)
BUN: 11 mg/dL (ref 6–23)
CO2: 23 mEq/L (ref 19–32)
Calcium: 8.2 mg/dL — ABNORMAL LOW (ref 8.4–10.5)
Chloride: 102 mEq/L (ref 96–112)
Creatinine, Ser: 0.75 mg/dL (ref 0.50–1.35)
GFR calc Af Amer: >90 mL/min (ref >90)
GFR calc non Af Amer: >90 mL/min (ref >90)
Glucose, Bld: 183 mg/dL — ABNORMAL HIGH (ref 70–99)
POTASSIUM: 3.8 meq/L (ref 3.7–5.3)
Sodium: 139 mEq/L (ref 137–147)

## 2014-06-16 MED ORDER — PANTOPRAZOLE SODIUM 40 MG PO TBEC
40.0000 mg | DELAYED_RELEASE_TABLET | Freq: Every day | ORAL | Status: DC
  Administered 2014-06-16 – 2014-06-18 (×3): 40 mg via ORAL
  Filled 2014-06-16 (×3): qty 1

## 2014-06-16 MED ORDER — INSULIN ASPART 100 UNIT/ML ~~LOC~~ SOLN
0.0000 [IU] | Freq: Three times a day (TID) | SUBCUTANEOUS | Status: DC
  Administered 2014-06-16: 2 [IU] via SUBCUTANEOUS
  Administered 2014-06-16: 8 [IU] via SUBCUTANEOUS
  Administered 2014-06-17: 3 [IU] via SUBCUTANEOUS

## 2014-06-16 NOTE — Consult Note (Signed)
Family Medicine Teaching Service Daily Progress Note Intern Pager: [phone]  Patient name: [name] [name] Medical record number: [id number] Date of birth: Jun 23, 1956 Age: 58 y.o. Gender: male  Primary Care Provider: Patricio Fenton, MD  Consultation  Note  Pt Overview and Major Events to Date:  7/16: Pt presented with NSTEMI, Heart Cath 7/17: FMTS consulted for DM management   Assessment and Plan:  This is a 58 y/o male presenting with a NSTEMI with a PMH of uncontrolled DM, HLD, GERD, Barrett's esophagus, and peripheral neuropathy. Family Practice Teaching Service was consulted for DM management. Repeat HbA1c 11.0, fasting lipid panel from 06/14/14 10 year risk is 11.3% with a lifetime risk of 69% without calculating in that he was previously a smoker (quit approximately 15 years ago).  - Blood glucose O/N: 138-245 - Resistant SSI: has required 14units in the last 24hrs. - Will continue with Lantus 25 units daily.  - Will decrease to Moderate SSI -Continue to monitor CBGs to determine appropriate discharge regimen. - Low dose ACE inhibitor started - Recommend outpt follow up for diabetic eye exam and foot exam  - On discharge, pt requests Rx for Lantus, Novolog, and insulin pen needles  FEN/GI: Heart healthy/Carb-modified, NS IV lock  PPx: Prasugrel  Disposition: Heart  Subjective:  Patient doing well O/N. Denies chest pain or SOB. Does endorse right wrist pain at the site of an old IV placement. Feels his sugars tend to get the highest overnight.         Objective: Temp:  [97.8 F (36.6 C)-98.3 F (36.8 C)] 97.9 F (36.6 C) (07/18 0404) Pulse Rate:  [72-83] 78 (07/18 0404) Resp:  [14-18] 16 (07/18 0404) BP: (94-120)/(49-72) 105/58 mmHg (07/18 0404) SpO2:  [94 %-98 %] 98 % (07/18 0404) Weight:  [206 lb 12.7 oz (93.8 kg)] 206 lb 12.7 oz (93.8 kg) (07/18 0404) Physical Exam: General: NAD  HEENT: Atraumatic, normocephalic, sclera clear, extraocular movements intact   Cardiovascular: Regular rate and rhythm, no murmurs rubs or gallops, 2+ radial and DP pulses bilaterally  Respiratory: Clear to auscultation bilaterally, no wheezing, crackles, or crackles noted. normal effort  Abdomen: Soft, nondistended  Extremities: No lower extremity edema or calf tenderness noted. No gross deformities. Skin: No rash or cyanosis  Neuro: awake, alert, normal speech, no focal deficits   Laboratory:  Recent Labs Lab 06/14/14 0748 06/15/14 0329 06/16/14 0320  WBC 7.7 6.1 5.1  HGB 14.9 12.4* 13.5  HCT 42.6 37.0* 39.4  PLT 241 210 207    Recent Labs Lab 06/14/14 0748 06/15/14 0329 06/15/14 1628 06/16/14 0320  NA 135* 134* 136* 139  K 4.2 4.8 4.4 3.8  CL 99 99 101 102  CO2 21 20 23 23   BUN 11 13 13 11   CREATININE 0.92 0.89 0.80 0.75  CALCIUM 9.4 7.9* 8.3* 8.2*  PROT 6.7  --   --   --   BILITOT <0.2*  --   --   --   ALKPHOS 110  --   --   --   ALT 8  --   --   --   AST 28  --   --   --   GLUCOSE 337* 385* 214* 183*   Risk Stratification Labs  TSH    Component Value Date/Time   TSH 1.890 06/14/2014 1240   Hemoglobin A1C    Component Value Date/Time   HGBA1C 11.0* 06/14/2014 1240   HGBA1C >14.0 04/06/2013 1345   Lipid Panel     Component  Value Date/Time   CHOL 163 06/15/2014 0329   TRIG 172* 06/15/2014 0329   HDL 41 06/15/2014 0329   CHOLHDL 4.0 06/15/2014 0329   VLDL 34 06/15/2014 0329   LDLCALC 88 06/15/2014 0329     Imaging/Diagnostic Tests:  Left Heart Cath: Normal left main coronary artery. Mild disease in the left anterior descending artery and its branches.Minimal disease in the left circumflex artery and its branches. Occluded proximal right coronary artery with left to right collaterals. The proximal right coronary artery was successfully treated with a 2.75 x 23mm Xience drug-eluting stent, postdilated to 3.1 mm in diameter. An Amplatz 1 guide catheter was needed to reach the ostium of the RCA. There is significant pseudo-lesion noted  when the wire was in place, but this resolved with wire removal. Overall normal left ventricular systolic function. LVEDP 20 mmHg. Ejection fraction 50 %.  Echo: 7/17: Left ventricle: The cavity size was normal. Wall thickness was normal. Systolic function was vigorous. The estimated ejection fraction was in the range of 65% to 70%. Wall motion was normal; there were no regional wall motion abnormalities. Left ventricular diastolic function parameters were normal.   Joanna Puffrystal S Dorsey, MD 06/16/2014, 8:27 AM PGY-1, [facility] FPTS Intern pager: [phone], text pages welcome

## 2014-06-16 NOTE — Progress Notes (Signed)
Subjective: Patient mumbles some  Denies SOB  No signif CP   Objective: Filed Vitals:   06/15/14 1925 06/15/14 2106 06/16/14 0017 06/16/14 0404  BP: 108/60 109/61 94/49 105/58  Pulse: 78 75 72 78  Temp: 98.1 F (36.7 C)  98.3 F (36.8 C) 97.9 F (36.6 C)  TempSrc: Oral  Oral Oral  Resp: 17  15 16   Height:      Weight:    206 lb 12.7 oz (93.8 kg)  SpO2: 94%  95% 98%   Weight change: 11 lb 12.7 oz (5.348 kg)  Intake/Output Summary (Last 24 hours) at 06/16/14 0825 Last data filed at 06/16/14 0300  Gross per 24 hour  Intake    462 ml  Output    950 ml  Net   -488 ml    General: Alert, awake, oriented x3, in no acute distress Neck:  JVP is normal Heart: Regular rate and rhythm, without murmurs, rubs, gallops.  Lungs: Clear to auscultation.  No rales or wheezes. Exemities:  No edema.   Neuro: Grossly intact, nonfocal.  TEle:  SR Lab Results: Results for orders placed during the hospital encounter of 06/14/14 (from the past 24 hour(s))  GLUCOSE, CAPILLARY     Status: Abnormal   Collection Time    06/15/14 11:51 AM      Result Value Ref Range   Glucose-Capillary 138 (*) 70 - 99 mg/dL  GLUCOSE, CAPILLARY     Status: Abnormal   Collection Time    06/15/14  4:26 PM      Result Value Ref Range   Glucose-Capillary 193 (*) 70 - 99 mg/dL  BASIC METABOLIC PANEL     Status: Abnormal   Collection Time    06/15/14  4:28 PM      Result Value Ref Range   Sodium 136 (*) 137 - 147 mEq/L   Potassium 4.4  3.7 - 5.3 mEq/L   Chloride 101  96 - 112 mEq/L   CO2 23  19 - 32 mEq/L   Glucose, Bld 214 (*) 70 - 99 mg/dL   BUN 13  6 - 23 mg/dL   Creatinine, Ser 3.66  0.50 - 1.35 mg/dL   Calcium 8.3 (*) 8.4 - 10.5 mg/dL   GFR calc non Af Amer >90  >90 mL/min   GFR calc Af Amer >90  >90 mL/min   Anion gap 12  5 - 15  GLUCOSE, CAPILLARY     Status: Abnormal   Collection Time    06/15/14  7:24 PM      Result Value Ref Range   Glucose-Capillary 219 (*) 70 - 99 mg/dL   Comment 1 Documented  in Chart     Comment 2 Notify RN    GLUCOSE, CAPILLARY     Status: Abnormal   Collection Time    06/16/14 12:16 AM      Result Value Ref Range   Glucose-Capillary 245 (*) 70 - 99 mg/dL   Comment 1 Documented in Chart     Comment 2 Notify RN    BASIC METABOLIC PANEL     Status: Abnormal   Collection Time    06/16/14  3:20 AM      Result Value Ref Range   Sodium 139  137 - 147 mEq/L   Potassium 3.8  3.7 - 5.3 mEq/L   Chloride 102  96 - 112 mEq/L   CO2 23  19 - 32 mEq/L   Glucose, Bld 183 (*) 70 - 99 mg/dL  BUN 11  6 - 23 mg/dL   Creatinine, Ser 1.610.75  0.50 - 1.35 mg/dL   Calcium 8.2 (*) 8.4 - 10.5 mg/dL   GFR calc non Af Amer >90  >90 mL/min   GFR calc Af Amer >90  >90 mL/min   Anion gap 14  5 - 15  CBC     Status: None   Collection Time    06/16/14  3:20 AM      Result Value Ref Range   WBC 5.1  4.0 - 10.5 K/uL   RBC 4.46  4.22 - 5.81 MIL/uL   Hemoglobin 13.5  13.0 - 17.0 g/dL   HCT 09.639.4  04.539.0 - 40.952.0 %   MCV 88.3  78.0 - 100.0 fL   MCH 30.3  26.0 - 34.0 pg   MCHC 34.3  30.0 - 36.0 g/dL   RDW 81.112.5  91.411.5 - 78.215.5 %   Platelets 207  150 - 400 K/uL    Studies/Results: Echo 7/17:  ------------------------------------------------------------------- LV EF: 65% - 70%  ------------------------------------------------------------------- Indications: MI - acute 410.91.  ------------------------------------------------------------------- History: Risk factors: NSTEMI. GERD. Nausea and vomiting. Diabetes mellitus.  ------------------------------------------------------------------- Study Conclusions  - Left ventricle: The cavity size was normal. Wall thickness was normal. Systolic function was vigorous. The estimated ejection fraction was in the range of 65% to 70%. Wall motion was normal; there were no regional wall motion abnormalities. Left ventricular diastolic function parameters were normal. ------------------------------------------------------------------- Left  ventricle: The cavity size was normal. Wall thickness was normal. Systolic function was vigorous. The estimated ejection fraction was in the range of 65% to 70%. Wall motion was normal; there were no regional wall motion abnormalities. The transmitral flow pattern was normal. The deceleration time of the early transmitral flow velocity was normal. The pulmonary vein flow pattern was normal. The tissue Doppler parameters were normal. Left ventricular diastolic function parameters were normal.  ------------------------------------------------------------------- Aortic valve: Trileaflet; normal thickness leaflets. Mobility was not restricted. Doppler: Transvalvular velocity was within the normal range. There was no stenosis. There was no regurgitation.  ------------------------------------------------------------------- Aorta: Aortic root: The aortic root was normal in size.  ------------------------------------------------------------------- Mitral valve: Structurally normal valve. Mobility was not restricted. Doppler: Transvalvular velocity was within the normal range. There was no evidence for stenosis. There was no regurgitation.  ------------------------------------------------------------------- Left atrium: The atrium was normal in size.  ------------------------------------------------------------------- Right ventricle: The cavity size was normal. Wall thickness was normal. Systolic function was normal.  ------------------------------------------------------------------- Pulmonic valve: Structurally normal valve. Cusp separation was normal. Doppler: Transvalvular velocity was within the normal range. There was no evidence for stenosis. There was no regurgitation.  ------------------------------------------------------------------- Tricuspid valve: Structurally normal valve. Doppler: Transvalvular velocity was within the normal range. There was trivial  regurgitation.  ------------------------------------------------------------------- Pulmonary artery: The main pulmonary artery was normal-sized. Systolic pressure was within the normal range.  ------------------------------------------------------------------- Right atrium: The atrium was normal in size.  ------------------------------------------------------------------- Pericardium: There was no pericardial effusion.  ------------------------------------------------------------------- Systemic veins: Inferior vena cava: The vessel was mildly dilated. The respirophasic diameter changes were in the normal range (>= 50%), consistent with normal central venous pressure.  ------------------------------------------------------------------- Prepared and Electronically Authenticated by  Donato SchultzMark Skains, M.D. 2015-07-17T11:27:40       Medications: Reviewed   @PROBHOSP @  1.  CAD  S/p NSTEMI    LAD with 25% stenosis; Collaterals for RCA;  LCX arge  RCA dominant proximally   Underewnt PTCA/Xience stent to RCA.  Continue prasugrel and asa On lopressor  Will follow BP  Add low dose ACE today  2.5 Lisinopril    2.  DM  Poorly controlled   APpreciate input from  [facility] service  3.  HL  Statin    4.  GERD  Will start omeprazole given history.    5  Neuropathy      LOS: 2 days   Dietrich Pates 06/16/2014, 8:25 AM

## 2014-06-16 NOTE — Progress Notes (Signed)
CARDIAC REHAB PHASE I   PRE:  Rate/Rhythm: 75 SR    BP: sitting 100/64    SaO2:   MODE:  Ambulation: 450 ft   POST:  Rate/Rhythm: 87 SR    BP: sitting 90/60     SaO2:   Tolerated well, no c/o. BP low, denied dizziness. Discussed ex gl and NTG. Pt voiced understanding however cannot assure compliance.  [id number]   Elissa Lovett [place] CES, ACSM 06/16/2014 3:57 PM

## 2014-06-17 DIAGNOSIS — R7309 Other abnormal glucose: Secondary | ICD-10-CM

## 2014-06-17 LAB — CBC
HCT: 37.2 % — ABNORMAL LOW (ref 39.0–52.0)
Hemoglobin: 12.6 g/dL — ABNORMAL LOW (ref 13.0–17.0)
MCH: 29.4 pg (ref 26.0–34.0)
MCHC: 33.9 g/dL (ref 30.0–36.0)
MCV: 86.9 fL (ref 78.0–100.0)
Platelets: 230 10*3/uL (ref 150–400)
RBC: 4.28 MIL/uL (ref 4.22–5.81)
RDW: 12.6 % (ref 11.5–15.5)
WBC: 5.3 10*3/uL (ref 4.0–10.5)

## 2014-06-17 LAB — GLUCOSE, CAPILLARY
GLUCOSE-CAPILLARY: 219 mg/dL — AB (ref 70–99)
GLUCOSE-CAPILLARY: 273 mg/dL — AB (ref 70–99)
GLUCOSE-CAPILLARY: 209 mg/dL — AB (ref 70–99)
Glucose-Capillary: 161 mg/dL — ABNORMAL HIGH (ref 70–99)

## 2014-06-17 LAB — TROPONIN I
Troponin I: 1.12 ng/mL (ref <0.30)
Troponin I: 0.82 ng/mL (ref <0.30)
Troponin I: 0.72 ng/mL (ref <0.30)

## 2014-06-17 MED ORDER — INSULIN ASPART 100 UNIT/ML ~~LOC~~ SOLN
0.0000 [IU] | Freq: Three times a day (TID) | SUBCUTANEOUS | Status: DC
  Administered 2014-06-17: 2 [IU] via SUBCUTANEOUS

## 2014-06-17 NOTE — Consult Note (Signed)
Family Medicine Teaching Service Attending Note  On 7/18 I interviewed and examined patient [name] [name] and reviewed their tests and x-rays.  I discussed with Dr. Leonides Schanz and reviewed their note for today.  I agree with their assessment and plan.

## 2014-06-17 NOTE — Consult Note (Signed)
Family Medicine Teaching Service Daily Progress Note Intern Pager: [phone]  Patient name: [name] [name] Medical record number: [id number] Date of birth: 10-06-1956 Age: 58 y.o. Gender: male  Primary Care Provider: Aleph Fenton, MD  Consultation  Note  Pt Overview and Major Events to Date:  7/16: Pt presented with NSTEMI, Heart Cath 7/17: FMTS consulted for DM management   Assessment and Plan:  This is a 58 y/o male presenting with a NSTEMI with a PMH of uncontrolled DM, HLD, GERD, Barrett's esophagus, and peripheral neuropathy. Family Practice Teaching Service was consulted for DM management. Repeat HbA1c 11.0, fasting lipid panel from 06/14/14 10 year risk is 11.3% with a lifetime risk of 69% without calculating in that he was previously a smoker (quit approximately 15 years ago).  - Blood glucose O/N: 54-290 (since yesterday PM all sugars 161 and less) - Resistant SSI: has required 15 units in the last 24hrs. - Hypoglycemic event (CBG 54) - suspect this was related to not eating dinner, and overlap period of Lantus dosing - got 25 units Lantus at 4:30pm on 7/17 followed by 25 units Lantus at 9am on 7/18 - Continue Lantus 25 units daily, d/c sliding scale insulin (as will likely not continue SSI on discharge) - If sugars uncontrolled without SSI will consider adding back sensitive SSI - Continue to monitor CBGs to determine appropriate discharge regimen. - Low dose ACE inhibitor started - Recommend outpt follow up for diabetic eye exam and foot exam  - On discharge, pt requests Rx for Lantus, Novolog, and insulin pen needles - Has follow up appt scheduled at [facility]  Other problems (NSTEMI, etc) per cardiology.  Subjective:  Had chest pain overnight. Not going home today, getting enzymes recycled. Did not eat dinner last night leading to hypoglycemic event.    Objective: Temp:  [97.8 F (36.6 C)-98.7 F (37.1 C)] 98.2 F (36.8 C) (07/19 0733) Pulse Rate:   [77-87] 81 (07/19 0733) Resp:  [16-18] 16 (07/19 0733) BP: (110-129)/(69-79) 118/72 mmHg (07/19 0733) SpO2:  [98 %-100 %] 98 % (07/19 0733) Weight:  [200 lb (90.719 kg)-202 lb 13.1 oz (91.997 kg)] 202 lb 13.1 oz (91.997 kg) (07/19 0500) Physical Exam: General: NAD  HEENT: Atraumatic, normocephalic Cardiovascular: Regular rate and rhythm, no murmurs rubs or gallops Respiratory: Clear to auscultation bilaterally, no wheezing, crackles, or crackles noted. normal effort  Abdomen: Soft, nondistended  Extremities: No lower extremity edema or calf tenderness noted. No gross deformities. Skin: No rash or cyanosis  Neuro: awake, alert, normal speech, no focal deficits   Laboratory:  Recent Labs Lab 06/15/14 0329 06/16/14 0320 06/17/14 0437  WBC 6.1 5.1 5.3  HGB 12.4* 13.5 12.6*  HCT 37.0* 39.4 37.2*  PLT 210 207 230    Recent Labs Lab 06/14/14 0748 06/15/14 0329 06/15/14 1628 06/16/14 0320  NA 135* 134* 136* 139  K 4.2 4.8 4.4 3.8  CL 99 99 101 102  CO2 21 20 23 23   BUN 11 13 13 11   CREATININE 0.92 0.89 0.80 0.75  CALCIUM 9.4 7.9* 8.3* 8.2*  PROT 6.7  --   --   --   BILITOT <0.2*  --   --   --   ALKPHOS 110  --   --   --   ALT 8  --   --   --   AST 28  --   --   --   GLUCOSE 337* 385* 214* 183*   Risk Stratification Labs  TSH  Component Value Date/Time   TSH 1.890 06/14/2014 1240   Hemoglobin A1C    Component Value Date/Time   HGBA1C 11.0* 06/14/2014 1240   HGBA1C >14.0 04/06/2013 1345   Lipid Panel     Component Value Date/Time   CHOL 163 06/15/2014 0329   TRIG 172* 06/15/2014 0329   HDL 41 06/15/2014 0329   CHOLHDL 4.0 06/15/2014 0329   VLDL 34 06/15/2014 0329   LDLCALC 88 06/15/2014 0329     Imaging/Diagnostic Tests:  Left Heart Cath: Normal left main coronary artery. Mild disease in the left anterior descending artery and its branches.Minimal disease in the left circumflex artery and its branches. Occluded proximal right coronary artery with left to  right collaterals. The proximal right coronary artery was successfully treated with a 2.75 x 23mm Xience drug-eluting stent, postdilated to 3.1 mm in diameter. An Amplatz 1 guide catheter was needed to reach the ostium of the RCA. There is significant pseudo-lesion noted when the wire was in place, but this resolved with wire removal. Overall normal left ventricular systolic function. LVEDP 20 mmHg. Ejection fraction 50 %.  Echo: 7/17: Left ventricle: The cavity size was normal. Wall thickness was normal. Systolic function was vigorous. The estimated ejection fraction was in the range of 65% to 70%. Wall motion was normal; there were no regional wall motion abnormalities. Left ventricular diastolic function parameters were normal.   Latrelle DodrillBrittany J Taris Galindo, MD 06/17/2014, 9:17 AM PGY-3, [place] [facility] FPTS Intern pager: [phone], text pages welcome

## 2014-06-17 NOTE — Progress Notes (Addendum)
Hypoglycemic Event  CBG: 54  Treatment: 15 GM carbohydrate snack  Symptoms: Pale and Shaky  Follow-up CBG: Time:2015 CBG Result:109  Possible Reasons for Event: Inadequate meal intake  Comments/MD notified:no--pt did not eat dinner--had ordered a salad and had not arrived    [name] [name]  Remember to initiate Hypoglycemia Order Set & complete

## 2014-06-17 NOTE — Progress Notes (Signed)
SUBJECTIVE: complains of CP since last night with left arm tingling  OBJECTIVE:   Vitals:   Filed Vitals:   06/16/14 0900 06/16/14 1140 06/16/14 2058 06/17/14 0500  BP: 118/73 124/79 129/74 110/69  Pulse: 78 77 87 86  Temp: 97.2 F (36.2 C) 98 F (36.7 C) 97.8 F (36.6 C) 98.7 F (37.1 C)  TempSrc: Oral Oral Oral Oral  Resp: 18 18 18 18   Height:  5\' 9"  (1.753 m)    Weight:  200 lb (90.719 kg)  202 lb 13.1 oz (91.997 kg)  SpO2: 98% 100% 99% 99%   I&O's:   Intake/Output Summary (Last 24 hours) at 06/17/14 0724 Last data filed at 06/17/14 0500  Gross per 24 hour  Intake   1140 ml  Output      0 ml  Net   1140 ml   TELEMETRY: Reviewed telemetry pt in NSR:     PHYSICAL EXAM General: Well developed, well nourished, in no acute distress Head: Eyes PERRLA, No xanthomas.   Normal cephalic and atramatic  Lungs:   Clear bilaterally to auscultation and percussion. Heart:   HRRR S1 S2 Pulses are 2+ & equal. Abdomen: Bowel sounds are positive, abdomen soft and non-tender without masses Extremities:   No clubbing, cyanosis or edema.  DP +1 Neuro: Alert and oriented X 3. Psych:  Good affect, responds appropriately   LABS: Basic Metabolic Panel:  Recent Labs  97/67/34 1628 06/16/14 0320  NA 136* 139  K 4.4 3.8  CL 101 102  CO2 23 23  GLUCOSE 214* 183*  BUN 13 11  CREATININE 0.80 0.75  CALCIUM 8.3* 8.2*   Liver Function Tests:  Recent Labs  06/14/14 0748  AST 28  ALT 8  ALKPHOS 110  BILITOT <0.2*  PROT 6.7  ALBUMIN 3.5   No results found for this basename: LIPASE, AMYLASE,  in the last 72 hours CBC:  Recent Labs  06/14/14 0748  06/16/14 0320 06/17/14 0437  WBC 7.7  < > 5.1 5.3  NEUTROABS 5.1  --   --   --   HGB 14.9  < > 13.5 12.6*  HCT 42.6  < > 39.4 37.2*  MCV 86.9  < > 88.3 86.9  PLT 241  < > 207 230  < > = values in this interval not displayed. Cardiac Enzymes:  Recent Labs  06/14/14 1240 06/14/14 1835 06/14/14 2330  TROPONINI 2.50*  3.15* 7.27*   BNP: No components found with this basename: POCBNP,  D-Dimer: No results found for this basename: DDIMER,  in the last 72 hours Hemoglobin A1C:  Recent Labs  06/14/14 1240  HGBA1C 11.0*   Fasting Lipid Panel:  Recent Labs  06/15/14 0329  CHOL 163  HDL 41  LDLCALC 88  TRIG 172*  CHOLHDL 4.0   Thyroid Function Tests:  Recent Labs  06/14/14 1240  TSH 1.890   Anemia Panel: No results found for this basename: VITAMINB12, FOLATE, FERRITIN, TIBC, IRON, RETICCTPCT,  in the last 72 hours Coag Panel:   Lab Results  Component Value Date   INR 1.04 06/14/2014   INR 1.07 05/10/2011    RADIOLOGY: Dg Chest 2 View  06/14/2014   CLINICAL DATA:  Chest pain.  EXAM: CHEST  2 VIEW  COMPARISON:  July 21, 2013.  FINDINGS: The heart size and mediastinal contours are within normal limits. Both lungs are clear. No pneumothorax or pleural effusion is noted. The visualized skeletal structures are unremarkable.  IMPRESSION: No acute cardiopulmonary abnormality  seen.   Electronically Signed   By: Roque LiasJames  Green M.D.   On: 06/14/2014 10:26      ASSESSMENT/PLAN: 1. CAD S/p NSTEMI LAD with 25% stenosis;LCX large with minimal disease, occluded RCA dominant proximally s/p PTCA/Xience stent to RCA.  He states he has been having CP since last night with left arm tingling.  Will get an EKG and cycle enzymes.  Will give NTG SL. - Continue prasugrel/asa/BB  2. DM Poorly controlled apppreciate input from FM service  3. HL Statin  4. GERD -continue omeprazole   5 Neuropathy    Quintella ReichertURNER,TRACI R, MD  06/17/2014  7:24 AM

## 2014-06-17 NOTE — Progress Notes (Signed)
C/o chest pressure to Dr. Mayford Knife. Vital signs stable, EKG performed. New orders for cardiac enzymes to be carried out.

## 2014-06-17 NOTE — Consult Note (Signed)
Family Medicine Teaching Service Attending Note  I interviewed and examined patient [name] [name] and reviewed their tests and x-rays.  I discussed with Dr. Pollie Meyer and reviewed their note for today.  I agree with their assessment and plan.     Additionally  Had low blood sugar last night - did not eat after got meal coverage and lantus was stacked with less than 24 hrs between  Continue lantus q 24 hours Consider no sliding scale Work to achieve tighter control once home and back to his regular environment

## 2014-06-18 ENCOUNTER — Encounter (HOSPITAL_COMMUNITY): Payer: Self-pay | Admitting: Physician Assistant

## 2014-06-18 DIAGNOSIS — Z9861 Coronary angioplasty status: Secondary | ICD-10-CM

## 2014-06-18 DIAGNOSIS — E785 Hyperlipidemia, unspecified: Secondary | ICD-10-CM

## 2014-06-18 DIAGNOSIS — I2510 Atherosclerotic heart disease of native coronary artery without angina pectoris: Secondary | ICD-10-CM

## 2014-06-18 DIAGNOSIS — I200 Unstable angina: Secondary | ICD-10-CM

## 2014-06-18 LAB — CBC
HCT: 37.4 % — ABNORMAL LOW (ref 39.0–52.0)
Hemoglobin: 13.1 g/dL (ref 13.0–17.0)
MCH: 30.5 pg (ref 26.0–34.0)
MCHC: 35 g/dL (ref 30.0–36.0)
MCV: 87 fL (ref 78.0–100.0)
PLATELETS: 234 10*3/uL (ref 150–400)
RBC: 4.3 MIL/uL (ref 4.22–5.81)
RDW: 12.6 % (ref 11.5–15.5)
WBC: 4.4 10*3/uL (ref 4.0–10.5)

## 2014-06-18 LAB — GLUCOSE, CAPILLARY
Glucose-Capillary: 81 mg/dL (ref 70–99)
Glucose-Capillary: 146 mg/dL — ABNORMAL HIGH (ref 70–99)

## 2014-06-18 MED ORDER — METFORMIN HCL 500 MG PO TABS: ORAL_TABLET | ORAL | Status: DC

## 2014-06-18 MED ORDER — INSULIN GLARGINE 100 UNIT/ML ~~LOC~~ SOLN: 25.0000 [IU] | Freq: Every day | SUBCUTANEOUS | Status: DC

## 2014-06-18 MED ORDER — ATORVASTATIN CALCIUM 40 MG PO TABS: 40.0000 mg | ORAL_TABLET | Freq: Every evening | ORAL | Status: DC

## 2014-06-18 MED ORDER — NITROGLYCERIN 0.4 MG SL SUBL: 0.4000 mg | SUBLINGUAL_TABLET | SUBLINGUAL | Status: DC | PRN

## 2014-06-18 MED ORDER — METOPROLOL TARTRATE 25 MG PO TABS: 12.5000 mg | ORAL_TABLET | Freq: Two times a day (BID) | ORAL | Status: DC

## 2014-06-18 MED ORDER — PRASUGREL HCL 10 MG PO TABS: 10.0000 mg | ORAL_TABLET | Freq: Every day | ORAL | Status: DC

## 2014-06-18 MED ORDER — PANTOPRAZOLE SODIUM 40 MG PO TBEC: 40.0000 mg | DELAYED_RELEASE_TABLET | Freq: Every day | ORAL | Status: DC

## 2014-06-18 MED ORDER — ASPIRIN 81 MG PO TBEC: 81.0000 mg | DELAYED_RELEASE_TABLET | Freq: Every day | ORAL | Status: DC

## 2014-06-18 NOTE — Consult Note (Signed)
FMTS Attending Daily Note:  Renold Don MD  [phone] pager  Family Practice pager:  [phone] I have discussed this patient with the resident Dr. Leonides Schanz.  I agree with their findings, assessment, and care plan

## 2014-06-18 NOTE — Discharge Summary (Signed)
Discharge Summary   Patient ID: [name] [name] MRN: [id number], DOB/AGE: 1956/04/26 58 y.o. Admit date: 06/14/2014 D/C date:     06/18/2014  Primary Care Provider: Kenn File, MD Primary Cardiologist: Irish Lack  Primary Discharge Diagnoses:  1. CAD with NSTEMI  - cath this admission: occluded RCA dominant proximal s/p asp-thrombectomy/DES to RCA, minimal LAD/LCx, EF 50% by cath, 65-70% by echo  2. Diabetes mellitus uncontrolled A1c 11 c/b neuropathy  3. Hyperlipidemia  4. GERD/Barrett's esophagus  Secondary Discharge Diagnoses:  1. Former tobacco abuse 2. H/o HTN 3. Depression 4. Hx of hemorrhoids 5. Chronic shoulder pain  Hospital Course: [name] is a 58 y/o male with a history of uncontrolled DM, HLD, GERD, barrett's esophagus (no problems in the last seven years), peripheral neuropathy who presented to [facility]14/2014 with chest pain and NSTEMI. He reportedly had a heart cath about ten years ago in [place], results unknown. He presented with several day history of chest pain, described as pressure, 6-7/10 and worst and currently 2-3/10. + Nausea, dizziness, diaphoresis, SOB, orthopnea, radiation to wrists. + abd pain but is chronic for him. He took the hydrocodone he uses for shoulder pain and it helped. Due to symptoms he presented to the ED where initial POC troponin was 1.74. EKG had subtle changes but Dr. Irish Lack did not think it met criteria for a STEMI. CXR was nonacute. CBG was markedly elevated and A1c was 11, indicating uncontrolled DM thus family medicine was asked to assist. Troponin peaked at 7.27. He underwent cath showing: IMPRESSIONS:  1. Normal left main coronary artery. 2. Mild disease in the left anterior descending artery and its branches. 3. Minimal disease in the left circumflex artery and its branches. 4. Occluded proximal right coronary artery with left to right collaterals. The proximal right coronary artery was successfully treated with a 2.75  x 47m Xience drug-eluting stent, postdilated to 3.1 mm in diameter. An Amplatz 1 guide catheter was needed to reach the ostium of the RCA. There is significant pseudo-lesion noted when the wire was in place, but this resolved with wire removal. 5. Overall normal left ventricular systolic function. LVEDP 20 mmHg. Ejection fraction 50 %. He was started on ASA, statin, low dose BB, statin - med titration was prohibited by soft BPs (including no ACEI yet). 2D Echo 06/15/14 - EF 607-12% normal diastolic parameters, no RWMA. He tolerated cath well and activity was progressed. On 06/17/14 he had recurrence of CP but EKG had improved and troponins continued to trend down. Today he is feeling better. He does not work, but excuse notes were given for a court appearance and 1 week to return to class. Dr. JMartiniquehas seen and examined the patient today and feels he is stable for discharge.  Due to statin initiation, consider f/u lipids/LFTs in 6-8 weeks.  Per discussion with family medicine, they recommend to send him home on Lantus 25 units daily flexpen, Metformin 5028mBID -> increase to 100025mID in 1 week. They have scheduled him for a f/u on 7/27. The importance of diabetic control was emphasized. Further refills on his diabetic medicine should come from his primary doctor.  Discharge Vitals: Blood pressure 107/59, pulse 74, temperature 98.2 F (36.8 C), temperature source Oral, resp. rate 18, height _0  (1.753 m), weight 197 lb 1.5 oz (89.4 kg), SpO2 97.00%.  Labs: Lab Results  Component Value Date   WBC 4.4 06/18/2014   HGB 13.1 06/18/2014   HCT 37.4* 06/18/2014  MCV 87.0 06/18/2014   PLT 234 06/18/2014    Recent Labs Lab 06/14/14 0748  06/16/14 0320  NA 135*  < > 139  K 4.2  < > 3.8  CL 99  < > 102  CO2 21  < > 23  BUN 11  < > 11  CREATININE 0.92  < > 0.75  CALCIUM 9.4  < > 8.2*  PROT 6.7  --   --   BILITOT <0.2*  --   --   ALKPHOS 110  --   --   ALT 8  --   --   AST 28  --   --     GLUCOSE 337*  < > 183*  < > = values in this interval not displayed.  Recent Labs  06/17/14 0806 06/17/14 1325 06/17/14 1929  TROPONINI 1.12* 0.82* 0.72*   Lab Results  Component Value Date   CHOL 163 06/15/2014   HDL 41 06/15/2014   LDLCALC 88 06/15/2014   TRIG 172* 06/15/2014    Diagnostic Studies/Procedures   Dg Chest 2 View 06/14/2014   CLINICAL DATA:  Chest pain.  EXAM: CHEST  2 VIEW  COMPARISON:  July 21, 2013.  FINDINGS: The heart size and mediastinal contours are within normal limits. Both lungs are clear. No pneumothorax or pleural effusion is noted. The visualized skeletal structures are unremarkable.  IMPRESSION: No acute cardiopulmonary abnormality seen.   Electronically Signed   By: Sabino Dick M.D.   On: 06/14/2014 10:26   2D echo 06/15/14 - Left ventricle: The cavity size was normal. Wall thickness was normal. Systolic function was vigorous. The estimated ejection fraction was in the range of 65% to 70%. Wall motion was normal; there were no regional wall motion abnormalities. Left ventricular diastolic function parameters were normal.  Cath 06/14/14 PROCEDURE: Left heart catheterization with selective coronary angiography, left ventriculogram. Aspiration thrombectomy of the RCA. PCI of the RCA  INDICATIONS: Non-ST elevation MI  The risks, benefits, and details of the procedure were explained to the patient. The patient verbalized understanding and wanted to proceed. Informed written consent was obtained.  PROCEDURE TECHNIQUE: After Xylocaine anesthesia a 4F slender sheath was placed in the right radial artery with a single anterior needle wall stick. IV heparin was given. Right coronary angiography was done using a Judkins R4 guide catheter. Left coronary angiography was done using an EBU 3.0 guide catheter. The intervention was performed. Please see below for details. Left ventriculography was done using a pigtail catheter. A TR band was used for hemostasis.  CONTRAST:  Total of 175 cc.  COMPLICATIONS: None.  HEMODYNAMICS: Aortic pressure was 94/61; LV pressure was 91/17; LVEDP 20. There was no gradient between the left ventricle and aorta.  ANGIOGRAPHIC DATA: The left main coronary artery is a short vessel, but widely patent.  The left anterior descending artery is a large vessel. There is mild atherosclerosis proximally. There is a large first diagonal. After the origin of the diagonal in the mid LAD, there is a 25%, eccentric stenosis. The mid to distal LAD is widely patent. There are collaterals from the septal vessels to the distal RCA.  The left circumflex artery is a large vessel. The first obtuse marginal is small but patent. The second obtuse marginal is a large vessel which branches across the lateral wall. It appears angiographic normal. The remainder of the circumflex is medium size. There are several terminal obtuse marginal vessels which are patent.  The right coronary artery is a  large, dominant vessel. The vessel has an anterior takeoff and requires an Amplatz guide to reach. The vessel was occluded proximally. After the intervention, it was noted that there is a shepherd's crux proximally. There is tortuosity in the distal vessel as well. Posterior descending artery is medium size. The posterior lateral artery a small. There is only minimal atherosclerosis of the RCA system outside of the stented segment.  LEFT VENTRICULOGRAM: Left ventricular angiogram was done in the 30 RAO projection and revealed mild basal inferior hypokinesis with overall normal systolic function with an estimated ejection fraction of 50 %. LVEDP was 20 mmHg.  PCI NARRATIVE: Initially, a Williams right guide catheter was used. This was unsuccessful at reaching the ostium of the RCA. An AL-1 catheter was then successfully used to engage the RCA. A pro-water wire was placed across the occlusion in the proximal vessel. A priority 1 aspiration catheter was advanced for aspiration  thrombectomy. There was improvement of flow after aspiration thrombectomy. A 2.5 x 15 balloon was then used to treat the thrombotic area in the proximal right coronary artery. It was noted that there appeared to be a lesion in the distal RCA. The wire was pulled back and this turned out to be only pseudo-lesion. The diseased area in the proximal RCA was stented with a 2.75 x 23 mm Xience drug-eluting stent. The stent was post dilated with a 3.0 x 15 noncompliant balloon several inflations, to 3.1 mm in diameter. Several doses of intra-coronary nitroglycerin were given during the procedure. The patient tolerated the procedure well.  IMPRESSIONS:  6. Normal left main coronary artery. 7. Mild disease in the left anterior descending artery and its branches. 8. Minimal disease in the left circumflex artery and its branches. 9. Occluded proximal right coronary artery with left to right collaterals. The proximal right coronary artery was successfully treated with a 2.75 x 43m Xience drug-eluting stent, postdilated to 3.1 mm in diameter. An Amplatz 1 guide catheter was needed to reach the ostium of the RCA. There is significant pseudo-lesion noted when the wire was in place, but this resolved with wire removal. 10. Overall normal left ventricular systolic function. LVEDP 20 mmHg. Ejection fraction 50 %. RECOMMENDATION: I stressed the importance of dual antiplatelet therapy for at least a year to the patient. We also talked about diabetes control to help prevent future coronary events. He will be watched in the hospital overnight. His blood pressure is on the low side and may limit initiation of beta blocker and ACE inhibitor. He will need statin therapy. We'll use prasugrel and aspirin.  He'll need significant education about cardiac risk factors. He would benefit from cardiac rehabilitation.       Discharge Medications   Current Discharge Medication List    START taking these medications   Details    aspirin EC 81 MG EC tablet Take 1 tablet (81 mg total) by mouth daily. Qty: 30 tablet, Refills: 11    atorvastatin (LIPITOR) 40 MG tablet Take 1 tablet (40 mg total) by mouth every evening. Qty: 30 tablet, Refills: 6    insulin glargine (LANTUS) 100 UNIT/ML injection Inject 0.25 mLs (25 Units total) into the skin daily. Qty: 10 mL, Refills: 0    metFORMIN (GLUCOPHAGE) 500 MG tablet Take 1 tablet (500 mg) by mouth twice a day for 1 week, then increase to 2 tablets (1000 mg) twice a day. Qty: 100 tablet, Refills: 0    metoprolol tartrate (LOPRESSOR) 25 MG tablet Take 0.5 tablets (12.5  mg total) by mouth 2 (two) times daily. Qty: 60 tablet, Refills: 6    nitroGLYCERIN (NITROSTAT) 0.4 MG SL tablet Place 1 tablet (0.4 mg total) under the tongue every 5 (five) minutes as needed for chest pain (up to 3 doses). Qty: 25 tablet, Refills: 3    pantoprazole (PROTONIX) 40 MG tablet Take 1 tablet (40 mg total) by mouth daily. Qty: 30 tablet, Refills: 1    prasugrel (EFFIENT) 10 MG TABS tablet Take 1 tablet (10 mg total) by mouth daily. Qty: 30 tablet, Refills: 11      CONTINUE these medications which have NOT CHANGED   Details  buPROPion (WELLBUTRIN SR) 150 MG 12 hr tablet Take 150 mg by mouth 2 (two) times daily.    HYDROcodone-acetaminophen (NORCO/VICODIN) 5-325 MG per tablet Take 1 tablet by mouth every 6 (six) hours as needed for moderate pain.    traZODone (DESYREL) 50 MG tablet Take 50 mg by mouth at bedtime as needed for sleep.      STOP taking these medications     insulin NPH-regular Human (NOVOLIN 70/30) (70-30) 100 UNIT/ML injection      meloxicam (MOBIC) 15 MG tablet         Disposition   The patient will be discharged in stable condition to home. Discharge Instructions   Amb Referral to Cardiac Rehabilitation    Complete by:  As directed      Diet - low sodium heart healthy    Complete by:  As directed   Diabetic Diet     Increase activity slowly    Complete by:   As directed   No driving for 1 week. No lifting over 10 lbs for 2 weeks. No sexual activity for 2 weeks. You may return to class on 06/25/14. Keep procedure site clean & dry. If you notice increased pain, swelling, bleeding or pus, call/return!  You may shower, but no soaking baths/hot tubs/pools for 1 week.  Patients taking aspirin and Effient should generally stay away from medicines like Mobic/meloxicam, ibuprofen, Advil, Motrin, naproxen, and Aleve due to risk of stomach bleeding. You may take Tylenol as directed or talk to your primary doctor about alternatives.          Follow-up Information   Follow up with Kenn File, MD On 06/25/2014. (at 9:45am. Please call to cancel this appointment if you cannot make it)    Specialty:  Family Medicine   Contact information:   [place] [id number] [phone]       Follow up with Truitt Merle, NP. ([place] - 07/02/14 at 11:30am)    Specialty:  Nurse Practitioner   Contact information:   [address] [place] [address] [phone]         Duration of Discharge Encounter: Greater than 30 minutes including physician and PA time.  Signed, Melina Copa PA-C 06/18/2014, 9:37 AM

## 2014-06-18 NOTE — Discharge Summary (Signed)
Patient seen and examined and history reviewed. Agree with above findings and plan. See earlier rounding note.  Peter Jordan, MDFACC 06/18/2014 1:16 PM    

## 2014-06-18 NOTE — Consult Note (Signed)
Family Medicine Teaching Service Daily Progress Note Intern Pager: [phone]  Patient name: [name] [name] Medical record number: [id number] Date of birth: 09-13-1956 Age: 58 y.o. Gender: male  Primary Care Provider: Alexi FentonBradshaw, Samuel, MD  Consultation  Note  Pt Overview and Major Events to Date:  7/16: Pt presented with NSTEMI, Heart Cath 7/17: FMTS consulted for DM management  7/19: Complaints of chest pain. Troponin slightly elevated, however down trending  Assessment and Plan:  This is a 58 y/o male presenting with a NSTEMI with a PMH of uncontrolled DM, HLD, GERD, Barrett's esophagus, and peripheral neuropathy. Family Practice Teaching Service was consulted for DM management. Repeat HbA1c 11.0, fasting lipid panel from 06/14/14 10 year risk is 11.3% with a lifetime risk of 69% without calculating in that he was previously a smoker (quit approximately 15 years ago).   Diabetes mellitus, uncontrolled  - Blood glucose O/N: 81-273  - Sensitive SSI: has required 5 units in the last 24hrs. - Hypoglycemic event on 7/18 (CBG 54) - suspect this was related to not eating dinner, and overlap period of Lantus dosing - got 25 units Lantus at 4:30pm on 7/17 followed by 25 units Lantus at 9am on 7/18 - Low dose ACE inhibitor started - Continue Lantus 25 units daily as an outpatient (can be vial or flexpen). Patient will need a Rx for pen needles as well. Consider adding metformin 500mg  BID x 1 week then increasing to 1000mg  BID. Pt noted some GI upset in the past with metformin, however the hope is he can tolerate it if it started low and titrated up.  - At next outpatient appt, consider the addition of Januvia.  - Recommend outpt follow up for diabetic eye exam and foot exam  - [facility] with Dr. Ermalinda MemosBradshaw on 06/25/14 at 9:45AM.   Other problems (NSTEMI, etc) per cardiology.  Subjective:  Had chest pain overnight. Not going home today, getting enzymes  recycled. Did not eat dinner last night leading to hypoglycemic event.    Objective: Temp:  [98.1 F (36.7 C)-98.2 F (36.8 C)] 98.2 F (36.8 C) (07/20 0636) Pulse Rate:  [70-74] 74 (07/20 0636) Resp:  [18] 18 (07/20 0636) BP: (94-107)/(56-61) 107/59 mmHg (07/20 0636) SpO2:  [97 %-99 %] 97 % (07/20 0636) Weight:  [197 lb 1.5 oz (89.4 kg)] 197 lb 1.5 oz (89.4 kg) (07/20 0636) Physical Exam: General: NAD  HEENT: Atraumatic, normocephalic Cardiovascular: Regular rate and rhythm, no murmurs rubs or gallops Respiratory: Clear to auscultation bilaterally, no wheezing, crackles, or crackles noted. normal effort  Abdomen: Soft, nondistended  Extremities: No lower extremity edema or calf tenderness noted. No gross deformities. Skin: No rash or cyanosis  Neuro: awake, alert, normal speech, no focal deficits   Laboratory:  Recent Labs Lab 06/16/14 0320 06/17/14 0437 06/18/14 0445  WBC 5.1 5.3 4.4  HGB 13.5 12.6* 13.1  HCT 39.4 37.2* 37.4*  PLT 207 230 234    Recent Labs Lab 06/14/14 0748 06/15/14 0329 06/15/14 1628 06/16/14 0320  NA 135* 134* 136* 139  K 4.2 4.8 4.4 3.8  CL 99 99 101 102  CO2 21 20 23 23   BUN 11 13 13 11   CREATININE 0.92 0.89 0.80 0.75  CALCIUM 9.4 7.9* 8.3* 8.2*  PROT 6.7  --   --   --   BILITOT <0.2*  --   --   --   ALKPHOS 110  --   --   --  ALT 8  --   --   --   AST 28  --   --   --   GLUCOSE 337* 385* 214* 183*   Risk Stratification Labs  TSH    Component Value Date/Time   TSH 1.890 06/14/2014 1240   Hemoglobin A1C    Component Value Date/Time   HGBA1C 11.0* 06/14/2014 1240   HGBA1C >14.0 04/06/2013 1345   Lipid Panel     Component Value Date/Time   CHOL 163 06/15/2014 0329   TRIG 172* 06/15/2014 0329   HDL 41 06/15/2014 0329   CHOLHDL 4.0 06/15/2014 0329   VLDL 34 06/15/2014 0329   LDLCALC 88 06/15/2014 0329     Imaging/Diagnostic Tests:  Left Heart Cath: Normal left main coronary artery. Mild disease in the left anterior  descending artery and its branches.Minimal disease in the left circumflex artery and its branches. Occluded proximal right coronary artery with left to right collaterals. The proximal right coronary artery was successfully treated with a 2.75 x 40mm Xience drug-eluting stent, postdilated to 3.1 mm in diameter. An Amplatz 1 guide catheter was needed to reach the ostium of the RCA. There is significant pseudo-lesion noted when the wire was in place, but this resolved with wire removal. Overall normal left ventricular systolic function. LVEDP 20 mmHg. Ejection fraction 50 %.  Echo: 7/17: Left ventricle: The cavity size was normal. Wall thickness was normal. Systolic function was vigorous. The estimated ejection fraction was in the range of 65% to 70%. Wall motion was normal; there were no regional wall motion abnormalities. Left ventricular diastolic function parameters were normal.   Joanna Puff, MD 06/18/2014, 8:43 AM PGY-1, [facility] Family Medicine FPTS Intern pager: [phone], text pages welcome

## 2014-06-18 NOTE — Progress Notes (Signed)
Patient: [name] [name] / Admit Date: 06/14/2014 / Date of Encounter: 06/18/2014, 8:19 AM   Subjective: No CP or SOB.   Objective: Telemetry: NSR Physical Exam: Blood pressure 107/59, pulse 74, temperature 98.2 F (36.8 C), temperature source Oral, resp. rate 18, height 5\' 9"  (1.753 m), weight 197 lb 1.5 oz (89.4 kg), SpO2 97.00%. General: Well developed, well nourished AAM in no acute distress. Head: Normocephalic, atraumatic, sclera non-icteric, no xanthomas, nares are without discharge. Neck: Negative for carotid bruits. JVP not elevated. Lungs: Clear bilaterally to auscultation without wheezes, rales, or rhonchi. Breathing is unlabored. Heart: RRR S1 S2 without murmurs, rubs, or gallops.  Abdomen: Soft, non-tender, non-distended with normoactive bowel sounds. No rebound/guarding. Extremities: No clubbing or cyanosis. No edema. Distal pedal pulses are 2+ and equal bilaterally. R radial site without hematoma, ecchymosis, good pulse in tact Neuro: Alert and oriented X 3. Moves all extremities spontaneously. Psych:  Responds to questions appropriately with a normal affect.   Intake/Output Summary (Last 24 hours) at 06/18/14 0819 Last data filed at 06/18/14 0500  Gross per 24 hour  Intake    720 ml  Output      0 ml  Net    720 ml    Inpatient Medications:  . aspirin EC  81 mg Oral Daily  . atorvastatin  40 mg Oral q1800  . buPROPion  150 mg Oral BID  . insulin aspart  0-9 Units Subcutaneous TID WC  . insulin glargine  25 Units Subcutaneous Daily  . metoprolol tartrate  12.5 mg Oral BID  . pantoprazole  40 mg Oral Daily  . prasugrel  10 mg Oral Daily   Infusions:    Labs:  Recent Labs  06/15/14 1628 06/16/14 0320  NA 136* 139  K 4.4 3.8  CL 101 102  CO2 23 23  GLUCOSE 214* 183*  BUN 13 11  CREATININE 0.80 0.75  CALCIUM 8.3* 8.2*   No results found for this basename: AST, ALT, ALKPHOS, BILITOT, PROT, ALBUMIN,  in the last 72 hours  Recent Labs  06/17/14 0437  06/18/14 0445  WBC 5.3 4.4  HGB 12.6* 13.1  HCT 37.2* 37.4*  MCV 86.9 87.0  PLT 230 234    Recent Labs  06/17/14 0806 06/17/14 1325 06/17/14 1929  TROPONINI 1.12* 0.82* 0.72*     Radiology/Studies:  Dg Chest 2 View  06/14/2014   CLINICAL DATA:  Chest pain.  EXAM: CHEST  2 VIEW  COMPARISON:  July 21, 2013.  FINDINGS: The heart size and mediastinal contours are within normal limits. Both lungs are clear. No pneumothorax or pleural effusion is noted. The visualized skeletal structures are unremarkable.  IMPRESSION: No acute cardiopulmonary abnormality seen.   Electronically Signed   By: Roque Lias M.D.   On: 06/14/2014 10:26     Assessment and Plan  1. CAD with NSTEMI with occluded RCA dominant proximall s/p asp-thrombectomy/DES to RCA, minimal LAD/LCx, EF 50% by cath, 65-70% by echo 2. Diabetes mellitus uncontrolled A1c 11 c/b neuropathy 3. Hyperlipidemia 4. GERD/Barrett's esophagus  Recurrent CP yesterday, but EKG did not appear signficantly changed and enzymes continue to trend down. Feels better today. Continue ASA, BB, statin, Effient. BP remains softer prohibiting additional med titration. Excuse notes provided for class and court appearance. He required GERD rx at discharge which we can provide ([facility] [place]). He also requested chronic pain med refill but I asked him to speak with primary physician from which he obtains this medication as we do  not routinely refill narcotics.  Would appreciate final dc recs regarding diabetes meds and followup plan from IM.   Signed, Ronie Spiesayna Dunn PA-C Patient seen and examined and history reviewed. Agree with above findings and plan. Patient is feeling well today. No chest pain with ambulation. Radial site without hematoma. troponins are trending down. Ecg improved with only T wave flattening inferiorly. Plan DC today after recommendations from medical service. Reviewed importance of taking DAPT without interruption. Continue statin and  metoprolol therapy.  Peter SwazilandJordan, MDFACC 06/18/2014 8:37 AM

## 2014-06-25 ENCOUNTER — Inpatient Hospital Stay: Payer: Medicare Other | Admitting: Family Medicine

## 2014-07-02 ENCOUNTER — Encounter: Payer: Self-pay | Admitting: Nurse Practitioner

## 2014-07-02 ENCOUNTER — Ambulatory Visit (INDEPENDENT_AMBULATORY_CARE_PROVIDER_SITE_OTHER): Payer: Commercial Managed Care - HMO | Admitting: Nurse Practitioner

## 2014-07-02 ENCOUNTER — Other Ambulatory Visit: Payer: Self-pay | Admitting: *Deleted

## 2014-07-02 VITALS — BP 110/60 | HR 77 | Ht 69.0 in | Wt 198.1 lb

## 2014-07-02 DIAGNOSIS — I214 Non-ST elevation (NSTEMI) myocardial infarction: Secondary | ICD-10-CM | POA: Diagnosis not present

## 2014-07-02 DIAGNOSIS — E871 Hypo-osmolality and hyponatremia: Secondary | ICD-10-CM

## 2014-07-02 LAB — CBC
HCT: 46.2 % (ref 39.0–52.0)
Hemoglobin: 15.4 g/dL (ref 13.0–17.0)
MCHC: 33.5 g/dL (ref 30.0–36.0)
MCV: 89.3 fl (ref 78.0–100.0)
Platelets: 310 10*3/uL (ref 150.0–400.0)
RBC: 5.17 Mil/uL (ref 4.22–5.81)
RDW: 12.9 % (ref 11.5–15.5)
WBC: 5.5 10*3/uL (ref 4.0–10.5)

## 2014-07-02 LAB — BASIC METABOLIC PANEL
BUN: 17 mg/dL (ref 6–23)
CO2: 22 mEq/L (ref 19–32)
Calcium: 8.9 mg/dL (ref 8.4–10.5)
Chloride: 97 mEq/L (ref 96–112)
Creatinine, Ser: 1.1 mg/dL (ref 0.4–1.5)
GFR: 87.2 mL/min (ref >60.00)
Glucose, Bld: 432 mg/dL — ABNORMAL HIGH (ref 70–99)
Potassium: 3.9 mEq/L (ref 3.5–5.1)
Sodium: 129 mEq/L — ABNORMAL LOW (ref 135–145)

## 2014-07-02 MED ORDER — ATORVASTATIN CALCIUM 40 MG PO TABS: 40.0000 mg | ORAL_TABLET | Freq: Every day | ORAL | Status: DC

## 2014-07-02 MED ORDER — ISOSORBIDE MONONITRATE ER 30 MG PO TB24: 30.0000 mg | ORAL_TABLET | Freq: Every day | ORAL | Status: DC

## 2014-07-02 NOTE — Patient Instructions (Addendum)
We need to check lab today  We will get you a visit with Dr. Lucianne Muss - ASAP - for diabetes  We will refer you to primary care  See Dr. Eldridge Dace in one month  I am adding Imdur 30 mg a day - this is a long acting form of NTG - this is at [facility]  I am adding back the Lipitor - this is at [facility] as well - take one a day  Call the [facility] office at [phone] if you have any questions, problems or concerns.

## 2014-07-02 NOTE — Progress Notes (Signed)
[name] [name] Date of Birth: [birth year] Medical Record #[id number]  History of Present Illness: [name] [name] is seen back today for a post hospital/TOC visit - but discharged on 7/20 and no phone call documented. Seen for Dr. Eldridge Dace. He has uncontrolled DM, HLD, GERD, barrett's esophagus, peripheral neuropathy and past smoker.  Most recently presented with chest pain. EKG with subtle changes. Troponin positive at 7.27. Was cathed and had an occluded RCA dominant proximal lesion - s/p thrombectomy/DES to the RCA. Minimal LAD/LCX disease. EF 50% by cath and 65 to 70% by echo. A1C was 11.  Comes in today. Here with his wife. Doing ok. He is quite vague with his history. Affect pretty flat. Has had some chest pain. Has used NTG sl x 3 occasions. Two of them was last week. May have been similar to what he presented with - not sure. "Forgot" his PCP visit last week - says he wants to see someone else anyway. Not taking glucophage - says that hurt his stomach. Taking his Effient. Not on Lipitor - could not afford. Sugar running in the 500's at home. Says he is taking his Effient. Has just gotten insurance as of August 1st.  Current Outpatient Prescriptions  Medication Sig Dispense Refill  . aspirin EC 81 MG EC tablet Take 1 tablet (81 mg total) by mouth daily.  30 tablet  11  . Insulin Lispro, Human, (HUMALOG ) Inject 70 Units into the skin 2 (two) times daily. 32 units      . insulin regular (NOVOLIN R,HUMULIN R) 100 units/mL injection Inject 10 Units into the skin 2 (two) times daily before a meal.      . metoprolol tartrate (LOPRESSOR) 25 MG tablet Take 0.5 tablets (12.5 mg total) by mouth 2 (two) times daily.  60 tablet  6  . nitroGLYCERIN (NITROSTAT) 0.4 MG SL tablet Place 1 tablet (0.4 mg total) under the tongue every 5 (five) minutes as needed for chest pain (up to 3 doses).  25 tablet  3  . pantoprazole (PROTONIX) 40 MG tablet Take 1 tablet (40 mg total) by mouth daily.  30 tablet  1  .  prasugrel (EFFIENT) 10 MG TABS tablet Take 1 tablet (10 mg total) by mouth daily.  30 tablet  11  . traZODone (DESYREL) 50 MG tablet Take 50 mg by mouth at bedtime as needed for sleep.      Marland Kitchen HYDROcodone-acetaminophen (NORCO/VICODIN) 5-325 MG per tablet Take 1 tablet by mouth every 6 (six) hours as needed for moderate pain.      Marland Kitchen insulin glargine (LANTUS) 100 UNIT/ML injection Inject 0.25 mLs (25 Units total) into the skin daily.  10 mL  0  . isosorbide mononitrate (IMDUR) 30 MG 24 hr tablet Take 1 tablet (30 mg total) by mouth daily.  90 tablet  3  . metFORMIN (GLUCOPHAGE) 500 MG tablet Take 1 tablet (500 mg) by mouth twice a day for 1 week, then increase to 2 tablets (1000 mg) twice a day.  100 tablet  0   No current facility-administered medications for this visit.    No Known Allergies  Past Medical History  Diagnosis Date  . Diabetes type 2, uncontrolled   . Hypercholesterolemia   . GERD (gastroesophageal reflux disease)   . Hypertension   . Hemorrhoids     hx of  . Peripheral neuropathy   . Depression   . CAD (coronary artery disease)     a. NSTEMI 05/2014 - occluded RCA dominant proximal  s/p asp-thrombectomy/DES to RCA, minimal LAD/LCx, EF 50% by cath, 65-70% by echo.  . Barrett's esophagus   . Former tobacco use     Past Surgical History  Procedure Laterality Date  . Tonsillectomy      History  Smoking status  . Former Smoker -- 0.25 packs/day for 3 years  . Quit date: 11/30/1998  Smokeless tobacco  . Never Used    History  Alcohol Use No    Family History  Problem Relation Age of Onset  . Hypertension Mother   . Alzheimer's disease Mother   . Alcohol abuse Father   . Diabetes type II Brother     Review of Systems: The review of systems is per the HPI.  All other systems were reviewed and are negative.  Physical Exam: BP 110/60  Pulse 77  Ht 5\' 9"  (1.753 m)  Wt 198 lb 1.9 oz (89.867 kg)  BMI 29.24 kg/m2  SpO2 100% Patient is in no acute distress.  His affect is quite flat - very depressed in my opinion and not engaged. Skin is warm and dry. Color is normal.  HEENT is unremarkable. Normocephalic/atraumatic. PERRL. Sclera are nonicteric. Neck is supple. No masses. No JVD. Lungs are clear. Cardiac exam shows a regular rate and rhythm. Abdomen is soft. Extremities are without edema. Gait and ROM are intact. No gross neurologic deficits noted.  Wt Readings from Last 3 Encounters:  07/02/14 198 lb 1.9 oz (89.867 kg)  06/18/14 197 lb 1.5 oz (89.4 kg)  06/18/14 197 lb 1.5 oz (89.4 kg)    LABORATORY DATA/PROCEDURES:  Lab Results  Component Value Date   WBC 4.4 06/18/2014   HGB 13.1 06/18/2014   HCT 37.4* 06/18/2014   PLT 234 06/18/2014   GLUCOSE 183* 06/16/2014   CHOL 163 06/15/2014   TRIG 172* 06/15/2014   HDL 41 06/15/2014   LDLCALC 88 06/15/2014   ALT 8 06/14/2014   AST 28 06/14/2014   NA 139 06/16/2014   K 3.8 06/16/2014   CL 102 06/16/2014   CREATININE 0.75 06/16/2014   BUN 11 06/16/2014   CO2 23 06/16/2014   TSH 1.890 06/14/2014   PSA 0.43 09/25/2008   INR 1.04 06/14/2014   HGBA1C 11.0* 06/14/2014   MICROALBUR 0.50 11/12/2010    Lab Results  Component Value Date   CKTOTAL 96 11/18/2011   CKMB 2.4 11/18/2011   TROPONINI 0.72* 06/17/2014     BNP (last 3 results) No results found for this basename: PROBNP,  in the last 8760 hours   Echo Study Conclusions from July 2015  - Left ventricle: The cavity size was normal. Wall thickness was normal. Systolic function was vigorous. The estimated ejection fraction was in the range of 65% to 70%. Wall motion was normal; there were no regional wall motion abnormalities. Left ventricular diastolic function parameters were normal.    PROCEDURE: Left heart catheterization with selective coronary angiography, left ventriculogram. Aspiration thrombectomy of the RCA. PCI of the RCA  INDICATIONS: Non-ST elevation MI  The risks, benefits, and details of the procedure were explained to the  patient. The patient verbalized understanding and wanted to proceed. Informed written consent was obtained.  PROCEDURE TECHNIQUE: After Xylocaine anesthesia a 3F slender sheath was placed in the right radial artery with a single anterior needle wall stick. IV heparin was given. Right coronary angiography was done using a Judkins R4 guide catheter. Left coronary angiography was done using an EBU 3.0 guide catheter. The intervention was performed. Please see  below for details. Left ventriculography was done using a pigtail catheter. A TR band was used for hemostasis.  CONTRAST: Total of 175 cc.  COMPLICATIONS: None.  HEMODYNAMICS: Aortic pressure was 94/61; LV pressure was 91/17; LVEDP 20. There was no gradient between the left ventricle and aorta.  ANGIOGRAPHIC DATA: The left main coronary artery is a short vessel, but widely patent.  The left anterior descending artery is a large vessel. There is mild atherosclerosis proximally. There is a large first diagonal. After the origin of the diagonal in the mid LAD, there is a 25%, eccentric stenosis. The mid to distal LAD is widely patent. There are collaterals from the septal vessels to the distal RCA.  The left circumflex artery is a large vessel. The first obtuse marginal is small but patent. The second obtuse marginal is a large vessel which branches across the lateral wall. It appears angiographic normal. The remainder of the circumflex is medium size. There are several terminal obtuse marginal vessels which are patent.  The right coronary artery is a large, dominant vessel. The vessel has an anterior takeoff and requires an Amplatz guide to reach. The vessel was occluded proximally. After the intervention, it was noted that there is a shepherd's crux proximally. There is tortuosity in the distal vessel as well. Posterior descending artery is medium size. The posterior lateral artery a small. There is only minimal atherosclerosis of the RCA system outside of  the stented segment.  LEFT VENTRICULOGRAM: Left ventricular angiogram was done in the 30 RAO projection and revealed mild basal inferior hypokinesis with overall normal systolic function with an estimated ejection fraction of 50 %. LVEDP was 20 mmHg.  PCI NARRATIVE: Initially, a Williams right guide catheter was used. This was unsuccessful at reaching the ostium of the RCA. An AL-1 catheter was then successfully used to engage the RCA. A pro-water wire was placed across the occlusion in the proximal vessel. A priority 1 aspiration catheter was advanced for aspiration thrombectomy. There was improvement of flow after aspiration thrombectomy. A 2.5 x 15 balloon was then used to treat the thrombotic area in the proximal right coronary artery. It was noted that there appeared to be a lesion in the distal RCA. The wire was pulled back and this turned out to be only pseudo-lesion. The diseased area in the proximal RCA was stented with a 2.75 x 23 mm Xience drug-eluting stent. The stent was post dilated with a 3.0 x 15 noncompliant balloon several inflations, to 3.1 mm in diameter. Several doses of intra-coronary nitroglycerin were given during the procedure. The patient tolerated the procedure well.  IMPRESSIONS:  1. Normal left main coronary artery. 2. Mild disease in the left anterior descending artery and its branches. 3. Minimal disease in the left circumflex artery and its branches. 4. Occluded proximal right coronary artery with left to right collaterals. The proximal right coronary artery was successfully treated with a 2.75 x 23mm Xience drug-eluting stent, postdilated to 3.1 mm in diameter. An Amplatz 1 guide catheter was needed to reach the ostium of the RCA. There is significant pseudo-lesion noted when the wire was in place, but this resolved with wire removal. 5. Overall normal left ventricular systolic function. LVEDP 20 mmHg. Ejection fraction 50 %. RECOMMENDATION: I stressed the importance of  dual antiplatelet therapy for at least a year to the patient. We also talked about diabetes control to help prevent future coronary events. He will be watched in the hospital overnight. His blood pressure is on  the low side and may limit initiation of beta blocker and ACE inhibitor. He will need statin therapy. We'll use prasugrel and aspirin.  He'll need significant education about cardiac risk factors. He would benefit from cardiac rehabilitation.     Assessment / Plan: 1. Inferior MI - s/p thrombectomy and DES to the RCA - minimal disease in the left system -  Recheck lab today. Not ready for cardiac rehab. EKG ok today. Adding Imdur 30 mg for chest pain. History is hard to follow.   2. Uncontrolled DM -  This is the crux of his issues - needs to get to a diabetic doctor. Arrange for PCP as well. Compliance will be an issue in my opinion.   3. HTN -  BP ok on current regimen.   4. HLD - off of his Lipitor - insurance "just kicked in". Will reorder. Check lipids on return.   5. GERD/barrett's esophagus  6. Depression   See back in a month.   Patient is agreeable to this plan and will call if any problems develop in the interim.   Rosalio Macadamia, RN, ANP-C [facility] [address] [place], [place]  [address] [phone]

## 2014-07-09 ENCOUNTER — Other Ambulatory Visit (INDEPENDENT_AMBULATORY_CARE_PROVIDER_SITE_OTHER): Payer: Commercial Managed Care - HMO

## 2014-07-09 DIAGNOSIS — E871 Hypo-osmolality and hyponatremia: Secondary | ICD-10-CM

## 2014-07-09 LAB — BASIC METABOLIC PANEL
BUN: 13 mg/dL (ref 6–23)
CO2: 22 mEq/L (ref 19–32)
Calcium: 8.9 mg/dL (ref 8.4–10.5)
Chloride: 107 mEq/L (ref 96–112)
Creatinine, Ser: 1 mg/dL (ref 0.4–1.5)
GFR: 97.14 mL/min (ref >60.00)
Glucose, Bld: 256 mg/dL — ABNORMAL HIGH (ref 70–99)
Potassium: 4.4 mEq/L (ref 3.5–5.1)
Sodium: 135 mEq/L (ref 135–145)

## 2014-07-10 ENCOUNTER — Other Ambulatory Visit: Payer: Self-pay | Admitting: *Deleted

## 2014-07-10 ENCOUNTER — Encounter: Payer: Commercial Managed Care - HMO | Admitting: Nutrition

## 2014-07-10 ENCOUNTER — Ambulatory Visit (INDEPENDENT_AMBULATORY_CARE_PROVIDER_SITE_OTHER): Payer: Commercial Managed Care - HMO | Admitting: Endocrinology

## 2014-07-10 ENCOUNTER — Encounter: Payer: Self-pay | Admitting: Endocrinology

## 2014-07-10 VITALS — BP 124/71 | HR 85 | Temp 98.3°F | Resp 16 | Ht 69.0 in | Wt 199.6 lb

## 2014-07-10 DIAGNOSIS — F3289 Other specified depressive episodes: Secondary | ICD-10-CM

## 2014-07-10 DIAGNOSIS — I2583 Coronary atherosclerosis due to lipid rich plaque: Secondary | ICD-10-CM

## 2014-07-10 DIAGNOSIS — IMO0001 Reserved for inherently not codable concepts without codable children: Secondary | ICD-10-CM

## 2014-07-10 DIAGNOSIS — F32A Depression, unspecified: Secondary | ICD-10-CM

## 2014-07-10 DIAGNOSIS — F329 Major depressive disorder, single episode, unspecified: Secondary | ICD-10-CM

## 2014-07-10 DIAGNOSIS — E1165 Type 2 diabetes mellitus with hyperglycemia: Secondary | ICD-10-CM

## 2014-07-10 DIAGNOSIS — I2510 Atherosclerotic heart disease of native coronary artery without angina pectoris: Secondary | ICD-10-CM

## 2014-07-10 DIAGNOSIS — IMO0002 Reserved for concepts with insufficient information to code with codable children: Secondary | ICD-10-CM

## 2014-07-10 LAB — GLUCOSE, POCT (MANUAL RESULT ENTRY): POC GLUCOSE: 265 mg/dL — AB (ref 70–99)

## 2014-07-10 MED ORDER — ACCU-CHEK SOFTCLIX LANCETS MISC: Status: DC

## 2014-07-10 MED ORDER — GLUCOSE BLOOD VI STRP: ORAL_STRIP | Status: DC

## 2014-07-10 MED ORDER — INSULIN PEN NEEDLE 31G X 5 MM MISC: Status: DC

## 2014-07-10 MED ORDER — INSULIN ASPART 100 UNIT/ML FLEXPEN: PEN_INJECTOR | SUBCUTANEOUS | Status: DC

## 2014-07-10 MED ORDER — INSULIN GLARGINE 100 UNIT/ML SOLOSTAR PEN: 20.0000 [IU] | PEN_INJECTOR | Freq: Two times a day (BID) | SUBCUTANEOUS | Status: DC

## 2014-07-10 NOTE — Progress Notes (Signed)
Patient ID: [name] [name], male   DOB: August 26, 1956, 58 y.o.   MRN: [id number]           Reason for Appointment: Consultation for Type 2 Diabetes  Referring physician: Norma FredricksonLori Gerhardt  History of Present Illness:          Diagnosis: Type 2 diabetes mellitus, date of diagnosis: 1995        Past history: He thinks he was started on insulin at the time of diagnosis when he was having significantly high readings and had symptoms of dry mouth and nausea. He has  been on various insulin regimens over the years and generally managed by primary care physicians He thinks that initially his blood sugars were relatively easier to control Most likely has had poor control for the last several years Also in 09/2012 he was admitted to the hospital with diabetic ketoacidosis Until last year he was on Lantus and NovoLog but records of his A1c indicates very poor control and 2014  Recent history:  He has not had any followup for his diabetes for about a year Because of the cost of Lantus and NovoLog he has on his own been buying the over-the-counter 70/30 insulin from [facility] He has adjusted the dose on his own and for the last couple months has been taking 32 units twice a day About 2 months ago because of high readings he has also added 10 units of regular insulin with his breakfast and supper doses; occasionally will take this at bedtime also for high readings He has not made any adjustments to his insulin based on what he is eating or his meal size Not keeping a record of his blood sugars However he is checking his blood sugar very sporadically and mostly at bedtime; his blood sugars are at least 200 and as much as 450 at night. Does not do many readings during the day and not clear what his fasting readings are He does not always have nocturia but has some increased thirst His insulin regimen was continued unchanged after his discharge from the hospital last month for his MI       Oral hypoglycemic drugs  the patient is taking are: He has been on metformin on and off and he has not taking this regularly because of diarrhea from it     Side effects from medications have been: Metformin causes diarrhea INSULIN regimen is described as:  32 units 70/30 insulin with 10 units of regular twice a day before meals Compliance with the medical regimen: poor  Hypoglycemia: none; did have an episode in the hospital when he got low and with low sugars will have symptoms of feeling shaky, confused    Glucose monitoring:  sporadically        Glucometer: Accucheck       Blood Glucose readings as above  Glycemic control:    Lab Results  Component Value Date   HGBA1C 11.0* 06/14/2014   HGBA1C 11.9* 07/21/2013   HGBA1C >14.0 04/06/2013   Lab Results  Component Value Date   MICROALBUR 0.50 11/12/2010   LDLCALC 88 06/15/2014   CREATININE 1.0 07/09/2014   Retinal exam: Most recent: 2 years ago  Self-care: The diet that the patient has been following is: None, recently cutting back on fat intake.      Meals: 3 meals per day. Breakfast is egg; lunch sandwich or salad. Eating out about 4 times a week including at fast food restaurants  Exercise: none         Dietician visit: Most recent: 8/15 in hospital.               Weight history: No recent weight loss or gain  Wt Readings from Last 3 Encounters:  07/10/14 199 lb 9.6 oz (90.538 kg)  07/02/14 198 lb 1.9 oz (89.867 kg)  06/18/14 197 lb 1.5 oz (89.4 kg)       Medication List       This list is accurate as of: 07/10/14  9:16 AM.  Always use your most recent med list.               aspirin 81 MG EC tablet  Take 1 tablet (81 mg total) by mouth daily.     atorvastatin 40 MG tablet  Commonly known as:  LIPITOR  Take 1 tablet (40 mg total) by mouth daily.     HUMALOG [place]  Inject 70 Units into the skin 2 (two) times daily. 32 units     HYDROcodone-acetaminophen 5-325 MG per tablet  Commonly known as:  NORCO/VICODIN  Take 1 tablet by mouth  every 6 (six) hours as needed for moderate pain.     insulin glargine 100 UNIT/ML injection  Commonly known as:  LANTUS  Inject 0.25 mLs (25 Units total) into the skin daily.     insulin regular 100 units/mL injection  Commonly known as:  NOVOLIN R,HUMULIN R  Inject 10 Units into the skin 2 (two) times daily before a meal.     isosorbide mononitrate 30 MG 24 hr tablet  Commonly known as:  IMDUR  Take 1 tablet (30 mg total) by mouth daily.     metFORMIN 500 MG tablet  Commonly known as:  GLUCOPHAGE  Take 1 tablet (500 mg) by mouth twice a day for 1 week, then increase to 2 tablets (1000 mg) twice a day.     metoprolol tartrate 25 MG tablet  Commonly known as:  LOPRESSOR  Take 0.5 tablets (12.5 mg total) by mouth 2 (two) times daily.     nitroGLYCERIN 0.4 MG SL tablet  Commonly known as:  NITROSTAT  Place 1 tablet (0.4 mg total) under the tongue every 5 (five) minutes as needed for chest pain (up to 3 doses).     pantoprazole 40 MG tablet  Commonly known as:  PROTONIX  Take 1 tablet (40 mg total) by mouth daily.     prasugrel 10 MG Tabs tablet  Commonly known as:  EFFIENT  Take 1 tablet (10 mg total) by mouth daily.     traZODone 50 MG tablet  Commonly known as:  DESYREL  Take 50 mg by mouth at bedtime as needed for sleep.        Allergies: No Known Allergies  Past Medical History  Diagnosis Date  . Diabetes type 2, uncontrolled   . Hypercholesterolemia   . GERD (gastroesophageal reflux disease)   . Hypertension   . Hemorrhoids     hx of  . Peripheral neuropathy   . Depression   . CAD (coronary artery disease)     a. NSTEMI 05/2014 - occluded RCA dominant proximal s/p asp-thrombectomy/DES to RCA, minimal LAD/LCx, EF 50% by cath, 65-70% by echo.  . Barrett's esophagus   . Former tobacco use     Past Surgical History  Procedure Laterality Date  . Tonsillectomy      Family History  Problem Relation Age of Onset  . Hypertension Mother   .  Alzheimer's  disease Mother   . Alcohol abuse Father   . Diabetes type II Brother     Social History:  reports that he quit smoking about 15 years ago. He has never used smokeless tobacco. He reports that he does not drink alcohol or use illicit drugs.    Review of Systems       Lipids: On treatment on and off for the last 2 years, recently started on Lipitor 40 mg after his MI       Lab Results  Component Value Date   CHOL 163 06/15/2014   HDL 41 06/15/2014   LDLCALC 88 06/15/2014   TRIG 172* 06/15/2014   CHOLHDL 4.0 06/15/2014                  Skin: No rash or infections     Thyroid:  He tends to get tired at times     The blood pressure had been treated with medications about 10 years ago but is not on any medications now including ACE inhibitor     No swelling of feet.     No shortness of breath or chest tightness  on exertion.     Bowel habits: Normal.       He has frequency of urination but not much nocturia       Shoulder and lower asymmetry joint  cause pain at times          He has a long history of mostly tingling in feet; may get sharp pain in hands. He thinks he has carpal tunnel syndrome also    LABS:  Appointment on 07/09/2014  Component Date Value Ref Range Status  . Sodium 07/09/2014 135  135 - 145 mEq/L Final  . Potassium 07/09/2014 4.4  3.5 - 5.1 mEq/L Final  . Chloride 07/09/2014 107  96 - 112 mEq/L Final  . CO2 07/09/2014 22  19 - 32 mEq/L Final  . Glucose, Bld 07/09/2014 256* 70 - 99 mg/dL Final  . BUN 40/98/1191 13  6 - 23 mg/dL Final  . Creatinine, Ser 07/09/2014 1.0  0.4 - 1.5 mg/dL Final  . Calcium 47/82/9562 8.9  8.4 - 10.5 mg/dL Final  . GFR 13/06/6577 97.14  >60.00 mL/min Final    Physical Examination:  BP 124/71  Pulse 85  Temp(Src) 98.3 F (36.8 C)  Resp 16  Ht 5\' 9"  (1.753 m)  Wt 199 lb 9.6 oz (90.538 kg)  BMI 29.46 kg/m2  SpO2 97%  GENERAL:  he is averagely built and nourished, has somewhat apathetic expression HEENT:         Eye exam  shows normal external appearance. Fundus exam shows no retinopathy. Oral exam shows normal mucosa .  NECK:         General:  Neck exam shows no lymphadenopathy. Carotids are normal to palpation and no bruit heard.  Thyroid is not enlarged and no nodules felt.   LUNGS:         Chest is symmetrical. Lungs are clear to auscultation.Marland Kitchen   HEART:         Heart sounds:  S1 and S2 are normal. No murmurs or clicks heard., no S3 or S4.   ABDOMEN:   There is no distention present. Liver and spleen are not palpable. No other mass or tenderness present.  EXTREMITIES:  mild clubbing of the fingers present.    There is no edema.  NEUROLOGICAL:   Vibration sense is mildly reduced in toes. Ankle  jerks are absent bilaterally.          Diabetic foot exam:  inconsistent response to monofilament testing on the toes, has decreased to absent sensation MUSCULOSKELETAL:       There is no enlargement or deformity of the joints. Spine is normal to inspection.Marland Kitchen   SKIN:   He has patchy dry pigmentation throughout including on trunk. No other rash or pigmentation    ASSESSMENT:  Diabetes type 1, uncontrolled    His history suggests that he has type 1 diabetes with a history of DKA and insulin dependency since diagnosis May have some degree of insulin resistance also because of his relatively large insulin requirement despite not being obese Currently appears to be significantly hyperglycemic despite taking about 80 units of insulin daily  Complications: Peripheral neuropathy with some sensory loss, unknown status of nephropathy and retinopathy, needs evaluation  PLAN:   Stop 70/30 insulin and regular insulin  Lantus 20 units twice a day. Have given him a flow sheet and discussed how to adjust the dose 2 units every 3 days to get morning sugars below at least under 140  NovoLog insulin at mealtimes, will have him take relatively larger dose at breakfast and supper  Discussed was prandial blood sugar targets and he will  need to adjust the dose if postprandial targets are consistently over at least 180  Discussed timing and frequency of glucose monitoring, new Accu-Chek meter given and showed him how to use this  Start gradual exercise with walking up to 10 minutes or as allowed by cardiologist  Consultation with diabetes nurse educator next week to review all aspects of self-care and insulin adjustment  No need to take metformin, this is unlikely to help and he is not tolerating this anyway  Regular eye exams  Check urine microalbumin when blood sugar better controlled  Counseling time over 50% of today's 60 minute visit  [name] [name] 07/10/2014, 9:16 AM   Note: This office note was prepared with Dragon voice recognition system technology. Any transcriptional errors that result from this process are unintentional.

## 2014-07-10 NOTE — Patient Instructions (Signed)
Lantus 20 units twice daily  Adjust based on am sugar in ams  Novolog before meal 14 am;  12 lunch and 18 supper; go up or down to keep 2 hour sugar 120-180 range  ADD 2 units per 50 points over 100  WALKING daily

## 2014-07-16 ENCOUNTER — Other Ambulatory Visit: Payer: Self-pay | Admitting: *Deleted

## 2014-07-16 ENCOUNTER — Telehealth: Payer: Self-pay | Admitting: Endocrinology

## 2014-07-16 MED ORDER — INSULIN GLARGINE 100 UNIT/ML ~~LOC~~ SOLN: 20.0000 [IU] | Freq: Every day | SUBCUTANEOUS | Status: DC

## 2014-07-16 NOTE — Telephone Encounter (Signed)
rx sent

## 2014-07-16 NOTE — Telephone Encounter (Signed)
lantus vials along with the solostar pen per pt he likes to have both on hand

## 2014-07-20 ENCOUNTER — Telehealth: Payer: Self-pay | Admitting: Endocrinology

## 2014-07-26 ENCOUNTER — Encounter: Payer: Self-pay | Admitting: Endocrinology

## 2014-07-26 ENCOUNTER — Ambulatory Visit (INDEPENDENT_AMBULATORY_CARE_PROVIDER_SITE_OTHER): Payer: Commercial Managed Care - HMO | Admitting: Endocrinology

## 2014-07-26 VITALS — BP 124/70 | HR 68 | Temp 97.7°F | Resp 14 | Ht 69.0 in | Wt 204.4 lb

## 2014-07-26 DIAGNOSIS — E1065 Type 1 diabetes mellitus with hyperglycemia: Secondary | ICD-10-CM

## 2014-07-26 DIAGNOSIS — E1142 Type 2 diabetes mellitus with diabetic polyneuropathy: Secondary | ICD-10-CM

## 2014-07-26 DIAGNOSIS — IMO0002 Reserved for concepts with insufficient information to code with codable children: Secondary | ICD-10-CM

## 2014-07-26 NOTE — Patient Instructions (Signed)
LANTUS 26 UNITS IN AM AND 22 AT NIGHT  NOVOLOG 14 IN AM AND 16 AT SUPPER; if eating out take 20 units  Please check blood sugars before each meal ONCE  daily  about 2 hours after any meal and times per week on waking up. Please bring blood sugar monitor to each visit

## 2014-07-26 NOTE — Progress Notes (Signed)
Patient ID: [name] [name], male   DOB: Sep 13, 1956, 58 y.o.   MRN: [id number]           Reason for Appointment: Followup for Diabetes  Referring physician: Norma Fredrickson PCP: Ermalinda Memos  History of Present Illness:          Diagnosis: Type 2 diabetes mellitus, date of diagnosis: 1995        Past history: He thinks he was started on insulin at the time of diagnosis when he was having significantly high readings and had symptoms of dry mouth and nausea. He has  been on various insulin regimens over the years and generally managed by primary care physicians He thinks that initially his blood sugars were relatively easier to control Most likely has had poor control for the last several years Also in 09/2012 he was admitted to the hospital with diabetic ketoacidosis Until last year he was on Lantus and NovoLog but records of his A1c indicates very poor control in 2014 and also regular followup Because of the cost of Lantus and NovoLog he had on his own been buying the over-the-counter 70/30 insulin from [facility]  Recent history:  On his initial consultation and was felt to have type 1 diabetes with some insulin resistance, baseline A1c was 11% He was restarted on Lantus twice a day  on his last visit instead of 70/30 Also was started on mealtime regular insulin Currently he has checked his blood sugar very sporadically and mostly late at night This makes it difficult to identify any blood sugar patterns However most of his blood sugars are high and recently averaging 276 He has not adjusted his Lantus as directed to get his fasting readings down and has only one recent fasting glucose today He thinks he is taking his mealtime insulin as directed before eating Postprandial readings are as high as 323, eating mostly 2 meals a day       Oral hypoglycemic drugs the patient is taking are: None Side effects from medications have been: Metformin causes diarrhea INSULIN regimen is described as:  20 bid  Novolog , regular insulin 14 bid Compliance with the medical regimen: poor  Hypoglycemia: none;  with low sugars will have symptoms of feeling shaky, confused    Glucose monitoring:  sporadically        Glucometer: Accucheck       Blood Glucose readings    PREMEAL Breakfast Lunch Dinner  8 PM-2 AM  Overall  Glucose range:  184, 257  ?  ?   148-323    Mean/median:      276     Glycemic control:    Lab Results  Component Value Date   HGBA1C 11.0* 06/14/2014   HGBA1C 11.9* 07/21/2013   HGBA1C >14.0 04/06/2013   Lab Results  Component Value Date   MICROALBUR 0.50 11/12/2010   LDLCALC 88 06/15/2014   CREATININE 1.0 07/09/2014   Retinal exam: Most recent: 2 years ago  Self-care: The diet that the patient has been following is: None, recently cutting back on fat intake.      Meals: 3 meals per day.  Breakfast at 11am; is egg; lunch sandwich or salad. Eating out about 4 times a week including at fast food restaurants    Dinner 8 pm      Exercise: Some walking        Dietician visit: Most recent: 8/15 in hospital.               Weight  history: No recent weight loss or gain  Wt Readings from Last 3 Encounters:  07/26/14 204 lb 6.4 oz (92.715 kg)  07/10/14 199 lb 9.6 oz (90.538 kg)  07/02/14 198 lb 1.9 oz (89.867 kg)       Medication List       This list is accurate as of: 07/26/14  4:37 PM.  Always use your most recent med list.               ACCU-CHEK SOFTCLIX LANCETS lancets  Use as instructed to check blood sugar 3 times per day dx code 250.02     aspirin 81 MG EC tablet  Take 1 tablet (81 mg total) by mouth daily.     atorvastatin 40 MG tablet  Commonly known as:  LIPITOR  Take 1 tablet (40 mg total) by mouth daily.     buPROPion 150 MG 12 hr tablet  Commonly known as:  WELLBUTRIN SR  Take 150 mg by mouth 2 (two) times daily.     glucose blood test strip  Commonly known as:  ACCU-CHEK AVIVA PLUS  Use as instructed to check blood sugar 3 times per day dx code  250.02     HYDROcodone-acetaminophen 5-325 MG per tablet  Commonly known as:  NORCO/VICODIN  Take 1 tablet by mouth every 6 (six) hours as needed for moderate pain.     insulin aspart 100 UNIT/ML FlexPen  Commonly known as:  NOVOLOG FLEXPEN  Inject 14 units before breakfast, 12 units before lunch and 18 units at supper. Add 2 units per 50 points over 100     Insulin Glargine 100 UNIT/ML Solostar Pen  Commonly known as:  LANTUS SOLOSTAR  Inject 20 Units into the skin 2 (two) times daily.     insulin glargine 100 UNIT/ML injection  Commonly known as:  LANTUS  Inject 0.2 mLs (20 Units total) into the skin at bedtime.     Insulin Pen Needle 31G X 5 MM Misc  Commonly known as:  B-D UF III MINI PEN NEEDLES  Use to inject insulin 5 pen needles per day     isosorbide mononitrate 30 MG 24 hr tablet  Commonly known as:  IMDUR  Take 1 tablet (30 mg total) by mouth daily.     metFORMIN 500 MG tablet  Commonly known as:  GLUCOPHAGE  1,000 mg 2 (two) times daily with a meal.     metoprolol tartrate 25 MG tablet  Commonly known as:  LOPRESSOR  Take 0.5 tablets (12.5 mg total) by mouth 2 (two) times daily.     nitroGLYCERIN 0.4 MG SL tablet  Commonly known as:  NITROSTAT  Place 1 tablet (0.4 mg total) under the tongue every 5 (five) minutes as needed for chest pain (up to 3 doses).     pantoprazole 40 MG tablet  Commonly known as:  PROTONIX  Take 1 tablet (40 mg total) by mouth daily.     prasugrel 10 MG Tabs tablet  Commonly known as:  EFFIENT  Take 1 tablet (10 mg total) by mouth daily.     traZODone 50 MG tablet  Commonly known as:  DESYREL  Take 50 mg by mouth at bedtime as needed for sleep (takes 2 per day).        Allergies: No Known Allergies  Past Medical History  Diagnosis Date  . Diabetes type 2, uncontrolled   . Hypercholesterolemia   . GERD (gastroesophageal reflux disease)   . Hypertension   .  Hemorrhoids     hx of  . Peripheral neuropathy   . Depression     . CAD (coronary artery disease)     a. NSTEMI 05/2014 - occluded RCA dominant proximal s/p asp-thrombectomy/DES to RCA, minimal LAD/LCx, EF 50% by cath, 65-70% by echo.  . Barrett's esophagus   . Former tobacco use     Past Surgical History  Procedure Laterality Date  . Tonsillectomy      Family History  Problem Relation Age of Onset  . Hypertension Mother   . Alzheimer's disease Mother   . Alcohol abuse Father   . Cancer Father     "Throat"  . Diabetes type II Brother   . Heart disease Neg Hx     Social History:  reports that he quit smoking about 15 years ago. He has never used smokeless tobacco. He reports that he does not drink alcohol or use illicit drugs.    Review of Systems       Lipids: On treatment on and off for the last 2 years,  started on Lipitor 40 mg after his MI       Lab Results  Component Value Date   CHOL 163 06/15/2014   HDL 41 06/15/2014   LDLCALC 88 06/15/2014   TRIG 172* 06/15/2014   CHOLHDL 4.0 06/15/2014                  The blood pressure had been treated with medications about 10 years ago but is not on any medications now including ACE inhibitor   Complications from diabetes: Peripheral neuropathy with some sensory loss, unknown status of nephropathy and retinopathy, needs evaluation   LABS:  No visits with results within 1 Week(s) from this visit. Latest known visit with results is:  Office Visit on 07/10/2014  Component Date Value Ref Range Status  . POC Glucose 07/10/2014 265* 70 - 99 mg/dl Final    Physical Examination:  BP 124/70  Pulse 68  Temp(Src) 97.7 F (36.5 C)  Resp 14  Ht  (1.753 m)  Wt 204 lb 6.4 oz (92.715 kg)  BMI 30.17 kg/m2  SpO2 97%     ASSESSMENT/PLAN:    Diabetes type 1, uncontrolled    As discussed in history of present illness he is not getting good control with current dosage of Lantus and regular insulin Most of his problems are related to inadequate glucose monitoring and not understanding  how to adjust basal insulin on his own despite instructions Again discussed need for consistent glucose monitoring at least initially to get blood sugar patterns Discussed blood sugar targets Discussed timing of NovoLog insulin Also discussed that with larger meals especially when eating out he will need a higher dose, most likely needs more in the evening Since his blood sugars are relatively higher in the early increase his Lantus further by 6 units Regular walking and more regular exercise   Patient Instructions  LANTUS 26 UNITS IN AM AND 22 AT NIGHT  NOVOLOG 14 IN AM AND 16 AT SUPPER; if eating out take 20 units  Please check blood sugars before each meal ONCE  daily  about 2 hours after any meal and times per week on waking up. Please bring blood sugar monitor to each visit    Counseling time over 50% of today's 25 minute visit   [facility] 07/26/2014, 4:37 PM   Note: This office note was prepared with Dragon voice recognition system technology. Any transcriptional errors that result from  this process are unintentional.

## 2014-07-27 ENCOUNTER — Ambulatory Visit: Payer: Self-pay | Admitting: Family Medicine

## 2014-08-08 ENCOUNTER — Other Ambulatory Visit (INDEPENDENT_AMBULATORY_CARE_PROVIDER_SITE_OTHER): Payer: Commercial Managed Care - HMO

## 2014-08-08 DIAGNOSIS — E1065 Type 1 diabetes mellitus with hyperglycemia: Secondary | ICD-10-CM

## 2014-08-08 DIAGNOSIS — IMO0002 Reserved for concepts with insufficient information to code with codable children: Secondary | ICD-10-CM

## 2014-08-08 LAB — COMPREHENSIVE METABOLIC PANEL
ALBUMIN: 4 g/dL (ref 3.5–5.2)
ALT: 11 U/L (ref 0–53)
AST: 18 U/L (ref 0–37)
Alkaline Phosphatase: 88 U/L (ref 39–117)
BUN: 12 mg/dL (ref 6–23)
CALCIUM: 8.9 mg/dL (ref 8.4–10.5)
CHLORIDE: 103 meq/L (ref 96–112)
CO2: 25 meq/L (ref 19–32)
Creatinine, Ser: 1.1 mg/dL (ref 0.4–1.5)
GFR: 93.91 mL/min (ref >60.00)
Glucose, Bld: 274 mg/dL — ABNORMAL HIGH (ref 70–99)
Potassium: 4.4 mEq/L (ref 3.5–5.1)
SODIUM: 137 meq/L (ref 135–145)
TOTAL PROTEIN: 7 g/dL (ref 6.0–8.3)
Total Bilirubin: 0.1 mg/dL — ABNORMAL LOW (ref 0.2–1.2)

## 2014-08-08 LAB — MICROALBUMIN / CREATININE URINE RATIO
Creatinine,U: 180.4 mg/dL
Microalb Creat Ratio: 0.4 mg/g (ref 0.0–30.0)
Microalb, Ur: 0.8 mg/dL (ref 0.0–1.9)

## 2014-08-10 ENCOUNTER — Ambulatory Visit (INDEPENDENT_AMBULATORY_CARE_PROVIDER_SITE_OTHER): Payer: Commercial Managed Care - HMO | Admitting: Interventional Cardiology

## 2014-08-10 ENCOUNTER — Encounter: Payer: Self-pay | Admitting: Family Medicine

## 2014-08-10 ENCOUNTER — Ambulatory Visit: Payer: Commercial Managed Care - HMO | Admitting: Interventional Cardiology

## 2014-08-10 ENCOUNTER — Encounter: Payer: Self-pay | Admitting: Interventional Cardiology

## 2014-08-10 ENCOUNTER — Ambulatory Visit (INDEPENDENT_AMBULATORY_CARE_PROVIDER_SITE_OTHER): Payer: Medicare HMO | Admitting: Family Medicine

## 2014-08-10 VITALS — BP 108/60 | HR 93 | Ht 64.0 in | Wt 201.0 lb

## 2014-08-10 VITALS — BP 115/72 | HR 76 | Temp 98.0°F | Resp 16 | Ht 69.5 in | Wt 199.0 lb

## 2014-08-10 DIAGNOSIS — M25539 Pain in unspecified wrist: Secondary | ICD-10-CM

## 2014-08-10 DIAGNOSIS — I252 Old myocardial infarction: Secondary | ICD-10-CM

## 2014-08-10 DIAGNOSIS — M25532 Pain in left wrist: Secondary | ICD-10-CM

## 2014-08-10 DIAGNOSIS — G5602 Carpal tunnel syndrome, left upper limb: Secondary | ICD-10-CM

## 2014-08-10 DIAGNOSIS — G5600 Carpal tunnel syndrome, unspecified upper limb: Secondary | ICD-10-CM

## 2014-08-10 DIAGNOSIS — F3289 Other specified depressive episodes: Secondary | ICD-10-CM

## 2014-08-10 DIAGNOSIS — I2510 Atherosclerotic heart disease of native coronary artery without angina pectoris: Secondary | ICD-10-CM | POA: Diagnosis not present

## 2014-08-10 DIAGNOSIS — F329 Major depressive disorder, single episode, unspecified: Secondary | ICD-10-CM

## 2014-08-10 DIAGNOSIS — IMO0002 Reserved for concepts with insufficient information to code with codable children: Secondary | ICD-10-CM | POA: Insufficient documentation

## 2014-08-10 DIAGNOSIS — M79609 Pain in unspecified limb: Secondary | ICD-10-CM

## 2014-08-10 DIAGNOSIS — E1159 Type 2 diabetes mellitus with other circulatory complications: Secondary | ICD-10-CM | POA: Diagnosis not present

## 2014-08-10 DIAGNOSIS — E1151 Type 2 diabetes mellitus with diabetic peripheral angiopathy without gangrene: Secondary | ICD-10-CM | POA: Insufficient documentation

## 2014-08-10 DIAGNOSIS — M79604 Pain in right leg: Secondary | ICD-10-CM

## 2014-08-10 DIAGNOSIS — E785 Hyperlipidemia, unspecified: Secondary | ICD-10-CM | POA: Diagnosis not present

## 2014-08-10 DIAGNOSIS — E1165 Type 2 diabetes mellitus with hyperglycemia: Secondary | ICD-10-CM

## 2014-08-10 DIAGNOSIS — F32A Depression, unspecified: Secondary | ICD-10-CM

## 2014-08-10 LAB — LIPID PANEL
CHOL/HDL RATIO: 5
Cholesterol: 228 mg/dL — ABNORMAL HIGH (ref 0–200)
HDL: 42.8 mg/dL (ref >39.00)
LDL Cholesterol: 160 mg/dL — ABNORMAL HIGH (ref 0–99)
NonHDL: 185.2
Triglycerides: 127 mg/dL (ref 0.0–149.0)
VLDL: 25.4 mg/dL (ref 0.0–40.0)

## 2014-08-10 LAB — HEPATIC FUNCTION PANEL
ALBUMIN: 4.2 g/dL (ref 3.5–5.2)
ALT: 18 U/L (ref 0–53)
AST: 23 U/L (ref 0–37)
Alkaline Phosphatase: 105 U/L (ref 39–117)
Bilirubin, Direct: 0.1 mg/dL (ref 0.0–0.3)
Total Bilirubin: 0.5 mg/dL (ref 0.2–1.2)
Total Protein: 7.6 g/dL (ref 6.0–8.3)

## 2014-08-10 MED ORDER — BUPROPION HCL ER (XL) 300 MG PO TB24: 300.0000 mg | ORAL_TABLET | Freq: Every day | ORAL | Status: DC

## 2014-08-10 MED ORDER — PANTOPRAZOLE SODIUM 40 MG PO TBEC
40.0000 mg | DELAYED_RELEASE_TABLET | Freq: Every day | ORAL | Status: DC
Start: 2014-08-10 — End: 2015-04-17

## 2014-08-10 NOTE — Progress Notes (Signed)
Patient ID: [name] [name], male   DOB: Mar 17, 1956, 58 y.o.   MRN: [id number]    [address] [place], [place]  [address] Phone: [phone] Fax:  [phone]  Date:  08/10/2014   ID:  [name] [name], DOB Apr 03, 1956, MRN [id number]  PCP:  Kade Fenton, MD      History of Present Illness: [name] [name] is a 58 y.o. male who had an inferior MI in July 2015.  He had a DES to the RCA placed.  He has had some occasional chest pains like pin pricks; this is different from the heart attack pain.  He has not used NTG since the last visit with Norma Fredrickson.  His most strenuous activity is yard work.  No problems while mowing the lawn.  He has had occasional transient dizziness when working outside on hot days.  His back yard is large as well, so he can be out there for several hours at a time.  Twice a week, he goes for a walk.     Wt Readings from Last 3 Encounters:  08/10/14 201 lb (91.173 kg)  07/26/14 204 lb 6.4 oz (92.715 kg)  07/10/14 199 lb 9.6 oz (90.538 kg)     Past Medical History  Diagnosis Date  . Diabetes type 2, uncontrolled   . Hypercholesterolemia   . GERD (gastroesophageal reflux disease)   . Hypertension   . Hemorrhoids     hx of  . Peripheral neuropathy   . Depression   . CAD (coronary artery disease)     a. NSTEMI 05/2014 - occluded RCA dominant proximal s/p asp-thrombectomy/DES to RCA, minimal LAD/LCx, EF 50% by cath, 65-70% by echo.  . Barrett's esophagus   . Former tobacco use     Current Outpatient Prescriptions  Medication Sig Dispense Refill  . aspirin EC 81 MG EC tablet Take 1 tablet (81 mg total) by mouth daily.  30 tablet  11  . atorvastatin (LIPITOR) 40 MG tablet Take 1 tablet (40 mg total) by mouth daily.  30 tablet  6  . buPROPion (WELLBUTRIN SR) 150 MG 12 hr tablet Take 150 mg by mouth 2 (two) times daily.      Marland Kitchen glucose blood (ACCU-CHEK AVIVA PLUS) test strip Use as instructed to check blood sugar 3 times per day dx code 250.02  100 each  2   . HYDROcodone-acetaminophen (NORCO/VICODIN) 5-325 MG per tablet Take 1 tablet by mouth every 6 (six) hours as needed for moderate pain.      Marland Kitchen insulin aspart (NOVOLOG FLEXPEN) 100 UNIT/ML FlexPen Inject 14 units before breakfast, 12 units before lunch and 18 units at supper. Add 2 units per 50 points over 100  15 mL  2  . insulin glargine (LANTUS) 100 UNIT/ML injection Inject 0.2 mLs (20 Units total) into the skin at bedtime.  10 mL  1  . Insulin Glargine (LANTUS) 100 UNIT/ML Solostar Pen Inject 25 Units into the skin 2 (two) times daily.      . Insulin Pen Needle (B-D UF III MINI PEN NEEDLES) 31G X 5 MM MISC Use to inject insulin 5 pen needles per day  150 each  2  . isosorbide mononitrate (IMDUR) 30 MG 24 hr tablet Take 1 tablet (30 mg total) by mouth daily.  90 tablet  3  . metFORMIN (GLUCOPHAGE) 500 MG tablet 1,000 mg 2 (two) times daily with a meal.      . metoprolol tartrate (LOPRESSOR) 25 MG tablet Take 0.5  tablets (12.5 mg total) by mouth 2 (two) times daily.  60 tablet  6  . nitroGLYCERIN (NITROSTAT) 0.4 MG SL tablet Place 1 tablet (0.4 mg total) under the tongue every 5 (five) minutes as needed for chest pain (up to 3 doses).  25 tablet  3  . pantoprazole (PROTONIX) 40 MG tablet Take 1 tablet (40 mg total) by mouth daily.  30 tablet  1  . prasugrel (EFFIENT) 10 MG TABS tablet Take 1 tablet (10 mg total) by mouth daily.  30 tablet  11  . traZODone (DESYREL) 50 MG tablet Take 50 mg by mouth at bedtime as needed for sleep (takes 2 per day).       Marland Kitchen ACCU-CHEK SOFTCLIX LANCETS lancets Use as instructed to check blood sugar 3 times per day dx code 250.02  100 each  2   No current facility-administered medications for this visit.    Allergies:   No Known Allergies  Social History:  The patient  reports that he quit smoking about 15 years ago. He has never used smokeless tobacco. He reports that he does not drink alcohol or use illicit drugs.   Family History:  The patient's family history  includes Alcohol abuse in his father; Alzheimer's disease in his mother; Cancer in his father; Diabetes type II in his brother; Hypertension in his mother. There is no history of Heart disease.   ROS:  Please see the history of present illness.  No nausea, vomiting.  No fevers, chills.  No focal weakness.  No dysuria.    All other systems reviewed and negative.   PHYSICAL EXAM: VS:  BP 108/60  Pulse 93  Ht  (1.626 m)  Wt 201 lb (91.173 kg)  BMI 34.48 kg/m2  SpO2 96% Well nourished, well developed, in no acute distress HEENT: normal Neck: no JVD, no carotid bruits Cardiac:  normal S1, S2; RRR;  Lungs:  clear to auscultation bilaterally, no wheezing, rhonchi or rales Abd: soft, nontender, no hepatomegaly Ext: no edema, 2+ DP pulses bilaterally Skin: warm and dry Neuro:   no focal abnormalities noted      ASSESSMENT AND PLAN:  1. CAD: s/p DES to RCA in 7/15.  Stressed importance fo DAPT to prevent stent thrombosis.  2. Old MI?: Inferior MI.  EF 50% a time of cath.  No CHF sx.   3. Hyperlipidemia:  COntinue atorvastatin.  LDL 88 at the time of the hospital in 7/15.  Labs were done with his endocrinologist, Dr. Lucianne Muss.  No cholesterol done. Will check lipids today; has been on lipitor at this point for at least a month  4. Consider ACE-I if BP allows given Diabetes.  At this time, BP is borderline and adding medicine may lower it too much.  5. Diabetes: Followed by endocrine.  Signed, Fredric Mare, MD, [facility] 08/10/2014 11:07 AM

## 2014-08-10 NOTE — Progress Notes (Signed)
Subjective:    Patient ID: [name] [name], male    DOB: 20-Jun-1956, 58 y.o.   MRN: [id number] This chart was scribed for Sherren Mocha, MD by Julian Hy, ED Scribe. The patient was seen in Room 25. The patient's care was started at 2:51 PM.   08/10/2014 Chief Complaint  Patient presents with  . Establish Care     HPI HPI Comments: [name] [name] is a 58 y.o. male who presents to the [facility] to establish care. Pt is currently taking some computer science at [employer], and will then transfer to [employer] in the spring. He is also teaching as well. Pt notes is family is doing well. His wife is also completing her bachelor's degree. His oldest daughter is occupational therapist from [employer] and will be graduating in December.  Pt sees Dr. Madelon Lips for intermittent left shoulder pain. Pt had to hold off on a MRI on the left shoulder due to his MI. Pt notes he has had "crampy, squeezing" pain in his upper back that is triggered by smoke for years that normally resolves. Pt notes this episode last for 1-2 weeks, he felt the pain and it went resolved. Pt denies any hospitalization since July. Pt does notice some cramping in his left chest and he uses nitroglycerin with relief. Pt is having pain in his hands bilaterally. Pt saw Dr. Madelon Lips saw obstructions in his right hand but was weary to treat due to his diabetes. Pt notices sharp pains in his left wrist that causes him to drop objects. Pt notes his pain is relieved with pressure. Pt denies it is burning or stinging. Pt notes his Lantus has been increased to 25 units by Dr. Lucianne Muss. Pt has an appointment with Dr. Madelon Lips on 9/15. Pt notices a lump on his right shin with some increasing pain onset >2 months prior to his Effient usage. Pt denies any feet pain with good sensation. Pt notes it started in his physical education course. He has an appointment on 10/2 for prostate cancer screening.  This is the fourth appointment to establish care  with me. It looks like he got disability for his Type I diabetes. Apparently he had an MI, 2 months ago which was stented and now follows with Dr Eldridge Dace, which he saw earlier today. He is seeing Dr. Lucianne Muss to follow his diabetes. He just got insurance on June 30, 2014. He has Rafik Fenton listed as his PCP who is a [facility]. He has been hospitalized several times due to being suicidial. He has seen Dr. Madelon Lips for his chronic right shoulder pain. He had a CMP drawn by his endocrinologist, Dr. Lucianne Muss that was normal and his FLTs, lipid panel were checked by his cardiologist this morning.   Past Medical History  Diagnosis Date  . Diabetes type 2, uncontrolled   . Hypercholesterolemia   . GERD (gastroesophageal reflux disease)   . Hypertension   . Hemorrhoids     hx of  . Peripheral neuropathy   . Depression   . CAD (coronary artery disease)     a. NSTEMI 05/2014 - occluded RCA dominant proximal s/p asp-thrombectomy/DES to RCA, minimal LAD/LCx, EF 50% by cath, 65-70% by echo.  . Barrett's esophagus   . Former tobacco use    Current Outpatient Prescriptions on File Prior to Visit  Medication Sig Dispense Refill  . ACCU-CHEK SOFTCLIX LANCETS lancets Use as instructed to check blood sugar 3 times per day dx  code 250.02  100 each  2  . aspirin EC 81 MG EC tablet Take 1 tablet (81 mg total) by mouth daily.  30 tablet  11  . atorvastatin (LIPITOR) 40 MG tablet Take 1 tablet (40 mg total) by mouth daily.  30 tablet  6  . buPROPion (WELLBUTRIN SR) 150 MG 12 hr tablet Take 150 mg by mouth 2 (two) times daily.      Marland Kitchen glucose blood (ACCU-CHEK AVIVA PLUS) test strip Use as instructed to check blood sugar 3 times per day dx code 250.02  100 each  2  . HYDROcodone-acetaminophen (NORCO/VICODIN) 5-325 MG per tablet Take 1 tablet by mouth every 6 (six) hours as needed for moderate pain.      Marland Kitchen insulin aspart (NOVOLOG FLEXPEN) 100 UNIT/ML FlexPen Inject 14 units before breakfast, 12  units before lunch and 18 units at supper. Add 2 units per 50 points over 100  15 mL  2  . insulin glargine (LANTUS) 100 UNIT/ML injection Inject 0.2 mLs (20 Units total) into the skin at bedtime.  10 mL  1  . Insulin Glargine (LANTUS) 100 UNIT/ML Solostar Pen Inject 25 Units into the skin 2 (two) times daily.      . Insulin Pen Needle (B-D UF III MINI PEN NEEDLES) 31G X 5 MM MISC Use to inject insulin 5 pen needles per day  150 each  2  . isosorbide mononitrate (IMDUR) 30 MG 24 hr tablet Take 1 tablet (30 mg total) by mouth daily.  90 tablet  3  . metFORMIN (GLUCOPHAGE) 500 MG tablet 1,000 mg 2 (two) times daily with a meal.      . metoprolol tartrate (LOPRESSOR) 25 MG tablet Take 0.5 tablets (12.5 mg total) by mouth 2 (two) times daily.  60 tablet  6  . nitroGLYCERIN (NITROSTAT) 0.4 MG SL tablet Place 1 tablet (0.4 mg total) under the tongue every 5 (five) minutes as needed for chest pain (up to 3 doses).  25 tablet  3  . pantoprazole (PROTONIX) 40 MG tablet Take 1 tablet (40 mg total) by mouth daily.  30 tablet  1  . prasugrel (EFFIENT) 10 MG TABS tablet Take 1 tablet (10 mg total) by mouth daily.  30 tablet  11  . traZODone (DESYREL) 50 MG tablet Take 50 mg by mouth at bedtime as needed for sleep (takes 2 per day).        No current facility-administered medications on file prior to visit.   No Known Allergies  Review of Systems  Musculoskeletal: Positive for arthralgias.  All other systems reviewed and are negative.  Objective:  Triage Vitals: BP 115/72  Pulse 76  Temp(Src) 98 F (36.7 C)  Resp 16  Ht 5' 9.5" (1.765 m)  Wt 199 lb (90.266 kg)  BMI 28.98 kg/m2  SpO2 96%  Physical Exam  Nursing note and vitals reviewed. Constitutional: He is oriented to person, place, and time. He appears well-developed and well-nourished. No distress.  HENT:  Head: Normocephalic and atraumatic.  Eyes: Conjunctivae and EOM are normal.  Neck: Neck supple. No tracheal deviation present.    Cardiovascular: Normal rate, regular rhythm and normal heart sounds.   Pulmonary/Chest: Effort normal and breath sounds normal. No respiratory distress.  Musculoskeletal: Normal range of motion.  Neurological: He is alert and oriented to person, place, and time.  Negative Tinel's. Positive Phalen's on the left side. 5/5 thumb opposition strength  Skin: Skin is warm and dry.  Psychiatric: He has a  normal mood and affect. His behavior is normal.   Results for orders placed in visit on 08/10/14  LIPID PANEL      Result Value Ref Range   Cholesterol 228 (*) 0 - 200 mg/dL   Triglycerides 161.0  0.0 - 149.0 mg/dL   HDL 96.04  >54.09 mg/dL   VLDL 81.1  0.0 - 91.4 mg/dL   LDL Cholesterol 782 (*) 0 - 99 mg/dL   Total CHOL/HDL Ratio 5     NonHDL 185.20    HEPATIC FUNCTION PANEL      Result Value Ref Range   Total Bilirubin 0.5  0.2 - 1.2 mg/dL   Bilirubin, Direct 0.1  0.0 - 0.3 mg/dL   Alkaline Phosphatase 105  39 - 117 U/L   AST 23  0 - 37 U/L   ALT 18  0 - 53 U/L   Total Protein 7.6  6.0 - 8.3 g/dL   Albumin 4.2  3.5 - 5.2 g/dL       Assessment & Plan:  3:19 PM- Patient informed of current plan for treatment and evaluation and agrees with plan at this time.  Carpal tunnel syndrome of left wrist - start daily wrist brace - consider hand surgery eval.  Leg pain, anterior, right  Wrist pain, left  Depression  Meds ordered this encounter  Medications  . buPROPion (WELLBUTRIN XL) 300 MG 24 hr tablet    Sig: Take 1 tablet (300 mg total) by mouth daily.    Dispense:  90 tablet    Refill:  3  . pantoprazole (PROTONIX) 40 MG tablet    Sig: Take 1 tablet (40 mg total) by mouth daily.    Dispense:  90 tablet    Refill:  1    I personally performed the services described in this documentation, which was scribed in my presence. The recorded information has been reviewed and considered, and addended by me as needed.  Norberto Sorenson, MD MPH

## 2014-08-10 NOTE — Patient Instructions (Signed)
Your physician recommends that you return for a FASTING lipid and hepatic panels today.   Your physician recommends that you continue on your current medications as directed. Please refer to the Current Medication list given to you today.  Your physician wants you to follow-up in: 6 months with Dr. Eldridge Dace. You will receive a reminder letter in the mail two months in advance. If you don't receive a letter, please call our office to schedule the follow-up appointment.

## 2014-08-11 LAB — FRUCTOSAMINE: Fructosamine: 469 umol/L — ABNORMAL HIGH (ref 190–270)

## 2014-08-13 ENCOUNTER — Encounter: Payer: Medicare HMO | Attending: Endocrinology | Admitting: Nutrition

## 2014-08-13 DIAGNOSIS — E1165 Type 2 diabetes mellitus with hyperglycemia: Secondary | ICD-10-CM

## 2014-08-13 DIAGNOSIS — Z794 Long term (current) use of insulin: Secondary | ICD-10-CM | POA: Diagnosis not present

## 2014-08-13 DIAGNOSIS — IMO0002 Reserved for concepts with insufficient information to code with codable children: Secondary | ICD-10-CM

## 2014-08-13 DIAGNOSIS — Z713 Dietary counseling and surveillance: Secondary | ICD-10-CM | POA: Insufficient documentation

## 2014-08-13 DIAGNOSIS — IMO0001 Reserved for inherently not codable concepts without codable children: Secondary | ICD-10-CM | POA: Diagnosis present

## 2014-08-13 NOTE — Patient Instructions (Signed)
1. Call dentist back and tell them Dr. Lucianne Muss would like him seen sooner to take care of the pain/infection that is raising his blood sugars 2.  New insulin dose to start tomorrow evening.  Increase Lantus by 20% and sliding scale correction dose of novolog ac meals on top of 15u meal coverage.   Lantus:  30 u bid Novolog 15u plus extra  Dose of Novolog:  Blood sugars:        150-200:  5u     to equal a total Novolog meal dose of: 20u                                                                                                    200-250:  10u   to equal a total Novolog meal dose of: 25u                                                                                                    250-300:  15u   to equal a total Novolog meal dose of 30u                                                                                                    350-400:   20u  to equal a total Novolog meal dose of 35u                                                                                                  Over 400:   25u  to equal a total Novolog meal dose of 40u  3. Call Wednesday morning with blood sugar readings.

## 2014-08-13 NOTE — Progress Notes (Signed)
Pt. Is here to review blood sugars and insulin doses.  He did not bring his meter.  Mother says FBSs are around 200-250. He reports that his blood sugars for the last few days have been in the 300s-500s, and did not know why.  Last night blood sugar at 3AM was over 300 .  He took 20u .  He has not tested his blood sugar yet this AM, nor taken any insulin.  I tested his blood sugar and it was 169.  He does not have a sliding scale that he uses when his blood sugars are high.  He says that he just takes extra Novolog.  He has not been taking extra Novolog before his meals the last 2-3 days, despite the blood sugars being higher. His mother says that he is having a tooth ache, but will not be able to see a dentist until next week.    Present insulin dose:  Lantus 25u bid (10-11AM and 9PM),  Novolog 15u ac meals  He also says that he is here today because his orthopedic says he will be given a cortisone injection in his left shoulder tomorrow that his "probalbly make his sugar go up some".   He is eating 2 meals and a small HS snack.   Bfast is at 10AM:  Cereal and almond milk, or sausage with english muffin, and water to drink, or diet drink Supper:  4-6PM: meat of chicken or [place] (4-6 ounces), with 2 veg. (one is starchy) and sometimes bread or roll, with diet drink. HS snack (9PM):  Yogurt or diet chocolate pudding.  He does not take any novolog for this.    Plan:   1.  Call dentist back and tell them Dr. Lucianne Muss would like him seen sooner to take care of the pain/infection that is raising his blood sugars 2.  New insulin dose to start tomorrow evening.  Increase Lantus by 20% and sliding scale correction dose of novolog ac meals on top of 15u meal coverage.   Lantus:  30 u bid Novolog 15u plus extra  Dose of Novolog:  Blood sugars:        150-200:  5u    to equal a total Novolog meal dose of: 20u  200-250:  10u  to equal a total Novolog meal dose of: 25u                                                                                                    250-300:  15u  to equal a total Novolog meal dose of 30u  350-400:   20u to equal a total Novolog meal dose of 35u                                                                                                  Over 400:   25u to equal a total Novolog meal dose of 40u  3. Call Wednesday morning with blood sugar readings.  Note to Dr. Elvera Lennox to confirm dosage

## 2014-08-16 ENCOUNTER — Telehealth: Payer: Self-pay | Admitting: Cardiology

## 2014-08-16 DIAGNOSIS — E785 Hyperlipidemia, unspecified: Secondary | ICD-10-CM

## 2014-08-16 MED ORDER — ATORVASTATIN CALCIUM 80 MG PO TABS: 80.0000 mg | ORAL_TABLET | Freq: Every day | ORAL | Status: DC

## 2014-08-16 NOTE — Telephone Encounter (Signed)
Pt notified, meds updated and labs ordered.  

## 2014-08-16 NOTE — Telephone Encounter (Signed)
Message copied by Theda Sers on Thu Aug 16, 2014  5:57 PM ------      Message from: SMART, Gaspar Skeeters      Created: Wed Aug 15, 2014  4:26 PM       Atorvastatin 40 mg qd should have adequately lowered his LDL after being on it for only 7-10 days, so a month should have been long enough.  Have patient increase atorvastatin to 80 mg qd and recheck lipid panel and hepatic panel in 8 weeks.  Please notify patient, update meds, and set up labs. Thanks. ------

## 2014-08-22 ENCOUNTER — Ambulatory Visit: Payer: Commercial Managed Care - HMO | Admitting: Interventional Cardiology

## 2014-08-31 ENCOUNTER — Ambulatory Visit (INDEPENDENT_AMBULATORY_CARE_PROVIDER_SITE_OTHER): Payer: Medicare HMO | Admitting: Family Medicine

## 2014-08-31 ENCOUNTER — Encounter: Payer: Self-pay | Admitting: Family Medicine

## 2014-08-31 VITALS — BP 131/80 | HR 89 | Temp 98.8°F | Resp 16 | Ht 69.5 in | Wt 206.6 lb

## 2014-08-31 DIAGNOSIS — G2581 Restless legs syndrome: Secondary | ICD-10-CM

## 2014-08-31 DIAGNOSIS — I8001 Phlebitis and thrombophlebitis of superficial vessels of right lower extremity: Secondary | ICD-10-CM

## 2014-08-31 DIAGNOSIS — Z125 Encounter for screening for malignant neoplasm of prostate: Secondary | ICD-10-CM

## 2014-08-31 DIAGNOSIS — G8929 Other chronic pain: Secondary | ICD-10-CM

## 2014-08-31 DIAGNOSIS — E1165 Type 2 diabetes mellitus with hyperglycemia: Secondary | ICD-10-CM

## 2014-08-31 DIAGNOSIS — M25512 Pain in left shoulder: Secondary | ICD-10-CM

## 2014-08-31 DIAGNOSIS — B351 Tinea unguium: Secondary | ICD-10-CM

## 2014-08-31 DIAGNOSIS — Z79899 Other long term (current) drug therapy: Secondary | ICD-10-CM

## 2014-08-31 DIAGNOSIS — Z23 Encounter for immunization: Secondary | ICD-10-CM

## 2014-08-31 DIAGNOSIS — Z0000 Encounter for general adult medical examination without abnormal findings: Secondary | ICD-10-CM

## 2014-08-31 DIAGNOSIS — F32A Depression, unspecified: Secondary | ICD-10-CM

## 2014-08-31 DIAGNOSIS — F329 Major depressive disorder, single episode, unspecified: Secondary | ICD-10-CM

## 2014-08-31 DIAGNOSIS — IMO0002 Reserved for concepts with insufficient information to code with codable children: Secondary | ICD-10-CM

## 2014-08-31 LAB — COMPLETE METABOLIC PANEL WITH GFR
ALK PHOS: 96 U/L (ref 39–117)
ALT: 14 U/L (ref 0–53)
AST: 23 U/L (ref 0–37)
Albumin: 4.5 g/dL (ref 3.5–5.2)
BILIRUBIN TOTAL: 0.7 mg/dL (ref 0.2–1.2)
BUN: 11 mg/dL (ref 6–23)
CO2: 24 mEq/L (ref 19–32)
Calcium: 9.4 mg/dL (ref 8.4–10.5)
Chloride: 104 mEq/L (ref 96–112)
Creat: 0.89 mg/dL (ref 0.50–1.35)
GFR, Est African American: >89 mL/min
GFR, Est Non African American: >89 mL/min
Glucose, Bld: 146 mg/dL — ABNORMAL HIGH (ref 70–99)
Potassium: 4 mEq/L (ref 3.5–5.3)
SODIUM: 140 meq/L (ref 135–145)
TOTAL PROTEIN: 6.7 g/dL (ref 6.0–8.3)

## 2014-08-31 MED ORDER — ROPINIROLE HCL 0.5 MG PO TABS: 0.5000 mg | ORAL_TABLET | Freq: Three times a day (TID) | ORAL | Status: DC

## 2014-08-31 MED ORDER — TERBINAFINE HCL 250 MG PO TABS: 250.0000 mg | ORAL_TABLET | Freq: Every day | ORAL | Status: DC

## 2014-08-31 NOTE — Progress Notes (Signed)
IDENTIFYING INFORMATION  Erby PianKevin Bunten / male / [birth year] / 58 y.o. / MRN: [id number]  SUBJECTIVE  Chief Complaint: had a chief complaint of Annual Exam and additional complaints of Leg Pain, Shoulder Pain, Wrist Pain.    History of present illness:  With regard to his Diabetes, Mr. Lesle Reekutry's blood sugars range from 70 to 300.  He reports an episode of hypoglycemia last night, in which he became shaky and started having palpitations.  He then measured his sugar at 70, drank some sweet tea and awoke again later with a sugar of 55.  He reports taking insulin last night without eating, which, by his account, does not happen often.  He denies nerve pain or changes in seensation in his feet.    Colorectal screening was discussed and he was screened with colonoscopy in 2013.  He reports that two polyps were removed and he was advised to come back in 5 years at that time. PSA screening was also discussed, and he is amenable to that test today. He does not report a family history of prostate cancer, and states he has to pee very often, but does not endorse other urinary symptoms.    He reports very poor and inconsistent sleep.  He does not take trazodone because it makes him groggy during the day.  He has tried Ambien in the past and would like to try it again.  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    He has requested a refill of his oxycodone given his shoulder pain.  He did have an injection a few weeks back and it gave him some relief, but "not like it has in the past."    He reports his hearing is poor in the presence of ambient noise, such as having the windows down.    The problem list, allergies, medications, family, surgical, and social history were reviewed by me and exist elsewhere in the encounter.       Review of Systems  Constitutional: Negative.   HENT: Positive for hearing loss. Negative for congestion and sore throat.   Eyes: Negative.   Respiratory: Negative for cough and wheezing.   Cardiovascular: Negative for leg swelling and PND.  Gastrointestinal: Negative.   Genitourinary: Positive for frequency. Negative for dysuria and urgency.  Musculoskeletal: Positive for joint pain (Right shoulder and bilateral wrists).  Skin: Negative.   Neurological: Negative.   Psychiatric/Behavioral: The patient has insomnia.     OBJECTIVE  Blood pressure 131/80, pulse 89, temperature 98.8 F (37.1 C), temperature source Oral, resp. rate 16, height 5' 9.5" (1.765 m), weight 206 lb 9.6 oz (93.713 kg), SpO2 99.00%.  Physical Exam  Constitutional: He is oriented to person, place, and time and well-developed, well-nourished, and in no distress.  HENT:  Head: Normocephalic.  Right Ear: External ear normal.  Left ear canal impacted with cerumen  Cardiovascular: Normal rate, regular rhythm and normal heart sounds.   Pulmonary/Chest: Effort normal and breath sounds normal. He exhibits no tenderness.  Abdominal: Soft. Bowel sounds are normal. He exhibits no distension and no mass. There is no tenderness. There is no rebound and no guarding.  Musculoskeletal: Normal range of motion.  Neurological: He is alert and oriented to person, place, and time. No cranial nerve deficit.  He exhibits normal muscle tone. Coordination normal.  Skin: Skin is warm and dry. No rash noted. No erythema. No pallor.  Psychiatric: Mood, memory, affect and judgment normal.    ASSESSMENT & PLAN   Annual physical exam  DM (diabetes mellitus), type 2, uncontrolled: Followed by endocrinology  Chronic pain in left shoulder: Ambulatory referral to PN management  Need for influenza vaccination - Plan: Flu Vaccine QUAD 36+ mos IM  Screening for prostate cancer - Plan: PSA  Depression: Will monitor.  Consider adjunct  SSRI therapy during f/u  Restless legs: Ropinirole as prescribed  Toenail fungus: Lamisil 250 mg po daily for 6 months  Superficial thrombophlebitis of lower extremity, right  Deliah Boston, MS, PA-C [facility] 08/31/2014 4:07 PM

## 2014-08-31 NOTE — Patient Instructions (Addendum)
Let me know where you would like a referral for pain management.  There are multiple placed in town and if you want to look on the yellow pages or google that is best but some are:  [facility]  [facility] - Dr. Odette Fraction [address] [place], [place] [address] Phone: [phone]  [facility]  [facility]  [facility]  Start 1/2 tab of the requip x 1 wk and decrease your trazodone to 1/2 tab, then increase requip to 1 tab after a week. If you need to double it to 2 tabs a night after another week that is fine. I will see you back in 1 month to see how you responded and reassess your sleep as well as to check your liver since starting on the lamisil for toenail fungus and review the ultrasound results for your leg pain/knot.  Keeping you healthy  Get these tests  Blood pressure- Have your blood pressure checked once a year by your healthcare provider.  Normal blood pressure is 120/80  Weight- Have your body mass index (BMI) calculated to screen for obesity.  BMI is a measure of body fat based on height and weight. You can also calculate your own BMI at [email or web address].  Cholesterol- Have your cholesterol checked every year.  Diabetes- Have your blood sugar checked regularly if you have high blood pressure, high cholesterol, have a family history of diabetes or if you are overweight.  Screening for Colon Cancer- Colonoscopy starting at age 42.  Screening may begin sooner depending on your family history and other health conditions. Follow up colonoscopy as directed by your Gastroenterologist.  Screening for Prostate Cancer- Both blood work (PSA) and a rectal exam help screen for Prostate Cancer.  Screening begins at age 53 with African-American men and at age 2 with Caucasian men.  Screening may begin sooner depending on your family history.  Take these medicines  Aspirin- One  aspirin daily can help prevent Heart disease and Stroke.  Flu shot- Every fall.  Tetanus- Every 10 years.  Zostavax- Once after the age of 106 to prevent Shingles.  Pneumonia shot- Once after the age of 27; if you are younger than 10, ask your healthcare provider if you need a Pneumonia shot.  Take these steps  Don't smoke- If you do smoke, talk to your doctor about quitting.  For tips on how to quit, go to www.smokefree.gov or call 1-800-QUIT-NOW.  Be physically active- Exercise 5 days a week for at least 30 minutes.  If you are not already physically active start slow and gradually work up to 30 minutes of moderate physical activity.  Examples of moderate activity include walking briskly, mowing the yard, dancing, swimming, bicycling, etc.  Eat a healthy diet- Eat a variety of healthy food such as fruits, vegetables, low fat milk, low fat cheese, yogurt, lean meant, poultry, fish, beans, tofu, etc. For more information go to www.thenutritionsource.org  Drink alcohol in moderation- Limit alcohol intake to less than two drinks a day. Never drink and drive.  Dentist- Brush and floss twice daily; visit your dentist twice a year.  Depression- Your emotional health is as important as your physical health. If you're feeling down, or losing interest in things you would normally enjoy please talk to your healthcare provider.  Eye exam- Visit your eye doctor every year.  Safe sex- If you may be exposed to a sexually transmitted infection, use a condom.  Seat belts- Seat belts can save your life; always wear one.  Smoke/Carbon Monoxide detectors- These detectors need to be installed on the appropriate level of your home.  Replace batteries at least once a year.  Skin cancer- When out in the sun, cover up and use sunscreen 15 SPF or higher.  Violence- If anyone is threatening you, please tell your healthcare provider.  Living Will/ Health care power of attorney- Speak with your healthcare  provider and family.

## 2014-08-31 NOTE — Progress Notes (Signed)
Patient returned to clinic at 4:45pm after visit, requesting Dr [name] proceed with the referral to the pain clinic, I have pended this referral for her to sign, patient states Dr [name] may have a preference for where the referral should go.

## 2014-09-01 LAB — PSA: PSA: 0.77 ng/mL (ref <=4.00)

## 2014-09-06 ENCOUNTER — Other Ambulatory Visit: Payer: Self-pay | Admitting: Orthopedic Surgery

## 2014-09-06 DIAGNOSIS — M25511 Pain in right shoulder: Secondary | ICD-10-CM

## 2014-09-15 ENCOUNTER — Other Ambulatory Visit: Payer: Commercial Managed Care - HMO

## 2014-09-27 NOTE — Progress Notes (Signed)
Pt assessed, reviewed documentation and agree w/ assessment and plan. Norberto Sorenson, MD MPH   Annual physical exam  DM (diabetes mellitus), type 2, uncontrolled  Chronic pain in left shoulder - Plan: Ambulatory referral to Pain Clinic per pt request. Given names of clinics in town for this.  Need for influenza vaccination - Plan: Flu Vaccine QUAD 36+ mos IM  Screening for prostate cancer - Plan: PSA  Depression  Restless legs - Plan: rOPINIRole (REQUIP) 0.5 MG tablet - Start 1/2 tab of the requip x 1 wk and decrease your trazodone to 1/2 tab, then increase requip to 1 tab after a week. If you need to double it to 2 tabs a night after another week that is fine.  Reasses in 1 mo  Toenail fungus - Plan: terbinafine (LAMISIL) 250 MG tablet, DISCONTINUED: terbinafine (LAMISIL) 250 MG tablet.  Will need lfts at f/u in 4-6 wks.  Superficial thrombophlebitis of lower extremity, right - send for [place] and reassess at f/u  Encounter for long-term (current) use of medications - Plan: COMPLETE METABOLIC PANEL WITH GFR, Ambulatory referral to Pain Clinic  Meds ordered this encounter  Medications  . rOPINIRole (REQUIP) 0.5 MG tablet    Sig: Take 1 tablet (0.5 mg total) by mouth 3 (three) times daily. 1/2 tab 3 times daily for one week, then 1 tab 3 times daily thereafter    Dispense:  30 tablet    Refill:  1    Order Specific Question:  Supervising Provider    Answer:  Neva Seat, JEFFREY R [[id number]]  . DISCONTD: terbinafine (LAMISIL) 250 MG tablet    Sig: Take 1 tablet (250 mg total) by mouth daily.    Dispense:  90 tablet    Refill:  3    Order Specific Question:  Supervising Provider    Answer:  Neva Seat, JEFFREY R [[id number]]  . terbinafine (LAMISIL) 250 MG tablet    Sig: Take 1 tablet (250 mg total) by mouth daily.    Dispense:  90 tablet    Refill:  1    Please disreguard prior rx w/ 3 refills     Norberto Sorenson, MD MPH

## 2014-10-01 ENCOUNTER — Other Ambulatory Visit: Payer: Self-pay | Admitting: *Deleted

## 2014-10-01 MED ORDER — INSULIN ASPART 100 UNIT/ML FLEXPEN: PEN_INJECTOR | SUBCUTANEOUS | Status: DC

## 2014-10-05 ENCOUNTER — Ambulatory Visit: Payer: Medicare HMO | Admitting: Family Medicine

## 2014-10-16 ENCOUNTER — Other Ambulatory Visit: Payer: Commercial Managed Care - HMO

## 2014-10-17 ENCOUNTER — Other Ambulatory Visit (INDEPENDENT_AMBULATORY_CARE_PROVIDER_SITE_OTHER): Payer: Commercial Managed Care - HMO | Admitting: *Deleted

## 2014-10-17 DIAGNOSIS — E785 Hyperlipidemia, unspecified: Secondary | ICD-10-CM

## 2014-10-17 LAB — HEPATIC FUNCTION PANEL
ALBUMIN: 4.3 g/dL (ref 3.5–5.2)
ALT: 14 U/L (ref 0–53)
AST: 16 U/L (ref 0–37)
Alkaline Phosphatase: 114 U/L (ref 39–117)
Bilirubin, Direct: 0 mg/dL (ref 0.0–0.3)
TOTAL PROTEIN: 7.3 g/dL (ref 6.0–8.3)
Total Bilirubin: 0.9 mg/dL (ref 0.2–1.2)

## 2014-10-17 LAB — LIPID PANEL
Cholesterol: 152 mg/dL (ref 0–200)
HDL: 45.4 mg/dL
LDL Cholesterol: 88 mg/dL (ref 0–99)
NonHDL: 106.6
Total CHOL/HDL Ratio: 3
Triglycerides: 93 mg/dL (ref 0.0–149.0)
VLDL: 18.6 mg/dL (ref 0.0–40.0)

## 2014-10-18 ENCOUNTER — Ambulatory Visit: Payer: Commercial Managed Care - HMO | Admitting: Family Medicine

## 2014-10-19 ENCOUNTER — Ambulatory Visit (INDEPENDENT_AMBULATORY_CARE_PROVIDER_SITE_OTHER): Payer: Medicare HMO | Admitting: Family Medicine

## 2014-10-19 ENCOUNTER — Encounter: Payer: Self-pay | Admitting: Family Medicine

## 2014-10-19 VITALS — BP 114/73 | HR 85 | Temp 98.3°F | Resp 16 | Ht 70.0 in | Wt 214.0 lb

## 2014-10-19 DIAGNOSIS — G8929 Other chronic pain: Secondary | ICD-10-CM

## 2014-10-19 DIAGNOSIS — F329 Major depressive disorder, single episode, unspecified: Secondary | ICD-10-CM

## 2014-10-19 DIAGNOSIS — Z79899 Other long term (current) drug therapy: Secondary | ICD-10-CM

## 2014-10-19 DIAGNOSIS — M25512 Pain in left shoulder: Secondary | ICD-10-CM

## 2014-10-19 DIAGNOSIS — B351 Tinea unguium: Secondary | ICD-10-CM

## 2014-10-19 DIAGNOSIS — I8001 Phlebitis and thrombophlebitis of superficial vessels of right lower extremity: Secondary | ICD-10-CM

## 2014-10-19 DIAGNOSIS — E1165 Type 2 diabetes mellitus with hyperglycemia: Secondary | ICD-10-CM

## 2014-10-19 DIAGNOSIS — F32A Depression, unspecified: Secondary | ICD-10-CM

## 2014-10-19 DIAGNOSIS — R2242 Localized swelling, mass and lump, left lower limb: Secondary | ICD-10-CM

## 2014-10-19 DIAGNOSIS — IMO0002 Reserved for concepts with insufficient information to code with codable children: Secondary | ICD-10-CM

## 2014-10-19 DIAGNOSIS — G2581 Restless legs syndrome: Secondary | ICD-10-CM

## 2014-10-19 NOTE — Progress Notes (Signed)
MRN: [id number] DOB: 08-17-1956  Subjective:   [name] [name] is a 58 y.o. male presenting for 1 month follow up of multiple issues.  Depression - psychiatric care at [facility], seeing therapist and psychiatrist, has been seen there for 2-3 years. Initially started going there due to lack of insurance, but is now exploring other options since he has obtained insurance. Currently managed with 300mg  Wellbutrin, tolerating this well. Feels like this is helping him. Mood is better but still having difficulty managing all his conditions. Last seen in 07/2014 at [facility]. Is scheduled for follow up in 10/2014.  Shoulder pain - evaluated by Dr. Eulah Pont at [facility], referred for MRI. 10/30/2014, 10/28/2014 MRI. Does not currently need refills on pain medication. Still waiting on referral for pain management.  Diabetes - lantus and novolog, no other diabetes medications. Blood sugar varies, still working on lowering his A1c. Followed by Endocrinology, Dr. Lucianne Muss.  Leg pain - mass/bump/lump in his anterior left leg for the past month. Mass is moderately painful, worse with touch. Denies erythema, swelling of the leg, decreased ROM, sensation, loss of hair, no relieving or aggravating factors, denies injury or trauma to leg. Patient previously had very similar mass in right leg for which an [place] was ordered but never completed. Mass has since resolved using a topical Bengay type cream.  Restless legs - started requip, has had significant improvement. Sleep is improved and does feel his legs as jerky as it was prior to Requip.  Toenail fungus - started lamesil, reports improvement in nails. LFTs checked by Cardiologist, within normal limits.  Denies need for refills. Denies smoking, denies alcohol use. Denies any other aggravating or relieving factors, no other questions or concerns.   Prior to Admission medications   Medication Sig Start Date End Date Taking? Authorizing Provider  ACCU-CHEK SOFTCLIX  LANCETS lancets Use as instructed to check blood sugar 3 times per day dx code 250.02 07/10/14  Yes Reather Littler, MD  aspirin EC 81 MG EC tablet Take 1 tablet (81 mg total) by mouth daily. 06/18/14  Yes Dayna N Dunn, PA-C  atorvastatin (LIPITOR) 80 MG tablet Take 1 tablet (80 mg total) by mouth daily. 08/16/14  Yes Corky Crafts, MD  buPROPion (WELLBUTRIN XL) 300 MG 24 hr tablet Take 1 tablet (300 mg total) by mouth daily. 08/10/14  Yes Sherren Mocha, MD  glucose blood (ACCU-CHEK AVIVA PLUS) test strip Use as instructed to check blood sugar 3 times per day dx code 250.02 07/10/14  Yes Reather Littler, MD  HYDROcodone-acetaminophen (NORCO/VICODIN) 5-325 MG per tablet Take 1 tablet by mouth every 6 (six) hours as needed for moderate pain.   Yes Historical Provider, MD  insulin aspart (NOVOLOG FLEXPEN) 100 UNIT/ML FlexPen Inject 14 units before breakfast, 12 units before lunch and 18 units at supper. Add 2 units per 50 points over 100 Patient taking differently: 20 units in AM and 20 units at night, based on sliding scale. Add 2 units per 50 points over 100 10/01/14  Yes Reather Littler, MD  insulin glargine (LANTUS) 100 UNIT/ML injection Inject 0.2 mLs (20 Units total) into the skin at bedtime. Patient taking differently: Inject 20 Units into the skin 2 (two) times daily.  07/16/14  Yes Reather Littler, MD  Insulin Pen Needle (B-D UF III MINI PEN NEEDLES) 31G X 5 MM MISC Use to inject insulin 5 pen needles per day 07/10/14  Yes Reather Littler, MD  isosorbide mononitrate (IMDUR) 30 MG 24 hr tablet Take  1 tablet (30 mg total) by mouth daily. 07/02/14  Yes Rosalio Macadamia, NP  metoprolol tartrate (LOPRESSOR) 25 MG tablet Take 0.5 tablets (12.5 mg total) by mouth 2 (two) times daily. 06/18/14  Yes Dayna N Dunn, PA-C  nitroGLYCERIN (NITROSTAT) 0.4 MG SL tablet Place 1 tablet (0.4 mg total) under the tongue every 5 (five) minutes as needed for chest pain (up to 3 doses). 06/18/14  Yes Dayna N Dunn, PA-C  pantoprazole (PROTONIX) 40 MG  tablet Take 1 tablet (40 mg total) by mouth daily. 08/10/14  Yes Sherren Mocha, MD  prasugrel (EFFIENT) 10 MG TABS tablet Take 1 tablet (10 mg total) by mouth daily. 06/18/14  Yes Dayna N Dunn, PA-C  rOPINIRole (REQUIP) 0.5 MG tablet Take 1 tablet (0.5 mg total) by mouth 3 (three) times daily. 1/2 tab 3 times daily for one week, then 1 tab 3 times daily thereafter 08/31/14  Yes Sherren Mocha, MD  terbinafine (LAMISIL) 250 MG tablet Take 1 tablet (250 mg total) by mouth daily. 08/31/14  Yes Sherren Mocha, MD  traZODone (DESYREL) 50 MG tablet Take 50 mg by mouth at bedtime as needed for sleep (takes 2 per day).    Yes Historical Provider, MD    No Known Allergies   Past Medical History  Diagnosis Date  . Diabetes type 2, uncontrolled   . Hypercholesterolemia   . GERD (gastroesophageal reflux disease)   . Hypertension   . Hemorrhoids     hx of  . Peripheral neuropathy   . Depression   . CAD (coronary artery disease)     a. NSTEMI 05/2014 - occluded RCA dominant proximal s/p asp-thrombectomy/DES to RCA, minimal LAD/LCx, EF 50% by cath, 65-70% by echo.  . Barrett's esophagus   . Former tobacco use     Past Surgical History  Procedure Laterality Date  . Tonsillectomy      ROS As in subjective.   Objective:   Vitals: BP 114/73 mmHg  Pulse 85  Temp(Src) 98.3 F (36.8 C)  Resp 16  Ht 5\' 10"  (1.778 m)  Wt 214 lb (97.07 kg)  BMI 30.71 kg/m2  SpO2 96%  Physical Exam  Constitutional: He is oriented to person, place, and time and well-developed, well-nourished, and in no distress. No distress.  Cardiovascular: Normal rate, regular rhythm, normal heart sounds and intact distal pulses.  Exam reveals no gallop and no friction rub.   No murmur heard. Pulmonary/Chest: Effort normal and breath sounds normal. No respiratory distress. He has no wheezes. He has no rales. He exhibits no tenderness.  Abdominal: Soft. Bowel sounds are normal. He exhibits no distension. There is no tenderness.    Musculoskeletal: Normal range of motion. He exhibits edema (~1cm tender mass in anterior left lower extremity, no swelling or erythema, good ROM) and tenderness.  Neurological: He is alert and oriented to person, place, and time.  Skin: Skin is warm and dry. No rash noted. He is not diaphoretic. No erythema.  Psychiatric: His affect is not labile. He is not agitated. He is not apathetic. He has a flat affect.   Results for orders placed or performed in visit on 10/17/14 (from the past 72 hour(s))  Lipid Profile     Status: None   Collection Time: 10/17/14  2:46 PM  Result Value Ref Range   Cholesterol 152 0 - 200 mg/dL    Comment: ATP III Classification       Desirable:  < 200 mg/dL  Borderline High:  200 - 239 mg/dL          High:  > = 960240 mg/dL   Triglycerides 45.493.0 0.0 - 149.0 mg/dL    Comment: Normal:  <098<150 mg/dLBorderline High:  150 - 199 mg/dL   HDL 11.9145.40 >47.82>39.00 mg/dL   VLDL 95.618.6 0.0 - 21.340.0 mg/dL   LDL Cholesterol 88 0 - 99 mg/dL   Total CHOL/HDL Ratio 3     Comment:                Men          Women1/2 Average Risk     3.4          3.3Average Risk          5.0          4.42X Average Risk          9.6          7.13X Average Risk          15.0          11.0                       NonHDL 106.60     Comment: NOTE:  Non-HDL goal should be 30 mg/dL higher than patient's LDL goal (i.e. LDL goal of < 70 mg/dL, would have non-HDL goal of < 100 mg/dL)  Hepatic function panel     Status: None   Collection Time: 10/17/14  2:46 PM  Result Value Ref Range   Total Bilirubin 0.9 0.2 - 1.2 mg/dL   Bilirubin, Direct 0.0 0.0 - 0.3 mg/dL   Alkaline Phosphatase 114 39 - 117 U/L   AST 16 0 - 37 U/L   ALT 14 0 - 53 U/L   Total Protein 7.3 6.0 - 8.3 g/dL   Albumin 4.3 3.5 - 5.2 g/dL    Assessment and Plan :    1. Toenail fungus - LFTs wnl, nail fungus improved, continue Lamisil, follow up in 3 months - Hepatic Function Panel  2. Medication management - Hepatic Function Panel -  Continue referral to pain management - No refills needed today - f/u as above  3. Localized swelling, mass, or lump of lower extremity, left - possible venous insufficiency, varicose veins, U/S pending, f/u next week via s/p U/S - Lower Extremity Venous Duplex Left; Future  4. DM (diabetes mellitus), type 2, uncontrolled - Continue f/u with Dr. Lucianne MussKumar  5. Chronic pain in left shoulder - MRI scheduled, continue f/u with Dr. Eulah PontMurphy  6. Restless legs - continue Requip, f/u as above  7. Depression - stable, continue f/u with [facility], advised to consider other practices for psychiatric care, advised to follow through with December appointment but try a different practice down the road now that he has insurance - continue Wellbutrin 300mg  XL  8. Superficial thrombophlebitis of lower extremity, right - Resolved, monitor   Wallis BambergMario Kierre Hintz, PA-C [facility] [phone] 10/19/2014 4:34 PM

## 2014-10-19 NOTE — Progress Notes (Signed)
Subjective:    Patient ID: [name] [name], male    DOB: [birth year], 58 y.o.   MRN: [id number] This chart was scribed for [name], MD by Zola Button, Medical Scribe. This patient was seen in Room 27 and the patient's care was started at 3:18 PM.   HPI HPI Comments: [name] [name] is a 58 y.o. male with a hx of DM who presents to the [facility] for a follow-up.  He has poorly controlled labile type II DM followed by endocrinology, Dr. Dwyane Dee. He also has some chronic shoulder pain for which he has seen orthopedics, Dr. French Ana with minimal relief. He has been on narcotic therapy. Patient requested referral to pain management clinic to continue this. Started patient on Requip (for restless legs syndrome), started him on Lamisil (for toenail fungus) and decreased his trazodone during last visit. Will recheck on sleep today. We intended for patient to have [place] of right lower leg, but it does not appear to have been done. Patient was reporting that the trazodone was making him groggy at night, which is why we cut it in half. He had a lipid panel and LFTs checked two days ago, which were normal. He has an MRI of his right shoulder scheduled for 11/29.    Review of Systems     Objective:  BP 114/73 mmHg  Pulse 85  Temp(Src) 98.3 F (36.8 C)  Resp 16  Ht 5' 10"  (1.778 m)  Wt 214 lb (97.07 kg)  BMI 30.71 kg/m2  SpO2 96%  Physical Exam  Constitutional: He is oriented to person, place, and time. He appears well-developed and well-nourished. No distress.  HENT:  Head: Normocephalic and atraumatic.  Eyes: No scleral icterus.  Pulmonary/Chest: Effort normal.  Musculoskeletal:  Palpable but not visible poorly defined subcutaneous soft mass on lateral aspect of anterior left shin, mildly ttp, no fluctuance, warmth, induration, or erythema  Neurological: He is alert and oriented to person, place, and time.  Skin: Skin is warm and dry. No rash noted. He is not diaphoretic. No erythema.   Psychiatric: He has a normal mood and affect. His behavior is normal.          Assessment & Plan:  Localized swelling, mass, or lump of lower extremity, left - Plan: Lower Extremity Venous Duplex Left - unknown etiology - had similar lesion on right shin 2 mos ago spontaneously resolve - will ask for soft tissue US of left leg to further evaluate with watchful waiting and heat - if enlarges or becomes more tender cons ESR/CRP/CPK and may need to proceed w/ MRI if Korea unrevealing  Toenail fungus - Plan: CANCELED: Hepatic Function Panel - cont po lamisil x 4 mos  Medication management - Plan: CANCELED: Hepatic Function Panel - had LFT drawn by cardiology 2d prev - nml so ok to cont lamisil  DM (diabetes mellitus), type 2, uncontrolled - followed closely by Dr. Dwyane Dee  Chronic pain in left shoulder - referred to pain management - not yet scheduled - rec [facility] or [facility] as pt prefers medication therapy. Will cont to f/u w/ Dr. French Ana w/ MRI of L shoulder next  wks  Restless legs - doing great on requip  Depression - doing better on wellbutrin 300 - plans to f/u w/ psych at [facility]  Superficial thrombophlebitis of lower extremity, right    I personally performed the services described in this documentation, which was scribed in my presence. The recorded information  has been reviewed and considered, and addended by me as needed.  [name], MD MPH

## 2014-10-24 ENCOUNTER — Ambulatory Visit (HOSPITAL_COMMUNITY): Payer: Medicare HMO | Attending: Cardiology | Admitting: *Deleted

## 2014-10-24 DIAGNOSIS — M7989 Other specified soft tissue disorders: Secondary | ICD-10-CM | POA: Diagnosis not present

## 2014-10-24 DIAGNOSIS — E785 Hyperlipidemia, unspecified: Secondary | ICD-10-CM | POA: Diagnosis not present

## 2014-10-24 DIAGNOSIS — E119 Type 2 diabetes mellitus without complications: Secondary | ICD-10-CM | POA: Insufficient documentation

## 2014-10-24 DIAGNOSIS — I2510 Atherosclerotic heart disease of native coronary artery without angina pectoris: Secondary | ICD-10-CM | POA: Diagnosis not present

## 2014-10-24 DIAGNOSIS — Z87891 Personal history of nicotine dependence: Secondary | ICD-10-CM | POA: Diagnosis not present

## 2014-10-24 DIAGNOSIS — R2242 Localized swelling, mass and lump, left lower limb: Secondary | ICD-10-CM

## 2014-10-24 NOTE — Progress Notes (Signed)
Left lower extremity venous duplex complete  

## 2014-10-26 ENCOUNTER — Telehealth: Payer: Self-pay

## 2014-10-26 NOTE — Telephone Encounter (Signed)
-----   Message from Sherren Mocha, MD sent at 10/24/2014 11:06 PM EST ----- Regarding: FW: Venous duplex Prelim Please let pt know that left lower ext doppler negative for blood clot - vein in left leg are normal. ----- Message -----    From: Alonza Smoker    Sent: 10/24/2014   2:09 PM      To: Sherren Mocha, MD Subject: Venous duplex Prelim                           Venous duplex of the left lower extremity appears negative for acute DVT.  Thank you.

## 2014-10-26 NOTE — Telephone Encounter (Signed)
Pt has been notified about results.

## 2014-10-28 ENCOUNTER — Ambulatory Visit
Admission: RE | Admit: 2014-10-28 | Discharge: 2014-10-28 | Disposition: A | Discharge status: Home / Self Care | Payer: Commercial Managed Care - HMO | Source: Ambulatory Visit | Attending: Orthopedic Surgery | Admitting: Orthopedic Surgery

## 2014-10-28 DIAGNOSIS — M25511 Pain in right shoulder: Secondary | ICD-10-CM

## 2014-11-03 ENCOUNTER — Ambulatory Visit
Admission: RE | Admit: 2014-11-03 | Discharge: 2014-11-03 | Disposition: A | Discharge status: Home / Self Care | Payer: Commercial Managed Care - HMO | Source: Ambulatory Visit | Attending: Orthopedic Surgery | Admitting: Orthopedic Surgery

## 2014-11-05 ENCOUNTER — Other Ambulatory Visit: Payer: Self-pay | Admitting: Endocrinology

## 2014-11-06 ENCOUNTER — Other Ambulatory Visit: Payer: Self-pay | Admitting: Endocrinology

## 2014-11-06 ENCOUNTER — Telehealth: Payer: Self-pay

## 2014-11-06 MED ORDER — INSULIN GLARGINE 100 UNIT/ML ~~LOC~~ SOLN: 20.0000 [IU] | Freq: Every day | SUBCUTANEOUS | Status: DC

## 2014-11-06 NOTE — Telephone Encounter (Signed)
Pt of Dr. [name] requesting refill on insulin glargine (LANTUS) 100 UNIT/ML injection [[id number]]

## 2014-11-06 NOTE — Telephone Encounter (Signed)
Advised pt needs an OV for further refills. Sent in #30 day supply

## 2014-11-07 ENCOUNTER — Encounter: Payer: Self-pay | Admitting: Physical Medicine & Rehabilitation

## 2014-11-07 ENCOUNTER — Encounter: Payer: Self-pay | Admitting: Endocrinology

## 2014-11-07 ENCOUNTER — Other Ambulatory Visit: Payer: Self-pay | Admitting: *Deleted

## 2014-11-07 ENCOUNTER — Telehealth: Payer: Self-pay

## 2014-11-07 ENCOUNTER — Telehealth: Payer: Self-pay | Admitting: Endocrinology

## 2014-11-07 ENCOUNTER — Ambulatory Visit (INDEPENDENT_AMBULATORY_CARE_PROVIDER_SITE_OTHER): Payer: Commercial Managed Care - HMO | Admitting: Endocrinology

## 2014-11-07 VITALS — BP 120/78 | HR 80 | Temp 98.3°F | Resp 14 | Ht 70.0 in | Wt 215.2 lb

## 2014-11-07 DIAGNOSIS — E1065 Type 1 diabetes mellitus with hyperglycemia: Secondary | ICD-10-CM

## 2014-11-07 DIAGNOSIS — IMO0002 Reserved for concepts with insufficient information to code with codable children: Secondary | ICD-10-CM

## 2014-11-07 DIAGNOSIS — E785 Hyperlipidemia, unspecified: Secondary | ICD-10-CM

## 2014-11-07 MED ORDER — INSULIN GLARGINE 100 UNIT/ML SOLOSTAR PEN
70.0000 [IU] | PEN_INJECTOR | Freq: Every day | SUBCUTANEOUS | Status: DC
Start: 2014-11-07 — End: 2015-01-28

## 2014-11-07 MED ORDER — INSULIN ASPART 100 UNIT/ML FLEXPEN: PEN_INJECTOR | SUBCUTANEOUS | Status: DC

## 2014-11-07 NOTE — Telephone Encounter (Signed)
Patient is coming in today at 4:15. °

## 2014-11-07 NOTE — Progress Notes (Signed)
Patient ID: [name] [name], male   DOB: February 09, 1956, 58 y.o.   MRN: [id number]           Reason for Appointment: Followup for Diabetes  Referring physician: Norma Fredrickson PCP: Ermalinda Memos  History of Present Illness:          Diagnosis: Type 2 diabetes mellitus, date of diagnosis: 1995        Past history: He thinks he was started on insulin at the time of diagnosis when he was having significantly high readings and had symptoms of dry mouth and nausea. He has  been on various insulin regimens over the years and generally managed by primary care physicians He thinks that initially his blood sugars were relatively easier to control Most likely has had poor control for the last several years Also in 09/2012 he was admitted to the hospital with diabetic ketoacidosis Until last year he was on Lantus and NovoLog but records of his A1c indicates very poor control in 2014 and also regular followup Because of the cost of Lantus and NovoLog he had on his own been buying the over-the-counter 70/30 insulin from [facility]  Recent history:  On his initial consultation and was felt to have type 1 diabetes with some insulin resistance, baseline A1c was 11% He was restarted on Lantus twice a day with mealtime regular insulin on his last visit  However he has not followed up since August when his A1c was 11% He still has markedly increased blood sugars This is despite his trying to increase his Lantus on his own, previously taking 20 units twice a day Also his blood sugars are higher since he had a steroid injection in his shoulder about 2 days ago Other problems identified:  Checking blood sugar very sporadically  Most of his blood sugars are late at night or at 3-4 AM  Not clear what his fasting blood sugars are  Blood sugars after his evening meals are checked sporadically, mostly around 300 and has only one reading after lunch  Still not exercising enough  Has gained weight since his last  visit  No recent A1c available He thinks he is taking his mealtime insulin as directed before eating Still eating mostly 2 meals a day       Oral hypoglycemic drugs the patient is taking are: None Side effects from medications have been: Metformin causes diarrhea INSULIN regimen is described as:  Lantus 30 units bid 20 bid Novolog Compliance with the medical regimen: poor  Hypoglycemia: none;  with low sugars will have symptoms of feeling shaky, confused     Glucose monitoring:  sporadically        Glucometer: Accucheck       Blood Glucose readings    PRE-MEAL  fasting   PCL   PCS   overnight  Overall  Glucose range: ?    335, 351   to 89-328   142-370    Mean/median:     254  287    Glycemic control:    Lab Results  Component Value Date   HGBA1C 11.0* 06/14/2014   HGBA1C 11.9* 07/21/2013   HGBA1C >14.0 04/06/2013   Lab Results  Component Value Date   MICROALBUR 0.8 08/08/2014   LDLCALC 88 10/17/2014   CREATININE 0.89 08/31/2014   Retinal exam: Most recent: 2 years ago  Self-care: The diet that the patient has been following is: None, recently cutting back on fat intake.      Meals: 3 meals per day.  Breakfast at 11am; is egg; lunch sandwich or salad. Eating out about 4 times a week including at fast food restaurants    Dinner 8 pm      Exercise: Some walking        Dietician visit: Most recent: 8/15 in hospital.               Weight history: No recent weight loss or gain  Wt Readings from Last 3 Encounters:  11/07/14 215 lb 3.2 oz (97.614 kg)  10/19/14 214 lb (97.07 kg)  08/31/14 206 lb 9.6 oz (93.713 kg)       Medication List       This list is accurate as of: 11/07/14 11:59 PM.  Always use your most recent med list.               ACCU-CHEK SOFTCLIX LANCETS lancets  Use as instructed to check blood sugar 3 times per day dx code 250.02     aspirin 81 MG EC tablet  Take 1 tablet (81 mg total) by mouth daily.     atorvastatin 80 MG tablet  Commonly  known as:  LIPITOR  Take 1 tablet (80 mg total) by mouth daily.     buPROPion 300 MG 24 hr tablet  Commonly known as:  WELLBUTRIN XL  Take 1 tablet (300 mg total) by mouth daily.     glucose blood test strip  Commonly known as:  ACCU-CHEK AVIVA PLUS  Use as instructed to check blood sugar 3 times per day dx code 250.02     HYDROcodone-acetaminophen 5-325 MG per tablet  Commonly known as:  NORCO/VICODIN  Take 1 tablet by mouth every 6 (six) hours as needed for moderate pain.     insulin aspart 100 UNIT/ML FlexPen  Commonly known as:  NOVOLOG FLEXPEN  Inject 25 units three times daily     Insulin Glargine 100 UNIT/ML Solostar Pen  Commonly known as:  LANTUS SOLOSTAR  Inject 70 Units into the skin daily at 10 pm.     Insulin Pen Needle 31G X 5 MM Misc  Commonly known as:  B-D UF III MINI PEN NEEDLES  Use to inject insulin 5 pen needles per day     isosorbide mononitrate 30 MG 24 hr tablet  Commonly known as:  IMDUR  Take 1 tablet (30 mg total) by mouth daily.     metoprolol tartrate 25 MG tablet  Commonly known as:  LOPRESSOR  Take 0.5 tablets (12.5 mg total) by mouth 2 (two) times daily.     nitroGLYCERIN 0.4 MG SL tablet  Commonly known as:  NITROSTAT  Place 1 tablet (0.4 mg total) under the tongue every 5 (five) minutes as needed for chest pain (up to 3 doses).     pantoprazole 40 MG tablet  Commonly known as:  PROTONIX  Take 1 tablet (40 mg total) by mouth daily.     prasugrel 10 MG Tabs tablet  Commonly known as:  EFFIENT  Take 1 tablet (10 mg total) by mouth daily.     rOPINIRole 0.5 MG tablet  Commonly known as:  REQUIP  Take 1 tablet (0.5 mg total) by mouth 3 (three) times daily. 1/2 tab 3 times daily for one week, then 1 tab 3 times daily thereafter     terbinafine 250 MG tablet  Commonly known as:  LAMISIL  Take 1 tablet (250 mg total) by mouth daily.     traZODone 50 MG tablet  Commonly known as:  DESYREL  Take 50 mg by mouth at bedtime as needed for  sleep (takes 2 per day).        Allergies: No Known Allergies  Past, surgical, family and social history reviewed again today and no changes needed  Past Medical History  Diagnosis Date  . Diabetes type 2, uncontrolled   . Hypercholesterolemia   . GERD (gastroesophageal reflux disease)   . Hypertension   . Hemorrhoids     hx of  . Peripheral neuropathy   . Depression   . CAD (coronary artery disease)     a. NSTEMI 05/2014 - occluded RCA dominant proximal s/p asp-thrombectomy/DES to RCA, minimal LAD/LCx, EF 50% by cath, 65-70% by echo.  . Barrett's esophagus   . Former tobacco use     Past Surgical History  Procedure Laterality Date  . Tonsillectomy    . Left heart catheterization with coronary angiogram N/A 06/14/2014    Procedure: LEFT HEART CATHETERIZATION WITH CORONARY ANGIOGRAM;  Surgeon: Corky Crafts, MD;  Location: [facility] CATH LAB;  Service: Cardiovascular;  Laterality: N/A;    Family History  Problem Relation Age of Onset  . Hypertension Mother   . Alzheimer's disease Mother   . Alcohol abuse Father   . Cancer Father     "Throat"  . Diabetes type II Brother   . Heart disease Neg Hx     Social History:  reports that he quit smoking about 15 years ago. He has never used smokeless tobacco. He reports that he does not drink alcohol or use illicit drugs.    Review of Systems   Information  of 07/26/14 was updated as follows      Lipids: On treatment on and off for the last 2 years,  started on Lipitor 40 mg after his MI and recent LDL is still over 70       Lab Results  Component Value Date   CHOL 152 10/17/2014   HDL 45.40 10/17/2014   LDLCALC 88 10/17/2014   TRIG 93.0 10/17/2014   CHOLHDL 3 10/17/2014                  The blood pressure had been treated with medications about 10 years ago but is not on any medications now including ACE inhibitor, only on 25 mg metoprolol  Complications from diabetes: Peripheral neuropathy with some sensory loss,  unknown status of nephropathy and retinopathy, needs evaluation   LABS:  No visits with results within 1 Week(s) from this visit. Latest known visit with results is:  Appointment on 10/17/2014  Component Date Value Ref Range Status  . Cholesterol 10/17/2014 152  0 - 200 mg/dL Final   ATP III Classification       Desirable:  < 200 mg/dL               Borderline High:  200 - 239 mg/dL          High:  > = 892 mg/dL  . Triglycerides 10/17/2014 93.0  0.0 - 149.0 mg/dL Final   Normal:  <119 mg/dLBorderline High:  150 - 199 mg/dL  . HDL 10/17/2014 45.40  >39.00 mg/dL Final  . VLDL 41/74/0814 18.6  0.0 - 40.0 mg/dL Final  . LDL Cholesterol 10/17/2014 88  0 - 99 mg/dL Final  . Total CHOL/HDL Ratio 10/17/2014 3   Final                  Men  Women1/2 Average Risk     3.4          3.3Average Risk          5.0          4.42X Average Risk          9.6          7.13X Average Risk          15.0          11.0                      . NonHDL 10/17/2014 106.60   Final   NOTE:  Non-HDL goal should be 30 mg/dL higher than patient's LDL goal (i.e. LDL goal of < 70 mg/dL, would have non-HDL goal of < 100 mg/dL)  . Total Bilirubin 10/17/2014 0.9  0.2 - 1.2 mg/dL Final  . Bilirubin, Direct 10/17/2014 0.0  0.0 - 0.3 mg/dL Final  . Alkaline Phosphatase 10/17/2014 114  39 - 117 U/L Final  . AST 10/17/2014 16  0 - 37 U/L Final  . ALT 10/17/2014 14  0 - 53 U/L Final  . Total Protein 10/17/2014 7.3  6.0 - 8.3 g/dL Final  . Albumin 95/62/1308 4.3  3.5 - 5.2 g/dL Final    Physical Examination:  BP 120/78 mmHg  Pulse 80  Temp(Src) 98.3 F (36.8 C)  Resp 14  Ht 5\' 10"  (1.778 m)  Wt 215 lb 3.2 oz (97.614 kg)  BMI 30.88 kg/m2  SpO2 94%    no peripheral edema  ASSESSMENT/PLAN:    Diabetes type 1, uncontrolled    As discussed in history of present illness he still has poor control of his diabetes despite taking larger doses of insulin Also difficult to get a good blood sugar pattern as he is checking  blood sugars mostly late at night and during the night He does have variable blood sugars overnight probably related to variability in meals and snacks Recent sugars are higher because of getting steroid injection He does not adjust his mealtime dose based on blood sugar level or type of meal he is eating He still need significant amount of diabetes education and information on meal planning, will have him follow-up and nurse educator in 2 weeks Also discussed timing of glucose monitoring and blood sugar targets Check A1c today  New insulin regimen as follows:   Patient Instructions  LANTUS 40 UNITS IN AM AND 30 IN PM Go up 5 units if a) supper reading over 200 for am dose and b) breakfast reading over 200 for pm dose  Novolog 25 while effect of cortisone present then:  NOVOLOG 20 UNITS BEFORE MEALS AND GO UP if sugar high:  Sugar over 160 add 3 units, over 200 add 5 units, over 250 add 8 units and over 300 add 12 units     Counseling time over 50% of today's 25 minute visit   Reylene Stauder 11/08/2014, 9:03 PM   Note: This office note was prepared with Insurance underwriter. Any transcriptional errors that result from this process are unintentional.

## 2014-11-07 NOTE — Patient Instructions (Signed)
LANTUS 40 UNITS IN AM AND 30 IN PM Go up 5 units if a) supper reading over 200 for am dose and b) breakfast reading over 200 for pm dose  Novolog 25 while effect of cortisone present then:  NOVOLOG 20 UNITS BEFORE MEALS AND GO UP if sugar high:  Sugar over 160 add 3 units, over 200 add 5 units, over 250 add 8 units and over 300 add 12 units

## 2014-11-07 NOTE — Telephone Encounter (Signed)
Pharmacy called and stated that when they try to run Lantus through ins, it is saying too soon to fill. Pt has reported to them that his dosage has increased and he is taking the Lantus up to 3 times/day. I see nothing specific about dosage in last OV notes and our med list is for just Qhs dosage. Since notes state that pt is followed closely by Dr Lucianne Muss, and pharm verified that he is Rxing pt's Novolog, I had pharm cancel this Rx and pt is to contact Dr Remus Blake office, as he should be managing this medication.

## 2014-11-07 NOTE — Telephone Encounter (Signed)
Patient states he needs a refill on his lantus he is completely   He will be going out of town; till Monday    [facility] [address]    Thank you

## 2014-11-08 ENCOUNTER — Encounter (HOSPITAL_COMMUNITY): Payer: Self-pay | Admitting: Interventional Cardiology

## 2014-11-09 ENCOUNTER — Other Ambulatory Visit: Payer: Commercial Managed Care - HMO

## 2014-11-09 ENCOUNTER — Other Ambulatory Visit (INDEPENDENT_AMBULATORY_CARE_PROVIDER_SITE_OTHER): Payer: Commercial Managed Care - HMO

## 2014-11-09 ENCOUNTER — Other Ambulatory Visit: Payer: Self-pay | Admitting: *Deleted

## 2014-11-09 DIAGNOSIS — E1165 Type 2 diabetes mellitus with hyperglycemia: Secondary | ICD-10-CM

## 2014-11-09 DIAGNOSIS — IMO0002 Reserved for concepts with insufficient information to code with codable children: Secondary | ICD-10-CM

## 2014-11-09 LAB — HEMOGLOBIN A1C: Hgb A1c MFr Bld: 11.3 % — ABNORMAL HIGH (ref 4.6–6.5)

## 2014-11-19 ENCOUNTER — Other Ambulatory Visit: Payer: Self-pay | Admitting: *Deleted

## 2014-11-19 ENCOUNTER — Encounter: Payer: Commercial Managed Care - HMO | Attending: Endocrinology | Admitting: Nutrition

## 2014-11-19 DIAGNOSIS — Z794 Long term (current) use of insulin: Secondary | ICD-10-CM | POA: Insufficient documentation

## 2014-11-19 DIAGNOSIS — E119 Type 2 diabetes mellitus without complications: Secondary | ICD-10-CM | POA: Diagnosis not present

## 2014-11-19 DIAGNOSIS — IMO0002 Reserved for concepts with insufficient information to code with codable children: Secondary | ICD-10-CM

## 2014-11-19 DIAGNOSIS — E1165 Type 2 diabetes mellitus with hyperglycemia: Secondary | ICD-10-CM

## 2014-11-19 DIAGNOSIS — Z713 Dietary counseling and surveillance: Secondary | ICD-10-CM | POA: Diagnosis not present

## 2014-11-19 MED ORDER — GLUCOSE BLOOD VI STRP: ORAL_STRIP | Status: DC

## 2014-11-20 NOTE — Patient Instructions (Signed)
1.  Take Lantus: 40u in AM, and 30u in the evening. 2.  Test blood sugars before breakfast and supper and call results in 1 week. 3.  Test before meals and take Novolog dose:  20u Plus: 3u if blood sugar is over 160,  5u if blood sugar is over 200, and 12u of blood sugar is over 300 4.  Take insulin before eating at [facility]

## 2014-11-20 NOTE — Progress Notes (Signed)
Patient did not bring meter to visit. Typical day: 8AM: cereal, or english muffin with jelly, or oatmeal         Takes 30u Lantus, and 15-20u Novolog 1-2PM: Says blood sugars are usually 200-230,  Usually eats no lunch 4PM: picks up grandaughter from school and goes to [employer] and has a sausage/egg biscuit with 1/2 and 1/2 sweet tea.         Take s 20u Novolog 8PM: Salsd with chicken, or cobb salad with blue cheese dressing.           Takes 20 nvoloog and 30 Lantus 10-12AM: cheese and crackers or nuts            Blood sugars at HS (1AM-2AM): 200-300  Problems identified: 1. Taking wrong dose of Lantus. 2.  Not doing a correction dose of Novolog when blood sugars are high 3.  Drinking sweet tea 4. Not testing blood sugars as needed.

## 2014-11-28 NOTE — Telephone Encounter (Signed)
error 

## 2014-12-05 ENCOUNTER — Ambulatory Visit: Payer: Commercial Managed Care - HMO | Admitting: Endocrinology

## 2014-12-11 ENCOUNTER — Other Ambulatory Visit: Payer: Self-pay | Admitting: Orthopedic Surgery

## 2014-12-11 DIAGNOSIS — M542 Cervicalgia: Secondary | ICD-10-CM

## 2014-12-13 ENCOUNTER — Encounter: Payer: Commercial Managed Care - HMO | Attending: Physical Medicine & Rehabilitation

## 2014-12-13 ENCOUNTER — Ambulatory Visit: Payer: Commercial Managed Care - HMO | Admitting: Physical Medicine & Rehabilitation

## 2014-12-15 ENCOUNTER — Other Ambulatory Visit: Payer: Self-pay | Admitting: Family Medicine

## 2014-12-16 ENCOUNTER — Inpatient Hospital Stay: Admission: RE | Admit: 2014-12-16 | Payer: Commercial Managed Care - HMO | Source: Ambulatory Visit

## 2014-12-25 ENCOUNTER — Other Ambulatory Visit: Payer: Self-pay | Admitting: Family Medicine

## 2015-01-08 LAB — HM DIABETES EYE EXAM

## 2015-01-10 ENCOUNTER — Encounter: Payer: Self-pay | Admitting: Family Medicine

## 2015-01-10 DIAGNOSIS — IMO0002 Reserved for concepts with insufficient information to code with codable children: Secondary | ICD-10-CM

## 2015-01-10 DIAGNOSIS — E1165 Type 2 diabetes mellitus with hyperglycemia: Secondary | ICD-10-CM

## 2015-01-20 ENCOUNTER — Other Ambulatory Visit: Payer: Commercial Managed Care - HMO

## 2015-01-28 ENCOUNTER — Other Ambulatory Visit: Payer: Self-pay | Admitting: Endocrinology

## 2015-02-03 ENCOUNTER — Ambulatory Visit
Admission: RE | Admit: 2015-02-03 | Discharge: 2015-02-03 | Disposition: A | Discharge status: Home / Self Care | Payer: Commercial Managed Care - HMO | Source: Ambulatory Visit | Attending: Orthopedic Surgery | Admitting: Orthopedic Surgery

## 2015-02-03 DIAGNOSIS — M542 Cervicalgia: Secondary | ICD-10-CM

## 2015-02-05 ENCOUNTER — Encounter: Payer: Self-pay | Admitting: Interventional Cardiology

## 2015-02-05 ENCOUNTER — Ambulatory Visit (INDEPENDENT_AMBULATORY_CARE_PROVIDER_SITE_OTHER): Payer: Commercial Managed Care - HMO | Admitting: Interventional Cardiology

## 2015-02-05 ENCOUNTER — Other Ambulatory Visit: Payer: Commercial Managed Care - HMO

## 2015-02-05 VITALS — BP 120/76 | HR 98 | Ht 70.0 in | Wt 214.0 lb

## 2015-02-05 DIAGNOSIS — E785 Hyperlipidemia, unspecified: Secondary | ICD-10-CM

## 2015-02-05 DIAGNOSIS — E1159 Type 2 diabetes mellitus with other circulatory complications: Secondary | ICD-10-CM

## 2015-02-05 DIAGNOSIS — E119 Type 2 diabetes mellitus without complications: Secondary | ICD-10-CM | POA: Insufficient documentation

## 2015-02-05 DIAGNOSIS — I2510 Atherosclerotic heart disease of native coronary artery without angina pectoris: Secondary | ICD-10-CM

## 2015-02-05 DIAGNOSIS — I252 Old myocardial infarction: Secondary | ICD-10-CM

## 2015-02-05 LAB — LIPID PANEL
Cholesterol: 251 mg/dL — ABNORMAL HIGH (ref 0–200)
HDL: 48.2 mg/dL (ref >39.00)
LDL CALC: 169 mg/dL — AB (ref 0–99)
NONHDL: 202.8
Total CHOL/HDL Ratio: 5
Triglycerides: 167 mg/dL — ABNORMAL HIGH (ref 0.0–149.0)
VLDL: 33.4 mg/dL (ref 0.0–40.0)

## 2015-02-05 MED ORDER — LISINOPRIL 2.5 MG PO TABS: 2.5000 mg | ORAL_TABLET | Freq: Every day | ORAL | Status: DC

## 2015-02-05 MED ORDER — CLOPIDOGREL BISULFATE 75 MG PO TABS: 75.0000 mg | ORAL_TABLET | Freq: Every day | ORAL | Status: DC

## 2015-02-05 NOTE — Progress Notes (Signed)
Patient ID: [name] [name], male   DOB: 1956/05/16, 59 y.o.   MRN: [id number] Patient ID: [name] [name], male   DOB: [birth year], 59 y.o.   MRN: [id number]    [address] [place], [place]  [address] Phone: [phone] Fax:  [phone]  Date:  02/05/2015   ID:  Hatcher, Froning Apr 06, 1956, MRN [id number]  PCP:  Norberto Sorenson, MD      History of Present Illness: [name] [name] is a 59 y.o. male who had an inferior MI in July 2015.  He had a DES to the RCA placed.  No more chest pains; has occasional back pain in the center- this is different from the heart attack pain.  He has tried NTG since the last visit with the back pain because it came to the chest.  The pain did resolve.  When he had his MI, his CP persisted.     His most strenuous activity is walking.  He will start yard work soon.  No problems while mowing the lawn in 2015.  He is in school at [employer] and has to walk a lot on the campus.  He is studying IT/business mgmt.  Twice a week, he goes for a walk.     Wt Readings from Last 3 Encounters:  02/05/15 214 lb (97.07 kg)  02/03/15 215 lb (97.523 kg)  11/07/14 215 lb 3.2 oz (97.614 kg)     Past Medical History  Diagnosis Date  . Diabetes type 2, uncontrolled   . Hypercholesterolemia   . GERD (gastroesophageal reflux disease)   . Hypertension   . Hemorrhoids     hx of  . Peripheral neuropathy   . Depression   . CAD (coronary artery disease)     a. NSTEMI 05/2014 - occluded RCA dominant proximal s/p asp-thrombectomy/DES to RCA, minimal LAD/LCx, EF 50% by cath, 65-70% by echo.  . Barrett's esophagus   . Former tobacco use     Current Outpatient Prescriptions  Medication Sig Dispense Refill  . ACCU-CHEK SOFTCLIX LANCETS lancets Use as instructed to check blood sugar 3 times per day dx code 250.02 100 each 2  . aspirin EC 81 MG EC tablet Take 1 tablet (81 mg total) by mouth daily. 30 tablet 11  . atorvastatin (LIPITOR) 40 MG tablet Take 1 tablet by mouth daily.     Marland Kitchen buPROPion (WELLBUTRIN XL) 300 MG 24 hr tablet Take 1 tablet (300 mg total) by mouth daily. 90 tablet 3  . glucose blood (ACCU-CHEK AVIVA PLUS) test strip Use as instructed to check blood sugar 3 times per day dx code E11.59 100 each 2  . HYDROcodone-acetaminophen (NORCO/VICODIN) 5-325 MG per tablet Take 1 tablet by mouth every 6 (six) hours as needed for moderate pain.    Marland Kitchen insulin aspart (NOVOLOG FLEXPEN) 100 UNIT/ML FlexPen Inject 25 units three times daily 30 mL 2  . Insulin Pen Needle (B-D UF III MINI PEN NEEDLES) 31G X 5 MM MISC Use to inject insulin 5 pen needles per day 150 each 2  . isosorbide mononitrate (IMDUR) 30 MG 24 hr tablet Take 1 tablet (30 mg total) by mouth daily. 90 tablet 3  . LANTUS SOLOSTAR 100 UNIT/ML Solostar Pen INJECT 70 UNITS INTO THE SKIN ONCE DAILY AT 10PM 15 pen 0  . metoprolol tartrate (LOPRESSOR) 25 MG tablet Take 0.5 tablets (12.5 mg total) by mouth 2 (two) times daily. 60 tablet 6  . nitroGLYCERIN (NITROSTAT) 0.4 MG SL tablet  Place 1 tablet (0.4 mg total) under the tongue every 5 (five) minutes as needed for chest pain (up to 3 doses). 25 tablet 3  . pantoprazole (PROTONIX) 40 MG tablet Take 1 tablet (40 mg total) by mouth daily. 90 tablet 1  . prasugrel (EFFIENT) 10 MG TABS tablet Take 1 tablet (10 mg total) by mouth daily. 30 tablet 11  . terbinafine (LAMISIL) 250 MG tablet Take 1 tablet (250 mg total) by mouth daily. 90 tablet 1  . traZODone (DESYREL) 50 MG tablet Take 50 mg by mouth at bedtime as needed for sleep (takes 2 per day).      No current facility-administered medications for this visit.    Allergies:   No Known Allergies  Social History:  The patient  reports that he quit smoking about 16 years ago. He has never used smokeless tobacco. He reports that he does not drink alcohol or use illicit drugs.   Family History:  The patient's family history includes Alcohol abuse in his father; Alzheimer's disease in his mother; Cancer in his father;  Diabetes type II in his brother; Hypertension in his mother. There is no history of Heart disease.   ROS:  Please see the history of present illness.  No nausea, vomiting.  No fevers, chills.  No focal weakness.  No dysuria.    All other systems reviewed and negative.   PHYSICAL EXAM: VS:  BP 120/76 mmHg  Pulse 98  Ht 5\' 10"  (1.778 m)  Wt 214 lb (97.07 kg)  BMI 30.71 kg/m2  SpO2 96% Well nourished, well developed, in no acute distress HEENT: normal Neck: no JVD, no carotid bruits Cardiac:  normal S1, S2; RRR;  Lungs:  clear to auscultation bilaterally, no wheezing, rhonchi or rales Abd: soft, nontender, no hepatomegaly Ext: no edema, 2+ DP pulses bilaterally Skin: warm and dry Neuro:   no focal abnormalities noted Psych: normal affect      ASSESSMENT AND PLAN:  1. CAD: s/p DES to RCA in 7/15.  Stressed importance fo DAPT to prevent stent thrombosis. No bleeding issues.  Would switch to clopidogrel after one year of Effient, which will be July-August 2016.  2. Old MI: Inferior MI.  EF 50% a time of cath.  No CHF sx.   3. Hyperlipidemia:  COntinue atorvastatin.  LDL 88 at the time of the hospital in 7/15.  Labs were done with his endocrinologist, Dr. Lucianne Muss. Checked lipids in 11/15; was on lipitor , LDL 88 in 11/15.  Will see if he is now closer to LDL 70.  Recheck today.  4. Start ACE-I since BP higher given Diabetes.  At this time, BP should tolerate adding medicine and lower renal and cardiovascular risk. Start lisinopril 2.5 mg.  Check BP at home.  BMet in one week.  5. Diabetes: Followed by endocrine.  Signed, Fredric Mare, MD, [facility] 02/05/2015 10:29 AM

## 2015-02-05 NOTE — Patient Instructions (Signed)
Your physician has recommended you make the following change in your medication:  1) START taking Lisinopril 2.5mg  daily 2) START taking Plavix 75mg  daily IN AUGUST 3) STOP taking Effient when you start taking Plavix  Your physician recommends that you have lab work today (Lipids)  Your physician recommends that you return for lab work in: 1 week (CMET)  Your physician wants you to follow-up in: 6 months with Dr. Eldridge Dace.  You will receive a reminder letter in the mail two months in advance. If you don't receive a letter, please call our office to schedule the follow-up appointment.

## 2015-02-06 ENCOUNTER — Telehealth: Payer: Self-pay | Admitting: *Deleted

## 2015-02-06 DIAGNOSIS — E785 Hyperlipidemia, unspecified: Secondary | ICD-10-CM

## 2015-02-06 MED ORDER — ATORVASTATIN CALCIUM 80 MG PO TABS: 80.0000 mg | ORAL_TABLET | Freq: Every day | ORAL | Status: DC

## 2015-02-06 NOTE — Telephone Encounter (Signed)
-----   Message from Corky Crafts, MD sent at 02/06/2015  6:00 AM EST ----- Increase atorvastatin to 80 mg daily. Recheck lipids and liver in 3 months

## 2015-02-06 NOTE — Telephone Encounter (Signed)
Spoke with pt and made him aware of lab results. Informed pt of new order to increase Atorvastatin to 80mg  daily and to recheck lipids and liver in 3 months. Appt made for 05/08/15. Verified pharmacy and informed pt that I would send prescription over for pick up. Pt verbalized understanding and was in agreement with this plan.

## 2015-02-12 ENCOUNTER — Other Ambulatory Visit (INDEPENDENT_AMBULATORY_CARE_PROVIDER_SITE_OTHER): Payer: Commercial Managed Care - HMO | Admitting: *Deleted

## 2015-02-12 DIAGNOSIS — I2510 Atherosclerotic heart disease of native coronary artery without angina pectoris: Secondary | ICD-10-CM

## 2015-02-12 DIAGNOSIS — E785 Hyperlipidemia, unspecified: Secondary | ICD-10-CM

## 2015-02-12 LAB — COMPREHENSIVE METABOLIC PANEL
ALK PHOS: 88 U/L (ref 39–117)
ALT: 13 U/L (ref 0–53)
AST: 18 U/L (ref 0–37)
Albumin: 4.1 g/dL (ref 3.5–5.2)
BILIRUBIN TOTAL: 0.3 mg/dL (ref 0.2–1.2)
BUN: 12 mg/dL (ref 6–23)
CALCIUM: 8.9 mg/dL (ref 8.4–10.5)
CO2: 26 meq/L (ref 19–32)
Chloride: 107 mEq/L (ref 96–112)
Creatinine, Ser: 0.85 mg/dL (ref 0.40–1.50)
GFR: 119.62 mL/min (ref >60.00)
GLUCOSE: 236 mg/dL — AB (ref 70–99)
Potassium: 4.3 mEq/L (ref 3.5–5.1)
Sodium: 135 mEq/L (ref 135–145)
Total Protein: 6.7 g/dL (ref 6.0–8.3)

## 2015-02-20 ENCOUNTER — Other Ambulatory Visit: Payer: Self-pay | Admitting: Endocrinology

## 2015-04-10 ENCOUNTER — Telehealth: Payer: Self-pay

## 2015-04-10 NOTE — Telephone Encounter (Signed)
Fax received from [facility] for cardiac clearance and if pt needs to adjust Effient.

## 2015-04-15 ENCOUNTER — Telehealth: Payer: Self-pay

## 2015-04-15 MED ORDER — INSULIN ASPART 100 UNIT/ML FLEXPEN: PEN_INJECTOR | SUBCUTANEOUS | Status: DC

## 2015-04-15 NOTE — Telephone Encounter (Signed)
Meds ordered this encounter  Medications  . insulin aspart (NOVOLOG FLEXPEN) 100 UNIT/ML FlexPen    Sig: Inject 25 units three times daily    Dispense:  30 mL    Refill:  0   Please let this patient know that he needs an OV and labs.

## 2015-04-15 NOTE — Telephone Encounter (Signed)
Patient is out of lantus. Needs it before he sees Dr. [name] on Friday.   [facility] [address]   [phone]

## 2015-04-15 NOTE — Telephone Encounter (Signed)
Patient is out of lantus. Needs it before he sees Dr. Shaw on Friday.  ° °[facility] [place]  ° °[phone] °

## 2015-04-15 NOTE — Telephone Encounter (Signed)
Can we send in since he has an appt?

## 2015-04-17 MED ORDER — PANTOPRAZOLE SODIUM 40 MG PO TBEC: 40.0000 mg | DELAYED_RELEASE_TABLET | Freq: Every day | ORAL | Status: DC

## 2015-04-17 NOTE — Telephone Encounter (Signed)
PATIENT STATES HE CALLED ON Monday REQUESTING A REFILL ON HIS LANTUS AND HIS PROTONIX. HE WOULD LIKE TO KNOW IF IT HAS BEEN SENT TO HIS PHARMACY YET? BEST PHONE [phone] (CELL)  PHARMACY CHOICE IS [facility] [address]  MBC

## 2015-04-17 NOTE — Telephone Encounter (Signed)
Left message letting him know

## 2015-04-19 ENCOUNTER — Encounter: Payer: Self-pay | Admitting: Family Medicine

## 2015-04-19 ENCOUNTER — Ambulatory Visit (INDEPENDENT_AMBULATORY_CARE_PROVIDER_SITE_OTHER): Payer: Commercial Managed Care - HMO | Admitting: Family Medicine

## 2015-04-19 VITALS — BP 124/79 | HR 96 | Temp 98.6°F | Resp 16 | Ht 70.5 in | Wt 210.0 lb

## 2015-04-19 DIAGNOSIS — Z794 Long term (current) use of insulin: Secondary | ICD-10-CM

## 2015-04-19 DIAGNOSIS — E108 Type 1 diabetes mellitus with unspecified complications: Secondary | ICD-10-CM | POA: Diagnosis not present

## 2015-04-19 DIAGNOSIS — G629 Polyneuropathy, unspecified: Secondary | ICD-10-CM

## 2015-04-19 DIAGNOSIS — E1151 Type 2 diabetes mellitus with diabetic peripheral angiopathy without gangrene: Secondary | ICD-10-CM

## 2015-04-19 DIAGNOSIS — F329 Major depressive disorder, single episode, unspecified: Secondary | ICD-10-CM | POA: Diagnosis not present

## 2015-04-19 DIAGNOSIS — E785 Hyperlipidemia, unspecified: Secondary | ICD-10-CM | POA: Diagnosis not present

## 2015-04-19 DIAGNOSIS — IMO0001 Reserved for inherently not codable concepts without codable children: Secondary | ICD-10-CM

## 2015-04-19 DIAGNOSIS — E1159 Type 2 diabetes mellitus with other circulatory complications: Secondary | ICD-10-CM

## 2015-04-19 DIAGNOSIS — F32A Depression, unspecified: Secondary | ICD-10-CM

## 2015-04-19 DIAGNOSIS — I739 Peripheral vascular disease, unspecified: Secondary | ICD-10-CM

## 2015-04-19 DIAGNOSIS — M25512 Pain in left shoulder: Secondary | ICD-10-CM | POA: Diagnosis not present

## 2015-04-19 DIAGNOSIS — E118 Type 2 diabetes mellitus with unspecified complications: Secondary | ICD-10-CM

## 2015-04-19 DIAGNOSIS — I2510 Atherosclerotic heart disease of native coronary artery without angina pectoris: Secondary | ICD-10-CM

## 2015-04-19 DIAGNOSIS — G8929 Other chronic pain: Secondary | ICD-10-CM

## 2015-04-19 DIAGNOSIS — Z79899 Other long term (current) drug therapy: Secondary | ICD-10-CM | POA: Diagnosis not present

## 2015-04-19 DIAGNOSIS — IMO0002 Reserved for concepts with insufficient information to code with codable children: Secondary | ICD-10-CM

## 2015-04-19 DIAGNOSIS — E1165 Type 2 diabetes mellitus with hyperglycemia: Secondary | ICD-10-CM

## 2015-04-19 LAB — COMPREHENSIVE METABOLIC PANEL
ALBUMIN: 3.9 g/dL (ref 3.5–5.2)
ALT: 11 U/L (ref 0–53)
AST: 14 U/L (ref 0–37)
Alkaline Phosphatase: 112 U/L (ref 39–117)
BUN: 20 mg/dL (ref 6–23)
CO2: 23 meq/L (ref 19–32)
Calcium: 9.3 mg/dL (ref 8.4–10.5)
Chloride: 102 mEq/L (ref 96–112)
Creat: 0.79 mg/dL (ref 0.50–1.35)
Glucose, Bld: 217 mg/dL — ABNORMAL HIGH (ref 70–99)
POTASSIUM: 4.6 meq/L (ref 3.5–5.3)
Sodium: 133 mEq/L — ABNORMAL LOW (ref 135–145)
Total Bilirubin: 0.4 mg/dL (ref 0.2–1.2)
Total Protein: 6.9 g/dL (ref 6.0–8.3)

## 2015-04-19 MED ORDER — INSULIN GLARGINE 100 UNIT/ML SOLOSTAR PEN: 70.0000 [IU] | PEN_INJECTOR | Freq: Every day | SUBCUTANEOUS | Status: DC

## 2015-04-19 MED ORDER — INSULIN ASPART 100 UNIT/ML FLEXPEN: PEN_INJECTOR | SUBCUTANEOUS | Status: DC

## 2015-04-19 NOTE — Progress Notes (Addendum)
Subjective:    Patient ID: [name] [name], male    DOB: 1956-03-17, 59 y.o.   MRN: [id number] This chart was scribed for Sherren Mocha, MD by Leona Carry, ED Scribe. The patient's care was started at 11:37 AM.   Chief Complaint  Patient presents with  . Follow-up  . Diabetes    HPI [name] [name] is a 59 y.o. Male. He is an insulin-dependent diabetic. Recently followed by endocrinology. He was not involved in medical care for an extended period of time until he had an MI, which hospitalized him and got him connected with specialist, and tied him back into the health care system. He has not seen Dr. Lucianne Muss, endocrinology, in 5 months. He switched to OTC insulin due to costs. Patient was being poorly compliant with diet, blood and glucose monitoring, and insulin. He was supposed to return for a re-check within one month.  Cardiology:  Patient saw Dr. Eldridge Dace, cardiology, 2.5 months ago. He was having some trouble complying with his anti-platelet medications. Ejection fraction at that time was 50%. Dr. Eldridge Dace restarted him on Lisinopril 2.5 mg, recommending that he check his BP at home and recheck his renal function within one week. He did, and his renal pressure was stable and his sugar was elevated. He is due to have a recheck on lipids and liver function within the next two weeks, as Dr. Eldridge Dace increased his Atrovastatin from 40 mg to 80 mg 2 months ago. LDL was 170. GOL less than 170.   Patient denies CP currently. He reports one episode of CP recently that occurred during a stressful week. He took nitroglycerin, which alleviated the pain. He has never taken the nitroglycerin when he was not experiencing CP.   Diabetes: He reports that he has been out of Lantus for approximately 1 week, so he has been using Novolog since then. Patient has checked his blood sugar levels frequently lately. He reports one episode of low blood sugar in the past two weeks when awoke in the middle of the night  with a blood sugar level of 72. He states that he has not been measuring his blood pressure frequently at home, although he does own a blood pressure cuff. Patient has been prescribed Plavix, but has not used it. He reports that he has used Effient.   He reports that he has been sleeping, but does not wake up feeling rested. He believes this may have been related to the stress of finals week at school (he graduated from [employer] last week). He is not having trouble falling asleep. He reports going to bed at approximately 4:00 am and waking up around 10:00 am.  He denies using trazodone to help sleep. He denies anxiety.  He reports that he has a lidocaine shot into his right hip performed by Dr. Larrie Kass at [facility] and [facility].   Past Medical History  Diagnosis Date  . Diabetes type 2, uncontrolled   . Hypercholesterolemia   . GERD (gastroesophageal reflux disease)   . Hypertension   . Hemorrhoids     hx of  . Peripheral neuropathy   . Depression   . CAD (coronary artery disease)     a. NSTEMI 05/2014 - occluded RCA dominant proximal s/p asp-thrombectomy/DES to RCA, minimal LAD/LCx, EF 50% by cath, 65-70% by echo.  . Barrett's esophagus   . Former tobacco use    Current Outpatient Prescriptions on File Prior to Visit  Medication Sig Dispense Refill  .  ACCU-CHEK SOFTCLIX LANCETS lancets Use as instructed to check blood sugar 3 times per day dx code 250.02 100 each 2  . aspirin EC 81 MG EC tablet Take 1 tablet (81 mg total) by mouth daily. 30 tablet 11  . atorvastatin (LIPITOR) 80 MG tablet Take 1 tablet (80 mg total) by mouth daily. 90 tablet 3  . buPROPion (WELLBUTRIN XL) 300 MG 24 hr tablet Take 1 tablet (300 mg total) by mouth daily. 90 tablet 3  . glucose blood (ACCU-CHEK AVIVA PLUS) test strip Use as instructed to check blood sugar 3 times per day dx code E11.59 100 each 2  . HYDROcodone-acetaminophen (NORCO/VICODIN) 5-325 MG per tablet Take 1 tablet by mouth every 6 (six) hours as  needed for moderate pain.    Marland Kitchen insulin aspart (NOVOLOG FLEXPEN) 100 UNIT/ML FlexPen Inject 25 units three times daily 30 mL 0  . Insulin Pen Needle (B-D UF III MINI PEN NEEDLES) 31G X 5 MM MISC Use to inject insulin 5 pen needles per day 150 each 2  . isosorbide mononitrate (IMDUR) 30 MG 24 hr tablet Take 1 tablet (30 mg total) by mouth daily. 90 tablet 3  . LANTUS SOLOSTAR 100 UNIT/ML Solostar Pen INJECT 70 UNITS SUBCUTANEOUSLY ONCE DAILY AT 10 PM 15 pen 0  . lisinopril (PRINIVIL,ZESTRIL) 2.5 MG tablet Take 1 tablet (2.5 mg total) by mouth daily. 30 tablet 6  . metoprolol tartrate (LOPRESSOR) 25 MG tablet Take 0.5 tablets (12.5 mg total) by mouth 2 (two) times daily. 60 tablet 6  . nitroGLYCERIN (NITROSTAT) 0.4 MG SL tablet Place 1 tablet (0.4 mg total) under the tongue every 5 (five) minutes as needed for chest pain (up to 3 doses). 25 tablet 3  . pantoprazole (PROTONIX) 40 MG tablet Take 1 tablet (40 mg total) by mouth daily. 30 tablet 0  . prasugrel (EFFIENT) 10 MG TABS tablet Take 1 tablet (10 mg total) by mouth daily. 30 tablet 11  . terbinafine (LAMISIL) 250 MG tablet Take 1 tablet (250 mg total) by mouth daily. 90 tablet 1  . traZODone (DESYREL) 50 MG tablet Take 50 mg by mouth at bedtime as needed for sleep (takes 2 per day).      No current facility-administered medications on file prior to visit.   No Known Allergies    Review of Systems  Constitutional: Positive for fatigue. Negative for fever, chills, activity change, appetite change and unexpected weight change.  Eyes: Negative for visual disturbance.  Respiratory: Negative for shortness of breath.   Cardiovascular: Negative for chest pain and leg swelling.  Gastrointestinal: Negative for vomiting.  Endocrine: Negative for polydipsia, polyphagia and polyuria.  Musculoskeletal: Positive for myalgias, back pain and arthralgias. Negative for joint swelling and gait problem.  Neurological: Negative for dizziness, syncope, facial  asymmetry, weakness, light-headedness and headaches.  Psychiatric/Behavioral: Positive for sleep disturbance, dysphoric mood and decreased concentration. Negative for behavioral problems and self-injury. The patient is nervous/anxious.        Objective:  Physical Exam  Constitutional: He is oriented to person, place, and time. He appears well-developed and well-nourished. No distress.  HENT:  Head: Normocephalic and atraumatic.  Eyes: Conjunctivae and EOM are normal.  Neck: Neck supple. No tracheal deviation present.  Cardiovascular: Regular rhythm and normal heart sounds.   No murmur heard. Tachycardic.  Pulmonary/Chest: Effort normal and breath sounds normal. No respiratory distress. He has no wheezes. He has no rales.  Musculoskeletal: Normal range of motion.  Neurological: He is alert and oriented  to person, place, and time.  Skin: Skin is warm and dry.  Psychiatric: He has a normal mood and affect. His behavior is normal.  Nursing note and vitals reviewed.   Filed Vitals:   04/19/15 1038  BP: 124/79  Pulse: 96  Temp: 98.6 F (37 C)  Resp: 16           Assessment & Plan:   1. Polypharmacy   2. Coronary artery disease involving native coronary artery of native heart without angina pectoris   3. Uncontrolled diabetes mellitus type 2 with peripheral artery disease   4. Insulin dependent diabetes mellitus with complications - see endocrine for management - Dr. Lucianne Muss.  Pt has been out of lantus so using much more novolog then prev rxed and so now is out of that as well and pharmacy won't refill so changed sig so can have an early fill.  5. Peripheral neuropathy   6. Chronic pain in left shoulder - pt requests pain management referral, not to [facility] pain management  7. Depression - seeing at [facility] through telemed visits  8. Hyperlipidemia - goal LDL <70 needs flp at next OV.     Orders Placed This Encounter  Procedures  . Comprehensive metabolic panel    Meds  ordered this encounter  Medications  . insulin aspart (NOVOLOG FLEXPEN) 100 UNIT/ML FlexPen    Sig: Inject 25 units three times daily    Dispense:  30 mL    Refill:  0  . Insulin Glargine (LANTUS SOLOSTAR) 100 UNIT/ML Solostar Pen    Sig: Inject 70 Units into the skin daily at 10 pm.    Dispense:  15 pen    Refill:  0    One month refill only, patient needs appointment    I personally performed the services described in this documentation, which was scribed in my presence. The recorded information has been reviewed and considered, and addended by me as needed.  Norberto Sorenson, MD MPH

## 2015-05-08 ENCOUNTER — Other Ambulatory Visit (INDEPENDENT_AMBULATORY_CARE_PROVIDER_SITE_OTHER): Payer: Commercial Managed Care - HMO | Admitting: *Deleted

## 2015-05-08 DIAGNOSIS — E785 Hyperlipidemia, unspecified: Secondary | ICD-10-CM | POA: Diagnosis not present

## 2015-05-08 LAB — HEPATIC FUNCTION PANEL
ALT: 19 U/L (ref 0–53)
AST: 20 U/L (ref 0–37)
Albumin: 4 g/dL (ref 3.5–5.2)
Alkaline Phosphatase: 126 U/L — ABNORMAL HIGH (ref 39–117)
Bilirubin, Direct: 0.1 mg/dL (ref 0.0–0.3)
Total Bilirubin: 0.7 mg/dL (ref 0.2–1.2)
Total Protein: 6.7 g/dL (ref 6.0–8.3)

## 2015-05-08 LAB — LIPID PANEL
CHOL/HDL RATIO: 4
Cholesterol: 207 mg/dL — ABNORMAL HIGH (ref 0–200)
HDL: 52.3 mg/dL (ref >39.00)
LDL Cholesterol: 123 mg/dL — ABNORMAL HIGH (ref 0–99)
NonHDL: 154.7
Triglycerides: 157 mg/dL — ABNORMAL HIGH (ref 0.0–149.0)
VLDL: 31.4 mg/dL (ref 0.0–40.0)

## 2015-05-16 ENCOUNTER — Encounter: Payer: Self-pay | Admitting: *Deleted

## 2015-05-18 ENCOUNTER — Other Ambulatory Visit: Payer: Self-pay | Admitting: Physician Assistant

## 2015-05-21 ENCOUNTER — Telehealth: Payer: Self-pay | Admitting: Interventional Cardiology

## 2015-05-21 DIAGNOSIS — E785 Hyperlipidemia, unspecified: Secondary | ICD-10-CM

## 2015-05-21 MED ORDER — EZETIMIBE 10 MG PO TABS: 10.0000 mg | ORAL_TABLET | Freq: Every day | ORAL | Status: DC

## 2015-05-21 NOTE — Telephone Encounter (Signed)
Notes Recorded by Corky Crafts, MD on 05/10/2015 at 8:43 AM LDL still above target.  If he has been taking atorva 80 daily, then add Zetia 10 mg daily. Recheck lipids, liver in 3 months.  Pt received letter and called office. Informed pt of lab results and pt states that he has been taking Atorvastatin 80mg  QD. Informed pt of new order for Zetia and to recheck labs in 3 months. Verified pharmacy and made appt for labs. Pt verbalized understanding and was in agreement with this plan.

## 2015-05-22 ENCOUNTER — Other Ambulatory Visit: Payer: Self-pay | Admitting: Physician Assistant

## 2015-05-22 ENCOUNTER — Other Ambulatory Visit: Payer: Self-pay | Admitting: Family Medicine

## 2015-05-30 ENCOUNTER — Encounter (HOSPITAL_COMMUNITY): Payer: Self-pay | Admitting: Emergency Medicine

## 2015-05-30 ENCOUNTER — Other Ambulatory Visit: Payer: Self-pay | Admitting: Family Medicine

## 2015-05-30 ENCOUNTER — Inpatient Hospital Stay (HOSPITAL_COMMUNITY)
Admission: EM | Admit: 2015-05-30 | Discharge: 2015-05-31 | DRG: 638 | Disposition: A | Discharge status: Home / Self Care | Payer: Commercial Managed Care - HMO | Attending: Internal Medicine | Admitting: Internal Medicine

## 2015-05-30 ENCOUNTER — Other Ambulatory Visit: Payer: Self-pay | Admitting: Physician Assistant

## 2015-05-30 ENCOUNTER — Emergency Department (HOSPITAL_COMMUNITY): Payer: Commercial Managed Care - HMO

## 2015-05-30 DIAGNOSIS — Z794 Long term (current) use of insulin: Secondary | ICD-10-CM

## 2015-05-30 DIAGNOSIS — I10 Essential (primary) hypertension: Secondary | ICD-10-CM | POA: Diagnosis present

## 2015-05-30 DIAGNOSIS — E1010 Type 1 diabetes mellitus with ketoacidosis without coma: Secondary | ICD-10-CM

## 2015-05-30 DIAGNOSIS — G8929 Other chronic pain: Secondary | ICD-10-CM | POA: Diagnosis present

## 2015-05-30 DIAGNOSIS — F329 Major depressive disorder, single episode, unspecified: Secondary | ICD-10-CM | POA: Diagnosis present

## 2015-05-30 DIAGNOSIS — N179 Acute kidney failure, unspecified: Secondary | ICD-10-CM | POA: Diagnosis present

## 2015-05-30 DIAGNOSIS — Z87891 Personal history of nicotine dependence: Secondary | ICD-10-CM | POA: Diagnosis not present

## 2015-05-30 DIAGNOSIS — F419 Anxiety disorder, unspecified: Secondary | ICD-10-CM | POA: Diagnosis present

## 2015-05-30 DIAGNOSIS — Z955 Presence of coronary angioplasty implant and graft: Secondary | ICD-10-CM

## 2015-05-30 DIAGNOSIS — K219 Gastro-esophageal reflux disease without esophagitis: Secondary | ICD-10-CM | POA: Diagnosis present

## 2015-05-30 DIAGNOSIS — E785 Hyperlipidemia, unspecified: Secondary | ICD-10-CM | POA: Diagnosis present

## 2015-05-30 DIAGNOSIS — E1042 Type 1 diabetes mellitus with diabetic polyneuropathy: Secondary | ICD-10-CM | POA: Diagnosis present

## 2015-05-30 DIAGNOSIS — R739 Hyperglycemia, unspecified: Secondary | ICD-10-CM | POA: Diagnosis present

## 2015-05-30 DIAGNOSIS — M25512 Pain in left shoulder: Secondary | ICD-10-CM | POA: Diagnosis present

## 2015-05-30 DIAGNOSIS — K2270 Barrett's esophagus without dysplasia: Secondary | ICD-10-CM | POA: Diagnosis present

## 2015-05-30 DIAGNOSIS — I2510 Atherosclerotic heart disease of native coronary artery without angina pectoris: Secondary | ICD-10-CM | POA: Diagnosis present

## 2015-05-30 DIAGNOSIS — I252 Old myocardial infarction: Secondary | ICD-10-CM | POA: Diagnosis not present

## 2015-05-30 DIAGNOSIS — R0789 Other chest pain: Secondary | ICD-10-CM | POA: Diagnosis not present

## 2015-05-30 DIAGNOSIS — E875 Hyperkalemia: Secondary | ICD-10-CM | POA: Diagnosis present

## 2015-05-30 DIAGNOSIS — E1310 Other specified diabetes mellitus with ketoacidosis without coma: Secondary | ICD-10-CM | POA: Diagnosis not present

## 2015-05-30 DIAGNOSIS — E1110 Type 2 diabetes mellitus with ketoacidosis without coma: Secondary | ICD-10-CM | POA: Diagnosis present

## 2015-05-30 DIAGNOSIS — F32A Depression, unspecified: Secondary | ICD-10-CM | POA: Diagnosis present

## 2015-05-30 HISTORY — DX: Acute myocardial infarction, unspecified: I21.9

## 2015-05-30 LAB — BASIC METABOLIC PANEL
Anion gap: 20 — ABNORMAL HIGH (ref 5–15)
Anion gap: 10 (ref 5–15)
BUN: 22 mg/dL — ABNORMAL HIGH (ref 6–20)
BUN: 19 mg/dL (ref 6–20)
CALCIUM: 8.7 mg/dL — AB (ref 8.9–10.3)
CALCIUM: 8.3 mg/dL — AB (ref 8.9–10.3)
CHLORIDE: 98 mmol/L — AB (ref 101–111)
CHLORIDE: 110 mmol/L (ref 101–111)
CO2: 14 mmol/L — AB (ref 22–32)
CO2: 17 mmol/L — AB (ref 22–32)
CREATININE: 1.38 mg/dL — AB (ref 0.61–1.24)
CREATININE: 1.11 mg/dL (ref 0.61–1.24)
GFR calc Af Amer: >60 mL/min (ref >60)
GFR calc Af Amer: >60 mL/min (ref >60)
GFR calc non Af Amer: 56 mL/min — ABNORMAL LOW (ref >60)
GFR calc non Af Amer: >60 mL/min (ref >60)
GLUCOSE: 628 mg/dL — AB (ref 65–99)
GLUCOSE: 194 mg/dL — AB (ref 65–99)
POTASSIUM: 4.1 mmol/L (ref 3.5–5.1)
Potassium: 5.2 mmol/L — ABNORMAL HIGH (ref 3.5–5.1)
Sodium: 132 mmol/L — ABNORMAL LOW (ref 135–145)
Sodium: 137 mmol/L (ref 135–145)

## 2015-05-30 LAB — GLUCOSE, CAPILLARY
GLUCOSE-CAPILLARY: 271 mg/dL — AB (ref 65–99)
GLUCOSE-CAPILLARY: 210 mg/dL — AB (ref 65–99)
GLUCOSE-CAPILLARY: 149 mg/dL — AB (ref 65–99)
Glucose-Capillary: 323 mg/dL — ABNORMAL HIGH (ref 65–99)
Glucose-Capillary: 153 mg/dL — ABNORMAL HIGH (ref 65–99)

## 2015-05-30 LAB — CBG MONITORING, ED
Glucose-Capillary: 562 mg/dL (ref 65–99)
Glucose-Capillary: 569 mg/dL (ref 65–99)
Glucose-Capillary: 430 mg/dL — ABNORMAL HIGH (ref 65–99)

## 2015-05-30 LAB — BLOOD GAS, VENOUS
Acid-base deficit: 13.5 mmol/L — ABNORMAL HIGH (ref 0.0–2.0)
Bicarbonate: 12.7 mEq/L — ABNORMAL LOW (ref 20.0–24.0)
FIO2: 0.21 %
O2 SAT: 80.2 %
Patient temperature: 98.6
TCO2: 11.7 mmol/L (ref 0–100)
pCO2, Ven: 31 mmHg — ABNORMAL LOW (ref 45.0–50.0)
pH, Ven: 7.236 — ABNORMAL LOW (ref 7.250–7.300)
pO2, Ven: 51.6 mmHg — ABNORMAL HIGH (ref 30.0–45.0)

## 2015-05-30 LAB — CBC
HCT: 44.6 % (ref 39.0–52.0)
HEMOGLOBIN: 15.3 g/dL (ref 13.0–17.0)
MCH: 30.2 pg (ref 26.0–34.0)
MCHC: 34.3 g/dL (ref 30.0–36.0)
MCV: 88 fL (ref 78.0–100.0)
PLATELETS: 206 10*3/uL (ref 150–400)
RBC: 5.07 MIL/uL (ref 4.22–5.81)
RDW: 12.7 % (ref 11.5–15.5)
WBC: 7.5 10*3/uL (ref 4.0–10.5)

## 2015-05-30 LAB — I-STAT TROPONIN, ED: TROPONIN I, POC: 0 ng/mL (ref 0.00–0.08)

## 2015-05-30 LAB — TROPONIN I: Troponin I: <0.03 ng/mL (ref <0.031)

## 2015-05-30 LAB — BRAIN NATRIURETIC PEPTIDE: B Natriuretic Peptide: 17.3 pg/mL (ref 0.0–100.0)

## 2015-05-30 MED ORDER — SODIUM CHLORIDE 0.9 % IV SOLN
1000.0000 mL | Freq: Once | INTRAVENOUS | Status: AC
  Administered 2015-05-30: 1000 mL via INTRAVENOUS

## 2015-05-30 MED ORDER — SODIUM CHLORIDE 0.9 % IV SOLN
1000.0000 mL | INTRAVENOUS | Status: DC
  Administered 2015-05-30: 1000 mL via INTRAVENOUS

## 2015-05-30 MED ORDER — ISOSORBIDE MONONITRATE ER 30 MG PO TB24: 30.0000 mg | ORAL_TABLET | Freq: Every day | ORAL | Status: DC

## 2015-05-30 MED ORDER — PANTOPRAZOLE SODIUM 40 MG PO TBEC
40.0000 mg | DELAYED_RELEASE_TABLET | Freq: Every day | ORAL | Status: DC
  Administered 2015-05-31: 40 mg via ORAL
  Filled 2015-05-30: qty 1

## 2015-05-30 MED ORDER — ISOSORBIDE MONONITRATE ER 30 MG PO TB24
30.0000 mg | ORAL_TABLET | Freq: Every day | ORAL | Status: DC
  Filled 2015-05-30: qty 1

## 2015-05-30 MED ORDER — SODIUM CHLORIDE 0.9 % IV SOLN
INTRAVENOUS | Status: DC
  Filled 2015-05-30: qty 2.5

## 2015-05-30 MED ORDER — SODIUM CHLORIDE 0.9 % IV SOLN
INTRAVENOUS | Status: DC
  Administered 2015-05-30: 5.1 [IU]/h via INTRAVENOUS
  Filled 2015-05-30: qty 2.5

## 2015-05-30 MED ORDER — INSULIN GLARGINE 100 UNIT/ML SOLOSTAR PEN: 30.0000 [IU] | PEN_INJECTOR | Freq: Once | SUBCUTANEOUS | Status: DC

## 2015-05-30 MED ORDER — ATORVASTATIN CALCIUM 80 MG PO TABS
80.0000 mg | ORAL_TABLET | Freq: Every day | ORAL | Status: DC
  Filled 2015-05-30: qty 1

## 2015-05-30 MED ORDER — ASPIRIN EC 81 MG PO TBEC
81.0000 mg | DELAYED_RELEASE_TABLET | Freq: Every day | ORAL | Status: DC
  Administered 2015-05-30 – 2015-05-31 (×2): 81 mg via ORAL
  Filled 2015-05-30 (×2): qty 1

## 2015-05-30 MED ORDER — METOPROLOL TARTRATE 25 MG PO TABS
12.5000 mg | ORAL_TABLET | Freq: Two times a day (BID) | ORAL | Status: DC
  Administered 2015-05-30 – 2015-05-31 (×2): 12.5 mg via ORAL
  Filled 2015-05-30 (×2): qty 1

## 2015-05-30 MED ORDER — BUPROPION HCL ER (XL) 300 MG PO TB24
300.0000 mg | ORAL_TABLET | Freq: Every day | ORAL | Status: DC
  Administered 2015-05-30: 300 mg via ORAL
  Filled 2015-05-30 (×2): qty 1

## 2015-05-30 MED ORDER — SODIUM CHLORIDE 0.9 % IV SOLN
INTRAVENOUS | Status: DC
  Administered 2015-05-31: 02:00:00 via INTRAVENOUS

## 2015-05-30 MED ORDER — HEPARIN SODIUM (PORCINE) 5000 UNIT/ML IJ SOLN
5000.0000 [IU] | Freq: Three times a day (TID) | INTRAMUSCULAR | Status: DC
  Administered 2015-05-30 – 2015-05-31 (×3): 5000 [IU] via SUBCUTANEOUS
  Filled 2015-05-30 (×4): qty 1

## 2015-05-30 MED ORDER — PRASUGREL HCL 10 MG PO TABS
10.0000 mg | ORAL_TABLET | Freq: Every day | ORAL | Status: DC
  Administered 2015-05-30 – 2015-05-31 (×2): 10 mg via ORAL
  Filled 2015-05-30 (×2): qty 1

## 2015-05-30 MED ORDER — HYDROCODONE-ACETAMINOPHEN 5-325 MG PO TABS
1.0000 | ORAL_TABLET | Freq: Four times a day (QID) | ORAL | Status: DC | PRN
  Administered 2015-05-30 – 2015-05-31 (×4): 1 via ORAL
  Filled 2015-05-30 (×4): qty 1

## 2015-05-30 MED ORDER — EZETIMIBE 10 MG PO TABS
10.0000 mg | ORAL_TABLET | Freq: Every day | ORAL | Status: DC
  Administered 2015-05-30 – 2015-05-31 (×2): 10 mg via ORAL
  Filled 2015-05-30 (×2): qty 1

## 2015-05-30 MED ORDER — DEXTROSE-NACL 5-0.45 % IV SOLN: INTRAVENOUS | Status: DC

## 2015-05-30 MED ORDER — INSULIN GLARGINE 100 UNIT/ML ~~LOC~~ SOLN
35.0000 [IU] | Freq: Two times a day (BID) | SUBCUTANEOUS | Status: DC
  Administered 2015-05-30 – 2015-05-31 (×2): 35 [IU] via SUBCUTANEOUS
  Filled 2015-05-30 (×5): qty 0.35

## 2015-05-30 MED ORDER — DEXTROSE-NACL 5-0.45 % IV SOLN
INTRAVENOUS | Status: DC
  Administered 2015-05-30: 21:00:00 via INTRAVENOUS

## 2015-05-30 NOTE — ED Notes (Signed)
Pt can go to floor at 17:41

## 2015-05-30 NOTE — H&P (Addendum)
[facility] History and Physical  PRESSLEY TADESSE GNF:[id number] DOB: Feb 14, 1956 DOA: 05/30/2015   PCP: Norberto Sorenson, MD    Chief Complaint: high sugars  HPI: [name] [name] is a 59 y.o. male with IDDM, CAD with stents, HTN, GERD, HLP who presents with elevated blood sugars. He states that sugar was high yesterday but he did not take any extra insulin. He took his usual dose of Lantus and NovoLog. This morning his sugar was greater than 500. He has not had any fever chills or sweats. Usually his sugars are in the 100s. Last night he had one episode of vomiting. No abdominal pain or diarrhea. No cough. He does have a runny nose and has been sneezing over the past couple of days. He is found to have DKA and is being admitted. According to his med rec, he takes Lantus 70 units at daily however he states he takes 35 units twice a day and NovoLog 25 units 3 times a day. He was complaining of tightness in his chest when he first arrived to the ER. I was present across his chest and felt a lot like it it felt when he had his heart attack. No complaints of shortness of breath or diaphoresis. The pain was about 6 out of 10. Resolved without any medications.   General: The patient denies anorexia, fever, weight loss of 5 lb in 1 month Cardiac: Denies chest pain, syncope, palpitations, pedal edema  Respiratory: Denies cough, shortness of breath, wheezing GI: + indigestion/heartburn,  + nausea, + vomiting last night,abdominal pain, diarrhea and constipation GU: Denies hematuria, incontinence, dysuria  Musculoskeletal: right shoulder pain, neck pain, right hip pain- steroid injection 1 month ago- pain recurring as it is wearing off Skin: Denies suspicious skin lesions Neurologic: Denies focal weakness or numbness, change in vision Psychiatry: + depression - +  anxiety. Hematologic: no easy bruising or bleeding  All other systems reviewed and found to be negative.  Past Medical History  Diagnosis Date  .  Diabetes type 2, uncontrolled   . Hypercholesterolemia   . GERD (gastroesophageal reflux disease)   . Hypertension   . Hemorrhoids     hx of  . Peripheral neuropathy   . Depression   . CAD (coronary artery disease)     a. NSTEMI 05/2014 - occluded RCA dominant proximal s/p asp-thrombectomy/DES to RCA, minimal LAD/LCx, EF 50% by cath, 65-70% by echo.  . Barrett's esophagus   . Former tobacco use   . MI (myocardial infarction)     Past Surgical History  Procedure Laterality Date  . Tonsillectomy    . Left heart catheterization with coronary angiogram N/A 06/14/2014    Procedure: LEFT HEART CATHETERIZATION WITH CORONARY ANGIOGRAM;  Surgeon: Corky Crafts, MD;  Location: [facility] CATH LAB;  Service: Cardiovascular;  Laterality: N/A;    Social History: does not smoke or drink alcohol  Lives at home with wife- full time student.    No Known Allergies  Family history:   Family History  Problem Relation Age of Onset  . Hypertension Mother   . Alzheimer's disease Mother   . Alcohol abuse Father   . Cancer Father     "Throat"  . Diabetes type II Brother   . Heart disease Neg Hx       Prior to Admission medications   Medication Sig Start Date End Date Taking? Authorizing Provider  ACCU-CHEK SOFTCLIX LANCETS lancets Use as instructed to check blood sugar 3 times per day dx code 250.02  07/10/14  Yes Reather Littler, MD  atorvastatin (LIPITOR) 80 MG tablet Take 1 tablet (80 mg total) by mouth daily. 02/06/15  Yes Corky Crafts, MD  buPROPion (WELLBUTRIN XL) 300 MG 24 hr tablet Take 1 tablet (300 mg total) by mouth daily. 08/10/14  Yes Sherren Mocha, MD  ezetimibe (ZETIA) 10 MG tablet Take 1 tablet (10 mg total) by mouth daily. 05/21/15  Yes Corky Crafts, MD  glucose blood (ACCU-CHEK AVIVA PLUS) test strip Use as instructed to check blood sugar 3 times per day dx code E11.59 11/19/14  Yes Reather Littler, MD  HYDROcodone-acetaminophen (NORCO/VICODIN) 5-325 MG per tablet Take 1 tablet by  mouth every 6 (six) hours as needed for moderate pain.   Yes Historical Provider, MD  insulin aspart (NOVOLOG FLEXPEN) 100 UNIT/ML FlexPen Inject 25 units three times daily 04/19/15  Yes Sherren Mocha, MD  isosorbide mononitrate (IMDUR) 30 MG 24 hr tablet Take 1 tablet (30 mg total) by mouth daily. 07/02/14  Yes Rosalio Macadamia, NP  LANTUS SOLOSTAR 100 UNIT/ML Solostar Pen INJECT 70 UNITS SUBCUTANEOUSLY ONCE DAILY AT  10  PM  (PATIENT  NEEDS  APPOINTMENT) 05/23/15  Yes Sherren Mocha, MD  lisinopril (PRINIVIL,ZESTRIL) 2.5 MG tablet Take 1 tablet (2.5 mg total) by mouth daily. 02/05/15  Yes Corky Crafts, MD  metoprolol tartrate (LOPRESSOR) 25 MG tablet Take 0.5 tablets (12.5 mg total) by mouth 2 (two) times daily. 06/18/14  Yes Dayna N Dunn, PA-C  nitroGLYCERIN (NITROSTAT) 0.4 MG SL tablet Place 1 tablet (0.4 mg total) under the tongue every 5 (five) minutes as needed for chest pain (up to 3 doses). 06/18/14  Yes Dayna N Dunn, PA-C  pantoprazole (PROTONIX) 40 MG tablet Take 1 tablet (40 mg total) by mouth daily. 04/17/15  Yes Chelle Jeffery, PA-C  prasugrel (EFFIENT) 10 MG TABS tablet Take 1 tablet (10 mg total) by mouth daily. 06/18/14  Yes Dayna N Dunn, PA-C  aspirin EC 81 MG EC tablet Take 1 tablet (81 mg total) by mouth daily. 06/18/14   Dayna N Dunn, PA-C  Insulin Pen Needle (B-D UF III MINI PEN NEEDLES) 31G X 5 MM MISC Use to inject insulin 5 pen needles per day 07/10/14   Reather Littler, MD  NOVOLOG FLEXPEN 100 UNIT/ML FlexPen INJECT 25 UNITS SUBCUTANEOUSLY THREE TIMES DAILY Patient not taking: Reported on 05/30/2015 05/23/15   Sherren Mocha, MD  terbinafine (LAMISIL) 250 MG tablet Take 1 tablet (250 mg total) by mouth daily. Patient not taking: Reported on 05/30/2015 08/31/14   Sherren Mocha, MD     Physical Exam: Filed Vitals:   05/30/15 1355 05/30/15 1707  BP: 131/69 115/66  Pulse: 120 111  Temp: 97.7 F (36.5 C)   TempSrc: Oral   Resp: 17 17  SpO2: 97% 100%     General: AAO x 3, no acute  distress HEENT: Normocephalic and Atraumatic, Mucous membranes pink                PERRLA; EOM intact; No scleral icterus,                 Nares: Patent, Oropharynx: Clear, Fair Dentition                 Neck: FROM, no cervical lymphadenopathy, thyromegaly, carotid bruit or JVD;  Breasts: deferred CHEST WALL: No tenderness  CHEST: Normal respiration, clear to auscultation bilaterally  HEART: Regular rate and rhythm; no murmurs rubs or gallops  BACK:  No kyphosis or scoliosis; no CVA tenderness  GI: Positive Bowel Sounds, soft, non-tender; no masses, no organomegaly Rectal Exam: deferred MSK: No cyanosis, clubbing, or edema Genitalia: not examined  SKIN:  no rash or ulceration  CNS: Alert and Oriented x 4, Nonfocal exam, CN 2-12 intact  Labs on Admission:  Basic Metabolic Panel:  Recent Labs Lab 05/30/15 1408  NA 132*  K 5.2*  CL 98*  CO2 14*  GLUCOSE 628*  BUN 22*  CREATININE 1.38*  CALCIUM 8.7*   Liver Function Tests: No results for input(s): AST, ALT, ALKPHOS, BILITOT, PROT, ALBUMIN in the last 168 hours. No results for input(s): LIPASE, AMYLASE in the last 168 hours. No results for input(s): AMMONIA in the last 168 hours. CBC:  Recent Labs Lab 05/30/15 1408  WBC 7.5  HGB 15.3  HCT 44.6  MCV 88.0  PLT 206   Cardiac Enzymes: No results for input(s): CKTOTAL, CKMB, CKMBINDEX, TROPONINI in the last 168 hours.  BNP (last 3 results)  Recent Labs  05/30/15 1510  BNP 17.3    ProBNP (last 3 results) No results for input(s): PROBNP in the last 8760 hours.  CBG:  Recent Labs Lab 05/30/15 1359 05/30/15 1636 05/30/15 1742  GLUCAP 562* 569* 430*    Radiological Exams on Admission: Dg Chest Port 1 View  05/30/2015   CLINICAL DATA:  Shortness of breath and chest tightness  EXAM: PORTABLE CHEST - 1 VIEW  COMPARISON:  June 14, 2014  FINDINGS: Lungs are clear. Heart size and pulmonary vascularity are normal. No adenopathy. No pneumothorax. No bone lesions.   IMPRESSION: No edema or consolidation.   Electronically Signed   By: Bretta Bang III M.D.   On: 05/30/2015 15:43    EKG: Independently reviewed. Sinus tachycardia -no acute changes  Assessment/Plan Principal Problem:   DKA (diabetic ketoacidoses)/ insulin requiring diabetes mellitus - Insulin infusion initiated-we'll resume Lantus at 35 units twice a day as well to help him come off the infusion sooner -Although he states that his sugars run in the 100s most of the time, documentation in epic reveals that his diabetes is quite uncontrolled. Last A1c 6 months ago in December 2015 was 11.3  Active Problems: Chest pressure- coronary artery disease with stents -EKG unrevealing-first troponin negative-continue to check 2 more sets of troponin-resume Imdur and metoprolol --Continue aspirin and Effient  Acute renal failure -Likely prerenal from DKA-hold lisinopril-hydrate aggressively  Hyperkalemia -Hold lisinopril-hydrate and follow    Depression - Continue Wellbutrin    Chronic pain in left shoulder -Continue Vicodin which she takes at home    Gastric topical reflux disease --Continue Protonix  Peripheral neuropathy -He has not gotten refills on his Neurontin     Consulted:   Code Status: Full code  Family Communication:   DVT Prophylaxis: Heparin  Time spent: 50 minutes  Angle Karel, MD [facility]  If 7PM-7AM, please contact night-coverage [email or web address] 05/30/2015, 5:58 PM

## 2015-05-30 NOTE — ED Notes (Signed)
Bed: WA24 Expected date:  Expected time:  Means of arrival:  Comments: Triage 1 

## 2015-05-30 NOTE — ED Notes (Signed)
Not enough blood return for Istat, notified nurse to draw if start IV.  If not I will go back to draw more labs

## 2015-05-30 NOTE — ED Provider Notes (Signed)
CSN: [id number]     Arrival date & time 05/30/15  1340 History   First MD Initiated Contact with Patient 05/30/15 1501     Chief Complaint  Patient presents with  . Back Pain  . Shortness of Breath  . Chest Pain  . Hyperglycemia     (Consider location/radiation/quality/duration/timing/severity/associated sxs/prior Treatment) Patient is a 59 y.o. male presenting with general illness.  Illness Quality:  Malaise, back pain, chest tightness, elevated blood sugars Severity:  Severe Onset quality:  Gradual Duration:  2 days Timing:  Constant Progression:  Worsening Chronicity:  Recurrent Relieved by:  Nothing Worsened by:  Nothing  Ineffective treatments:  Insulin Associated symptoms: chest pain, nausea, shortness of breath and vomiting (x 1)   Associated symptoms: no abdominal pain, no cough and no fever   Associated symptoms comment:  Sneezing   Past Medical History  Diagnosis Date  . Diabetes type 2, uncontrolled   . Hypercholesterolemia   . GERD (gastroesophageal reflux disease)   . Hypertension   . Hemorrhoids     hx of  . Peripheral neuropathy   . Depression   . CAD (coronary artery disease)     a. NSTEMI 05/2014 - occluded RCA dominant proximal s/p asp-thrombectomy/DES to RCA, minimal LAD/LCx, EF 50% by cath, 65-70% by echo.  . Barrett's esophagus   . Former tobacco use   . MI (myocardial infarction)    Past Surgical History  Procedure Laterality Date  . Tonsillectomy    . Left heart catheterization with coronary angiogram N/A 06/14/2014    Procedure: LEFT HEART CATHETERIZATION WITH CORONARY ANGIOGRAM;  Surgeon: Corky Crafts, MD;  Location: [facility] CATH LAB;  Service: Cardiovascular;  Laterality: N/A;   Family History  Problem Relation Age of Onset  . Hypertension Mother   . Alzheimer's disease Mother   . Alcohol abuse Father   . Cancer Father     "Throat"  . Diabetes type II Brother   . Heart disease Neg Hx    History  Substance Use Topics  . Smoking  status: Former Smoker -- 0.25 packs/day for 3 years    Quit date: 11/30/1998  . Smokeless tobacco: Never Used  . Alcohol Use: No    Review of Systems  Constitutional: Negative for fever.  Respiratory: Positive for shortness of breath. Negative for cough.   Cardiovascular: Positive for chest pain.  Gastrointestinal: Positive for nausea and vomiting (x 1). Negative for abdominal pain.  All other systems reviewed and are negative.     Allergies  Review of patient's allergies indicates no known allergies.  Home Medications   Prior to Admission medications   Medication Sig Start Date End Date Taking? Authorizing Provider  ACCU-CHEK SOFTCLIX LANCETS lancets Use as instructed to check blood sugar 3 times per day dx code 250.02 07/10/14  Yes Reather Littler, MD  atorvastatin (LIPITOR) 80 MG tablet Take 1 tablet (80 mg total) by mouth daily. 02/06/15  Yes Corky Crafts, MD  buPROPion (WELLBUTRIN XL) 300 MG 24 hr tablet Take 1 tablet (300 mg total) by mouth daily. 08/10/14  Yes Sherren Mocha, MD  ezetimibe (ZETIA) 10 MG tablet Take 1 tablet (10 mg total) by mouth daily. 05/21/15  Yes Corky Crafts, MD  glucose blood (ACCU-CHEK AVIVA PLUS) test strip Use as instructed to check blood sugar 3 times per day dx code E11.59 11/19/14  Yes Reather Littler, MD  HYDROcodone-acetaminophen (NORCO/VICODIN) 5-325 MG per tablet Take 1 tablet by mouth every 6 (six) hours as  needed for moderate pain.   Yes Historical Provider, MD  insulin aspart (NOVOLOG FLEXPEN) 100 UNIT/ML FlexPen Inject 25 units three times daily 04/19/15  Yes Sherren Mocha, MD  isosorbide mononitrate (IMDUR) 30 MG 24 hr tablet Take 1 tablet (30 mg total) by mouth daily. 07/02/14  Yes Rosalio Macadamia, NP  LANTUS SOLOSTAR 100 UNIT/ML Solostar Pen INJECT 70 UNITS SUBCUTANEOUSLY ONCE DAILY AT  10  PM  (PATIENT  NEEDS  APPOINTMENT) 05/23/15  Yes Sherren Mocha, MD  lisinopril (PRINIVIL,ZESTRIL) 2.5 MG tablet Take 1 tablet (2.5 mg total) by mouth daily. 02/05/15   Yes Corky Crafts, MD  metoprolol tartrate (LOPRESSOR) 25 MG tablet Take 0.5 tablets (12.5 mg total) by mouth 2 (two) times daily. 06/18/14  Yes Dayna N Dunn, PA-C  nitroGLYCERIN (NITROSTAT) 0.4 MG SL tablet Place 1 tablet (0.4 mg total) under the tongue every 5 (five) minutes as needed for chest pain (up to 3 doses). 06/18/14  Yes Dayna N Dunn, PA-C  pantoprazole (PROTONIX) 40 MG tablet Take 1 tablet (40 mg total) by mouth daily. 04/17/15  Yes Chelle Jeffery, PA-C  prasugrel (EFFIENT) 10 MG TABS tablet Take 1 tablet (10 mg total) by mouth daily. 06/18/14  Yes Dayna N Dunn, PA-C  aspirin EC 81 MG EC tablet Take 1 tablet (81 mg total) by mouth daily. 06/18/14   Dayna N Dunn, PA-C  Insulin Pen Needle (B-D UF III MINI PEN NEEDLES) 31G X 5 MM MISC Use to inject insulin 5 pen needles per day 07/10/14   Reather Littler, MD  NOVOLOG FLEXPEN 100 UNIT/ML FlexPen INJECT 25 UNITS SUBCUTANEOUSLY THREE TIMES DAILY Patient not taking: Reported on 05/30/2015 05/23/15   Sherren Mocha, MD  terbinafine (LAMISIL) 250 MG tablet Take 1 tablet (250 mg total) by mouth daily. Patient not taking: Reported on 05/30/2015 08/31/14   Sherren Mocha, MD   BP 131/69 mmHg  Pulse 120  Temp(Src) 97.7 F (36.5 C) (Oral)  Resp 17  SpO2 97% Physical Exam  Constitutional: He is oriented to person, place, and time. He appears well-developed and well-nourished.  Non-toxic appearance. He has a sickly appearance. No distress.  HENT:  Head: Normocephalic and atraumatic.  Mouth/Throat: Oropharynx is clear and moist. Mucous membranes are dry.  Eyes: Conjunctivae are normal. Pupils are equal, round, and reactive to light. No scleral icterus.  Neck: Neck supple.  Cardiovascular: Regular rhythm, normal heart sounds and intact distal pulses.  Tachycardia present.   No murmur heard. Pulmonary/Chest: Effort normal and breath sounds normal. No stridor. No respiratory distress. He has no wheezes. He has no rales.  Abdominal: Soft. He exhibits no  distension. There is no tenderness.  Musculoskeletal: Normal range of motion. He exhibits no edema.  Neurological: He is alert and oriented to person, place, and time.  Skin: Skin is warm and dry. No rash noted.  Psychiatric: He has a normal mood and affect. His behavior is normal.  Nursing note and vitals reviewed.   ED Course  Procedures (including critical care time) Labs Review Labs Reviewed  BASIC METABOLIC PANEL - Abnormal; Notable for the following:    Sodium 132 (*)    Potassium 5.2 (*)    Chloride 98 (*)    CO2 14 (*)    Glucose, Bld 628 (*)    BUN 22 (*)    Creatinine, Ser 1.38 (*)    Calcium 8.7 (*)    GFR calc non Af Amer 56 (*)    Anion gap  20 (*)    All other components within normal limits  CBG MONITORING, ED - Abnormal; Notable for the following:    Glucose-Capillary 562 (*)    All other components within normal limits  CBC  BRAIN NATRIURETIC PEPTIDE  I-STAT TROPOININ, ED    Imaging Review Dg Chest Port 1 View  05/30/2015   CLINICAL DATA:  Shortness of breath and chest tightness  EXAM: PORTABLE CHEST - 1 VIEW  COMPARISON:  June 14, 2014  FINDINGS: Lungs are clear. Heart size and pulmonary vascularity are normal. No adenopathy. No pneumothorax. No bone lesions.  IMPRESSION: No edema or consolidation.   Electronically Signed   By: Bretta Bang III M.D.   On: 05/30/2015 15:43     EKG Interpretation   Date/Time:  Thursday May 30 2015 13:51:30 EDT Ventricular Rate:  116 PR Interval:  120 QRS Duration: 70 QT Interval:  326 QTC Calculation: 453 R Axis:   33 Text Interpretation:  Sinus tachycardia `no acute change from prior except  rate faster Confirmed by Denton Lank  MD, Caryn Bee ([id number]) on 05/30/2015 2:33:19 PM      MDM   Final diagnoses:  Diabetic ketoacidosis without coma associated with type 1 diabetes mellitus    Labs consistent with DKA, will start glucostabilizer.  Also complains of chest pain, feeling similar to when he had MI.  Troponin  negative.  Will require admission.     Blake Divine, MD 05/30/15 1655

## 2015-05-30 NOTE — ED Notes (Addendum)
Pt reports mid-upper back pain starting intermittently last night with emesis x1. Also c/o SOB starting last night and non-radiating intermittent chest tightness. Says this is how it felt last time he had an MI (had stent placement 06/14/14). Is on an anti-coagulant. Also says his blood sugar has been fluctuating since last night and was unable to acquire a reading on his meter this morning. RR even but appears labored.Marland Kitchen Speaking full/clear sentences. No other c/c. Took 30 units of Novolog between 0900-1000 this morning. CBG 562 mg/dl.

## 2015-05-31 DIAGNOSIS — G8929 Other chronic pain: Secondary | ICD-10-CM

## 2015-05-31 DIAGNOSIS — M25512 Pain in left shoulder: Secondary | ICD-10-CM

## 2015-05-31 LAB — GLUCOSE, CAPILLARY
GLUCOSE-CAPILLARY: 92 mg/dL (ref 65–99)
GLUCOSE-CAPILLARY: 154 mg/dL — AB (ref 65–99)
GLUCOSE-CAPILLARY: 173 mg/dL — AB (ref 65–99)
Glucose-Capillary: 101 mg/dL — ABNORMAL HIGH (ref 65–99)
Glucose-Capillary: 110 mg/dL — ABNORMAL HIGH (ref 65–99)
Glucose-Capillary: 240 mg/dL — ABNORMAL HIGH (ref 65–99)

## 2015-05-31 LAB — BASIC METABOLIC PANEL
ANION GAP: 8 (ref 5–15)
ANION GAP: 9 (ref 5–15)
Anion gap: 8 (ref 5–15)
Anion gap: 8 (ref 5–15)
BUN: 16 mg/dL (ref 6–20)
BUN: 14 mg/dL (ref 6–20)
BUN: 14 mg/dL (ref 6–20)
BUN: 14 mg/dL (ref 6–20)
CALCIUM: 8.4 mg/dL — AB (ref 8.9–10.3)
CHLORIDE: 110 mmol/L (ref 101–111)
CHLORIDE: 109 mmol/L (ref 101–111)
CO2: 20 mmol/L — AB (ref 22–32)
CO2: 19 mmol/L — ABNORMAL LOW (ref 22–32)
CO2: 18 mmol/L — ABNORMAL LOW (ref 22–32)
CO2: 19 mmol/L — ABNORMAL LOW (ref 22–32)
CREATININE: 0.95 mg/dL (ref 0.61–1.24)
CREATININE: 1.01 mg/dL (ref 0.61–1.24)
Calcium: 8.1 mg/dL — ABNORMAL LOW (ref 8.9–10.3)
Calcium: 7.9 mg/dL — ABNORMAL LOW (ref 8.9–10.3)
Calcium: 8 mg/dL — ABNORMAL LOW (ref 8.9–10.3)
Chloride: 110 mmol/L (ref 101–111)
Chloride: 109 mmol/L (ref 101–111)
Creatinine, Ser: 0.82 mg/dL (ref 0.61–1.24)
Creatinine, Ser: 0.88 mg/dL (ref 0.61–1.24)
GFR calc Af Amer: >60 mL/min (ref >60)
GFR calc Af Amer: >60 mL/min (ref >60)
GFR calc Af Amer: >60 mL/min (ref >60)
GFR calc non Af Amer: >60 mL/min (ref >60)
GFR calc non Af Amer: >60 mL/min (ref >60)
GFR calc non Af Amer: >60 mL/min (ref >60)
GLUCOSE: 200 mg/dL — AB (ref 65–99)
Glucose, Bld: 106 mg/dL — ABNORMAL HIGH (ref 65–99)
Glucose, Bld: 226 mg/dL — ABNORMAL HIGH (ref 65–99)
Glucose, Bld: 190 mg/dL — ABNORMAL HIGH (ref 65–99)
POTASSIUM: 3.7 mmol/L (ref 3.5–5.1)
POTASSIUM: 4 mmol/L (ref 3.5–5.1)
POTASSIUM: 3.8 mmol/L (ref 3.5–5.1)
POTASSIUM: 4.1 mmol/L (ref 3.5–5.1)
SODIUM: 136 mmol/L (ref 135–145)
SODIUM: 136 mmol/L (ref 135–145)
Sodium: 138 mmol/L (ref 135–145)
Sodium: 137 mmol/L (ref 135–145)

## 2015-05-31 LAB — BLOOD GAS, ARTERIAL
Acid-base deficit: 6.3 mmol/L — ABNORMAL HIGH (ref 0.0–2.0)
Bicarbonate: 18.1 mEq/L — ABNORMAL LOW (ref 20.0–24.0)
DRAWN BY: 257701
FIO2: 0.21 %
O2 SAT: 97.2 %
PH ART: 7.34 — AB (ref 7.350–7.450)
PO2 ART: 93.2 mmHg (ref 80.0–100.0)
Patient temperature: 98.6
TCO2: 16.2 mmol/L (ref 0–100)
pCO2 arterial: 34.6 mmHg — ABNORMAL LOW (ref 35.0–45.0)

## 2015-05-31 LAB — TROPONIN I

## 2015-05-31 MED ORDER — SODIUM CHLORIDE 0.9 % IV SOLN
INTRAVENOUS | Status: DC
  Filled 2015-05-31: qty 2.5

## 2015-05-31 MED ORDER — INSULIN ASPART 100 UNIT/ML ~~LOC~~ SOLN
0.0000 [IU] | Freq: Three times a day (TID) | SUBCUTANEOUS | Status: DC
  Administered 2015-05-31 (×2): 3 [IU] via SUBCUTANEOUS

## 2015-05-31 MED ORDER — DEXTROSE-NACL 5-0.45 % IV SOLN: INTRAVENOUS | Status: DC

## 2015-05-31 MED ORDER — INSULIN GLARGINE 100 UNIT/ML ~~LOC~~ SOLN: 35.0000 [IU] | Freq: Every day | SUBCUTANEOUS | Status: DC

## 2015-05-31 MED ORDER — SODIUM CHLORIDE 0.9 % IV SOLN
INTRAVENOUS | Status: DC
  Administered 2015-05-31: 15:00:00 via INTRAVENOUS

## 2015-05-31 MED ORDER — POTASSIUM CHLORIDE 10 MEQ/100ML IV SOLN
10.0000 meq | INTRAVENOUS | Status: AC
Start: 2015-05-31 — End: 2015-05-31
  Administered 2015-05-31: 10 meq via INTRAVENOUS
  Filled 2015-05-31 (×2): qty 100

## 2015-05-31 MED ORDER — INSULIN ASPART 100 UNIT/ML ~~LOC~~ SOLN: 25.0000 [IU] | Freq: Three times a day (TID) | SUBCUTANEOUS | Status: DC

## 2015-05-31 MED ORDER — INSULIN GLARGINE 100 UNIT/ML SOLOSTAR PEN: PEN_INJECTOR | SUBCUTANEOUS | Status: DC

## 2015-05-31 MED ORDER — SODIUM CHLORIDE 0.9 % IV SOLN
INTRAVENOUS | Status: AC
  Administered 2015-05-31: 15:00:00 via INTRAVENOUS

## 2015-05-31 NOTE — Progress Notes (Signed)
Utilization review completed.  

## 2015-05-31 NOTE — Progress Notes (Signed)
Inpatient Diabetes Program Recommendations  AACE/ADA: New Consensus Statement on Inpatient Glycemic Control (2013)  Target Ranges:  Prepandial:   less than 140 mg/dL      Peak postprandial:   less than 180 mg/dL (1-2 hours)      Critically ill patients:  140 - 180 mg/dL   Reason for Visit: Hyperglycemia  Diabetes history: DM1 Outpatient Diabetes medications: Lantus 35 units bid, Novolog 25 units tidwc (pt states he takes "some of the time." Current orders for Inpatient glycemic control: Lantus 35 units bid, Novolog moderate tidwc  59 year old male admitted with back pain, chest tightness and hyperglycemia. Endo is Dr. Dwyane Dee. Found to be in DKA on admission. GlucoStabilizer started and pt was transitioned to SQ insulin when criteria was met for discontinuation.  Results for OCIE, TINO (MRN [id number]) as of 05/31/2015 11:00  Ref. Range 05/30/2015 23:17 05/31/2015 00:21 05/31/2015 01:18 05/31/2015 02:39 05/31/2015 07:39  Glucose-Capillary Latest Ref Range: 65-99 mg/dL 153 (H) 110 (H) 92 101 (H) 154 (H)  Results for JOHNNATHAN, HAGEMEISTER (MRN [id number]) as of 05/31/2015 11:00  Ref. Range 11/09/2014 09:55  Hemoglobin A1C Latest Ref Range: 4.6-6.5 % 11.3 (H)  Results for JAYSTEN, ESSNER (MRN [id number]) as of 05/31/2015 11:00  Ref. Range 05/31/2015 10:05  Sodium Latest Ref Range: 135-145 mmol/L 136  Potassium Latest Ref Range: 3.5-5.1 mmol/L 3.8  Chloride Latest Ref Range: 101-111 mmol/L 109  CO2 Latest Ref Range: 22-32 mmol/L 18 (L)  BUN Latest Ref Range: 6-20 mg/dL 14  Creatinine Latest Ref Range: 0.61-1.24 mg/dL 1.01  Calcium Latest Ref Range: 8.9-10.3 mg/dL 7.9 (L)  EGFR (Non-African Amer.) Latest Ref Range: >60 mL/min >60  EGFR (African American) Latest Ref Range: >60 mL/min >60  Glucose Latest Ref Range: 65-99 mg/dL 226 (H)  Anion gap Latest Ref Range: 5-15  9   AG closed. FBS looks good. Needs meal coverage insulin with high post-prandial blood sugars.  HgbA1C has improved from > 14%. Checks blood  sugars approx 2x/day. Pt states he's had diabetes education in the past and not interested in going again.  Recommendations: Add Novolog 6 units tidwc for meal coverage insulin. Titrate until CBGs < 180 mg/dL  Will continue to follow. Thank you. Lorenda Peck, RD, LDN, CDE Inpatient Diabetes Coordinator [phone]

## 2015-05-31 NOTE — Discharge Summary (Signed)
Physician Discharge Summary  [name] [name] ZOX:[id number] DOB: Nov 07, 1956 DOA: 05/30/2015  PCP: Norberto Sorenson, MD  Admit date: 05/30/2015 Discharge date: 05/31/2015  Time spent: 50 minutes  Recommendations for Outpatient Follow-up:  1. Bmet in 1 wk for acidosis   Discharge Condition: stable Diet recommendation: low sodium heart healthy and diabetic diet  Discharge Diagnoses:  Principal Problem:   DKA (diabetic ketoacidoses) Active Problems:   Acute renal failure   Depression   Chronic pain in left shoulder   CAD (coronary artery disease)   History of present illness:  [name] [name] is a 59 y.o. male with IDDM, CAD with stents, HTN, GERD, HLP who presents with elevated blood sugars. The patient states that he was taking his Lantus and NovoLog as prescribed and initially said he is not sure why his sugars became elevated. He tells me later this morning that he may have had soda that was not sugar-free which may resulted in the hyperglycemia. He tells me on a normal day his sugars are well controlled although his last A1c 6 months ago was 11 3. He states he had his A1c checked recently at his doctor's office but is not sure what it was.  Hospital Course:  Principal Problem:  DKA (diabetic ketoacidoses)/ insulin requiring diabetes mellitus -  Insulin infusion resulted in resolution of DKA -  Resumed Lantus at 35 units twice a day and NovoLog as he takes at home -  Although he states that his sugars run in the 100s most of the time, documentation in epic reveals that his diabetes is quite uncontrolled. Last A1c 6 months ago in December 2015 was 11.3  Active Problems: Chest pressure- coronary artery disease with stents -EKG unrevealing- 3 negative troponin- no recurrence of pain --Continue aspirin and Effient  Acute renal failure -Likely prerenal from DKA-held lisinopril- now resolved  Metabolic acidosis - mildly low bicarb- see below  - no longer due to DKA as gap has closed- recheck  Bmet in 1 wk- may have developed an RTA from uncontrolled DM  Hyperkalemia -Held lisinopril-hydrated- resolved   Depression - Continue Wellbutrin   Chronic pain in left shoulder -Continue Vicodin which she takes at home   Gastric topical reflux disease --Continue Protonix  Peripheral neuropathy -He has not gotten refills on his Neurontin - needs to go back to PCP   Discharge Exam: Filed Weights   05/30/15 1815 05/30/15 2122  Weight: 94.212 kg (207 lb 11.2 oz) 93.895 kg (207 lb)   Filed Vitals:   05/31/15 1007  BP: 109/69  Pulse: 81  Temp: 98.5 F (36.9 C)  Resp: 20    General: AAO x 3, no distress Cardiovascular: RRR, no murmurs  Respiratory: clear to auscultation bilaterally GI: soft, non-tender, non-distended, bowel sound positive  Discharge Instructions You were cared for by a hospitalist during your hospital stay. If you have any questions about your discharge medications or the care you received while you were in the hospital after you are discharged, you can call the unit and asked to speak with the hospitalist on call if the hospitalist that took care of you is not available. Once you are discharged, your primary care physician will handle any further medical issues. Please note that NO REFILLS for any discharge medications will be authorized once you are discharged, as it is imperative that you return to your primary care physician (or establish a relationship with a primary care physician if you do not have one) for your aftercare needs so that  they can reassess your need for medications and monitor your lab values.     Medication List    TAKE these medications        ACCU-CHEK SOFTCLIX LANCETS lancets  Use as instructed to check blood sugar 3 times per day dx code 250.02     aspirin 81 MG EC tablet  Take 1 tablet (81 mg total) by mouth daily.     atorvastatin 80 MG tablet  Commonly known as:  LIPITOR  Take 1 tablet (80 mg total) by mouth daily.      buPROPion 300 MG 24 hr tablet  Commonly known as:  WELLBUTRIN XL  Take 1 tablet (300 mg total) by mouth daily.     ezetimibe 10 MG tablet  Commonly known as:  ZETIA  Take 1 tablet (10 mg total) by mouth daily.     glucose blood test strip  Commonly known as:  ACCU-CHEK AVIVA PLUS  Use as instructed to check blood sugar 3 times per day dx code E11.59     HYDROcodone-acetaminophen 5-325 MG per tablet  Commonly known as:  NORCO/VICODIN  Take 1 tablet by mouth every 6 (six) hours as needed for moderate pain (pain).     insulin aspart 100 UNIT/ML FlexPen  Commonly known as:  NOVOLOG FLEXPEN  Inject 25 units three times daily     Insulin Glargine 100 UNIT/ML Solostar Pen  Commonly known as:  LANTUS SOLOSTAR  INJECT 35 UNITS SUBCUTANEOUSLY IN THE MORNING AND 35 UNITS IN THE EVENING  (PATIENT  NEEDS  APPOINTMENT)     Insulin Pen Needle 31G X 5 MM Misc  Commonly known as:  B-D UF III MINI PEN NEEDLES  Use to inject insulin 5 pen needles per day     isosorbide mononitrate 30 MG 24 hr tablet  Commonly known as:  IMDUR  Take 1 tablet (30 mg total) by mouth daily.     lisinopril 2.5 MG tablet  Commonly known as:  PRINIVIL,ZESTRIL  Take 1 tablet (2.5 mg total) by mouth daily.     metoprolol tartrate 25 MG tablet  Commonly known as:  LOPRESSOR  Take 0.5 tablets (12.5 mg total) by mouth 2 (two) times daily.     nitroGLYCERIN 0.4 MG SL tablet  Commonly known as:  NITROSTAT  Place 1 tablet (0.4 mg total) under the tongue every 5 (five) minutes as needed for chest pain (up to 3 doses).     pantoprazole 40 MG tablet  Commonly known as:  PROTONIX  Take 1 tablet (40 mg total) by mouth daily.     prasugrel 10 MG Tabs tablet  Commonly known as:  EFFIENT  Take 1 tablet (10 mg total) by mouth daily.       No Known Allergies    The results of significant diagnostics from this hospitalization (including imaging, microbiology, ancillary and laboratory) are listed below for reference.     Significant Diagnostic Studies: Dg Chest Port 1 View  05/30/2015   CLINICAL DATA:  Shortness of breath and chest tightness  EXAM: PORTABLE CHEST - 1 VIEW  COMPARISON:  June 14, 2014  FINDINGS: Lungs are clear. Heart size and pulmonary vascularity are normal. No adenopathy. No pneumothorax. No bone lesions.  IMPRESSION: No edema or consolidation.   Electronically Signed   By: Bretta Bang III M.D.   On: 05/30/2015 15:43    Microbiology: No results found for this or any previous visit (from the past 240 hour(s)).   Labs: Basic Metabolic  Panel:  Recent Labs Lab 05/30/15 2155 05/31/15 0109 05/31/15 0548 05/31/15 1005 05/31/15 1215  NA 137 138 137 136 136  K 4.1 3.7 4.0 3.8 4.1  CL 110 110 110 109 109  CO2 17* 20* 19* 18* 19*  GLUCOSE 194* 106* 200* 226* 190*  BUN CREATININE 1.11 0.95 0.82 1.01 0.88  CALCIUM 8.3* 8.4* 8.1* 7.9* 8.0*   Liver Function Tests: No results for input(s): AST, ALT, ALKPHOS, BILITOT, PROT, ALBUMIN in the last 168 hours. No results for input(s): LIPASE, AMYLASE in the last 168 hours. No results for input(s): AMMONIA in the last 168 hours. CBC:  Recent Labs Lab 05/30/15 1408  WBC 7.5  HGB 15.3  HCT 44.6  MCV 88.0  PLT 206   Cardiac Enzymes:  Recent Labs Lab 05/30/15 1825 05/31/15 0109 05/31/15 0548  TROPONINI <0.03 <0.03 <0.03   BNP: BNP (last 3 results)  Recent Labs  05/30/15 1510  BNP 17.3    ProBNP (last 3 results) No results for input(s): PROBNP in the last 8760 hours.  CBG:  Recent Labs Lab 05/31/15 0021 05/31/15 0118 05/31/15 0239 05/31/15 0739 05/31/15 1151  GLUCAP 110* 92 101* 154* 173*       SignedCalvert Cantor, MD [facility] 05/31/2015, 1:27 PM

## 2015-06-01 LAB — HEMOGLOBIN A1C
HEMOGLOBIN A1C: 9.8 % — AB (ref 4.8–5.6)
MEAN PLASMA GLUCOSE: 235 mg/dL

## 2015-06-11 ENCOUNTER — Other Ambulatory Visit: Payer: Self-pay | Admitting: Orthopedic Surgery

## 2015-06-11 ENCOUNTER — Other Ambulatory Visit: Payer: Self-pay | Admitting: Physical Medicine and Rehabilitation

## 2015-06-11 DIAGNOSIS — M169 Osteoarthritis of hip, unspecified: Principal | ICD-10-CM

## 2015-06-11 DIAGNOSIS — M24159 Other articular cartilage disorders, unspecified hip: Secondary | ICD-10-CM

## 2015-06-13 ENCOUNTER — Other Ambulatory Visit: Payer: Self-pay | Admitting: Physician Assistant

## 2015-06-14 ENCOUNTER — Other Ambulatory Visit: Payer: Self-pay | Admitting: Family Medicine

## 2015-06-16 ENCOUNTER — Ambulatory Visit (INDEPENDENT_AMBULATORY_CARE_PROVIDER_SITE_OTHER): Payer: Commercial Managed Care - HMO | Admitting: Family Medicine

## 2015-06-16 VITALS — BP 112/70 | HR 99 | Temp 98.2°F | Resp 18 | Ht 70.0 in | Wt 213.6 lb

## 2015-06-16 DIAGNOSIS — I739 Peripheral vascular disease, unspecified: Secondary | ICD-10-CM | POA: Diagnosis not present

## 2015-06-16 DIAGNOSIS — E1165 Type 2 diabetes mellitus with hyperglycemia: Secondary | ICD-10-CM | POA: Diagnosis not present

## 2015-06-16 DIAGNOSIS — Z23 Encounter for immunization: Secondary | ICD-10-CM | POA: Diagnosis not present

## 2015-06-16 DIAGNOSIS — E1151 Type 2 diabetes mellitus with diabetic peripheral angiopathy without gangrene: Secondary | ICD-10-CM

## 2015-06-16 DIAGNOSIS — N179 Acute kidney failure, unspecified: Secondary | ICD-10-CM

## 2015-06-16 DIAGNOSIS — E1159 Type 2 diabetes mellitus with other circulatory complications: Secondary | ICD-10-CM

## 2015-06-16 DIAGNOSIS — Z9189 Other specified personal risk factors, not elsewhere classified: Secondary | ICD-10-CM

## 2015-06-16 DIAGNOSIS — G629 Polyneuropathy, unspecified: Secondary | ICD-10-CM

## 2015-06-16 DIAGNOSIS — E785 Hyperlipidemia, unspecified: Secondary | ICD-10-CM

## 2015-06-16 DIAGNOSIS — Z9289 Personal history of other medical treatment: Secondary | ICD-10-CM | POA: Diagnosis not present

## 2015-06-16 DIAGNOSIS — IMO0002 Reserved for concepts with insufficient information to code with codable children: Secondary | ICD-10-CM

## 2015-06-16 DIAGNOSIS — Z2839 Other underimmunization status: Secondary | ICD-10-CM

## 2015-06-16 LAB — BASIC METABOLIC PANEL
BUN: 12 mg/dL (ref 6–23)
CO2: 26 meq/L (ref 19–32)
Calcium: 9.2 mg/dL (ref 8.4–10.5)
Chloride: 97 mEq/L (ref 96–112)
Creat: 0.95 mg/dL (ref 0.50–1.35)
Glucose, Bld: 260 mg/dL — ABNORMAL HIGH (ref 70–99)
POTASSIUM: 4.7 meq/L (ref 3.5–5.3)
Sodium: 136 mEq/L (ref 135–145)

## 2015-06-16 MED ORDER — INSULIN ASPART 100 UNIT/ML FLEXPEN: PEN_INJECTOR | SUBCUTANEOUS | Status: DC

## 2015-06-16 MED ORDER — PANTOPRAZOLE SODIUM 40 MG PO TBEC: DELAYED_RELEASE_TABLET | ORAL | Status: DC

## 2015-06-16 MED ORDER — INSULIN GLARGINE 100 UNIT/ML SOLOSTAR PEN: PEN_INJECTOR | SUBCUTANEOUS | Status: DC

## 2015-06-16 NOTE — Progress Notes (Signed)
Subjective:  This chart was scribed for [name], MD by Thea Alken, ED Scribe. This patient was seen in room 7 and the patient's care was started at 1:54 PM.  Patient ID: [name] [name], male    DOB: [birth year], 59 y.o.   MRN: [id number]  HPI   Chief Complaint  Patient presents with  . Immunizations    TD and MMR. (Pt. has paperwork that states he had a Tdap back in 2013).    HPI Comments: [name] is a 59 y.o. male who presents to the [facility] for immunizations for school. Pt is requesting TD and MMR. He obtained some immunizations in [place].  Diabetes Hospitalized 6/30-7/1 instructed to have basic metabolic panel in 1 week. Followed DKA and acute chronic renal failure. Dr. Dwyane Dee is following pt insulin dependent diabetes which has been uncontrolled at least in part of pt compliance and SI. His reported CBG and A1C do not correlate A1C is under poor control.  He has f/u with me 8/12 and no appointment with cardiologist or nephrologist.   Lab Results  Component Value Date   HGBA1C 9.8* 05/31/2015   Pt's sugar level spiked in the 700's prior to hospitalization. Due to having trouble with his insurance he is unable to obtain refills on time. He currently has his lantus but is out of Novolog. He gives himself 35 units of lantus twice a day. When on both medication his sugars stay in the 100's. He is still seeing Dr. Dwyane Dee.   GERD He is also needing refill of Protonix  Hip pain Pt has an MRI and fluoroscopy of shoulder scheduled for 7/27. MRI is scheduled for a right hip. He has received an injection in right hip in the past with relief for a short amount of time. He reports right hip pain is improving. He has not been seen by pain management yet. He is taking gabapentin 371m one time a day.   Depression He is taking Wellbrutrin for depression. He does not take zetia due to cost.   Past Medical History  Diagnosis Date  . Diabetes type 2, uncontrolled   .  Hypercholesterolemia   . GERD (gastroesophageal reflux disease)   . Hypertension   . Hemorrhoids     hx of  . Peripheral neuropathy   . Depression   . CAD (coronary artery disease)     a. NSTEMI 05/2014 - occluded RCA dominant proximal s/p asp-thrombectomy/DES to RCA, minimal LAD/LCx, EF 50% by cath, 65-70% by echo.  . Barrett's esophagus   . Former tobacco use   . MI (myocardial infarction)    Past Surgical History  Procedure Laterality Date  . Tonsillectomy    . Left heart catheterization with coronary angiogram N/A 06/14/2014    Procedure: LEFT HEART CATHETERIZATION WITH CORONARY ANGIOGRAM;  Surgeon: JJettie Booze MD;  [facility];  Service: Cardiovascular;  Laterality: N/A;   Prior to Admission medications   Medication Sig Start Date End Date Taking? Authorizing Provider  ACCU-CHEK SOFTCLIX LANCETS lancets Use as instructed to check blood sugar 3 times per day dx code 250.02 07/10/14  Yes AElayne Snare MD  aspirin EC 81 MG EC tablet Take 1 tablet (81 mg total) by mouth daily. 06/18/14  Yes Dayna N Dunn, PA-C  atorvastatin (LIPITOR) 80 MG tablet Take 1 tablet (80 mg total) by mouth daily. 02/06/15  Yes JJettie Booze MD  buPROPion (WELLBUTRIN XL) 300 MG 24 hr tablet Take  1 tablet (300 mg total) by mouth daily. 08/10/14  Yes Eva N Shaw, MD  ezetimibe (ZETIA) 10 MG tablet Take 1 tablet (10 mg total) by mouth daily. 05/21/15  Yes Jayadeep S Varanasi, MD  glucose blood (ACCU-CHEK AVIVA PLUS) test strip Use as instructed to check blood sugar 3 times per day dx code E11.59 11/19/14  Yes Ajay Kumar, MD  HYDROcodone-acetaminophen (NORCO/VICODIN) 5-325 MG per tablet Take 1 tablet by mouth every 6 (six) hours as needed for moderate pain (pain).    Yes Historical Provider, MD  insulin aspart (NOVOLOG FLEXPEN) 100 UNIT/ML FlexPen Inject 25 units three times daily 04/19/15  Yes Eva N Shaw, MD  Insulin Glargine (LANTUS SOLOSTAR) 100 UNIT/ML Solostar Pen INJECT 35 UNITS SUBCUTANEOUSLY IN  THE MORNING AND 35 UNITS IN THE EVENING  (PATIENT  NEEDS  APPOINTMENT) 05/31/15  Yes Saima Rizwan, MD  Insulin Pen Needle (B-D UF III MINI PEN NEEDLES) 31G X 5 MM MISC Use to inject insulin 5 pen needles per day 07/10/14  Yes Ajay Kumar, MD  isosorbide mononitrate (IMDUR) 30 MG 24 hr tablet Take 1 tablet (30 mg total) by mouth daily. 07/02/14  Yes Lori C Gerhardt, NP  lisinopril (PRINIVIL,ZESTRIL) 2.5 MG tablet Take 1 tablet (2.5 mg total) by mouth daily. 02/05/15  Yes Jayadeep S Varanasi, MD  metoprolol tartrate (LOPRESSOR) 25 MG tablet Take 0.5 tablets (12.5 mg total) by mouth 2 (two) times daily. 06/18/14  Yes Dayna N Dunn, PA-C  nitroGLYCERIN (NITROSTAT) 0.4 MG SL tablet Place 1 tablet (0.4 mg total) under the tongue every 5 (five) minutes as needed for chest pain (up to 3 doses). 06/18/14  Yes Dayna N Dunn, PA-C  pantoprazole (PROTONIX) 40 MG tablet TAKE ONE TABLET BY MOUTH ONCE DAILY. 06/14/15  Yes Chelle Jeffery, PA-C  prasugrel (EFFIENT) 10 MG TABS tablet Take 1 tablet (10 mg total) by mouth daily. 06/18/14  Yes Dayna N Dunn, PA-C   Review of Systems  Constitutional: Negative for fever, chills, activity change and appetite change.  Respiratory: Negative for shortness of breath.   Cardiovascular: Negative for chest pain.  Endocrine: Negative for polyuria.  Genitourinary: Negative for enuresis.  Musculoskeletal: Positive for myalgias, back pain, arthralgias and gait problem.  Neurological: Positive for numbness.  Psychiatric/Behavioral: Positive for dysphoric mood and decreased concentration. Negative for confusion.    Objective:  Physical Exam  Constitutional: He is oriented to person, place, and time. He appears well-developed and well-nourished. No distress.  HENT:  Head: Normocephalic and atraumatic.  Eyes: Conjunctivae and EOM are normal.  Neck: Neck supple.  Cardiovascular: Normal rate.   Pulmonary/Chest: Effort normal.  Musculoskeletal: Normal range of motion.  Neurological: He is  alert and oriented to person, place, and time.  Skin: Skin is warm and dry.  Psychiatric: He has a normal mood and affect. His behavior is normal.  Nursing note and vitals reviewed.   Filed Vitals:   06/16/15 1342  BP: 112/70  Pulse: 99  Temp: 98.2 F (36.8 C)  TempSrc: Oral  Resp: 18  Height: 5' 10" (1.778 m)  Weight: 213 lb 9.6 oz (96.888 kg)  SpO2: 98%   Assessment & Plan:  Pt's number of credit hours in the fall at [employer] has increased so he know has to have confirmed UTD vaccines. Patient needs 3 doses of TD, 3rd dose can be given after 1/17. Tdap was given 3 yrs prior and 2nd td today.He also needs Antibody titer for measles and mumps to see if he   needs to be revaccinated. Form completed for pt and copy scanned in.  Immunization History  Administered Date(s) Administered  . Influenza Split 10/02/2012  . Influenza,inj,Quad PF,36+ Mos 08/31/2014  . Pneumococcal Polysaccharide-23 10/02/2012  . Td 06/16/2015  . Tdap 01/10/2012     1. DM (diabetes mellitus), type 2, uncontrolled   2. Acute renal failure, unspecified acute renal failure type   3. Peripheral neuropathy   4. Hyperlipidemia   5. Uncontrolled diabetes mellitus type 2 with peripheral artery disease   6. Need for immunization against tetanus alone   7. Incomplete immunization status     Orders Placed This Encounter  Procedures  . Td vaccine greater than or equal to 7yo preservative free IM  . Basic metabolic panel    Order Specific Question:  Has the patient fasted?    Answer:  No  . Measles/Mumps/Rubella Immunity    Meds ordered this encounter  Medications  . pantoprazole (PROTONIX) 40 MG tablet    Sig: TAKE ONE TABLET BY MOUTH ONCE DAILY.    Dispense:  30 tablet    Refill:  11  . Insulin Glargine (LANTUS SOLOSTAR) 100 UNIT/ML Solostar Pen    Sig: INJECT 40 UNITS SUBCUTANEOUSLY IN THE MORNING AND 40 UNITS IN THE EVENING    Dispense:  8 pen    Refill:  5  . insulin aspart (NOVOLOG FLEXPEN) 100  UNIT/ML FlexPen    Sig: Inject 30-35 units three times daily    Dispense:  10 pen    Refill:  5    I personally performed the services described in this documentation, which was scribed in my presence. The recorded information has been reviewed and considered, and addended by me as needed.  Eva Shaw, MD MPH   

## 2015-06-16 NOTE — Patient Instructions (Signed)
Diphtheria Toxoid; Tetanus Toxoid Adsorbed, DT, Td What is this medicine? DIPHTHERIA AND TETANUS TOXOIDS ADSORBED (dif THEER ee uh and TET n us TOK soids ad SAWRB) is a vaccine. It is used to prevent infections of diphtheria and tetanus (lockjaw). This medicine may be used for other purposes; ask your health care provider or pharmacist if you have questions. COMMON BRAND NAME(S): DECAVAC, TENIVAC What should I tell my health care provider before I take this medicine? They need to know if you have any of these conditions: -bleeding disorder -immune system problems -infection with fever -low levels of platelets in the blood -an unusual or allergic reaction to diphtheria or tetanus toxoid, latex, thimerosal, other medicines, foods, dyes, or preservatives -pregnant or trying to get pregnant -breast-feeding How should I use this medicine? This vaccine is for injection into a muscle. It is given by a health care professional. A copy of Vaccine Information Statements will be given before each vaccination. Read this sheet carefully each time. The sheet may change frequently. Talk to your pediatrician regarding the use of this medicine in children. While this drug may be prescribed for selected conditions, precautions do apply. Overdosage: If you think you have taken too much of this medicine contact a poison control center or emergency room at once. NOTE: This medicine is only for you. Do not share this medicine with others. What if I miss a dose? Keep appointments for follow-up (booster) doses as directed. It is important not to miss your dose. Call your doctor or health care professional if you are unable to keep an appointment. What may interact with this medicine? -adalimumab -anakinra -infliximab -live vaccines -medicines that suppress your immune system -medicines to treat cancer -medicines that treat or prevent blood clots like daily aspirin, enoxaparin, heparin, ticlopidine,  warfarin -radiopharmaceuticals like iodine I-125 or I-131 This list may not describe all possible interactions. Give your health care provider a list of all the medicines, herbs, non-prescription drugs, or dietary supplements you use. Also tell them if you smoke, drink alcohol, or use illegal drugs. Some items may interact with your medicine. What should I watch for while using this medicine? Contact your doctor or health care professional and seek emergency medical care if any serious side effects occur. This vaccine, like all vaccines, may not fully protect everyone. What side effects may I notice from receiving this medicine? Side effects that you should report to your doctor or health care professional as soon as possible: -allergic reactions like skin rash, itching or hives, swelling of the face, lips, or tongue -arthritis pain -breathing problems -changes in hearing -extreme changes in behavior -fast, irregular heartbeat -fever over 100 degrees F -pain, tingling, numbness in the hands or feet -seizures -unusually weak or tired Side effects that usually do not require medical attention (report to your doctor or health care professional if they continue or are bothersome): -aches or pains -bruising, pain, swelling at site where injected -headache -loss of appetite -low-grade fever of 100 degrees F or less -nausea, vomiting -sleepy -swollen glands This list may not describe all possible side effects. Call your doctor for medical advice about side effects. You may report side effects to FDA at 1-800-FDA-1088. Where should I keep my medicine? This drug is given in a hospital or clinic and will not be stored at home. NOTE: This sheet is a summary. It may not cover all possible information. If you have questions about this medicine, talk to your doctor, pharmacist, or health care provider.    2015, Elsevier/Gold Standard. (2008-03-15 13:57:36)  

## 2015-06-17 LAB — MEASLES/MUMPS/RUBELLA IMMUNITY
RUBELLA: 10.3 {index} — AB (ref <0.90)
Rubeola IgG: >300 AU/mL — ABNORMAL HIGH (ref <25.00)

## 2015-06-20 ENCOUNTER — Telehealth: Payer: Self-pay | Admitting: *Deleted

## 2015-06-20 NOTE — Telephone Encounter (Signed)
Pt came to the office requesting his lab and wanting his Novolog changed by Dr. [name] because the insurance company kept denying it.  After speaking with Dr. [name] I was advised to call the pharmacy to check on his medication. [facility] on [address] was called and they stated that his medication of Novolog was ready with no charge.  Pt was called and given the message.  Pt understood.

## 2015-06-26 ENCOUNTER — Other Ambulatory Visit: Payer: Commercial Managed Care - HMO

## 2015-06-30 ENCOUNTER — Other Ambulatory Visit: Payer: Self-pay | Admitting: Physician Assistant

## 2015-07-02 ENCOUNTER — Other Ambulatory Visit: Payer: Self-pay

## 2015-07-04 ENCOUNTER — Telehealth: Payer: Self-pay

## 2015-07-04 NOTE — Telephone Encounter (Signed)
PA attempted for Effient, but [employer] says it is not needed. Faxed this back to his pharmacy.

## 2015-07-12 ENCOUNTER — Encounter: Payer: Medicare HMO | Admitting: Family Medicine

## 2015-07-12 NOTE — Progress Notes (Deleted)
Subjective:  This chart was scribed for [name] Sorenson, MD by Andrew Au, ED Scribe. This patient was seen in room 7 and the patient's care was started at 1:54 PM.  Patient ID: [name] [name], male    DOB: 12-Sep-1956, 59 y.o.   MRN: [id number]  HPI   HPI Comments: [name] is a 59 y.o. male who presents to the [facility] to follow up on his chronic medical conditions.  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  Diabetes Hospitalized 6/30-7/1 from DKA and acute on chronic renal failure which had resolved on recheck 1 wk later.  Pt has had very uncontrolled IDDM for numerous years due to medication non-compliance which is often secondary to depression - almost like a passive suicidal gesture as he has been hospitalized so many times after stopping his insulin (without dr advice).  Over the past several years, Dr. Lucianne Muss has been following pt's insulin dependent diabetes as he has been very resistant to meds and difficulty complying with detailed insulin regimen, in part because he often runs out and insurance won't cover additional insulin until the next month so has been switching between using all his lantus then will run out but get novolog and vice versa.  He gives himself 35 units of lantus twice a day. When on both medication his sugars stay in the 100's.  Pt was on Effient from cardiologist Dr. Eldridge Dace but PA failed  [facility] would not approve.  He does not take zetia due to cost.   Lab Results  Component Value Date   HGBA1C 9.8* 05/31/2015    GERD On Protonix  Joint pain Pt had a MRI and fluoroscopy of shoulder on 7/27 by Dr. Madelon Lips.  MRI showed mild tendinosis of the supraspinatus and infraspinatus tendons without a discrete tear.  She also has moderate to severe DDD at C5-6 with arthritis change and bilateral foraminal stenosis but no spinal stenosis   MRI is ordered by Dr. Maurice Small for his right hip but has not been done yet.  He has  received an injection in right hip in the past with relief for a short amount of time. He reports right hip pain is improving. He has not been seen by pain management yet. He is taking gabapentin  one time a day.     Depression He is taking Wellbrutrin for depression.    Immunization History  Administered Date(s) Administered  . Influenza Split 10/02/2012  . Influenza,inj,Quad PF,36+ Mos 08/31/2014  . Pneumococcal Polysaccharide-23 10/02/2012  . Td 06/16/2015  . Tdap 01/10/2012    Review of Systems  Constitutional: Negative for fever, chills, activity change and appetite change.  Respiratory: Negative for shortness of breath.   Cardiovascular: Negative for chest pain.  Endocrine: Negative for polyuria.  Genitourinary: Negative for enuresis.  Musculoskeletal: Positive for myalgias, back pain, arthralgias and gait problem.  Neurological: Positive for numbness.  Psychiatric/Behavioral: Positive for dysphoric mood and decreased concentration. Negative for confusion.    Objective:  Physical Exam  Constitutional: He is oriented to person, place, and time. He appears well-developed and well-nourished. No distress.  HENT:  Head: Normocephalic and atraumatic.  Eyes: Conjunctivae and EOM are normal.  Neck: Neck supple.  Cardiovascular: Normal rate.   Pulmonary/Chest: Effort normal.  Musculoskeletal: Normal range of motion.  Neurological: He is alert and oriented to person, place, and time.  Skin: Skin is warm and dry.  Psychiatric: He has a  normal mood and affect. His behavior is normal.  Nursing note and vitals reviewed.    Assessment & Plan:     1. DM (diabetes mellitus), type 2, uncontrolled - does not have any recent visit w/ Dr. Lucianne Muss nor any scheduled.  a1c was shockingly decent 6 wks ago at 9.8 <-11.3 <-11.0 <- 11.9.  Check microalb today.  Eye exam done 12/2014.  Antiplatelet therapy?  Foot exam was done 03/2015.  2. Acute renal failure, unspecified acute renal  failure type - resolved after hosp  3. Peripheral neuropathy - on gabapentin - ok to increase prn.  nml tsh 1 yr prev  4. Hyperlipidemia - has appt in a mo to have lfts and flp drawn by cardiology.  Pt was on atorvastatin 80 2 mos ago and was encouraged to start zetia but did not as could not afford.  Last lipids had ldl 123 and tot chol 207 over 1 yr prev.  5. Uncontrolled diabetes mellitus type 2 with peripheral artery disease   6. Need for immunization against tetanus alone   7. Incomplete immunization status - will need 3rd tetanus booster after 12/2015   Has he had a screening colonoscopy? Orders Placed This Encounter  Procedures  . Td vaccine greater than or equal to 7yo preservative free IM  . Basic metabolic panel    Order Specific Question:  Has the patient fasted?    Answer:  No  . Measles/Mumps/Rubella Immunity    No orders of the defined types were placed in this encounter.    I personally performed the services described in this documentation, which was scribed in my presence. The recorded information has been reviewed and considered, and addended by me as needed.  [name] Sorenson, MD MPH

## 2015-07-31 ENCOUNTER — Telehealth: Payer: Self-pay | Admitting: Family Medicine

## 2015-07-31 NOTE — Progress Notes (Signed)
This encounter was created in error - please disregard.

## 2015-07-31 NOTE — Telephone Encounter (Signed)
Pt had a f/u OV sched w/ me on 8/12 which he cancelled/no-showed. Below were the notes I had made prior to his appt in order to expedite his visit. HPI Comments: [name] [name] is a 59 y.o. male who needs to follow up on his chronic medical conditions.  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  Diabetes Hospitalized 6/30-7/1 from DKA and acute on chronic renal failure which had resolved on recheck 1 wk later.  Pt has had very uncontrolled IDDM for numerous years due to medication non-compliance which is often secondary to depression - almost like a passive suicidal gesture as he has been hospitalized so many times after stopping his insulin (without dr advice).  Over the past several years, Dr. Lucianne Muss has been following pt's insulin dependent diabetes as he has been very resistant to meds and difficulty complying with detailed insulin regimen, in part because he often runs out and insurance won't cover additional insulin until the next month so has been switching between using all his lantus then will run out but get novolog and vice versa.  He gives himself 35 units of lantus twice a day. When on both medication his sugars stay in the 100's.  Pt was on Effient from cardiologist Dr. Eldridge Dace but PA failed  [facility] would not approve.  He does not take zetia due to cost.   Lab Results  Component Value Date   HGBA1C 9.8* 05/31/2015    GERD On Protonix  Joint pain Pt had a MRI and fluoroscopy of shoulder on 7/27 by Dr. Madelon Lips.  MRI showed mild tendinosis of the supraspinatus and infraspinatus tendons without a discrete tear.  She also has moderate to severe DDD at C5-6 with arthritis change and bilateral foraminal stenosis but no spinal stenosis   MRI is ordered by Dr. Maurice Small for his right hip but has not been done yet.  He has received an injection in right hip in the past with relief for a short amount of time. He reports right hip pain is improving. He has not been  seen by pain management yet. He is taking gabapentin 300mg  one time a day.     Depression He is taking Wellbrutrin for depression.    Assessment & Plan:     1. DM (diabetes mellitus), type 2, uncontrolled - does not have any recent visit w/ Dr. Lucianne Muss nor any scheduled.  a1c was shockingly decent 6 wks ago at 9.8 <-11.3 <-11.0 <- 11.9.  Check microalb today.  Eye exam done 12/2014.  Antiplatelet therapy?  Foot exam was done 03/2015.  2. Acute renal failure, unspecified acute renal failure type - resolved after hosp  3. Peripheral neuropathy - on gabapentin - ok to increase prn.  nml tsh 1 yr prev  4. Hyperlipidemia - has appt in a mo to have lfts and flp drawn by cardiology.  Pt was on atorvastatin 80 2 mos ago and was encouraged to start zetia but did not as could not afford.  Last lipids had ldl 123 and tot chol 207 over 1 yr prev.  5. Uncontrolled diabetes mellitus type 2 with peripheral artery disease   6. Need for immunization against tetanus alone   7. Incomplete immunization status - will need 3rd tetanus booster after 12/2015   Has he had a screening colonoscopy?

## 2015-08-21 ENCOUNTER — Other Ambulatory Visit: Payer: Self-pay | Admitting: Physician Assistant

## 2015-08-21 ENCOUNTER — Other Ambulatory Visit: Payer: Self-pay | Admitting: Nurse Practitioner

## 2015-08-22 ENCOUNTER — Other Ambulatory Visit: Payer: Commercial Managed Care - HMO

## 2015-08-23 ENCOUNTER — Encounter: Payer: Self-pay | Admitting: Family Medicine

## 2015-08-23 DIAGNOSIS — M542 Cervicalgia: Secondary | ICD-10-CM

## 2015-08-23 DIAGNOSIS — M25551 Pain in right hip: Secondary | ICD-10-CM

## 2015-08-23 DIAGNOSIS — G8929 Other chronic pain: Secondary | ICD-10-CM

## 2015-08-23 DIAGNOSIS — M25552 Pain in left hip: Secondary | ICD-10-CM

## 2015-08-29 ENCOUNTER — Other Ambulatory Visit: Payer: Self-pay | Admitting: Family Medicine

## 2015-09-19 ENCOUNTER — Encounter: Payer: Self-pay | Admitting: Family Medicine

## 2015-09-19 ENCOUNTER — Ambulatory Visit (INDEPENDENT_AMBULATORY_CARE_PROVIDER_SITE_OTHER): Payer: Commercial Managed Care - HMO | Admitting: Family Medicine

## 2015-09-19 VITALS — BP 130/78 | HR 83 | Temp 98.7°F | Resp 16 | Ht 69.5 in | Wt 226.4 lb

## 2015-09-19 DIAGNOSIS — E785 Hyperlipidemia, unspecified: Secondary | ICD-10-CM | POA: Diagnosis not present

## 2015-09-19 DIAGNOSIS — Z794 Long term (current) use of insulin: Secondary | ICD-10-CM | POA: Diagnosis not present

## 2015-09-19 DIAGNOSIS — F32A Depression, unspecified: Secondary | ICD-10-CM

## 2015-09-19 DIAGNOSIS — IMO0002 Reserved for concepts with insufficient information to code with codable children: Secondary | ICD-10-CM

## 2015-09-19 DIAGNOSIS — M542 Cervicalgia: Secondary | ICD-10-CM

## 2015-09-19 DIAGNOSIS — M25512 Pain in left shoulder: Secondary | ICD-10-CM

## 2015-09-19 DIAGNOSIS — I25110 Atherosclerotic heart disease of native coronary artery with unstable angina pectoris: Secondary | ICD-10-CM

## 2015-09-19 DIAGNOSIS — E1165 Type 2 diabetes mellitus with hyperglycemia: Secondary | ICD-10-CM

## 2015-09-19 DIAGNOSIS — Z23 Encounter for immunization: Secondary | ICD-10-CM

## 2015-09-19 DIAGNOSIS — F329 Major depressive disorder, single episode, unspecified: Secondary | ICD-10-CM | POA: Diagnosis not present

## 2015-09-19 DIAGNOSIS — E1151 Type 2 diabetes mellitus with diabetic peripheral angiopathy without gangrene: Secondary | ICD-10-CM | POA: Diagnosis not present

## 2015-09-19 DIAGNOSIS — M25551 Pain in right hip: Secondary | ICD-10-CM | POA: Diagnosis not present

## 2015-09-19 DIAGNOSIS — G8929 Other chronic pain: Secondary | ICD-10-CM | POA: Diagnosis not present

## 2015-09-19 DIAGNOSIS — G63 Polyneuropathy in diseases classified elsewhere: Secondary | ICD-10-CM | POA: Diagnosis not present

## 2015-09-19 LAB — POCT URINALYSIS DIP (MANUAL ENTRY)
Bilirubin, UA: NEGATIVE
Blood, UA: NEGATIVE
Ketones, POC UA: NEGATIVE
LEUKOCYTES UA: NEGATIVE
Nitrite, UA: NEGATIVE
Protein Ur, POC: NEGATIVE
Spec Grav, UA: 1.025
UROBILINOGEN UA: 0.2
pH, UA: 5

## 2015-09-19 LAB — POCT GLYCOSYLATED HEMOGLOBIN (HGB A1C): Hemoglobin A1C: 8.4

## 2015-09-19 LAB — COMPREHENSIVE METABOLIC PANEL
ALBUMIN: 4.3 g/dL (ref 3.6–5.1)
ALT: 9 U/L (ref 9–46)
AST: 19 U/L (ref 10–35)
Alkaline Phosphatase: 91 U/L (ref 40–115)
BUN: 14 mg/dL (ref 7–25)
CHLORIDE: 106 mmol/L (ref 98–110)
CO2: 22 mmol/L (ref 20–31)
CREATININE: 0.77 mg/dL (ref 0.70–1.33)
Calcium: 8.8 mg/dL (ref 8.6–10.3)
Glucose, Bld: 154 mg/dL — ABNORMAL HIGH (ref 65–99)
Potassium: 4.2 mmol/L (ref 3.5–5.3)
SODIUM: 139 mmol/L (ref 135–146)
Total Bilirubin: 0.4 mg/dL (ref 0.2–1.2)
Total Protein: 6.8 g/dL (ref 6.1–8.1)

## 2015-09-19 LAB — LIPID PANEL
CHOL/HDL RATIO: 4.9 ratio (ref <=5.0)
Cholesterol: 249 mg/dL — ABNORMAL HIGH (ref 125–200)
HDL: 51 mg/dL (ref >=40)
LDL CALC: 174 mg/dL — AB (ref <130)
Triglycerides: 120 mg/dL (ref <150)
VLDL: 24 mg/dL (ref <30)

## 2015-09-19 LAB — GLUCOSE, POCT (MANUAL RESULT ENTRY): POC Glucose: 151 mg/dl — AB (ref 70–99)

## 2015-09-19 LAB — HEMOGLOBIN A1C: HEMOGLOBIN A1C: 8.4 % — AB (ref 4.0–6.0)

## 2015-09-19 NOTE — Patient Instructions (Addendum)
We will try [facility] - let me know if you haven't heard from them yet.  You need an appointment with Dr. Mallie Darting and with Dr. Lucianne Muss - I will be happy to refill any of your medications but I am worried about your cholesterol and your worsening back pain.  I will send a note to them to have them call to schedule you.  No psychological or psychiatric services take physician referrals - they always want the patient to call. Some excellent private psychiatrists for individual management are:  [facility] [address] [place], [place] [address] Phone: [phone]  [facility]  [address], [place], [place] [address]  [phone]  [facility] [address], [place], [place] [address]  [phone]  [facility] [address] [place], [place] Phone: [phone]   [facility]  [address] [place], [place] [address]  [phone]   Charlies Silvers, MD, PA [address] [place], [place] [address] Phone: [phone]
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
## 2015-09-20 ENCOUNTER — Encounter: Payer: Self-pay | Admitting: Family Medicine

## 2015-09-20 LAB — MICROALBUMIN / CREATININE URINE RATIO
Creatinine, Urine: 131 mg/dL (ref 20–370)
MICROALB UR: 0.7 mg/dL
Microalb Creat Ratio: 5 mcg/mg creat (ref <30)

## 2015-09-21 MED ORDER — ROSUVASTATIN CALCIUM 40 MG PO TABS: 40.0000 mg | ORAL_TABLET | Freq: Every day | ORAL | Status: DC

## 2015-10-17 ENCOUNTER — Telehealth: Payer: Self-pay | Admitting: Endocrinology

## 2015-10-17 NOTE — Telephone Encounter (Signed)
LM for pt to call back to schedule °

## 2015-10-17 NOTE — Telephone Encounter (Signed)
-----   Message from Reather Littler, MD sent at 10/14/2015  8:07 AM EST ----- Regarding: Appointment needed Her PCP has requested follow-up, please call, needs to be seen as soon as possible  ----- Message -----    From: Sherren Mocha, MD    Sent: 10/13/2015  12:13 PM      To: Reather Littler, MD  Can you please have your office call [place] to arrange f/u - he is uncontrolled and symptomatic - he reported he had f/u scheduled but no appt seen in epic. Thanks, Carley Hammed

## 2015-12-02 ENCOUNTER — Ambulatory Visit (INDEPENDENT_AMBULATORY_CARE_PROVIDER_SITE_OTHER): Payer: Commercial Managed Care - HMO

## 2015-12-02 DIAGNOSIS — Z23 Encounter for immunization: Secondary | ICD-10-CM | POA: Diagnosis not present

## 2015-12-18 ENCOUNTER — Other Ambulatory Visit: Payer: Self-pay | Admitting: Family Medicine

## 2015-12-20 ENCOUNTER — Other Ambulatory Visit: Payer: Self-pay | Admitting: Endocrinology

## 2015-12-20 NOTE — Telephone Encounter (Signed)
Dr [name], I gave pt one refill but it looks like you inst'd him to get in to see Dr Lucianne Muss, and their office has [facility] for pt to CB to sch appt.

## 2015-12-20 NOTE — Telephone Encounter (Signed)
Called pt and spoke to him. Reminded him that he NEEDS to call Dr Remus Blake office and get back in to be seen to get glucose under control. Pt agreed. Dr [name], [place] Childes

## 2015-12-20 NOTE — Telephone Encounter (Signed)
Patient is calling to follow up on refill request °

## 2015-12-27 ENCOUNTER — Other Ambulatory Visit: Payer: Commercial Managed Care - HMO

## 2016-01-21 ENCOUNTER — Other Ambulatory Visit: Payer: Self-pay | Admitting: Family Medicine

## 2016-01-21 ENCOUNTER — Other Ambulatory Visit: Payer: Self-pay | Admitting: Physical Medicine and Rehabilitation

## 2016-01-21 ENCOUNTER — Telehealth: Payer: Self-pay

## 2016-01-21 ENCOUNTER — Telehealth: Payer: Self-pay | Admitting: Interventional Cardiology

## 2016-01-21 DIAGNOSIS — M24159 Other articular cartilage disorders, unspecified hip: Secondary | ICD-10-CM

## 2016-01-21 DIAGNOSIS — M169 Osteoarthritis of hip, unspecified: Principal | ICD-10-CM

## 2016-01-21 NOTE — Telephone Encounter (Signed)
[facility] is calling stating that pt is trying to schedule a mri angio of the hip and was told by one provider from a different office to stop effient and dr [name] says it is ok to take it -[facility] needs clearance that they can schedule this   Please call roberta at [phone]

## 2016-01-21 NOTE — Telephone Encounter (Signed)
OK to hold Effient 5 days prior to the hip procedure.

## 2016-01-21 NOTE — Telephone Encounter (Signed)
Called [facility] and left a message that they should contact [facility] about this medicine.  I do not see where Dr. [name] gave him permission to take this medicine

## 2016-01-21 NOTE — Telephone Encounter (Signed)
Request for surgical clearance:  What type of surgery is being performed? orthogram of hip  1. When is this surgery scheduled? Not yet have to have appt first   Are there any medications that need to be held prior to surgery and how long? Effient Name of physician performing surgery? Dr. Camelia Eng 2. What is your office phone and fax number? [phone]      fax [phone]

## 2016-01-21 NOTE — Telephone Encounter (Signed)
Will route this message to Dr Eldridge Dace and covering nurse for further review, recommendation, and follow-up with Dr Lars Pinks office with cardiac and medication clearance for the pt to receive orthogram of his hip.

## 2016-01-22 ENCOUNTER — Telehealth: Payer: Self-pay | Admitting: Interventional Cardiology

## 2016-01-22 NOTE — Telephone Encounter (Signed)
This message has been manually fax to Dr Camelia Eng at [facility] at [phone] X 2-I did receive conformation that both faxes went successfully.

## 2016-01-22 NOTE — Telephone Encounter (Signed)
Follow Up   Surgical clearance will need 7 days and not 5. Please call back to discuss

## 2016-01-24 DIAGNOSIS — Z0271 Encounter for disability determination: Secondary | ICD-10-CM

## 2016-01-29 ENCOUNTER — Telehealth: Payer: Self-pay | Admitting: Interventional Cardiology

## 2016-01-29 NOTE — Telephone Encounter (Signed)
Follow Up   Surgical clearance will need 7 days and not 5. Please call back to discuss  or FAX [phone]

## 2016-01-29 NOTE — Telephone Encounter (Signed)
[name] [name] from [employer] imagining calling stating that the techs state they need to hold the Effient for 7 days not 5 prior to Arthrogram and injection. Advised would send another message to Dr. Eldridge Dace clarifying that it is an MRI with injection after into hip. Fax approval to [phone] or call if Dr. Eldridge Dace denies holding for 7 days to [place] at [phone]. Will forward to Dr. Eldridge Dace for recommendations.

## 2016-01-29 NOTE — Telephone Encounter (Signed)
OK to hold Effient 7 days prior to arthrogram.

## 2016-01-30 NOTE — Telephone Encounter (Signed)
This message has been manually faxed to [place] at [facility] @ [phone]. I did receive a conformation that the fax went successfully.

## 2016-02-02 ENCOUNTER — Other Ambulatory Visit: Payer: Self-pay | Admitting: Family Medicine

## 2016-02-03 ENCOUNTER — Other Ambulatory Visit: Payer: Self-pay | Admitting: Interventional Cardiology

## 2016-03-06 ENCOUNTER — Other Ambulatory Visit: Payer: Self-pay | Admitting: Family Medicine

## 2016-03-09 ENCOUNTER — Other Ambulatory Visit: Payer: Self-pay | Admitting: Interventional Cardiology

## 2016-04-06 ENCOUNTER — Other Ambulatory Visit: Payer: Self-pay | Admitting: Family Medicine

## 2016-04-12 ENCOUNTER — Emergency Department (HOSPITAL_COMMUNITY): Payer: Commercial Managed Care - HMO

## 2016-04-12 ENCOUNTER — Inpatient Hospital Stay (HOSPITAL_COMMUNITY)
Admission: EM | Admit: 2016-04-12 | Discharge: 2016-04-14 | DRG: 639 | Disposition: A | Discharge status: Home / Self Care | Payer: Commercial Managed Care - HMO | Attending: Internal Medicine | Admitting: Internal Medicine

## 2016-04-12 ENCOUNTER — Encounter (HOSPITAL_COMMUNITY): Payer: Self-pay

## 2016-04-12 DIAGNOSIS — Z8249 Family history of ischemic heart disease and other diseases of the circulatory system: Secondary | ICD-10-CM | POA: Diagnosis not present

## 2016-04-12 DIAGNOSIS — Z794 Long term (current) use of insulin: Secondary | ICD-10-CM

## 2016-04-12 DIAGNOSIS — E785 Hyperlipidemia, unspecified: Secondary | ICD-10-CM | POA: Diagnosis present

## 2016-04-12 DIAGNOSIS — IMO0002 Reserved for concepts with insufficient information to code with codable children: Secondary | ICD-10-CM | POA: Diagnosis present

## 2016-04-12 DIAGNOSIS — Z79899 Other long term (current) drug therapy: Secondary | ICD-10-CM | POA: Diagnosis not present

## 2016-04-12 DIAGNOSIS — Z833 Family history of diabetes mellitus: Secondary | ICD-10-CM | POA: Diagnosis not present

## 2016-04-12 DIAGNOSIS — I252 Old myocardial infarction: Secondary | ICD-10-CM

## 2016-04-12 DIAGNOSIS — K219 Gastro-esophageal reflux disease without esophagitis: Secondary | ICD-10-CM | POA: Diagnosis present

## 2016-04-12 DIAGNOSIS — I2510 Atherosclerotic heart disease of native coronary artery without angina pectoris: Secondary | ICD-10-CM | POA: Diagnosis present

## 2016-04-12 DIAGNOSIS — I82409 Acute embolism and thrombosis of unspecified deep veins of unspecified lower extremity: Secondary | ICD-10-CM

## 2016-04-12 DIAGNOSIS — E7800 Pure hypercholesterolemia, unspecified: Secondary | ICD-10-CM | POA: Diagnosis present

## 2016-04-12 DIAGNOSIS — E1110 Type 2 diabetes mellitus with ketoacidosis without coma: Secondary | ICD-10-CM | POA: Diagnosis present

## 2016-04-12 DIAGNOSIS — Z87891 Personal history of nicotine dependence: Secondary | ICD-10-CM

## 2016-04-12 DIAGNOSIS — G588 Other specified mononeuropathies: Secondary | ICD-10-CM | POA: Diagnosis not present

## 2016-04-12 DIAGNOSIS — E1310 Other specified diabetes mellitus with ketoacidosis without coma: Principal | ICD-10-CM | POA: Diagnosis present

## 2016-04-12 DIAGNOSIS — E1165 Type 2 diabetes mellitus with hyperglycemia: Secondary | ICD-10-CM | POA: Diagnosis present

## 2016-04-12 DIAGNOSIS — Z7982 Long term (current) use of aspirin: Secondary | ICD-10-CM | POA: Diagnosis not present

## 2016-04-12 DIAGNOSIS — G629 Polyneuropathy, unspecified: Secondary | ICD-10-CM | POA: Diagnosis present

## 2016-04-12 DIAGNOSIS — I10 Essential (primary) hypertension: Secondary | ICD-10-CM | POA: Diagnosis present

## 2016-04-12 LAB — BASIC METABOLIC PANEL
Anion gap: 18 — ABNORMAL HIGH (ref 5–15)
Anion gap: 15 (ref 5–15)
Anion gap: 7 (ref 5–15)
Anion gap: 5 (ref 5–15)
BUN: 22 mg/dL — AB (ref 6–20)
BUN: 22 mg/dL — ABNORMAL HIGH (ref 6–20)
BUN: 19 mg/dL (ref 6–20)
BUN: 18 mg/dL (ref 6–20)
CALCIUM: 9 mg/dL (ref 8.9–10.3)
CALCIUM: 8.8 mg/dL — AB (ref 8.9–10.3)
CALCIUM: 8.8 mg/dL — AB (ref 8.9–10.3)
CHLORIDE: 100 mmol/L — AB (ref 101–111)
CO2: 15 mmol/L — AB (ref 22–32)
CO2: 14 mmol/L — AB (ref 22–32)
CO2: 20 mmol/L — AB (ref 22–32)
CO2: 22 mmol/L (ref 22–32)
CREATININE: 1.26 mg/dL — AB (ref 0.61–1.24)
CREATININE: 0.97 mg/dL (ref 0.61–1.24)
CREATININE: 0.91 mg/dL (ref 0.61–1.24)
Calcium: 9.1 mg/dL (ref 8.9–10.3)
Chloride: 108 mmol/L (ref 101–111)
Chloride: 109 mmol/L (ref 101–111)
Chloride: 111 mmol/L (ref 101–111)
Creatinine, Ser: 1.21 mg/dL (ref 0.61–1.24)
GFR calc Af Amer: >60 mL/min (ref >60)
GFR calc Af Amer: >60 mL/min (ref >60)
GFR calc non Af Amer: >60 mL/min (ref >60)
GFR calc non Af Amer: >60 mL/min (ref >60)
GFR calc non Af Amer: >60 mL/min (ref >60)
GFR calc non Af Amer: >60 mL/min (ref >60)
GLUCOSE: 562 mg/dL — AB (ref 65–99)
GLUCOSE: 457 mg/dL — AB (ref 65–99)
GLUCOSE: 208 mg/dL — AB (ref 65–99)
GLUCOSE: 101 mg/dL — AB (ref 65–99)
POTASSIUM: 4.7 mmol/L (ref 3.5–5.1)
Potassium: 4.5 mmol/L (ref 3.5–5.1)
Potassium: 4.3 mmol/L (ref 3.5–5.1)
Potassium: 4 mmol/L (ref 3.5–5.1)
SODIUM: 133 mmol/L — AB (ref 135–145)
Sodium: 137 mmol/L (ref 135–145)
Sodium: 136 mmol/L (ref 135–145)
Sodium: 138 mmol/L (ref 135–145)

## 2016-04-12 LAB — CBC
HCT: 44.6 % (ref 39.0–52.0)
HEMATOCRIT: 43.3 % (ref 39.0–52.0)
Hemoglobin: 14.5 g/dL (ref 13.0–17.0)
Hemoglobin: 14.8 g/dL (ref 13.0–17.0)
MCH: 27.9 pg (ref 26.0–34.0)
MCH: 27.6 pg (ref 26.0–34.0)
MCHC: 33.5 g/dL (ref 30.0–36.0)
MCHC: 33.2 g/dL (ref 30.0–36.0)
MCV: 83.3 fL (ref 78.0–100.0)
MCV: 83.2 fL (ref 78.0–100.0)
PLATELETS: 281 10*3/uL (ref 150–400)
Platelets: 268 10*3/uL (ref 150–400)
RBC: 5.2 MIL/uL (ref 4.22–5.81)
RBC: 5.36 MIL/uL (ref 4.22–5.81)
RDW: 13.8 % (ref 11.5–15.5)
RDW: 13.8 % (ref 11.5–15.5)
WBC: 6.8 10*3/uL (ref 4.0–10.5)
WBC: 8.8 10*3/uL (ref 4.0–10.5)

## 2016-04-12 LAB — GLUCOSE, CAPILLARY
GLUCOSE-CAPILLARY: 213 mg/dL — AB (ref 65–99)
GLUCOSE-CAPILLARY: 165 mg/dL — AB (ref 65–99)
Glucose-Capillary: 113 mg/dL — ABNORMAL HIGH (ref 65–99)
Glucose-Capillary: 135 mg/dL — ABNORMAL HIGH (ref 65–99)
Glucose-Capillary: 198 mg/dL — ABNORMAL HIGH (ref 65–99)
Glucose-Capillary: 268 mg/dL — ABNORMAL HIGH (ref 65–99)
Glucose-Capillary: 369 mg/dL — ABNORMAL HIGH (ref 65–99)
Glucose-Capillary: 521 mg/dL — ABNORMAL HIGH (ref 65–99)
Glucose-Capillary: 91 mg/dL (ref 65–99)
Glucose-Capillary: 93 mg/dL (ref 65–99)

## 2016-04-12 LAB — URINALYSIS, ROUTINE W REFLEX MICROSCOPIC
Bilirubin Urine: NEGATIVE
HGB URINE DIPSTICK: NEGATIVE
LEUKOCYTES UA: NEGATIVE
Nitrite: NEGATIVE
PH: 5 (ref 5.0–8.0)
Protein, ur: NEGATIVE mg/dL
Specific Gravity, Urine: 1.031 — ABNORMAL HIGH (ref 1.005–1.030)

## 2016-04-12 LAB — CBG MONITORING, ED
Glucose-Capillary: 467 mg/dL — ABNORMAL HIGH (ref 65–99)
Glucose-Capillary: 477 mg/dL — ABNORMAL HIGH (ref 65–99)

## 2016-04-12 LAB — CREATININE, SERUM
CREATININE: 1.3 mg/dL — AB (ref 0.61–1.24)
GFR calc Af Amer: >60 mL/min (ref >60)
GFR calc non Af Amer: 59 mL/min — ABNORMAL LOW (ref >60)

## 2016-04-12 LAB — URINE MICROSCOPIC-ADD ON: BACTERIA UA: NONE SEEN

## 2016-04-12 LAB — I-STAT TROPONIN, ED: Troponin i, poc: 0.01 ng/mL (ref 0.00–0.08)

## 2016-04-12 LAB — MRSA PCR SCREENING: MRSA by PCR: NEGATIVE

## 2016-04-12 MED ORDER — SODIUM CHLORIDE 0.9 % IV SOLN
INTRAVENOUS | Status: DC
  Administered 2016-04-12: 15:00:00 via INTRAVENOUS

## 2016-04-12 MED ORDER — SODIUM CHLORIDE 0.9 % IV BOLUS (SEPSIS)
1000.0000 mL | Freq: Once | INTRAVENOUS | Status: AC
  Administered 2016-04-12: 1000 mL via INTRAVENOUS

## 2016-04-12 MED ORDER — ASPIRIN 81 MG PO CHEW
81.0000 mg | CHEWABLE_TABLET | Freq: Every day | ORAL | Status: DC
  Administered 2016-04-13 – 2016-04-14 (×2): 81 mg via ORAL
  Filled 2016-04-12 (×2): qty 1

## 2016-04-12 MED ORDER — POTASSIUM CHLORIDE 10 MEQ/100ML IV SOLN
10.0000 meq | INTRAVENOUS | Status: AC
  Administered 2016-04-12 (×2): 10 meq via INTRAVENOUS
  Filled 2016-04-12 (×2): qty 100

## 2016-04-12 MED ORDER — PANTOPRAZOLE SODIUM 40 MG PO TBEC
40.0000 mg | DELAYED_RELEASE_TABLET | Freq: Every day | ORAL | Status: DC
  Administered 2016-04-13 – 2016-04-14 (×2): 40 mg via ORAL
  Filled 2016-04-12 (×2): qty 1

## 2016-04-12 MED ORDER — DEXTROSE-NACL 5-0.45 % IV SOLN
INTRAVENOUS | Status: DC
  Administered 2016-04-12 – 2016-04-13 (×3): via INTRAVENOUS

## 2016-04-12 MED ORDER — METOPROLOL TARTRATE 12.5 MG HALF TABLET
12.5000 mg | ORAL_TABLET | Freq: Two times a day (BID) | ORAL | Status: DC
  Administered 2016-04-13 – 2016-04-14 (×3): 12.5 mg via ORAL
  Filled 2016-04-12 (×4): qty 1

## 2016-04-12 MED ORDER — BUPROPION HCL ER (XL) 300 MG PO TB24
300.0000 mg | ORAL_TABLET | Freq: Every day | ORAL | Status: DC
  Administered 2016-04-13 – 2016-04-14 (×2): 300 mg via ORAL
  Filled 2016-04-12 (×2): qty 1

## 2016-04-12 MED ORDER — SODIUM CHLORIDE 0.9 % IV SOLN
INTRAVENOUS | Status: DC
  Administered 2016-04-12: 4.2 [IU]/h via INTRAVENOUS
  Filled 2016-04-12: qty 2.5

## 2016-04-12 MED ORDER — PRASUGREL HCL 10 MG PO TABS
10.0000 mg | ORAL_TABLET | Freq: Every day | ORAL | Status: DC
Start: 2016-04-12 — End: 2016-04-14
  Administered 2016-04-12 – 2016-04-14 (×3): 10 mg via ORAL
  Filled 2016-04-12 (×4): qty 1

## 2016-04-12 MED ORDER — HYDROCODONE-ACETAMINOPHEN 5-325 MG PO TABS
1.0000 | ORAL_TABLET | Freq: Four times a day (QID) | ORAL | Status: DC | PRN
  Administered 2016-04-12 – 2016-04-14 (×7): 1 via ORAL
  Filled 2016-04-12 (×7): qty 1

## 2016-04-12 MED ORDER — SODIUM CHLORIDE 0.9 % IV SOLN
INTRAVENOUS | Status: DC
Start: 2016-04-12 — End: 2016-04-13
  Administered 2016-04-12: 15:00:00 via INTRAVENOUS

## 2016-04-12 MED ORDER — LISINOPRIL 2.5 MG PO TABS
2.5000 mg | ORAL_TABLET | Freq: Every day | ORAL | Status: DC
  Administered 2016-04-14: 2.5 mg via ORAL
  Filled 2016-04-12 (×2): qty 1

## 2016-04-12 MED ORDER — ENOXAPARIN SODIUM 40 MG/0.4ML ~~LOC~~ SOLN
40.0000 mg | SUBCUTANEOUS | Status: DC
  Administered 2016-04-13: 40 mg via SUBCUTANEOUS
  Filled 2016-04-12: qty 0.4

## 2016-04-12 MED ORDER — ISOSORBIDE MONONITRATE ER 60 MG PO TB24
30.0000 mg | ORAL_TABLET | Freq: Every day | ORAL | Status: DC
  Administered 2016-04-13: 08:00:00 via ORAL
  Administered 2016-04-14: 30 mg via ORAL
  Filled 2016-04-12 (×2): qty 1

## 2016-04-12 MED ORDER — DEXTROSE-NACL 5-0.45 % IV SOLN: INTRAVENOUS | Status: DC

## 2016-04-12 MED ORDER — SODIUM CHLORIDE 0.9 % IV SOLN: INTRAVENOUS | Status: AC

## 2016-04-12 NOTE — ED Provider Notes (Signed)
CSN: [id number]     Arrival date & time 04/12/16  1051 History   First MD Initiated Contact with Patient 04/12/16 1132     Chief Complaint  Patient presents with  . Emesis  . Hyperglycemia     (Consider location/radiation/quality/duration/timing/severity/associated sxs/prior Treatment) HPI Comments: Patient is a 60 year old male with past medical history of diabetes, coronary artery disease with stent, hypertension. He presents with complaints of "not feeling well". He reports feeling nauseated, weak, no energy, and having generalized malaise for the past several days. He has been vomiting. He reports his sugars have been running nearly 500 during this period of time despite taking his insulin as prescribed. He denies other symptoms such as chest pain, shortness of breath, fevers, or productive cough.  Patient is a 60 y.o. male presenting with vomiting and hyperglycemia. The history is provided by the patient.  Emesis Severity:  Moderate Duration:  2 days Timing:  Constant Progression:  Worsening Chronicity:  New Recent urination:  Normal Relieved by:  Nothing Worsened by:  Nothing tried Ineffective treatments:  None tried Associated symptoms: no abdominal pain, no chills, no diarrhea and no fever   Hyperglycemia Associated symptoms: vomiting   Associated symptoms: no abdominal pain     Past Medical History  Diagnosis Date  . Diabetes type 2, uncontrolled (HCC)   . Hypercholesterolemia   . GERD (gastroesophageal reflux disease)   . Hypertension   . Hemorrhoids     hx of  . Peripheral neuropathy (HCC)   . Depression   . CAD (coronary artery disease)     a. NSTEMI 05/2014 - occluded RCA dominant proximal s/p asp-thrombectomy/DES to RCA, minimal LAD/LCx, EF 50% by cath, 65-70% by echo.  . Barrett's esophagus   . Former tobacco use   . MI (myocardial infarction) [facility])    Past Surgical History  Procedure Laterality Date  . Tonsillectomy    . Left heart catheterization with  coronary angiogram N/A 06/14/2014    Procedure: LEFT HEART CATHETERIZATION WITH CORONARY ANGIOGRAM;  Surgeon: Corky Crafts, MD;  Location: [facility] CATH LAB;  Service: Cardiovascular;  Laterality: N/A;   Family History  Problem Relation Age of Onset  . Hypertension Mother   . Alzheimer's disease Mother   . Alcohol abuse Father   . Cancer Father     "Throat"  . Diabetes type II Brother   . Heart disease Neg Hx    Social History  Substance Use Topics  . Smoking status: Former Smoker -- 0.25 packs/day for 3 years    Quit date: 11/30/1998  . Smokeless tobacco: Never Used  . Alcohol Use: No    Review of Systems  Constitutional: Negative for chills.  Gastrointestinal: Positive for vomiting. Negative for abdominal pain and diarrhea.  All other systems reviewed and are negative.     Allergies  Review of patient's allergies indicates no known allergies.  Home Medications   Prior to Admission medications   Medication Sig Start Date End Date Taking? Authorizing Provider  ACCU-CHEK SOFTCLIX LANCETS lancets Use as instructed to check blood sugar 3 times per day dx code 250.02 07/10/14   Reather Littler, MD  buPROPion (WELLBUTRIN XL) 300 MG 24 hr tablet TAKE ONE TABLET BY MOUTH ONCE DAILY 08/30/15   Sherren Mocha, MD  EFFIENT 10 MG TABS tablet TAKE ONE TABLET BY MOUTH ONCE DAILY (PATIENT  NEEDS  TO  SCHEDULE  APPOINTMENT  FOR  ADDITIONAL  REFILLS  -  2ND  ATTEMPT  02/04/16) 03/10/16  Corky Crafts, MD  EQ ASPIRIN ADULT LOW DOSE 81 MG EC tablet TAKE ONE TABLET BY MOUTH ONCE DAILY 07/02/15   Corky Crafts, MD  gabapentin (NEURONTIN) 300 MG capsule Take 300 mg by mouth 3 (three) times daily.    Historical Provider, MD  glucose blood (ACCU-CHEK AVIVA PLUS) test strip Use as instructed to check blood sugar 3 times per day dx code E11.59 11/19/14   Reather Littler, MD  HYDROcodone-acetaminophen (NORCO/VICODIN) 5-325 MG per tablet Take 1 tablet by mouth every 6 (six) hours as needed for moderate pain  (pain).     Historical Provider, MD  Insulin Glargine (LANTUS SOLOSTAR) 100 UNIT/ML Solostar Pen INJECT 40 UNITS SUBCUTANEOUSLY IN THE MORNING AND 40 UNITS IN THE EVENING 06/16/15   Sherren Mocha, MD  Insulin Pen Needle (B-D UF III MINI PEN NEEDLES) 31G X 5 MM MISC Use to inject insulin 5 pen needles per day 07/10/14   Reather Littler, MD  isosorbide mononitrate (IMDUR) 30 MG 24 hr tablet Take 1 tablet (30 mg total) by mouth daily. 07/02/14   Rosalio Macadamia, NP  LANTUS SOLOSTAR 100 UNIT/ML Solostar Pen INJECT 70 UNITS SUBCUTANEOUSLY ONCE DAILY AT  10  PM 02/07/16   Sherren Mocha, MD  lisinopril (PRINIVIL,ZESTRIL) 2.5 MG tablet Take 1 tablet (2.5 mg total) by mouth daily. 02/05/15   Corky Crafts, MD  metoprolol tartrate (LOPRESSOR) 25 MG tablet TAKE ONE-HALF TABLET BY MOUTH TWICE DAILY 07/02/15   Corky Crafts, MD  nitroGLYCERIN (NITROSTAT) 0.4 MG SL tablet Place 1 tablet (0.4 mg total) under the tongue every 5 (five) minutes as needed for chest pain (up to 3 doses). 06/18/14   Dayna N Dunn, PA-C  NOVOLOG FLEXPEN 100 UNIT/ML FlexPen INJECT 30 TO 35 UNITS SUBCUTANEOUSLY THREE TIMES DAILY 04/07/16   Sherren Mocha, MD  pantoprazole (PROTONIX) 40 MG tablet TAKE ONE TABLET BY MOUTH ONCE DAILY. 06/16/15   Sherren Mocha, MD  rosuvastatin (CRESTOR) 40 MG tablet Take 1 tablet (40 mg total) by mouth daily. 09/21/15   Sherren Mocha, MD   BP 144/94 mmHg  Pulse 109  Temp(Src) 97.8 F (36.6 C) (Oral)  Resp 15  Ht  (1.753 m)  Wt 210 lb (95.255 kg)  BMI 31.00 kg/m2  SpO2 98% Physical Exam  Constitutional: He is oriented to person, place, and time. He appears well-developed and well-nourished. No distress.  HENT:  Head: Normocephalic and atraumatic.  Mucous membranes are somewhat dry.  Neck: Normal range of motion. Neck supple.  Cardiovascular: Normal rate and regular rhythm.  Exam reveals no friction rub.   No murmur heard. Pulmonary/Chest: Effort normal and breath sounds normal. No respiratory distress. He has no  wheezes. He has no rales.  Abdominal: Soft. Bowel sounds are normal. He exhibits no distension. There is no tenderness.  Musculoskeletal: Normal range of motion. He exhibits no edema or tenderness.  Neurological: He is alert and oriented to person, place, and time. No cranial nerve deficit. He exhibits normal muscle tone. Coordination normal.  Skin: Skin is warm and dry. He is not diaphoretic.  Nursing note and vitals reviewed.   ED Course  Procedures (including critical care time) Labs Review Labs Reviewed  CBG MONITORING, ED - Abnormal; Notable for the following:    Glucose-Capillary 467 (*)    All other components within normal limits  BASIC METABOLIC PANEL  CBC  URINALYSIS, ROUTINE W REFLEX MICROSCOPIC (NOT AT [facility])  CBG MONITORING, ED  I-STAT  TROPOININ, ED    Imaging Review No results found. I have personally reviewed and evaluated these images and lab results as part of my medical decision-making.   EKG Interpretation   Date/Time:  Sunday Apr 12 2016 11:07:19 EDT Ventricular Rate:  108 PR Interval:  121 QRS Duration: 77 QT Interval:  323 QTC Calculation: 433 R Axis:   41 Text Interpretation:  Sinus tachycardia Confirmed by Flavia Bruss  MD, Alvino Lechuga  ([id number]) on 04/12/2016 11:26:07 AM      MDM   Final diagnoses:  None    Patient presents with complaints of generalized fatigue, weakness, and vomiting for the past 2 days. He is diabetic and his sugars have been running high. His electrolyte panel reveals him to be in diabetic ketoacidosis. His CO2 is 15 and anion gap is 18. Today sugars in excess of 500. He was started on the glucose stabilizer and will be admitted to the hospitalist service under the care of Dr. Vanessa Barbara. The remainder of his workup is unremarkable. There appears to be no infectious or cardiac cause that would be precipitating this.  CRITICAL CARE Performed by: Geoffery Lyons Total critical care time: 30 minutes Critical care time was exclusive of  separately billable procedures and treating other patients. Critical care was necessary to treat or prevent imminent or life-threatening deterioration. Critical care was time spent personally by me on the following activities: development of treatment plan with patient and/or surrogate as well as nursing, discussions with consultants, evaluation of patient's response to treatment, examination of patient, obtaining history from patient or surrogate, ordering and performing treatments and interventions, ordering and review of laboratory studies, ordering and review of radiographic studies, pulse oximetry and re-evaluation of patient's condition.     Geoffery Lyons, MD 04/12/16 [phone]

## 2016-04-12 NOTE — H&P (Signed)
History and Physical    ZOLLIE ELLERY WJX:[id number] DOB: June 04, 1956 DOA: 04/12/2016  PCP: Norberto Sorenson, MD  Patient coming from: [facility]  Chief Complaint: Nausea/vomiting/elevated blood sugars  HPI: STEPHAUN MILLION is a 60 y.o. male with medical history significant of coronary artery disease, insulin dependent diabetes mellitus who presents to the emergency department with complaints of nausea vomiting. He states that in the last 24-48 hours he has experienced increasing generalized weakness, fatigue, lethargy, malaise as well as having dry mouth and polyuria. States this morning having blood sugars in the 500 range. He also reports developing multiple episodes of nausea and vomiting this morning characterized as nonbloody and of gastric content. He also reports generalized abdominal pain. He denies missing insulin doses. He states that current symptoms are similar to previous episodes of diabetic ketoacidosis. He denies cough, sputum production, short of breath, fevers, chills, dysuria, hematuria.  ED Course: In the emergency department he was found have a blood sugar of 562 with an anion gap of 18 and bicarbonate of 15. Urine revealed the presence of ketones. He was started on IV insulin and IV fluids.  Review of Systems: As per HPI otherwise 10 point review of systems negative.    Past Medical History  Diagnosis Date  . Diabetes type 2, uncontrolled (HCC)   . Hypercholesterolemia   . GERD (gastroesophageal reflux disease)   . Hypertension   . Hemorrhoids     hx of  . Peripheral neuropathy (HCC)   . Depression   . CAD (coronary artery disease)     a. NSTEMI 05/2014 - occluded RCA dominant proximal s/p asp-thrombectomy/DES to RCA, minimal LAD/LCx, EF 50% by cath, 65-70% by echo.  . Barrett's esophagus   . Former tobacco use   . MI (myocardial infarction) [facility])     Past Surgical History  Procedure Laterality Date  . Tonsillectomy    . Left heart catheterization with coronary angiogram  N/A 06/14/2014    Procedure: LEFT HEART CATHETERIZATION WITH CORONARY ANGIOGRAM;  Surgeon: Corky Crafts, MD;  Location: [facility] CATH LAB;  Service: Cardiovascular;  Laterality: N/A;     reports that he quit smoking about 17 years ago. He has never used smokeless tobacco. He reports that he does not drink alcohol or use illicit drugs.  No Known Allergies  Family History  Problem Relation Age of Onset  . Hypertension Mother   . Alzheimer's disease Mother   . Alcohol abuse Father   . Cancer Father     "Throat"  . Diabetes type II Brother   . Heart disease Neg Hx     Prior to Admission medications   Medication Sig Start Date End Date Taking? Authorizing Provider  ACCU-CHEK SOFTCLIX LANCETS lancets Use as instructed to check blood sugar 3 times per day dx code 250.02 07/10/14  Yes Reather Littler, MD  buPROPion (WELLBUTRIN XL) 300 MG 24 hr tablet TAKE ONE TABLET BY MOUTH ONCE DAILY 08/30/15  Yes Sherren Mocha, MD  EFFIENT 10 MG TABS tablet TAKE ONE TABLET BY MOUTH ONCE DAILY (PATIENT  NEEDS  TO  SCHEDULE  APPOINTMENT  FOR  ADDITIONAL  REFILLS  -  2ND  ATTEMPT  02/04/16) 03/10/16  Yes Corky Crafts, MD  EQ ASPIRIN ADULT LOW DOSE 81 MG EC tablet TAKE ONE TABLET BY MOUTH ONCE DAILY 07/02/15  Yes Corky Crafts, MD  glucose blood (ACCU-CHEK AVIVA PLUS) test strip Use as instructed to check blood sugar 3 times per day dx code E11.59 11/19/14  Yes Reather Littler, MD  HYDROcodone-acetaminophen (NORCO/VICODIN) 5-325 MG per tablet Take 1 tablet by mouth every 6 (six) hours as needed for moderate pain (pain).    Yes Historical Provider, MD  Insulin Glargine (LANTUS SOLOSTAR) 100 UNIT/ML Solostar Pen INJECT 40 UNITS SUBCUTANEOUSLY IN THE MORNING AND 40 UNITS IN THE EVENING Patient taking differently: Inject 35 Units into the skin 2 (two) times daily.  06/16/15  Yes Sherren Mocha, MD  metoprolol tartrate (LOPRESSOR) 25 MG tablet TAKE ONE-HALF TABLET BY MOUTH TWICE DAILY 07/02/15  Yes Corky Crafts, MD    nitroGLYCERIN (NITROSTAT) 0.4 MG SL tablet Place 1 tablet (0.4 mg total) under the tongue every 5 (five) minutes as needed for chest pain (up to 3 doses). 06/18/14  Yes Dayna N Dunn, PA-C  NOVOLOG FLEXPEN 100 UNIT/ML FlexPen INJECT 30 TO 35 UNITS SUBCUTANEOUSLY THREE TIMES DAILY Patient taking differently: Inject 20 to 25 units before meals 3 times daily. 04/07/16  Yes Sherren Mocha, MD  pantoprazole (PROTONIX) 40 MG tablet TAKE ONE TABLET BY MOUTH ONCE DAILY. 06/16/15  Yes Sherren Mocha, MD  Insulin Pen Needle (B-D UF III MINI PEN NEEDLES) 31G X 5 MM MISC Use to inject insulin 5 pen needles per day 07/10/14   Reather Littler, MD  isosorbide mononitrate (IMDUR) 30 MG 24 hr tablet Take 1 tablet (30 mg total) by mouth daily. 07/02/14   Rosalio Macadamia, NP  LANTUS SOLOSTAR 100 UNIT/ML Solostar Pen INJECT 70 UNITS SUBCUTANEOUSLY ONCE DAILY AT  10  PM Patient not taking: Reported on 04/12/2016 02/07/16   Sherren Mocha, MD  lisinopril (PRINIVIL,ZESTRIL) 2.5 MG tablet Take 1 tablet (2.5 mg total) by mouth daily. Patient not taking: Reported on 04/12/2016 02/05/15   Corky Crafts, MD  rosuvastatin (CRESTOR) 40 MG tablet Take 1 tablet (40 mg total) by mouth daily. Patient not taking: Reported on 04/12/2016 09/21/15   Sherren Mocha, MD    Physical Exam: Filed Vitals:   04/12/16 1100 04/12/16 1205  BP: 144/94 128/75  Pulse: 109 100  Temp: 97.8 F (36.6 C)   TempSrc: Oral   Resp: 15 16  Height: 5\' 9"  (1.753 m)   Weight: 95.255 kg (210 lb)   SpO2: 98% 97%      Constitutional: Ill-appearing, mildly lethargic however no acute distress able to provide history. Filed Vitals:   04/12/16 1100 04/12/16 1205  BP: 144/94 128/75  Pulse: 109 100  Temp: 97.8 F (36.6 C)   TempSrc: Oral   Resp: 15 16  Height: 5\' 9"  (1.753 m)   Weight: 95.255 kg (210 lb)   SpO2: 98% 97%   Eyes: PERRL, lids and conjunctivae normal ENMT: Mucous membranes are moist. Posterior pharynx clear of any exudate or lesions.Normal dentition.Dry oral  mucosa  Neck: normal, supple, no masses, no thyromegaly, no jugular venous distention Respiratory: clear to auscultation bilaterally, no wheezing, no crackles. Normal respiratory effort. No accessory muscle use.  Cardiovascular: Tachycardic, Regular rate and rhythm, no murmurs / rubs / gallops. No extremity edema. 2+ pedal pulses. No carotid bruits.  Abdomen: Has mild generalized tenderness to palpation, no masses palpated. No hepatosplenomegaly. Bowel sounds positive.  Musculoskeletal: no clubbing / cyanosis. No joint deformity upper and lower extremities. Good ROM, no contractures. Normal muscle tone.  Skin: no rashes, lesions, ulcers. No induration Neurologic: CN 2-12 grossly intact. Sensation intact, DTR normal. Strength 5/5 in all 4.  Psychiatric: Appears mildly lethargic however has normal judgment and insight. Alert and oriented x  3. Normal mood.    Labs on Admission: I have personally reviewed following labs and imaging studies  CBC:  Recent Labs Lab 04/12/16 1202  WBC 6.8  HGB 14.5  HCT 43.3  MCV 83.3  PLT 268   Basic Metabolic Panel:  Recent Labs Lab 04/12/16 1202  NA 133*  K 4.7  CL 100*  CO2 15*  GLUCOSE 562*  BUN 22*  CREATININE 1.21  CALCIUM 9.1   GFR: Estimated Creatinine Clearance: 76.7 mL/min (by C-G formula based on Cr of 1.21). Liver Function Tests: No results for input(s): AST, ALT, ALKPHOS, BILITOT, PROT, ALBUMIN in the last 168 hours. No results for input(s): LIPASE, AMYLASE in the last 168 hours. No results for input(s): AMMONIA in the last 168 hours. Coagulation Profile: No results for input(s): INR, PROTIME in the last 168 hours. Cardiac Enzymes: No results for input(s): CKTOTAL, CKMB, CKMBINDEX, TROPONINI in the last 168 hours. BNP (last 3 results) No results for input(s): PROBNP in the last 8760 hours. HbA1C: No results for input(s): HGBA1C in the last 72 hours. CBG:  Recent Labs Lab 04/12/16 1112 04/12/16 1332  GLUCAP 467* 477*    Lipid Profile: No results for input(s): CHOL, HDL, LDLCALC, TRIG, CHOLHDL, LDLDIRECT in the last 72 hours. Thyroid Function Tests: No results for input(s): TSH, T4TOTAL, FREET4, T3FREE, THYROIDAB in the last 72 hours. Anemia Panel: No results for input(s): VITAMINB12, FOLATE, FERRITIN, TIBC, IRON, RETICCTPCT in the last 72 hours. Urine analysis:    Component Value Date/Time   COLORURINE YELLOW 04/12/2016 1133   APPEARANCEUR CLEAR 04/12/2016 1133   LABSPEC 1.031* 04/12/2016 1133   PHURINE 5.0 04/12/2016 1133   GLUCOSEU >1000* 04/12/2016 1133   HGBUR NEGATIVE 04/12/2016 1133   BILIRUBINUR NEGATIVE 04/12/2016 1133   BILIRUBINUR negative 09/19/2015 1712   KETONESUR >80* 04/12/2016 1133   KETONESUR negative 09/19/2015 1712   PROTEINUR NEGATIVE 04/12/2016 1133   PROTEINUR negative 09/19/2015 1712   UROBILINOGEN 0.2 09/19/2015 1712   UROBILINOGEN 0.2 06/14/2014 0809   NITRITE NEGATIVE 04/12/2016 1133   NITRITE Negative 09/19/2015 1712   LEUKOCYTESUR NEGATIVE 04/12/2016 1133   Sepsis Labs: !!!!!!!!!!!!!!!!!!!!!!!!!!!!!!!!!!!!!!!!!!!! (procalcitonin:4,lacticidven:4) )No results found for this or any previous visit (from the past 240 hour(s)).   Radiological Exams on Admission: Dg Chest 2 View  04/12/2016  CLINICAL DATA:  Weakness, vomiting for 2 days EXAM: CHEST  2 VIEW COMPARISON:  None. FINDINGS: Cardiomediastinal silhouette is stable. No acute infiltrate or pleural effusion. No pulmonary edema. Bony thorax is unremarkable. IMPRESSION: No active cardiopulmonary disease. Electronically Signed   By: Natasha Mead M.D.   On: 04/12/2016 13:16    EKG: Independently reviewed.   Assessment/Plan Principal Problem:   DKA (diabetic ketoacidoses) (HCC) Active Problems:   DM (diabetes mellitus), type 2, uncontrolled (HCC)   Peripheral neuropathy (HCC)   CAD (coronary artery disease)   Diabetic ketoacidosis (HCC)  1.   Diabetic ketoacidosis. Mr Roehrig is a 60 year old gentleman  with a past medical history of insulin dependent diabetes mellitus, who is on 70 units of Lantus with NovoLog mealtime coverage at home, presenting with complaints of nausea vomiting, polyuria, polydipsia. He was found have a blood sugar in the 500s with the presence of an anion gap metabolic acidosis. Will place him on the DKA protocol, treat with IV insulin, IV fluids, replacement of electrolytes as needed, follow serial BMPs and Accu-Cheks, admit to the step down unit for close monitoring and further treatment. Unclear what may have precipitated DKA. He states compliance to his  insulin regimen. Labs did not reveal an obvious source of infection.  2.  Coronary artery disease. He has a history of non-ST segment elevation myocardial infarction, undergoing a cardiac catheterization in July 2015 that revealed occluded RCA status post percutaneous intervention with drug-eluting stent. He does not appear to be having acute cardiac issues at this time, initial troponin was negative, EKG did not reveal acute ischemic changes. He remains on dual agent antiplatelet therapy with Effient and aspirin which will be continued during this hospitalization.  3.  Hypertension. Will continue metoprolol 12.5 mg by mouth twice a day, lisinopril 2.5 mg by mouth daily and Imdur 30 mg by mouth daily.   4.  Dyslipidemia. Continue Crestor 40 mg by mouth daily   DVT prophylaxis: Lovenox Code Status: Full code Family Communication: Family not present Disposition Plan: Anticipate he'll require greater than 2 nights hospitalization Consults called: Admission status: Step down unit   Jeralyn Bennett MD [facility] Pager [phone]  If 7PM-7AM, please contact night-coverage [email or web address] Password [email or web address]  04/12/2016, 1:42 PM

## 2016-04-12 NOTE — Progress Notes (Signed)
Cardiac assessment within normal limits-NSR on monitor

## 2016-04-12 NOTE — ED Notes (Signed)
Bed: WA17 Expected date:  Expected time:  Means of arrival:  Comments: Hold triage 1

## 2016-04-12 NOTE — ED Notes (Signed)
Pt c/o weakness and vomiting x2 days. 2 episodes of vomiting last night and this morning. Diarrhea x10 in the past 2 days. Also c/o groin pain which is chronic.  Hx of DM

## 2016-04-13 DIAGNOSIS — G588 Other specified mononeuropathies: Secondary | ICD-10-CM

## 2016-04-13 LAB — BASIC METABOLIC PANEL
ANION GAP: 7 (ref 5–15)
ANION GAP: 6 (ref 5–15)
BUN: 15 mg/dL (ref 6–20)
BUN: 14 mg/dL (ref 6–20)
CALCIUM: 8.5 mg/dL — AB (ref 8.9–10.3)
CALCIUM: 8.3 mg/dL — AB (ref 8.9–10.3)
CO2: 20 mmol/L — ABNORMAL LOW (ref 22–32)
CO2: 20 mmol/L — AB (ref 22–32)
CREATININE: 0.97 mg/dL (ref 0.61–1.24)
Chloride: 110 mmol/L (ref 101–111)
Chloride: 110 mmol/L (ref 101–111)
Creatinine, Ser: 0.96 mg/dL (ref 0.61–1.24)
GFR calc Af Amer: >60 mL/min (ref >60)
Glucose, Bld: 195 mg/dL — ABNORMAL HIGH (ref 65–99)
Glucose, Bld: 199 mg/dL — ABNORMAL HIGH (ref 65–99)
POTASSIUM: 4.2 mmol/L (ref 3.5–5.1)
Potassium: 3.7 mmol/L (ref 3.5–5.1)
SODIUM: 137 mmol/L (ref 135–145)
SODIUM: 136 mmol/L (ref 135–145)

## 2016-04-13 LAB — CBC
HEMATOCRIT: 39.7 % (ref 39.0–52.0)
Hemoglobin: 13.4 g/dL (ref 13.0–17.0)
MCH: 27.6 pg (ref 26.0–34.0)
MCHC: 33.8 g/dL (ref 30.0–36.0)
MCV: 81.7 fL (ref 78.0–100.0)
Platelets: 268 10*3/uL (ref 150–400)
RBC: 4.86 MIL/uL (ref 4.22–5.81)
RDW: 13.9 % (ref 11.5–15.5)
WBC: 5.4 10*3/uL (ref 4.0–10.5)

## 2016-04-13 LAB — GLUCOSE, CAPILLARY
GLUCOSE-CAPILLARY: 152 mg/dL — AB (ref 65–99)
GLUCOSE-CAPILLARY: 163 mg/dL — AB (ref 65–99)
GLUCOSE-CAPILLARY: 175 mg/dL — AB (ref 65–99)
GLUCOSE-CAPILLARY: 180 mg/dL — AB (ref 65–99)
GLUCOSE-CAPILLARY: 185 mg/dL — AB (ref 65–99)
GLUCOSE-CAPILLARY: 200 mg/dL — AB (ref 65–99)
Glucose-Capillary: 194 mg/dL — ABNORMAL HIGH (ref 65–99)
Glucose-Capillary: 176 mg/dL — ABNORMAL HIGH (ref 65–99)
Glucose-Capillary: 143 mg/dL — ABNORMAL HIGH (ref 65–99)
Glucose-Capillary: 184 mg/dL — ABNORMAL HIGH (ref 65–99)
Glucose-Capillary: 236 mg/dL — ABNORMAL HIGH (ref 65–99)
Glucose-Capillary: 226 mg/dL — ABNORMAL HIGH (ref 65–99)

## 2016-04-13 MED ORDER — INSULIN ASPART 100 UNIT/ML ~~LOC~~ SOLN
0.0000 [IU] | Freq: Three times a day (TID) | SUBCUTANEOUS | Status: DC
  Administered 2016-04-13: 2 [IU] via SUBCUTANEOUS
  Administered 2016-04-13: 5 [IU] via SUBCUTANEOUS
  Administered 2016-04-13: 3 [IU] via SUBCUTANEOUS
  Administered 2016-04-14: 0 [IU] via SUBCUTANEOUS

## 2016-04-13 MED ORDER — INSULIN ASPART 100 UNIT/ML ~~LOC~~ SOLN
6.0000 [IU] | Freq: Three times a day (TID) | SUBCUTANEOUS | Status: DC
  Administered 2016-04-13 – 2016-04-14 (×2): 6 [IU] via SUBCUTANEOUS

## 2016-04-13 MED ORDER — INSULIN GLARGINE 100 UNIT/ML ~~LOC~~ SOLN
35.0000 [IU] | Freq: Two times a day (BID) | SUBCUTANEOUS | Status: DC
  Administered 2016-04-13 – 2016-04-14 (×3): 35 [IU] via SUBCUTANEOUS
  Filled 2016-04-13 (×4): qty 0.35

## 2016-04-13 NOTE — Progress Notes (Signed)
Inpatient Diabetes Program Recommendations  AACE/ADA: New Consensus Statement on Inpatient Glycemic Control (2015)  Target Ranges:  Prepandial:   less than 140 mg/dL      Peak postprandial:   less than 180 mg/dL (1-2 hours)      Critically ill patients:  140 - 180 mg/dL   Review of Glycemic Control  Diabetes history: DM2 Outpatient Diabetes medications: Lantus 35 units bid, Novolog 20-25 units tid Current orders for Inpatient glycemic control: Lantus 35 units bid, Novolog moderate tid  Results for [name] [name], [name] [name] (MRN [id number]) as of 04/13/2016 10:45  Ref. Range 04/13/2016 06:15  Sodium Latest Ref Range: 135-145 mmol/L 136  Potassium Latest Ref Range: 3.5-5.1 mmol/L 3.7  Chloride Latest Ref Range: 101-111 mmol/L 110  CO2 Latest Ref Range: 22-32 mmol/L 20 (L)  BUN Latest Ref Range: 6-20 mg/dL 14  Creatinine Latest Ref Range: 0.61-1.24 mg/dL 0.97  Calcium Latest Ref Range: 8.9-10.3 mg/dL 8.3 (L)  EGFR (Non-African Amer.) Latest Ref Range: >60 mL/min >60  EGFR (African American) Latest Ref Range: >60 mL/min >60  Glucose Latest Ref Range: 65-99 mg/dL 199 (H)  Anion gap Latest Ref Range: 5-15  6  Results for [name] [name], [name] [name] (MRN [id number]) as of 04/13/2016 10:45  Ref. Range 09/19/2015 17:13  Hemoglobin A1C Unknown 8.4  Results for [name] [name], [name] [name] (MRN [id number]) as of 04/13/2016 10:45  Ref. Range 04/13/2016 04:41 04/13/2016 05:54 04/13/2016 06:46 04/13/2016 07:51 04/13/2016 09:06  Glucose-Capillary Latest Ref Range: 65-99 mg/dL 200 (H) 185 (H) 175 (H) 143 (H) 163 (H)   Will need meal coverage insulin when eating CHO mod diet.  Inpatient Diabetes Program Recommendations:    Add Novolog 6 units tidwc for meal coverage insulin. Add HS correction. HgbA1C - to assess glycemic control prior to hospitalization  Will speak with pt this afternoon regarding his diabetes control at home.  Thank you. Lorenda Peck, RD, LDN, CDE Inpatient Diabetes Coordinator [phone]

## 2016-04-13 NOTE — Progress Notes (Signed)
PROGRESS NOTE    [name] [name]  ZOX:[id number] DOB: 26-Feb-1956 DOA: 04/12/2016 PCP: Norberto Sorenson, MD    Brief Narrative:  60 y.o. male with medical history significant of coronary artery disease, insulin dependent diabetes mellitus who presents to the emergency department with complaints of nausea vomiting. He states that in the last 24-48 hours he has experienced increasing generalized weakness, fatigue, lethargy, malaise as well as having dry mouth and polyuria. States this morning having blood sugars in the 500 range. Patient was found to have anion gap of 18 with urinary ketones. Pt was admitted for further work up.   Assessment & Plan:   Principal Problem:   DKA (diabetic ketoacidoses) (HCC) Active Problems:   DM (diabetes mellitus), type 2, uncontrolled (HCC)   Peripheral neuropathy (HCC)   CAD (coronary artery disease)   Diabetic ketoacidosis (HCC)   1. Diabetic ketoacidosis. [name] [name] is a 60 year old gentleman with a past medical history of insulin dependent diabetes mellitus, who is on 70 units of Lantus with NovoLog mealtime coverage at home, presenting with complaints of nausea vomiting, polyuria, polydipsia. He was found have a blood sugar in the 500s with the presence of an anion gap metabolic acidosis.  -Patient was admitted to stepdown level of care. -Patient was continued on insulin drip and IV fluid hydration with resolution of DKA overnight. -Patient has since been resumed on his home regimen. -Patient claims compliance with his regimen. No obvious infectious etiology.  2. Coronary artery disease. He has a history of non-ST segment elevation myocardial infarction, undergoing a cardiac catheterization in July 2015 that revealed occluded RCA status post percutaneous intervention with drug-eluting stent. He does not appear to be having acute cardiac issues at this time, initial troponin was negative, EKG did not reveal acute ischemic changes. He remains on dual agent  antiplatelet therapy with Effient and aspirin which will be continued during this hospitalization.  3. Hypertension. Will continue metoprolol 12.5 mg by mouth twice a day, lisinopril 2.5 mg by mouth daily and Imdur 30 mg by mouth daily.   4. Dyslipidemia. Continue Crestor 40 mg by mouth daily  5. Posterior calf tenderness - Patient reports a two-week history of "knot" over the posterior left calf. - Will obtain lower extremity Doppler while patient is hospitalized - For now, continues prophylactic anticoagulation.   DVT prophylaxis: Lovenox Code Status: Full Family Communication: Patient in room, family not at bedside Disposition Plan: Possible discharge in 04/14/2016, home   Consultants:     Procedures:     Antimicrobials:      Subjective: Patient reports feeling better overall. Does complain of posterior calf tenderness however.  Objective: Filed Vitals:   04/13/16 1200 04/13/16 1300 04/13/16 1400 04/13/16 1500  BP: 118/71     Pulse: 80     Temp: 98.1 F (36.7 C)     TempSrc: Oral     Resp: 0  Height:      Weight:      SpO2: 96% 97% 97% 95%    Intake/Output Summary (Last 24 hours) at 04/13/16 1639 Last data filed at 04/13/16 1408  Gross per 24 hour  Intake 2334.3 ml  Output    500 ml  Net 1834.3 ml   Filed Weights   04/12/16 1100 04/12/16 1415  Weight: 95.255 kg (210 lb) 96.8 kg (213 lb 6.5 oz)    Examination:  General exam: Appears calm and comfortable  Respiratory system: Clear to auscultation. Respiratory effort normal. Cardiovascular system: S1 & S2  heard, RRR.  Gastrointestinal system: Abdomen is nondistended, soft and nontender. No organomegaly or masses felt. Normal bowel sounds heard. Central nervous system: Alert and oriented. No focal neurological deficits. Extremities: Symmetric 5 x 5 power, post L calf with tender palpable knot on exam Skin: No rashes, lesions Psychiatry: Judgement and insight appear normal. Mood & affect  appropriate.     Data Reviewed: I have personally reviewed following labs and imaging studies  CBC:  Recent Labs Lab 04/12/16 1202 04/12/16 1437 04/13/16 0209  WBC 6.8 8.8 5.4  HGB 14.5 14.8 13.4  HCT 43.3 44.6 39.7  MCV 83.3 83.2 81.7  PLT 268 281 268   Basic Metabolic Panel:  Recent Labs Lab 04/12/16 1437 04/12/16 1808 04/12/16 2228 04/13/16 0209 04/13/16 0615  NA 137 136 138 137 136  K 4.5 4.3 4.0 4.2 3.7  CL 108 109 111 110 110  CO2 14* 20* 22 20* 20*  GLUCOSE 457* 208* 101* 195* 199*  BUN 22* 19 18 15 14   CREATININE 1.30*  1.26* 0.97 0.91 0.96 0.97  CALCIUM 9.0 8.8* 8.8* 8.5* 8.3*   GFR: Estimated Creatinine Clearance: 96.4 mL/min (by C-G formula based on Cr of 0.97). Liver Function Tests: No results for input(s): AST, ALT, ALKPHOS, BILITOT, PROT, ALBUMIN in the last 168 hours. No results for input(s): LIPASE, AMYLASE in the last 168 hours. No results for input(s): AMMONIA in the last 168 hours. Coagulation Profile: No results for input(s): INR, PROTIME in the last 168 hours. Cardiac Enzymes: No results for input(s): CKTOTAL, CKMB, CKMBINDEX, TROPONINI in the last 168 hours. BNP (last 3 results) No results for input(s): PROBNP in the last 8760 hours. HbA1C: No results for input(s): HGBA1C in the last 72 hours. CBG:  Recent Labs Lab 04/13/16 0646 04/13/16 0751 04/13/16 0906 04/13/16 1049 04/13/16 1157  GLUCAP 175* 143* 163* 180* 184*   Lipid Profile: No results for input(s): CHOL, HDL, LDLCALC, TRIG, CHOLHDL, LDLDIRECT in the last 72 hours. Thyroid Function Tests: No results for input(s): TSH, T4TOTAL, FREET4, T3FREE, THYROIDAB in the last 72 hours. Anemia Panel: No results for input(s): VITAMINB12, FOLATE, FERRITIN, TIBC, IRON, RETICCTPCT in the last 72 hours. Sepsis Labs: No results for input(s): PROCALCITON, LATICACIDVEN in the last 168 hours.  Recent Results (from the past 240 hour(s))  MRSA PCR Screening     Status: None   Collection  Time: 04/12/16  2:16 PM  Result Value Ref Range Status   MRSA by PCR NEGATIVE NEGATIVE Final    Comment:        The GeneXpert MRSA Assay (FDA approved for NASAL specimens only), is one component of a comprehensive MRSA colonization surveillance program. It is not intended to diagnose MRSA infection nor to guide or monitor treatment for MRSA infections.          Radiology Studies: Dg Chest 2 View  04/12/2016  CLINICAL DATA:  Weakness, vomiting for 2 days EXAM: CHEST  2 VIEW COMPARISON:  None. FINDINGS: Cardiomediastinal silhouette is stable. No acute infiltrate or pleural effusion. No pulmonary edema. Bony thorax is unremarkable. IMPRESSION: No active cardiopulmonary disease. Electronically Signed   By: Natasha Mead M.D.   On: 04/12/2016 13:16        Scheduled Meds: . aspirin  81 mg Oral Daily  . buPROPion  300 mg Oral Daily  . enoxaparin (LOVENOX) injection  40 mg Subcutaneous Q24H  . insulin aspart  0-15 Units Subcutaneous TID WC  . insulin aspart  6 Units Subcutaneous TID WC  .  insulin glargine  35 Units Subcutaneous BID  . isosorbide mononitrate  30 mg Oral Daily  . lisinopril  2.5 mg Oral Daily  . metoprolol tartrate  12.5 mg Oral BID  . pantoprazole  40 mg Oral Q breakfast  . prasugrel  10 mg Oral Daily   Continuous Infusions:    LOS: 1 day    Ommie Degeorge, Scheryl Marten, MD [facility] Pager [phone] If 7PM-7AM, please contact night-coverage [email or web address] Password [facility] 04/13/2016, 4:39 PM

## 2016-04-13 NOTE — Care Management Note (Signed)
Case Management Note  Patient Details  Name: [name] [name] MRN: [id number] Date of Birth: 1956-08-08  Subjective/Objective:                 dka   Action/Plan:Date:  Apr 13, 2016 Chart reviewed for concurrent status and case management needs. Will continue to follow patient for changes and needs: Expected discharge date: [id number] Marcelle Smiling, BSN, [place], [place]   [phone]   Expected Discharge Date:  [id number]               Expected Discharge Plan:  Home/Self Care  In-House Referral:  NA  Discharge planning Services  CM Consult  Post Acute Care Choice:  NA Choice offered to:  NA  DME Arranged:  N/A DME Agency:  NA  HH Arranged:  NA HH Agency:  NA  Status of Service:  In process, will continue to follow  Medicare Important Message Given:    Date Medicare IM Given:    Medicare IM give by:    Date Additional Medicare IM Given:    Additional Medicare Important Message give by:     If discussed at Long Length of Stay Meetings, dates discussed:    Additional Comments:  Golda Acre, RN 04/13/2016, 10:20 AM

## 2016-04-14 ENCOUNTER — Inpatient Hospital Stay (HOSPITAL_COMMUNITY): Payer: Commercial Managed Care - HMO

## 2016-04-14 DIAGNOSIS — I82409 Acute embolism and thrombosis of unspecified deep veins of unspecified lower extremity: Secondary | ICD-10-CM

## 2016-04-14 LAB — BASIC METABOLIC PANEL
ANION GAP: 7 (ref 5–15)
BUN: 20 mg/dL (ref 6–20)
CALCIUM: 8.6 mg/dL — AB (ref 8.9–10.3)
CHLORIDE: 111 mmol/L (ref 101–111)
CO2: 20 mmol/L — AB (ref 22–32)
Creatinine, Ser: 0.87 mg/dL (ref 0.61–1.24)
GFR calc non Af Amer: >60 mL/min (ref >60)
GLUCOSE: 131 mg/dL — AB (ref 65–99)
POTASSIUM: 3.8 mmol/L (ref 3.5–5.1)
Sodium: 138 mmol/L (ref 135–145)

## 2016-04-14 LAB — GLUCOSE, CAPILLARY
GLUCOSE-CAPILLARY: 100 mg/dL — AB (ref 65–99)
Glucose-Capillary: 92 mg/dL (ref 65–99)
Glucose-Capillary: 85 mg/dL (ref 65–99)

## 2016-04-14 LAB — CBC
HEMATOCRIT: 39.3 % (ref 39.0–52.0)
HEMOGLOBIN: 13.2 g/dL (ref 13.0–17.0)
MCH: 27.5 pg (ref 26.0–34.0)
MCHC: 33.6 g/dL (ref 30.0–36.0)
MCV: 81.9 fL (ref 78.0–100.0)
Platelets: 282 10*3/uL (ref 150–400)
RBC: 4.8 MIL/uL (ref 4.22–5.81)
RDW: 14 % (ref 11.5–15.5)
WBC: 4.7 10*3/uL (ref 4.0–10.5)

## 2016-04-14 NOTE — Discharge Instructions (Addendum)
Diabetes and Sick Day Management Blood sugar (glucose) can be more difficult to control when you are sick. Colds, fever, flu, nausea, vomiting, and diarrhea are all examples of common illnesses that can cause problems for people with diabetes. Loss of body fluids (dehydration) from fever, vomiting, diarrhea, infection, and the stress of a sickness can all cause blood glucose levels to increase. Because of this, it is very important to take your diabetes medicines and to eat some form of carbohydrate food when you are sick. Liquid or soft foods are often tolerated, and they help to replace fluids. HOME CARE INSTRUCTIONS These main guidelines are intended for managing a short-term (24 hours or less) sickness:  Take your usual dose of insulin or oral diabetes medicine. An exception would be if you take any form of metformin. If you cannot eat or drink, you can become dehydrated and should not take this medicine.  Continue to take your insulin even if you are unable to eat solid foods or are vomiting. Your insulin dose may stay the same, or it may need to be increased when you are sick.  You will need to test your blood glucose more often, generally every 2-4 hours. If you have type 1 diabetes, test your urine for ketones every 4 hours. If you have type 2 diabetes, test your urine for ketones as directed by your health care provider.  Eat some form of food that contains carbohydrates. The carbohydrates can be in solid or liquid form. You should eat 45-50 g of carbohydrates every 3-4 hours.  Replace fluids if you have a fever, vomit, or have diarrhea. Ask your health care provider for specific rehydration instructions.  Watch carefully for the signs of ketoacidosis if you have type 1 diabetes. Call your health care provider if any of the following symptoms are present, especially in children:  Moderate to large ketones in the urine along with a high blood glucose level.  Severe  nausea.  Vomiting.  Diarrhea.  Abdominal pain.  Rapid breathing.  Drink extra liquids that do not contain sugar such as water.  Be careful with over-the-counter medicines. Read the labels. They may contain sugar or types of sugars that can increase your blood glucose level. Food Choices for Illness All of the food choices below contain about 15 g of carbohydrates. Plan ahead and keep some of these foods around.    to  cup carbonated beverage containing sugar. Carbonated beverages will usually be better tolerated if they are opened and left at room temperature for a few minutes.   of a twin frozen ice pop.   cup regular gelatin.   cup juice.   cup ice cream or frozen yogurt.   cup cooked cereal.   cup sherbet.  1 cup clear broth or soup.  1 cup cream soup.   cup regular custard.   cup regular pudding.  1 cup sports drink.  1 cup plain yogurt.  1 slice toast.  6 squares saltine crackers.  5 vanilla wafers. SEEK MEDICAL CARE IF:   You are unable to drink fluids, even small amounts.  You have nausea and vomiting for more than 6 hours.  You have diarrhea for more than 6 hours.  Your blood glucose level is more than 240 mg/dL, even with additional insulin.  There is a change in mental status.  You develop an additional serious sickness.  You have been sick for 2 days and are not getting better.  You have a fever. SEEK IMMEDIATE  MEDICAL CARE IF:  You have difficulty breathing.  You have moderate to large ketone levels. MAKE SURE YOU:  Understand these instructions.  Will watch your condition.  Will get help right away if you are not doing well or get worse.   This information is not intended to replace advice given to you by your health care provider. Make sure you discuss any questions you have with your health care provider.   Document Released: 11/19/2003 Document Revised: 12/07/2014 Document Reviewed: 04/25/2013 Elsevier  Interactive Patient Education 2016 [employer].  Diabetic Ketoacidosis Diabetic ketoacidosis is a life-threatening complication of diabetes. If it is not treated, it can cause severe dehydration and organ damage and can lead to a coma or death. CAUSES This condition develops when there is not enough of the hormone insulin in the body. Insulin helps the body to break down sugar for energy. Without insulin, the body cannot break down sugar, so it breaks down fats instead. This leads to the production of acids that are called ketones. Ketones are poisonous at high levels. This condition can be triggered by:  Stress on the body that is brought on by an illness.  Medicines that raise blood glucose levels.  Not taking diabetes medicine. SYMPTOMS Symptoms of this condition include:  Fatigue.  Weight loss.  Excessive thirst.  Light-headedness.  Fruity or sweet-smelling breath.  Excessive urination.  Vision changes.  Confusion or irritability.  Nausea.  Vomiting.  Rapid breathing.  Abdominal pain.  Feeling flushed. DIAGNOSIS This condition is diagnosed based on a medical history, a physical exam, and blood tests. You may also have a urine test that checks for ketones. TREATMENT This condition may be treated with:  Fluid replacement. This may be done to correct dehydration.  Insulin injections. These may be given through the skin or through an IV tube.  Electrolyte replacement. Electrolytes, such as potassium and sodium, may be given in pill form or through an IV tube.  Antibiotic medicines. These may be prescribed if your condition was caused by an infection. HOME CARE INSTRUCTIONS Eating and Drinking  Drink enough fluids to keep your urine clear or pale yellow.  If you cannot eat, alternate between drinking fluids with sugar (such as juice) and salty fluids (such as broth or bouillon).  If you can eat, follow your usual diet and drink sugar-free liquids, such as  water. Other Instructions  Take insulin as directed by your health care provider. Do not skip insulin injections. Do not use expired insulin.  If your blood sugar is over 240 mg/dL, monitor your urine ketones every 4-6 hours.  If you were prescribed an antibiotic medicine, finish all of it even if you start to feel better.  Rest and exercise only as directed by your health care provider.  If you get sick, call your health care provider and begin treatment quickly. Your body often needs extra insulin to fight an illness.  Check your blood glucose levels regularly. If your blood glucose is high, drink plenty of fluids. This helps to flush out ketones. SEEK MEDICAL CARE IF:  Your blood glucose level is too high or too low.  You have ketones in your urine.  You have a fever.  You cannot eat.  You cannot tolerate fluids.  You have been vomiting for more than 2 hours.  You continue to have symptoms of this condition.  You develop new symptoms. SEEK IMMEDIATE MEDICAL CARE IF:  Your blood glucose levels continue to be high (elevated).  Your  monitor reads "high" even when you are taking insulin.  You faint.  You have chest pain.  You have trouble breathing.  You have a sudden, severe headache.  You have sudden weakness in one arm or one leg.  You have sudden trouble speaking or swallowing.  You have vomiting or diarrhea that gets worse after 3 hours.  You feel severely fatigued.  You have trouble thinking.  You have abdominal pain.  You are severely dehydrated. Symptoms of severe dehydration include:  Extreme thirst.  Dry mouth.  Blue lips.  Cold hands and feet.  Rapid breathing.   This information is not intended to replace advice given to you by your health care provider. Make sure you discuss any questions you have with your health care provider.   Document Released: 11/13/2000 Document Revised: 04/02/2015 Document Reviewed: 10/24/2014 Elsevier  Interactive Patient Education [employer]

## 2016-04-14 NOTE — Progress Notes (Signed)
VASCULAR LAB PRELIMINARY  PRELIMINARY  PRELIMINARY  PRELIMINARY  Left lower extremity venous duplex completed.     Left:  No evidence of DVT, superficial thrombosis, or Baker's cyst . Patient has knot in distal calf area no evidence of superifcial thrombus  In that area.  Gave patient's nurse the results.  Charlise Giovanetti, RVT, RDMS 04/14/2016, 11:48 AM

## 2016-04-14 NOTE — Discharge Summary (Signed)
Physician Discharge Summary  [name] [name]:[id number] DOB: 1956/07/10 DOA: 04/12/2016  PCP: Norberto Sorenson, MD  Admit date: 04/12/2016 Discharge date: 04/14/2016  Time spent: 20 minutes  Recommendations for Outpatient Follow-up:  1. Follow up with PCP as scheduled in one week   Discharge Diagnoses:  Principal Problem:   DKA (diabetic ketoacidoses) (HCC) Active Problems:   DM (diabetes mellitus), type 2, uncontrolled (HCC)   Peripheral neuropathy (HCC)   CAD (coronary artery disease)   Diabetic ketoacidosis (HCC)   Discharge Condition: Improved  Diet recommendation: Diabetic  Filed Weights   04/12/16 1100 04/12/16 1415  Weight: 95.255 kg (210 lb) 96.8 kg (213 lb 6.5 oz)    History of present illness:  Please review dictated H and P from 5/14 for details. Briefly, 60 y.o. male with medical history significant of coronary artery disease, insulin dependent diabetes mellitus who presents to the emergency department with complaints of nausea vomiting. He states that in the last 24-48 hours he has experienced increasing generalized weakness, fatigue, lethargy, malaise as well as having dry mouth and polyuria. States this morning having blood sugars in the 500 range. Patient was found to have anion gap of 18 with urinary ketones. Pt was admitted for further work up.  Hospital Course:  1. Diabetic ketoacidosis. Mr [name] [name] is a 60 year old gentleman with a past medical history of insulin dependent diabetes mellitus, who is on 70 units of Lantus with NovoLog mealtime coverage at home, presenting with complaints of nausea vomiting, polyuria, polydipsia. He was found have a blood sugar in the 500s with the presence of an anion gap metabolic acidosis.  -Patient was admitted to stepdown level of care. -Patient was continued on insulin drip and IV fluid hydration with resolution of DKA overnight. -Patient has since been resumed on his home regimen. -Patient claims compliance with his regimen. No  obvious infectious etiology. -glucose remained stable   2. Coronary artery disease. He has a history of non-ST segment elevation myocardial infarction, undergoing a cardiac catheterization in July 2015 that revealed occluded RCA status post percutaneous intervention with drug-eluting stent. He does not appear to be having acute cardiac issues at this time, initial troponin was negative, EKG did not reveal acute ischemic changes. He remains on dual agent antiplatelet therapy with Effient and aspirin which will be continued during this hospitalization.  3. Hypertension. Will continue metoprolol 12.5 mg by mouth twice a day, lisinopril 2.5 mg by mouth daily and Imdur 30 mg by mouth daily.   4. Dyslipidemia. Continue Crestor 40 mg by mouth daily  5. Posterior calf tenderness - Patient reports a two-week history of "knot" over the posterior left calf. - Obtained lower extremity Dopplers that were neg for DVT - Will have patient follow up closely with PCP  Procedures:  Lower extremity dopplers  Discharge Exam: Filed Vitals:   04/14/16 0313 04/14/16 0400 04/14/16 0800 04/14/16 1200  BP:  128/59 144/74   Pulse:   83   Temp: 97.3 F (36.3 C)  97.3 F (36.3 C) 98 F (36.7 C)  TempSrc: Oral  Oral Oral  Resp:      Height:      Weight:      SpO2:   96%     General: Awake, in nad Cardiovascular: regular, s1, s2 Respiratory: normal resp effort, no wheezing  Discharge Instructions     Medication List    TAKE these medications        ACCU-CHEK SOFTCLIX LANCETS lancets  Use as instructed to check  blood sugar 3 times per day dx code 250.02     buPROPion 300 MG 24 hr tablet  Commonly known as:  WELLBUTRIN XL  TAKE ONE TABLET BY MOUTH ONCE DAILY     EFFIENT 10 MG Tabs tablet  Generic drug:  prasugrel  TAKE ONE TABLET BY MOUTH ONCE DAILY (PATIENT  NEEDS  TO  SCHEDULE  APPOINTMENT  FOR  ADDITIONAL  REFILLS  -  2ND  ATTEMPT  02/04/16)     EQ ASPIRIN ADULT LOW DOSE 81 MG EC tablet   Generic drug:  aspirin  TAKE ONE TABLET BY MOUTH ONCE DAILY     glucose blood test strip  Commonly known as:  ACCU-CHEK AVIVA PLUS  Use as instructed to check blood sugar 3 times per day dx code E11.59     HYDROcodone-acetaminophen 5-325 MG tablet  Commonly known as:  NORCO/VICODIN  Take 1 tablet by mouth every 6 (six) hours as needed for moderate pain (pain).     Insulin Glargine 100 UNIT/ML Solostar Pen  Commonly known as:  LANTUS SOLOSTAR  INJECT 40 UNITS SUBCUTANEOUSLY IN THE MORNING AND 40 UNITS IN THE EVENING     Insulin Pen Needle 31G X 5 MM Misc  Commonly known as:  B-D UF III MINI PEN NEEDLES  Use to inject insulin 5 pen needles per day     lisinopril 2.5 MG tablet  Commonly known as:  PRINIVIL,ZESTRIL  Take 1 tablet (2.5 mg total) by mouth daily.     metoprolol tartrate 25 MG tablet  Commonly known as:  LOPRESSOR  TAKE ONE-HALF TABLET BY MOUTH TWICE DAILY     nitroGLYCERIN 0.4 MG SL tablet  Commonly known as:  NITROSTAT  Place 1 tablet (0.4 mg total) under the tongue every 5 (five) minutes as needed for chest pain (up to 3 doses).     NOVOLOG FLEXPEN 100 UNIT/ML FlexPen  Generic drug:  insulin aspart  INJECT 30 TO 35 UNITS SUBCUTANEOUSLY THREE TIMES DAILY     pantoprazole 40 MG tablet  Commonly known as:  PROTONIX  TAKE ONE TABLET BY MOUTH ONCE DAILY.     rosuvastatin 40 MG tablet  Commonly known as:  CRESTOR  Take 1 tablet (40 mg total) by mouth daily.       No Known Allergies Follow-up Information    Follow up with SHAW,EVA, MD In 1 week.   Specialty:  Family Medicine   Why:  Hospital follow up   Contact information:   [address] [place] [address] [phone]        The results of significant diagnostics from this hospitalization (including imaging, microbiology, ancillary and laboratory) are listed below for reference.    Significant Diagnostic Studies: Dg Chest 2 View  04/12/2016  CLINICAL DATA:  Weakness, vomiting for 2 days  EXAM: CHEST  2 VIEW COMPARISON:  None. FINDINGS: Cardiomediastinal silhouette is stable. No acute infiltrate or pleural effusion. No pulmonary edema. Bony thorax is unremarkable. IMPRESSION: No active cardiopulmonary disease. Electronically Signed   By: Natasha Mead M.D.   On: 04/12/2016 13:16    Microbiology: Recent Results (from the past 240 hour(s))  MRSA PCR Screening     Status: None   Collection Time: 04/12/16  2:16 PM  Result Value Ref Range Status   MRSA by PCR NEGATIVE NEGATIVE Final    Comment:        The GeneXpert MRSA Assay (FDA approved for NASAL specimens only), is one component of a comprehensive MRSA colonization  surveillance program. It is not intended to diagnose MRSA infection nor to guide or monitor treatment for MRSA infections.      Labs: Basic Metabolic Panel:  Recent Labs Lab 04/12/16 1808 04/12/16 2228 04/13/16 0209 04/13/16 0615 04/14/16 0310  NA 136 138 137 136 138  K 4.3 4.0 4.2 3.7 3.8  CL 109 111 110 110 111  CO2 20* 22 20* 20* 20*  GLUCOSE 208* 101* 195* 199* 131*  BUN 19 18 15 14 20   CREATININE 0.97 0.91 0.96 0.97 0.87  CALCIUM 8.8* 8.8* 8.5* 8.3* 8.6*   Liver Function Tests: No results for input(s): AST, ALT, ALKPHOS, BILITOT, PROT, ALBUMIN in the last 168 hours. No results for input(s): LIPASE, AMYLASE in the last 168 hours. No results for input(s): AMMONIA in the last 168 hours. CBC:  Recent Labs Lab 04/12/16 1202 04/12/16 1437 04/13/16 0209 04/14/16 0310  WBC 6.8 8.8 5.4 4.7  HGB 14.5 14.8 13.4 13.2  HCT 43.3 44.6 39.7 39.3  MCV 83.3 83.2 81.7 81.9  PLT 268 281 268 282   Cardiac Enzymes: No results for input(s): CKTOTAL, CKMB, CKMBINDEX, TROPONINI in the last 168 hours. BNP: BNP (last 3 results)  Recent Labs  05/30/15 1510  BNP 17.3    ProBNP (last 3 results) No results for input(s): PROBNP in the last 8760 hours.  CBG:  Recent Labs Lab 04/13/16 1620 04/13/16 2145 04/14/16 0212 04/14/16 0814  04/14/16 1206  GLUCAP 236* 226* 92 85 100*       Signed:  CHIU, STEPHEN K  [facility] 04/14/2016, 2:24 PM

## 2016-04-23 ENCOUNTER — Ambulatory Visit (INDEPENDENT_AMBULATORY_CARE_PROVIDER_SITE_OTHER): Payer: Commercial Managed Care - HMO | Admitting: Family Medicine

## 2016-04-23 ENCOUNTER — Telehealth: Payer: Self-pay

## 2016-04-23 ENCOUNTER — Encounter: Payer: Self-pay | Admitting: Family Medicine

## 2016-04-23 VITALS — BP 144/82 | HR 121 | Temp 99.0°F | Resp 16 | Ht 69.0 in | Wt 226.4 lb

## 2016-04-23 DIAGNOSIS — S86992A Other injury of unspecified muscle(s) and tendon(s) at lower leg level, left leg, initial encounter: Secondary | ICD-10-CM

## 2016-04-23 DIAGNOSIS — G894 Chronic pain syndrome: Secondary | ICD-10-CM | POA: Diagnosis not present

## 2016-04-23 DIAGNOSIS — Z794 Long term (current) use of insulin: Secondary | ICD-10-CM

## 2016-04-23 DIAGNOSIS — I25118 Atherosclerotic heart disease of native coronary artery with other forms of angina pectoris: Secondary | ICD-10-CM

## 2016-04-23 DIAGNOSIS — R252 Cramp and spasm: Secondary | ICD-10-CM

## 2016-04-23 DIAGNOSIS — E1165 Type 2 diabetes mellitus with hyperglycemia: Secondary | ICD-10-CM | POA: Diagnosis not present

## 2016-04-23 DIAGNOSIS — I214 Non-ST elevation (NSTEMI) myocardial infarction: Secondary | ICD-10-CM | POA: Diagnosis not present

## 2016-04-23 DIAGNOSIS — N50812 Left testicular pain: Secondary | ICD-10-CM | POA: Diagnosis not present

## 2016-04-23 DIAGNOSIS — F329 Major depressive disorder, single episode, unspecified: Secondary | ICD-10-CM | POA: Diagnosis not present

## 2016-04-23 DIAGNOSIS — F32A Depression, unspecified: Secondary | ICD-10-CM

## 2016-04-23 DIAGNOSIS — E559 Vitamin D deficiency, unspecified: Secondary | ICD-10-CM

## 2016-04-23 LAB — COMPREHENSIVE METABOLIC PANEL
ALT: 11 U/L (ref 9–46)
AST: 22 U/L (ref 10–35)
Albumin: 4.7 g/dL (ref 3.6–5.1)
Alkaline Phosphatase: 122 U/L — ABNORMAL HIGH (ref 40–115)
BUN: 9 mg/dL (ref 7–25)
CHLORIDE: 98 mmol/L (ref 98–110)
CO2: 22 mmol/L (ref 20–31)
Calcium: 10.7 mg/dL — ABNORMAL HIGH (ref 8.6–10.3)
Creat: 1.16 mg/dL (ref 0.70–1.33)
GLUCOSE: 164 mg/dL — AB (ref 65–99)
POTASSIUM: 4.2 mmol/L (ref 3.5–5.3)
Sodium: 136 mmol/L (ref 135–146)
Total Bilirubin: 0.5 mg/dL (ref 0.2–1.2)
Total Protein: 7.7 g/dL (ref 6.1–8.1)

## 2016-04-23 LAB — CBC
HCT: 46.2 % (ref 38.5–50.0)
Hemoglobin: 15.6 g/dL (ref 13.2–17.1)
MCH: 27.6 pg (ref 27.0–33.0)
MCHC: 33.8 g/dL (ref 32.0–36.0)
MCV: 81.8 fL (ref 80.0–100.0)
MPV: 9.4 fL (ref 7.5–12.5)
PLATELETS: 358 10*3/uL (ref 140–400)
RBC: 5.65 MIL/uL (ref 4.20–5.80)
RDW: 15.4 % — AB (ref 11.0–15.0)
WBC: 7.4 10*3/uL (ref 3.8–10.8)

## 2016-04-23 LAB — POCT URINALYSIS DIP (MANUAL ENTRY)
Blood, UA: NEGATIVE
LEUKOCYTES UA: NEGATIVE
NITRITE UA: NEGATIVE
Protein Ur, POC: 100 — AB
Spec Grav, UA: 1.025
Urobilinogen, UA: 0.2
pH, UA: 5.5

## 2016-04-23 LAB — POC MICROSCOPIC URINALYSIS (UMFC)

## 2016-04-23 LAB — TSH: TSH: 2.55 m[IU]/L (ref 0.40–4.50)

## 2016-04-23 MED ORDER — PRASUGREL HCL 10 MG PO TABS: ORAL_TABLET | ORAL | Status: DC

## 2016-04-23 MED ORDER — HYDROCODONE-ACETAMINOPHEN 5-325 MG PO TABS: 1.0000 | ORAL_TABLET | Freq: Four times a day (QID) | ORAL | Status: DC | PRN

## 2016-04-23 MED ORDER — LISINOPRIL 2.5 MG PO TABS: 2.5000 mg | ORAL_TABLET | Freq: Every day | ORAL | Status: DC

## 2016-04-23 NOTE — Progress Notes (Signed)
Subjective:    Patient ID: [name] [name], male    DOB: 22-May-1956, 60 y.o.   MRN: [id number] Chief Complaint  Patient presents with  . hospital follow up  . Leg Pain    left leg had [place] in hospital no DVT  . pain in groin area    x 1 mth    HPI CBGs 180 since hosp d.c - he was in DKA WITH CBGS in 500 - they restarted his home regimen. He doesn't know what shoved it up.  He noted 1 mo ago. Leg still very painful to the touch tough swelling is improving.  5/16 LLExt doppler Taking 3 classes online over the sumkmer [employer] of campus organization.  His brothe rin [place] is diagnosed with stage 4 throat cancer. He was having shooting pains in his back.  No ShoB or tachycardia. Discomfort and slight pan in groin - testicular.  MOre the at night worse with pressure. Urine and bowels - nocutria of just once/gihit More muscle spasms and cramping in his hands worse at night in hand and feet. None in calf.  UA in [place] only absorpene jr on his leg for relief  Has to get a new phone number- lost phone after he got message about scheduling with pain management - would still like to see Dr. Hermelinda Medicus since his wife sees him as well. Still going to [facility]  Past Medical History  Diagnosis Date  . Diabetes type 2, uncontrolled (HCC)   . Hypercholesterolemia   . GERD (gastroesophageal reflux disease)   . Hypertension   . Hemorrhoids     hx of  . Peripheral neuropathy (HCC)   . Depression   . CAD (coronary artery disease)     a. NSTEMI 05/2014 - occluded RCA dominant proximal s/p asp-thrombectomy/DES to RCA, minimal LAD/LCx, EF 50% by cath, 65-70% by echo.  . Barrett's esophagus   . Former tobacco use   . MI (myocardial infarction) [facility])    Past Surgical History  Procedure Laterality Date  . Tonsillectomy    . Left heart catheterization with coronary angiogram N/A 06/14/2014    Procedure: LEFT HEART CATHETERIZATION WITH CORONARY ANGIOGRAM;  Surgeon: Corky Crafts, MD;  Location: [facility] CATH  LAB;  Service: Cardiovascular;  Laterality: N/A;   Current Outpatient Prescriptions on File Prior to Visit  Medication Sig Dispense Refill  . ACCU-CHEK SOFTCLIX LANCETS lancets Use as instructed to check blood sugar 3 times per day dx code 250.02 100 each 2  . buPROPion (WELLBUTRIN XL) 300 MG 24 hr tablet TAKE ONE TABLET BY MOUTH ONCE DAILY 90 tablet 0  . EQ ASPIRIN ADULT LOW DOSE 81 MG EC tablet TAKE ONE TABLET BY MOUTH ONCE DAILY 90 tablet 0  . glucose blood (ACCU-CHEK AVIVA PLUS) test strip Use as instructed to check blood sugar 3 times per day dx code E11.59 100 each 2  . Insulin Glargine (LANTUS SOLOSTAR) 100 UNIT/ML Solostar Pen INJECT 40 UNITS SUBCUTANEOUSLY IN THE MORNING AND 40 UNITS IN THE EVENING (Patient taking differently: Inject 35 Units into the skin 2 (two) times daily. ) 8 pen 5  . Insulin Pen Needle (B-D UF III MINI PEN NEEDLES) 31G X 5 MM MISC Use to inject insulin 5 pen needles per day 150 each 2  . metoprolol tartrate (LOPRESSOR) 25 MG tablet TAKE ONE-HALF TABLET BY MOUTH TWICE DAILY 60 tablet 0  . nitroGLYCERIN (NITROSTAT) 0.4 MG SL tablet Place 1 tablet (0.4 mg total) under the tongue every 5 (  five) minutes as needed for chest pain (up to 3 doses). 25 tablet 3  . NOVOLOG FLEXPEN 100 UNIT/ML FlexPen INJECT 30 TO 35 UNITS SUBCUTANEOUSLY THREE TIMES DAILY (Patient taking differently: Inject 20 to 25 units before meals 3 times daily.) 10 pen 0  . pantoprazole (PROTONIX) 40 MG tablet TAKE ONE TABLET BY MOUTH ONCE DAILY. 30 tablet 11  . rosuvastatin (CRESTOR) 40 MG tablet Take 1 tablet (40 mg total) by mouth daily. 90 tablet 3   No current facility-administered medications on file prior to visit.   Not on File Family History  Problem Relation Age of Onset  . Hypertension Mother   . Alzheimer's disease Mother   . Alcohol abuse Father   . Cancer Father     "Throat"  . Diabetes type II Brother   . Heart disease Neg Hx    Social History   Social History  . Marital Status:  Married    Spouse Name: N/A  . Number of Children: N/A  . Years of Education: N/A   Occupational History  . unemployed     applying for disability   Social History Main Topics  . Smoking status: Former Smoker -- 0.25 packs/day for 3 years    Quit date: 11/30/1998  . Smokeless tobacco: Never Used  . Alcohol Use: No  . Drug Use: No  . Sexual Activity: Yes    Birth Control/ Protection: None   Other Topics Concern  . None   Social History Narrative    Review of Systems  Constitutional: Positive for fatigue. Negative for fever, activity change and appetite change.  Respiratory: Negative for cough, chest tightness, shortness of breath and wheezing.   Cardiovascular: Positive for leg swelling. Negative for chest pain and palpitations.  Gastrointestinal: Negative for nausea, vomiting and abdominal pain.  Musculoskeletal: Positive for myalgias, back pain and arthralgias. Negative for joint swelling and gait problem.  Neurological: Negative for weakness and numbness.  Psychiatric/Behavioral: Positive for dysphoric mood. Negative for confusion, decreased concentration and agitation.       Objective:  BP 144/82 mmHg  Pulse 121  Temp(Src) 99 F (37.2 C) (Oral)  Resp 16  Ht 5\' 9"  (1.753 m)  Wt 226 lb 6.4 oz (102.694 kg)  BMI 33.42 kg/m2  Physical Exam  Constitutional: He is oriented to person, place, and time. He appears well-developed and well-nourished. No distress.  HENT:  Head: Normocephalic and atraumatic.  Eyes: Conjunctivae are normal. Pupils are equal, round, and reactive to light. No scleral icterus.  Neck: Normal range of motion. Neck supple. No thyromegaly present.  Cardiovascular: Normal rate, regular rhythm, normal heart sounds and intact distal pulses.   Pulmonary/Chest: Effort normal and breath sounds normal. No respiratory distress.  Musculoskeletal: He exhibits no edema.       Left lower leg: He exhibits tenderness. He exhibits no bony tenderness, no swelling  and no edema.       Legs: Lymphadenopathy:    He has no cervical adenopathy.  Neurological: He is alert and oriented to person, place, and time.  Skin: Skin is warm and dry. He is not diaphoretic.  Psychiatric: His speech is normal. Thought content normal. His affect is blunt. He is slowed and withdrawn. Cognition and memory are normal. He exhibits a depressed mood.      Assessment & Plan:   1. Inj unsp musc/tend at lower leg level, left leg, init - I suspect he has partially torn his calf muscle  2. Depression - Still on  Wellbutrin from [place]   3. Uncontrolled type 2 diabetes mellitus with hyperglycemia, with long-term current use of insulin (HCC) - Lantus 35u bid  4. Coronary artery disease involving native heart with other form of angina pectoris (HCC) - refilled Effient, overdue for f/u with cards  5. NSTEMI (non-ST elevated myocardial infarction) (HCC)   6. Chronic pain syndrome - Resend to Dr. Corky Downs at [facility], pt will f/u on referral this time  7. Left testicular pain   8. Cramps, extremity   9. Hypocalcemia - chronic since 2013, has not been worked up that I am aware off so recheck and vit D.    Orders Placed This Encounter  Procedures  . GC/Chlamydia Probe Amp  . US Scrotum    04-24-16/lmom to have Dr add Doppler ([id number]) 10:29 am/aw    Standing Status: Future     Number of Occurrences:      Standing Expiration Date: 06/23/2017    Order Specific Question:  Reason for Exam (SYMPTOM  OR DIAGNOSIS REQUIRED)    Answer:  tenderness of left teste for sev wks around epydidimus    Order Specific Question:  Preferred imaging location?    Answer:  [facility]  . CBC  . PSA  . TSH  . Comprehensive metabolic panel  . VITAMIN D 25 Hydroxy (Vit-D Deficiency, Fractures)  . POCT urinalysis dipstick  . POCT Microscopic Urinalysis ([facility])    Meds ordered this encounter  Medications  . lisinopril (PRINIVIL,ZESTRIL) 2.5 MG tablet    Sig: Take 1 tablet (2.5 mg  total) by mouth daily.    Dispense:  90 tablet    Refill:  3  . prasugrel (EFFIENT) 10 MG TABS tablet    Sig: TAKE ONE TABLET BY MOUTH ONCE DAILY (PATIENT  NEEDS  TO  SCHEDULE  APPOINTMENT  WITH DR. VARANASI FOR ADDITIONAL REFILLS    Dispense:  90 tablet    Refill:  0    PLEASE CALL ASAP. YOU ARE OVERDUE FOR YOUR APOINTMENT. NO FURTHER REFILLS WILL BE SEND IN. 3RD ATTEMPT  . HYDROcodone-acetaminophen (NORCO/VICODIN) 5-325 MG tablet    Sig: Take 1 tablet by mouth every 6 (six) hours as needed for moderate pain (pain).    Dispense:  90 tablet    Refill:  0     Norberto Sorenson, MD MPH

## 2016-04-23 NOTE — Telephone Encounter (Signed)
Dr. [name] asked me to call Dr. Alessandra Bevels office and advise them that she saw patient in the office today for his calf muscle and that she wrote him an RX for pain.  I called his office and left a message for him.

## 2016-04-23 NOTE — Patient Instructions (Addendum)
IF you received an x-ray today, you will receive an invoice from [facility]. Please contact [facility] at [phone] with questions or concerns regarding your invoice.   IF you received labwork today, you will receive an invoice from [employer]. Please contact [facility] at [phone] with questions or concerns regarding your invoice.   Our billing staff will not be able to assist you with questions regarding bills from these companies.  You will be contacted with the lab results as soon as they are available. The fastest way to get your results is to activate your My Chart account. Instructions are located on the last page of this paperwork. If you have not heard from [place] regarding the results in 2 weeks, please contact this office.    Medial Head Gastrocnemius Tear With Rehab Medial head gastrocnemius tear, also called tennis leg, is a tear (strain) in a muscle or tendon of the inner portion (medial head) of one of the calf muscles (gastrocnemius). The inner portion of the calf muscle attaches to the thigh bone (femur) and is responsible for bending the knee and straightening the foot (standing "on tiptoe"). Strains are classified into three categories. Grade 1 strains cause pain, but the tendon is not lengthened. Grade 2 strains include a lengthened ligament, due to the ligament being stretched or partially ruptured. With grade 2 strains there is still function, although function may be decreased. Grade 3 strains involve a complete tear of the tendon or muscle, and function is usually impaired. SYMPTOMS   Sudden "pop" or tear felt at the time of injury.  Pain, tenderness, swelling, warmth, or redness over the middle inner calf.  Pain and weakness with ankle motion, especially flexing the ankle against resistance, as well as pain with lifting up the foot (extending the ankle).  Bruising (contusion) of the calf, heel, and sometimes the foot  within 48 hours of injury.  Muscle spasm in the calf. CAUSES  Muscle and ligament strains occur when a force is placed on the muscle or ligament that is greater than it can handle. Common causes of injury include:  Direct hit (trauma) to the calf.  Sudden forceful pushing off or landing on the foot (jumping, landing, serving a tennis ball, lunging). RISK INCREASES WITH:  Sports that require sudden, explosive calf muscle contraction, such as those involving jumping (basketball), Ollie Delano running, quick starts (running), or lunging (racquetball, tennis).  Contact sports (football, soccer, hockey).  Poor strength and flexibility.  Previous lower limb injury. PREVENTION  Warm up and stretch properly before activity.  Allow for adequate recovery between workouts.  Maintain physical fitness:  Strength, flexibility, and endurance.  Cardiovascular fitness.  Learn and use proper exercise technique.  Complete rehabilitation after lower limb injury, before returning to competition or practice. PROGNOSIS  If treated properly, tennis leg usually heals within 6 weeks of nonsurgical treatment.  RELATED COMPLICATIONS   Longer healing time, if not properly treated or if not given enough time to heal.  Recurring symptoms and injury, if activity is resumed too soon, with overuse, with a direct blow, or with poor technique.  If untreated, may progress to a complete tear (rare) or other injury, due to limping and favoring of the injured leg.  Persistent limping, due to scarring and shortening of the calf muscles, as a result of inadequate rehabilitation.  Prolonged disability. TREATMENT  Treatment first involves the use of ice and medication to help reduce pain and inflammation. The use of strengthening  and stretching exercises may help reduce pain with activity. These exercises may be performed at home or with a therapist. For severe injuries, referral to a therapist may be needed for further  evaluation and treatment. Your caregiver may advise that you wear a brace to help healing. Sometimes, crutches are needed until you can walk without limping. Rarely, surgery is needed.  MEDICATION   If pain medicine is needed, nonsteroidal anti-inflammatory medicines (aspirin and ibuprofen), or other minor pain relievers (acetaminophen), are often advised.  Do not take pain medicine for 7 days before surgery.  Prescription pain relievers may be given, if your caregiver thinks they are needed. Use only as directed and only as much as you need. HEAT AND COLD  Cold treatment (icing) should be applied for 10 to 15 minutes every 2 to 3 hours for inflammation and pain, and immediately after activity that aggravates your symptoms. Use ice packs or an ice massage.  Heat treatment may be used before performing stretching and strengthening activities prescribed by your caregiver, physical therapist, or athletic trainer. Use a heat pack or a warm water soak. SEEK MEDICAL CARE IF:   Symptoms get worse or do not improve in 2 weeks, despite treatment.  Numbness or tingling develops.  New, unexplained symptoms develop. (Drugs used in treatment may produce side effects.) EXERCISES  RANGE OF MOTION (ROM) AND STRETCHING EXERCISES - Medial Head Gastrocnemius Tear (Tennis Leg) These exercises may help you when beginning to rehabilitate your injury. Your symptoms may resolve with or without further involvement from your physician, physical therapist, or athletic trainer. While completing these exercises, remember:   Restoring tissue flexibility helps normal motion to return to the joints. This allows healthier, less painful movement and activity.  An effective stretch should be held for at least 30 seconds.  A stretch should never be painful. You should only feel a gentle lengthening or release in the stretched tissue. STRETCH - Gastrocsoleus  Sit with your right / left leg extended. Holding onto both ends  of a belt or towel, loop it around the ball of your foot.  Keeping your right / left ankle and foot relaxed and your knee straight, pull your foot and ankle toward you using the belt.  You should feel a gentle stretch behind your calf or knee. Hold this position for __________ seconds. Repeat __________ times. Complete this stretch __________ times per day.  RANGE OF MOTION - Ankle Dorsiflexion, Active Assisted   Remove your shoes and sit on a chair, preferably not on a carpeted surface.  Place your right / left foot directly under the knee. Extend your opposite leg for support.  Keeping your heel down, slide your right / left foot back toward the chair, until you feel a stretch at your ankle or calf. If you do not feel a stretch, slide your bottom forward to the edge of the chair, while still keeping your heel down.  Hold this stretch for __________ seconds. Repeat __________ times. Complete this stretch __________ times per day.  STRETCH - Gastroc, Standing   Place your hands on a wall.  Extend your right / left leg behind you, keeping the front knee somewhat bent.  Slightly point your toes inward on your back foot.  Keeping your right / left heel on the floor and your knee straight, shift your weight toward the wall, not allowing your back to arch.  You should feel a gentle stretch in the right / left calf. Hold this position for __________  seconds. Repeat __________ times. Complete this stretch __________ times per day. STRETCH - Soleus, Standing   Place your hands on a wall.  Extend your right / left leg behind you, keeping the other knee somewhat bent.  Point your toes of your back foot slightly inward.  Keep your right / left heel on the floor, bend your back knee, and slightly shift your weight over the back leg so that you feel a gentle stretch deep in your back calf.  Hold this position for __________ seconds. Repeat __________ times. Complete this stretch __________  times per day. STRETCH - Gastrocsoleus, Standing Note: This exercise can place a lot of stress on your foot and ankle. Please complete this exercise only if specifically instructed by your caregiver.   Place the ball of your right / left foot on a step, keeping your other foot firmly on the same step.  Hold on to the wall or a rail for balance.  Slowly lift your other foot, allowing your body weight to press your heel down over the edge of the step.  You should feel a stretch in your right / left calf.  Hold this position for __________ seconds.  Repeat this exercise with a slight bend in your right / left knee. Repeat __________ times. Complete this stretch __________ times per day.  STRENGTHENING EXERCISES - Medial Head Gastrocnemius Tear (Tennis Leg) These exercises may help you when beginning to rehabilitate your injury. They may resolve your symptoms with or without further involvement from your physician, physical therapist, or athletic trainer. While completing these exercises, remember:   Muscles can gain both the endurance and the strength needed for everyday activities through controlled exercises.  Complete these exercises as instructed by your physician, physical therapist, or athletic trainer. Increase the resistance and repetitions only as guided by your caregiver. STRENGTH - Plantar-flexors  Sit with your right / left leg extended. Holding onto both ends of a rubber exercise band or tubing, loop it around the ball of your foot. Keep a slight tension in the band.  Slowly push your toes away from you, pointing them downward.  Hold this position for __________ seconds. Return slowly, controlling the tension in the band. Repeat __________ times. Complete this exercise __________ times per day.  STRENGTH - Plantar-flexors  Stand with your feet shoulder width apart. Steady yourself with a wall or table, using as little support as needed.  Keeping your weight evenly spread  over the width of your feet, rise up on your toes.*  Hold this position for __________ seconds. Repeat __________ times. Complete this exercise __________ times per day.  *If this is too easy, shift your weight toward your right / left leg until you feel challenged. Ultimately, you may be asked to do this exercise while standing on your right / left foot only. STRENGTH - Plantar-flexors, Eccentric Note: This exercise can place a lot of stress on your foot and ankle. Please complete this exercise only if specifically instructed by your caregiver.   Place the balls of your feet on a step. With your hands, use only enough support from a wall or rail to keep your balance.  Keep your knees straight and rise up on your toes.  Slowly shift your weight entirely to your right / left toes and pick up your opposite foot. Gently and with controlled movement, lower your weight through your right / left foot so that your heel drops below the level of the step. You will feel a  slight stretch in the back of your right / left calf.  Use the healthy leg to help rise up onto the balls of both feet, then lower weight only onto the right / left leg again. Build up to 15 repetitions. Then progress to 3 sets of 15 repetitions.*  After completing the above exercise, complete the same exercise with a slight knee bend (about 30 degrees). Again, build up to 15 repetitions. Then progress to 3 sets of 15 repetitions.* Perform this exercise __________ times per day.  *When you easily complete 3 sets of 15, your physician, physical therapist, or athletic trainer may advise you to add resistance, by wearing a backpack filled with additional weight.   This information is not intended to replace advice given to you by your health care provider. Make sure you discuss any questions you have with your health care provider.   Document Released: 11/16/2005 Document Revised: 12/07/2014 Document Reviewed: 02/28/2009 Elsevier  Interactive Patient Education [employer]

## 2016-04-24 ENCOUNTER — Telehealth: Payer: Self-pay

## 2016-04-24 ENCOUNTER — Other Ambulatory Visit: Payer: Self-pay | Admitting: Family Medicine

## 2016-04-24 LAB — GC/CHLAMYDIA PROBE AMP
CT Probe RNA: NOT DETECTED
GC Probe RNA: NOT DETECTED

## 2016-04-24 LAB — PSA: PSA: 0.69 ng/mL (ref <=4.00)

## 2016-04-24 LAB — VITAMIN D 25 HYDROXY (VIT D DEFICIENCY, FRACTURES): VIT D 25 HYDROXY: 9 ng/mL — AB (ref 30–100)

## 2016-04-24 NOTE — Telephone Encounter (Signed)
[facility] needs another order added to coincide with the Ultrasound [place] in order to schedule an appointment.  The order needed is a venous flow doppler ([id number]).  CB# for [facility]: [phone]

## 2016-04-24 NOTE — Telephone Encounter (Signed)
Dr. [name],  Please see comments below.. Per Hospital doctor at [facility]. They also need a venous flow doppler ordered to go allow with [place] that was order.   [name] [name]

## 2016-04-25 NOTE — Telephone Encounter (Signed)
Yep, fine to order thanks.

## 2016-04-28 NOTE — Telephone Encounter (Signed)
Left message for pt to call back  °

## 2016-04-29 ENCOUNTER — Telehealth: Payer: Self-pay

## 2016-04-29 ENCOUNTER — Other Ambulatory Visit: Payer: Self-pay | Admitting: Family Medicine

## 2016-04-29 DIAGNOSIS — N5082 Scrotal pain: Secondary | ICD-10-CM

## 2016-04-29 NOTE — Telephone Encounter (Signed)
Lm Museum/gallery curator at [facility] that order has been placed

## 2016-04-29 NOTE — Telephone Encounter (Signed)
[facility]phone call from [facility] regarding patient's [place].  They are unable to contact patient because both his numbers are disconnected.  I verified the numbers with them.  Please advise.

## 2016-04-29 NOTE — Telephone Encounter (Signed)
Hmm, lets send him a letter to call us about this - he did tell me at his visit that he had lost his phone and got a new one so perhaps we didn't update it in the chart.

## 2016-04-30 NOTE — Telephone Encounter (Signed)
Letter sent.

## 2016-05-03 ENCOUNTER — Other Ambulatory Visit: Payer: Self-pay | Admitting: Family Medicine

## 2016-05-25 ENCOUNTER — Other Ambulatory Visit: Payer: Self-pay | Admitting: Family Medicine

## 2016-05-27 NOTE — Telephone Encounter (Signed)
Unable to reach patient both numbers.   Patient needs to see endocrinologist per note for further refills.

## 2016-05-28 ENCOUNTER — Telehealth: Payer: Self-pay

## 2016-05-28 DIAGNOSIS — M25512 Pain in left shoulder: Secondary | ICD-10-CM

## 2016-05-28 DIAGNOSIS — F32A Depression, unspecified: Secondary | ICD-10-CM

## 2016-05-28 DIAGNOSIS — F329 Major depressive disorder, single episode, unspecified: Secondary | ICD-10-CM

## 2016-05-28 DIAGNOSIS — M25551 Pain in right hip: Secondary | ICD-10-CM

## 2016-05-28 DIAGNOSIS — G63 Polyneuropathy in diseases classified elsewhere: Secondary | ICD-10-CM

## 2016-05-28 DIAGNOSIS — M542 Cervicalgia: Secondary | ICD-10-CM

## 2016-05-28 NOTE — Telephone Encounter (Signed)
The patient also needs a new referral for psychiatry.  He said that [name] not take [facility], so he will need a referral to another facility that accepts his insurance.  Below is the diagnosis code used on the previous referral: F32.9 (ICD-10-CM) - Depression  CB#: [phone]

## 2016-05-28 NOTE — Telephone Encounter (Signed)
The patient called about his pain management referral to [facility].  He was referred in Nov 2016.  The referral notes show that they attempted to reach him to schedule an appointment three times, but received no response, and they closed the referral in March 2017 as a refusal of service.  When the patient called their office recently, and they said that he needed to contact us regarding the referral.  Since they closed the old referral, a new referral will need to be put in the system, and he will have to start the referral process over.  Thank you.  Below are the diagnosis codes used for the previous referral:  G63 (ICD-10-CM) - Polyneuropathy associated with underlying disease (HCC)   M25.512,G89.29 (ICD-10-CM) - Chronic pain in left shoulder   M54.2,G89.29 (ICD-10-CM) - Chronic neck pain   M25.551 (ICD-10-CM) - Right hip pain

## 2016-05-28 NOTE — Telephone Encounter (Signed)
request for Lantus Solostar.  Per Dr. Alver Fisher note 6/26, pt needs Endocrinology eval prior to refills.  Unable to reach at [facility] [phone] or wife [phone].  Faxed form back to [facility] with above info on it.

## 2016-05-28 NOTE — Telephone Encounter (Signed)
Patient called in stating he needs his lantus he doesn't have the money to go see his endo states he doesn't get pay for a few more weeks and he is completely out of this med. Can someone call him back and speak to him about this. Also the number in the chart was incorrect it has been updated to reflect the correct number.

## 2016-05-28 NOTE — Telephone Encounter (Signed)
Ok done

## 2016-05-29 NOTE — Telephone Encounter (Signed)
Thank you for placing the new psychiatry referral.  If it is ok to do so, please place the pain management referral also.

## 2016-06-01 NOTE — Telephone Encounter (Signed)
Ok done

## 2016-06-17 ENCOUNTER — Telehealth: Payer: Self-pay | Admitting: Endocrinology

## 2016-06-17 NOTE — Telephone Encounter (Signed)
Please call [facility] on [address] to refill the Lantus 90 day supply

## 2016-06-17 NOTE — Telephone Encounter (Signed)
We can only give him 1 box until his visit

## 2016-06-17 NOTE — Telephone Encounter (Signed)
See below. Last appt was 11/07/2014.

## 2016-06-18 MED ORDER — INSULIN GLARGINE 100 UNIT/ML SOLOSTAR PEN: 35.0000 [IU] | PEN_INJECTOR | Freq: Two times a day (BID) | SUBCUTANEOUS | Status: DC

## 2016-06-18 NOTE — Telephone Encounter (Signed)
Rx submitted for 1 box.

## 2016-06-25 ENCOUNTER — Other Ambulatory Visit: Payer: Commercial Managed Care - HMO

## 2016-06-29 ENCOUNTER — Ambulatory Visit: Payer: Commercial Managed Care - HMO | Admitting: Endocrinology

## 2016-07-06 ENCOUNTER — Other Ambulatory Visit: Payer: Self-pay | Admitting: Endocrinology

## 2016-07-06 NOTE — Telephone Encounter (Signed)
lantus needs to be called into [facility] please on [address]

## 2016-07-14 ENCOUNTER — Other Ambulatory Visit: Payer: Self-pay | Admitting: Endocrinology

## 2016-07-14 DIAGNOSIS — E1065 Type 1 diabetes mellitus with hyperglycemia: Secondary | ICD-10-CM

## 2016-07-16 ENCOUNTER — Other Ambulatory Visit: Payer: Self-pay | Admitting: Endocrinology

## 2016-07-19 ENCOUNTER — Other Ambulatory Visit: Payer: Self-pay | Admitting: Family Medicine

## 2016-07-20 ENCOUNTER — Other Ambulatory Visit: Payer: Commercial Managed Care - HMO

## 2016-07-24 ENCOUNTER — Ambulatory Visit: Payer: Commercial Managed Care - HMO | Admitting: Endocrinology

## 2016-07-24 DIAGNOSIS — Z0289 Encounter for other administrative examinations: Secondary | ICD-10-CM

## 2016-08-18 ENCOUNTER — Encounter: Payer: Self-pay | Admitting: Physical Medicine & Rehabilitation

## 2016-08-18 ENCOUNTER — Encounter
Payer: Commercial Managed Care - HMO | Attending: Physical Medicine & Rehabilitation | Admitting: Physical Medicine & Rehabilitation

## 2016-08-18 VITALS — BP 148/90 | HR 94 | Resp 14

## 2016-08-18 DIAGNOSIS — E119 Type 2 diabetes mellitus without complications: Secondary | ICD-10-CM | POA: Diagnosis not present

## 2016-08-18 DIAGNOSIS — G894 Chronic pain syndrome: Secondary | ICD-10-CM | POA: Diagnosis not present

## 2016-08-18 DIAGNOSIS — Z5181 Encounter for therapeutic drug level monitoring: Secondary | ICD-10-CM | POA: Diagnosis not present

## 2016-08-18 DIAGNOSIS — Z79899 Other long term (current) drug therapy: Secondary | ICD-10-CM | POA: Diagnosis not present

## 2016-08-18 DIAGNOSIS — M25552 Pain in left hip: Secondary | ICD-10-CM | POA: Diagnosis present

## 2016-08-18 DIAGNOSIS — M7989 Other specified soft tissue disorders: Secondary | ICD-10-CM | POA: Insufficient documentation

## 2016-08-18 DIAGNOSIS — M25551 Pain in right hip: Secondary | ICD-10-CM | POA: Insufficient documentation

## 2016-08-18 DIAGNOSIS — M799 Soft tissue disorder, unspecified: Secondary | ICD-10-CM | POA: Diagnosis not present

## 2016-08-18 NOTE — Patient Instructions (Signed)
ONCE I HAVE CONFIRMATION THAT YOUR URINE SPECIMEN IS CONSISTENT WITH YOUR HISTORY AND PRESCRIBED MEDICATIONS, I WILL BE WILLING TO PRESCRIBE YOUR PAIN MEDICATION. THE RESULTS OF YOUR URINE TESTING COULD TAKE A WEEK OR MORE TO RETURN, HOWEVER.  IF WE DO NOT CONTACT YOU REGARDING THESE RESULTS WITHIN 10 DAYS, PLEASE CONTACT US.    PLEASE CALL ME WITH ANY PROBLEMS OR QUESTIONS ([phone])  

## 2016-08-18 NOTE — Progress Notes (Signed)
Subjective:    Patient ID: [name] [name], male    DOB: 03-11-1956, 60 y.o.   MRN: [id number]  HPI  This is an initial visit for [name] [name] who is here for an assessment of his pain. His main complaint today is bilateral hip pain. He has had the hip pain for a few years but over the last year the pain has increased substantially. He has pain with sleep and weight bearing. It has worsened also [place] [employer] [place] and needs to walk more, take stairs, etc.   He had a right hip injection in August by Dr. Maurice SmallIbazebo which provided little relief. An earlier injection in April DID provide some relief. A second hand report of a pelvic MRI from April revealed severe chondromalacia of the right acetabulum and moderate chondromalacia of the left acetabulum.   There is also history of left calf injury with "knot" formation. dopplers were negative. Was treated as calf injury/tear. The calf symptoms have improved but there is still some discomfort in the area.  For pain he takes hydrocodone. He uses heat and ice as well as some other topical ointments. Activity is modified as needed to reduce the load on his hips.   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 The hope is to ultimately work from home once he graduates. His wife is a patient of mine also.   Pain Inventory Average Pain 7 Pain Right Now 7 My pain is intermittent and sharp  In the last 24 hours, has pain interfered with the following? General activity 5 Relation with others 6 Enjoyment of life 5 What TIME of day is your pain at its worst? evening Sleep (in general) NA  Pain is worse with: walking and standing Pain improves with: heat/ice and medication Relief from Meds: 8  Mobility walk without assistance how many minutes can you walk? 10-15 ability to climb steps?  yes do you drive?  yes  Function disabled: date disabled 02/2013  Neuro/Psych weakness numbness dizziness depression  Prior Studies Any changes since last  visit?  no  Physicians involved in your care Any changes since last visit?  no Orthopedist Ibazebo and [employer] [facility]   Family History  Problem Relation Age of Onset  . Hypertension Mother   . Alzheimer's disease Mother   . Alcohol abuse Father   . Cancer Father     "Throat"  . Diabetes type II Brother   . Heart disease Neg Hx    Social History   Social History  . Marital status: Married    Spouse name: N/A  . Number of children: N/A  . Years of education: N/A   Occupational History  . unemployed     applying for disability   Social History Main Topics  . Smoking status: Former Smoker    Packs/day: 0.25    Years: 3.00    Quit date: 11/30/1998  . Smokeless tobacco: Never Used  . Alcohol use No  . Drug use: No  . Sexual activity: Yes    Birth control/ protection: None   Other Topics Concern  . Not on file   Social History Narrative  . No narrative on file   Past Surgical History:  Procedure Laterality Date  . LEFT HEART CATHETERIZATION WITH CORONARY ANGIOGRAM N/A 06/14/2014   Procedure: LEFT HEART CATHETERIZATION WITH CORONARY ANGIOGRAM;  Surgeon: Corky CraftsJayadeep S Varanasi, MD;  [facility];  Service: Cardiovascular;  Laterality: N/A;  . TONSILLECTOMY  Past Medical History:  Diagnosis Date  . Barrett's esophagus   . CAD (coronary artery disease)    a. NSTEMI 05/2014 - occluded RCA dominant proximal s/p asp-thrombectomy/DES to RCA, minimal LAD/LCx, EF 50% by cath, 65-70% by echo.  . Depression   . Diabetes type 2, uncontrolled (HCC)   . Former tobacco use   . GERD (gastroesophageal reflux disease)   . Hemorrhoids    hx of  . Hypercholesterolemia   . Hypertension   . MI (myocardial infarction) (HCC)   . Peripheral neuropathy (HCC)    BP (!) 148/90   Pulse 94   Resp 14   SpO2 97%   Opioid Risk Score:   Fall Risk Score:  `1  Depression screen PHQ 2/9  Depression screen [facility] 2/9 08/18/2016 04/23/2016 06/16/2015  Decreased Interest 0 1 0  Down,  Depressed, Hopeless 0 1 0  PHQ - 2 Score 0 2 0  Altered sleeping 3 1 -  Tired, decreased energy 2 1 -  Change in appetite 2 0 -  Feeling bad or failure about yourself  0 0 -  Trouble concentrating 1 1 -  Moving slowly or fidgety/restless 0 0 -  Suicidal thoughts 0 0 -  PHQ-9 Score 8 5 -  Difficult doing work/chores Not difficult at all - -     Review of Systems  Constitutional: Positive for diaphoresis and unexpected weight change.  Cardiovascular: Positive for leg swelling.  Endocrine:       High blood sugars  Skin: Positive for rash.  All other systems reviewed and are negative.      Objective:   Physical Exam  General: Alert and oriented x 3, No apparent distress HEENT: Head is normocephalic, atraumatic, PERRLA, EOMI, sclera anicteric, oral mucosa pink and moist, dentition intact, ext ear canals clear,  Neck: Supple without JVD or lymphadenopathy Heart: Reg rate and rhythm. No murmurs rubs or gallops Chest: CTA bilaterally without wheezes, rales, or rhonchi; no distress Abdomen: Soft, non-tender, non-distended, bowel sounds positive. Extremities: No clubbing, cyanosis, or edema. Pulses are 2+ Skin: Clean and intact without signs of breakdown Neuro: Pt is cognitively appropriate with normal insight, memory, and awareness. Cranial nerves 2-12 are intact. Sensory exam appears grossly normal. Reflexes are 2+ in all 4's. Fine motor coordination is intact. No tremors. Motor function is grossly 5/5.  Musculoskeletal: has pain to palpation of lumbar spine. ROM generally limited. Walks with antalgic pattern favoring right greater than left Lower ext. Small cyst/nodule medial left calf which remains slightly tender to touch. He has normal appearing gastrocs and ROM of calf/ankle. FABER + R>L for hip pain.  Psych: Pt's affect is appropriate. Pt is cooperative         Assessment & Plan:  1. Bilateral hip pain. 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  2.  Left medial calf cyst. History of previous inflammation. I see no signs of calf injury.  3. Type II DM    Plan: 1. Increase norco to 5/325 one q8 prn #90 once we begin writing pain medications.  2. Ice/heat to left calf, symptomatic rx 3. Ortho follow up with Delbert Harness regarding right hip replacement.  4. A UDS was collected. Will be happy to begin writing rx once UDS is confirmed consistent.  5. Discussed the use of appropriate shoewear, pacing etc.   Thirty minutes of face to face patient care time were spent during this visit. All questions were encouraged and answered. Will follow up in  one month.

## 2016-08-19 ENCOUNTER — Other Ambulatory Visit: Payer: Self-pay | Admitting: Family Medicine

## 2016-08-24 ENCOUNTER — Other Ambulatory Visit: Payer: Self-pay | Admitting: Physician Assistant

## 2016-08-24 ENCOUNTER — Telehealth: Payer: Self-pay

## 2016-08-24 ENCOUNTER — Other Ambulatory Visit: Payer: Self-pay | Admitting: Family Medicine

## 2016-08-24 ENCOUNTER — Other Ambulatory Visit: Payer: Self-pay | Admitting: Endocrinology

## 2016-08-24 NOTE — Telephone Encounter (Signed)
Patient needs his LANTUS SOLOSTAR 100 UNIT/ML Solostar Pen. States he is no longer with Dr Lucianne Muss and needs Dr. [name] to refill this for him because he is all out.

## 2016-08-25 ENCOUNTER — Ambulatory Visit: Payer: Commercial Managed Care - HMO

## 2016-08-25 ENCOUNTER — Ambulatory Visit (INDEPENDENT_AMBULATORY_CARE_PROVIDER_SITE_OTHER): Payer: Commercial Managed Care - HMO | Admitting: Family Medicine

## 2016-08-25 VITALS — BP 112/70 | HR 110 | Temp 97.9°F | Resp 17 | Ht 69.0 in | Wt 223.0 lb

## 2016-08-25 DIAGNOSIS — E1165 Type 2 diabetes mellitus with hyperglycemia: Secondary | ICD-10-CM

## 2016-08-25 DIAGNOSIS — Z794 Long term (current) use of insulin: Secondary | ICD-10-CM

## 2016-08-25 DIAGNOSIS — IMO0002 Reserved for concepts with insufficient information to code with codable children: Secondary | ICD-10-CM

## 2016-08-25 LAB — TOXASSURE SELECT,+ANTIDEPR,UR

## 2016-08-25 LAB — POCT GLYCOSYLATED HEMOGLOBIN (HGB A1C): Hemoglobin A1C: 7.7

## 2016-08-25 MED ORDER — ROSUVASTATIN CALCIUM 40 MG PO TABS: 40.0000 mg | ORAL_TABLET | Freq: Every day | ORAL | 3 refills | Status: DC

## 2016-08-25 MED ORDER — PANTOPRAZOLE SODIUM 40 MG PO TBEC: 40.0000 mg | DELAYED_RELEASE_TABLET | Freq: Every day | ORAL | 3 refills | Status: DC

## 2016-08-25 MED ORDER — INSULIN ASPART 100 UNIT/ML FLEXPEN: PEN_INJECTOR | SUBCUTANEOUS | 2 refills | Status: DC

## 2016-08-25 MED ORDER — METOPROLOL TARTRATE 25 MG PO TABS: 12.5000 mg | ORAL_TABLET | Freq: Two times a day (BID) | ORAL | 2 refills | Status: DC

## 2016-08-25 MED ORDER — LISINOPRIL 2.5 MG PO TABS: 2.5000 mg | ORAL_TABLET | Freq: Every day | ORAL | 3 refills | Status: DC

## 2016-08-25 MED ORDER — PRASUGREL HCL 10 MG PO TABS: ORAL_TABLET | ORAL | 1 refills | Status: DC

## 2016-08-25 MED ORDER — INSULIN GLARGINE 100 UNIT/ML SOLOSTAR PEN: PEN_INJECTOR | SUBCUTANEOUS | 2 refills | Status: DC

## 2016-08-25 NOTE — Patient Instructions (Addendum)
IF you received an x-ray today, you will receive an invoice from [facility]. Please contact [facility] at [phone] with questions or concerns regarding your invoice.   IF you received labwork today, you will receive an invoice from [employer]. Please contact [facility] at [phone] with questions or concerns regarding your invoice.   Our billing staff will not be able to assist you with questions regarding bills from these companies.  You will be contacted with the lab results as soon as they are available. The fastest way to get your results is to activate your My Chart account. Instructions are located on the last page of this paperwork. If you have not heard from [place] regarding the results in 2 weeks, please contact this office.    Diabetes and Standards of Medical Care Diabetes is complicated. You may find that your diabetes team includes a dietitian, nurse, diabetes educator, eye doctor, and more. To help everyone know what is going on and to help you get the care you deserve, the following schedule of care was developed to help keep you on track. Below are the tests, exams, vaccines, medicines, education, and plans you will need. HbA1c test This test shows how well you have controlled your glucose over the past 2-3 months. It is used to see if your diabetes management plan needs to be adjusted.   It is performed at least 2 times a year if you are meeting treatment goals.  It is performed 4 times a year if therapy has changed or if you are not meeting treatment goals. Blood pressure test  This test is performed at every routine medical visit. The goal is less than 140/90 mm Hg for most people, but 130/80 mm Hg in some cases. Ask your health care provider about your goal. Dental exam  Follow up with the dentist regularly. Eye exam  If you are diagnosed with type 1 diabetes as a child, get an exam upon reaching the age of 35 years  or older and having had diabetes for 3-5 years. Yearly eye exams are recommended after that initial eye exam.  If you are diagnosed with type 1 diabetes as an adult, get an exam within 5 years of diagnosis and then yearly.  If you are diagnosed with type 2 diabetes, get an exam as soon as possible after the diagnosis and then yearly. Foot care exam  Visual foot exams are performed at every routine medical visit. The exams check for cuts, injuries, or other problems with the feet.  You should have a complete foot exam performed every year. This exam includes an inspection of the structure and skin of your feet, a check of the pulses in your feet, and a check of the sensation in your feet.  Type 1 diabetes: The first exam is performed 5 years after diagnosis.  Type 2 diabetes: The first exam is performed at the time of diagnosis.  Check your feet nightly for cuts, injuries, or other problems with your feet. Tell your health care provider if anything is not healing. Kidney function test (urine microalbumin)  This test is performed once a year.  Type 1 diabetes: The first test is performed 5 years after diagnosis.  Type 2 diabetes: The first test is performed at the time of diagnosis.  A serum creatinine and estimated glomerular filtration rate (eGFR) test is done once a year to assess the level of chronic kidney disease (CKD), if present. Lipid profile (cholesterol, HDL,  HDL, LDL, triglycerides)  Performed every 5 years for most people.  The goal for LDL is less than 100 mg/dL. If you are at high risk, the goal is less than 70 mg/dL.  The goal for HDL is 40 mg/dL-50 mg/dL for men and 50 mg/dL-60 mg/dL for women. An HDL cholesterol of 60 mg/dL or higher gives some protection against heart disease.  The goal for triglycerides is less than 150 mg/dL. Immunizations  The flu (influenza) vaccine is recommended yearly for every person 6 months of age or older who has diabetes.  The  pneumonia (pneumococcal) vaccine is recommended for every person 2 years of age or older who has diabetes. Adults 65 years of age or older may receive the pneumonia vaccine as a series of two separate shots.  The hepatitis B vaccine is recommended for adults shortly after they have been diagnosed with diabetes.  The Tdap (tetanus, diphtheria, and pertussis) vaccine should be given:  According to normal childhood vaccination schedules, for children.  Every 10 years, for adults who have diabetes. Diabetes self-management education  Education is recommended at diagnosis and ongoing as needed. Treatment plan  Your treatment plan is reviewed at every medical visit.   This information is not intended to replace advice given to you by your health care provider. Make sure you discuss any questions you have with your health care provider.   Document Released: 09/13/2009 Document Revised: 12/07/2014 Document Reviewed: 04/18/2013 Elsevier Interactive Patient Education 2016 Elsevier Inc.  

## 2016-08-25 NOTE — Telephone Encounter (Signed)
This was filled by Dr Remus Blake office, and pt is also here today for OV.

## 2016-08-25 NOTE — Progress Notes (Addendum)
By signing my name below, I, Mesha Guinyard, attest that this documentation has been prepared under the direction and in the presence of Norberto Sorenson, MD.  Electronically Signed: Arvilla Market, Medical Scribe. 08/25/16. 4:24 PM.  Subjective:    Patient ID: [name] [name], male    DOB: February 24, 1956, 60 y.o.   MRN: [id number]  HPI Chief Complaint  Patient presents with  . Medication Refill    effient, insulin, protonix, nitroglycerine,     HPI Comments: [name] [name] is a 61 y.o. male with a PMHx of DM, CAD, GERD, and HLD who presents to the [facility] for medication refill. Pt is taking 18 credit hours, he's president of an association, and running a few functions. Pt's wife and 2 daughter have a stomach virus and his daughter is in her senior year. Pt has been out of his medication since 08/21/16. Pt needs a new endocrinologist referral since he's no longer with Dr. Lucianne Muss; pt couldn't get his schedule on the same accord as Dr. Remus Blake.  Pt called several times since Wednesday, 9/20, to get his medications refilled on insulin. Pt kept being told that his medication refills was ready to be reviewed but he was told back and fourth between [facility] and here that it was ready to be reviewed. Pt has been fasting.   DM: Pt has been out insulin since 08/21/16. Pt checks his blood sugar a couple of times a day and his blood sugar has been running fine. Pt reports nausea with the smell of food this morning so he hasn't ate today. Pt hasn't had any hypoglycemic episodes and denies burning or tingling pain in his feet.  Pt takes Novalog at meals totaling at TID and lantus 26 units BID. Pt mainly eats small meals in the day so he'll take smaller units. If pt ate spaghetti, he'll take the full 26 units. Pt's blood sugar was at 400 so he took 30 untits this morning. Pt's blood sugar has been raising since he hasn't been taking his medication.   Lab Results  Component Value Date   HGBA1C 8.4  09/19/2015   Lab Results  Component Value Date   MICROALBUR 0.7 09/19/2015   Vision: Pt hasn't seen his ophthalmologist in the past year.  CAD: Pt takes nitroglycerine and effient. Pt didn't know if he needed metoprolol so he hasn't been taking it regularly.  HLD: Pt states his cholesterol seemed fine so he stopped taking Crestor since it was last checked in 2016. Lab Results  Component Value Date   CHOL 249 (H) 09/19/2015   HDL 51 09/19/2015   LDLCALC 174 (H) 09/19/2015   TRIG 120 09/19/2015   CHOLHDL 4.9 09/19/2015   Lab Results  Component Value Date   ALT 11 04/23/2016   AST 22 04/23/2016   ALKPHOS 122 (H) 04/23/2016   BILITOT 0.5 04/23/2016   GERD: Pt takes protonix  Patient Active Problem List   Diagnosis Date Noted  . Chronic pain syndrome 08/18/2016  . Diabetic ketoacidosis (HCC) 04/12/2016  . Hip pain, bilateral 08/23/2015  . Chronic neck pain 08/23/2015  . DKA (diabetic ketoacidoses) (HCC) 05/30/2015  . Acute renal failure (HCC) 05/30/2015  . Restless legs 08/31/2014  . Uncontrolled diabetes mellitus type 2 with peripheral artery disease (HCC) 08/10/2014  . Old myocardial infarction 08/10/2014  . CAD (coronary artery disease) 06/18/2014  . Hyperlipidemia 06/18/2014  . NSTEMI (non-ST elevated myocardial infarction) (HCC) 06/14/2014  . Chronic pain in left shoulder 04/06/2013  .  Peripheral neuropathy (HCC) 02/28/2012  . Depression 12/25/2011  . GERD (gastroesophageal reflux disease) 12/25/2011  . DM (diabetes mellitus), type 2, uncontrolled (HCC) 11/18/2011   Past Medical History:  Diagnosis Date  . Barrett's esophagus   . CAD (coronary artery disease)    a. NSTEMI 05/2014 - occluded RCA dominant proximal s/p asp-thrombectomy/DES to RCA, minimal LAD/LCx, EF 50% by cath, 65-70% by echo.  . Depression   . Diabetes type 2, uncontrolled (HCC)   . Former tobacco use   . GERD (gastroesophageal reflux disease)   . Hemorrhoids    hx of  . Hypercholesterolemia    . Hypertension   . MI (myocardial infarction) (HCC)   . Peripheral neuropathy [facility])    Past Surgical History:  Procedure Laterality Date  . LEFT HEART CATHETERIZATION WITH CORONARY ANGIOGRAM N/A 06/14/2014   Procedure: LEFT HEART CATHETERIZATION WITH CORONARY ANGIOGRAM;  Surgeon: Corky Crafts, MD;  Location: [facility] CATH LAB;  Service: Cardiovascular;  Laterality: N/A;  . TONSILLECTOMY     No Known Allergies Prior to Admission medications   Medication Sig Start Date End Date Taking? Authorizing Provider  ACCU-CHEK AVIVA PLUS test strip USE ONE STRIP THREE TIMES DAILY 07/16/16  Yes Reather Littler, MD  ACCU-CHEK SOFTCLIX LANCETS lancets Use as instructed to check blood sugar 3 times per day dx code 250.02 07/10/14  Yes Reather Littler, MD  buPROPion (WELLBUTRIN XL) 300 MG 24 hr tablet TAKE ONE TABLET BY MOUTH ONCE DAILY 08/30/15  Yes Sherren Mocha, MD  EQ ASPIRIN ADULT LOW DOSE 81 MG EC tablet TAKE ONE TABLET BY MOUTH ONCE DAILY 07/02/15  Yes Corky Crafts, MD  HYDROcodone-acetaminophen (NORCO/VICODIN) 5-325 MG tablet Take 1 tablet by mouth every 6 (six) hours as needed for moderate pain (pain). 04/23/16  Yes Sherren Mocha, MD  Insulin Pen Needle (B-D UF III MINI PEN NEEDLES) 31G X 5 MM MISC Use to inject insulin 5 pen needles per day 07/10/14  Yes Reather Littler, MD  LANTUS SOLOSTAR 100 UNIT/ML Solostar Pen INJECT 35 UNITS SUBCUTANEOUSLY TWICE DAILY 08/25/16  Yes Reather Littler, MD  nitroGLYCERIN (NITROSTAT) 0.4 MG SL tablet Place 1 tablet (0.4 mg total) under the tongue every 5 (five) minutes as needed for chest pain (up to 3 doses). 06/18/14  Yes Dayna N Dunn, PA-C  NOVOLOG FLEXPEN 100 UNIT/ML FlexPen INJECT 30 TO 35 UNITS SUBCUTANEOUSLY THREE TIMES DAILY 05/07/16  Yes Ethelda Chick, MD  pantoprazole (PROTONIX) 40 MG tablet TAKE ONE TABLET BY MOUTH ONCE DAILY 07/21/16  Yes Chelle Jeffery, PA-C  prasugrel (EFFIENT) 10 MG TABS tablet TAKE ONE TABLET BY MOUTH ONCE DAILY (PATIENT  NEEDS  TO  SCHEDULE  APPOINTMENT  WITH  DR. VARANASI FOR ADDITIONAL REFILLS 04/23/16  Yes Sherren Mocha, MD  lisinopril (PRINIVIL,ZESTRIL) 2.5 MG tablet Take 1 tablet (2.5 mg total) by mouth daily. Patient not taking: Reported on 08/25/2016 04/23/16   Sherren Mocha, MD  metoprolol tartrate (LOPRESSOR) 25 MG tablet TAKE ONE-HALF TABLET BY MOUTH TWICE DAILY Patient not taking: Reported on 08/25/2016 07/02/15   Corky Crafts, MD  rosuvastatin (CRESTOR) 40 MG tablet Take 1 tablet (40 mg total) by mouth daily. Patient not taking: Reported on 08/25/2016 09/21/15   Sherren Mocha, MD   Social History   Social History  . Marital status: Married    Spouse name: N/A  . Number of children: N/A  . Years of education: N/A   Occupational History  . unemployed  applying for disability   Social History Main Topics  . Smoking status: Former Smoker    Packs/day: 0.25    Years: 3.00    Quit date: 11/30/1998  . Smokeless tobacco: Never Used  . Alcohol use No  . Drug use: No  . Sexual activity: Yes    Birth control/ protection: None   Other Topics Concern  . Not on file   Social History Narrative  . No narrative on file   [facility] 08/25/2016 08/18/2016 04/23/2016 06/16/2015  Decreased Interest 0 0 1 0  Down, Depressed, Hopeless 0 0 1 0  PHQ - 2 Score 0 0 2 0  Altered sleeping - 3 1 -  Tired, decreased energy - 2 1 -  Change in appetite - 2 0 -  Feeling bad or failure about yourself  - 0 0 -  Trouble concentrating - 1 1 -  Moving slowly or fidgety/restless - 0 0 -  Suicidal thoughts - 0 0 -  PHQ-9 Score - 8 5 -  Difficult doing work/chores - Not difficult at all - -   Review of Systems  Constitutional: Positive for appetite change.  Eyes: Negative for visual disturbance.  Cardiovascular: Negative for chest pain, palpitations and leg swelling.  Gastrointestinal: Positive for nausea. Negative for abdominal pain and vomiting.  Musculoskeletal: Positive for arthralgias and back pain. Negative for gait problem.    Neurological: Negative for numbness.  Psychiatric/Behavioral: Negative for confusion, decreased concentration, dysphoric mood and sleep disturbance. The patient is not nervous/anxious.    Objective:  Physical Exam  Constitutional: He appears well-developed and well-nourished. No distress.  HENT:  Head: Normocephalic and atraumatic.  Eyes: Conjunctivae are normal.  Neck: Neck supple.  Cardiovascular: Normal rate, regular rhythm and normal heart sounds.  Exam reveals no gallop and no friction rub.   No murmur heard. Pulmonary/Chest: Effort normal and breath sounds normal. No respiratory distress. He has no wheezes. He has no rales.  Neurological: He is alert.  Skin: Skin is warm and dry.  Psychiatric: He has a normal mood and affect. His behavior is normal.  Nursing note and vitals reviewed.  BP 112/70 (BP Location: Right Arm, Patient Position: Sitting, Cuff Size: Large)   Pulse (!) 110   Temp 97.9 F (36.6 C) (Oral)   Resp 17   Ht 5\' 9"  (1.753 m)   Wt 223 lb (101.2 kg)   SpO2 98%   BMI 32.93 kg/m    Results for orders placed or performed in visit on 08/25/16  POCT glycosylated hemoglobin (Hb A1C)  Result Value Ref Range   Hemoglobin A1C 7.7    Assessment & Plan:   1. Insulin dependent type 2 diabetes mellitus, uncontrolled (HCC)   Pt has been out of insulin for several days. Was seeing Dr. Lucianne MussKumar but it sounds like he may have been discharged from the practice for no-shows.  Resume prior regimen. IDDM has been labile and poorly controlled for >5-6 yrs  Orders Placed This Encounter  Procedures  . Comprehensive metabolic panel  . Microalbumin/Creatinine Ratio, Urine  . Lipid panel    Order Specific Question:   Has the patient fasted?    Answer:   Yes  . POCT glycosylated hemoglobin (Hb A1C)    Meds ordered this encounter  Medications  . prasugrel (EFFIENT) 10 MG TABS tablet    Sig: TAKE ONE TABLET BY MOUTH ONCE DAILY (PATIENT  NEEDS  TO  SCHEDULE  APPOINTMENT  WITH  DR. VARANASI  FOR ADDITIONAL REFILLS    Dispense:  90 tablet    Refill:  1  . pantoprazole (PROTONIX) 40 MG tablet    Sig: Take 1 tablet (40 mg total) by mouth daily.    Dispense:  90 tablet    Refill:  3  . lisinopril (PRINIVIL,ZESTRIL) 2.5 MG tablet    Sig: Take 1 tablet (2.5 mg total) by mouth daily.    Dispense:  90 tablet    Refill:  3  . metoprolol tartrate (LOPRESSOR) 25 MG tablet    Sig: Take 0.5 tablets (12.5 mg total) by mouth 2 (two) times daily.    Dispense:  90 tablet    Refill:  2  . insulin aspart (NOVOLOG FLEXPEN) 100 UNIT/ML FlexPen    Sig: INJECT 30 TO 35 UNITS SUBCUTANEOUSLY THREE TIMES DAILY    Dispense:  30 pen    Refill:  2    Please fill a 3 month supply at a time  . rosuvastatin (CRESTOR) 40 MG tablet    Sig: Take 1 tablet (40 mg total) by mouth daily.    Dispense:  90 tablet    Refill:  3  . Insulin Glargine (LANTUS SOLOSTAR) 100 UNIT/ML Solostar Pen    Sig: INJECT 36 UNITS SUBCUTANEOUSLY TWICE DAILY    Dispense:  25 pen    Refill:  2    Please fill 3 month supply at a time    I personally performed the services described in this documentation, which was scribed in my presence. The recorded information has been reviewed and considered, and addended by me as needed.   Norberto Sorenson, M.D.  [facility] [address] [place], [place] [address] [phone] phone [phone] fax  08/31/16 12:45 AM

## 2016-08-26 LAB — COMPREHENSIVE METABOLIC PANEL
ALBUMIN: 4.5 g/dL (ref 3.6–5.1)
ALT: 11 U/L (ref 9–46)
AST: 18 U/L (ref 10–35)
Alkaline Phosphatase: 106 U/L (ref 40–115)
BUN: 17 mg/dL (ref 7–25)
CALCIUM: 9.7 mg/dL (ref 8.6–10.3)
CHLORIDE: 103 mmol/L (ref 98–110)
CO2: 17 mmol/L — AB (ref 20–31)
Creat: 1.1 mg/dL (ref 0.70–1.33)
Glucose, Bld: 241 mg/dL — ABNORMAL HIGH (ref 65–99)
POTASSIUM: 4.4 mmol/L (ref 3.5–5.3)
SODIUM: 136 mmol/L (ref 135–146)
Total Bilirubin: 0.5 mg/dL (ref 0.2–1.2)
Total Protein: 7.1 g/dL (ref 6.1–8.1)

## 2016-08-26 LAB — MICROALBUMIN / CREATININE URINE RATIO
Creatinine, Urine: 118 mg/dL (ref 20–370)
MICROALB UR: 0.7 mg/dL
MICROALB/CREAT RATIO: 6 ug/mg{creat} (ref <30)

## 2016-08-26 LAB — LIPID PANEL
CHOL/HDL RATIO: 4.8 ratio (ref <=5.0)
Cholesterol: 228 mg/dL — ABNORMAL HIGH (ref 125–200)
HDL: 48 mg/dL (ref >=40)
LDL CALC: 158 mg/dL — AB (ref <130)
Triglycerides: 109 mg/dL (ref <150)
VLDL: 22 mg/dL (ref <30)

## 2016-08-27 ENCOUNTER — Telehealth: Payer: Self-pay | Admitting: Physical Medicine & Rehabilitation

## 2016-08-27 MED ORDER — HYDROCODONE-ACETAMINOPHEN 5-325 MG PO TABS: 1.0000 | ORAL_TABLET | Freq: Three times a day (TID) | ORAL | 0 refills | Status: DC | PRN

## 2016-08-27 NOTE — Telephone Encounter (Signed)
Patient would like to know if UDS results have come back.  Needs to get a refill on hydrocodone.  Please call patient.

## 2016-08-27 NOTE — Telephone Encounter (Signed)
Patient notified that script is available for pick up

## 2016-08-27 NOTE — Progress Notes (Signed)
Urine drug screen for this encounter is consistent for prescribed medication 

## 2016-08-27 NOTE — Telephone Encounter (Signed)
Patients UDS was consistent.[name] KitchenMarland KitchenMarland KitchenMarland Kitchenplease advise....FYI pt's next appointment is 09/15/2016

## 2016-08-27 NOTE — Telephone Encounter (Signed)
Order completed and rx signed.

## 2016-09-04 ENCOUNTER — Encounter: Payer: Self-pay | Admitting: Family Medicine

## 2016-09-04 NOTE — Telephone Encounter (Signed)
See info on pt's other email pertaining to starting new meds and wondering if these are SEs.

## 2016-09-15 ENCOUNTER — Encounter: Payer: Self-pay | Admitting: Physical Medicine & Rehabilitation

## 2016-09-15 ENCOUNTER — Encounter
Payer: Commercial Managed Care - HMO | Attending: Physical Medicine & Rehabilitation | Admitting: Physical Medicine & Rehabilitation

## 2016-09-15 VITALS — BP 129/84 | HR 74 | Resp 14

## 2016-09-15 DIAGNOSIS — Z79899 Other long term (current) drug therapy: Secondary | ICD-10-CM | POA: Insufficient documentation

## 2016-09-15 DIAGNOSIS — E119 Type 2 diabetes mellitus without complications: Secondary | ICD-10-CM | POA: Insufficient documentation

## 2016-09-15 DIAGNOSIS — G63 Polyneuropathy in diseases classified elsewhere: Secondary | ICD-10-CM | POA: Diagnosis not present

## 2016-09-15 DIAGNOSIS — M7989 Other specified soft tissue disorders: Secondary | ICD-10-CM | POA: Diagnosis not present

## 2016-09-15 DIAGNOSIS — Z5181 Encounter for therapeutic drug level monitoring: Secondary | ICD-10-CM | POA: Insufficient documentation

## 2016-09-15 DIAGNOSIS — M25551 Pain in right hip: Secondary | ICD-10-CM | POA: Diagnosis not present

## 2016-09-15 DIAGNOSIS — M799 Soft tissue disorder, unspecified: Secondary | ICD-10-CM | POA: Diagnosis not present

## 2016-09-15 DIAGNOSIS — G894 Chronic pain syndrome: Secondary | ICD-10-CM | POA: Diagnosis present

## 2016-09-15 DIAGNOSIS — M25552 Pain in left hip: Secondary | ICD-10-CM | POA: Diagnosis not present

## 2016-09-15 MED ORDER — HYDROCODONE-ACETAMINOPHEN 5-325 MG PO TABS: 1.0000 | ORAL_TABLET | Freq: Four times a day (QID) | ORAL | 0 refills | Status: DC | PRN

## 2016-09-15 NOTE — Patient Instructions (Signed)
PLEASE CALL ME WITH ANY PROBLEMS OR QUESTIONS ([phone])  

## 2016-09-15 NOTE — Progress Notes (Signed)
Subjective:    Patient ID: [name] [name], male    DOB: [birth year], 58 y.o.   MRN: [id number]  HPI   [name] [name] is here in follow up of his bilateral hip pain. He continues to struggle with pain although the hydrocodone does give him some relief---typically, he's using it 3x daily. Sometimes nights are difficult as the gap between doses is the longest. Ortho plan is still pending.    He is still finishing up school and graduates this coming Spring. He would look at surgery in the Summer of next year at the earliest  Pain Inventory Average Pain 7 Pain Right Now 6 My pain is intermittent, sharp, stabbing and aching  In the last 24 hours, has pain interfered with the following? General activity 6 Relation with others 6 Enjoyment of life 6 What TIME of day is your pain at its worst? evening Sleep (in general) NA  Pain is worse with: walking and sitting Pain improves with: rest and medication Relief from Meds: n/a  Mobility walk without assistance how many minutes can you walk? 5-10 ability to climb steps?  yes do you drive?  yes  Function disabled: date disabled 2011 Do you have any goals in this area?  yes  Neuro/Psych numbness tingling spasms depression  Prior Studies Any changes since last visit?  no  Physicians involved in your care Any changes since last visit?  no   Family History  Problem Relation Age of Onset  . Hypertension Mother   . Alzheimer's disease Mother   . Alcohol abuse Father   . Cancer Father     "Throat"  . Diabetes type II Brother   . Heart disease Neg Hx    Social History   Social History  . Marital status: Married    Spouse name: N/A  . Number of children: N/A  . Years of education: N/A   Occupational History  . unemployed     applying for disability   Social History Main Topics  . Smoking status: Former Smoker    Packs/day: 0.25    Years: 3.00    Quit date: 11/30/1998  . Smokeless tobacco: Never Used  . Alcohol use No  .  Drug use: No  . Sexual activity: Yes    Birth control/ protection: None   Other Topics Concern  . None   Social History Narrative  . None   Past Surgical History:  Procedure Laterality Date  . LEFT HEART CATHETERIZATION WITH CORONARY ANGIOGRAM N/A 06/14/2014   Procedure: LEFT HEART CATHETERIZATION WITH CORONARY ANGIOGRAM;  Surgeon: Corky CraftsJayadeep S Varanasi, MD;  [facility];  Service: Cardiovascular;  Laterality: N/A;  . TONSILLECTOMY     Past Medical History:  Diagnosis Date  . Barrett's esophagus   . CAD (coronary artery disease)    a. NSTEMI 05/2014 - occluded RCA dominant proximal s/p asp-thrombectomy/DES to RCA, minimal LAD/LCx, EF 50% by cath, 65-70% by echo.  . Depression   . Diabetes type 2, uncontrolled (HCC)   . Former tobacco use   . GERD (gastroesophageal reflux disease)   . Hemorrhoids    hx of  . Hypercholesterolemia   . Hypertension   . MI (myocardial infarction)   . Peripheral neuropathy (HCC)    BP 129/84   Pulse 74   Resp 14   SpO2 98%   Opioid Risk Score:   Fall Risk Score:  `1  [facility]  [facility] 09/15/2016 08/25/2016 08/18/2016 04/23/2016 06/16/2015  Decreased Interest 0 0 0 1 0  Down, Depressed, Hopeless 0 0 0 1 0  PHQ - 2 Score 0 0 0 2 0  Altered sleeping - - 3 1 -  Tired, decreased energy - - 2 1 -  Change in appetite - - 2 0 -  Feeling bad or failure about yourself  - - 0 0 -  Trouble concentrating - - 1 1 -  Moving slowly or fidgety/restless - - 0 0 -  Suicidal thoughts - - 0 0 -  PHQ-9 Score - - 8 5 -  Difficult doing work/chores - - Not difficult at all - -    Review of Systems  Constitutional: Positive for diaphoresis and unexpected weight change.  Endocrine:       High blood sugars  Skin: Positive for rash.  All other systems reviewed and are negative.      Objective:   Physical Exam  General: Alert and oriented x 3, No apparent distress HEENT: Head is normocephalic, atraumatic,  PERRLA, EOMI, sclera anicteric, oral mucosa pink and moist, dentition intact, ext ear canals clear,  Neck: Supple without JVD or lymphadenopathy Heart: Reg rate and rhythm. No murmurs rubs or gallops Chest: CTA bilaterally without wheezes, rales, or rhonchi; no distress Abdomen: Soft, non-tender, non-distended, bowel sounds positive. Extremities: No clubbing, cyanosis, or edema. Pulses are 2+ Skin: Clean and intact without signs of breakdown Neuro: Pt is cognitively appropriate with normal insight, memory, and awareness. Cranial nerves 2-12 are intact. Sensory exam appears grossly normal. Reflexes are 2+ in all 4's. Fine motor coordination is intact. No tremors. Motor function is grossly 5/5.  Musculoskeletal: has pain to palpation of lumbar spine. ROM generally limited. Walks with antalgic pattern favoring right and left Lower ext. Small cyst/nodule medial left calf which remains slightly tender to touch. He has normal appearing gastrocs and ROM of calf/ankle. FABER + R>L for hip pain.  Psych: Pt's affect is appropriate. Pt is cooperative         Assessment & Plan:  1. Bilateral hip pain. 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  2. Left medial calf cyst. History of previous inflammation. I see no signs of calf injury.  3. Type II DM    Plan: 1. Increase norco to 5/325 one q6 prn #120. Will continue to his for the short term depending upon the surgical plan. 2. Ice/heat to left calf, symptomatic rx 3. [name] regarding right hip replacement.  4. Diet/exercise were discussed 5. Discussed the use of appropriate shoewear, pacing etc.   15 of face to face patient care time were spent during this visit. All questions were encouraged and answered. Will follow up in one month.

## 2016-10-13 ENCOUNTER — Encounter: Payer: Commercial Managed Care - HMO | Attending: Physical Medicine & Rehabilitation | Admitting: Registered Nurse

## 2016-10-13 ENCOUNTER — Encounter: Payer: Self-pay | Admitting: Registered Nurse

## 2016-10-13 VITALS — BP 121/83 | HR 79 | Resp 14

## 2016-10-13 DIAGNOSIS — G63 Polyneuropathy in diseases classified elsewhere: Secondary | ICD-10-CM

## 2016-10-13 DIAGNOSIS — Z79899 Other long term (current) drug therapy: Secondary | ICD-10-CM | POA: Diagnosis not present

## 2016-10-13 DIAGNOSIS — Z5181 Encounter for therapeutic drug level monitoring: Secondary | ICD-10-CM

## 2016-10-13 DIAGNOSIS — M7989 Other specified soft tissue disorders: Secondary | ICD-10-CM | POA: Insufficient documentation

## 2016-10-13 DIAGNOSIS — E119 Type 2 diabetes mellitus without complications: Secondary | ICD-10-CM | POA: Insufficient documentation

## 2016-10-13 DIAGNOSIS — G894 Chronic pain syndrome: Secondary | ICD-10-CM | POA: Insufficient documentation

## 2016-10-13 DIAGNOSIS — M25551 Pain in right hip: Secondary | ICD-10-CM | POA: Diagnosis not present

## 2016-10-13 DIAGNOSIS — M799 Soft tissue disorder, unspecified: Secondary | ICD-10-CM | POA: Insufficient documentation

## 2016-10-13 DIAGNOSIS — M25552 Pain in left hip: Secondary | ICD-10-CM | POA: Diagnosis present

## 2016-10-13 MED ORDER — GABAPENTIN 100 MG PO CAPS: 100.0000 mg | ORAL_CAPSULE | Freq: Three times a day (TID) | ORAL | 0 refills | Status: DC

## 2016-10-13 MED ORDER — HYDROCODONE-ACETAMINOPHEN 5-325 MG PO TABS: 1.0000 | ORAL_TABLET | Freq: Four times a day (QID) | ORAL | 0 refills | Status: DC | PRN

## 2016-10-13 NOTE — Progress Notes (Signed)
Subjective:    Patient ID: [name] [name], male    DOB: 1956-06-26, 60 y.o.   MRN: [id number]  HPI: [name] [name] is a 60 year old male who returns for follow up appointment and medication refill. He states his pain is located in his right arm and bilateral hips R > L. [name] [name] states he had noticed bruising on his right arm denies injury then fine bumps ( nodules noted) develolped under his skin which are painful. No bruising noted today tenderness with palpation and he states his PCP is following.  Also states he has tingling in his left hand and bilateral feet, will prescribe gabapentin. Has been instructed to call office in a week to evaluate medication regime he verbalizes understanding.  His current exercise regime is walking.   Pain Inventory Average Pain 6 Pain Right Now 7 My pain is constant, stabbing and aching  In the last 24 hours, has pain interfered with the following? General activity 6 Relation with others 4 Enjoyment of life 4 What TIME of day is your pain at its worst? evening Sleep (in general) Poor  Pain is worse with: walking Pain improves with: heat/ice, medication and injections Relief from Meds: 8  Mobility walk without assistance how many minutes can you walk? 10-15 ability to climb steps?  yes do you drive?  yes transfers alone  Function not employed: date last employed 09/09/2010 disabled: date disabled 02/28/2013 I need assistance with the following:  household duties  Neuro/Psych weakness numbness tingling spasms depression anxiety  Prior Studies Any changes since last visit?  yes  Physicians involved in your care Any changes since last visit?  no   Family History  Problem Relation Age of Onset  . Hypertension Mother   . Alzheimer's disease Mother   . Alcohol abuse Father   . Cancer Father     "Throat"  . Diabetes type II Brother   . Heart disease Neg Hx    Social History   Social History  . Marital status: Married   Spouse name: N/A  . Number of children: N/A  . Years of education: N/A   Occupational History  . unemployed     applying for disability   Social History Main Topics  . Smoking status: Former Smoker    Packs/day: 0.25    Years: 3.00    Quit date: 11/30/1998  . Smokeless tobacco: Never Used  . Alcohol use No  . Drug use: No  . Sexual activity: Yes    Birth control/ protection: None   Other Topics Concern  . None   Social History Narrative  . None   Past Surgical History:  Procedure Laterality Date  . LEFT HEART CATHETERIZATION WITH CORONARY ANGIOGRAM N/A 06/14/2014   Procedure: LEFT HEART CATHETERIZATION WITH CORONARY ANGIOGRAM;  Surgeon: Corky CraftsJayadeep S Varanasi, MD;  [facility];  Service: Cardiovascular;  Laterality: N/A;  . TONSILLECTOMY     Past Medical History:  Diagnosis Date  . Barrett's esophagus   . CAD (coronary artery disease)    a. NSTEMI 05/2014 - occluded RCA dominant proximal s/p asp-thrombectomy/DES to RCA, minimal LAD/LCx, EF 50% by cath, 65-70% by echo.  . Depression   . Diabetes type 2, uncontrolled (HCC)   . Former tobacco use   . GERD (gastroesophageal reflux disease)   . Hemorrhoids    hx of  . Hypercholesterolemia   . Hypertension   . MI (myocardial infarction)   . Peripheral neuropathy (HCC)  BP 121/83   Pulse 79   Resp 14   SpO2 98%   Opioid Risk Score:   Fall Risk Score:  `1  Depression screen PHQ 2/9  Depression screen [facility] 2/9 09/15/2016 08/25/2016 08/18/2016 04/23/2016 06/16/2015  Decreased Interest 0 0 0 1 0  Down, Depressed, Hopeless 0 0 0 1 0  PHQ - 2 Score 0 0 0 2 0  Altered sleeping - - 3 1 -  Tired, decreased energy - - 2 1 -  Change in appetite - - 2 0 -  Feeling bad or failure about yourself  - - 0 0 -  Trouble concentrating - - 1 1 -  Moving slowly or fidgety/restless - - 0 0 -  Suicidal thoughts - - 0 0 -  PHQ-9 Score - - 8 5 -  Difficult doing work/chores - - Not difficult at all - -     Review of Systems    Constitutional: Positive for unexpected weight change.       Night sweats   HENT: Negative.   Eyes: Negative.   Respiratory: Negative.   Cardiovascular: Negative.   Gastrointestinal: Negative.   Endocrine:       High blood sugar  Genitourinary: Negative.   Musculoskeletal:       Limb swelling  Skin: Positive for rash.  Allergic/Immunologic: Negative.   Neurological: Negative.   Hematological: Negative.   Psychiatric/Behavioral: Negative.   All other systems reviewed and are negative.      Objective:   Physical Exam  Constitutional: He is oriented to person, place, and time. He appears well-developed and well-nourished.  HENT:  Head: Normocephalic and atraumatic.  Neck: Normal range of motion. Neck supple.  Cardiovascular: Normal rate and regular rhythm.   Pulmonary/Chest: Effort normal and breath sounds normal.  Musculoskeletal:  Normal Muscle Bulk and Muscle Testing Reveals: Upper Extremities: Full ROM and Muscle Strength 5/5 Bilateral Greater Trochanteric Tenderness Lower Extremities: Full ROM and Muscle Strength 5/5 Right Lower Extremity Flexion Produces Pain into Right Hip Arises from Table slowly Narrow Based Gait   Neurological: He is alert and oriented to person, place, and time.  Skin: Skin is warm and dry.  Psychiatric: He has a normal mood and affect.  Nursing note and vitals reviewed.         Assessment & Plan:  1. Bilateral hip pain. 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 Ortho Following: Dr. Thurston Hole regarding right hip placement Refilled: Hydrocodone 5/325 mg  One tablet every 6 hours as needed #120. We will continue the opioid monitoring program, this consists of regular clinic visits, examinations, urine drug screen, pill counts as well as use of [place] Controlled Substance Reporting System.  3. Type II DM: Polyneuropathy: RX: Gabapentin: Instructions given  25 of face to face patient care time was spent  during this visit. All questions were encouraged and answered.  F/U in 1 month

## 2016-10-13 NOTE — Patient Instructions (Signed)
Take Gabapentin 100 mg capsule at bed time for one week and call next week for evaluation.  [phone]

## 2016-10-15 ENCOUNTER — Encounter: Payer: Commercial Managed Care - HMO | Admitting: Registered Nurse

## 2016-11-10 ENCOUNTER — Other Ambulatory Visit: Payer: Self-pay | Admitting: Interventional Cardiology

## 2016-11-10 ENCOUNTER — Encounter: Payer: Self-pay | Admitting: Physical Medicine & Rehabilitation

## 2016-11-10 ENCOUNTER — Encounter
Payer: Commercial Managed Care - HMO | Attending: Physical Medicine & Rehabilitation | Admitting: Physical Medicine & Rehabilitation

## 2016-11-10 DIAGNOSIS — M25551 Pain in right hip: Secondary | ICD-10-CM | POA: Diagnosis present

## 2016-11-10 DIAGNOSIS — Z79899 Other long term (current) drug therapy: Secondary | ICD-10-CM | POA: Insufficient documentation

## 2016-11-10 DIAGNOSIS — E119 Type 2 diabetes mellitus without complications: Secondary | ICD-10-CM | POA: Insufficient documentation

## 2016-11-10 DIAGNOSIS — M7989 Other specified soft tissue disorders: Secondary | ICD-10-CM | POA: Insufficient documentation

## 2016-11-10 DIAGNOSIS — M799 Soft tissue disorder, unspecified: Secondary | ICD-10-CM | POA: Insufficient documentation

## 2016-11-10 DIAGNOSIS — G63 Polyneuropathy in diseases classified elsewhere: Secondary | ICD-10-CM

## 2016-11-10 DIAGNOSIS — G894 Chronic pain syndrome: Secondary | ICD-10-CM | POA: Insufficient documentation

## 2016-11-10 DIAGNOSIS — Z5181 Encounter for therapeutic drug level monitoring: Secondary | ICD-10-CM | POA: Diagnosis not present

## 2016-11-10 DIAGNOSIS — M25552 Pain in left hip: Secondary | ICD-10-CM | POA: Diagnosis not present

## 2016-11-10 MED ORDER — HYDROCODONE-ACETAMINOPHEN 5-325 MG PO TABS: 1.0000 | ORAL_TABLET | Freq: Four times a day (QID) | ORAL | 0 refills | Status: DC | PRN

## 2016-11-10 MED ORDER — NITROGLYCERIN 0.4 MG SL SUBL: 0.4000 mg | SUBLINGUAL_TABLET | SUBLINGUAL | 0 refills | Status: AC | PRN

## 2016-11-10 NOTE — Progress Notes (Signed)
Subjective:    Patient ID: [name] [name], male    DOB: May 07, 1956, 60 y.o.   MRN: [id number]  HPI   [name] is here in follow up of his chronic pain. He is happy that school has let out for the semester. It affords him some time to relax.   He states his hips continue to be issues. They are more tender when he's up on them longer.    He has also noted some pain in his left forearm with associated "knots". He doesn't remember how he might have done it. I asked him about how he carries his back pack and he states he carries it with his left arm.  His sugars have been under better control. He denies any other active medical issues.     Pain Inventory Average Pain 6 Pain Right Now 7 My pain is sharp and "throbbing"  In the last 24 hours, has pain interfered with the following? General activity 6 Relation with others 5 Enjoyment of life 5 What TIME of day is your pain at its worst? morning, evening Sleep (in general) Fair  Pain is worse with: sitting and some activites Pain improves with: heat/ice, pacing activities and medication Relief from Meds: 7  Mobility walk without assistance how many minutes can you walk? 10 ability to climb steps?  yes do you drive?  yes Do you have any goals in this area?  no  Function disabled: date disabled 02/2013  Neuro/Psych weakness numbness dizziness depression  Prior Studies Any changes since last visit?  no  Physicians involved in your care Any changes since last visit?  no   Family History  Problem Relation Age of Onset  . Hypertension Mother   . Alzheimer's disease Mother   . Alcohol abuse Father   . Cancer Father     "Throat"  . Diabetes type II Brother   . Heart disease Neg Hx    Social History   Social History  . Marital status: Married    Spouse name: N/A  . Number of children: N/A  . Years of education: N/A   Occupational History  . unemployed     applying for disability   Social History Main Topics    . Smoking status: Former Smoker    Packs/day: 0.25    Years: 3.00    Quit date: 11/30/1998  . Smokeless tobacco: Never Used  . Alcohol use No  . Drug use: No  . Sexual activity: Yes    Birth control/ protection: None   Other Topics Concern  . None   Social History Narrative  . None   Past Surgical History:  Procedure Laterality Date  . LEFT HEART CATHETERIZATION WITH CORONARY ANGIOGRAM N/A 06/14/2014   Procedure: LEFT HEART CATHETERIZATION WITH CORONARY ANGIOGRAM;  Surgeon: Corky Crafts, MD;  Location: [facility] CATH LAB;  Service: Cardiovascular;  Laterality: N/A;  . TONSILLECTOMY     Past Medical History:  Diagnosis Date  . Barrett's esophagus   . CAD (coronary artery disease)    a. NSTEMI 05/2014 - occluded RCA dominant proximal s/p asp-thrombectomy/DES to RCA, minimal LAD/LCx, EF 50% by cath, 65-70% by echo.  . Depression   . Diabetes type 2, uncontrolled (HCC)   . Former tobacco use   . GERD (gastroesophageal reflux disease)   . Hemorrhoids    hx of  . Hypercholesterolemia   . Hypertension   . MI (myocardial infarction)   . Peripheral neuropathy (HCC)    BP 120/83  Pulse 84   Resp 14   SpO2 100%   Opioid Risk Score:   Fall Risk Score:  `1  Depression screen PHQ 2/9  [facility] 2/9 09/15/2016 08/25/2016 08/18/2016 04/23/2016 06/16/2015  Decreased Interest 0 0 0 1 0  Down, Depressed, Hopeless 0 0 0 1 0  PHQ - 2 Score 0 0 0 2 0  Altered sleeping - - 3 1 -  Tired, decreased energy - - 2 1 -  Change in appetite - - 2 0 -  Feeling bad or failure about yourself  - - 0 0 -  Trouble concentrating - - 1 1 -  Moving slowly or fidgety/restless - - 0 0 -  Suicidal thoughts - - 0 0 -  PHQ-9 Score - - 8 5 -  Difficult doing work/chores - - Not difficult at all - -    Review of Systems  Constitutional:       Night sweats  HENT: Negative.   Eyes: Negative.   Respiratory: Negative.   Cardiovascular: Negative.   Gastrointestinal: Negative.   Endocrine:  Negative.   Genitourinary: Negative.   Musculoskeletal: Negative.   Skin: Positive for rash.  Allergic/Immunologic: Negative.   Neurological: Negative.   Hematological: Negative.   Psychiatric/Behavioral: Negative.   All other systems reviewed and are negative.      Objective:   Physical Exam  General: Alert and oriented x 3, No apparent distress HEENT:Head is normocephalic, atraumatic, PERRLA, EOMI, sclera anicteric, oral mucosa pink and moist, dentition intact, ext ear canals clear,  Neck:Supple without JVD or lymphadenopathy Heart:RRR Chest:CTA Abdomen:Soft, non-tender, non-distended, bowel sounds positive. Extremities:No clubbing, cyanosis, or edema. Pulses are 2+ Skin:Clean and intact without signs of breakdown Neuro:Pt is cognitively appropriate with normal insight, memory, and awareness. Cranial nerves 2-12 are intact. Sensory exam appears grosslynormal. Reflexes are 2+ in all 4's. Fine motor coordination is intact. No tremors. Motor function is grossly 5/5.  Musculoskeletal:has mild pain to palpation of lumbar spine.   Walks with antalgic pattern favoring right and left Lower ext. Small cyst/nodule medial left calf which remains slightly tender to touch. He has normal appearing gastrocs and ROM of calf/ankle. FABER + R>L for hip pain. Has mild tenderness along extensor muscles of wrist (lateral)--worsens passive extension of the wrist today Psych:Pt's affect is appropriate. Pt is cooperative and in good spirits       Assessment & Plan:  1. Bilateral hip pain. 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  2. Left medial calf cyst. History of previous inflammation. I see no signs of calf injury.   -similar problem left forearm--I see only muscle tenderness, muscle strain, trigger points likely related to his bag carrying technique 3. Type II DM    Plan: 1. Increase norco to 5/325 one q6 prn #120. Continue with opioid monitoring  program as we are. He has been compliant  2. Ice/heat to left calf and left forearm. Can use neoprene sleeve as wel. Discussed activity modifications which may help.  3. Ortho plan per Delbert HarnessMurphy Wainer regarding right hip replacement--He will potentially pursue hip replacement in 2018 when he has an opportunity for a break from his classes. He hopes to finish his courses/graduate by the end of the Fall 2018. 4. Diet/exercise were discussed 5. reiterated the use of appropriate shoewear, pacing etc.   15 of face to face patient care time were spent during this visit. All questions were encouraged and answered. Will follow up in one month.

## 2016-11-10 NOTE — Patient Instructions (Signed)
PLEASE CALL ME WITH ANY PROBLEMS OR QUESTIONS ([phone])   HAPPY HOLIDAYS!!!!                    *                * *             *   *   *         *  *   *  *  *     *  *  *  *  *  *  * *  *  *  *  *  *  *  *  *  * *               *  *               *  *               *  *  

## 2016-12-14 ENCOUNTER — Encounter: Payer: Medicare HMO | Attending: Physical Medicine & Rehabilitation | Admitting: Registered Nurse

## 2016-12-14 ENCOUNTER — Encounter: Payer: Self-pay | Admitting: Registered Nurse

## 2016-12-14 VITALS — BP 139/86 | HR 90

## 2016-12-14 DIAGNOSIS — G63 Polyneuropathy in diseases classified elsewhere: Secondary | ICD-10-CM | POA: Diagnosis not present

## 2016-12-14 DIAGNOSIS — Z79899 Other long term (current) drug therapy: Secondary | ICD-10-CM

## 2016-12-14 DIAGNOSIS — E119 Type 2 diabetes mellitus without complications: Secondary | ICD-10-CM | POA: Diagnosis not present

## 2016-12-14 DIAGNOSIS — Z5181 Encounter for therapeutic drug level monitoring: Secondary | ICD-10-CM

## 2016-12-14 DIAGNOSIS — M25552 Pain in left hip: Secondary | ICD-10-CM | POA: Diagnosis not present

## 2016-12-14 DIAGNOSIS — M25551 Pain in right hip: Secondary | ICD-10-CM

## 2016-12-14 DIAGNOSIS — M7989 Other specified soft tissue disorders: Secondary | ICD-10-CM | POA: Diagnosis not present

## 2016-12-14 DIAGNOSIS — G894 Chronic pain syndrome: Secondary | ICD-10-CM

## 2016-12-14 DIAGNOSIS — M799 Soft tissue disorder, unspecified: Secondary | ICD-10-CM | POA: Insufficient documentation

## 2016-12-14 MED ORDER — GABAPENTIN 100 MG PO CAPS: ORAL_CAPSULE | ORAL | 3 refills | Status: DC

## 2016-12-14 MED ORDER — HYDROCODONE-ACETAMINOPHEN 5-325 MG PO TABS: 1.0000 | ORAL_TABLET | Freq: Four times a day (QID) | ORAL | 0 refills | Status: DC | PRN

## 2016-12-14 NOTE — Progress Notes (Signed)
Subjective:    Patient ID: [name] [name], male    DOB: 1956/08/31, 61 y.o.   MRN: [id number]  HPI: [name] is a 61 year old male who returns for follow up appointment and medication refill. He states his pain is located in his neck  and bilateral hips R > L.  Also states the gabapentin has helped his tingling in his bilateral feet, still with burning and tingling in his feet we will increase gabapentin to two capsules at HS. He will increase on non-school days to make sure he doesn't have daytime drowsiness he verbalizes understanding. Has been instructed to call office in a week to evaluate medication regime he verbalizes understanding.  His current exercise regime is walking.    Pain Inventory Average Pain 6 Pain Right Now 4 My pain is sharp, stabbing and aching  In the last 24 hours, has pain interfered with the following? General activity 4 Relation with others 4 Enjoyment of life 4 What TIME of day is your pain at its worst? evening Sleep (in general) Poor  Pain is worse with: walking, sitting and inactivity Pain improves with: rest, heat/ice, medication and injections Relief from Meds: 8  Mobility walk without assistance how many minutes can you walk? 5 ability to climb steps?  yes do you drive?  yes Do you have any goals in this area?  yes  Function disabled: date disabled . Do you have any goals in this area?  no  Neuro/Psych weakness numbness tingling spasms  Prior Studies Any changes since last visit?  no  Physicians involved in your care Any changes since last visit?  no   Family History  Problem Relation Age of Onset  . Hypertension Mother   . Alzheimer's disease Mother   . Alcohol abuse Father   . Cancer Father     "Throat"  . Diabetes type II Brother   . Heart disease Neg Hx    Social History   Social History  . Marital status: Married    Spouse name: N/A  . Number of children: N/A  . Years of education: N/A   Occupational  History  . unemployed     applying for disability   Social History Main Topics  . Smoking status: Former Smoker    Packs/day: 0.25    Years: 3.00    Quit date: 11/30/1998  . Smokeless tobacco: Never Used  . Alcohol use No  . Drug use: No  . Sexual activity: Yes    Birth control/ protection: None   Other Topics Concern  . None   Social History Narrative  . None   Past Surgical History:  Procedure Laterality Date  . LEFT HEART CATHETERIZATION WITH CORONARY ANGIOGRAM N/A 06/14/2014   Procedure: LEFT HEART CATHETERIZATION WITH CORONARY ANGIOGRAM;  Surgeon: Corky Crafts, MD;  Location: [facility] CATH LAB;  Service: Cardiovascular;  Laterality: N/A;  . TONSILLECTOMY     Past Medical History:  Diagnosis Date  . Barrett's esophagus   . CAD (coronary artery disease)    a. NSTEMI 05/2014 - occluded RCA dominant proximal s/p asp-thrombectomy/DES to RCA, minimal LAD/LCx, EF 50% by cath, 65-70% by echo.  . Depression   . Diabetes type 2, uncontrolled (HCC)   . Former tobacco use   . GERD (gastroesophageal reflux disease)   . Hemorrhoids    hx of  . Hypercholesterolemia   . Hypertension   . MI (myocardial infarction)   . Peripheral neuropathy (HCC)  BP 139/86   Pulse 90   SpO2 97%   Opioid Risk Score:   Fall Risk Score:  `1  Depression screen PHQ 2/9  Depression screen [facility] 2/9 09/15/2016 08/25/2016 08/18/2016 04/23/2016 06/16/2015  Decreased Interest 0 0 0 1 0  Down, Depressed, Hopeless 0 0 0 1 0  PHQ - 2 Score 0 0 0 2 0  Altered sleeping - - 3 1 -  Tired, decreased energy - - 2 1 -  Change in appetite - - 2 0 -  Feeling bad or failure about yourself  - - 0 0 -  Trouble concentrating - - 1 1 -  Moving slowly or fidgety/restless - - 0 0 -  Suicidal thoughts - - 0 0 -  PHQ-9 Score - - 8 5 -  Difficult doing work/chores - - Not difficult at all - -    Review of Systems  Constitutional: Positive for diaphoresis and unexpected weight change.  HENT: Negative.     Respiratory: Negative.   Cardiovascular: Negative.   Endocrine:       High blood sugar  Musculoskeletal: Positive for arthralgias, neck pain and neck stiffness.       Spasms  Skin: Negative.   Allergic/Immunologic: Negative.   Neurological: Positive for weakness and numbness.       Tingling  Hematological: Negative.   Psychiatric/Behavioral: Negative.        Objective:   Physical Exam  Constitutional: He is oriented to person, place, and time. He appears well-developed and well-nourished.  HENT:  Head: Normocephalic and atraumatic.  Neck: Normal range of motion. Neck supple.  Musculoskeletal:  Normal Muscle Bulk and Muscle Testing Reveals: Upper Extremities: Full ROM and Muscle Strength 5/5 Left: Greater Trochanteric Tenderness Lower Extremities: Full ROM and Muscle Strength 5/5 Left Lower Extremity Flexion Produces Pain into Left Hip and Left Lower Extremity Arises from Table with ease Narrow Based Gait  Neurological: He is alert and oriented to person, place, and time.  Skin: Skin is warm and dry.  Psychiatric: He has a normal mood and affect.  Nursing note and vitals reviewed.         Assessment & Plan:  1. Bilateral hip pain. 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 Ortho Following: Dr. Thurston Hole regarding right hip placement Refilled: Hydrocodone 5/325 mg  One tablet every 6 hours as needed #120. We will continue the opioid monitoring program, this consists of regular clinic visits, examinations, urine drug screen, pill counts as well as use of [place] Controlled Substance Reporting System.  3. Type II DM: Polyneuropathy: Increase: Gabapentin: Instructions given  20 minutesof face to face patient care time was spent during this visit. All questions were encouraged and answered.  F/U in 1 month

## 2016-12-14 NOTE — Patient Instructions (Addendum)
Increase Gabapentin: One Capsule in the Morning and Afternoon. Take Two Capsules At Bedtime.   Call Office On Monday 12/21/2016 to evaluate   [phone]

## 2016-12-19 LAB — TOXASSURE SELECT,+ANTIDEPR,UR

## 2016-12-22 NOTE — Progress Notes (Signed)
Urine drug screen for this encounter is consistent for prescribed medication 

## 2017-01-02 ENCOUNTER — Other Ambulatory Visit: Payer: Self-pay | Admitting: Family Medicine

## 2017-01-11 ENCOUNTER — Encounter: Payer: Medicare HMO | Attending: Physical Medicine & Rehabilitation | Admitting: Registered Nurse

## 2017-01-11 ENCOUNTER — Telehealth: Payer: Self-pay

## 2017-01-11 ENCOUNTER — Encounter: Payer: Self-pay | Admitting: Registered Nurse

## 2017-01-11 ENCOUNTER — Telehealth: Payer: Self-pay | Admitting: Registered Nurse

## 2017-01-11 VITALS — BP 144/90 | HR 82

## 2017-01-11 DIAGNOSIS — M545 Low back pain, unspecified: Secondary | ICD-10-CM

## 2017-01-11 DIAGNOSIS — G8929 Other chronic pain: Secondary | ICD-10-CM

## 2017-01-11 DIAGNOSIS — Z5181 Encounter for therapeutic drug level monitoring: Secondary | ICD-10-CM

## 2017-01-11 DIAGNOSIS — E119 Type 2 diabetes mellitus without complications: Secondary | ICD-10-CM | POA: Diagnosis not present

## 2017-01-11 DIAGNOSIS — M25551 Pain in right hip: Secondary | ICD-10-CM | POA: Diagnosis not present

## 2017-01-11 DIAGNOSIS — M7989 Other specified soft tissue disorders: Secondary | ICD-10-CM | POA: Diagnosis not present

## 2017-01-11 DIAGNOSIS — G894 Chronic pain syndrome: Secondary | ICD-10-CM | POA: Diagnosis not present

## 2017-01-11 DIAGNOSIS — M25552 Pain in left hip: Secondary | ICD-10-CM | POA: Diagnosis not present

## 2017-01-11 DIAGNOSIS — Z79899 Other long term (current) drug therapy: Secondary | ICD-10-CM | POA: Diagnosis not present

## 2017-01-11 DIAGNOSIS — G63 Polyneuropathy in diseases classified elsewhere: Secondary | ICD-10-CM

## 2017-01-11 DIAGNOSIS — M799 Soft tissue disorder, unspecified: Secondary | ICD-10-CM | POA: Insufficient documentation

## 2017-01-11 MED ORDER — HYDROCODONE-ACETAMINOPHEN 5-325 MG PO TABS: 1.0000 | ORAL_TABLET | Freq: Four times a day (QID) | ORAL | 0 refills | Status: DC | PRN

## 2017-01-11 NOTE — Telephone Encounter (Signed)
On 01/11/2017 the  NCCSR was reviewed no conflict was seen on the [facility] Controlled Substance Reporting System with multiple prescribers. [name] [name] has a signed narcotic contract with our office. If there were any discrepancies this would have been reported to his physician.

## 2017-01-11 NOTE — Progress Notes (Signed)
Subjective:    Patient ID: [name] [name], male    DOB: Aug 03, 1956, 61 y.o.   MRN: [id number]  HPI:  [name] [name] is a 61 year old male who returns for follow up appointment and medication refill. He states his lower back pain has increased in intensity intensityis, will order Lumbar X-ray he verbalizes understanding. Also has bilateral hip pain R >L. His current exercise regime is walking.   Pain Inventory Average Pain 6 Pain Right Now 4 My pain is intermittent, sharp, stabbing and aching  In the last 24 hours, has pain interfered with the following? General activity 4 Relation with others 5 Enjoyment of life 5 What TIME of day is your pain at its worst? evening Sleep (in general) Poor  Pain is worse with: walking, bending and inactivity Pain improves with: therapy/exercise, medication and injections Relief from Meds: 7  Mobility walk without assistance ability to climb steps?  yes do you drive?  yes  Function disabled: date disabled 2014 I need assistance with the following:  dressing  Neuro/Psych weakness numbness tingling spasms depression  Prior Studies Any changes since last visit?  no  Physicians involved in your care Any changes since last visit?  no   Family History  Problem Relation Age of Onset  . Hypertension Mother   . Alzheimer's disease Mother   . Alcohol abuse Father   . Cancer Father     "Throat"  . Diabetes type II Brother   . Heart disease Neg Hx    Social History   Social History  . Marital status: Married    Spouse name: N/A  . Number of children: N/A  . Years of education: N/A   Occupational History  . unemployed     applying for disability   Social History Main Topics  . Smoking status: Former Smoker    Packs/day: 0.25    Years: 3.00    Quit date: 11/30/1998  . Smokeless tobacco: Never Used  . Alcohol use No  . Drug use: No  . Sexual activity: Yes    Birth control/ protection: None   Other Topics Concern  . Not  on file   Social History Narrative  . No narrative on file   Past Surgical History:  Procedure Laterality Date  . LEFT HEART CATHETERIZATION WITH CORONARY ANGIOGRAM N/A 06/14/2014   Procedure: LEFT HEART CATHETERIZATION WITH CORONARY ANGIOGRAM;  Surgeon: Corky Crafts, MD;  Location: [facility] CATH LAB;  Service: Cardiovascular;  Laterality: N/A;  . TONSILLECTOMY     Past Medical History:  Diagnosis Date  . Barrett's esophagus   . CAD (coronary artery disease)    a. NSTEMI 05/2014 - occluded RCA dominant proximal s/p asp-thrombectomy/DES to RCA, minimal LAD/LCx, EF 50% by cath, 65-70% by echo.  . Depression   . Diabetes type 2, uncontrolled (HCC)   . Former tobacco use   . GERD (gastroesophageal reflux disease)   . Hemorrhoids    hx of  . Hypercholesterolemia   . Hypertension   . MI (myocardial infarction)   . Peripheral neuropathy (HCC)    There were no vitals taken for this visit.  Opioid Risk Score:   Fall Risk Score:  `1  Depression screen PHQ 2/9  Depression screen [facility] 2/9 09/15/2016 08/25/2016 08/18/2016 04/23/2016 06/16/2015  Decreased Interest 0 0 0 1 0  Down, Depressed, Hopeless 0 0 0 1 0  PHQ - 2 Score 0 0 0 2 0  Altered sleeping - - 3  1 -  Tired, decreased energy - - 2 1 -  Change in appetite - - 2 0 -  Feeling bad or failure about yourself  - - 0 0 -  Trouble concentrating - - 1 1 -  Moving slowly or fidgety/restless - - 0 0 -  Suicidal thoughts - - 0 0 -  PHQ-9 Score - - 8 5 -  Difficult doing work/chores - - Not difficult at all - -   Review of Systems  Constitutional: Positive for diaphoresis.  HENT: Negative.   Eyes: Negative.   Respiratory: Negative.   Cardiovascular: Negative.   Gastrointestinal: Negative.   Endocrine: Negative.   Genitourinary: Negative.   Musculoskeletal: Negative.   Skin: Negative.   Allergic/Immunologic: Negative.   Neurological: Negative.   Hematological: Negative.   Psychiatric/Behavioral: Negative.   All other systems  reviewed and are negative.      Objective:   Physical Exam  Constitutional: He is oriented to person, place, and time. He appears well-developed and well-nourished.  HENT:  Head: Normocephalic and atraumatic.  Neck: Normal range of motion. Neck supple.  Cardiovascular: Normal rate and regular rhythm.   Pulmonary/Chest: Effort normal and breath sounds normal.  Musculoskeletal:  Normal Muscle Bulk and Muscle Testing Reveals: Upper Extremities: Full ROM and Muscle Strength 5/5 Lumbar Paraspinal Tenderness: L-3-L-5 Lower Extremities: Full ROM and Muscle Strength 5/5 Arises from Table Slowly Narrow Based Gait  Neurological: He is alert and oriented to person, place, and time.  Skin: Skin is warm and dry.  Psychiatric: He has a normal mood and affect.  Nursing note and vitals reviewed.         Assessment & Plan:  1.Bilateral hip pain. 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 Ortho Following: Dr. Thurston Hole regarding right hip placement. 01/11/2017 Refilled: Hydrocodone 5/325 mg One tablet every 6 hours as needed #120. We will continue the opioid monitoring program, this consists of regular clinic visits, examinations, urine drug screen, pill counts as well as use of [place] Controlled Substance Reporting System.  3. Type II DM: Polyneuropathy:Continue Gabapentin: 01/11/2017  4. Acute Exacerbation of Chronic Low Back Pain: RX: Lumbar X-ray  20 minutes of face to face patient care time was spent during this visit. All questions were encouraged and answered.  F/U in 1 month

## 2017-01-11 NOTE — Telephone Encounter (Signed)
Patient was advised he must pay co-pay before every visit. 01/11/2017

## 2017-01-29 ENCOUNTER — Other Ambulatory Visit: Payer: Self-pay | Admitting: Family Medicine

## 2017-01-30 NOTE — Telephone Encounter (Signed)
Last seen 09/2016

## 2017-02-08 ENCOUNTER — Encounter: Payer: Medicare HMO | Attending: Physical Medicine & Rehabilitation | Admitting: Registered Nurse

## 2017-02-08 ENCOUNTER — Encounter: Payer: Self-pay | Admitting: Registered Nurse

## 2017-02-08 VITALS — BP 137/77 | HR 77 | Temp 97.8°F

## 2017-02-08 DIAGNOSIS — G63 Polyneuropathy in diseases classified elsewhere: Secondary | ICD-10-CM

## 2017-02-08 DIAGNOSIS — E119 Type 2 diabetes mellitus without complications: Secondary | ICD-10-CM | POA: Diagnosis not present

## 2017-02-08 DIAGNOSIS — G894 Chronic pain syndrome: Secondary | ICD-10-CM

## 2017-02-08 DIAGNOSIS — M799 Soft tissue disorder, unspecified: Secondary | ICD-10-CM | POA: Insufficient documentation

## 2017-02-08 DIAGNOSIS — M25551 Pain in right hip: Secondary | ICD-10-CM

## 2017-02-08 DIAGNOSIS — R1031 Right lower quadrant pain: Secondary | ICD-10-CM

## 2017-02-08 DIAGNOSIS — Z79899 Other long term (current) drug therapy: Secondary | ICD-10-CM

## 2017-02-08 DIAGNOSIS — M7989 Other specified soft tissue disorders: Secondary | ICD-10-CM | POA: Insufficient documentation

## 2017-02-08 DIAGNOSIS — Z5181 Encounter for therapeutic drug level monitoring: Secondary | ICD-10-CM

## 2017-02-08 DIAGNOSIS — M545 Low back pain, unspecified: Secondary | ICD-10-CM

## 2017-02-08 DIAGNOSIS — M25552 Pain in left hip: Secondary | ICD-10-CM | POA: Insufficient documentation

## 2017-02-08 MED ORDER — HYDROCODONE-ACETAMINOPHEN 5-325 MG PO TABS: 1.0000 | ORAL_TABLET | Freq: Four times a day (QID) | ORAL | 0 refills | Status: DC | PRN

## 2017-02-08 NOTE — Progress Notes (Signed)
Subjective:    Patient ID: [name] [name], male    DOB: 09/03/1956, 61 y.o.   MRN: [id number]  HPI: [name] [name] is a 61 year old male who returns for follow up appointment and medication refill. He states his pain is located in his abdomen  right lower quadrant,  lower back pain and bilateral hip pain L>R.  Also states the abdominal pain started over a week ago and has increased in intensity. Refuses for EMS to be called and states he will go to [facility] for ED evaluation. His current exercise regime is walking.   Pain Inventory Average Pain 5 Pain Right Now 7 My pain is sharp, burning, stabbing and aching  In the last 24 hours, has pain interfered with the following? General activity 4 Relation with others 5 Enjoyment of life 4 What TIME of day is your pain at its worst? evening Sleep (in general) NA  Pain is worse with: inactivity Pain improves with: heat/ice, medication and injections Relief from Meds: 7  Mobility walk without assistance how many minutes can you walk? 10 ability to climb steps?  yes do you drive?  yes  Function disabled: date disabled 2014 I need assistance with the following:  dressing  Neuro/Psych weakness numbness tingling spasms depression  Prior Studies Any changes since last visit?  no  Physicians involved in your care Any changes since last visit?  no   Family History  Problem Relation Age of Onset  . Hypertension Mother   . Alzheimer's disease Mother   . Alcohol abuse Father   . Cancer Father     "Throat"  . Diabetes type II Brother   . Heart disease Neg Hx    Social History   Social History  . Marital status: Married    Spouse name: N/A  . Number of children: N/A  . Years of education: N/A   Occupational History  . unemployed     applying for disability   Social History Main Topics  . Smoking status: Former Smoker    Packs/day: 0.25    Years: 3.00    Quit date: 11/30/1998  . Smokeless tobacco: Never Used    . Alcohol use No  . Drug use: No  . Sexual activity: Yes    Birth control/ protection: None   Other Topics Concern  . None   Social History Narrative  . None   Past Surgical History:  Procedure Laterality Date  . LEFT HEART CATHETERIZATION WITH CORONARY ANGIOGRAM N/A 06/14/2014   Procedure: LEFT HEART CATHETERIZATION WITH CORONARY ANGIOGRAM;  Surgeon: Corky Crafts, MD;  Location: [facility] CATH LAB;  Service: Cardiovascular;  Laterality: N/A;  . TONSILLECTOMY     Past Medical History:  Diagnosis Date  . Barrett's esophagus   . CAD (coronary artery disease)    a. NSTEMI 05/2014 - occluded RCA dominant proximal s/p asp-thrombectomy/DES to RCA, minimal LAD/LCx, EF 50% by cath, 65-70% by echo.  . Depression   . Diabetes type 2, uncontrolled (HCC)   . Former tobacco use   . GERD (gastroesophageal reflux disease)   . Hemorrhoids    hx of  . Hypercholesterolemia   . Hypertension   . MI (myocardial infarction)   . Peripheral neuropathy (HCC)    BP 137/77   Pulse 77   Temp 97.8 F (36.6 C)   SpO2 97%   Opioid Risk Score:   Fall Risk Score:  `1  Depression screen PHQ 2/9  Depression screen PHQ  2/9 02/08/2017 09/15/2016 08/25/2016 08/18/2016 04/23/2016 06/16/2015  Decreased Interest 0 0 0 0 1 0  Down, Depressed, Hopeless 0 0 0 0 1 0  PHQ - 2 Score 0 0 0 0 2 0  Altered sleeping - - - 3 1 -  Tired, decreased energy - - - 2 1 -  Change in appetite - - - 2 0 -  Feeling bad or failure about yourself  - - - 0 0 -  Trouble concentrating - - - 1 1 -  Moving slowly or fidgety/restless - - - 0 0 -  Suicidal thoughts - - - 0 0 -  PHQ-9 Score - - - 8 5 -  Difficult doing work/chores - - - Not difficult at all - -   Review of Systems  Constitutional: Positive for diaphoresis and unexpected weight change.  HENT: Negative.   Eyes: Negative.   Respiratory: Negative.   Cardiovascular: Negative.   Gastrointestinal: Negative.   Endocrine: Negative.   Genitourinary: Negative.    Musculoskeletal:       Spasms  Skin: Positive for color change.       Has bruise on RLQ of his abd and having pain in the area. Denies injury  Allergic/Immunologic: Negative.   Neurological: Positive for numbness.       Tingling  Hematological: Bruises/bleeds easily.       Bruise RLQ  Psychiatric/Behavioral: Positive for dysphoric mood.  All other systems reviewed and are negative.      Objective:   Physical Exam  Constitutional: He is oriented to person, place, and time. He appears well-developed and well-nourished.  HENT:  Head: Normocephalic and atraumatic.  Neck: Normal range of motion. Neck supple.  Cardiovascular: Normal rate and regular rhythm.   Pulmonary/Chest: Effort normal and breath sounds normal.  Abdominal: Soft. Bowel sounds are normal. There is tenderness. There is guarding.  Right Lower Quadrant: Tenderness with Palpation Ecchymosis Noted  Musculoskeletal:  Normal Muscle Bulk and Muscle Testing Reveals: Upper Extremities: Full ROM and Muscle Strength 5/5 Lower Extremities: Full ROM and Muscle Strength 5/5 Arises from Table with ease Narrow Based gait  Neurological: He is alert and oriented to person, place, and time.  Skin: Skin is warm and dry.  Psychiatric: He has a normal mood and affect.  Nursing note and vitals reviewed.         Assessment & Plan:  1.Bilateral hip pain. 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 Ortho Following: Dr. Thurston Hole regarding right hip placement. 02/08/2017 Refilled: Hydrocodone 5/325 mg One tablet every 6 hours as needed #120. We will continue the opioid monitoring program, this consists of regular clinic visits, examinations, urine drug screen, pill counts as well as use of [place] Controlled Substance Reporting System.  3. Type II DM: Polyneuropathy:Continue Gabapentin: 02/08/2017  4. Right Lower Quadrant Abdominal Pain: [name] [name] states he will go to [facility] ED for Evaluation Today.   Refuses EMS to be called.  30 minutes of face to face patient care time was spent during this visit. All questions were encouraged and answered.   F/U in 1 month

## 2017-02-10 ENCOUNTER — Encounter (HOSPITAL_COMMUNITY): Payer: Self-pay | Admitting: *Deleted

## 2017-02-10 ENCOUNTER — Emergency Department (HOSPITAL_COMMUNITY)
Admission: EM | Admit: 2017-02-10 | Discharge: 2017-02-10 | Disposition: A | Discharge status: Home / Self Care | Payer: Medicare HMO | Attending: Emergency Medicine | Admitting: Emergency Medicine

## 2017-02-10 DIAGNOSIS — Z794 Long term (current) use of insulin: Secondary | ICD-10-CM | POA: Insufficient documentation

## 2017-02-10 DIAGNOSIS — E1140 Type 2 diabetes mellitus with diabetic neuropathy, unspecified: Secondary | ICD-10-CM | POA: Insufficient documentation

## 2017-02-10 DIAGNOSIS — S3991XA Unspecified injury of abdomen, initial encounter: Secondary | ICD-10-CM | POA: Diagnosis present

## 2017-02-10 DIAGNOSIS — Y999 Unspecified external cause status: Secondary | ICD-10-CM | POA: Diagnosis not present

## 2017-02-10 DIAGNOSIS — X58XXXA Exposure to other specified factors, initial encounter: Secondary | ICD-10-CM | POA: Diagnosis not present

## 2017-02-10 DIAGNOSIS — Y929 Unspecified place or not applicable: Secondary | ICD-10-CM | POA: Insufficient documentation

## 2017-02-10 DIAGNOSIS — I252 Old myocardial infarction: Secondary | ICD-10-CM | POA: Insufficient documentation

## 2017-02-10 DIAGNOSIS — Z87891 Personal history of nicotine dependence: Secondary | ICD-10-CM | POA: Insufficient documentation

## 2017-02-10 DIAGNOSIS — Y939 Activity, unspecified: Secondary | ICD-10-CM | POA: Insufficient documentation

## 2017-02-10 DIAGNOSIS — Z79899 Other long term (current) drug therapy: Secondary | ICD-10-CM | POA: Insufficient documentation

## 2017-02-10 DIAGNOSIS — I10 Essential (primary) hypertension: Secondary | ICD-10-CM | POA: Insufficient documentation

## 2017-02-10 DIAGNOSIS — I2510 Atherosclerotic heart disease of native coronary artery without angina pectoris: Secondary | ICD-10-CM | POA: Insufficient documentation

## 2017-02-10 DIAGNOSIS — S301XXA Contusion of abdominal wall, initial encounter: Secondary | ICD-10-CM | POA: Diagnosis not present

## 2017-02-10 LAB — CBC WITH DIFFERENTIAL/PLATELET
BASOS PCT: 1 %
Basophils Absolute: 0 10*3/uL (ref 0.0–0.1)
Eosinophils Absolute: 0.2 10*3/uL (ref 0.0–0.7)
Eosinophils Relative: 5 %
HEMATOCRIT: 41.1 % (ref 39.0–52.0)
Hemoglobin: 14 g/dL (ref 13.0–17.0)
LYMPHS PCT: 34 %
Lymphs Abs: 1.4 10*3/uL (ref 0.7–4.0)
MCH: 27.9 pg (ref 26.0–34.0)
MCHC: 34.1 g/dL (ref 30.0–36.0)
MCV: 82 fL (ref 78.0–100.0)
MONO ABS: 0.2 10*3/uL (ref 0.1–1.0)
MONOS PCT: 6 %
NEUTROS ABS: 2.3 10*3/uL (ref 1.7–7.7)
Neutrophils Relative %: 54 %
PLATELETS: 307 10*3/uL (ref 150–400)
RBC: 5.01 MIL/uL (ref 4.22–5.81)
RDW: 13.2 % (ref 11.5–15.5)
WBC: 4.1 10*3/uL (ref 4.0–10.5)

## 2017-02-10 LAB — URINALYSIS, ROUTINE W REFLEX MICROSCOPIC
BILIRUBIN URINE: NEGATIVE
Bacteria, UA: NONE SEEN
Hgb urine dipstick: NEGATIVE
KETONES UR: NEGATIVE mg/dL
LEUKOCYTES UA: NEGATIVE
NITRITE: NEGATIVE
PH: 5 (ref 5.0–8.0)
PROTEIN: NEGATIVE mg/dL
SQUAMOUS EPITHELIAL / LPF: NONE SEEN
Specific Gravity, Urine: 1.021 (ref 1.005–1.030)

## 2017-02-10 LAB — COMPREHENSIVE METABOLIC PANEL
ALT: 17 U/L (ref 17–63)
AST: 29 U/L (ref 15–41)
Albumin: 4.3 g/dL (ref 3.5–5.0)
Alkaline Phosphatase: 116 U/L (ref 38–126)
Anion gap: 7 (ref 5–15)
BUN: 11 mg/dL (ref 6–20)
CO2: 22 mmol/L (ref 22–32)
Calcium: 8.7 mg/dL — ABNORMAL LOW (ref 8.9–10.3)
Chloride: 104 mmol/L (ref 101–111)
Creatinine, Ser: 1.04 mg/dL (ref 0.61–1.24)
GFR calc Af Amer: >60 mL/min (ref >60)
GFR calc non Af Amer: >60 mL/min (ref >60)
Glucose, Bld: 280 mg/dL — ABNORMAL HIGH (ref 65–99)
Potassium: 4.4 mmol/L (ref 3.5–5.1)
Sodium: 133 mmol/L — ABNORMAL LOW (ref 135–145)
Total Bilirubin: 0.7 mg/dL (ref 0.3–1.2)
Total Protein: 7.3 g/dL (ref 6.5–8.1)

## 2017-02-10 LAB — PROTIME-INR
INR: 0.86
Prothrombin Time: 11.7 seconds (ref 11.4–15.2)

## 2017-02-10 LAB — LIPASE, BLOOD: LIPASE: 14 U/L (ref 11–51)

## 2017-02-10 NOTE — Discharge Instructions (Signed)
You have a bruise to your abdominal wall, likely from injecting insulin.  This will resolve.  Follow up with your doctor as needed for further care.

## 2017-02-10 NOTE — ED Triage Notes (Signed)
Pt reports RLQ bruising and severe pain x 1 week.  Pt takes Prasugrel d/t cardiac stents.  Pt also reports intermittent dizziness. Denies n/v at this time.  Pt denies any injury that would have caused the ecchymosis.  States he called his family physician and was instructed to come to the ED

## 2017-02-10 NOTE — ED Provider Notes (Signed)
[facility]-EMERGENCY DEPT Provider Note   CSN: [id number] Arrival date & time: 02/10/17  1210     History   Chief Complaint Chief Complaint  Patient presents with  . Bleeding/Bruising  . Abdominal Pain    HPI [name] [name] is a 61 y.o. male.  HPI   61 year old male with history of CAD, status post cardiac stenting, diabetes, GERD, Barrett's esophagus, hemorrhoid presenting with complaints of abdominal painAnd abnormal bruising. Patient report a week ago he noticed a bruise to his right lower abdomen, with associated tenderness to the affected site. Pain is only localized to the area of bruising, mild intensity. Bruising has not worsened. There is no associated fever, chills, lightheadedness, dizziness, abnormal bleeding gum, chest pain, shortness of breath, back pain, dysuria, hematuria, rectal bleeding, or rash. No other abnormal bleeding. He denies any injury that he can recall. He has had bruising in his legs in the past that appears similar to this. He did reach out to his primary care provider who recommend patient to be seen in the ER for further evaluation. He did try sleeping on the left side to avoid touching this area. Pt does inject insulin to his abd.    Past Medical History:  Diagnosis Date  . Barrett's esophagus   . CAD (coronary artery disease)    a. NSTEMI 05/2014 - occluded RCA dominant proximal s/p asp-thrombectomy/DES to RCA, minimal LAD/LCx, EF 50% by cath, 65-70% by echo.  . Depression   . Diabetes type 2, uncontrolled (HCC)   . Former tobacco use   . GERD (gastroesophageal reflux disease)   . Hemorrhoids    hx of  . Hypercholesterolemia   . Hypertension   . MI (myocardial infarction)   . Peripheral neuropathy [facility])     Patient Active Problem List   Diagnosis Date Noted  . Chronic pain syndrome 08/18/2016  . Diabetic ketoacidosis (HCC) 04/12/2016  . Hip pain, bilateral 08/23/2015  . Chronic neck pain 08/23/2015  . DKA (diabetic ketoacidoses) (HCC)  05/30/2015  . Acute renal failure (HCC) 05/30/2015  . Restless legs 08/31/2014  . Uncontrolled diabetes mellitus type 2 with peripheral artery disease (HCC) 08/10/2014  . Old myocardial infarction 08/10/2014  . CAD (coronary artery disease) 06/18/2014  . Hyperlipidemia 06/18/2014  . NSTEMI (non-ST elevated myocardial infarction) (HCC) 06/14/2014  . Chronic pain in left shoulder 04/06/2013  . Peripheral neuropathy (HCC) 02/28/2012  . Depression 12/25/2011  . GERD (gastroesophageal reflux disease) 12/25/2011  . DM (diabetes mellitus), type 2, uncontrolled (HCC) 11/18/2011    Past Surgical History:  Procedure Laterality Date  . LEFT HEART CATHETERIZATION WITH CORONARY ANGIOGRAM N/A 06/14/2014   Procedure: LEFT HEART CATHETERIZATION WITH CORONARY ANGIOGRAM;  Surgeon: Corky Crafts, MD;  Location: [facility] CATH LAB;  Service: Cardiovascular;  Laterality: N/A;  . TONSILLECTOMY         Home Medications    Prior to Admission medications   Medication Sig Start Date End Date Taking? Authorizing Provider  ACCU-CHEK AVIVA PLUS test strip USE ONE STRIP THREE TIMES DAILY 07/16/16   Reather Littler, MD  ACCU-CHEK SOFTCLIX LANCETS lancets Use as instructed to check blood sugar 3 times per day dx code 250.02 07/10/14   Reather Littler, MD  buPROPion (WELLBUTRIN XL) 300 MG 24 hr tablet TAKE ONE TABLET BY MOUTH ONCE DAILY 08/30/15   Sherren Mocha, MD  EFFIENT 10 MG TABS tablet TAKE ONE TABLET BY MOUTH ONCE DAILY 01/30/17   Sherren Mocha, MD  EQ ASPIRIN ADULT LOW DOSE  81 MG EC tablet TAKE ONE TABLET BY MOUTH ONCE DAILY 07/02/15   Corky Crafts, MD  gabapentin (NEURONTIN) 100 MG capsule Take Two Capsules in the Morning, Afternoon and Bedtime 12/14/16   Jones Bales, NP  HYDROcodone-acetaminophen (NORCO/VICODIN) 5-325 MG tablet Take 1 tablet by mouth every 6 (six) hours as needed for moderate pain (pain). 02/08/17   Jones Bales, NP  insulin aspart (NOVOLOG FLEXPEN) 100 UNIT/ML FlexPen INJECT 30 TO 35 UNITS  SUBCUTANEOUSLY THREE TIMES DAILY 08/25/16   Sherren Mocha, MD  Insulin Glargine (LANTUS SOLOSTAR) 100 UNIT/ML Solostar Pen INJECT 36 UNITS SUBCUTANEOUSLY TWICE DAILY 08/25/16   Sherren Mocha, MD  Insulin Pen Needle (B-D UF III MINI PEN NEEDLES) 31G X 5 MM MISC Use to inject insulin 5 pen needles per day 07/10/14   Reather Littler, MD  lisinopril (PRINIVIL,ZESTRIL) 2.5 MG tablet Take 1 tablet (2.5 mg total) by mouth daily. 08/25/16   Sherren Mocha, MD  metoprolol tartrate (LOPRESSOR) 25 MG tablet Take 0.5 tablets (12.5 mg total) by mouth 2 (two) times daily. 08/25/16   Sherren Mocha, MD  nitroGLYCERIN (NITROSTAT) 0.4 MG SL tablet Place 1 tablet (0.4 mg total) under the tongue every 5 (five) minutes as needed for chest pain (up to 3 doses). 11/10/16   Corky Crafts, MD  pantoprazole (PROTONIX) 40 MG tablet Take 1 tablet (40 mg total) by mouth daily. 08/25/16   Sherren Mocha, MD  prasugrel (EFFIENT) 10 MG TABS tablet TAKE ONE TABLET BY MOUTH ONCE DAILY. MUST HAVE OFFICE VISIT BEFORE FURTHER REFILLS. 01/03/17   Sherren Mocha, MD  rosuvastatin (CRESTOR) 40 MG tablet Take 1 tablet (40 mg total) by mouth daily. 08/25/16   Sherren Mocha, MD    Family History Family History  Problem Relation Age of Onset  . Hypertension Mother   . Alzheimer's disease Mother   . Alcohol abuse Father   . Cancer Father     "Throat"  . Diabetes type II Brother   . Heart disease Neg Hx     Social History Social History  Substance Use Topics  . Smoking status: Former Smoker    Packs/day: 0.25    Years: 3.00    Quit date: 11/30/1998  . Smokeless tobacco: Never Used  . Alcohol use No     Allergies   Patient has no known allergies.   Review of Systems Review of Systems  All other systems reviewed and are negative.    Physical Exam Updated Vital Signs BP 133/73 (BP Location: Left Arm)   Pulse 77   Temp 98.9 F (37.2 C) (Oral)   Resp 18   Ht 5\' 9"  (1.753 m)   Wt 104.3 kg   SpO2 98%   BMI 33.97 kg/m   Physical Exam    Constitutional: He appears well-developed and well-nourished. No distress.  HENT:  Head: Atraumatic.  Eyes: Conjunctivae are normal.  Neck: Neck supple.  Cardiovascular: Normal rate and regular rhythm.   Pulmonary/Chest: Effort normal and breath sounds normal.  Abdominal: Soft. Bowel sounds are normal. He exhibits no distension. There is tenderness (a resolving hematoma approximately 7cm in diameter to R lower abdominal wall with mild tenderness.  no other pain).  Neurological: He is alert.  Skin: No rash noted.  Psychiatric: He has a normal mood and affect.  Nursing note and vitals reviewed.    ED Treatments / Results  Labs (all labs ordered are listed, but only abnormal results are displayed)  Labs Reviewed  COMPREHENSIVE METABOLIC PANEL - Abnormal; Notable for the following:       Result Value   Sodium 133 (*)    Glucose, Bld 280 (*)    Calcium 8.7 (*)    All other components within normal limits  CBC WITH DIFFERENTIAL/PLATELET  LIPASE, BLOOD  PROTIME-INR  URINALYSIS, ROUTINE W REFLEX MICROSCOPIC    EKG  EKG Interpretation None       Radiology No results found.  Procedures Procedures (including critical care time)  Medications Ordered in ED Medications - No data to display   Initial Impression / Assessment and Plan / ED Course  I have reviewed the triage vital signs and the nursing notes.  Pertinent labs & imaging results that were available during my care of the patient were reviewed by me and considered in my medical decision making (see chart for details).     BP 123/81 (BP Location: Right Arm)   Pulse 75   Temp 98.9 F (37.2 C) (Oral)   Resp 15   Ht 5\' 9"  (1.753 m)   Wt 104.3 kg   SpO2 95%   BMI 33.97 kg/m    Final Clinical Impressions(s) / ED Diagnoses   Final diagnoses:  Hematoma of abdominal wall, initial encounter    New Prescriptions New Prescriptions   No medications on file   3:07 PM Pt has a resolving hematoma to his right  lower abdominal wall, likely from injecting insulin.  Currently on Prasugrel.  Labs are reassuring, doubt acute abdominal pathology.  No other abnormal bleeding.  Reassurance given. Recommend f/u with pcp for further care.    Fayrene Helper, PA-C 02/10/17 1510    Arby Barrette, MD 02/10/17 1515

## 2017-03-08 ENCOUNTER — Encounter: Payer: Self-pay | Admitting: Registered Nurse

## 2017-03-08 ENCOUNTER — Encounter: Payer: Medicare HMO | Attending: Physical Medicine & Rehabilitation | Admitting: Registered Nurse

## 2017-03-08 VITALS — BP 162/96 | HR 88 | Resp 14

## 2017-03-08 DIAGNOSIS — E119 Type 2 diabetes mellitus without complications: Secondary | ICD-10-CM | POA: Diagnosis not present

## 2017-03-08 DIAGNOSIS — M7989 Other specified soft tissue disorders: Secondary | ICD-10-CM | POA: Insufficient documentation

## 2017-03-08 DIAGNOSIS — G63 Polyneuropathy in diseases classified elsewhere: Secondary | ICD-10-CM | POA: Diagnosis not present

## 2017-03-08 DIAGNOSIS — M25551 Pain in right hip: Secondary | ICD-10-CM

## 2017-03-08 DIAGNOSIS — M799 Soft tissue disorder, unspecified: Secondary | ICD-10-CM | POA: Diagnosis not present

## 2017-03-08 DIAGNOSIS — M542 Cervicalgia: Secondary | ICD-10-CM

## 2017-03-08 DIAGNOSIS — M25552 Pain in left hip: Secondary | ICD-10-CM | POA: Diagnosis not present

## 2017-03-08 DIAGNOSIS — Z5181 Encounter for therapeutic drug level monitoring: Secondary | ICD-10-CM | POA: Diagnosis not present

## 2017-03-08 DIAGNOSIS — Z79899 Other long term (current) drug therapy: Secondary | ICD-10-CM | POA: Diagnosis not present

## 2017-03-08 DIAGNOSIS — G894 Chronic pain syndrome: Secondary | ICD-10-CM

## 2017-03-08 MED ORDER — HYDROCODONE-ACETAMINOPHEN 5-325 MG PO TABS: 1.0000 | ORAL_TABLET | Freq: Four times a day (QID) | ORAL | 0 refills | Status: DC | PRN

## 2017-03-08 NOTE — Progress Notes (Addendum)
Subjective:    Patient ID: [name] [name], male    DOB: 04/22/1956, 61 y.o.   MRN: [id number]  HPI: [name] [name] is a 61 year old male who returns for follow up appointment and medication refill. He states his pain is located in his neck and bilateral hip pain L>R.  His current exercise regime is walking. [name] [name] was 12/14/2016 it was consistent.   [name] [name] went to [facility] ED on 02/10/2017 for follow up on abdominal wall  Hematoma.  Pain Inventory Average Pain 6 Pain Right Now 4 My pain is sharp, stabbing and aching  In the last 24 hours, has pain interfered with the following? General activity 4 Relation with others 6 Enjoyment of life 4 What TIME of day is your pain at its worst? evening Sleep (in general) Poor  Pain is worse with: sitting and inactivity Pain improves with: heat/ice, medication and injections Relief from Meds: 7  Mobility walk without assistance how many minutes can you walk? 5 ability to climb steps?  yes do you drive?  yes  Function disabled: date disabled 2014 I need assistance with the following:  dressing  Neuro/Psych weakness numbness tingling spasms depression  Prior Studies Any changes since last visit?  no  Physicians involved in your care Any changes since last visit?  no   Family History  Problem Relation Age of Onset  . Hypertension Mother   . Alzheimer's disease Mother   . Alcohol abuse Father   . Cancer Father     "Throat"  . Diabetes type II Brother   . Heart disease Neg Hx    Social History   Social History  . Marital status: Married    Spouse name: N/A  . Number of children: N/A  . Years of education: N/A   Occupational History  . unemployed     applying for disability   Social History Main Topics  . Smoking status: Former Smoker    Packs/day: 0.25    Years: 3.00    Quit date: 11/30/1998  . Smokeless tobacco: Never Used  . Alcohol use No  . Drug use: No  . Sexual activity: Yes   Birth control/ protection: None   Other Topics Concern  . None   Social History Narrative  . None   Past Surgical History:  Procedure Laterality Date  . LEFT HEART CATHETERIZATION WITH CORONARY ANGIOGRAM N/A 06/14/2014   Procedure: LEFT HEART CATHETERIZATION WITH CORONARY ANGIOGRAM;  Surgeon: Corky Crafts, MD;  Location: [facility] CATH LAB;  Service: Cardiovascular;  Laterality: N/A;  . TONSILLECTOMY     Past Medical History:  Diagnosis Date  . Barrett's esophagus   . CAD (coronary artery disease)    a. NSTEMI 05/2014 - occluded RCA dominant proximal s/p asp-thrombectomy/DES to RCA, minimal LAD/LCx, EF 50% by cath, 65-70% by echo.  . Depression   . Diabetes type 2, uncontrolled (HCC)   . Former tobacco use   . GERD (gastroesophageal reflux disease)   . Hemorrhoids    hx of  . Hypercholesterolemia   . Hypertension   . MI (myocardial infarction)   . Peripheral neuropathy (HCC)    BP (!) 162/96   Pulse 88   Resp 14   SpO2 98%   Opioid Risk Score:   Fall Risk Score:  `1  Depression screen PHQ 2/9  Depression screen [facility] 2/9 03/08/2017 02/08/2017 09/15/2016 08/25/2016 08/18/2016 04/23/2016 06/16/2015  Decreased Interest 0 0 0 0 0 1  0  Down, Depressed, Hopeless 0 0 0 0 0 1 0  PHQ - 2 Score 0 0 0 0 0 2 0  Altered sleeping - - - - 3 1 -  Tired, decreased energy - - - - 2 1 -  Change in appetite - - - - 2 0 -  Feeling bad or failure about yourself  - - - - 0 0 -  Trouble concentrating - - - - 1 1 -  Moving slowly or fidgety/restless - - - - 0 0 -  Suicidal thoughts - - - - 0 0 -  PHQ-9 Score - - - - 8 5 -  Difficult doing work/chores - - - - Not difficult at all - -    Review of Systems  HENT: Negative.   Eyes: Negative.   Respiratory: Negative.   Cardiovascular: Negative.   Gastrointestinal: Negative.   Endocrine: Negative.   Genitourinary: Negative.   Musculoskeletal:       Spasms  Skin: Negative.   Allergic/Immunologic: Negative.   Neurological: Positive for  weakness and numbness.       Tingling  Hematological: Negative.   Psychiatric/Behavioral: Positive for dysphoric mood.  All other systems reviewed and are negative.      Objective:   Physical Exam  Constitutional: He is oriented to person, place, and time. He appears well-developed and well-nourished.  HENT:  Head: Normocephalic and atraumatic.  Neck: Neck supple.  Cervical: Decreased ROM Flexion 30 Degrees and Bilateral Lateral Flexion 30 Degrees  Musculoskeletal:  Normal Muscle Bulk and Muscle  Testing Reveals: Upper Extremities: Full ROM and Muscle Strength 5/5 Lower Extremities: Full ROM and Muscle Strength 5/5 Arises from Table with ease Narrow Based Gait  Neurological: He is alert and oriented to person, place, and time.  Skin: Skin is warm and dry.  Psychiatric: He has a normal mood and affect.  Nursing note and vitals reviewed.         Assessment & Plan:  1.Bilateral hip pain. 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 Ortho Following: Dr. Thurston Hole regarding right hip placement. 03/08/2017 Refilled: Hydrocodone 5/325 mg One tablet every 6 hours as needed #120. We will continue the opioid monitoring program, this consists of regular clinic visits, examinations, urine drug screen, pill counts as well as use of [place] Controlled Substance Reporting System.  2. Cervicalgia: Continue with HEP as tolerated. Continue to Monitor 3. Type II DM: Polyneuropathy:Continue Gabapentin: 03/08/2017   20 minutes of face to face patient care time was spent during this visit. All questions were encouraged and answered.  F/U in 1 month

## 2017-03-08 NOTE — Progress Notes (Signed)
162

## 2017-03-17 LAB — TOXASSURE SELECT,+ANTIDEPR,UR

## 2017-03-18 ENCOUNTER — Telehealth: Payer: Self-pay | Admitting: *Deleted

## 2017-03-18 NOTE — Telephone Encounter (Signed)
Urine drug screen for this encounter is consistent for prescribed medication. He has not had bupropion prescribed since 07/2016

## 2017-04-05 ENCOUNTER — Encounter: Payer: Self-pay | Admitting: Physical Medicine & Rehabilitation

## 2017-04-05 ENCOUNTER — Encounter: Payer: Medicare HMO | Attending: Physical Medicine & Rehabilitation | Admitting: Physical Medicine & Rehabilitation

## 2017-04-05 ENCOUNTER — Encounter: Payer: Medicare HMO | Admitting: Registered Nurse

## 2017-04-05 ENCOUNTER — Ambulatory Visit: Payer: Medicare HMO | Admitting: Physical Medicine & Rehabilitation

## 2017-04-05 DIAGNOSIS — G894 Chronic pain syndrome: Secondary | ICD-10-CM | POA: Diagnosis not present

## 2017-04-05 DIAGNOSIS — E119 Type 2 diabetes mellitus without complications: Secondary | ICD-10-CM | POA: Insufficient documentation

## 2017-04-05 DIAGNOSIS — M799 Soft tissue disorder, unspecified: Secondary | ICD-10-CM | POA: Diagnosis not present

## 2017-04-05 DIAGNOSIS — M25552 Pain in left hip: Secondary | ICD-10-CM | POA: Diagnosis not present

## 2017-04-05 DIAGNOSIS — Z5181 Encounter for therapeutic drug level monitoring: Secondary | ICD-10-CM | POA: Diagnosis not present

## 2017-04-05 DIAGNOSIS — G63 Polyneuropathy in diseases classified elsewhere: Secondary | ICD-10-CM | POA: Diagnosis not present

## 2017-04-05 DIAGNOSIS — Z79899 Other long term (current) drug therapy: Secondary | ICD-10-CM | POA: Diagnosis not present

## 2017-04-05 DIAGNOSIS — M7989 Other specified soft tissue disorders: Secondary | ICD-10-CM | POA: Insufficient documentation

## 2017-04-05 DIAGNOSIS — M25551 Pain in right hip: Secondary | ICD-10-CM | POA: Insufficient documentation

## 2017-04-05 MED ORDER — HYDROCODONE-ACETAMINOPHEN 5-325 MG PO TABS: 1.0000 | ORAL_TABLET | Freq: Four times a day (QID) | ORAL | 0 refills | Status: DC | PRN

## 2017-04-05 NOTE — Progress Notes (Signed)
Subjective:    Patient ID: [name] [name], male    DOB: 16-Oct-1956, 61 y.o.   MRN: [id number]  HPI   [name] [name] is here in follow up of his chronic pain. He was in the ED in March for abnormal bruising which was felt to be due to an insulin injection.  I reviewed his meds and he continues on effient and asa as well. He has noticed bruising in places other than his abdomen. His labs recently were normal.   He has developed some cramps now and then in his hands. He has been known to be hypokalemic in the past.   He continues in school and will get his degree in December.   [name] [name] remains on hydrocodone for pain control. His pain levels are reasonable. He denies constipation. No new plans at this point from an ortho standpoint.     Pain Inventory Average Pain 4 Pain Right Now 5 My pain is intermittent, sharp, stabbing and aching  In the last 24 hours, has pain interfered with the following? General activity 4 Relation with others 5 Enjoyment of life 5 What TIME of day is your pain at its worst? evening Sleep (in general) Poor  Pain is worse with: walking, sitting and inactivity Pain improves with: rest, heat/ice, medication and injections Relief from Meds: 7  Mobility walk without assistance how many minutes can you walk? 5 ability to climb steps?  yes do you drive?  yes transfers alone Do you have any goals in this area?  yes  Function not employed: date last employed . disabled: date disabled . I need assistance with the following:  dressing Do you have any goals in this area?  no  Neuro/Psych numbness depression  Prior Studies Any changes since last visit?  no  Physicians involved in your care Any changes since last visit?  no   Family History  Problem Relation Age of Onset  . Hypertension Mother   . Alzheimer's disease Mother   . Alcohol abuse Father   . Cancer Father     "Throat"  . Diabetes type II Brother   . Heart disease Neg Hx    Social History    Social History  . Marital status: Married    Spouse name: N/A  . Number of children: N/A  . Years of education: N/A   Occupational History  . unemployed     applying for disability   Social History Main Topics  . Smoking status: Former Smoker    Packs/day: 0.25    Years: 3.00    Quit date: 11/30/1998  . Smokeless tobacco: Never Used  . Alcohol use No  . Drug use: No  . Sexual activity: Yes    Birth control/ protection: None   Other Topics Concern  . None   Social History Narrative  . None   Past Surgical History:  Procedure Laterality Date  . LEFT HEART CATHETERIZATION WITH CORONARY ANGIOGRAM N/A 06/14/2014   Procedure: LEFT HEART CATHETERIZATION WITH CORONARY ANGIOGRAM;  Surgeon: Corky Crafts, MD;  Location: [facility] CATH LAB;  Service: Cardiovascular;  Laterality: N/A;  . TONSILLECTOMY     Past Medical History:  Diagnosis Date  . Barrett's esophagus   . CAD (coronary artery disease)    a. NSTEMI 05/2014 - occluded RCA dominant proximal s/p asp-thrombectomy/DES to RCA, minimal LAD/LCx, EF 50% by cath, 65-70% by echo.  . Depression   . Diabetes type 2, uncontrolled (HCC)   . Former tobacco use   .  GERD (gastroesophageal reflux disease)   . Hemorrhoids    hx of  . Hypercholesterolemia   . Hypertension   . MI (myocardial infarction) (HCC)   . Peripheral neuropathy    BP 122/76 (BP Location: Right Arm, Patient Position: Sitting, Cuff Size: Large)   Pulse 88   Resp 14   SpO2 96%   Opioid Risk Score:   Fall Risk Score:  `1  Depression screen PHQ 2/9  Depression screen [facility] 2/9 03/08/2017 02/08/2017 09/15/2016 08/25/2016 08/18/2016 04/23/2016 06/16/2015  Decreased Interest 0 0 0 0 0 1 0  Down, Depressed, Hopeless 0 0 0 0 0 1 0  PHQ - 2 Score 0 0 0 0 0 2 0  Altered sleeping - - - - 3 1 -  Tired, decreased energy - - - - 2 1 -  Change in appetite - - - - 2 0 -  Feeling bad or failure about yourself  - - - - 0 0 -  Trouble concentrating - - - - 1 1 -  Moving slowly  or fidgety/restless - - - - 0 0 -  Suicidal thoughts - - - - 0 0 -  PHQ-9 Score - - - - 8 5 -  Difficult doing work/chores - - - - Not difficult at all - -    Review of Systems  Constitutional: Negative.   HENT: Negative.   Eyes: Negative.   Respiratory: Negative.   Cardiovascular: Negative.   Gastrointestinal: Negative.   Endocrine: Negative.   Genitourinary: Negative.   Musculoskeletal: Positive for arthralgias and back pain.  Skin: Negative.   Allergic/Immunologic: Negative.   Neurological: Positive for numbness.  Hematological: Negative.   Psychiatric/Behavioral: Positive for dysphoric mood.  All other systems reviewed and are negative.      Objective:   Physical Exam General: Alert and oriented x 3, No apparent distress HEENT:Head is normocephalic, atraumatic, PERRLA, EOMI, sclera anicteric, oral mucosa pink and moist, dentition intact, ext ear canals clear,  Neck:Supple without JVD or lymphadenopathy Heart:RRR Chest:CTA B Abdomen:Soft, non-tender, non-distended, bowel sounds positive. Extremities:No clubbing, cyanosis, or edema. Pulses are 2+ Skin:Clean and intact without signs of breakdown Neuro:Pt is cognitively appropriate with normal insight, memory, and awareness. Cranial nerves 2-12 are intact. Sensory exam appears grosslynormal. Reflexes are 2+ in all 4's. Fine motor coordination is intact. No tremors. Motor function is grossly 5/5.  Musculoskeletal:right greater than left antalgia. FABER + right greater than left. Low back with mild tenderness.   Psych:Pt's affect is appropriate. Pt is cooperative and in good spirits       Assessment & Plan:  1. Bilateral hip pain. 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  2. Left medial calf cyst. History of previous inflammation.--resolved 3. Type II DM 4. Abnormal bruising    Plan: 1. Continue norco at 5/325 one q6prn #120. We will continue the opioid monitoring  program, this consists of regular clinic visits, examinations, urine drug screen, pill counts as well as use of [place] Controlled Substance Reporting System. NCCSRS was reviewed today.   2. Reviewed safety with effient/ASA. 3. [name] regarding right hip replacement--He will potentially pursue hip replacement later this year, perhaps when he has an opportunity for a break from his classes. He hopes to finish his courses/graduate by the end of the Fall 2018. 4. Diet/exercise were discussed. Reviewed the importance of appropriate electrolyte and fluid management.  5. reiterated the use of appropriate shoewear, pacing etc.  6. May  continue low dose gabapentin 100mg  bid to qhs for cramping symptoms  15 minutesof face to face patient care time were spent during this visit. All questions were encouraged and answered. Will follow up in one month.

## 2017-04-05 NOTE — Patient Instructions (Addendum)
PLEASE FEEL FREE TO CALL OUR OFFICE WITH ANY PROBLEMS OR QUESTIONS [phone])    MAKE SURE YOU'RE GETTING ADEQUATE FLUIDS AND ELECTROLYTES

## 2017-05-05 ENCOUNTER — Encounter: Payer: Medicare HMO | Attending: Physical Medicine & Rehabilitation | Admitting: Registered Nurse

## 2017-05-05 ENCOUNTER — Encounter: Payer: Self-pay | Admitting: Registered Nurse

## 2017-05-05 ENCOUNTER — Telehealth: Payer: Self-pay | Admitting: Registered Nurse

## 2017-05-05 VITALS — BP 122/78 | HR 82

## 2017-05-05 DIAGNOSIS — M25551 Pain in right hip: Secondary | ICD-10-CM | POA: Diagnosis not present

## 2017-05-05 DIAGNOSIS — G894 Chronic pain syndrome: Secondary | ICD-10-CM

## 2017-05-05 DIAGNOSIS — M799 Soft tissue disorder, unspecified: Secondary | ICD-10-CM | POA: Diagnosis not present

## 2017-05-05 DIAGNOSIS — M542 Cervicalgia: Secondary | ICD-10-CM

## 2017-05-05 DIAGNOSIS — Z79899 Other long term (current) drug therapy: Secondary | ICD-10-CM

## 2017-05-05 DIAGNOSIS — M25552 Pain in left hip: Secondary | ICD-10-CM

## 2017-05-05 DIAGNOSIS — G63 Polyneuropathy in diseases classified elsewhere: Secondary | ICD-10-CM

## 2017-05-05 DIAGNOSIS — M7989 Other specified soft tissue disorders: Secondary | ICD-10-CM | POA: Insufficient documentation

## 2017-05-05 DIAGNOSIS — Z5181 Encounter for therapeutic drug level monitoring: Secondary | ICD-10-CM

## 2017-05-05 DIAGNOSIS — E119 Type 2 diabetes mellitus without complications: Secondary | ICD-10-CM | POA: Insufficient documentation

## 2017-05-05 MED ORDER — HYDROCODONE-ACETAMINOPHEN 5-325 MG PO TABS: 1.0000 | ORAL_TABLET | Freq: Four times a day (QID) | ORAL | 0 refills | Status: DC | PRN

## 2017-05-05 NOTE — Progress Notes (Signed)
Subjective:    Patient ID: [name] [name], male    DOB: [birth year], 61 y.o.   MRN: [id number]  HPI: [name] is a 61 year old male who returns for follow up appointment and medication refill. He states his pain is located in his neck and bilateral hip pain L>R.  His current exercise regime is walking.  Mr. Callery last UDS was 03/08/2017 it was consistent.    Pain Inventory Average Pain 5 Pain Right Now 4 My pain is sharp, burning, dull and aching  In the last 24 hours, has pain interfered with the following? General activity 4 Relation with others 5 Enjoyment of life 3 What TIME of day is your pain at its worst? morning Sleep (in general) Fair  Pain is worse with: walking and inactivity Pain improves with: heat/ice, medication and injections Relief from Meds: 7  Mobility walk without assistance ability to climb steps?  yes do you drive?  yes needs help with transfers  Function disabled: date disabled 2014  Neuro/Psych numbness tingling spasms depression  Prior Studies Any changes since last visit?  no  Physicians involved in your care Any changes since last visit?  no   Family History  Problem Relation Age of Onset  . Hypertension Mother   . Alzheimer's disease Mother   . Alcohol abuse Father   . Cancer Father        "Throat"  . Diabetes type II Brother   . Heart disease Neg Hx    Social History   Social History  . Marital status: Married    Spouse name: N/A  . Number of children: N/A  . Years of education: N/A   Occupational History  . unemployed     applying for disability   Social History Main Topics  . Smoking status: Former Smoker    Packs/day: 0.25    Years: 3.00    Quit date: 11/30/1998  . Smokeless tobacco: Never Used  . Alcohol use No  . Drug use: No  . Sexual activity: Yes    Birth control/ protection: None   Other Topics Concern  . None   Social History Narrative  . None   Past Surgical History:  Procedure  Laterality Date  . LEFT HEART CATHETERIZATION WITH CORONARY ANGIOGRAM N/A 06/14/2014   Procedure: LEFT HEART CATHETERIZATION WITH CORONARY ANGIOGRAM;  Surgeon: Corky Crafts, MD;  Location: [facility] CATH LAB;  Service: Cardiovascular;  Laterality: N/A;  . TONSILLECTOMY     Past Medical History:  Diagnosis Date  . Barrett's esophagus   . CAD (coronary artery disease)    a. NSTEMI 05/2014 - occluded RCA dominant proximal s/p asp-thrombectomy/DES to RCA, minimal LAD/LCx, EF 50% by cath, 65-70% by echo.  . Depression   . Diabetes type 2, uncontrolled (HCC)   . Former tobacco use   . GERD (gastroesophageal reflux disease)   . Hemorrhoids    hx of  . Hypercholesterolemia   . Hypertension   . MI (myocardial infarction) (HCC)   . Peripheral neuropathy    BP 122/78   Pulse 82   SpO2 96%   Opioid Risk Score:   Fall Risk Score:  `1  Depression screen PHQ 2/9  Depression screen [facility] 2/9 03/08/2017 02/08/2017 09/15/2016 08/25/2016 08/18/2016 04/23/2016 06/16/2015  Decreased Interest 0 0 0 0 0 1 0  Down, Depressed, Hopeless 0 0 0 0 0 1 0  PHQ - 2 Score 0 0 0 0 0 2 0  Altered sleeping - - - -  3 1 -  Tired, decreased energy - - - - 2 1 -  Change in appetite - - - - 2 0 -  Feeling bad or failure about yourself  - - - - 0 0 -  Trouble concentrating - - - - 1 1 -  Moving slowly or fidgety/restless - - - - 0 0 -  Suicidal thoughts - - - - 0 0 -  PHQ-9 Score - - - - 8 5 -  Difficult doing work/chores - - - - Not difficult at all - -    Review of Systems  Constitutional: Negative.   HENT: Negative.   Respiratory: Negative.   Cardiovascular: Negative.   Gastrointestinal: Negative.   Endocrine: Negative.   Genitourinary: Negative.   Musculoskeletal: Negative.   Skin: Negative.   Allergic/Immunologic: Negative.   Neurological: Negative.   Hematological: Negative.   Psychiatric/Behavioral: Negative.   All other systems reviewed and are negative.      Objective:   Physical Exam    Constitutional: He is oriented to person, place, and time. He appears well-developed and well-nourished.  HENT:  Head: Normocephalic and atraumatic.  Neck: Normal range of motion. Neck supple.  Cardiovascular: Normal rate and regular rhythm.   Pulmonary/Chest: Effort normal and breath sounds normal.  Musculoskeletal:  Normal Muscle Bulk and Muscle Testing Reveals: Upper Extremities: Full ROM and Muscle Strength 5/5 Left Greater Trochanter Tenderness Lower Extremities: Full ROM and Muscle Strength 5/5 Arises from Table with ease Narrow Based Gait  Neurological: He is alert and oriented to person, place, and time.  Skin: Skin is warm and dry.  Psychiatric: He has a normal mood and affect.  Nursing note and vitals reviewed.         Assessment & Plan:  1.Bilateral hip pain. 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 Ortho Following: Dr. Thurston Hole regarding right hip placement. 05/05/2017 Refilled: Hydrocodone 5/325 mg One tablet every 6 hours as needed #120. We will continue the opioid monitoring program, this consists of regular clinic visits, examinations, urine drug screen, pill counts as well as use of [place] Controlled Substance Reporting System.  2. Cervicalgia: Ortho Following: Continue with HEP as tolerated. Continue to Monitor. 05/05/2017 3. Type II DM: Polyneuropathy:Continue Gabapentin: 05/05/2017   20 minutes of face to face patient care time was spent during this visit. All questions were encouraged and answered.   F/U in 1 month

## 2017-05-05 NOTE — Telephone Encounter (Signed)
On 05/05/2017 the NCCSR was reviewed no conflict was seen on the [facility] Reporting System with multiple prescribers. [name] [name] has a signed narcotic contract with our office. If there were any discrepancies this would have been reported to his physician.

## 2017-05-10 ENCOUNTER — Ambulatory Visit: Payer: Medicare HMO | Admitting: Registered Nurse

## 2017-05-10 ENCOUNTER — Ambulatory Visit: Payer: Medicare HMO | Admitting: Physical Medicine & Rehabilitation

## 2017-05-29 ENCOUNTER — Other Ambulatory Visit: Payer: Self-pay | Admitting: Endocrinology

## 2017-05-31 ENCOUNTER — Encounter: Payer: Self-pay | Admitting: Registered Nurse

## 2017-05-31 ENCOUNTER — Encounter: Payer: Medicare HMO | Attending: Physical Medicine & Rehabilitation | Admitting: Registered Nurse

## 2017-05-31 VITALS — BP 118/72 | HR 88 | Resp 14

## 2017-05-31 DIAGNOSIS — Z5181 Encounter for therapeutic drug level monitoring: Secondary | ICD-10-CM | POA: Diagnosis not present

## 2017-05-31 DIAGNOSIS — M25551 Pain in right hip: Secondary | ICD-10-CM

## 2017-05-31 DIAGNOSIS — Z79899 Other long term (current) drug therapy: Secondary | ICD-10-CM | POA: Diagnosis not present

## 2017-05-31 DIAGNOSIS — M7989 Other specified soft tissue disorders: Secondary | ICD-10-CM | POA: Insufficient documentation

## 2017-05-31 DIAGNOSIS — G63 Polyneuropathy in diseases classified elsewhere: Secondary | ICD-10-CM | POA: Diagnosis not present

## 2017-05-31 DIAGNOSIS — E119 Type 2 diabetes mellitus without complications: Secondary | ICD-10-CM | POA: Insufficient documentation

## 2017-05-31 DIAGNOSIS — M25552 Pain in left hip: Secondary | ICD-10-CM | POA: Diagnosis not present

## 2017-05-31 DIAGNOSIS — M799 Soft tissue disorder, unspecified: Secondary | ICD-10-CM | POA: Insufficient documentation

## 2017-05-31 DIAGNOSIS — G894 Chronic pain syndrome: Secondary | ICD-10-CM

## 2017-05-31 DIAGNOSIS — M542 Cervicalgia: Secondary | ICD-10-CM

## 2017-05-31 MED ORDER — HYDROCODONE-ACETAMINOPHEN 5-325 MG PO TABS: 1.0000 | ORAL_TABLET | Freq: Four times a day (QID) | ORAL | 0 refills | Status: DC | PRN

## 2017-05-31 NOTE — Progress Notes (Deleted)
Subjective:    Patient ID: [name] [name], male    DOB: 30-Jun-1956, 61 y.o.   MRN: [id number]  HPI   Pain Inventory Average Pain 5 Pain Right Now 3 My pain is sharp, stabbing, tingling and aching  In the last 24 hours, has pain interfered with the following? General activity 0 Relation with others 0 Enjoyment of life 0 What TIME of day is your pain at its worst? evening, night Sleep (in general) Poor  Pain is worse with: sitting and inactivity Pain improves with: rest, medication and injections Relief from Meds: 7  Mobility walk without assistance how many minutes can you walk? 10 ability to climb steps?  {REFRESHABLE YES WU:98119147} do you drive?  {REFRESHABLE YES WG:95621308} Do you have any goals in this area?  {REFRESHABLE YES [id number]  Function disabled: date disabled . I need assistance with the following:  dressing Do you have any goals in this area?  yes  Neuro/Psych {NEURO/PSYCH:21022948}  Prior Studies {CPRM PRIOR STUDIES:21022953}  Physicians involved in your care {CPRM PHYSICIANS INVOLVED IN YOUR CARE:21022954}   Family History  Problem Relation Age of Onset  . Hypertension Mother   . Alzheimer's disease Mother   . Alcohol abuse Father   . Cancer Father        "Throat"  . Diabetes type II Brother   . Heart disease Neg Hx    Social History   Social History  . Marital status: Married    Spouse name: N/A  . Number of children: N/A  . Years of education: N/A   Occupational History  . unemployed     applying for disability   Social History Main Topics  . Smoking status: Former Smoker    Packs/day: 0.25    Years: 3.00    Quit date: 11/30/1998  . Smokeless tobacco: Never Used  . Alcohol use No  . Drug use: No  . Sexual activity: Yes    Birth control/ protection: None   Other Topics Concern  . None   Social History Narrative  . None   Past Surgical History:  Procedure Laterality Date  . LEFT HEART CATHETERIZATION WITH  CORONARY ANGIOGRAM N/A 06/14/2014   Procedure: LEFT HEART CATHETERIZATION WITH CORONARY ANGIOGRAM;  Surgeon: Corky Crafts, MD;  Location: [facility] CATH LAB;  Service: Cardiovascular;  Laterality: N/A;  . TONSILLECTOMY     Past Medical History:  Diagnosis Date  . Barrett's esophagus   . CAD (coronary artery disease)    a. NSTEMI 05/2014 - occluded RCA dominant proximal s/p asp-thrombectomy/DES to RCA, minimal LAD/LCx, EF 50% by cath, 65-70% by echo.  . Depression   . Diabetes type 2, uncontrolled (HCC)   . Former tobacco use   . GERD (gastroesophageal reflux disease)   . Hemorrhoids    hx of  . Hypercholesterolemia   . Hypertension   . MI (myocardial infarction) (HCC)   . Peripheral neuropathy    BP 118/72 (BP Location: Left Arm, Patient Position: Sitting, Cuff Size: Large)   Pulse 88   Resp 14   SpO2 96%   Opioid Risk Score:   Fall Risk Score:  `1  Depression screen PHQ 2/9  Depression screen [facility] 2/9 03/08/2017 02/08/2017 09/15/2016 08/25/2016 08/18/2016 04/23/2016 06/16/2015  Decreased Interest 0 0 0 0 0 1 0  Down, Depressed, Hopeless 0 0 0 0 0 1 0  PHQ - 2 Score 0 0 0 0 0 2 0  Altered sleeping - - - - 3 1 -  Tired, decreased energy - - - - 2 1 -  Change in appetite - - - - 2 0 -  Feeling bad or failure about yourself  - - - - 0 0 -  Trouble concentrating - - - - 1 1 -  Moving slowly or fidgety/restless - - - - 0 0 -  Suicidal thoughts - - - - 0 0 -  PHQ-9 Score - - - - 8 5 -  Difficult doing work/chores - - - - Not difficult at all - -    Review of Systems     Objective:   Physical Exam        Assessment & Plan:

## 2017-05-31 NOTE — Progress Notes (Addendum)
Subjective:    Patient ID: [name] [name], male    DOB: 1956-06-11, 61 y.o.   MRN: [id number]  HPI: [name] [name] is a 61 year old male who returns for follow up appointment and medication refill. He states his pain is located in his neck and bilateral hips. His current exercise regime is walking.  [name] [name] last UDS was 03/08/2017 it was consistent. UDS ordered today.    Pain Inventory Average Pain 5 Pain Right Now 3 My pain is sharp, stabbing, tingling and aching  In the last 24 hours, has pain interfered with the following? General activity 0 Relation with others 0 Enjoyment of life 0 What TIME of day is your pain at its worst? evening, night Sleep (in general) Poor  Pain is worse with: sitting and inactivity Pain improves with: rest, medication and injections Relief from Meds: 7  Mobility walk without assistance how many minutes can you walk? 10 ability to climb steps?  yes do you drive?  yes Do you have any goals in this area?  yes  Function disabled: date disabled . I need assistance with the following:  dressing Do you have any goals in this area?  yes  Neuro/Psych weakness numbness tingling spasms depression  Prior Studies Any changes since last visit?  no  Physicians involved in your care Any changes since last visit?  no   Family History  Problem Relation Age of Onset  . Hypertension Mother   . Alzheimer's disease Mother   . Alcohol abuse Father   . Cancer Father        "Throat"  . Diabetes type II Brother   . Heart disease Neg Hx    Social History   Social History  . Marital status: Married    Spouse name: N/A  . Number of children: N/A  . Years of education: N/A   Occupational History  . unemployed     applying for disability   Social History Main Topics  . Smoking status: Former Smoker    Packs/day: 0.25    Years: 3.00    Quit date: 11/30/1998  . Smokeless tobacco: Never Used  . Alcohol use No  . Drug use: No  . Sexual  activity: Yes    Birth control/ protection: None   Other Topics Concern  . None   Social History Narrative  . None   Past Surgical History:  Procedure Laterality Date  . LEFT HEART CATHETERIZATION WITH CORONARY ANGIOGRAM N/A 06/14/2014   Procedure: LEFT HEART CATHETERIZATION WITH CORONARY ANGIOGRAM;  Surgeon: Corky Crafts, MD;  Location: [facility] CATH LAB;  Service: Cardiovascular;  Laterality: N/A;  . TONSILLECTOMY     Past Medical History:  Diagnosis Date  . Barrett's esophagus   . CAD (coronary artery disease)    a. NSTEMI 05/2014 - occluded RCA dominant proximal s/p asp-thrombectomy/DES to RCA, minimal LAD/LCx, EF 50% by cath, 65-70% by echo.  . Depression   . Diabetes type 2, uncontrolled (HCC)   . Former tobacco use   . GERD (gastroesophageal reflux disease)   . Hemorrhoids    hx of  . Hypercholesterolemia   . Hypertension   . MI (myocardial infarction) (HCC)   . Peripheral neuropathy    BP 118/72 (BP Location: Left Arm, Patient Position: Sitting, Cuff Size: Large)   Pulse 88   Resp 14   SpO2 96%   Opioid Risk Score:   Fall Risk Score:  `1  Depression screen [facility] 2/9  Depression screen [facility] 2/9 03/08/2017 02/08/2017 09/15/2016 08/25/2016 08/18/2016 04/23/2016 06/16/2015  Decreased Interest 0 0 0 0 0 1 0  Down, Depressed, Hopeless 0 0 0 0 0 1 0  PHQ - 2 Score 0 0 0 0 0 2 0  Altered sleeping - - - - 3 1 -  Tired, decreased energy - - - - 2 1 -  Change in appetite - - - - 2 0 -  Feeling bad or failure about yourself  - - - - 0 0 -  Trouble concentrating - - - - 1 1 -  Moving slowly or fidgety/restless - - - - 0 0 -  Suicidal thoughts - - - - 0 0 -  PHQ-9 Score - - - - 8 5 -  Difficult doing work/chores - - - - Not difficult at all - -    Review of Systems  Constitutional: Positive for unexpected weight change.  HENT: Negative.   Eyes: Negative.   Respiratory: Negative.   Cardiovascular: Negative.   Gastrointestinal: Negative.   Endocrine:       High blood  sugar  Genitourinary: Negative.   Musculoskeletal: Positive for arthralgias and neck pain.  Skin: Negative.   Allergic/Immunologic: Negative.   Neurological: Positive for weakness and numbness.       Tingling  Hematological: Negative.   Psychiatric/Behavioral: Positive for dysphoric mood.  All other systems reviewed and are negative.      Objective:   Physical Exam  Constitutional: He is oriented to person, place, and time. He appears well-developed and well-nourished.  HENT:  Head: Normocephalic and atraumatic.  Neck: Normal range of motion. Neck supple.  Cervical Paraspinal Tenderness: C-5-C-6  Cardiovascular: Normal rate and regular rhythm.   Pulmonary/Chest: Effort normal and breath sounds normal.  Musculoskeletal:  Normal Muscle Bulk and Muscle Testing Reveals: Upper Extremities: Full ROM and Muscle Strength 5/5 Bilateral Greater Trochanter Tenderness Lower Extremities: Full ROM and Muscle Strength 5/5 Right Lower Extremity Flexion Produces Pain into Right Hip Arises from Table with ease Narrow Based Gait    Neurological: He is alert and oriented to person, place, and time.  Skin: Skin is warm and dry.  Psychiatric: He has a normal mood and affect.  Nursing note and vitals reviewed.         Assessment & Plan:  1.Bilateral hip pain. 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 Ortho Following: Dr. Thurston Hole regarding right hip placement. 05/31/2017 Refilled: Hydrocodone 5/325 mg One tablet every 6 hours as needed #120. We will continue the opioid monitoring program, this consists of regular clinic visits, examinations, urine drug screen, pill counts as well as use of [place] Controlled Substance Reporting System.  2. Cervicalgia: Ortho Following: Continue with HEP as tolerated. Continue to Monitor. 05/31/2017 3. Type II DM: Polyneuropathy:Continue Gabapentin: 05/31/2017   20  minutes of face to face patient care time was spent during  this visit. All questions were encouraged and answered.  F/U in 1 month

## 2017-06-02 ENCOUNTER — Other Ambulatory Visit: Payer: Self-pay | Admitting: Endocrinology

## 2017-06-04 LAB — TOXASSURE SELECT,+ANTIDEPR,UR

## 2017-06-09 ENCOUNTER — Telehealth: Payer: Self-pay | Admitting: *Deleted

## 2017-06-09 NOTE — Telephone Encounter (Addendum)
Urine drug screen for this encounter is consistent for prescribed medication. By EPIC bupropion has not been prescribed since 08/2015.

## 2017-06-19 ENCOUNTER — Encounter: Payer: Self-pay | Admitting: Family Medicine

## 2017-06-19 ENCOUNTER — Other Ambulatory Visit: Payer: Self-pay

## 2017-06-19 MED ORDER — INSULIN GLARGINE 100 UNIT/ML SOLOSTAR PEN: PEN_INJECTOR | SUBCUTANEOUS | 0 refills | Status: DC

## 2017-06-19 NOTE — Telephone Encounter (Signed)
Pt has not been seen in 10 months for his DM.  Endocrinology refused to refill insulin. However, if we refuse to refill pt's insulin, he is going to end-up back in the hosp with DKA. Please CALL Mr. Faulk and have his schedule a f/u OV with me asap for DM check (not ok to combine this with his CPE/AWV) with FASTING labs. I already send him a Statistician but due to the serious nature of the med and I know how busy pt is and how amazing he is at procrastinating with his doctor's visit so would like to make sure we speak directly to him (and not just LVM). Thanks.

## 2017-06-21 NOTE — Telephone Encounter (Signed)
mychart message sent to pt about making an apt °

## 2017-06-25 ENCOUNTER — Ambulatory Visit (INDEPENDENT_AMBULATORY_CARE_PROVIDER_SITE_OTHER): Payer: Medicare HMO | Admitting: Family Medicine

## 2017-06-25 ENCOUNTER — Encounter: Payer: Self-pay | Admitting: Family Medicine

## 2017-06-25 VITALS — BP 130/77 | HR 77 | Temp 98.2°F | Resp 16 | Ht 69.0 in | Wt 234.0 lb

## 2017-06-25 DIAGNOSIS — E7800 Pure hypercholesterolemia, unspecified: Secondary | ICD-10-CM | POA: Diagnosis not present

## 2017-06-25 DIAGNOSIS — I25118 Atherosclerotic heart disease of native coronary artery with other forms of angina pectoris: Secondary | ICD-10-CM | POA: Diagnosis not present

## 2017-06-25 DIAGNOSIS — K047 Periapical abscess without sinus: Secondary | ICD-10-CM | POA: Diagnosis not present

## 2017-06-25 DIAGNOSIS — E1165 Type 2 diabetes mellitus with hyperglycemia: Secondary | ICD-10-CM | POA: Diagnosis not present

## 2017-06-25 DIAGNOSIS — Z794 Long term (current) use of insulin: Secondary | ICD-10-CM

## 2017-06-25 LAB — POCT GLYCOSYLATED HEMOGLOBIN (HGB A1C): Hemoglobin A1C: 8.7

## 2017-06-25 MED ORDER — PRASUGREL HCL 10 MG PO TABS: 10.0000 mg | ORAL_TABLET | Freq: Every day | ORAL | 1 refills | Status: DC

## 2017-06-25 MED ORDER — INSULIN GLARGINE 100 UNIT/ML SOLOSTAR PEN: PEN_INJECTOR | SUBCUTANEOUS | 1 refills | Status: DC

## 2017-06-25 MED ORDER — INSULIN ASPART 100 UNIT/ML FLEXPEN: PEN_INJECTOR | SUBCUTANEOUS | 1 refills | Status: DC

## 2017-06-25 MED ORDER — METOPROLOL TARTRATE 25 MG PO TABS: 12.5000 mg | ORAL_TABLET | Freq: Two times a day (BID) | ORAL | 1 refills | Status: DC

## 2017-06-25 MED ORDER — BLOOD GLUCOSE MONITOR KIT: PACK | 0 refills | Status: DC

## 2017-06-25 MED ORDER — LISINOPRIL 2.5 MG PO TABS: 2.5000 mg | ORAL_TABLET | Freq: Every day | ORAL | 1 refills | Status: DC

## 2017-06-25 MED ORDER — INSULIN PEN NEEDLE 31G X 5 MM MISC: 2 refills | Status: DC

## 2017-06-25 MED ORDER — PENICILLIN V POTASSIUM 500 MG PO TABS: 500.0000 mg | ORAL_TABLET | Freq: Four times a day (QID) | ORAL | 0 refills | Status: DC

## 2017-06-25 MED ORDER — PANTOPRAZOLE SODIUM 40 MG PO TBEC: 40.0000 mg | DELAYED_RELEASE_TABLET | Freq: Every day | ORAL | 3 refills | Status: DC

## 2017-06-25 MED ORDER — ROSUVASTATIN CALCIUM 40 MG PO TABS: 40.0000 mg | ORAL_TABLET | Freq: Every day | ORAL | 1 refills | Status: DC

## 2017-06-25 NOTE — Patient Instructions (Addendum)
Restart the lisinopril and the crestor. You can take the lowest dose of lisinopril possible if it is making you dizzy or lightheaded with position change - try cutting it into a quarter or taking 1/2 tab every other day.   I need to see you back in 4 months for refills. I gave you 6 months of meds so you have plenty of time.     IF you received an x-ray today, you will receive an invoice from [facility]. Please contact [facility] at [phone] with questions or concerns regarding your invoice.   IF you received labwork today, you will receive an invoice from [facility]. Please contact [facility] at [phone] with questions or concerns regarding your invoice.   Our billing staff will not be able to assist you with questions regarding bills from these companies.  You will be contacted with the lab results as soon as they are available. The fastest way to get your results is to activate your My Chart account. Instructions are located on the last page of this paperwork. If you have not heard from [place] regarding the results in 2 weeks, please contact this office.      How to Avoid Diabetes Mellitus Problems You can take action to prevent or slow down problems that are caused by diabetes (diabetes mellitus). Following your diabetes plan and taking care of yourself can reduce your risk of serious or life-threatening complications. Manage your diabetes  Follow instructions from your health care providers about managing your diabetes. Your diabetes may be managed by a team of health care providers who can teach you how to care for yourself and can answer questions that you have.  Educate yourself about your condition so you can make healthy choices about eating and physical activity.  Check your blood sugar (glucose) levels as often as directed. Your health care provider will help you decide how often to check your blood glucose level depending on your treatment goals and how well  you are meeting them.  Ask your health care provider if you should take low-dose aspirin daily and what dose is recommended for you. Taking low-dose aspirin daily is recommended to help prevent cardiovascular disease. Do not use nicotine or tobacco Do not use any products that contain nicotine or tobacco, such as cigarettes and e-cigarettes. If you need help quitting, ask your health care provider. Nicotine raises your risk for diabetes problems. If you quit using nicotine:  You will lower your risk for heart attack, stroke, nerve disease, and kidney disease.  Your cholesterol and blood pressure may improve.  Your blood circulation will improve.  Keep your blood pressure under control To control your blood pressure:  Follow instructions from your health care provider about meal planning, exercise, and medicines.  Make sure your health care provider checks your blood pressure at every medical visit.  A blood pressure reading consists of two numbers. Generally, the goal is to keep your top number (systolic pressure) at or below 130, and your bottom number (diastolic pressure) at or below 80. Your health care provider may recommend a lower target blood pressure. Your individualized target blood pressure is determined based on:  Your age.  Your medicines.  How long you have had diabetes.  Any other medical conditions you have.  Keep your cholesterol under control To control your cholesterol:  Follow instructions from your health care provider about meal planning, exercise, and medicines.  Have your cholesterol checked at least once a year.  You may be  prescribed medicine to lower cholesterol (statin). If you are not taking a statin, ask your health care provider if you should be.  Controlling your cholesterol may:  Help prevent heart disease and stroke. These are the most common health problems for people with diabetes.  Improve your blood flow.  Schedule and keep yearly  physical exams and eye exams Your health care provider will tell you how often you need medical visits depending on your diabetes management plan. Keep all follow-up visits as directed. This is important so possible problems can be identified early and complications can be avoided or treated.  Every visit with your health care provider should include measuring your: ? Weight. ? Blood pressure. ? Blood glucose control.  Your A1c (hemoglobin A1c) level should be checked: ? At least 2 times a year, if you are meeting your treatment goals. ? 4 times a year, if you are not meeting treatment goals or if your treatment goals have changed.  Your blood lipids (lipid profile) should be checked yearly. You should also be checked yearly for protein in your urine (urine microalbumin).  If you have type 1 diabetes, get an eye exam 3-5 years after you are diagnosed, and then once a year after your first exam.  If you have type 2 diabetes, get an eye exam as soon as you are diagnosed, and then once a year after your first exam.  Keep your vaccines current It is recommended that you receive:  pneumonia (pneumococcal) vaccine and a hepatitis B vaccine. If you are age 65 or older, you may get the pneumonia vaccine as a series of two separate shots.  Ask your health care provider which other vaccines may be recommended. Take care of your feet Diabetes may cause you to have poor blood circulation to your legs and feet. Because of this, taking care of your feet is very important. Diabetes can cause:  The skin on the feet to get thinner, break more easily, and heal more slowly.  Nerve damage in your legs and feet, which results in decreased feeling. You may not notice minor injuries that could lead to serious problems.  To avoid foot problems:  Check your skin and feet every day for cuts, bruises, redness, blisters, or sores.  Schedule a foot exam with your health care provider once every year. This exam  includes: ? Inspecting of the structure and skin of your feet. ? Checking the pulses and sensation in your feet.  Make sure that your health care provider performs a visual foot exam at every medical visit.  Take care of your teeth People with poorly controlled diabetes are more likely to have gum (periodontal) disease. Diabetes can make periodontal diseases harder to control. If not treated, periodontal diseases can lead to tooth loss. To prevent this:  Brush your teeth twice a day.  Floss at least once a day.  Visit your dentist 2 times a year.  Drink responsibly Limit alcohol intake to no more than 1 drink a day for nonpregnant women and 2 drinks a day for men. One drink equals 12 oz of beer, 5 oz of wine, or 1 oz of hard liquor. It is important to eat food when you drink alcohol to avoid low blood glucose (hypoglycemia). Avoid alcohol if you:  Have a history of alcohol abuse or dependence.  Are pregnant.  Have liver disease, pancreatitis, advanced neuropathy, or severe hypertriglyceridemia.  Lessen stress Living with diabetes can be stressful. When you are experiencing stress, your  blood glucose may be affected in two ways:  Stress hormones may cause your blood glucose to rise.  You may be distracted from taking good care of yourself.  Be aware of your stress level and make changes to help you manage challenging situations. To lower your stress levels:  Consider joining a support group.  Do planned relaxation or meditation.  Do a hobby that you enjoy.  Maintain healthy relationships.  Exercise regularly.  Work with your health care provider or a mental health professional.  Summary  You can take action to prevent or slow down problems that are caused by diabetes (diabetes mellitus). Following your diabetes plan and taking care of yourself can reduce your risk of serious or life-threatening complications.  Follow instructions from your health care providers about  managing your diabetes. Your diabetes may be managed by a team of health care providers who can teach you how to care for yourself and can answer questions that you have.  Your health care provider will tell you how often you need medical visits depending on your diabetes management plan. Keep all follow-up visits as directed. This is important so possible problems can be identified early and complications can be avoided or treated. This information is not intended to replace advice given to you by your health care provider. Make sure you discuss any questions you have with your health care provider. Document Released: 08/04/2011 Document Revised: 08/15/2016 Document Reviewed: 08/15/2016 Elsevier Interactive Patient Education  2018 [employer].  Diabetes Mellitus and Standards of Medical Care Managing diabetes (diabetes mellitus) can be complicated. Your diabetes treatment may be managed by a team of health care providers, including:  A diet and nutrition specialist (registered dietitian).  A nurse.  A certified diabetes educator (CDE).  A diabetes specialist (endocrinologist).  An eye doctor.  A primary care provider.  A dentist.  Your health care providers follow a schedule in order to help you get the best quality of care. The following schedule is a general guideline for your diabetes management plan. Your health care providers may also give you more specific instructions. HbA1c ( hemoglobin A1c) test This test provides information about blood sugar (glucose) control over the previous 2-3 months. It is used to check whether your diabetes management plan needs to be adjusted.  If you are meeting your treatment goals, this test is done at least 2 times a year.  If you are not meeting treatment goals or if your treatment goals have changed, this test is done 4 times a year.  Blood pressure test  This test is done at every routine medical visit. For most people, the goal is less  than 130/80. Ask your health care provider what your goal blood pressure should be. Dental and eye exams  Visit your dentist two times a year.  If you have type 1 diabetes, get an eye exam 3-5 years after you are diagnosed, and then once a year after your first exam. ? If you were diagnosed with type 1 diabetes as a child, get an eye exam when you are age 49 or older and have had diabetes for 3-5 years. After the first exam, you should get an eye exam once a year.  If you have type 2 diabetes, have an eye exam as soon as you are diagnosed, and then once a year after your first exam. Foot care exam  Visual foot exams are done at every routine medical visit. The exams check for cuts, bruises, redness, blisters,  sores, or other problems with the feet.  A complete foot exam is done by your health care provider once a year. This exam includes an inspection of the structure and skin of your feet, and a check of the pulses and sensation in your feet. ? Type 1 diabetes: Get your first exam 3-5 years after diagnosis. ? Type 2 diabetes: Get your first exam as soon as you are diagnosed.  Check your feet every day for cuts, bruises, redness, blisters, or sores. If you have any of these or other problems that are not healing, contact your health care provider. Kidney function test ( urine microalbumin)  This test is done once a year. ? Type 1 diabetes: Get your first test 5 years after diagnosis. ? Type 2 diabetes: Get your first test as soon as you are diagnosed.  If you have chronic kidney disease (CKD), get a serum creatinine and estimated glomerular filtration rate (eGFR) test once a year. Lipid profile (cholesterol, HDL, LDL, triglycerides)  This test should be done when you are diagnosed with diabetes, and every 5 years after the first test. If you are on medicines to lower your cholesterol, you may need to get this test done every year. ? The goal for LDL is less than 100 mg/dL (5.5 mmol/L).  If you are at high risk, the goal is less than 70 mg/dL (3.9 mmol/L). ? The goal for HDL is 40 mg/dL (2.2 mmol/L) for men and 50 mg/dL(2.8 mmol/L) for women. An HDL cholesterol of 60 mg/dL (3.3 mmol/L) or higher gives some protection against heart disease. ? The goal for triglycerides is less than 150 mg/dL (8.3 mmol/L). Immunizations  The yearly flu (influenza) vaccine is recommended for everyone 6 months or older who has diabetes.  The pneumonia (pneumococcal) vaccine is recommended for everyone 2 years or older who has diabetes. If you are 49 or older, you may get the pneumonia vaccine as a series of two separate shots.  The hepatitis B vaccine is recommended for adults shortly after they have been diagnosed with diabetes.  The Tdap (tetanus, diphtheria, and pertussis) vaccine should be given: ? According to normal childhood vaccination schedules, for children. ? Every 10 years, for adults who have diabetes.  The shingles vaccine is recommended for people who have had chicken pox and are 50 years or older. Mental and emotional health  Screening for symptoms of eating disorders, anxiety, and depression is recommended at the time of diagnosis and afterward as needed. If your screening shows that you have symptoms (you have a positive screening result), you may need further evaluation and be referred to a mental health care provider. Diabetes self-management education  Education about how to manage your diabetes is recommended at diagnosis and ongoing as needed. Treatment plan  Your treatment plan will be reviewed at every medical visit. Summary  Managing diabetes (diabetes mellitus) can be complicated. Your diabetes treatment may be managed by a team of health care providers.  Your health care providers follow a schedule in order to help you get the best quality of care.  Standards of care including having regular physical exams, blood tests, blood pressure monitoring, immunizations,  screening tests, and education about how to manage your diabetes.  Your health care providers may also give you more specific instructions based on your individual health. This information is not intended to replace advice given to you by your health care provider. Make sure you discuss any questions you have with your health care provider.  Document Released: 09/13/2009 Document Revised: 08/14/2016 Document Reviewed: 08/14/2016 Elsevier Interactive Patient Education  [employer].

## 2017-06-25 NOTE — Progress Notes (Signed)
Subjective:  By signing my name below, I, [name] [name], attest that this documentation has been prepared under the direction and in the presence of [name], MD. Electronically Signed: [name], [place]. 06/25/2017 , 3:01 PM .  Patient was seen in Room 8 .   Patient ID: [name] [name], male    DOB: May 31, 1956, 61 y.o.   MRN: [id number] Chief Complaint  Patient presents with  . Medication Refill    lantus and novolog refill   HPI [name] [name] is a 61 y.o. male who presents to Primary Care at [facility] for medication refill of lantus and novolog. Patient was last seen 10 months prior. At that time, he was taking lantus 26 units BID and novolog TID at 26 units of full high carb meal, but reduced for smaller meals. 30 units when CBG up to 400. He was behind on ophtho exam at that time. He takes nitroglycerine and Effient for CAD, but stopped his metoprolol and Crestor as he thought he didn't need them.   Patient reports his [name] sugar has been doing okay, but hasn't checked his sugar in a while due to running out of the meter. He hasn't made an appointment to see eye doctor. His feet has been doing well, but does note some swelling.   He's been taking lantus 30-34 units twice a day (usually 34 units), and 25-28 units of novolog before meals (3x a day). He is taking a baby aspirin daily. He hasn't seen his cardiologist in a while. He isn't taking lisinopril due to dizziness. He hasn't been taking his Crestor because "his cholesterol was fine when checked" (in 2016).   He received 3 months supply of Effient 5 months prior. He hasn't had pen needles prescribed in 3 years. He should have ran out of his metoprolol 1 month prior.   Left jaw pain He also complains of left jaw pain from his left ear down. He noticed his jaw popping about 2 weeks ago. He denies having an appointment with dentist.   Past Medical History:  Diagnosis Date  . Barrett's esophagus   . CAD (coronary artery disease)    a. NSTEMI 05/2014 - occluded RCA dominant proximal s/p asp-thrombectomy/DES to RCA, minimal LAD/LCx, EF 50% by cath, 65-70% by echo.  . Depression   . Diabetes type 2, uncontrolled ([place])   . Former tobacco use   . GERD (gastroesophageal reflux disease)   . Hemorrhoids    hx of  . Hypercholesterolemia   . Hypertension   . MI (myocardial infarction) ([place])   . Peripheral neuropathy    Prior to Admission medications   Medication Sig Start Date End Date Taking? Authorizing Provider  ACCU-CHEK AVIVA PLUS test strip USE ONE STRIP THREE TIMES DAILY 07/16/16  Yes Elayne Snare, MD  ACCU-CHEK SOFTCLIX LANCETS lancets Use as instructed to check [name] sugar 3 times per day dx code 250.02 07/10/14  Yes Elayne Snare, MD  buPROPion (WELLBUTRIN XL) 300 MG 24 hr tablet TAKE ONE TABLET BY MOUTH ONCE DAILY 08/30/15  Yes Shawnee Knapp, MD  EFFIENT 10 MG TABS tablet TAKE ONE TABLET BY MOUTH ONCE DAILY 01/30/17  Yes Shawnee Knapp, MD  EQ ASPIRIN ADULT LOW DOSE 81 MG EC tablet TAKE ONE TABLET BY MOUTH ONCE DAILY 07/02/15  Yes Jettie Booze, MD  gabapentin (NEURONTIN) 100 MG capsule Take Two Capsules in the Morning, Afternoon and Bedtime Patient taking differently: Take 200 mg by mouth 3 (three) times daily.  12/14/16  Yes Bayard Hugger, NP  HYDROcodone-acetaminophen (NORCO/VICODIN) 5-325 MG tablet Take 1 tablet by mouth every 6 (six) hours as needed for moderate pain (pain). 05/31/17  Yes Bayard Hugger, NP  insulin aspart (NOVOLOG FLEXPEN) 100 UNIT/ML FlexPen INJECT 30 TO 35 UNITS SUBCUTANEOUSLY THREE TIMES DAILY Patient taking differently: Inject 30-35 Units into the skin 3 (three) times daily. INJECT 30 TO 35 UNITS SUBCUTANEOUSLY THREE TIMES DAILY 08/25/16  Yes Shawnee Knapp, MD  Insulin Glargine (LANTUS SOLOSTAR) 100 UNIT/ML Solostar Pen INJECT 36 UNITS SUBCUTANEOUSLY TWICE DAILY **Need office visit for any additional refills** 06/19/17  Yes Shawnee Knapp, MD  Insulin Pen Needle (B-D UF III MINI PEN NEEDLES) 31G X 5 MM  MISC Use to inject insulin 5 pen needles per day 07/10/14  Yes Elayne Snare, MD  lisinopril (PRINIVIL,ZESTRIL) 2.5 MG tablet Take 1 tablet (2.5 mg total) by mouth daily. 08/25/16  Yes Shawnee Knapp, MD  metoprolol tartrate (LOPRESSOR) 25 MG tablet Take 0.5 tablets (12.5 mg total) by mouth 2 (two) times daily. 08/25/16  Yes Shawnee Knapp, MD  nitroGLYCERIN (NITROSTAT) 0.4 MG SL tablet Place 1 tablet (0.4 mg total) under the tongue every 5 (five) minutes as needed for chest pain (up to 3 doses). 11/10/16  Yes Jettie Booze, MD  pantoprazole (PROTONIX) 40 MG tablet Take 1 tablet (40 mg total) by mouth daily. 08/25/16  Yes Shawnee Knapp, MD  prasugrel (EFFIENT) 10 MG TABS tablet TAKE ONE TABLET BY MOUTH ONCE DAILY. MUST HAVE OFFICE VISIT BEFORE FURTHER REFILLS. 01/03/17  Yes Shawnee Knapp, MD  rosuvastatin (CRESTOR) 40 MG tablet Take 1 tablet (40 mg total) by mouth daily. 08/25/16  Yes Shawnee Knapp, MD   No Known Allergies  Review of Systems  Constitutional: Negative for fatigue and unexpected weight change.  HENT: Positive for dental problem. Negative for ear pain.   Eyes: Negative for visual disturbance.  Respiratory: Negative for cough, chest tightness and shortness of breath.   Cardiovascular: Negative for chest pain, palpitations and leg swelling.  Gastrointestinal: Negative for abdominal pain and [name] in stool.  Neurological: Negative for dizziness, light-headedness and headaches.       Objective:   Physical Exam  Constitutional: He is oriented to person, place, and time. He appears well-developed and well-nourished. No distress.  HENT:  Head: Normocephalic and atraumatic.  Right Ear: Tympanic membrane and ear canal normal.  Left Ear: Tympanic membrane and ear canal normal.  Nose: Nose normal.  Mouth/Throat: Posterior oropharyngeal edema and posterior oropharyngeal erythema present.  Erythema and edema in posterior left upper portion of the gum; tenderness worse over inner upper aspect  Eyes:  Pupils are equal, round, and reactive to light. EOM are normal.  Neck: Neck supple.  Cardiovascular: Normal rate.   Pulmonary/Chest: Effort normal. No respiratory distress.  Musculoskeletal: Normal range of motion.  Lymphadenopathy:       Head (left side): Submandibular adenopathy present.    He has cervical adenopathy (left anterior).  Neurological: He is alert and oriented to person, place, and time.  Skin: Skin is warm and dry.  Psychiatric: He has a normal mood and affect. His behavior is normal.  Nursing note and vitals reviewed.   BP 130/77 (BP Location: Right Arm, Patient Position: Sitting, Cuff Size: Normal)   Pulse 77   Temp 98.2 F (36.8 C) (Oral)   Resp 16   Ht 5' 9"  (1.753 m)   Wt 234 lb (106.1 kg)  SpO2 97%   BMI 34.56 kg/m   Results for orders placed or performed in visit on 06/25/17  POCT glycosylated hemoglobin (Hb A1C)  Result Value Ref Range   Hemoglobin A1C 8.7        Assessment & Plan:   1. Uncontrolled type 2 diabetes mellitus with hyperglycemia, with long-term current use of insulin ([place]) - refer to optho. Under suprisingly decent control considering high dose insulin, non-compliance with other meds, and not monitoring cbgs. Refer to endocrine for mngmnt due to high insulin doses.  2. Dental abscess - pcn  3. Coronary artery disease involving native coronary artery of native heart with other form of angina pectoris ([place]) - on BB, needs to restart statin and low dose acei (stopped as he c/o hypotensive sxs). Cont asa.  4. Pure hypercholesterolemia - pt still not taking crestor as he thought he didn't need it and cholesterol fine w/o- last LDL 158 when goal <70 so needs to restart (explained statin indicated even if ldl was at goal.)     Orders Placed This Encounter  Procedures  . Comprehensive metabolic panel  . Microalbumin/Creatinine Ratio, Urine  . CBC with Differential/Platelet  . Ambulatory referral to Endocrinology    Referral Priority:    Routine    Referral Type:   Consultation    Referral Reason:   Specialty Services Required    Number of Visits Requested:   1  . Ambulatory referral to Ophthalmology    Referral Priority:   Routine    Referral Type:   Consultation    Referral Reason:   Specialty Services Required    Requested Specialty:   Ophthalmology    Number of Visits Requested:   1  . POCT glycosylated hemoglobin (Hb A1C)  . HM DIABETES FOOT EXAM    Meds ordered this encounter  Medications  . prasugrel (EFFIENT) 10 MG TABS tablet    Sig: Take 1 tablet (10 mg total) by mouth daily.    Dispense:  90 tablet    Refill:  1    Please consider 90 day supplies to promote better adherence  . insulin aspart (NOVOLOG FLEXPEN) 100 UNIT/ML FlexPen    Sig: INJECT 30 TO 35 UNITS SUBCUTANEOUSLY THREE TIMES DAILY    Dispense:  30 pen    Refill:  1    Please fill a 3 month supply at a time  . Insulin Glargine (LANTUS SOLOSTAR) 100 UNIT/ML Solostar Pen    Sig: INJECT 36 UNITS SUBCUTANEOUSLY TWICE DAILY    Dispense:  20 pen    Refill:  1  . Insulin Pen Needle (B-D UF III MINI PEN NEEDLES) 31G X 5 MM MISC    Sig: Use to inject insulin 5 pen needles per day    Dispense:  150 each    Refill:  2  . lisinopril (PRINIVIL,ZESTRIL) 2.5 MG tablet    Sig: Take 1 tablet (2.5 mg total) by mouth daily.    Dispense:  90 tablet    Refill:  1  . metoprolol tartrate (LOPRESSOR) 25 MG tablet    Sig: Take 0.5 tablets (12.5 mg total) by mouth 2 (two) times daily.    Dispense:  90 tablet    Refill:  1  . pantoprazole (PROTONIX) 40 MG tablet    Sig: Take 1 tablet (40 mg total) by mouth daily.    Dispense:  90 tablet    Refill:  3  . rosuvastatin (CRESTOR) 40 MG tablet    Sig: Take 1  tablet (40 mg total) by mouth daily.    Dispense:  90 tablet    Refill:  1  . [name] glucose meter kit and supplies KIT    Sig: Dispense based on patient and insurance preference. Use four times daily as directed. ICD10 E11.65, Z79.4    Dispense:  1 each     Refill:  0    Order Specific Question:   Number of strips    Answer:   1000    Order Specific Question:   Number of lancets    Answer:   1000  . penicillin v potassium (VEETID) 500 MG tablet    Sig: Take 1 tablet (500 mg total) by mouth 4 (four) times daily.    Dispense:  56 tablet    Refill:  0    I personally performed the services described in this documentation, which was scribed in my presence. The recorded information has been reviewed and considered, and addended by me as needed.   [name], M.D.  Primary Care at [facility] [address] [place], [place] [address] [phone] phone [phone] fax  06/26/17 12:00 AM

## 2017-06-26 LAB — CBC WITH DIFFERENTIAL/PLATELET
BASOS ABS: 0 10*3/uL (ref 0.0–0.2)
Basos: 1 %
EOS (ABSOLUTE): 0.2 10*3/uL (ref 0.0–0.4)
Eos: 5 %
HEMOGLOBIN: 13.5 g/dL (ref 13.0–17.7)
Hematocrit: 41.1 % (ref 37.5–51.0)
IMMATURE GRANULOCYTES: 0 %
Immature Grans (Abs): 0 10*3/uL (ref 0.0–0.1)
LYMPHS ABS: 1.9 10*3/uL (ref 0.7–3.1)
Lymphs: 41 %
MCH: 28.7 pg (ref 26.6–33.0)
MCHC: 32.8 g/dL (ref 31.5–35.7)
MCV: 87 fL (ref 79–97)
Monocytes Absolute: 0.4 10*3/uL (ref 0.1–0.9)
Monocytes: 8 %
NEUTROS PCT: 45 %
Neutrophils Absolute: 2.1 10*3/uL (ref 1.4–7.0)
Platelets: 323 10*3/uL (ref 150–379)
RBC: 4.71 x10E6/uL (ref 4.14–5.80)
RDW: 14.7 % (ref 12.3–15.4)
WBC: 4.6 10*3/uL (ref 3.4–10.8)

## 2017-06-26 LAB — COMPREHENSIVE METABOLIC PANEL
ALT: 27 IU/L (ref 0–44)
AST: 36 IU/L (ref 0–40)
Albumin/Globulin Ratio: 1.8 (ref 1.2–2.2)
Albumin: 4.1 g/dL (ref 3.5–5.5)
Alkaline Phosphatase: 128 IU/L — ABNORMAL HIGH (ref 39–117)
BILIRUBIN TOTAL: 0.2 mg/dL (ref 0.0–1.2)
BUN / CREAT RATIO: 14 (ref 9–20)
BUN: 14 mg/dL (ref 6–24)
CALCIUM: 8.8 mg/dL (ref 8.7–10.2)
CHLORIDE: 106 mmol/L (ref 96–106)
CO2: 20 mmol/L (ref 20–29)
CREATININE: 1.03 mg/dL (ref 0.76–1.27)
GFR, EST AFRICAN AMERICAN: 91 mL/min/{1.73_m2} (ref >59)
GFR, EST NON AFRICAN AMERICAN: 79 mL/min/{1.73_m2} (ref >59)
GLUCOSE: 154 mg/dL — AB (ref 65–99)
Globulin, Total: 2.3 g/dL (ref 1.5–4.5)
Potassium: 4.5 mmol/L (ref 3.5–5.2)
Sodium: 140 mmol/L (ref 134–144)
TOTAL PROTEIN: 6.4 g/dL (ref 6.0–8.5)

## 2017-06-26 LAB — MICROALBUMIN / CREATININE URINE RATIO
Creatinine, Urine: 196.4 mg/dL
MICROALB/CREAT RATIO: 4.4 mg/g{creat} (ref 0.0–30.0)
Microalbumin, Urine: 8.6 ug/mL

## 2017-06-27 DIAGNOSIS — R69 Illness, unspecified: Secondary | ICD-10-CM | POA: Diagnosis not present

## 2017-06-28 DIAGNOSIS — R69 Illness, unspecified: Secondary | ICD-10-CM | POA: Diagnosis not present

## 2017-06-29 DIAGNOSIS — R69 Illness, unspecified: Secondary | ICD-10-CM | POA: Diagnosis not present

## 2017-07-02 ENCOUNTER — Encounter: Payer: Self-pay | Admitting: Registered Nurse

## 2017-07-02 ENCOUNTER — Encounter: Payer: Medicare HMO | Attending: Physical Medicine & Rehabilitation | Admitting: Registered Nurse

## 2017-07-02 VITALS — BP 143/84 | HR 73

## 2017-07-02 DIAGNOSIS — M25551 Pain in right hip: Secondary | ICD-10-CM

## 2017-07-02 DIAGNOSIS — G894 Chronic pain syndrome: Secondary | ICD-10-CM | POA: Diagnosis not present

## 2017-07-02 DIAGNOSIS — M7989 Other specified soft tissue disorders: Secondary | ICD-10-CM | POA: Diagnosis not present

## 2017-07-02 DIAGNOSIS — M25552 Pain in left hip: Secondary | ICD-10-CM | POA: Diagnosis not present

## 2017-07-02 DIAGNOSIS — Z79899 Other long term (current) drug therapy: Secondary | ICD-10-CM

## 2017-07-02 DIAGNOSIS — M799 Soft tissue disorder, unspecified: Secondary | ICD-10-CM | POA: Diagnosis not present

## 2017-07-02 DIAGNOSIS — G63 Polyneuropathy in diseases classified elsewhere: Secondary | ICD-10-CM | POA: Diagnosis not present

## 2017-07-02 DIAGNOSIS — Z5181 Encounter for therapeutic drug level monitoring: Secondary | ICD-10-CM

## 2017-07-02 DIAGNOSIS — M542 Cervicalgia: Secondary | ICD-10-CM

## 2017-07-02 DIAGNOSIS — E119 Type 2 diabetes mellitus without complications: Secondary | ICD-10-CM | POA: Diagnosis not present

## 2017-07-02 MED ORDER — HYDROCODONE-ACETAMINOPHEN 5-325 MG PO TABS
1.0000 | ORAL_TABLET | Freq: Four times a day (QID) | ORAL | 0 refills | Status: DC | PRN
Start: 2017-07-02 — End: 2017-08-03

## 2017-07-02 NOTE — Progress Notes (Signed)
Subjective:    Patient ID: [name] [name], male    DOB: [birth year], 61 y.o.   MRN: [id number]  HPI: [name] is a 61 year old male who returns for follow up appointment for chronic pain and medication refill. He states his pain is located in his neck, lower back and bilateral hips. He rates his pain 6.His current exercise regime is walking.  [name] last UDS was 05/31/2017 it was consistent.    Pain Inventory Average Pain 4 Pain Right Now 6 My pain is sharp, stabbing and aching  In the last 24 hours, has pain interfered with the following? General activity 3 Relation with others 4 Enjoyment of life 3 What TIME of day is your pain at its worst? night Sleep (in general) Fair  Pain is worse with: walking and inactivity Pain improves with: heat/ice, medication and injections Relief from Meds: 8  Mobility walk without assistance ability to climb steps?  yes do you drive?  yes  Function disabled: date disabled 2014  Neuro/Psych weakness numbness tingling depression  Prior Studies Any changes since last visit?  no  Physicians involved in your care Any changes since last visit?  no   Family History  Problem Relation Age of Onset  . Hypertension Mother   . Alzheimer's disease Mother   . Alcohol abuse Father   . Cancer Father        "Throat"  . Diabetes type II Brother   . Heart disease Neg Hx    Social History   Social History  . Marital status: Married    Spouse name: N/A  . Number of children: N/A  . Years of education: N/A   Occupational History  . unemployed     applying for disability   Social History Main Topics  . Smoking status: Former Smoker    Packs/day: 0.25    Years: 3.00    Quit date: 11/30/1998  . Smokeless tobacco: Never Used  . Alcohol use No  . Drug use: No  . Sexual activity: Yes    Birth control/ protection: None   Other Topics Concern  . Not on file   Social History Narrative  . No narrative on file   Past  Surgical History:  Procedure Laterality Date  . LEFT HEART CATHETERIZATION WITH CORONARY ANGIOGRAM N/A 06/14/2014   Procedure: LEFT HEART CATHETERIZATION WITH CORONARY ANGIOGRAM;  Surgeon: Corky Crafts, MD;  Location: [facility] CATH LAB;  Service: Cardiovascular;  Laterality: N/A;  . TONSILLECTOMY     Past Medical History:  Diagnosis Date  . Barrett's esophagus   . CAD (coronary artery disease)    a. NSTEMI 05/2014 - occluded RCA dominant proximal s/p asp-thrombectomy/DES to RCA, minimal LAD/LCx, EF 50% by cath, 65-70% by echo.  . Depression   . Diabetes type 2, uncontrolled ([facility])   . Former tobacco use   . GERD (gastroesophageal reflux disease)   . Hemorrhoids    hx of  . Hypercholesterolemia   . Hypertension   . MI (myocardial infarction) ([facility])   . Peripheral neuropathy    There were no vitals taken for this visit.  Opioid Risk Score:   Fall Risk Score:  `1  Depression screen PHQ 2/9  Depression screen [facility] 2/9 03/08/2017 02/08/2017 09/15/2016 08/25/2016 08/18/2016 04/23/2016 06/16/2015  Decreased Interest 0 0 0 0 0 1 0  Down, Depressed, Hopeless 0 0 0 0 0 1 0  PHQ - 2 Score 0 0 0 0 0 2 0  Altered sleeping - - - - 3 1 -  Tired, decreased energy - - - - 2 1 -  Change in appetite - - - - 2 0 -  Feeling bad or failure about yourself  - - - - 0 0 -  Trouble concentrating - - - - 1 1 -  Moving slowly or fidgety/restless - - - - 0 0 -  Suicidal thoughts - - - - 0 0 -  PHQ-9 Score - - - - 8 5 -  Difficult doing work/chores - - - - Not difficult at all - -     Review of Systems  Constitutional: Negative.   HENT: Negative.   Eyes: Negative.   Respiratory: Negative.   Cardiovascular: Negative.   Gastrointestinal: Negative.   Endocrine: Negative.   Genitourinary: Negative.   Musculoskeletal: Negative.   Skin: Negative.   Allergic/Immunologic: Negative.   Neurological: Negative.   Hematological: Negative.   Psychiatric/Behavioral: Negative.   All other systems reviewed and are  negative.      Objective:   Physical Exam  Constitutional: He is oriented to person, place, and time. He appears well-developed and well-nourished.  HENT:  Head: Normocephalic and atraumatic.  Neck: Normal range of motion. Neck supple.  Cardiovascular: Normal rate and regular rhythm.   Pulmonary/Chest: Effort normal and breath sounds normal.  Musculoskeletal:  Normal Muscle Bulk and Muscle Testing Reveals: Upper Extremities: Full ROM and Muscle Strength 5/5 Thoracic Paraspinal Tenderness: T-1-T-3 Lumbar Paraspinal Tenderness: L-4-L-5 Lower Extremities: Full ROM and Muscle Strength 5/5 Right Lower Extremity Flexion Produces Pain into Lower Extremity Laterally Arises from Table with ease Narrow Based Gait  Neurological: He is alert and oriented to person, place, and time.  Skin: Skin is warm and dry.  Psychiatric: He has a normal mood and affect.  Nursing note and vitals reviewed.         Assessment & Plan:  1.Bilateral hip pain. 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 Ortho Following: Dr. Thurston Hole regarding right hip placement. 07/02/2017 Refilled: Hydrocodone 5/325 mg One tablet every 6 hours as needed #120. We will continue the opioid monitoring program, this consists of regular clinic visits, examinations, urine drug screen, pill counts as well as use of [place] Controlled Substance Reporting System.  2. Cervicalgia: Ortho Following: Continue with HEP as tolerated. Continue to Monitor. 07/02/2017 3. Type II DM: Polyneuropathy:Continue Gabapentin: 07/02/2017   20 minutes of face to face patient care time was spent during this visit. All questions were encouraged and answered.  F/U in 1 month

## 2017-07-05 ENCOUNTER — Telehealth: Payer: Self-pay

## 2017-07-05 NOTE — Telephone Encounter (Signed)
Patient returned call from earlier today.  He left his insurance info on my VM as I was on another call. He has [employer] #[id number].  Ins telephone # is [phone].  Called and the Rep told me that the rejection on the testing strips is because they are for more than 100 per month.  I told them testing is ordered for 4xs a day, which is 120 strips. She stated that PA had to be restarted because of quantity. Finally got Approval.  [id number] and is authorized from 07/05/17-07/05/18. Patient notified and reminded to test sugar 4x day.

## 2017-07-05 NOTE — Telephone Encounter (Signed)
Patient had no medication coverage on file.  Cover my meds sent request for test strips denial on coverage benefits.  LMVM for patient to CB.

## 2017-08-03 ENCOUNTER — Encounter: Payer: Medicare HMO | Attending: Physical Medicine & Rehabilitation | Admitting: Registered Nurse

## 2017-08-03 ENCOUNTER — Encounter: Payer: Self-pay | Admitting: Registered Nurse

## 2017-08-03 VITALS — BP 135/81 | HR 97

## 2017-08-03 DIAGNOSIS — E119 Type 2 diabetes mellitus without complications: Secondary | ICD-10-CM | POA: Diagnosis not present

## 2017-08-03 DIAGNOSIS — M25551 Pain in right hip: Secondary | ICD-10-CM

## 2017-08-03 DIAGNOSIS — M542 Cervicalgia: Secondary | ICD-10-CM

## 2017-08-03 DIAGNOSIS — G894 Chronic pain syndrome: Secondary | ICD-10-CM

## 2017-08-03 DIAGNOSIS — Z79899 Other long term (current) drug therapy: Secondary | ICD-10-CM | POA: Diagnosis not present

## 2017-08-03 DIAGNOSIS — G63 Polyneuropathy in diseases classified elsewhere: Secondary | ICD-10-CM

## 2017-08-03 DIAGNOSIS — M25552 Pain in left hip: Secondary | ICD-10-CM

## 2017-08-03 DIAGNOSIS — M799 Soft tissue disorder, unspecified: Secondary | ICD-10-CM | POA: Insufficient documentation

## 2017-08-03 DIAGNOSIS — M7989 Other specified soft tissue disorders: Secondary | ICD-10-CM | POA: Insufficient documentation

## 2017-08-03 DIAGNOSIS — Z5181 Encounter for therapeutic drug level monitoring: Secondary | ICD-10-CM

## 2017-08-03 MED ORDER — HYDROCODONE-ACETAMINOPHEN 5-325 MG PO TABS: 1.0000 | ORAL_TABLET | Freq: Four times a day (QID) | ORAL | 0 refills | Status: DC | PRN

## 2017-08-03 NOTE — Progress Notes (Signed)
Subjective:    Patient ID: [name] [name], male    DOB: September 01, 1956, 61 y.o.   MRN: [id number]  HPI:  [name] [name] is a 61 year old male who returns for follow up appointment for chronic pain and medication refill. He states his pain is located in his neck, lower back and bilateral hips.He rates his pain 4.His current exercise regime is walking.  Mr. Funari last UDS was 05/31/2017 it was consistent.   Pain Inventory Average Pain 5 Pain Right Now 4 My pain is sharp and aching  In the last 24 hours, has pain interfered with the following? General activity 6 Relation with others 4 Enjoyment of life 6 What TIME of day is your pain at its worst? night Sleep (in general) NA  Pain is worse with: walking and inactivity Pain improves with: rest, heat/ice, medication and injections Relief from Meds: 6  Mobility walk without assistance how many minutes can you walk? 10 ability to climb steps?  yes do you drive?  yes  Function disabled: date disabled 2014 I need assistance with the following:  dressing  Neuro/Psych weakness numbness tingling spasms depression anxiety  Prior Studies Any changes since last visit?  no  Physicians involved in your care Any changes since last visit?  no   Family History  Problem Relation Age of Onset  . Hypertension Mother   . Alzheimer's disease Mother   . Alcohol abuse Father   . Cancer Father        "Throat"  . Diabetes type II Brother   . Heart disease Neg Hx    Social History   Social History  . Marital status: Married    Spouse name: N/A  . Number of children: N/A  . Years of education: N/A   Occupational History  . unemployed     applying for disability   Social History Main Topics  . Smoking status: Former Smoker    Packs/day: 0.25    Years: 3.00    Quit date: 11/30/1998  . Smokeless tobacco: Never Used  . Alcohol use No  . Drug use: No  . Sexual activity: Yes    Birth control/ protection: None   Other  Topics Concern  . None   Social History Narrative  . None   Past Surgical History:  Procedure Laterality Date  . LEFT HEART CATHETERIZATION WITH CORONARY ANGIOGRAM N/A 06/14/2014   Procedure: LEFT HEART CATHETERIZATION WITH CORONARY ANGIOGRAM;  Surgeon: Corky Crafts, MD;  Location: [facility] CATH LAB;  Service: Cardiovascular;  Laterality: N/A;  . TONSILLECTOMY     Past Medical History:  Diagnosis Date  . Barrett's esophagus   . CAD (coronary artery disease)    a. NSTEMI 05/2014 - occluded RCA dominant proximal s/p asp-thrombectomy/DES to RCA, minimal LAD/LCx, EF 50% by cath, 65-70% by echo.  . Depression   . Diabetes type 2, uncontrolled ([facility])   . Former tobacco use   . GERD (gastroesophageal reflux disease)   . Hemorrhoids    hx of  . Hypercholesterolemia   . Hypertension   . MI (myocardial infarction) ([facility])   . Peripheral neuropathy    BP 135/81   Pulse 97   SpO2 95%   Opioid Risk Score:  1 Fall Risk Score:  `1  Depression screen PHQ 2/9  Depression screen [facility] 2/9 03/08/2017 02/08/2017 09/15/2016 08/25/2016 08/18/2016 04/23/2016 06/16/2015  Decreased Interest 0 0 0 0 0 1 0  Down, Depressed, Hopeless 0 0 0 0 0  1 0  PHQ - 2 Score 0 0 0 0 0 2 0  Altered sleeping - - - - 3 1 -  Tired, decreased energy - - - - 2 1 -  Change in appetite - - - - 2 0 -  Feeling bad or failure about yourself  - - - - 0 0 -  Trouble concentrating - - - - 1 1 -  Moving slowly or fidgety/restless - - - - 0 0 -  Suicidal thoughts - - - - 0 0 -  PHQ-9 Score - - - - 8 5 -  Difficult doing work/chores - - - - Not difficult at all - -    Review of Systems  Constitutional: Negative.   HENT: Negative.   Eyes: Negative.   Respiratory: Negative.   Cardiovascular: Negative.   Gastrointestinal: Negative.   Endocrine: Negative.   Genitourinary: Negative.   Musculoskeletal: Negative.   Skin: Negative.   Allergic/Immunologic: Negative.   Neurological: Negative.   Hematological: Negative.     Psychiatric/Behavioral: Positive for dysphoric mood. The patient is nervous/anxious.   All other systems reviewed and are negative.      Objective:   Physical Exam  Constitutional: He is oriented to person, place, and time. He appears well-developed and well-nourished.  HENT:  Head: Normocephalic and atraumatic.  Neck: Normal range of motion. Neck supple.  Cardiovascular: Normal rate and regular rhythm.   Pulmonary/Chest: Effort normal and breath sounds normal.  Musculoskeletal:  Normal Muscle Bulk and Muscle Testing Reveals: Upper Extremities: Full ROM and Muscle Strength 5/5 Thoracic Paraspinal Tenderness: T-10-T-12 Lumbar Paraspinal Tenderness: L-4-L-5 Lower Extremities: Full ROM and Muscle Strength 5/5 Left Lower Extremity Flexion Produces Pain into Left Hip Arises from Table with ease Narrow Based gait  Neurological: He is alert and oriented to person, place, and time.  Skin: Skin is warm and dry.  Psychiatric: He has a normal mood and affect.  Nursing note and vitals reviewed.         Assessment & Plan:  1.Bilateral hip pain. 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 Ortho Following: Dr. Thurston Hole regarding right hip placement. 08/03/2017 Refilled: Hydrocodone 5/325 mg One tablet every 6 hours as needed #120. We will continue the opioid monitoring program, this consists of regular clinic visits, examinations, urine drug screen, pill counts as well as use of [place] Controlled Substance Reporting System.  2. Cervicalgia: Ortho Following: Continue with HEP as tolerated. Continue to Monitor. 08/03/2017 3. Type II DM: Polyneuropathy:Continue Gabapentin: 08/03/2017   20 minutes of face to face patient care time was spent during this visit. All questions were encouraged and answered.  F/U in 1 month

## 2017-08-06 ENCOUNTER — Telehealth: Payer: Self-pay | Admitting: Registered Nurse

## 2017-08-06 NOTE — Telephone Encounter (Signed)
On 08/06/2017 the  NCCSR was reviewed no conflict was seen on the [facility] Controlled Substance Reporting System with multiple prescribers. [name] [name] has a signed narcotic contract with our office. If there were any discrepancies this would have been reported to his physician.

## 2017-08-11 ENCOUNTER — Telehealth: Payer: Self-pay

## 2017-08-11 NOTE — Telephone Encounter (Signed)
Called pt to schedule Medicare Annual Wellness Visit. -nr  

## 2017-08-27 ENCOUNTER — Other Ambulatory Visit: Payer: Self-pay | Admitting: Family Medicine

## 2017-08-30 ENCOUNTER — Encounter: Payer: Medicare HMO | Attending: Physical Medicine & Rehabilitation | Admitting: Physical Medicine & Rehabilitation

## 2017-08-30 ENCOUNTER — Encounter: Payer: Self-pay | Admitting: Physical Medicine & Rehabilitation

## 2017-08-30 VITALS — BP 150/88 | HR 104 | Resp 14

## 2017-08-30 DIAGNOSIS — Z5181 Encounter for therapeutic drug level monitoring: Secondary | ICD-10-CM | POA: Diagnosis not present

## 2017-08-30 DIAGNOSIS — M7989 Other specified soft tissue disorders: Secondary | ICD-10-CM | POA: Diagnosis not present

## 2017-08-30 DIAGNOSIS — Z79899 Other long term (current) drug therapy: Secondary | ICD-10-CM | POA: Diagnosis not present

## 2017-08-30 DIAGNOSIS — E119 Type 2 diabetes mellitus without complications: Secondary | ICD-10-CM | POA: Insufficient documentation

## 2017-08-30 DIAGNOSIS — G894 Chronic pain syndrome: Secondary | ICD-10-CM

## 2017-08-30 DIAGNOSIS — M25552 Pain in left hip: Secondary | ICD-10-CM | POA: Diagnosis not present

## 2017-08-30 DIAGNOSIS — M799 Soft tissue disorder, unspecified: Secondary | ICD-10-CM | POA: Insufficient documentation

## 2017-08-30 DIAGNOSIS — G6289 Other specified polyneuropathies: Secondary | ICD-10-CM

## 2017-08-30 DIAGNOSIS — M25551 Pain in right hip: Secondary | ICD-10-CM | POA: Diagnosis not present

## 2017-08-30 DIAGNOSIS — G63 Polyneuropathy in diseases classified elsewhere: Secondary | ICD-10-CM

## 2017-08-30 MED ORDER — HYDROCODONE-ACETAMINOPHEN 5-325 MG PO TABS: 1.0000 | ORAL_TABLET | Freq: Four times a day (QID) | ORAL | 0 refills | Status: DC | PRN

## 2017-08-30 NOTE — Patient Instructions (Signed)
PLEASE FEEL FREE TO CALL OUR OFFICE WITH ANY PROBLEMS OR QUESTIONS ([phone])      

## 2017-08-30 NOTE — Progress Notes (Signed)
Subjective:    Patient ID: [name], male    DOB: 17-Dec-1955, 61 y.o.   MRN: [id number]  HPI  [name] [name] is here in follow up of his chronic pain. He continues to go to school which keeps him busy. He is potentially planning on hip replacement in the summer when he's done with school. He is having ongoing pain in his hips and low back. He is working on better exercise habits and more walking.   For pain he's using hydrocodone for pain which remains effective. Gabapentin for cramping/leg pain is beneficial as well.   Pain Inventory Average Pain 2 Pain Right Now 4 My pain is dull and aching  In the last 24 hours, has pain interfered with the following? General activity 3 Relation with others 3 Enjoyment of life 2 What TIME of day is your pain at its worst? night Sleep (in general) Fair  Pain is worse with: sitting and inactivity Pain improves with: rest, medication and injections Relief from Meds: 8  Mobility walk without assistance how many minutes can you walk? 10 ability to climb steps?  yes do you drive?  yes transfers alone Do you have any goals in this area?  no  Function disabled: date disabled . Do you have any goals in this area?  yes  Neuro/Psych weakness numbness  Prior Studies Any changes since last visit?  no  Physicians involved in your care Any changes since last visit?  no   Family History  Problem Relation Age of Onset  . Hypertension Mother   . Alzheimer's disease Mother   . Alcohol abuse Father   . Cancer Father        "Throat"  . Diabetes type II Brother   . Heart disease Neg Hx    Social History   Social History  . Marital status: Married    Spouse name: N/A  . Number of children: N/A  . Years of education: N/A   Occupational History  . unemployed     applying for disability   Social History Main Topics  . Smoking status: Former Smoker    Packs/day: 0.25    Years: 3.00    Quit date: 11/30/1998  . Smokeless tobacco: Never  Used  . Alcohol use No  . Drug use: No  . Sexual activity: Yes    Birth control/ protection: None   Other Topics Concern  . None   Social History Narrative  . None   Past Surgical History:  Procedure Laterality Date  . LEFT HEART CATHETERIZATION WITH CORONARY ANGIOGRAM N/A 06/14/2014   Procedure: LEFT HEART CATHETERIZATION WITH CORONARY ANGIOGRAM;  Surgeon: Corky Crafts, MD;  Location: [facility] CATH LAB;  Service: Cardiovascular;  Laterality: N/A;  . TONSILLECTOMY     Past Medical History:  Diagnosis Date  . Barrett's esophagus   . CAD (coronary artery disease)    a. NSTEMI 05/2014 - occluded RCA dominant proximal s/p asp-thrombectomy/DES to RCA, minimal LAD/LCx, EF 50% by cath, 65-70% by echo.  . Depression   . Diabetes type 2, uncontrolled (HCC)   . Former tobacco use   . GERD (gastroesophageal reflux disease)   . Hemorrhoids    hx of  . Hypercholesterolemia   . Hypertension   . MI (myocardial infarction) (HCC)   . Peripheral neuropathy    BP (!) 150/88 (BP Location: Left Arm, Patient Position: Sitting, Cuff Size: Normal)   Pulse (!) 104   Resp 14   SpO2 97%  Opioid Risk Score:   Fall Risk Score:  `1  Depression screen PHQ 2/9  Depression screen [facility] 2/9 03/08/2017 02/08/2017 09/15/2016 08/25/2016 08/18/2016 04/23/2016 06/16/2015  Decreased Interest 0 0 0 0 0 1 0  Down, Depressed, Hopeless 0 0 0 0 0 1 0  PHQ - 2 Score 0 0 0 0 0 2 0  Altered sleeping - - - - 3 1 -  Tired, decreased energy - - - - 2 1 -  Change in appetite - - - - 2 0 -  Feeling bad or failure about yourself  - - - - 0 0 -  Trouble concentrating - - - - 1 1 -  Moving slowly or fidgety/restless - - - - 0 0 -  Suicidal thoughts - - - - 0 0 -  PHQ-9 Score - - - - 8 5 -  Difficult doing work/chores - - - - Not difficult at all - -    Review of Systems  HENT: Negative.   Eyes: Negative.   Respiratory: Negative.   Cardiovascular: Negative.   Gastrointestinal: Negative.   Endocrine: Negative.     Genitourinary: Negative.   Musculoskeletal: Positive for arthralgias and neck pain.  Skin: Negative.   Allergic/Immunologic: Negative.   Neurological: Positive for weakness and numbness.  Psychiatric/Behavioral: Negative.   All other systems reviewed and are negative.      Objective:   Physical Exam  General: Alert and oriented x 3, No apparent distress HEENT: PERRL  Neck:Supple without JVD or lymphadenopathy Heart:RRR Chest:CTA B Abdomen:Soft, non-tender, non-distended, bowel sounds positive. Extremities:No clubbing, cyanosis, or edema. Pulses are 2+ Skin:Clean and intact without signs of breakdown Neuro:Pt is cognitively appropriate with normal insight, memory, and awareness. Cranial nerves 2-12 are intact. Sensory exam appears grosslynormal. Reflexes are 2+ in all 4's. Fine motor coordination is intact. No tremors. Motor function is grossly 5/5.  Musculoskeletal:hips painful with weight bearing and FABER tests   Psych:Pts behavior normal.        Assessment & Plan:  1. Bilateral hip pain. 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  2. Left medial calf cyst. History of previous inflammation.--resolved 3. Type II DM 4. Abnormal bruising    Plan: 1. Continue norco at 5/325 one q6prn #120.We will continue the opioid monitoring program, this consists of regular clinic visits, examinations, routine drug screening, pill counts as well as use of [place] Controlled Substance Reporting System. [place] today.   2. Reviewed safety with effient/ASA. 3. [name] regarding right hip replacement--He will potentially pursue hip replacement next summer after he finishes school.  4. Diet/exercise were discussed as it pertains to his sugars and BP.  5. Balance and safety stressed today. .  6. May continue low dose gabapentin 100mg  bid to qhs for cramping symptoms--this continues to be effective  15  minutesof face to face patient care time were spent during this visit. All questions were encouraged and answered. Will follow up in one month with NP.

## 2017-09-12 DIAGNOSIS — R69 Illness, unspecified: Secondary | ICD-10-CM | POA: Diagnosis not present

## 2017-09-13 DIAGNOSIS — R69 Illness, unspecified: Secondary | ICD-10-CM | POA: Diagnosis not present

## 2017-09-20 ENCOUNTER — Telehealth: Payer: Self-pay

## 2017-09-20 NOTE — Telephone Encounter (Signed)
Called pt to schedule Medicare Annual Wellness Visit. -nr  

## 2017-09-22 ENCOUNTER — Emergency Department (HOSPITAL_COMMUNITY): Payer: Medicare HMO

## 2017-09-22 ENCOUNTER — Observation Stay (HOSPITAL_COMMUNITY)
Admission: EM | Admit: 2017-09-22 | Discharge: 2017-09-24 | Disposition: A | Discharge status: Home / Self Care | Payer: Medicare HMO | Attending: Internal Medicine | Admitting: Internal Medicine

## 2017-09-22 ENCOUNTER — Ambulatory Visit: Payer: Medicare HMO

## 2017-09-22 ENCOUNTER — Encounter (HOSPITAL_COMMUNITY): Payer: Self-pay | Admitting: Nurse Practitioner

## 2017-09-22 ENCOUNTER — Observation Stay (HOSPITAL_COMMUNITY): Payer: Medicare HMO

## 2017-09-22 DIAGNOSIS — R112 Nausea with vomiting, unspecified: Secondary | ICD-10-CM | POA: Diagnosis present

## 2017-09-22 DIAGNOSIS — Z794 Long term (current) use of insulin: Secondary | ICD-10-CM | POA: Diagnosis not present

## 2017-09-22 DIAGNOSIS — Z87891 Personal history of nicotine dependence: Secondary | ICD-10-CM | POA: Diagnosis not present

## 2017-09-22 DIAGNOSIS — N179 Acute kidney failure, unspecified: Secondary | ICD-10-CM | POA: Diagnosis not present

## 2017-09-22 DIAGNOSIS — E7800 Pure hypercholesterolemia, unspecified: Secondary | ICD-10-CM | POA: Diagnosis not present

## 2017-09-22 DIAGNOSIS — N289 Disorder of kidney and ureter, unspecified: Secondary | ICD-10-CM

## 2017-09-22 DIAGNOSIS — E785 Hyperlipidemia, unspecified: Secondary | ICD-10-CM | POA: Insufficient documentation

## 2017-09-22 DIAGNOSIS — Z7902 Long term (current) use of antithrombotics/antiplatelets: Secondary | ICD-10-CM | POA: Diagnosis not present

## 2017-09-22 DIAGNOSIS — Z7982 Long term (current) use of aspirin: Secondary | ICD-10-CM | POA: Diagnosis not present

## 2017-09-22 DIAGNOSIS — R739 Hyperglycemia, unspecified: Secondary | ICD-10-CM | POA: Diagnosis present

## 2017-09-22 DIAGNOSIS — I2510 Atherosclerotic heart disease of native coronary artery without angina pectoris: Secondary | ICD-10-CM | POA: Insufficient documentation

## 2017-09-22 DIAGNOSIS — R197 Diarrhea, unspecified: Secondary | ICD-10-CM | POA: Diagnosis not present

## 2017-09-22 DIAGNOSIS — I252 Old myocardial infarction: Secondary | ICD-10-CM | POA: Insufficient documentation

## 2017-09-22 DIAGNOSIS — I10 Essential (primary) hypertension: Secondary | ICD-10-CM | POA: Diagnosis not present

## 2017-09-22 DIAGNOSIS — Z79899 Other long term (current) drug therapy: Secondary | ICD-10-CM | POA: Insufficient documentation

## 2017-09-22 DIAGNOSIS — E860 Dehydration: Secondary | ICD-10-CM | POA: Insufficient documentation

## 2017-09-22 DIAGNOSIS — K219 Gastro-esophageal reflux disease without esophagitis: Secondary | ICD-10-CM | POA: Insufficient documentation

## 2017-09-22 DIAGNOSIS — E1165 Type 2 diabetes mellitus with hyperglycemia: Secondary | ICD-10-CM | POA: Diagnosis not present

## 2017-09-22 DIAGNOSIS — N2889 Other specified disorders of kidney and ureter: Secondary | ICD-10-CM | POA: Diagnosis not present

## 2017-09-22 DIAGNOSIS — E1140 Type 2 diabetes mellitus with diabetic neuropathy, unspecified: Secondary | ICD-10-CM | POA: Diagnosis not present

## 2017-09-22 DIAGNOSIS — R109 Unspecified abdominal pain: Secondary | ICD-10-CM | POA: Diagnosis not present

## 2017-09-22 DIAGNOSIS — R000 Tachycardia, unspecified: Secondary | ICD-10-CM | POA: Diagnosis not present

## 2017-09-22 LAB — BLOOD GAS, VENOUS
ACID-BASE DEFICIT: 9.4 mmol/L — AB (ref 0.0–2.0)
Bicarbonate: 14.7 mmol/L — ABNORMAL LOW (ref 20.0–28.0)
O2 Saturation: 86.4 %
PCO2 VEN: 28.8 mmHg — AB (ref 44.0–60.0)
PH VEN: 7.329 (ref 7.250–7.430)
Patient temperature: 98.6
pO2, Ven: 57.8 mmHg — ABNORMAL HIGH (ref 32.0–45.0)

## 2017-09-22 LAB — BASIC METABOLIC PANEL
ANION GAP: 19 — AB (ref 5–15)
BUN: 29 mg/dL — ABNORMAL HIGH (ref 6–20)
CALCIUM: 9.4 mg/dL (ref 8.9–10.3)
CHLORIDE: 102 mmol/L (ref 101–111)
CO2: 15 mmol/L — AB (ref 22–32)
Creatinine, Ser: 2.08 mg/dL — ABNORMAL HIGH (ref 0.61–1.24)
GFR calc non Af Amer: 33 mL/min — ABNORMAL LOW (ref >60)
GFR, EST AFRICAN AMERICAN: 38 mL/min — AB (ref >60)
GLUCOSE: 266 mg/dL — AB (ref 65–99)
POTASSIUM: 4.1 mmol/L (ref 3.5–5.1)
Sodium: 136 mmol/L (ref 135–145)

## 2017-09-22 LAB — CBC
HEMATOCRIT: 43.7 % (ref 39.0–52.0)
HEMOGLOBIN: 15.1 g/dL (ref 13.0–17.0)
MCH: 29.5 pg (ref 26.0–34.0)
MCHC: 34.6 g/dL (ref 30.0–36.0)
MCV: 85.4 fL (ref 78.0–100.0)
Platelets: 313 10*3/uL (ref 150–400)
RBC: 5.12 MIL/uL (ref 4.22–5.81)
RDW: 13.6 % (ref 11.5–15.5)
WBC: 9.4 10*3/uL (ref 4.0–10.5)

## 2017-09-22 LAB — HEPATIC FUNCTION PANEL
ALT: 16 U/L — AB (ref 17–63)
AST: 20 U/L (ref 15–41)
Albumin: 4.5 g/dL (ref 3.5–5.0)
Alkaline Phosphatase: 133 U/L — ABNORMAL HIGH (ref 38–126)
BILIRUBIN INDIRECT: 1.4 mg/dL — AB (ref 0.3–0.9)
Bilirubin, Direct: 0.1 mg/dL (ref 0.1–0.5)
TOTAL PROTEIN: 8.1 g/dL (ref 6.5–8.1)
Total Bilirubin: 1.5 mg/dL — ABNORMAL HIGH (ref 0.3–1.2)

## 2017-09-22 LAB — URINALYSIS, ROUTINE W REFLEX MICROSCOPIC
Bilirubin Urine: NEGATIVE
Glucose, UA: >=500 mg/dL — AB
Hgb urine dipstick: NEGATIVE
Ketones, ur: 80 mg/dL — AB
Leukocytes, UA: NEGATIVE
Nitrite: NEGATIVE
PROTEIN: 100 mg/dL — AB
SPECIFIC GRAVITY, URINE: 1.02 (ref 1.005–1.030)
pH: 5 (ref 5.0–8.0)

## 2017-09-22 LAB — LIPASE, BLOOD: LIPASE: 16 U/L (ref 11–51)

## 2017-09-22 LAB — CBG MONITORING, ED
GLUCOSE-CAPILLARY: 242 mg/dL — AB (ref 65–99)
GLUCOSE-CAPILLARY: 270 mg/dL — AB (ref 65–99)

## 2017-09-22 LAB — GLUCOSE, CAPILLARY
GLUCOSE-CAPILLARY: 330 mg/dL — AB (ref 65–99)
Glucose-Capillary: 246 mg/dL — ABNORMAL HIGH (ref 65–99)

## 2017-09-22 MED ORDER — SODIUM CHLORIDE 0.9 % IV BOLUS (SEPSIS)
1000.0000 mL | Freq: Once | INTRAVENOUS | Status: AC
  Administered 2017-09-22: 1000 mL via INTRAVENOUS

## 2017-09-22 MED ORDER — METOPROLOL TARTRATE 25 MG PO TABS
12.5000 mg | ORAL_TABLET | Freq: Two times a day (BID) | ORAL | Status: DC
  Administered 2017-09-22 – 2017-09-24 (×4): 12.5 mg via ORAL
  Filled 2017-09-22 (×4): qty 1

## 2017-09-22 MED ORDER — HEPARIN SODIUM (PORCINE) 5000 UNIT/ML IJ SOLN
5000.0000 [IU] | Freq: Three times a day (TID) | INTRAMUSCULAR | Status: DC
  Administered 2017-09-22 – 2017-09-24 (×5): 5000 [IU] via SUBCUTANEOUS
  Filled 2017-09-22 (×5): qty 1

## 2017-09-22 MED ORDER — ONDANSETRON HCL 4 MG PO TABS: 4.0000 mg | ORAL_TABLET | Freq: Four times a day (QID) | ORAL | Status: DC | PRN

## 2017-09-22 MED ORDER — PRASUGREL HCL 10 MG PO TABS
10.0000 mg | ORAL_TABLET | Freq: Every day | ORAL | Status: DC
  Administered 2017-09-23 – 2017-09-24 (×2): 10 mg via ORAL
  Filled 2017-09-22 (×2): qty 1

## 2017-09-22 MED ORDER — ROSUVASTATIN CALCIUM 40 MG PO TABS
40.0000 mg | ORAL_TABLET | Freq: Every day | ORAL | Status: DC
  Administered 2017-09-22 – 2017-09-24 (×3): 40 mg via ORAL
  Filled 2017-09-22 (×2): qty 2
  Filled 2017-09-22 (×3): qty 1
  Filled 2017-09-22: qty 2

## 2017-09-22 MED ORDER — ACETAMINOPHEN 325 MG PO TABS: 650.0000 mg | ORAL_TABLET | Freq: Four times a day (QID) | ORAL | Status: DC | PRN

## 2017-09-22 MED ORDER — SODIUM CHLORIDE 0.9 % IV SOLN
INTRAVENOUS | Status: DC
  Administered 2017-09-22 – 2017-09-23 (×3): via INTRAVENOUS

## 2017-09-22 MED ORDER — PANTOPRAZOLE SODIUM 40 MG PO TBEC
40.0000 mg | DELAYED_RELEASE_TABLET | Freq: Every day | ORAL | Status: DC
  Administered 2017-09-23 – 2017-09-24 (×2): 40 mg via ORAL
  Filled 2017-09-22 (×2): qty 1

## 2017-09-22 MED ORDER — ONDANSETRON HCL 4 MG/2ML IJ SOLN
4.0000 mg | Freq: Once | INTRAMUSCULAR | Status: AC
  Administered 2017-09-22: 4 mg via INTRAVENOUS
  Filled 2017-09-22: qty 2

## 2017-09-22 MED ORDER — INSULIN ASPART 100 UNIT/ML ~~LOC~~ SOLN
0.0000 [IU] | Freq: Three times a day (TID) | SUBCUTANEOUS | Status: DC
  Administered 2017-09-23: 15 [IU] via SUBCUTANEOUS
  Administered 2017-09-23: 11 [IU] via SUBCUTANEOUS
  Administered 2017-09-24: 4 [IU] via SUBCUTANEOUS

## 2017-09-22 MED ORDER — HYOSCYAMINE SULFATE 0.125 MG/5ML PO ELIX
0.2500 mg | ORAL_SOLUTION | Freq: Four times a day (QID) | ORAL | Status: DC | PRN
Start: 2017-09-22 — End: 2017-09-24
  Administered 2017-09-23: 0.25 mg via ORAL
  Filled 2017-09-22: qty 2
  Filled 2017-09-22: qty 10

## 2017-09-22 MED ORDER — INSULIN GLARGINE 100 UNIT/ML ~~LOC~~ SOLN
36.0000 [IU] | Freq: Two times a day (BID) | SUBCUTANEOUS | Status: DC
  Administered 2017-09-22 – 2017-09-24 (×4): 36 [IU] via SUBCUTANEOUS
  Filled 2017-09-22 (×5): qty 0.36

## 2017-09-22 MED ORDER — SODIUM CHLORIDE 0.9% FLUSH
3.0000 mL | Freq: Two times a day (BID) | INTRAVENOUS | Status: DC
  Administered 2017-09-22 – 2017-09-24 (×2): 3 mL via INTRAVENOUS

## 2017-09-22 MED ORDER — NITROGLYCERIN 0.4 MG SL SUBL: 0.4000 mg | SUBLINGUAL_TABLET | SUBLINGUAL | Status: DC | PRN

## 2017-09-22 MED ORDER — ONDANSETRON HCL 4 MG/2ML IJ SOLN: 4.0000 mg | Freq: Four times a day (QID) | INTRAMUSCULAR | Status: DC | PRN

## 2017-09-22 MED ORDER — ASPIRIN EC 81 MG PO TBEC
81.0000 mg | DELAYED_RELEASE_TABLET | Freq: Every day | ORAL | Status: DC
  Administered 2017-09-22 – 2017-09-24 (×3): 81 mg via ORAL
  Filled 2017-09-22 (×3): qty 1

## 2017-09-22 MED ORDER — HYDRALAZINE HCL 20 MG/ML IJ SOLN: 10.0000 mg | Freq: Three times a day (TID) | INTRAMUSCULAR | Status: DC | PRN

## 2017-09-22 MED ORDER — HYDROCODONE-ACETAMINOPHEN 5-325 MG PO TABS
1.0000 | ORAL_TABLET | Freq: Four times a day (QID) | ORAL | Status: AC | PRN
  Administered 2017-09-22 – 2017-09-23 (×2): 2 via ORAL
  Filled 2017-09-22 (×2): qty 2

## 2017-09-22 NOTE — H&P (Signed)
[facility] History and Physical  [name] [name] BWI:[id number] DOB: [birth year] DOA: 09/22/2017  Referring physician: * PCP: Shawnee Knapp, MD   Chief Complaint: "I threw up from 7 PM to 7 AM."  HPI: [name] [name] is a 61 y.o. male with past medical history significant for Barrett's esophagus, CAD, diabetes, past myocardial infarction presents the emergency room with a chief complaint of nausea vomiting.  Patient states his had nausea vomiting for at least 3 days.  Believe he is contracted something viral.  Has passed on this illness to his wife.  Patient denies any fever.  Multiple episodes of large volume watery stool.  Several episodes of vomiting last night that prompted visit to the emergency room.  Vomitus was nonbloody nonbilious.  Denies any other sick contacts or any suspicious ingestions.  No history of abdominal surgery.   Review of Systems:  As per HPI otherwise 10 point review of systems negative.    Past Medical History:  Diagnosis Date  . Barrett's esophagus   . CAD (coronary artery disease)    a. NSTEMI 05/2014 - occluded RCA dominant proximal s/p asp-thrombectomy/DES to RCA, minimal LAD/LCx, EF 50% by cath, 65-70% by echo.  . Depression   . Diabetes type 2, uncontrolled ([place])   . Former tobacco use   . GERD (gastroesophageal reflux disease)   . Hemorrhoids    hx of  . Hypercholesterolemia   . Hypertension   . MI (myocardial infarction) ([place])   . Peripheral neuropathy    Past Surgical History:  Procedure Laterality Date  . LEFT HEART CATHETERIZATION WITH CORONARY ANGIOGRAM N/A 06/14/2014   Procedure: LEFT HEART CATHETERIZATION WITH CORONARY ANGIOGRAM;  Surgeon: Jettie Booze, MD;  Location: [facility] CATH LAB;  Service: Cardiovascular;  Laterality: N/A;  . TONSILLECTOMY     Social History:  reports that he quit smoking about 18 years ago. He has a 0.75 pack-year smoking history. He has never used smokeless tobacco. He reports that he does not drink alcohol or  use drugs.  No Known Allergies  Family History  Problem Relation Age of Onset  . Hypertension Mother   . Alzheimer's disease Mother   . Alcohol abuse Father   . Cancer Father        "Throat"  . Diabetes type II Brother   . Heart disease Neg Hx      Prior to Admission medications   Medication Sig Start Date End Date Taking? Authorizing Provider  blood glucose meter kit and supplies KIT Dispense based on patient and insurance preference. Use four times daily as directed. ICD10 E11.65, Z79.4 06/25/17  Yes Shawnee Knapp, MD  EQ ASPIRIN ADULT LOW DOSE 81 MG EC tablet TAKE ONE TABLET BY MOUTH ONCE DAILY 07/02/15  Yes Jettie Booze, MD  gabapentin (NEURONTIN) 100 MG capsule Take Two Capsules in the Morning, Afternoon and Bedtime Patient taking differently: Take 200 mg by mouth 3 (three) times daily.  12/14/16  Yes Bayard Hugger, NP  HYDROcodone-acetaminophen (NORCO/VICODIN) 5-325 MG tablet Take 1 tablet by mouth every 6 (six) hours as needed for moderate pain (pain). 08/30/17  Yes Meredith Staggers, MD  insulin aspart (NOVOLOG FLEXPEN) 100 UNIT/ML FlexPen INJECT 30 TO 35 UNITS SUBCUTANEOUSLY THREE TIMES DAILY 06/25/17  Yes Shawnee Knapp, MD  LANTUS SOLOSTAR 100 UNIT/ML Solostar Pen INJECT 36 UNITS SUBCUTANEOUSLY TWICE DAILY 08/28/17  Yes Shawnee Knapp, MD  lisinopril (PRINIVIL,ZESTRIL) 2.5 MG tablet Take 1 tablet (2.5 mg total) by mouth daily.  06/25/17  Yes Shawnee Knapp, MD  metoprolol tartrate (LOPRESSOR) 25 MG tablet Take 0.5 tablets (12.5 mg total) by mouth 2 (two) times daily. 06/25/17  Yes Shawnee Knapp, MD  nitroGLYCERIN (NITROSTAT) 0.4 MG SL tablet Place 1 tablet (0.4 mg total) under the tongue every 5 (five) minutes as needed for chest pain (up to 3 doses). 11/10/16  Yes Jettie Booze, MD  pantoprazole (PROTONIX) 40 MG tablet Take 1 tablet (40 mg total) by mouth daily. 06/25/17  Yes Shawnee Knapp, MD  Phenylephrine-DM-GG-APAP (DELSYM COUGH/COLD DAYTIME PO) Take 15 mLs by mouth daily as  needed (COUGH).   Yes [provider]  prasugrel (EFFIENT) 10 MG TABS tablet Take 1 tablet (10 mg total) by mouth daily. 06/25/17  Yes Shawnee Knapp, MD  rosuvastatin (CRESTOR) 40 MG tablet Take 1 tablet (40 mg total) by mouth daily. 06/25/17  Yes Shawnee Knapp, MD  Skin Protectants, Misc. (ABSORBASE EX) Apply 1 application topically daily as needed (MUSCLE ACHES).    Yes [provider]  Insulin Pen Needle (B-D UF III MINI PEN NEEDLES) 31G X 5 MM MISC Use to inject insulin 5 pen needles per day 06/25/17   Shawnee Knapp, MD   Physical Exam: Vitals:   09/22/17 1515 09/22/17 1530 09/22/17 1545 09/22/17 1600  BP:  119/83  120/81  Pulse: 100 (!) 106 100 (!) 102  Resp: 17 (!) 25 (!) 21 (!) 21  Temp:      TempSrc:      SpO2: 98% 96% 97% 99%  Weight:      Height:        Wt Readings from Last 3 Encounters:  09/22/17 99.8 kg (220 lb)  06/25/17 106.1 kg (234 lb)  02/10/17 104.3 kg (230 lb)    General:  Appears calm and comfortable; dehydrated Eyes:  PERRL, EOMI, normal lids, iris ENT:  grossly normal hearing, lips & tongue Neck:  no LAD, masses or thyromegaly Cardiovascular:  RRR, no m/r/g. No LE edema.  Respiratory:  CTA bilaterally, no w/r/r. Normal respiratory effort. Abdomen:  soft, ntnd Skin:  no rash or induration seen on limited exam Musculoskeletal:  grossly normal tone BUE/BLE Psychiatric:  grossly normal mood and affect, speech fluent and appropriate Neurologic:  CN 2-12 grossly intact, moves all extremities in coordinated fashion.          Labs on Admission:  Basic Metabolic Panel:  Recent Labs Lab 09/22/17 1320  NA 136  K 4.1  CL 102  CO2 15*  GLUCOSE 266*  BUN 29*  CREATININE 2.08*  CALCIUM 9.4   Liver Function Tests:  Recent Labs Lab 09/22/17 1320  AST 20  ALT 16*  ALKPHOS 133*  BILITOT 1.5*  PROT 8.1  ALBUMIN 4.5    Recent Labs Lab 09/22/17 1320  LIPASE 16   No results for input(s): AMMONIA in the last 168 hours. CBC:  Recent  Labs Lab 09/22/17 1320  WBC 9.4  HGB 15.1  HCT 43.7  MCV 85.4  PLT 313   Cardiac Enzymes: No results for input(s): CKTOTAL, CKMB, CKMBINDEX, TROPONINI in the last 168 hours.  BNP (last 3 results) No results for input(s): BNP in the last 8760 hours.  ProBNP (last 3 results) No results for input(s): PROBNP in the last 8760 hours.   Serum creatinine: 2.08 mg/dL (H) 09/22/17 1320 Estimated creatinine clearance: 44.5 mL/min (A)  CBG:  Recent Labs Lab 09/22/17 1257 09/22/17 1315  GLUCAP 270* 242*    Radiological  Exams on Admission: Dg Abd Acute W/chest  Result Date: 09/22/2017 CLINICAL DATA:  61 year old male with history of right-sided abdominal pain over the past several weeks. Severe dizziness. EXAM: DG ABDOMEN ACUTE W/ 1V CHEST COMPARISON:  Chest x-ray 04/12/2016. No prior abdominal radiographs. CT the abdomen pelvis 05/08/2014. FINDINGS: Lung volumes are normal. No consolidative airspace disease. No pleural effusions. No pneumothorax. No pulmonary nodule or mass noted. Pulmonary vasculature and the cardiomediastinal silhouette are within normal limits. Supine and left lateral decubitus views of the abdomen demonstrate a paucity of bowel gas, however, some gas and stool are noted throughout the colon and distal rectum. No pathologic dilatation of small bowel. No evidence of pneumoperitoneum. Large calcification in the low right pelvis corresponds to a phlebolith on prior CT examination. IMPRESSION: 1.  Nonobstructive bowel gas pattern. 2. No pneumoperitoneum. 3. No radiographic evidence of acute cardiopulmonary disease. Electronically Signed   By: Vinnie Langton M.D.   On: 09/22/2017 15:04    EKG: Independently reviewed. Tachycardia, no stemi, abn R wave progression  Assessment/Plan Principal Problem:   AKI (acute kidney injury) ([place]) Active Problems:   N&V (nausea and vomiting)  N/V/D Ordered KUB Likely Viranl Tylenol pr pain Zofran for nausea  AKI/CKDII Baseline  Cr 1.03, Cr on admit 2.08 2L of normal saline given in the emergency room Gentle hydration overnight Will monitor  DM  SSI  Code Status: FULL  DVT Prophylaxis: heparin Family Communication: none at bedside Disposition Plan: Pending Improvement  Status: obs tele  Elwin Mocha, MD Family Medicine [facility] [email or web address] Password [email or web address]

## 2017-09-22 NOTE — ED Notes (Signed)
ED TO INPATIENT HANDOFF REPORT  Name/Age/Gender [name] [name] 61 y.o. male  Code Status    Code Status Orders        Start     Ordered   09/22/17 1723  Full code  Continuous     09/22/17 1723    Code Status History    Date Active Date Inactive Code Status Order ID Comments User Context   04/12/2016  2:24 PM 04/14/2016  5:28 PM Full Code [id number]  Kelvin Cellar, MD Inpatient   05/30/2015  5:54 PM 05/31/2015  8:49 PM Full Code [id number]  Debbe Odea, MD Inpatient   06/14/2014  4:38 PM 06/18/2014  2:14 PM Full Code [id number]  Jettie Booze, MD Inpatient   06/14/2014 11:27 AM 06/14/2014  4:38 PM Full Code [id number]  Brett Canales, PA-C Inpatient   07/22/2013 12:19 AM 07/24/2013  5:38 PM Full Code [id number]  Rise Patience, MD Inpatient   10/01/2012  9:05 AM 10/04/2012  3:09 PM Full Code [id number]  Philis Pique, RN Inpatient   10/01/2012  5:30 AM 10/01/2012  9:05 AM Full Code [id number]  Shaune Pollack, MD ED   04/08/2012  4:40 PM 04/09/2012  4:04 PM Full Code [id number]  Lars Masson, RN Inpatient   02/28/2012 11:56 PM 03/01/2012  5:45 PM Full Code [id number]  Richrd Humbles, RN Inpatient   12/24/2011  7:07 PM 12/26/2011  2:53 PM Full Code [id number]  Nita Sells, MD ED   11/16/2011  3:19 PM 11/16/2011 10:41 PM Full Code [id number]  Delora Fuel, MD ED    Advance Directive Documentation     Most Recent Value  Type of Advance Directive  Healthcare Power of [place], Living will  Pre-existing out of facility DNR order (yellow form or pink MOST form)  -  "MOST" Form in Place?  -      Home/SNF/Other Home  Chief Complaint HIGH BLOOD SUG, DIZZY  Level of Care/Admitting Diagnosis ED Disposition    ED Disposition Condition [place]: [facility] [100102]  Level of Care: Telemetry [5]  Admit to tele based on following criteria: Monitor QTC interval  Diagnosis: AKI (acute kidney injury) [facility]) [536468]  Admitting  Physician: Elwin Mocha [[id number]]  Attending Physician: Aggie Moats, Layne Benton [[id number]]  PT Class (Do Not Modify): Observation [104]  PT Acc Code (Do Not Modify): Observation [10022]       Medical History Past Medical History:  Diagnosis Date  . Barrett's esophagus   . CAD (coronary artery disease)    a. NSTEMI 05/2014 - occluded RCA dominant proximal s/p asp-thrombectomy/DES to RCA, minimal LAD/LCx, EF 50% by cath, 65-70% by echo.  . Depression   . Diabetes type 2, uncontrolled ([place])   . Former tobacco use   . GERD (gastroesophageal reflux disease)   . Hemorrhoids    hx of  . Hypercholesterolemia   . Hypertension   . MI (myocardial infarction) ([place])   . Peripheral neuropathy     Allergies No Known Allergies  IV Location/Drains/Wounds Patient Lines/Drains/Airways Status   Active Line/Drains/Airways    Name:   Placement date:   Placement time:   Site:   Days:   Peripheral IV 09/22/17 Right Hand  09/22/17    1333    Hand    less than 1          Labs/Imaging Results for orders placed or performed during the hospital encounter of 09/22/17 (from the past  48 hour(s))  CBG monitoring, ED     Status: Abnormal   Collection Time: 09/22/17 12:57 PM  Result Value Ref Range   Glucose-Capillary 270 (H) 65 - 99 mg/dL  CBG monitoring, ED     Status: Abnormal   Collection Time: 09/22/17  1:15 PM  Result Value Ref Range   Glucose-Capillary 242 (H) 65 - 99 mg/dL  Basic metabolic panel     Status: Abnormal   Collection Time: 09/22/17  1:20 PM  Result Value Ref Range   Sodium 136 135 - 145 mmol/L   Potassium 4.1 3.5 - 5.1 mmol/L   Chloride 102 101 - 111 mmol/L   CO2 15 (L) 22 - 32 mmol/L   Glucose, Bld 266 (H) 65 - 99 mg/dL   BUN 29 (H) 6 - 20 mg/dL   Creatinine, Ser 2.08 (H) 0.61 - 1.24 mg/dL   Calcium 9.4 8.9 - 10.3 mg/dL   GFR calc non Af Amer 33 (L) >60 mL/min   GFR calc Af Amer 38 (L) >60 mL/min    Comment: (NOTE) The eGFR has been calculated using the CKD EPI  equation. This calculation has not been validated in all clinical situations. eGFR's persistently <60 mL/min signify possible Chronic Kidney Disease.    Anion gap 19 (H) 5 - 15  CBC     Status: None   Collection Time: 09/22/17  1:20 PM  Result Value Ref Range   WBC 9.4 4.0 - 10.5 K/uL   RBC 5.12 4.22 - 5.81 MIL/uL   Hemoglobin 15.1 13.0 - 17.0 g/dL   HCT 43.7 39.0 - 52.0 %   MCV 85.4 78.0 - 100.0 fL   MCH 29.5 26.0 - 34.0 pg   MCHC 34.6 30.0 - 36.0 g/dL   RDW 13.6 11.5 - 15.5 %   Platelets 313 150 - 400 K/uL  Hepatic function panel     Status: Abnormal   Collection Time: 09/22/17  1:20 PM  Result Value Ref Range   Total Protein 8.1 6.5 - 8.1 g/dL   Albumin 4.5 3.5 - 5.0 g/dL   AST 20 15 - 41 U/L   ALT 16 (L) 17 - 63 U/L   Alkaline Phosphatase 133 (H) 38 - 126 U/L   Total Bilirubin 1.5 (H) 0.3 - 1.2 mg/dL   Bilirubin, Direct 0.1 0.1 - 0.5 mg/dL   Indirect Bilirubin 1.4 (H) 0.3 - 0.9 mg/dL  Lipase, blood     Status: None   Collection Time: 09/22/17  1:20 PM  Result Value Ref Range   Lipase 16 11 - 51 U/L  Blood gas, venous     Status: Abnormal   Collection Time: 09/22/17  1:48 PM  Result Value Ref Range   pH, Ven 7.329 7.250 - 7.430   pCO2, Ven 28.8 (L) 44.0 - 60.0 mmHg   pO2, Ven 57.8 (H) 32.0 - 45.0 mmHg   Bicarbonate 14.7 (L) 20.0 - 28.0 mmol/L   Acid-base deficit 9.4 (H) 0.0 - 2.0 mmol/L   O2 Saturation 86.4 %   Patient temperature 98.6    Collection site VEIN    Drawn by COLLECTED BY LABORATORY    Sample type VEIN   Urinalysis, Routine w reflex microscopic     Status: Abnormal   Collection Time: 09/22/17  4:03 PM  Result Value Ref Range   Color, Urine YELLOW YELLOW   APPearance CLEAR CLEAR   Specific Gravity, Urine 1.020 1.005 - 1.030   pH 5.0 5.0 - 8.0   Glucose,  UA >=500 (A) NEGATIVE mg/dL   Hgb urine dipstick NEGATIVE NEGATIVE   Bilirubin Urine NEGATIVE NEGATIVE   Ketones, ur 80 (A) NEGATIVE mg/dL   Protein, ur 100 (A) NEGATIVE mg/dL   Nitrite NEGATIVE  NEGATIVE   Leukocytes, UA NEGATIVE NEGATIVE   RBC / HPF 0-5 0 - 5 RBC/hpf   WBC, UA 0-5 0 - 5 WBC/hpf   Bacteria, UA FEW (A) NONE SEEN   Squamous Epithelial / LPF 0-5 (A) NONE SEEN   Mucus PRESENT    Hyaline Casts, UA PRESENT    Dg Abd Acute W/chest  Result Date: 09/22/2017 CLINICAL DATA:  61 year old male with history of right-sided abdominal pain over the past several weeks. Severe dizziness. EXAM: DG ABDOMEN ACUTE W/ 1V CHEST COMPARISON:  Chest x-ray 04/12/2016. No prior abdominal radiographs. CT the abdomen pelvis 05/08/2014. FINDINGS: Lung volumes are normal. No consolidative airspace disease. No pleural effusions. No pneumothorax. No pulmonary nodule or mass noted. Pulmonary vasculature and the cardiomediastinal silhouette are within normal limits. Supine and left lateral decubitus views of the abdomen demonstrate a paucity of bowel gas, however, some gas and stool are noted throughout the colon and distal rectum. No pathologic dilatation of small bowel. No evidence of pneumoperitoneum. Large calcification in the low right pelvis corresponds to a phlebolith on prior CT examination. IMPRESSION: 1.  Nonobstructive bowel gas pattern. 2. No pneumoperitoneum. 3. No radiographic evidence of acute cardiopulmonary disease. Electronically Signed   By: Vinnie Langton M.D.   On: 09/22/2017 15:04    Pending Labs Unresulted Labs    Start     Ordered   09/23/17 3817  Basic metabolic panel  Tomorrow morning,   R     09/22/17 1723   09/23/17 0500  CBC  Tomorrow morning,   R     09/22/17 1723   09/22/17 1722  HIV antibody (Routine Testing)  Once,   R     09/22/17 1723   09/22/17 1722  CBC  (heparin)  Once,   R    Comments:  Baseline for heparin therapy IF NOT ALREADY DRAWN.  Notify MD if PLT < 100 K.    09/22/17 1723   09/22/17 1722  Creatinine, serum  (heparin)  Once,   R    Comments:  Baseline for heparin therapy IF NOT ALREADY DRAWN.    09/22/17 1723      Vitals/Pain Today's Vitals    09/22/17 1515 09/22/17 1530 09/22/17 1545 09/22/17 1600  BP:  119/83  120/81  Pulse: 100 (!) 106 100 (!) 102  Resp: 17 (!) 25 (!) 21 (!) 21  Temp:      TempSrc:      SpO2: 98% 96% 97% 99%  Weight:      Height:      PainSc:        Isolation Precautions No active isolations  Medications Medications  0.9 %  sodium chloride infusion (not administered)  insulin aspart (novoLOG) injection 0-20 Units (not administered)  hydrALAZINE (APRESOLINE) injection 10 mg (not administered)  Insulin Glargine (LANTUS) Solostar Pen 36 Units (not administered)  metoprolol tartrate (LOPRESSOR) tablet 12.5 mg (not administered)  pantoprazole (PROTONIX) EC tablet 40 mg (not administered)  prasugrel (EFFIENT) tablet 10 mg (not administered)  rosuvastatin (CRESTOR) tablet 40 mg (not administered)  nitroGLYCERIN (NITROSTAT) SL tablet 0.4 mg (not administered)  aspirin EC tablet 81 mg (not administered)  heparin injection 5,000 Units (not administered)  sodium chloride flush (NS) 0.9 % injection 3 mL (not administered)  ondansetron (ZOFRAN) tablet 4 mg (not administered)    Or  ondansetron (ZOFRAN) injection 4 mg (not administered)  sodium chloride 0.9 % bolus 1,000 mL (0 mLs Intravenous Stopped 09/22/17 1456)  ondansetron (ZOFRAN) injection 4 mg (4 mg Intravenous Given 09/22/17 1354)  sodium chloride 0.9 % bolus 1,000 mL (1,000 mLs Intravenous New Bag/Given 09/22/17 1628)    Mobility Ambulatory

## 2017-09-22 NOTE — Progress Notes (Signed)
On call [place] notified pt c/o 6/10 pain in abdomen. Pt currently has no pain regimen. Awaiting orders.

## 2017-09-22 NOTE — ED Provider Notes (Signed)
[facility] DEPT Provider Note   CSN: [id number] Arrival date & time: 09/22/17  1228     History   Chief Complaint Chief Complaint  Patient presents with  . Hyperglycemia  . Dizziness  . Nausea    HPI RODDRICK [name] is a 61 y.o. male.  HPI The patient presents with abdominal pain.  Acute on chronic.  Reportedly started  a month ago with a cough.  Was on cough medicine and penicillin.  Since then has had nausea and vomiting abdominal pain.  Sugars have been running around 600.  Has had urinary frequency.  His crampy abdominal pain.  Now is some diarrhea also.  States whenever he  stands up he feels like he could pass out.  No chest pain.   Past Medical History:  Diagnosis Date  . Barrett's esophagus   . CAD (coronary artery disease)    a. NSTEMI 05/2014 - occluded RCA dominant proximal s/p asp-thrombectomy/DES to RCA, minimal LAD/LCx, EF 50% by cath, 65-70% by echo.  . Depression   . Diabetes type 2, uncontrolled ([place])   . Former tobacco use   . GERD (gastroesophageal reflux disease)   . Hemorrhoids    hx of  . Hypercholesterolemia   . Hypertension   . MI (myocardial infarction) ([place])   . Peripheral neuropathy     Patient Active Problem List   Diagnosis Date Noted  . Chronic pain syndrome 08/18/2016  . Diabetic ketoacidosis ([place]) 04/12/2016  . Hip pain, bilateral 08/23/2015  . Chronic neck pain 08/23/2015  . DKA (diabetic ketoacidoses) ([place]) 05/30/2015  . Acute renal failure ([place]) 05/30/2015  . Restless legs 08/31/2014  . Uncontrolled diabetes mellitus type 2 with peripheral artery disease ([place]) 08/10/2014  . Old myocardial infarction 08/10/2014  . CAD (coronary artery disease) 06/18/2014  . Hyperlipidemia 06/18/2014  . NSTEMI (non-ST elevated myocardial infarction) ([place]) 06/14/2014  . Chronic pain in left shoulder 04/06/2013  . Peripheral neuropathy 02/28/2012  . Depression 12/25/2011  . GERD (gastroesophageal reflux disease)  12/25/2011  . DM (diabetes mellitus), type 2, uncontrolled ([place]) 11/18/2011    Past Surgical History:  Procedure Laterality Date  . LEFT HEART CATHETERIZATION WITH CORONARY ANGIOGRAM N/A 06/14/2014   Procedure: LEFT HEART CATHETERIZATION WITH CORONARY ANGIOGRAM;  Surgeon: Jettie Booze, MD;  Location[facility] CATH LAB;  Service: Cardiovascular;  Laterality: N/A;  . TONSILLECTOMY         Home Medications    Prior to Admission medications   Medication Sig Start Date End Date Taking? Authorizing Provider  blood glucose meter kit and supplies KIT Dispense based on patient and insurance preference. Use four times daily as directed. ICD10 E11.65, Z79.4 06/25/17  Yes Shawnee Knapp, MD  EQ ASPIRIN ADULT LOW DOSE 81 MG EC tablet TAKE ONE TABLET BY MOUTH ONCE DAILY 07/02/15  Yes Jettie Booze, MD  gabapentin (NEURONTIN) 100 MG capsule Take Two Capsules in the Morning, Afternoon and Bedtime Patient taking differently: Take 200 mg by mouth 3 (three) times daily.  12/14/16  Yes Bayard Hugger, NP  HYDROcodone-acetaminophen (NORCO/VICODIN) 5-325 MG tablet Take 1 tablet by mouth every 6 (six) hours as needed for moderate pain (pain). 08/30/17  Yes Meredith Staggers, MD  insulin aspart (NOVOLOG FLEXPEN) 100 UNIT/ML FlexPen INJECT 30 TO 35 UNITS SUBCUTANEOUSLY THREE TIMES DAILY 06/25/17  Yes Shawnee Knapp, MD  LANTUS SOLOSTAR 100 UNIT/ML Solostar Pen INJECT 36 UNITS SUBCUTANEOUSLY TWICE DAILY 08/28/17  Yes Shawnee Knapp, MD  lisinopril (  PRINIVIL,ZESTRIL) 2.5 MG tablet Take 1 tablet (2.5 mg total) by mouth daily. 06/25/17  Yes Shawnee Knapp, MD  metoprolol tartrate (LOPRESSOR) 25 MG tablet Take 0.5 tablets (12.5 mg total) by mouth 2 (two) times daily. 06/25/17  Yes Shawnee Knapp, MD  nitroGLYCERIN (NITROSTAT) 0.4 MG SL tablet Place 1 tablet (0.4 mg total) under the tongue every 5 (five) minutes as needed for chest pain (up to 3 doses). 11/10/16  Yes Jettie Booze, MD  pantoprazole (PROTONIX) 40 MG tablet Take  1 tablet (40 mg total) by mouth daily. 06/25/17  Yes Shawnee Knapp, MD  Phenylephrine-DM-GG-APAP (DELSYM COUGH/COLD DAYTIME PO) Take 15 mLs by mouth daily as needed (COUGH).   Yes [provider]  prasugrel (EFFIENT) 10 MG TABS tablet Take 1 tablet (10 mg total) by mouth daily. 06/25/17  Yes Shawnee Knapp, MD  rosuvastatin (CRESTOR) 40 MG tablet Take 1 tablet (40 mg total) by mouth daily. 06/25/17  Yes Shawnee Knapp, MD  Skin Protectants, Misc. (ABSORBASE EX) Apply 1 application topically daily as needed (MUSCLE ACHES).    Yes [provider]  Insulin Pen Needle (B-D UF III MINI PEN NEEDLES) 31G X 5 MM MISC Use to inject insulin 5 pen needles per day 06/25/17   Shawnee Knapp, MD    Family History Family History  Problem Relation Age of Onset  . Hypertension Mother   . Alzheimer's disease Mother   . Alcohol abuse Father   . Cancer Father        "Throat"  . Diabetes type II Brother   . Heart disease Neg Hx     Social History Social History  Substance Use Topics  . Smoking status: Former Smoker    Packs/day: 0.25    Years: 3.00    Quit date: 11/30/1998  . Smokeless tobacco: Never Used  . Alcohol use No     Allergies   Patient has no known allergies.   Review of Systems Review of Systems  Constitutional: Positive for appetite change and fatigue. Negative for fever.  HENT: Negative for congestion.   Respiratory: Negative for shortness of breath.   Cardiovascular: Negative for chest pain.  Gastrointestinal: Positive for abdominal pain, diarrhea, nausea and vomiting.  Genitourinary: Negative for flank pain.  Musculoskeletal: Negative for back pain.  Neurological: Positive for light-headedness.  Hematological: Negative for adenopathy.  Psychiatric/Behavioral: Negative for confusion.     Physical Exam Updated Vital Signs BP 119/83   Pulse 100   Temp 97.8 F (36.6 C) (Oral)   Resp (!) 21   Ht 5' 9" (1.753 m)   Wt 99.8 kg (220 lb)   SpO2 97%   BMI 32.49 kg/m    Physical Exam  Constitutional: He appears well-developed.  HENT:  Head: Normocephalic.  Eyes: Pupils are equal, round, and reactive to light.  Neck: Neck supple.  Cardiovascular:  tachycardia  Pulmonary/Chest: Effort normal.  Abdominal: Soft. There is tenderness.  Mild diffuse tenderness without rebound or guarding.   Musculoskeletal: He exhibits no edema.  Neurological: He is alert.  Skin: Skin is warm. Capillary refill takes less than 2 seconds.     ED Treatments / Results  Labs (all labs ordered are listed, but only abnormal results are displayed) Labs Reviewed  BASIC METABOLIC PANEL - Abnormal; Notable for the following:       Result Value   CO2 15 (*)    Glucose, Bld 266 (*)    BUN 29 (*)  Creatinine, Ser 2.08 (*)    GFR calc non Af Amer 33 (*)    GFR calc Af Amer 38 (*)    Anion gap 19 (*)    All other components within normal limits  HEPATIC FUNCTION PANEL - Abnormal; Notable for the following:    ALT 16 (*)    Alkaline Phosphatase 133 (*)    Total Bilirubin 1.5 (*)    Indirect Bilirubin 1.4 (*)    All other components within normal limits  BLOOD GAS, VENOUS - Abnormal; Notable for the following:    pCO2, Ven 28.8 (*)    pO2, Ven 57.8 (*)    Bicarbonate 14.7 (*)    Acid-base deficit 9.4 (*)    All other components within normal limits  CBG MONITORING, ED - Abnormal; Notable for the following:    Glucose-Capillary 242 (*)    All other components within normal limits  CBG MONITORING, ED - Abnormal; Notable for the following:    Glucose-Capillary 270 (*)    All other components within normal limits  CBC  LIPASE, BLOOD  URINALYSIS, ROUTINE W REFLEX MICROSCOPIC    EKG  EKG Interpretation  Date/Time:  Wednesday September 22 2017 13:06:23 EDT Ventricular Rate:  109 PR Interval:    QRS Duration: 73 QT Interval:  332 QTC Calculation: 447 R Axis:   45 Text Interpretation:  Sinus tachycardia Abnormal R-wave progression, early transition Baseline wander  in lead(s) V6 Confirmed by Davonna Belling [phone]) on 09/22/2017 1:34:28 PM       Radiology Dg Abd Acute W/chest  Result Date: 09/22/2017 CLINICAL DATA:  61 year old male with history of right-sided abdominal pain over the past several weeks. Severe dizziness. EXAM: DG ABDOMEN ACUTE W/ 1V CHEST COMPARISON:  Chest x-ray 04/12/2016. No prior abdominal radiographs. CT the abdomen pelvis 05/08/2014. FINDINGS: Lung volumes are normal. No consolidative airspace disease. No pleural effusions. No pneumothorax. No pulmonary nodule or mass noted. Pulmonary vasculature and the cardiomediastinal silhouette are within normal limits. Supine and left lateral decubitus views of the abdomen demonstrate a paucity of bowel gas, however, some gas and stool are noted throughout the colon and distal rectum. No pathologic dilatation of small bowel. No evidence of pneumoperitoneum. Large calcification in the low right pelvis corresponds to a phlebolith on prior CT examination. IMPRESSION: 1.  Nonobstructive bowel gas pattern. 2. No pneumoperitoneum. 3. No radiographic evidence of acute cardiopulmonary disease. Electronically Signed   By: Vinnie Langton M.D.   On: 09/22/2017 15:04    Procedures Procedures (including critical care time)  Medications Ordered in ED Medications  sodium chloride 0.9 % bolus 1,000 mL (0 mLs Intravenous Stopped 09/22/17 1456)  ondansetron (ZOFRAN) injection 4 mg (4 mg Intravenous Given 09/22/17 1354)     Initial Impression / Assessment and Plan / ED Course  I have reviewed the triage vital signs and the nursing notes.  Pertinent labs & imaging results that were available during my care of the patient were reviewed by me and considered in my medical decision making (see chart for details).     Patient with nausea vomiting and diarrhea.  Abdominal pain.  Has hyperglycemia with increased creatinine.  Has anion gap but normal pH.  Either dehydration or DKA because anion gap.  With the  worsening renal function I think he would benefit from admission.  Final Clinical Impressions(s) / ED Diagnoses   Final diagnoses:  Hyperglycemia  Dehydration  Renal insufficiency    New Prescriptions New Prescriptions   No medications on  file     Davonna Belling, MD 09/22/17 [phone]

## 2017-09-22 NOTE — ED Notes (Signed)
Patient transported to X-ray 

## 2017-09-22 NOTE — ED Notes (Signed)
Gave patient urinal to encourage urine specimen

## 2017-09-23 DIAGNOSIS — N179 Acute kidney failure, unspecified: Secondary | ICD-10-CM | POA: Diagnosis not present

## 2017-09-23 LAB — CBC
HCT: 37.9 % — ABNORMAL LOW (ref 39.0–52.0)
Hemoglobin: 12.6 g/dL — ABNORMAL LOW (ref 13.0–17.0)
MCH: 28.8 pg (ref 26.0–34.0)
MCHC: 33.2 g/dL (ref 30.0–36.0)
MCV: 86.5 fL (ref 78.0–100.0)
PLATELETS: 274 10*3/uL (ref 150–400)
RBC: 4.38 MIL/uL (ref 4.22–5.81)
RDW: 13.8 % (ref 11.5–15.5)
WBC: 9.3 10*3/uL (ref 4.0–10.5)

## 2017-09-23 LAB — BASIC METABOLIC PANEL
ANION GAP: 14 (ref 5–15)
BUN: 26 mg/dL — AB (ref 6–20)
CALCIUM: 8.8 mg/dL — AB (ref 8.9–10.3)
CO2: 14 mmol/L — ABNORMAL LOW (ref 22–32)
CREATININE: 1.53 mg/dL — AB (ref 0.61–1.24)
Chloride: 108 mmol/L (ref 101–111)
GFR calc Af Amer: 56 mL/min — ABNORMAL LOW (ref >60)
GFR, EST NON AFRICAN AMERICAN: 48 mL/min — AB (ref >60)
GLUCOSE: 372 mg/dL — AB (ref 65–99)
Potassium: 4.9 mmol/L (ref 3.5–5.1)
Sodium: 136 mmol/L (ref 135–145)

## 2017-09-23 LAB — GLUCOSE, CAPILLARY
GLUCOSE-CAPILLARY: 269 mg/dL — AB (ref 65–99)
GLUCOSE-CAPILLARY: 115 mg/dL — AB (ref 65–99)
GLUCOSE-CAPILLARY: 134 mg/dL — AB (ref 65–99)
Glucose-Capillary: 347 mg/dL — ABNORMAL HIGH (ref 65–99)

## 2017-09-23 LAB — HIV ANTIBODY (ROUTINE TESTING W REFLEX): HIV SCREEN 4TH GENERATION: NONREACTIVE

## 2017-09-23 MED ORDER — INSULIN ASPART 100 UNIT/ML ~~LOC~~ SOLN
6.0000 [IU] | Freq: Three times a day (TID) | SUBCUTANEOUS | Status: DC
  Administered 2017-09-23 – 2017-09-24 (×3): 6 [IU] via SUBCUTANEOUS

## 2017-09-23 MED ORDER — HYDROCODONE-ACETAMINOPHEN 5-325 MG PO TABS
1.0000 | ORAL_TABLET | Freq: Four times a day (QID) | ORAL | Status: DC | PRN
  Administered 2017-09-23 – 2017-09-24 (×3): 2 via ORAL
  Filled 2017-09-23 (×3): qty 2

## 2017-09-23 NOTE — Progress Notes (Signed)
Results for IVORY, THORMAN (MRN [id number]) as of 09/23/2017 13:10  Ref. Range 09/22/2017 13:15 09/22/2017 18:45 09/22/2017 20:17 09/23/2017 07:40 09/23/2017 11:18  Glucose-Capillary Latest Ref Range: 65 - 99 mg/dL 062 (H) 694 (H) 854 (H) 347 (H) 269 (H)  Noted that blood sugars continue to be elevated.  Recommend adding Novolog  6 units TID with meals if patient eats at least 50% of meal.  Noted that patient takes Novolog 30-35 units TID at home with meals. Continue Lantus 36 units BID and Novolog RESISTANT correction scale TID. May want to HS scale to regimen.  Will continue to monitor blood sugars while in the hospital.   Smith Mince RN BSN CDE Diabetes Coordinator Pager: [phone]  8am-5pm

## 2017-09-23 NOTE — Care Management Obs Status (Signed)
MEDICARE OBSERVATION STATUS NOTIFICATION   Patient Details  Name: [name] [name] MRN: [id number] Date of Birth: February 16, 1956   Medicare Observation Status Notification Given:  Yes    MahabirOlegario Messier, RN 09/23/2017, 12:22 PM

## 2017-09-23 NOTE — Care Management Note (Signed)
Case Management Note  Patient Details  Name: [name] [name] MRN: [id number] Date of Birth: [birth year]  Subjective/Objective: 62 y/o m admitted w/AKI. From home.                   Action/Plan:d/c plan home.   Expected Discharge Date:   (unknown)               Expected Discharge Plan:  Home/Self Care  In-House Referral:     Discharge planning Services  CM Consult  Post Acute Care Choice:    Choice offered to:     DME Arranged:    DME Agency:     HH Arranged:    HH Agency:     Status of Service:  In process, will continue to follow  If discussed at Long Length of Stay Meetings, dates discussed:    Additional Comments:  Lanier Clam, RN 09/23/2017, 12:23 PM

## 2017-09-23 NOTE — Progress Notes (Signed)
Patient ID: [name] [name], male   DOB: November 15, 1956, 61 y.o.   MRN: [id number]    PROGRESS NOTE  KEYONE SCHRAGE  ZSW:[id number] DOB: 1956-08-28 DOA: 09/22/2017   PCP: Sherren Mocha, MD    Brief Narrative:  Pt is 61 yo male with known CAD, DM, HTN, MI, presented to [facility] ED for evaluation of several days duration of nausea and non bloody vomiting, non bloody diarrhea.   Assessment & Plan:   Principal Problem:   AKI (acute kidney injury) (HCC) - suspect pre renal etiology  - IVF have been provided and Cr is trending down  - BMP in AM  Active Problems:   N&V (nausea and vomiting), diarrhea - suspect viral etiology - improving - advancing diet as pt clinically able to tolerate - provide antiemetics as needed     DM type II insulin dependent  - resume home medical regimen  - appreciate assistance of diabetic educator     HLD - continue statin   DVT prophylaxis: Heparin SQ Code Status: Full  Family Communication: Patient at bedside  Disposition Plan: to be determined   Consultants:   None  Procedures:   None  Antimicrobials:   None  Subjective: Pt reports feeling better.  Objective: Vitals:   09/23/17 0456 09/23/17 0500 09/23/17 0956 09/23/17 1207  BP: 124/61  (!) 112/54 115/71  Pulse: 89  78 76  Resp: 19     Temp: 98.9 F (37.2 C)   97.6 F (36.4 C)  TempSrc: Oral   Oral  SpO2: 97%   97%  Weight:  99.4 kg (219 lb 2.2 oz)    Height:        Intake/Output Summary (Last 24 hours) at 09/23/17 1315 Last data filed at 09/23/17 1300  Gross per 24 hour  Intake          2301.67 ml  Output                0 ml  Net          2301.67 ml   Filed Weights   09/22/17 1259 09/23/17 0500  Weight: 99.8 kg (220 lb) 99.4 kg (219 lb 2.2 oz)    Examination:  General exam: Appears calm and comfortable  Respiratory system: Clear to auscultation. Respiratory effort normal. Cardiovascular system: S1 & S2 heard, RRR. No JVD, murmurs, rubs, gallops or clicks. No pedal  edema. Gastrointestinal system: Abdomen is nondistended, soft and nontender. No organomegaly or masses felt. Normal bowel sounds heard. Central nervous system: Alert and oriented. No focal neurological deficits. Psychiatry: Judgement and insight appear normal. Mood & affect appropriate.    Data Reviewed: I have personally reviewed following labs and imaging studies  CBC:  Recent Labs Lab 09/22/17 1320 09/23/17 0447  WBC 9.4 9.3  HGB 15.1 12.6*  HCT 43.7 37.9*  MCV 85.4 86.5  PLT 313 274   Basic Metabolic Panel:  Recent Labs Lab 09/22/17 1320 09/23/17 0447  NA 136 136  K 4.1 4.9  CL 102 108  CO2 15* 14*  GLUCOSE 266* 372*  BUN 29* 26*  CREATININE 2.08* 1.53*  CALCIUM 9.4 8.8*   Liver Function Tests:  Recent Labs Lab 09/22/17 1320  AST 20  ALT 16*  ALKPHOS 133*  BILITOT 1.5*  PROT 8.1  ALBUMIN 4.5    Recent Labs Lab 09/22/17 1320  LIPASE 16   CBG:  Recent Labs Lab 09/22/17 1315 09/22/17 1845 09/22/17 2017 09/23/17 0740 09/23/17 1118  GLUCAP 242*  246* 330* 347* 269*   Urine analysis:    Component Value Date/Time   COLORURINE YELLOW 09/22/2017 1603   APPEARANCEUR CLEAR 09/22/2017 1603   LABSPEC 1.020 09/22/2017 1603   PHURINE 5.0 09/22/2017 1603   GLUCOSEU >=500 (A) 09/22/2017 1603   HGBUR NEGATIVE 09/22/2017 1603   BILIRUBINUR NEGATIVE 09/22/2017 1603   BILIRUBINUR small (A) 04/23/2016 1601   KETONESUR 80 (A) 09/22/2017 1603   PROTEINUR 100 (A) 09/22/2017 1603   UROBILINOGEN 0.2 04/23/2016 1601   UROBILINOGEN 0.2 06/14/2014 0809   NITRITE NEGATIVE 09/22/2017 1603   LEUKOCYTESUR NEGATIVE 09/22/2017 1603   Radiology Studies: Dg Abd Acute W/chest  Result Date: 09/22/2017 CLINICAL DATA:  61 year old male with history of right-sided abdominal pain over the past several weeks. Severe dizziness. EXAM: DG ABDOMEN ACUTE W/ 1V CHEST COMPARISON:  Chest x-ray 04/12/2016. No prior abdominal radiographs. CT the abdomen pelvis 05/08/2014. FINDINGS:  Lung volumes are normal. No consolidative airspace disease. No pleural effusions. No pneumothorax. No pulmonary nodule or mass noted. Pulmonary vasculature and the cardiomediastinal silhouette are within normal limits. Supine and left lateral decubitus views of the abdomen demonstrate a paucity of bowel gas, however, some gas and stool are noted throughout the colon and distal rectum. No pathologic dilatation of small bowel. No evidence of pneumoperitoneum. Large calcification in the low right pelvis corresponds to a phlebolith on prior CT examination. IMPRESSION: 1.  Nonobstructive bowel gas pattern. 2. No pneumoperitoneum. 3. No radiographic evidence of acute cardiopulmonary disease. Electronically Signed   By: Trudie Reedaniel  Entrikin M.D.   On: 09/22/2017 15:04    Scheduled Meds: . aspirin EC  81 mg Oral Daily  . heparin  5,000 Units Subcutaneous Q8H  . insulin aspart  0-20 Units Subcutaneous TID WC  . insulin glargine  36 Units Subcutaneous BID  . metoprolol tartrate  12.5 mg Oral BID  . pantoprazole  40 mg Oral Daily  . prasugrel  10 mg Oral Daily  . rosuvastatin  40 mg Oral Daily  . sodium chloride flush  3 mL Intravenous Q12H   Continuous Infusions: . sodium chloride 100 mL/hr at 09/23/17 0513     LOS: 0 days   Time spent: 25 minutes   Debbora PrestoIskra Magick-Riot Barrick, MD [facility] Pager [phone]  If 7PM-7AM, please contact night-coverage [email or web address] Password [email or web address] 09/23/2017, 1:15 PM

## 2017-09-24 DIAGNOSIS — N179 Acute kidney failure, unspecified: Secondary | ICD-10-CM | POA: Diagnosis not present

## 2017-09-24 LAB — GLUCOSE, CAPILLARY
GLUCOSE-CAPILLARY: 110 mg/dL — AB (ref 65–99)
GLUCOSE-CAPILLARY: 176 mg/dL — AB (ref 65–99)

## 2017-09-24 LAB — BASIC METABOLIC PANEL
Anion gap: 7 (ref 5–15)
BUN: 23 mg/dL — AB (ref 6–20)
CHLORIDE: 112 mmol/L — AB (ref 101–111)
CO2: 21 mmol/L — ABNORMAL LOW (ref 22–32)
CREATININE: 1.03 mg/dL (ref 0.61–1.24)
Calcium: 8.4 mg/dL — ABNORMAL LOW (ref 8.9–10.3)
Glucose, Bld: 153 mg/dL — ABNORMAL HIGH (ref 65–99)
POTASSIUM: 3.8 mmol/L (ref 3.5–5.1)
SODIUM: 140 mmol/L (ref 135–145)

## 2017-09-24 LAB — CBC
HCT: 36.2 % — ABNORMAL LOW (ref 39.0–52.0)
HEMOGLOBIN: 12.3 g/dL — AB (ref 13.0–17.0)
MCH: 29.1 pg (ref 26.0–34.0)
MCHC: 34 g/dL (ref 30.0–36.0)
MCV: 85.6 fL (ref 78.0–100.0)
PLATELETS: 242 10*3/uL (ref 150–400)
RBC: 4.23 MIL/uL (ref 4.22–5.81)
RDW: 13.5 % (ref 11.5–15.5)
WBC: 5 10*3/uL (ref 4.0–10.5)

## 2017-09-24 NOTE — Progress Notes (Signed)
Reviewed discharge information with patient and caregiver. Answered all questions. Patient/caregiver able to teach back medications and reasons to contact MD/911. Patient verbalizes importance of PCP follow up appointment.  Aara Jacquot M. Kalif Kattner, RN  

## 2017-09-24 NOTE — Discharge Summary (Addendum)
Physician Discharge Summary  MINOR IDEN NUU:[id number] DOB: 01-17-1956 DOA: 09/22/2017  PCP: Shawnee Knapp, MD  Admit date: 09/22/2017 Discharge date: 09/24/2017  Recommendations for Outpatient Follow-up:  1. Pt will need to follow up with PCP in 2-3 weeks post discharge 2. Please obtain BMP to evaluate electrolytes and kidney function  Discharge Diagnoses:  Principal Problem:   AKI (acute kidney injury) ([place]) Active Problems:   N&V (nausea and vomiting)  Discharge Condition: Stable  Diet recommendation: Heart healthy diet discussed in details   History of present illness:  Brief Narrative:  Pt is 61 yo male with known CAD, DM, HTN, MI, presented to [facility] ED for evaluation of several days duration of nausea and non bloody vomiting, non bloody diarrhea.   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 Severe dizziness. EXAM: DG ABDOMEN ACUTE W/ 1V CHEST COMPARISON:  Chest x-ray 04/12/2016. No prior abdominal radiographs. CT the abdomen pelvis 05/08/2014. FINDINGS: Lung volumes are normal. No consolidative airspace disease. No pleural effusions. No pneumothorax. No pulmonary nodule or mass  noted. Pulmonary vasculature and the cardiomediastinal silhouette are within normal limits. Supine and left lateral decubitus views of the abdomen demonstrate a paucity of bowel gas, however, some gas and stool are noted throughout the colon and distal rectum. No pathologic dilatation of small bowel. No evidence of pneumoperitoneum. Large calcification in the low right pelvis corresponds to a phlebolith on prior CT examination. IMPRESSION: 1.  Nonobstructive bowel gas pattern. 2. No pneumoperitoneum. 3. No radiographic evidence of acute cardiopulmonary disease. Electronically Signed   By: Vinnie Langton M.D.   On: 09/22/2017 15:04     Discharge Exam: Vitals:   09/23/17 2229 09/24/17 0444  BP: 116/66 121/75  Pulse: 71 68  Resp: 15 17  Temp: 98.9 F (37.2 C) 98.1 F (36.7 C)  SpO2: 98% 97%   Vitals:   09/23/17 1207 09/23/17 2229 09/24/17 0444 09/24/17 0500  BP: 115/71 116/66 121/75   Pulse: 76 71 68   Resp:  15 17   Temp: 97.6 F (36.4 C) 98.9 F (37.2 C) 98.1 F (36.7 C)   TempSrc: Oral Oral Oral   SpO2: 97% 98% 97%   Weight:    99.6 kg (219 lb 9.3 oz)  Height:        General: Pt is alert, follows commands appropriately, not in acute distress Cardiovascular: Regular rate and rhythm, S1/S2 +, no murmurs, no rubs, no gallops Respiratory: Clear to auscultation bilaterally, no wheezing, no crackles, no rhonchi Abdominal: Soft, non tender, non distended, bowel sounds +, no guarding Extremities: no edema, no cyanosis, pulses palpable bilaterally DP and PT Neuro: Grossly  nonfocal  Discharge Instructions  Discharge Instructions    Diet - low sodium heart healthy    Complete by:  As directed    Increase activity slowly    Complete by:  As directed      Allergies as of 09/24/2017   No Known Allergies     Medication List    TAKE these medications   ABSORBASE EX Apply 1 application topically daily as needed (MUSCLE ACHES).   blood glucose meter kit and supplies  Kit Dispense based on patient and insurance preference. Use four times daily as directed. ICD10 E11.65, Z79.4   DELSYM COUGH/COLD DAYTIME PO Take 15 mLs by mouth daily as needed (COUGH).   EQ ASPIRIN ADULT LOW DOSE 81 MG EC tablet Generic drug:  aspirin TAKE ONE TABLET BY MOUTH ONCE DAILY   gabapentin 100 MG capsule Commonly known as:  NEURONTIN Take Two Capsules in the Morning, Afternoon and Bedtime What changed:  how much to take  how to take this  when to take this  additional instructions   HYDROcodone-acetaminophen 5-325 MG tablet Commonly known as:  NORCO/VICODIN Take 1 tablet by mouth every 6 (six) hours as needed for moderate pain (pain).   insulin aspart 100 UNIT/ML FlexPen Commonly known as:  NOVOLOG FLEXPEN INJECT 30 TO 35 UNITS SUBCUTANEOUSLY THREE TIMES DAILY   Insulin Pen Needle 31G X 5 MM Misc Commonly known as:  B-D UF III MINI PEN NEEDLES Use to inject insulin 5 pen needles per day   LANTUS SOLOSTAR 100 UNIT/ML Solostar Pen Generic drug:  Insulin Glargine INJECT 36 UNITS SUBCUTANEOUSLY TWICE DAILY   lisinopril 2.5 MG tablet Commonly known as:  PRINIVIL,ZESTRIL Take 1 tablet (2.5 mg total) by mouth daily.   metoprolol tartrate 25 MG tablet Commonly known as:  LOPRESSOR Take 0.5 tablets (12.5 mg total) by mouth 2 (two) times daily.   nitroGLYCERIN 0.4 MG SL tablet Commonly known as:  NITROSTAT Place 1 tablet (0.4 mg total) under the tongue every 5 (five) minutes as needed for chest pain (up to 3 doses).   pantoprazole 40 MG tablet Commonly known as:  PROTONIX Take 1 tablet (40 mg total) by mouth daily.   prasugrel 10 MG Tabs tablet Commonly known as:  EFFIENT Take 1 tablet (10 mg total) by mouth daily.   rosuvastatin 40 MG tablet Commonly known as:  CRESTOR Take 1 tablet (40 mg total) by mouth daily.        Follow-up Information    Shawnee Knapp, MD Follow up.   Specialty:  Family Medicine Contact information: [place] [address] [phone]        Theodis Blaze, MD Follow up.   Specialty:  Internal Medicine Why:  call my cell phone with any questions [phone] Contact information: [address] [place] [address] [phone]            The results of significant diagnostics from this hospitalization (including imaging, microbiology, ancillary and laboratory) are listed below for reference.     Microbiology: No results found for this or any previous visit (from the past 240 hour(s)).   Labs: Basic Metabolic Panel:  Recent Labs Lab 09/22/17 1320 09/23/17 0447 09/24/17 0454  NA 136 136 140  K 4.1 4.9 3.8  CL 102 108 112*  CO2 15* 14* 21*  GLUCOSE 266* 372* 153*  BUN 29* 26* 23*  CREATININE 2.08* 1.53* 1.03  CALCIUM 9.4 8.8* 8.4*   Liver Function Tests:  Recent Labs Lab 09/22/17 1320  AST 20  ALT 16*  ALKPHOS 133*  BILITOT 1.5*  PROT 8.1  ALBUMIN 4.5    Recent Labs Lab 09/22/17 1320  LIPASE 16   CBC:  Recent Labs Lab 09/22/17 1320 09/23/17 0447 09/24/17 0454  WBC 9.4 9.3 5.0  HGB 15.1 12.6* 12.3*  HCT 43.7 37.9* 36.2*  MCV 85.4 86.5 85.6  PLT 313 274 242   CBG:  Recent Labs Lab 09/23/17 1118 09/23/17 1653 09/23/17 2228 09/24/17 0813 09/24/17 1201  GLUCAP 269* 115* 134* 110* 176*     SIGNED: Time coordinating discharge: 45 minutes  Faye Ramsay, MD  [facility] 09/24/2017, 12:06 PM Pager [phone]  If 7PM-7AM, please contact night-coverage [email or web address] Password [email or web address]

## 2017-09-24 NOTE — Discharge Instructions (Signed)

## 2017-09-29 ENCOUNTER — Emergency Department (HOSPITAL_COMMUNITY): Payer: Medicare HMO

## 2017-09-29 ENCOUNTER — Encounter (HOSPITAL_COMMUNITY): Payer: Self-pay | Admitting: Radiology

## 2017-09-29 ENCOUNTER — Observation Stay (HOSPITAL_COMMUNITY)
Admission: EM | Admit: 2017-09-29 | Discharge: 2017-10-01 | Disposition: A | Discharge status: Home Health | Payer: Medicare HMO | Attending: Internal Medicine | Admitting: Internal Medicine

## 2017-09-29 ENCOUNTER — Ambulatory Visit (INDEPENDENT_AMBULATORY_CARE_PROVIDER_SITE_OTHER): Payer: Medicare HMO | Admitting: Family Medicine

## 2017-09-29 ENCOUNTER — Encounter: Payer: Self-pay | Admitting: Family Medicine

## 2017-09-29 VITALS — BP 108/62 | HR 102 | Temp 98.0°F | Resp 16 | Ht 70.47 in | Wt 221.0 lb

## 2017-09-29 DIAGNOSIS — E1043 Type 1 diabetes mellitus with diabetic autonomic (poly)neuropathy: Secondary | ICD-10-CM | POA: Insufficient documentation

## 2017-09-29 DIAGNOSIS — N179 Acute kidney failure, unspecified: Secondary | ICD-10-CM | POA: Insufficient documentation

## 2017-09-29 DIAGNOSIS — K219 Gastro-esophageal reflux disease without esophagitis: Secondary | ICD-10-CM | POA: Diagnosis present

## 2017-09-29 DIAGNOSIS — E1110 Type 2 diabetes mellitus with ketoacidosis without coma: Principal | ICD-10-CM | POA: Insufficient documentation

## 2017-09-29 DIAGNOSIS — IMO0002 Reserved for concepts with insufficient information to code with codable children: Secondary | ICD-10-CM | POA: Diagnosis present

## 2017-09-29 DIAGNOSIS — Z794 Long term (current) use of insulin: Secondary | ICD-10-CM | POA: Insufficient documentation

## 2017-09-29 DIAGNOSIS — I10 Essential (primary) hypertension: Secondary | ICD-10-CM | POA: Insufficient documentation

## 2017-09-29 DIAGNOSIS — R112 Nausea with vomiting, unspecified: Secondary | ICD-10-CM

## 2017-09-29 DIAGNOSIS — E785 Hyperlipidemia, unspecified: Secondary | ICD-10-CM | POA: Insufficient documentation

## 2017-09-29 DIAGNOSIS — I2510 Atherosclerotic heart disease of native coronary artery without angina pectoris: Secondary | ICD-10-CM | POA: Diagnosis not present

## 2017-09-29 DIAGNOSIS — I252 Old myocardial infarction: Secondary | ICD-10-CM | POA: Insufficient documentation

## 2017-09-29 DIAGNOSIS — E1165 Type 2 diabetes mellitus with hyperglycemia: Secondary | ICD-10-CM

## 2017-09-29 DIAGNOSIS — E0810 Diabetes mellitus due to underlying condition with ketoacidosis without coma: Secondary | ICD-10-CM | POA: Diagnosis not present

## 2017-09-29 DIAGNOSIS — E7800 Pure hypercholesterolemia, unspecified: Secondary | ICD-10-CM | POA: Diagnosis not present

## 2017-09-29 DIAGNOSIS — I25118 Atherosclerotic heart disease of native coronary artery with other forms of angina pectoris: Secondary | ICD-10-CM | POA: Diagnosis not present

## 2017-09-29 DIAGNOSIS — Z119 Encounter for screening for infectious and parasitic diseases, unspecified: Secondary | ICD-10-CM | POA: Diagnosis not present

## 2017-09-29 DIAGNOSIS — Z79899 Other long term (current) drug therapy: Secondary | ICD-10-CM | POA: Insufficient documentation

## 2017-09-29 DIAGNOSIS — K449 Diaphragmatic hernia without obstruction or gangrene: Secondary | ICD-10-CM | POA: Diagnosis not present

## 2017-09-29 DIAGNOSIS — R109 Unspecified abdominal pain: Secondary | ICD-10-CM | POA: Diagnosis not present

## 2017-09-29 LAB — BASIC METABOLIC PANEL
ANION GAP: 17 — AB (ref 5–15)
Anion gap: 19 — ABNORMAL HIGH (ref 5–15)
Anion gap: 12 (ref 5–15)
Anion gap: 6 (ref 5–15)
BUN: 20 mg/dL (ref 6–20)
BUN: 21 mg/dL — ABNORMAL HIGH (ref 6–20)
BUN: 19 mg/dL (ref 6–20)
BUN: 14 mg/dL (ref 6–20)
CHLORIDE: 97 mmol/L — AB (ref 101–111)
CHLORIDE: 104 mmol/L (ref 101–111)
CHLORIDE: 108 mmol/L (ref 101–111)
CO2: 15 mmol/L — AB (ref 22–32)
CO2: 15 mmol/L — AB (ref 22–32)
CO2: 17 mmol/L — ABNORMAL LOW (ref 22–32)
CO2: 21 mmol/L — ABNORMAL LOW (ref 22–32)
CREATININE: 1.6 mg/dL — AB (ref 0.61–1.24)
Calcium: 8.8 mg/dL — ABNORMAL LOW (ref 8.9–10.3)
Calcium: 9 mg/dL (ref 8.9–10.3)
Calcium: 8.7 mg/dL — ABNORMAL LOW (ref 8.9–10.3)
Calcium: 8.3 mg/dL — ABNORMAL LOW (ref 8.9–10.3)
Chloride: 101 mmol/L (ref 101–111)
Creatinine, Ser: 1.64 mg/dL — ABNORMAL HIGH (ref 0.61–1.24)
Creatinine, Ser: 1.38 mg/dL — ABNORMAL HIGH (ref 0.61–1.24)
Creatinine, Ser: 1.02 mg/dL (ref 0.61–1.24)
GFR calc Af Amer: 51 mL/min — ABNORMAL LOW (ref >60)
GFR calc Af Amer: >60 mL/min (ref >60)
GFR calc Af Amer: >60 mL/min (ref >60)
GFR calc non Af Amer: 46 mL/min — ABNORMAL LOW (ref >60)
GFR calc non Af Amer: 54 mL/min — ABNORMAL LOW (ref >60)
GFR, EST AFRICAN AMERICAN: 53 mL/min — AB (ref >60)
GFR, EST NON AFRICAN AMERICAN: 44 mL/min — AB (ref >60)
GLUCOSE: 703 mg/dL — AB (ref 65–99)
GLUCOSE: 534 mg/dL — AB (ref 65–99)
GLUCOSE: 353 mg/dL — AB (ref 65–99)
GLUCOSE: 127 mg/dL — AB (ref 65–99)
POTASSIUM: 4.7 mmol/L (ref 3.5–5.1)
POTASSIUM: 4.7 mmol/L (ref 3.5–5.1)
POTASSIUM: 3.7 mmol/L (ref 3.5–5.1)
Potassium: 4.5 mmol/L (ref 3.5–5.1)
Sodium: 131 mmol/L — ABNORMAL LOW (ref 135–145)
Sodium: 133 mmol/L — ABNORMAL LOW (ref 135–145)
Sodium: 133 mmol/L — ABNORMAL LOW (ref 135–145)
Sodium: 135 mmol/L (ref 135–145)

## 2017-09-29 LAB — POCT CBC
Granulocyte percent: 60.5 %G (ref 37–80)
HCT, POC: 39.6 % — AB (ref 43.5–53.7)
Hemoglobin: 13.5 g/dL — AB (ref 14.1–18.1)
Lymph, poc: 2.2 (ref 0.6–3.4)
MCH, POC: 28.9 pg (ref 27–31.2)
MCHC: 34.2 g/dL (ref 31.8–35.4)
MCV: 84.6 fL (ref 80–97)
MID (CBC): 0.3 (ref 0–0.9)
MPV: 7.7 fL (ref 0–99.8)
PLATELET COUNT, POC: 300 10*3/uL (ref 142–424)
POC Granulocyte: 3.8 (ref 2–6.9)
POC LYMPH %: 34.5 % (ref 10–50)
POC MID %: 5 % (ref 0–12)
RBC: 4.68 M/uL — AB (ref 4.69–6.13)
RDW, POC: 13.6 %
WBC: 6.3 10*3/uL (ref 4.6–10.2)

## 2017-09-29 LAB — COMPREHENSIVE METABOLIC PANEL WITH GFR
ALT: 10 [IU]/L (ref 0–44)
AST: 13 [IU]/L (ref 0–40)
Albumin/Globulin Ratio: 1.8 (ref 1.2–2.2)
Albumin: 4.2 g/dL (ref 3.5–5.5)
Alkaline Phosphatase: 145 [IU]/L — ABNORMAL HIGH (ref 39–117)
BUN/Creatinine Ratio: 19 (ref 9–20)
BUN: 19 mg/dL (ref 6–24)
Bilirubin Total: 0.5 mg/dL (ref 0.0–1.2)
CO2: 18 mmol/L — ABNORMAL LOW (ref 20–29)
Calcium: 9.2 mg/dL (ref 8.7–10.2)
Chloride: 99 mmol/L (ref 96–106)
Creatinine, Ser: 1.01 mg/dL (ref 0.76–1.27)
GFR calc Af Amer: 94 mL/min/{1.73_m2}
GFR calc non Af Amer: 81 mL/min/{1.73_m2}
Globulin, Total: 2.4 g/dL (ref 1.5–4.5)
Glucose: 543 mg/dL (ref 65–99)
Potassium: 4.6 mmol/L (ref 3.5–5.2)
Sodium: 134 mmol/L (ref 134–144)
Total Protein: 6.6 g/dL (ref 6.0–8.5)

## 2017-09-29 LAB — POCT URINALYSIS DIP (MANUAL ENTRY)
Bilirubin, UA: NEGATIVE
LEUKOCYTES UA: NEGATIVE
NITRITE UA: NEGATIVE
PH UA: 5.5 (ref 5.0–8.0)
Protein Ur, POC: NEGATIVE mg/dL
RBC UA: NEGATIVE
Spec Grav, UA: 1.01 (ref 1.010–1.025)
UROBILINOGEN UA: 0.2 U/dL

## 2017-09-29 LAB — TROPONIN I

## 2017-09-29 LAB — CBG MONITORING, ED
GLUCOSE-CAPILLARY: 509 mg/dL — AB (ref 65–99)
Glucose-Capillary: 372 mg/dL — ABNORMAL HIGH (ref 65–99)

## 2017-09-29 LAB — MRSA PCR SCREENING: MRSA BY PCR: NEGATIVE

## 2017-09-29 LAB — I-STAT VENOUS BLOOD GAS, ED
Acid-base deficit: 11 mmol/L — ABNORMAL HIGH (ref 0.0–2.0)
BICARBONATE: 14.5 mmol/L — AB (ref 20.0–28.0)
O2 SAT: 66 %
PCO2 VEN: 30.7 mmHg — AB (ref 44.0–60.0)
PO2 VEN: 38 mmHg (ref 32.0–45.0)
TCO2: 15 mmol/L — AB (ref 22–32)
pH, Ven: 7.282 (ref 7.250–7.430)

## 2017-09-29 LAB — HEPATIC FUNCTION PANEL
ALBUMIN: 4 g/dL (ref 3.5–5.0)
ALK PHOS: 137 U/L — AB (ref 38–126)
ALT: 14 U/L — AB (ref 17–63)
AST: 19 U/L (ref 15–41)
BILIRUBIN TOTAL: 1.4 mg/dL — AB (ref 0.3–1.2)
Bilirubin, Direct: 0.2 mg/dL (ref 0.1–0.5)
Indirect Bilirubin: 1.2 mg/dL — ABNORMAL HIGH (ref 0.3–0.9)
Total Protein: 6.9 g/dL (ref 6.5–8.1)

## 2017-09-29 LAB — CBC
HCT: 40.6 % (ref 39.0–52.0)
Hemoglobin: 13.6 g/dL (ref 13.0–17.0)
MCH: 29 pg (ref 26.0–34.0)
MCHC: 33.5 g/dL (ref 30.0–36.0)
MCV: 86.6 fL (ref 78.0–100.0)
Platelets: 294 10*3/uL (ref 150–400)
RBC: 4.69 MIL/uL (ref 4.22–5.81)
RDW: 13.4 % (ref 11.5–15.5)
WBC: 8.4 10*3/uL (ref 4.0–10.5)

## 2017-09-29 LAB — URINALYSIS, ROUTINE W REFLEX MICROSCOPIC
Bacteria, UA: NONE SEEN
Bilirubin Urine: NEGATIVE
Glucose, UA: >=500 mg/dL — AB
Hgb urine dipstick: NEGATIVE
Ketones, ur: 80 mg/dL — AB
Leukocytes, UA: NEGATIVE
Nitrite: NEGATIVE
Protein, ur: NEGATIVE mg/dL
Specific Gravity, Urine: 1.026 (ref 1.005–1.030)
Squamous Epithelial / HPF: NONE SEEN
WBC, UA: NONE SEEN WBC/hpf (ref 0–5)
pH: 5 (ref 5.0–8.0)

## 2017-09-29 LAB — GLUCOSE, CAPILLARY
GLUCOSE-CAPILLARY: 116 mg/dL — AB (ref 65–99)
Glucose-Capillary: 264 mg/dL — ABNORMAL HIGH (ref 65–99)
Glucose-Capillary: 194 mg/dL — ABNORMAL HIGH (ref 65–99)
Glucose-Capillary: 155 mg/dL — ABNORMAL HIGH (ref 65–99)

## 2017-09-29 LAB — GLUCOSE, POCT (MANUAL RESULT ENTRY)

## 2017-09-29 LAB — I-STAT TROPONIN, ED: Troponin i, poc: 0 ng/mL (ref 0.00–0.08)

## 2017-09-29 LAB — POCT GLYCOSYLATED HEMOGLOBIN (HGB A1C): Hemoglobin A1C: 8.5

## 2017-09-29 LAB — LIPASE, BLOOD: Lipase: 21 U/L (ref 11–51)

## 2017-09-29 MED ORDER — LACTATED RINGERS IV BOLUS (SEPSIS)
1000.0000 mL | Freq: Once | INTRAVENOUS | Status: AC
  Administered 2017-09-29: 1000 mL via INTRAVENOUS

## 2017-09-29 MED ORDER — ENOXAPARIN SODIUM 40 MG/0.4ML ~~LOC~~ SOLN
40.0000 mg | SUBCUTANEOUS | Status: DC
  Administered 2017-09-29 – 2017-09-30 (×2): 40 mg via SUBCUTANEOUS
  Filled 2017-09-29 (×2): qty 0.4

## 2017-09-29 MED ORDER — DEXTROSE-NACL 5-0.45 % IV SOLN
INTRAVENOUS | Status: DC
  Administered 2017-09-29: 21:00:00 via INTRAVENOUS

## 2017-09-29 MED ORDER — PRASUGREL HCL 10 MG PO TABS
10.0000 mg | ORAL_TABLET | Freq: Every day | ORAL | Status: DC
  Administered 2017-09-30 – 2017-10-01 (×2): 10 mg via ORAL
  Filled 2017-09-29 (×2): qty 1

## 2017-09-29 MED ORDER — POTASSIUM CHLORIDE 10 MEQ/100ML IV SOLN
10.0000 meq | INTRAVENOUS | Status: AC
  Administered 2017-09-29 (×2): 10 meq via INTRAVENOUS
  Filled 2017-09-29 (×2): qty 100

## 2017-09-29 MED ORDER — SODIUM CHLORIDE 0.9 % IV SOLN
INTRAVENOUS | Status: DC
  Administered 2017-09-29: 4.5 [IU]/h via INTRAVENOUS
  Filled 2017-09-29: qty 1

## 2017-09-29 MED ORDER — GABAPENTIN 100 MG PO CAPS
200.0000 mg | ORAL_CAPSULE | Freq: Three times a day (TID) | ORAL | Status: DC
  Administered 2017-09-29 – 2017-09-30 (×2): 200 mg via ORAL
  Filled 2017-09-29 (×4): qty 2

## 2017-09-29 MED ORDER — METOPROLOL TARTRATE 12.5 MG HALF TABLET
12.5000 mg | ORAL_TABLET | Freq: Two times a day (BID) | ORAL | Status: DC
  Administered 2017-09-29 – 2017-10-01 (×4): 12.5 mg via ORAL
  Filled 2017-09-29 (×4): qty 1

## 2017-09-29 MED ORDER — SODIUM CHLORIDE 0.9 % IV SOLN
INTRAVENOUS | Status: AC
  Administered 2017-09-29: 19:00:00 via INTRAVENOUS

## 2017-09-29 MED ORDER — SODIUM CHLORIDE 0.9 % IV SOLN
INTRAVENOUS | Status: DC
  Administered 2017-09-29: 20:00:00 via INTRAVENOUS

## 2017-09-29 MED ORDER — METOCLOPRAMIDE HCL 5 MG/ML IJ SOLN
10.0000 mg | Freq: Once | INTRAMUSCULAR | Status: DC
Start: 2017-09-29 — End: 2017-10-01
  Filled 2017-09-29: qty 2

## 2017-09-29 MED ORDER — SODIUM CHLORIDE 0.9 % IV SOLN
INTRAVENOUS | Status: DC
  Administered 2017-09-29: 17:00:00 via INTRAVENOUS

## 2017-09-29 MED ORDER — HYDROCODONE-ACETAMINOPHEN 5-325 MG PO TABS
1.0000 | ORAL_TABLET | Freq: Four times a day (QID) | ORAL | Status: DC | PRN
  Administered 2017-09-29 – 2017-10-01 (×5): 1 via ORAL
  Filled 2017-09-29 (×5): qty 1

## 2017-09-29 MED ORDER — DEXTROSE-NACL 5-0.45 % IV SOLN: INTRAVENOUS | Status: DC

## 2017-09-29 MED ORDER — SODIUM CHLORIDE 0.9 % IV BOLUS (SEPSIS)
1000.0000 mL | Freq: Once | INTRAVENOUS | Status: AC
  Administered 2017-09-29: 1000 mL via INTRAVENOUS

## 2017-09-29 MED ORDER — PANTOPRAZOLE SODIUM 40 MG PO TBEC
40.0000 mg | DELAYED_RELEASE_TABLET | Freq: Every day | ORAL | Status: DC
  Administered 2017-09-30 – 2017-10-01 (×2): 40 mg via ORAL
  Filled 2017-09-29 (×2): qty 1

## 2017-09-29 MED ORDER — IOPAMIDOL (ISOVUE-300) INJECTION 61%
INTRAVENOUS | Status: AC
  Administered 2017-09-29: 100 mL
  Filled 2017-09-29: qty 100

## 2017-09-29 MED ORDER — ROSUVASTATIN CALCIUM 20 MG PO TABS
40.0000 mg | ORAL_TABLET | Freq: Every day | ORAL | Status: DC
Start: 2017-09-29 — End: 2017-10-01
  Administered 2017-09-29 – 2017-10-01 (×3): 40 mg via ORAL
  Filled 2017-09-29 (×3): qty 2

## 2017-09-29 NOTE — ED Notes (Signed)
Called lab to addon BMP. 

## 2017-09-29 NOTE — ED Notes (Signed)
I-stat venous blood gas given to Dr. Rhunette Croft

## 2017-09-29 NOTE — ED Notes (Signed)
Pt reports thirst, frequent urination and the feeling of dehydration. He was sent here from his primary.

## 2017-09-29 NOTE — ED Provider Notes (Signed)
[facility] EMERGENCY DEPARTMENT Provider Note   CSN: [id number] Arrival date & time: 09/29/17  1223     History   Chief Complaint Chief Complaint  Patient presents with  . Hyperglycemia    HPI [name] [name] is a 61 y.o. male with history of type 2 diabetes, CAD with stent, hypertension who presents from his PCP with hyperglycemia.  He has had associated generalized fatigue, intermittent lightheadedness, nausea, and abdominal pain.  Patient was admitted last week at [facility] for dehydration and AKI after having a viral illness.  His URI symptoms have resolved, however patient continues to feel fatigued and very dehydrated.  He has had a very decreased appetite.  Patient never returned to baseline after discharge from the hospital.  He went to his PCP today for follow-up and he was sent here because his blood glucose was greater than 600 and had ketones in his urine.  Patient also reports he has had some new lower back pain.  It is intermittent.  It comes on suddenly and lasts for "a little while "and then goes away.  Patient denies any chest pain or shortness of breath.  He reports he has had some lower abdominal pain that feels typical of his past gastroparesis.  He denies any urinary symptoms.  He has had some diarrhea that is nonbloody.  He has been taking his insulin as prescribed at home.  HPI  Past Medical History:  Diagnosis Date  . Barrett's esophagus   . CAD (coronary artery disease)    a. NSTEMI 05/2014 - occluded RCA dominant proximal s/p asp-thrombectomy/DES to RCA, minimal LAD/LCx, EF 50% by cath, 65-70% by echo.  . Depression   . Diabetes type 2, uncontrolled ([place])   . Former tobacco use   . GERD (gastroesophageal reflux disease)   . Hemorrhoids    hx of  . Hypercholesterolemia   . Hypertension   . MI (myocardial infarction) ([place])   . Peripheral neuropathy     Patient Active Problem List   Diagnosis Date Noted  . AKI (acute kidney injury)  ([place]) 09/22/2017  . Chronic pain syndrome 08/18/2016  . Diabetic ketoacidosis ([place]) 04/12/2016  . Hip pain, bilateral 08/23/2015  . Chronic neck pain 08/23/2015  . DKA (diabetic ketoacidoses) ([place]) 05/30/2015  . Acute renal failure ([place]) 05/30/2015  . Restless legs 08/31/2014  . Uncontrolled diabetes mellitus type 2 with peripheral artery disease ([place]) 08/10/2014  . Old myocardial infarction 08/10/2014  . CAD (coronary artery disease) 06/18/2014  . Hyperlipidemia 06/18/2014  . NSTEMI (non-ST elevated myocardial infarction) ([place]) 06/14/2014  . N&V (nausea and vomiting) 07/21/2013  . Chronic pain in left shoulder 04/06/2013  . Peripheral neuropathy 02/28/2012  . Depression 12/25/2011  . GERD (gastroesophageal reflux disease) 12/25/2011  . DM (diabetes mellitus), type 2, uncontrolled ([place]) 11/18/2011    Past Surgical History:  Procedure Laterality Date  . LEFT HEART CATHETERIZATION WITH CORONARY ANGIOGRAM N/A 06/14/2014   Procedure: LEFT HEART CATHETERIZATION WITH CORONARY ANGIOGRAM;  Surgeon: Jettie Booze, MD;  Location: [facility] CATH LAB;  Service: Cardiovascular;  Laterality: N/A;  . TONSILLECTOMY         Home Medications    Prior to Admission medications   Medication Sig Start Date End Date Taking? Authorizing Provider  blood glucose meter kit and supplies KIT Dispense based on patient and insurance preference. Use four times daily as directed. ICD10 E11.65, Z79.4 06/25/17  Yes Shawnee Knapp, MD  EQ ASPIRIN ADULT LOW DOSE 81 MG  EC tablet TAKE ONE TABLET BY MOUTH ONCE DAILY 07/02/15  Yes Jettie Booze, MD  gabapentin (NEURONTIN) 100 MG capsule Take Two Capsules in the Morning, Afternoon and Bedtime Patient taking differently: Take 200 mg by mouth 3 (three) times daily.  12/14/16  Yes Bayard Hugger, NP  HYDROcodone-acetaminophen (NORCO/VICODIN) 5-325 MG tablet Take 1 tablet by mouth every 6 (six) hours as needed for moderate pain (pain). 08/30/17  Yes Meredith Staggers, MD    insulin aspart (NOVOLOG FLEXPEN) 100 UNIT/ML FlexPen INJECT 30 TO 35 UNITS SUBCUTANEOUSLY THREE TIMES DAILY Patient taking differently: Inject 30-35 Units into the skin 3 (three) times daily. INJECT 30 TO 35 UNITS SUBCUTANEOUSLY THREE TIMES DAILY 06/25/17  Yes Shawnee Knapp, MD  Insulin Pen Needle (B-D UF III MINI PEN NEEDLES) 31G X 5 MM MISC Use to inject insulin 5 pen needles per day 06/25/17  Yes Shawnee Knapp, MD  LANTUS SOLOSTAR 100 UNIT/ML Solostar Pen Inject 34 Units into the skin 2 (two) times daily. 08/28/17  Yes [provider]  lisinopril (PRINIVIL,ZESTRIL) 2.5 MG tablet Take 1 tablet (2.5 mg total) by mouth daily. 06/25/17  Yes Shawnee Knapp, MD  metoprolol tartrate (LOPRESSOR) 25 MG tablet Take 0.5 tablets (12.5 mg total) by mouth 2 (two) times daily. 06/25/17  Yes Shawnee Knapp, MD  nitroGLYCERIN (NITROSTAT) 0.4 MG SL tablet Place 1 tablet (0.4 mg total) under the tongue every 5 (five) minutes as needed for chest pain (up to 3 doses). 11/10/16  Yes Jettie Booze, MD  pantoprazole (PROTONIX) 40 MG tablet Take 1 tablet (40 mg total) by mouth daily. 06/25/17  Yes Shawnee Knapp, MD  Phenylephrine-DM-GG-APAP (DELSYM COUGH/COLD DAYTIME PO) Take 15 mLs by mouth daily as needed (COUGH).   Yes [provider]  prasugrel (EFFIENT) 10 MG TABS tablet Take 1 tablet (10 mg total) by mouth daily. 06/25/17  Yes Shawnee Knapp, MD  rosuvastatin (CRESTOR) 40 MG tablet Take 1 tablet (40 mg total) by mouth daily. 06/25/17  Yes Shawnee Knapp, MD  Skin Protectants, Misc. (ABSORBASE EX) Apply 1 application topically daily as needed (MUSCLE ACHES).    Yes [provider]    Family History Family History  Problem Relation Age of Onset  . Hypertension Mother   . Alzheimer's disease Mother   . Alcohol abuse Father   . Cancer Father        "Throat"  . Diabetes type II Brother   . Heart disease Neg Hx     Social History Social History  Substance Use Topics  . Smoking status: Former Smoker     Packs/day: 0.25    Years: 3.00    Quit date: 11/30/1998  . Smokeless tobacco: Never Used  . Alcohol use No     Allergies   Patient has no known allergies.   Review of Systems Review of Systems  Constitutional: Positive for appetite change and fatigue. Negative for chills and fever.  HENT: Negative for facial swelling and sore throat.   Respiratory: Negative for shortness of breath.   Cardiovascular: Negative for chest pain.  Gastrointestinal: Positive for abdominal pain, diarrhea and nausea. Negative for vomiting.  Genitourinary: Negative for dysuria.  Musculoskeletal: Positive for back pain.  Skin: Negative for rash and wound.  Neurological: Positive for light-headedness. Negative for headaches.  Psychiatric/Behavioral: The patient is not nervous/anxious.      Physical Exam Updated Vital Signs BP 128/68   Pulse 99   Temp 98 F (36.7  C) (Oral)   Resp (!) 23   SpO2 97%   Physical Exam  Constitutional: He appears well-developed and well-nourished. No distress.  HENT:  Head: Normocephalic and atraumatic.  Mouth/Throat: Oropharynx is clear and moist. Mucous membranes are dry. No oropharyngeal exudate.  Eyes: Pupils are equal, round, and reactive to light. Conjunctivae are normal. Right eye exhibits no discharge. Left eye exhibits no discharge. No scleral icterus.  Neck: Normal range of motion. Neck supple. No thyromegaly present.  Cardiovascular: Normal rate, regular rhythm, normal heart sounds and intact distal pulses.  Exam reveals no gallop and no friction rub.   No murmur heard. Pulmonary/Chest: Effort normal and breath sounds normal. No stridor. No respiratory distress. He has no wheezes. He has no rales.  Abdominal: Soft. Bowel sounds are normal. He exhibits no distension. There is tenderness in the right lower quadrant, epigastric area and left lower quadrant. There is no rebound and no guarding.  Musculoskeletal: He exhibits no edema.  Back pain not reproducible  on palpation  Lymphadenopathy:    He has no cervical adenopathy.  Neurological: He is alert. Coordination normal.  Skin: Skin is warm and dry. No rash noted. He is not diaphoretic. No pallor.  Psychiatric: He has a normal mood and affect.  Nursing note and vitals reviewed.    ED Treatments / Results  Labs (all labs ordered are listed, but only abnormal results are displayed) Labs Reviewed  BASIC METABOLIC PANEL - Abnormal; Notable for the following:       Result Value   Sodium 131 (*)    Chloride 97 (*)    CO2 15 (*)    Glucose, Bld 703 (*)    Creatinine, Ser 1.60 (*)    Calcium 8.8 (*)    GFR calc non Af Amer 46 (*)    GFR calc Af Amer 53 (*)    Anion gap 19 (*)    All other components within normal limits  URINALYSIS, ROUTINE W REFLEX MICROSCOPIC - Abnormal; Notable for the following:    Color, Urine STRAW (*)    Glucose, UA >=500 (*)    Ketones, ur 80 (*)    All other components within normal limits  HEPATIC FUNCTION PANEL - Abnormal; Notable for the following:    ALT 14 (*)    Alkaline Phosphatase 137 (*)    Total Bilirubin 1.4 (*)    Indirect Bilirubin 1.2 (*)    All other components within normal limits  BASIC METABOLIC PANEL - Abnormal; Notable for the following:    Sodium 133 (*)    CO2 15 (*)    Glucose, Bld 534 (*)    BUN 21 (*)    Creatinine, Ser 1.64 (*)    GFR calc non Af Amer 44 (*)    GFR calc Af Amer 51 (*)    Anion gap 17 (*)    All other components within normal limits  CBG MONITORING, ED - Abnormal; Notable for the following:    Glucose-Capillary >600 (*)    All other components within normal limits  I-STAT VENOUS BLOOD GAS, ED - Abnormal; Notable for the following:    pCO2, Ven 30.7 (*)    Bicarbonate 14.5 (*)    TCO2 15 (*)    Acid-base deficit 11.0 (*)    All other components within normal limits  CBG MONITORING, ED - Abnormal; Notable for the following:    Glucose-Capillary 509 (*)    All other components within normal limits  CBC  LIPASE, BLOOD  I-STAT TROPONIN, ED    EKG  EKG Interpretation None       Radiology Ct Abdomen Pelvis W Contrast  Result Date: 09/29/2017 CLINICAL DATA:  61 y/o M; generalized weakness, nausea, and epigastric pain. EXAM: CT ABDOMEN AND PELVIS WITH CONTRAST TECHNIQUE: Multidetector CT imaging of the abdomen and pelvis was performed using the standard protocol following bolus administration of intravenous contrast. CONTRAST:  152m ISOVUE-300 IOPAMIDOL (ISOVUE-300) INJECTION 61% COMPARISON:  05/08/2012 CT abdomen and pelvis. FINDINGS: Lower chest: Small hiatal hernia. Hepatobiliary: No focal liver abnormality is seen. No gallstones, gallbladder wall thickening, or biliary dilatation. Pancreas: Unremarkable. No pancreatic ductal dilatation or surrounding inflammatory changes. Spleen: Normal in size without focal abnormality. Adrenals/Urinary Tract: Adrenal glands are unremarkable. Kidneys are normal, without renal calculi, focal lesion, or hydronephrosis. Bladder is unremarkable. Stomach/Bowel: Stomach is within normal limits. Appendix appears normal. No evidence of bowel wall thickening, distention, or inflammatory changes. Vascular/Lymphatic: No significant vascular findings are present. No enlarged abdominal or pelvic lymph nodes. Reproductive: Prostate is unremarkable. Other: Soft tissue thickening within the bilateral lower abdominal wall probably representing injection sites or sequelae of old trauma. Musculoskeletal: No acute or significant osseous findings. IMPRESSION: 1. Small hiatal hernia. 2. Otherwise unremarkable CT of abdomen and pelvis. Electronically Signed   By: LKristine GarbeM.D.   On: 09/29/2017 17:41   Dg Abd Acute W/chest  Result Date: 09/29/2017 CLINICAL DATA:  Back and abdominal pain.  Diabetic ketoacidosis. EXAM: DG ABDOMEN ACUTE W/ 1V CHEST COMPARISON:  September 22, 2017. FINDINGS: Paucity of bowel gas. There is no evidence of dilated bowel loops or free  intraperitoneal air. Air and stool are seen throughout the colon. No radiopaque calculi or other significant radiographic abnormality is seen. Unchanged phleboliths in the right pelvis. Heart size and mediastinal contours are within normal limits. Both lungs are clear. IMPRESSION: Negative abdominal radiographs.  No acute cardiopulmonary disease. Electronically Signed   By: WTitus DubinM.D.   On: 09/29/2017 16:29    Procedures Procedures (including critical care time)  CRITICAL CARE Performed by: AFrederica Kuster  Total critical care time: 30 minutes  Critical care time was exclusive of separately billable procedures and treating other patients.  Critical care was necessary to treat or prevent imminent or life-threatening deterioration.  Critical care was time spent personally by me on the following activities: development of treatment plan with patient and/or surrogate as well as nursing, discussions with consultants, evaluation of patient's response to treatment, examination of patient, obtaining history from patient or surrogate, ordering and performing treatments and interventions, ordering and review of laboratory studies, ordering and review of radiographic studies, pulse oximetry and re-evaluation of patient's condition.   Medications Ordered in ED Medications  insulin regular (NOVOLIN R,HUMULIN R) 100 Units in sodium chloride 0.9 % 100 mL (1 Units/mL) infusion (4.5 Units/hr Intravenous New Bag/Given 09/29/17 1652)  sodium chloride 0.9 % bolus 1,000 mL (1,000 mLs Intravenous New Bag/Given 09/29/17 1652)    And  0.9 %  sodium chloride infusion ( Intravenous New Bag/Given 09/29/17 1655)  potassium chloride 10 mEq in 100 mL IVPB (10 mEq Intravenous New Bag/Given 09/29/17 1652)  dextrose 5 %-0.45 % sodium chloride infusion (not administered)  metoCLOPramide (REGLAN) injection 10 mg (not administered)  lactated ringers bolus 1,000 mL (1,000 mLs Intravenous New Bag/Given 09/29/17  1652)  iopamidol (ISOVUE-300) 61 % injection (100 mLs  Contrast Given 09/29/17 1711)     Initial Impression / Assessment and Plan / ED Course  I have reviewed the triage vital signs and the nursing notes.  Pertinent labs & imaging results that were available during my care of the patient were reviewed by me and considered in my medical decision making (see chart for details).     Patient presenting with hyperglycemia, found to be in diabetic ketoacidosis.  pH 7.28 on VBG.  Blood glucose initially at 703 with anion gap of 19. UA shows 80 ketones. Fluids and insulin initiated with glucostabilizer. Considering patient's flank and back pain, CT abdomen pelvis without contrast is normal, except for small hiatal hernia. Acute abdomen with chest is negative. Troponin is negative.  Hepatic function panel is stable from past.  I consulted Dr. Cruzita Lederer with [facility] who will admit the patient for further evaluation and treatment. Patient also evaluated by Dr. Kathrynn Humble who guided the patient's management and agrees with plan.  Final Clinical Impressions(s) / ED Diagnoses   Final diagnoses:  Diabetic ketoacidosis without coma associated with type 2 diabetes mellitus ([place])  Left flank pain    New Prescriptions New Prescriptions   No medications on file     Frederica Kuster, PA-C 09/29/17 [place], [place], MD 09/30/17 1354

## 2017-09-29 NOTE — Progress Notes (Signed)
Shay   

## 2017-09-29 NOTE — ED Provider Notes (Signed)
MSE was initiated and I personally evaluated the patient and placed orders (if any) at  2:48 PM on September 29, 2017.  The patient is a 61 year old male who presents with generalized weakness, lightheadedness, nausea, epigastric abdominal pain.  Past medical history significant for insulin-dependent diabetes.  He was recently admitted at [facility] for dehydration/AKI from 10/24-10/26 which was attributed to a viral illness.  He states since then he has gradually been worsening.  He has been taking his insulin as prescribed.  He had a follow-up appointment with his PCP today who did labs and his blood sugar was greater than 600 and had 80 ketones in his urine.  He was sent to the ED for further evaluation.  On exam the patient is in no acute distress.  He is calm and comfortable appearing.  He is tachycardic with a regular rate and rhythm.  Lungs are clear to auscultation bilaterally..  He has tenderness in the epigastric area.  Normal mentation.  DKA order set was utilized.  Fluids and insulin initiated.   The patient appears stable so that the remainder of the MSE may be completed by another provider.    Bethel Born, PA-C 09/29/17 1500    Azalia Bilis, MD 09/29/17 [phone]

## 2017-09-29 NOTE — H&P (Signed)
History and Physical    [name] [name]:[id number] DOB: 06/04/1956 DOA: 09/29/2017  I have briefly reviewed the patient's prior medical records in [place]  PCP: Shawnee Knapp, MD  Patient coming from: home  Chief Complaint: weakness, nausea/epigastric abdominal pain  HPI: [name] [name] is a 61 y.o. male with medical history significant of IDDM, poorly controlled, CAD s/p MI in July 2015 with DES to RCA, HTN, HLD, comed to the ER with complaints as above. He was recently hospitalized and d/c 5 days ago for a probable viral gastroenteritis, had AKI, and improved with fluids and was able to tolerate a diet on d/c.  He felt well at home, however over the last couple of days he has been feeling worsening abdominal pain which is mid epigastric towards the right side, nausea but no vomiting.  He denies any fever at home but has had chills on couple occasions.  He feels like his viral gastroenteritis has now resolved.  He denies any chest pain.  He had a history of MI in 2015 and he never had chest pain with that but did describe back pain.  He currently does not have any back pain however when he was hospitalized last week he has been having back pain which reminded him of his prior heart attack.  He denies any shortness of breath.  He denies any lightheadedness but feels overall quite weak.  ED Course: in the ED his vitals are stable, he is afebrile, normotensive, bloodwork shows elevated CBG to 700 range, bicarb of 15, anion gap of 19. He was started on insulin infusion and fluids and we were asked to admit.  Review of Systems: As per HPI otherwise 10 point review of systems negative.   Past Medical History:  Diagnosis Date  . Barrett's esophagus   . CAD (coronary artery disease)    a. NSTEMI 05/2014 - occluded RCA dominant proximal s/p asp-thrombectomy/DES to RCA, minimal LAD/LCx, EF 50% by cath, 65-70% by echo.  . Depression   . Diabetes type 2, uncontrolled ([place])   . Former tobacco use    . GERD (gastroesophageal reflux disease)   . Hemorrhoids    hx of  . Hypercholesterolemia   . Hypertension   . MI (myocardial infarction) ([place])   . Peripheral neuropathy     Past Surgical History:  Procedure Laterality Date  . LEFT HEART CATHETERIZATION WITH CORONARY ANGIOGRAM N/A 06/14/2014   Procedure: LEFT HEART CATHETERIZATION WITH CORONARY ANGIOGRAM;  Surgeon: Jettie Booze, MD;  Location: [facility] CATH LAB;  Service: Cardiovascular;  Laterality: N/A;  . TONSILLECTOMY       reports that he quit smoking about 18 years ago. He has a 0.75 pack-year smoking history. He has never used smokeless tobacco. He reports that he does not drink alcohol or use drugs.  No Known Allergies  Family History  Problem Relation Age of Onset  . Hypertension Mother   . Alzheimer's disease Mother   . Alcohol abuse Father   . Cancer Father        "Throat"  . Diabetes type II Brother   . Heart disease Neg Hx     Prior to Admission medications   Medication Sig Start Date End Date Taking? Authorizing Provider  blood glucose meter kit and supplies KIT Dispense based on patient and insurance preference. Use four times daily as directed. ICD10 E11.65, Z79.4 06/25/17  Yes Shawnee Knapp, MD  EQ ASPIRIN ADULT LOW DOSE 81 MG EC tablet  TAKE ONE TABLET BY MOUTH ONCE DAILY 07/02/15  Yes Jettie Booze, MD  gabapentin (NEURONTIN) 100 MG capsule Take Two Capsules in the Morning, Afternoon and Bedtime Patient taking differently: Take 200 mg by mouth 3 (three) times daily.  12/14/16  Yes Bayard Hugger, NP  HYDROcodone-acetaminophen (NORCO/VICODIN) 5-325 MG tablet Take 1 tablet by mouth every 6 (six) hours as needed for moderate pain (pain). 08/30/17  Yes Meredith Staggers, MD  insulin aspart (NOVOLOG FLEXPEN) 100 UNIT/ML FlexPen INJECT 30 TO 35 UNITS SUBCUTANEOUSLY THREE TIMES DAILY Patient taking differently: Inject 30-35 Units into the skin 3 (three) times daily. INJECT 30 TO 35 UNITS SUBCUTANEOUSLY THREE  TIMES DAILY 06/25/17  Yes Shawnee Knapp, MD  Insulin Pen Needle (B-D UF III MINI PEN NEEDLES) 31G X 5 MM MISC Use to inject insulin 5 pen needles per day 06/25/17  Yes Shawnee Knapp, MD  LANTUS SOLOSTAR 100 UNIT/ML Solostar Pen Inject 34 Units into the skin 2 (two) times daily. 08/28/17  Yes [provider]  lisinopril (PRINIVIL,ZESTRIL) 2.5 MG tablet Take 1 tablet (2.5 mg total) by mouth daily. 06/25/17  Yes Shawnee Knapp, MD  metoprolol tartrate (LOPRESSOR) 25 MG tablet Take 0.5 tablets (12.5 mg total) by mouth 2 (two) times daily. 06/25/17  Yes Shawnee Knapp, MD  nitroGLYCERIN (NITROSTAT) 0.4 MG SL tablet Place 1 tablet (0.4 mg total) under the tongue every 5 (five) minutes as needed for chest pain (up to 3 doses). 11/10/16  Yes Jettie Booze, MD  pantoprazole (PROTONIX) 40 MG tablet Take 1 tablet (40 mg total) by mouth daily. 06/25/17  Yes Shawnee Knapp, MD  Phenylephrine-DM-GG-APAP (DELSYM COUGH/COLD DAYTIME PO) Take 15 mLs by mouth daily as needed (COUGH).   Yes [provider]  prasugrel (EFFIENT) 10 MG TABS tablet Take 1 tablet (10 mg total) by mouth daily. 06/25/17  Yes Shawnee Knapp, MD  rosuvastatin (CRESTOR) 40 MG tablet Take 1 tablet (40 mg total) by mouth daily. 06/25/17  Yes Shawnee Knapp, MD  Skin Protectants, Misc. (ABSORBASE EX) Apply 1 application topically daily as needed (MUSCLE ACHES).    Yes [provider]    Physical Exam: Vitals:   09/29/17 1600 09/29/17 1630 09/29/17 1645 09/29/17 1700  BP: (!) 142/82 129/76 134/75 128/68  Pulse: (!) 105  (!) 102 99  Resp: 13  19 (!) 23  Temp:      TempSrc:      SpO2: 97%  95% 97%      Constitutional: NAD, calm, comfortable Eyes: PERRL, lids and conjunctivae normal ENMT: Mucous membranes are dry Neck: normal, supple, no masses, no thyromegaly Respiratory: clear to auscultation bilaterally, no wheezing, no crackles. Normal respiratory effort. No accessory muscle use.  Cardiovascular: Regular rate and rhythm, no  murmurs / rubs / gallops. No extremity edema. 2+ pedal pulses.  Abdomen: Mild tenderness in the midepigastric right upper quadrant area, no masses palpated. Bowel sounds positive.  Musculoskeletal: no clubbing / cyanosis. Normal muscle tone.  Skin: no rashes, lesions, ulcers. No induration Neurologic: CN 2-12 grossly intact. Strength 5/5 in all 4.  Psychiatric: Normal judgment and insight. Alert and oriented x 3. Normal mood.   Labs on Admission: I have personally reviewed following labs and imaging studies  CBC:  Recent Labs Lab 09/23/17 0447 09/24/17 0454 09/29/17 1107 09/29/17 1306  WBC 9.3 5.0 6.3 8.4  HGB 12.6* 12.3* 13.5* 13.6  HCT 37.9* 36.2* 39.6* 40.6  MCV 86.5 85.6 84.6 86.6  PLT 274 242  --  527   Basic Metabolic Panel:  Recent Labs Lab 09/23/17 0447 09/24/17 0454 09/29/17 1059 09/29/17 1306  NA 136 140 134 131*  K 4.9 3.8 4.6 4.5  CL 108 112* 99 97*  CO2 14* 21* 18* 15*  GLUCOSE 372* 153* 543* 703*  BUN 26* 23* 19 20  CREATININE 1.53* 1.03 1.01 1.60*  CALCIUM 8.8* 8.4* 9.2 8.8*   GFR: Estimated Creatinine Clearance: 59.4 mL/min (A) (by C-G formula based on SCr of 1.6 mg/dL (H)). Liver Function Tests:  Recent Labs Lab 09/29/17 1059 09/29/17 1607  AST 13 19  ALT 10 14*  ALKPHOS 145* 137*  BILITOT 0.5 1.4*  PROT 6.6 6.9  ALBUMIN 4.2 4.0    Recent Labs Lab 09/29/17 1607  LIPASE 21   No results for input(s): AMMONIA in the last 168 hours. Coagulation Profile: No results for input(s): INR, PROTIME in the last 168 hours. Cardiac Enzymes: No results for input(s): CKTOTAL, CKMB, CKMBINDEX, TROPONINI in the last 168 hours. BNP (last 3 results) No results for input(s): PROBNP in the last 8760 hours. HbA1C:  Recent Labs  09/29/17 1043  HGBA1C 8.5   CBG:  Recent Labs Lab 09/23/17 2228 09/24/17 0813 09/24/17 1201 09/29/17 1244 09/29/17 1608  GLUCAP 134* 110* 176* >600* 509*   Lipid Profile: No results for input(s): CHOL, HDL,  LDLCALC, TRIG, CHOLHDL, LDLDIRECT in the last 72 hours. Thyroid Function Tests: No results for input(s): TSH, T4TOTAL, FREET4, T3FREE, THYROIDAB in the last 72 hours. Anemia Panel: No results for input(s): VITAMINB12, FOLATE, FERRITIN, TIBC, IRON, RETICCTPCT in the last 72 hours. Urine analysis:    Component Value Date/Time   COLORURINE STRAW (A) 09/29/2017 1312   APPEARANCEUR CLEAR 09/29/2017 1312   LABSPEC 1.026 09/29/2017 1312   PHURINE 5.0 09/29/2017 1312   GLUCOSEU >=500 (A) 09/29/2017 1312   HGBUR NEGATIVE 09/29/2017 1312   BILIRUBINUR NEGATIVE 09/29/2017 1312   BILIRUBINUR negative 09/29/2017 1041   KETONESUR 80 (A) 09/29/2017 1312   PROTEINUR NEGATIVE 09/29/2017 1312   UROBILINOGEN 0.2 09/29/2017 1041   UROBILINOGEN 0.2 06/14/2014 0809   NITRITE NEGATIVE 09/29/2017 1312   LEUKOCYTESUR NEGATIVE 09/29/2017 1312     Radiological Exams on Admission: Dg Abd Acute W/chest  Result Date: 09/29/2017 CLINICAL DATA:  Back and abdominal pain.  Diabetic ketoacidosis. EXAM: DG ABDOMEN ACUTE W/ 1V CHEST COMPARISON:  September 22, 2017. FINDINGS: Paucity of bowel gas. There is no evidence of dilated bowel loops or free intraperitoneal air. Air and stool are seen throughout the colon. No radiopaque calculi or other significant radiographic abnormality is seen. Unchanged phleboliths in the right pelvis. Heart size and mediastinal contours are within normal limits. Both lungs are clear. IMPRESSION: Negative abdominal radiographs.  No acute cardiopulmonary disease. Electronically Signed   By: Titus Dubin M.D.   On: 09/29/2017 16:29    EKG: Independently reviewed. Sinus rhythm  Assessment/Plan Active Problems:   DM (diabetes mellitus), type 2, uncontrolled (H[facility])   GERD (gastroesophageal reflux disease)   CAD (coronary artery disease)   DKA (diabetic ketoacidoses) (H[facility])   AKI (acute kidney injury) ([place])    DKA -unclear precipitant, admit to SDU, NPO, insulin IV, fluids -resume  Lantus when gap is closed -Obtain CT scan of the abdomen and pelvis given abdominal pain and recent viral gastroenteritis  CAD -no chest pain, troponin negative -continue metoprolol, effient, statin  AKI -due to dehydration / DKA -monitor Cr with fluids -hold ACEI  HTN -continue metoprolol, hold ACEI  as above  HLD -continue statin    DVT prophylaxis: Lovenox  Code Status: Full code  Family Communication: no family at bedside Disposition Plan: admit to SDU, home when ready Consults called: none     Admission status: Observation   At the point of initial evaluation, it is my clinical opinion that admission for OBSERVATION is reasonable and necessary because the patient's presenting complaints in the context of their chronic conditions represent sufficient risk of deterioration or significant morbidity to constitute reasonable grounds for close observation in the hospital setting, but that the patient may be medically stable for discharge from the hospital within 24 to 48 hours.   Marzetta Board, MD [facility] Pager [phone]  If 7PM-7AM, please contact night-coverage [email or web address] Password [email or web address]  09/29/2017, 5:19 PM

## 2017-09-29 NOTE — ED Notes (Signed)
Per admitting doc, patient able to have ice and sips of water.

## 2017-09-29 NOTE — Patient Instructions (Addendum)
Your glucose cannot be read and you have large ketones in your urine and are orthostatic. You need IV fluids and insulin now and we need to know what your kidneys and salts in your blood are doing in real time so go directly to [place] [facility].   IF you received an x-ray today, you will receive an invoice from [facility]. Please contact [facility] at [phone] with questions or concerns regarding your invoice.   IF you received labwork today, you will receive an invoice from [place]. Please contact [facility] at [phone] with questions or concerns regarding your invoice.   Our billing staff will not be able to assist you with questions regarding bills from these companies.  You will be contacted with the lab results as soon as they are available. The fastest way to get your results is to activate your My Chart account. Instructions are located on the last page of this paperwork. If you have not heard from [place] regarding the results in 2 weeks, please contact this office.     Hyperglycemia Hyperglycemia occurs when the level of sugar (glucose) in the blood is too high. Glucose is a type of sugar that provides the body's main source of energy. Certain hormones (insulin and glucagon) control the level of glucose in the blood. Insulin lowers blood glucose, and glucagon increases blood glucose. Hyperglycemia can result from having too little insulin in the bloodstream, or from the body not responding normally to insulin. Hyperglycemia occurs most often in people who have diabetes (diabetes mellitus), but it can happen in people who do not have diabetes. It can develop quickly, and it can be life-threatening if it causes you to become severely dehydrated (diabetic ketoacidosis or hyperglycemic hyperosmolar state). Severe hyperglycemia is a medical emergency. What are the causes? If you have diabetes, hyperglycemia may be caused by:  Diabetes medicine.  Medicines that increase  blood glucose or affect your diabetes control.  Not eating enough, or not eating often enough.  Changes in physical activity level.  Being sick or having an infection.  If you have prediabetes or undiagnosed diabetes:  Hyperglycemia may be caused by those conditions.  If you do not have diabetes, hyperglycemia may be caused by:  Certain medicines, including steroid medicines, beta-blockers, epinephrine, and thiazide diuretics.  Stress.  Serious illness.  Surgery.  Diseases of the pancreas.  Infection.  What increases the risk? Hyperglycemia is more likely to develop in people who have risk factors for diabetes, such as:  Having a family member with diabetes.  Having a gene for type 1 diabetes that is passed from parent to child (inherited).  Living in an area with cold weather conditions.  Exposure to certain viruses.  Certain conditions in which the body's disease-fighting (immune) system attacks itself (autoimmune disorders).  Being overweight or obese.  Having an inactive (sedentary) lifestyle.  Having been diagnosed with insulin resistance.  Having a history of prediabetes, gestational diabetes, or polycystic ovarian syndrome (PCOS).  Being of American-Indian, African-American, Hispanic/Latino, or Asian/Pacific Islander descent.  What are the signs or symptoms? Hyperglycemia may not cause any symptoms. If you do have symptoms, they may include early warning signs, such as:  Increased thirst.  Hunger.  Feeling very tired.  Needing to urinate more often than usual.  Blurry vision.  Other symptoms may develop if hyperglycemia gets worse, such as:  Dry mouth.  Loss of appetite.  Fruity-smelling breath.  Weakness.  Unexpected or rapid weight gain or weight loss.  Tingling  or numbness in the hands or feet.  Headache.  Skin that does not quickly return to normal after being lightly pinched and released (poor skin turgor).  Abdominal  pain.  Cuts or bruises that are slow to heal.  How is this diagnosed? Hyperglycemia is diagnosed with a blood test to measure your blood glucose level. This blood test is usually done while you are having symptoms. Your health care provider may also do a physical exam and review your medical history. You may have more tests to determine the cause of your hyperglycemia, such as:  A fasting blood glucose (FBG) test. You will not be allowed to eat (you will fast) for at least 8 hours before a blood sample is taken.  An A1c (hemoglobin A1c) blood test. This provides information about blood glucose control over the previous 2-3 months.  An oral glucose tolerance test (OGTT). This measures your blood glucose at two times: ? After fasting. This is your baseline blood glucose level. ? Two hours after drinking a beverage that contains glucose.  How is this treated? Treatment depends on the cause of your hyperglycemia. Treatment may include:  Taking medicine to regulate your blood glucose levels. If you take insulin or other diabetes medicines, your medicine or dosage may be adjusted.  Lifestyle changes, such as exercising more, eating healthier foods, or losing weight.  Treating an illness or infection, if this caused your hyperglycemia.  Checking your blood glucose more often.  Stopping or reducing steroid medicines, if these caused your hyperglycemia.  If your hyperglycemia becomes severe and it results in hyperglycemic hyperosmolar state, you must be hospitalized and given IV fluids. Follow these instructions at home: General instructions  Take over-the-counter and prescription medicines only as told by your health care provider.  Do not use any products that contain nicotine or tobacco, such as cigarettes and e-cigarettes. If you need help quitting, ask your health care provider.  Limit alcohol intake to no more than 1 drink per day for nonpregnant women and 2 drinks per day for men.  One drink equals 12 oz of beer, 5 oz of wine, or 1 oz of hard liquor.  Learn to manage stress. If you need help with this, ask your health care provider.  Keep all follow-up visits as told by your health care provider. This is important. Eating and drinking  Maintain a healthy weight.  Exercise regularly, as directed by your health care provider.  Stay hydrated, especially when you exercise, get sick, or spend time in hot temperatures.  Eat healthy foods, such as: ? Lean proteins. ? Complex carbohydrates. ? Fresh fruits and vegetables. ? Low-fat dairy products. ? Healthy fats.  Drink enough fluid to keep your urine clear or pale yellow. If you have diabetes:   Make sure you know the symptoms of hyperglycemia.  Follow your diabetes management plan, as told by your health care provider. Make sure you: ? Take your insulin and medicines as directed. ? Follow your exercise plan. ? Follow your meal plan. Eat on time, and do not skip meals. ? Check your blood glucose as often as directed. Make sure to check your blood glucose before and after exercise. If you exercise longer or in a different way than usual, check your blood glucose more often. ? Follow your sick day plan whenever you cannot eat or drink normally. Make this plan in advance with your health care provider.  Share your diabetes management plan with people in your workplace, school, and household.  Check your urine for ketones when you are ill and as told by your health care provider.  Carry a medical alert card or wear medical alert jewelry. Contact a health care provider if:  Your blood glucose is at or above 240 mg/dL (40.913.3 mmol/L) for 2 days in a row.  You have problems keeping your blood glucose in your target range.  You have frequent episodes of hyperglycemia. Get help right away if:  You have difficulty breathing.  You have a change in how you think, feel, or act (mental status).  You have nausea or  vomiting that does not go away. These symptoms may represent a serious problem that is an emergency. Do not wait to see if the symptoms will go away. Get medical help right away. Call your local emergency services (911 in the [place] Do not drive yourself to the hospital. Summary  Hyperglycemia occurs when the level of sugar (glucose) in the blood is too high.  Hyperglycemia is diagnosed with a blood test to measure your blood glucose level. This blood test is usually done while you are having symptoms. Your health care provider may also do a physical exam and review your medical history.  If you have diabetes, follow your diabetes management plan as told by your health care provider.  Contact your health care provider if you have problems keeping your blood glucose in your target range. This information is not intended to replace advice given to you by your health care provider. Make sure you discuss any questions you have with your health care provider. Document Released: 05/12/2001 Document Revised: 08/03/2016 Document Reviewed: 08/03/2016 Elsevier Interactive Patient Education  2018 [employer]   Diabetic Ketoacidosis Diabetic ketoacidosis is a life-threatening complication of diabetes. If it is not treated, it can cause severe dehydration and organ damage and can lead to a coma or death. What are the causes? This condition develops when there is not enough of the hormone insulin in the body. Insulin helps the body to break down sugar for energy. Without insulin, the body cannot break down sugar, so it breaks down fats instead. This leads to the production of acids that are called ketones. Ketones are poisonous at high levels. This condition can be triggered by:  Stress on the body that is brought on by an illness.  Medicines that raise blood glucose levels.  Not taking diabetes medicine.  What are the signs or symptoms? Symptoms of this condition include:  Fatigue.  Weight  loss.  Excessive thirst.  Light-headedness.  Fruity or sweet-smelling breath.  Excessive urination.  Vision changes.  Confusion or irritability.  Nausea.  Vomiting.  Rapid breathing.  Abdominal pain.  Feeling flushed.  How is this diagnosed? This condition is diagnosed based on a medical history, a physical exam, and blood tests. You may also have a urine test that checks for ketones. How is this treated? This condition may be treated with:  Fluid replacement. This may be done to correct dehydration.  Insulin injections. These may be given through the skin or through an IV tube.  Electrolyte replacement. Electrolytes, such as potassium and sodium, may be given in pill form or through an IV tube.  Antibiotic medicines. These may be prescribed if your condition was caused by an infection.  Follow these instructions at home: Eating and drinking  Drink enough fluids to keep your urine clear or pale yellow.  If you cannot eat, alternate between drinking fluids with sugar (such as juice) and salty fluids (  such as broth or bouillon).  If you can eat, follow your usual diet and drink sugar-free liquids, such as water. Other Instructions   Take insulin as directed by your health care provider. Do not skip insulin injections. Do not use expired insulin.  If your blood sugar is over 240 mg/dL, monitor your urine ketones every 4-6 hours.  If you were prescribed an antibiotic medicine, finish all of it even if you start to feel better.  Rest and exercise only as directed by your health care provider.  If you get sick, call your health care provider and begin treatment quickly. Your body often needs extra insulin to fight an illness.  Check your blood glucose levels regularly. If your blood glucose is high, drink plenty of fluids. This helps to flush out ketones. Contact a health care provider if:  Your blood glucose level is too high or too low.  You have ketones in  your urine.  You have a fever.  You cannot eat.  You cannot tolerate fluids.  You have been vomiting for more than 2 hours.  You continue to have symptoms of this condition.  You develop new symptoms. Get help right away if:  Your blood glucose levels continue to be high (elevated).  Your monitor reads "high" even when you are taking insulin.  You faint.  You have chest pain.  You have trouble breathing.  You have a sudden, severe headache.  You have sudden weakness in one arm or one leg.  You have sudden trouble speaking or swallowing.  You have vomiting or diarrhea that gets worse after 3 hours.  You feel severely fatigued.  You have trouble thinking.  You have abdominal pain.  You are severely dehydrated. Symptoms of severe dehydration include: ? Extreme thirst. ? Dry mouth. ? Blue lips. ? Cold hands and feet. ? Rapid breathing. This information is not intended to replace advice given to you by your health care provider. Make sure you discuss any questions you have with your health care provider. Document Released: 11/13/2000 Document Revised: 04/23/2016 Document Reviewed: 10/24/2014 Elsevier Interactive Patient Education  2017 [employer].

## 2017-09-29 NOTE — Progress Notes (Addendum)
Subjective:  This chart was scribed for Shawnee Knapp, MD by Tamsen Roers, at [place] at [facility].  This patient was seen in room 2 and the patient's care was started at 10:46 AM.   Chief Complaint  Patient presents with  . Diabetes    follow-up  . Fatigue     Patient ID: [name] [name], male    DOB: 16-Nov-1956, 61 y.o.   MRN: [id number]  HPI HPI Comments: [name] [name] is a 61 y.o. male who presents to Primary Care at [facility] for a follow up. He was hospitalized from the 59 th to the 26 th for hyperglycemia and acute renal failure. He is here for a hospital follow up, needs follow up BMP. No changes were made to his medication regimen. Micro albumin: normal on July 2018. Lipids last checked one year prior. LDL 158, goal less than 70.  Hemoglobin A1C three months prior 87. mildly elevated bilirubin, normal lipase.     Patient still feels nauseated and doesn't feel like he has fully recovered since his hospital stay. His sugars were up and down during the week before his hospital visit (highest 500) and states that he has not checked it since he has been out as he lost his meter in the ED.  He is still urinating frequently and has dry mouth.  He is also having upper back pain which feels like a milder similar version before he had a stent put in after his heart attack.  He last ate yesterday evening as he has been nauseas and is not able to eat much. Patient denies chest pain.    Echocardiogram 2014: EF 65-70%    Past Medical History:  Diagnosis Date  . Barrett's esophagus   . CAD (coronary artery disease)    a. NSTEMI 05/2014 - occluded RCA dominant proximal s/p asp-thrombectomy/DES to RCA, minimal LAD/LCx, EF 50% by cath, 65-70% by echo.  . Depression   . Diabetes type 2, uncontrolled ([place])   . Former tobacco use   . GERD (gastroesophageal reflux disease)   . Hemorrhoids    hx of  . Hypercholesterolemia   . Hypertension   . MI (myocardial infarction) ([place])   .  Peripheral neuropathy     Current Outpatient Prescriptions on File Prior to Visit  Medication Sig Dispense Refill  . blood glucose meter kit and supplies KIT Dispense based on patient and insurance preference. Use four times daily as directed. ICD10 E11.65, Z79.4 1 each 0  . EQ ASPIRIN ADULT LOW DOSE 81 MG EC tablet TAKE ONE TABLET BY MOUTH ONCE DAILY 90 tablet 0  . gabapentin (NEURONTIN) 100 MG capsule Take Two Capsules in the Morning, Afternoon and Bedtime (Patient taking differently: Take 200 mg by mouth 3 (three) times daily. ) 180 capsule 3  . HYDROcodone-acetaminophen (NORCO/VICODIN) 5-325 MG tablet Take 1 tablet by mouth every 6 (six) hours as needed for moderate pain (pain). 120 tablet 0  . insulin aspart (NOVOLOG FLEXPEN) 100 UNIT/ML FlexPen INJECT 30 TO 35 UNITS SUBCUTANEOUSLY THREE TIMES DAILY 30 pen 1  . Insulin Pen Needle (B-D UF III MINI PEN NEEDLES) 31G X 5 MM MISC Use to inject insulin 5 pen needles per day 150 each 2  . LANTUS SOLOSTAR 100 UNIT/ML Solostar Pen INJECT 36 UNITS SUBCUTANEOUSLY TWICE DAILY 20 pen 1  . lisinopril (PRINIVIL,ZESTRIL) 2.5 MG tablet Take 1 tablet (2.5 mg total) by mouth daily. 90 tablet 1  . metoprolol tartrate (LOPRESSOR) 25 MG tablet  Take 0.5 tablets (12.5 mg total) by mouth 2 (two) times daily. 90 tablet 1  . nitroGLYCERIN (NITROSTAT) 0.4 MG SL tablet Place 1 tablet (0.4 mg total) under the tongue every 5 (five) minutes as needed for chest pain (up to 3 doses). 25 tablet 0  . pantoprazole (PROTONIX) 40 MG tablet Take 1 tablet (40 mg total) by mouth daily. 90 tablet 3  . Phenylephrine-DM-GG-APAP (DELSYM COUGH/COLD DAYTIME PO) Take 15 mLs by mouth daily as needed (COUGH).    . prasugrel (EFFIENT) 10 MG TABS tablet Take 1 tablet (10 mg total) by mouth daily. 90 tablet 1  . rosuvastatin (CRESTOR) 40 MG tablet Take 1 tablet (40 mg total) by mouth daily. 90 tablet 1  . Skin Protectants, Misc. (ABSORBASE EX) Apply 1 application topically daily as needed  (MUSCLE ACHES).      No current facility-administered medications on file prior to visit.     No Known Allergies  Past Surgical History:  Procedure Laterality Date  . LEFT HEART CATHETERIZATION WITH CORONARY ANGIOGRAM N/A 06/14/2014   Procedure: LEFT HEART CATHETERIZATION WITH CORONARY ANGIOGRAM;  Surgeon: Jettie Booze, MD;  Location: [facility] CATH LAB;  Service: Cardiovascular;  Laterality: N/A;  . TONSILLECTOMY     Family History  Problem Relation Age of Onset  . Hypertension Mother   . Alzheimer's disease Mother   . Alcohol abuse Father   . Cancer Father        "Throat"  . Diabetes type II Brother   . Heart disease Neg Hx    Social History   Social History  . Marital status: Married    Spouse name: N/A  . Number of children: N/A  . Years of education: N/A   Occupational History  . unemployed     applying for disability   Social History Main Topics  . Smoking status: Former Smoker    Packs/day: 0.25    Years: 3.00    Quit date: 11/30/1998  . Smokeless tobacco: Never Used  . Alcohol use No  . Drug use: No  . Sexual activity: Yes    Birth control/ protection: None   Other Topics Concern  . None   Social History Narrative  . None   Depression screen [facility] 2/9 09/29/2017 03/08/2017 02/08/2017 09/15/2016 08/25/2016  Decreased Interest 0 0 0 0 0  Down, Depressed, Hopeless 0 0 0 0 0  PHQ - 2 Score 0 0 0 0 0  Altered sleeping - - - - -  Tired, decreased energy - - - - -  Change in appetite - - - - -  Feeling bad or failure about yourself  - - - - -  Trouble concentrating - - - - -  Moving slowly or fidgety/restless - - - - -  Suicidal thoughts - - - - -  PHQ-9 Score - - - - -  Difficult doing work/chores - - - - -    Review of Systems  Constitutional: Negative for chills and fever.  HENT:       Dry mouth  Eyes: Negative for pain, redness and itching.  Respiratory: Negative for cough and choking.   Cardiovascular: Negative for chest pain.  Gastrointestinal:  Positive for nausea.  Genitourinary: Positive for frequency.  Musculoskeletal: Positive for back pain.  Neurological: Negative for seizures and syncope.       Objective:   Physical Exam  Constitutional: He is oriented to person, place, and time.  HENT:  Head: Normocephalic and atraumatic.  Cardiovascular:  Normal rate and regular rhythm.   Pulmonary/Chest: Effort normal and breath sounds normal. No respiratory distress.  Good air movement.   Neurological: He is alert and oriented to person, place, and time.  Skin: Skin is warm and dry.   Vitals:   09/29/17 1026  BP: 108/62  Pulse: (!) 102  Resp: 16  Temp: 98 F (36.7 C)  TempSrc: Oral  SpO2: 97%  Weight: 221 lb (100.2 kg)  Height: 5' 10.47" (1.79 m)    Results for orders placed or performed in visit on 09/29/17  POCT urinalysis dipstick  Result Value Ref Range   Color, UA yellow yellow   Clarity, UA clear clear   Glucose, UA =500 (A) negative mg/dL   Bilirubin, UA negative negative   Ketones, POC UA large (80) (A) negative mg/dL   Spec Grav, UA 1.010 1.010 - 1.025   Blood, UA negative negative   pH, UA 5.5 5.0 - 8.0   Protein Ur, POC negative negative mg/dL   Urobilinogen, UA 0.2 0.2 or 1.0 E.U./dL   Nitrite, UA Negative Negative   Leukocytes, UA Negative Negative  POCT glycosylated hemoglobin (Hb A1C)  Result Value Ref Range   Hemoglobin A1C 8.5   POCT glucose (manual entry)  Result Value Ref Range   POC Glucose HHH 70 - 99 mg/dl  POCT CBC  Result Value Ref Range   WBC 6.3 4.6 - 10.2 K/uL   Lymph, poc 2.2 0.6 - 3.4   POC LYMPH PERCENT 34.5 10 - 50 %L   MID (cbc) 0.3 0 - 0.9   POC MID % 5.0 0 - 12 %M   POC Granulocyte 3.8 2 - 6.9   Granulocyte percent 60.5 37 - 80 %G   RBC 4.68 (A) 4.69 - 6.13 M/uL   Hemoglobin 13.5 (A) 14.1 - 18.1 g/dL   HCT, POC 39.6 (A) 43.5 - 53.7 %   MCV 84.6 80 - 97 fL   MCH, POC 28.9 27 - 31.2 pg   MCHC 34.2 31.8 - 35.4 g/dL   RDW, POC 13.6 %   Platelet Count, POC 300 142 -  424 K/uL   MPV 7.7 0 - 99.8 fL   EKG: NSR, no acute ischemic changes noted. No significant change noted when compared to prior EKG done 09/19/2015 except for lead III dominant Q wave is now dominant R waves and decreased voltage in chest leads today. I have personally reviewed the EKG tracing and agree with the computer interpretation: Sinus  Rhythm  WITHIN NORMAL LIMITS  Orthostatic VS Lying 100/58 HR 99 Sitting 108/58 HR 97 Standing 90/60 HR 110    Assessment & Plan:   1. Uncontrolled type 2 diabetes mellitus with hyperglycemia ([place])   2. Non-intractable vomiting with nausea, unspecified vomiting type   3. Pure hypercholesterolemia   4. Screening examination for infectious disease    Received note from insurance that lantus is no longer on insurance - will change to levimir - increase the 36u bid to 40u bid. TO ER due to cbg to high to read in office, large ketones in urine. Pt symptomatic of DKA with tachycardia, hypotension, nausea, abd pain.  Likely triggered as pt has not been using insulin (ran out) or checking cbgs - ate pasta last night.  Orders Placed This Encounter  Procedures  . Lipid panel    Order Specific Question:   Has the patient fasted?    Answer:   Yes  . HCV Ab w/Rflx to Verification  .  Comprehensive metabolic panel  . Comprehensive metabolic panel  . Interpretation:  . Orthostatic vital signs  . POCT urinalysis dipstick  . POCT glycosylated hemoglobin (Hb A1C)  . POCT glucose (manual entry)  . POCT CBC  . EKG 12-Lead  . Insert peripheral IV    Meds ordered this encounter  Medications  . Insulin Detemir (LEVEMIR FLEXTOUCH) 100 UNIT/ML Pen    Sig: Inject 40 Units into the skin 2 (two) times daily.    Dispense:  75 mL    Refill:  1    I personally performed the services described in this documentation, which was scribed in my presence. The recorded information has been reviewed and considered, and addended by me as needed.   [name], M.D.    Primary Care at [facility] [address] [place], [place] [address] [phone] phone [phone] fax  10/01/17 12:51 AM

## 2017-09-29 NOTE — ED Notes (Signed)
IV Attempted x2 no success another attempt to be made.   PA at bedside for assessment.

## 2017-09-29 NOTE — ED Triage Notes (Signed)
Patient sent from MD office for blood sugar running high. Was just admitted to [facility] last week for same and taking insulin as prescribed. States ongoing polyuria and thirst. Alert and oriented

## 2017-09-30 ENCOUNTER — Encounter: Payer: Medicare HMO | Admitting: Registered Nurse

## 2017-09-30 DIAGNOSIS — R109 Unspecified abdominal pain: Secondary | ICD-10-CM | POA: Diagnosis not present

## 2017-09-30 DIAGNOSIS — I25118 Atherosclerotic heart disease of native coronary artery with other forms of angina pectoris: Secondary | ICD-10-CM | POA: Diagnosis not present

## 2017-09-30 DIAGNOSIS — E1165 Type 2 diabetes mellitus with hyperglycemia: Secondary | ICD-10-CM | POA: Diagnosis not present

## 2017-09-30 DIAGNOSIS — E1110 Type 2 diabetes mellitus with ketoacidosis without coma: Secondary | ICD-10-CM | POA: Diagnosis not present

## 2017-09-30 DIAGNOSIS — E0810 Diabetes mellitus due to underlying condition with ketoacidosis without coma: Secondary | ICD-10-CM | POA: Diagnosis not present

## 2017-09-30 DIAGNOSIS — N179 Acute kidney failure, unspecified: Secondary | ICD-10-CM | POA: Diagnosis not present

## 2017-09-30 LAB — BASIC METABOLIC PANEL
ANION GAP: 10 (ref 5–15)
ANION GAP: 8 (ref 5–15)
ANION GAP: 9 (ref 5–15)
Anion gap: 8 (ref 5–15)
BUN: 11 mg/dL (ref 6–20)
BUN: 10 mg/dL (ref 6–20)
BUN: 8 mg/dL (ref 6–20)
BUN: 11 mg/dL (ref 6–20)
CALCIUM: 8.3 mg/dL — AB (ref 8.9–10.3)
CALCIUM: 8.4 mg/dL — AB (ref 8.9–10.3)
CHLORIDE: 109 mmol/L (ref 101–111)
CHLORIDE: 107 mmol/L (ref 101–111)
CO2: 18 mmol/L — AB (ref 22–32)
CO2: 20 mmol/L — ABNORMAL LOW (ref 22–32)
CO2: 19 mmol/L — AB (ref 22–32)
CO2: 20 mmol/L — AB (ref 22–32)
CREATININE: 1.08 mg/dL (ref 0.61–1.24)
CREATININE: 1.04 mg/dL (ref 0.61–1.24)
Calcium: 8.5 mg/dL — ABNORMAL LOW (ref 8.9–10.3)
Calcium: 8.4 mg/dL — ABNORMAL LOW (ref 8.9–10.3)
Chloride: 109 mmol/L (ref 101–111)
Chloride: 107 mmol/L (ref 101–111)
Creatinine, Ser: 0.94 mg/dL (ref 0.61–1.24)
Creatinine, Ser: 1.01 mg/dL (ref 0.61–1.24)
GFR calc Af Amer: >60 mL/min (ref >60)
GFR calc Af Amer: >60 mL/min (ref >60)
GFR calc non Af Amer: >60 mL/min (ref >60)
GFR calc non Af Amer: >60 mL/min (ref >60)
GFR calc non Af Amer: >60 mL/min (ref >60)
GLUCOSE: 275 mg/dL — AB (ref 65–99)
Glucose, Bld: 184 mg/dL — ABNORMAL HIGH (ref 65–99)
Glucose, Bld: 165 mg/dL — ABNORMAL HIGH (ref 65–99)
Glucose, Bld: 248 mg/dL — ABNORMAL HIGH (ref 65–99)
POTASSIUM: 4 mmol/L (ref 3.5–5.1)
POTASSIUM: 3.7 mmol/L (ref 3.5–5.1)
Potassium: 4 mmol/L (ref 3.5–5.1)
Potassium: 3.7 mmol/L (ref 3.5–5.1)
Sodium: 137 mmol/L (ref 135–145)
Sodium: 137 mmol/L (ref 135–145)
Sodium: 135 mmol/L (ref 135–145)
Sodium: 135 mmol/L (ref 135–145)

## 2017-09-30 LAB — HEPATIC FUNCTION PANEL
ALBUMIN: 3.4 g/dL — AB (ref 3.5–5.0)
ALK PHOS: 110 U/L (ref 38–126)
ALT: 11 U/L — AB (ref 17–63)
AST: 14 U/L — ABNORMAL LOW (ref 15–41)
Bilirubin, Direct: 0.2 mg/dL (ref 0.1–0.5)
Indirect Bilirubin: 0.8 mg/dL (ref 0.3–0.9)
TOTAL PROTEIN: 5.6 g/dL — AB (ref 6.5–8.1)
Total Bilirubin: 1 mg/dL (ref 0.3–1.2)

## 2017-09-30 LAB — COMPREHENSIVE METABOLIC PANEL
ALBUMIN: 4.4 g/dL (ref 3.5–5.5)
ALT: 12 IU/L (ref 0–44)
AST: 15 IU/L (ref 0–40)
Albumin/Globulin Ratio: 2.2 (ref 1.2–2.2)
Alkaline Phosphatase: 139 IU/L — ABNORMAL HIGH (ref 39–117)
BILIRUBIN TOTAL: 0.5 mg/dL (ref 0.0–1.2)
BUN / CREAT RATIO: 17 (ref 9–20)
BUN: 19 mg/dL (ref 6–24)
CO2: 16 mmol/L — AB (ref 20–29)
CREATININE: 1.11 mg/dL (ref 0.76–1.27)
Calcium: 8.9 mg/dL (ref 8.7–10.2)
Chloride: 96 mmol/L (ref 96–106)
GFR calc non Af Amer: 72 mL/min/{1.73_m2} (ref >59)
GFR, EST AFRICAN AMERICAN: 84 mL/min/{1.73_m2} (ref >59)
GLUCOSE: 543 mg/dL — AB (ref 65–99)
Globulin, Total: 2 g/dL (ref 1.5–4.5)
Potassium: 4.4 mmol/L (ref 3.5–5.2)
Sodium: 135 mmol/L (ref 134–144)
TOTAL PROTEIN: 6.4 g/dL (ref 6.0–8.5)

## 2017-09-30 LAB — GLUCOSE, CAPILLARY
GLUCOSE-CAPILLARY: 345 mg/dL — AB (ref 65–99)
GLUCOSE-CAPILLARY: 115 mg/dL — AB (ref 65–99)
GLUCOSE-CAPILLARY: 156 mg/dL — AB (ref 65–99)
GLUCOSE-CAPILLARY: 158 mg/dL — AB (ref 65–99)
GLUCOSE-CAPILLARY: 157 mg/dL — AB (ref 65–99)
Glucose-Capillary: 129 mg/dL — ABNORMAL HIGH (ref 65–99)
Glucose-Capillary: 149 mg/dL — ABNORMAL HIGH (ref 65–99)
Glucose-Capillary: 166 mg/dL — ABNORMAL HIGH (ref 65–99)
Glucose-Capillary: 173 mg/dL — ABNORMAL HIGH (ref 65–99)
Glucose-Capillary: 200 mg/dL — ABNORMAL HIGH (ref 65–99)
Glucose-Capillary: 211 mg/dL — ABNORMAL HIGH (ref 65–99)
Glucose-Capillary: 212 mg/dL — ABNORMAL HIGH (ref 65–99)
Glucose-Capillary: 334 mg/dL — ABNORMAL HIGH (ref 65–99)
Glucose-Capillary: 99 mg/dL (ref 65–99)

## 2017-09-30 LAB — HCV INTERPRETATION

## 2017-09-30 LAB — LIPID PANEL
CHOLESTEROL TOTAL: 125 mg/dL (ref 100–199)
Chol/HDL Ratio: 3.1 ratio (ref 0.0–5.0)
HDL: 40 mg/dL (ref >39)
LDL CALC: 61 mg/dL (ref 0–99)
TRIGLYCERIDES: 119 mg/dL (ref 0–149)
VLDL Cholesterol Cal: 24 mg/dL (ref 5–40)

## 2017-09-30 LAB — HCV AB W/RFLX TO VERIFICATION: HCV Ab: <0.1 s/co ratio (ref 0.0–0.9)

## 2017-09-30 LAB — TROPONIN I: Troponin I: <0.03 ng/mL (ref <0.03)

## 2017-09-30 MED ORDER — INSULIN GLARGINE 100 UNIT/ML ~~LOC~~ SOLN
30.0000 [IU] | Freq: Two times a day (BID) | SUBCUTANEOUS | Status: DC
  Administered 2017-09-30: 30 [IU] via SUBCUTANEOUS
  Filled 2017-09-30 (×2): qty 0.3

## 2017-09-30 MED ORDER — INSULIN ASPART 100 UNIT/ML ~~LOC~~ SOLN
5.0000 [IU] | Freq: Three times a day (TID) | SUBCUTANEOUS | Status: DC
  Administered 2017-09-30 – 2017-10-01 (×2): 5 [IU] via SUBCUTANEOUS

## 2017-09-30 MED ORDER — INSULIN GLARGINE 100 UNIT/ML ~~LOC~~ SOLN
35.0000 [IU] | Freq: Two times a day (BID) | SUBCUTANEOUS | Status: DC
  Administered 2017-09-30: 35 [IU] via SUBCUTANEOUS
  Filled 2017-09-30 (×3): qty 0.35

## 2017-09-30 MED ORDER — INSULIN ASPART 100 UNIT/ML ~~LOC~~ SOLN
0.0000 [IU] | Freq: Three times a day (TID) | SUBCUTANEOUS | Status: DC
  Administered 2017-09-30: 11 [IU] via SUBCUTANEOUS
  Administered 2017-09-30: 2 [IU] via SUBCUTANEOUS
  Administered 2017-10-01: 3 [IU] via SUBCUTANEOUS

## 2017-09-30 MED ORDER — INSULIN ASPART 100 UNIT/ML ~~LOC~~ SOLN: 0.0000 [IU] | Freq: Every day | SUBCUTANEOUS | Status: DC

## 2017-09-30 NOTE — Progress Notes (Signed)
PROGRESS NOTE  [name] DamesKevin J [name] ZOX:[id number] DOB: Feb 27, 1956 DOA: 09/29/2017 PCP: Sherren MochaShaw, Eva N, MD   LOS: 0 days   Brief Narrative / Interim history: 61 y.o. male with medical history significant of IDDM, poorly controlled, CAD s/p MI in July 2015 with DES to RCA, HTN, HLD, comed to the ER with complaints of weakness, epigastric pain, nausea and found to be in DKA   Assessment & Plan: Active Problems:   DM (diabetes mellitus), type 2, uncontrolled (HCC)   GERD (gastroesophageal reflux disease)   CAD (coronary artery disease)   DKA (diabetic ketoacidoses) (HCC)   AKI (acute kidney injury) (HCC)   DKA -unclear precipitant, no evidence of infectious process, CT scan abdomen and pelvis negative.  -BMP looks improved, resume home Lantus 30U BID, stop insulin infusion, allow a carb modified diet, sliding scale  CAD -no chest pain, troponin negative x 3 -continue metoprolol, effient, statin  AKI -due to dehydration / DKA, Cr has now normalized -continue to hold ACEI  HTN -continue metoprolol, hold ACEI as above, BP stable to 130/73 this morning   HLD -continue statin   DVT prophylaxis: Lovenox Code Status: Full code Family Communication: no family at bedside Disposition Plan: home 1 day if CBGs stable and able to tolerate diet  Consultants:   None   Procedures:   None   Antimicrobials:  None    Subjective: -feeling better, no longer has nausea. He is hungry and wants to eat.  Objective: Vitals:   09/29/17 2109 09/30/17 0051 09/30/17 0300 09/30/17 0800  BP: 139/77 117/67 (!) 106/48 130/73  Pulse:  77 80 77  Resp:  20 15 13   Temp:  98.6 F (37 C) 98.4 F (36.9 C) 98.4 F (36.9 C)  TempSrc:  Oral Oral Oral  SpO2:  95% 92%   Weight:      Height:        Intake/Output Summary (Last 24 hours) at 09/30/17 1110 Last data filed at 09/30/17 [id number]  Gross per 24 hour  Intake           1338.8 ml  Output             1000 ml  Net            338.8 ml   Filed  Weights   09/29/17 1848  Weight: 99.4 kg (219 lb 2.2 oz)    Examination:  Constitutional: NAD Eyes: lids and conjunctivae normal ENMT: Mucous membranes are moist. No oropharyngeal exudates Respiratory: clear to auscultation bilaterally, no wheezing, no crackles. Cardiovascular: Regular rate and rhythm, no murmurs / rubs / gallops. No LE edema. 2+ pedal pulses.  Abdomen: no tenderness. Bowel sounds positive.  Skin: no rashes, lesions, ulcers. No induration Neurologic: CN 2-12 grossly intact. Strength 5/5 in all 4.  Psychiatric: Normal judgment and insight. Alert and oriented x 3. Normal mood.    Data Reviewed: I have independently reviewed following labs and imaging studies   CBC:  Recent Labs Lab 09/24/17 0454 09/29/17 1107 09/29/17 1306  WBC 5.0 6.3 8.4  HGB 12.3* 13.5* 13.6  HCT 36.2* 39.6* 40.6  MCV 85.6 84.6 86.6  PLT 242  --  294   Basic Metabolic Panel:  Recent Labs Lab 09/29/17 1607 09/29/17 1849 09/29/17 2249 09/30/17 0251 09/30/17 0828  NA 133* 133* 135 137 137  K 4.7 4.7 3.7 4.0 3.7  CL 101 104 108 109 109  CO2 15* 17* 21* 18* 20*  GLUCOSE 534* 353* 127* 184* 165*  BUN 21* 19 14 11 10   CREATININE 1.64* 1.38* 1.02 1.08 0.94  CALCIUM 9.0 8.7* 8.3* 8.3* 8.5*   GFR: Estimated Creatinine Clearance: 100.1 mL/min (by C-G formula based on SCr of 0.94 mg/dL). Liver Function Tests:  Recent Labs Lab 09/29/17 1044 09/29/17 1059 09/29/17 1607 09/30/17 0251  AST 15 13 19  14*  ALT 12 10 14* 11*  ALKPHOS 139* 145* 137* 110  BILITOT 0.5 0.5 1.4* 1.0  PROT 6.4 6.6 6.9 5.6*  ALBUMIN 4.4 4.2 4.0 3.4*    Recent Labs Lab 09/29/17 1607  LIPASE 21   No results for input(s): AMMONIA in the last 168 hours. Coagulation Profile: No results for input(s): INR, PROTIME in the last 168 hours. Cardiac Enzymes:  Recent Labs Lab 09/29/17 1849 09/29/17 2249 09/30/17 0251  TROPONINI <0.03 <0.03 <0.03   BNP (last 3 results) No results for input(s): PROBNP in  the last 8760 hours. HbA1C:  Recent Labs  09/29/17 1043  HGBA1C 8.5   CBG:  Recent Labs Lab 09/30/17 0505 09/30/17 0603 09/30/17 0727 09/30/17 0832 09/30/17 0938  GLUCAP 212* 173* 129* 156* 158*   Lipid Profile:  Recent Labs  09/29/17 1044  CHOL 125  HDL 40  LDLCALC 61  TRIG 119  CHOLHDL 3.1   Thyroid Function Tests: No results for input(s): TSH, T4TOTAL, FREET4, T3FREE, THYROIDAB in the last 72 hours. Anemia Panel: No results for input(s): VITAMINB12, FOLATE, FERRITIN, TIBC, IRON, RETICCTPCT in the last 72 hours. Urine analysis:    Component Value Date/Time   COLORURINE STRAW (A) 09/29/2017 1312   APPEARANCEUR CLEAR 09/29/2017 1312   LABSPEC 1.026 09/29/2017 1312   PHURINE 5.0 09/29/2017 1312   GLUCOSEU >=500 (A) 09/29/2017 1312   HGBUR NEGATIVE 09/29/2017 1312   BILIRUBINUR NEGATIVE 09/29/2017 1312   BILIRUBINUR negative 09/29/2017 1041   KETONESUR 80 (A) 09/29/2017 1312   PROTEINUR NEGATIVE 09/29/2017 1312   UROBILINOGEN 0.2 09/29/2017 1041   UROBILINOGEN 0.2 06/14/2014 0809   NITRITE NEGATIVE 09/29/2017 1312   LEUKOCYTESUR NEGATIVE 09/29/2017 1312   Sepsis Labs: Invalid input(s): PROCALCITONIN, LACTICIDVEN  Recent Results (from the past 240 hour(s))  MRSA PCR Screening     Status: None   Collection Time: 09/29/17  6:54 PM  Result Value Ref Range Status   MRSA by PCR NEGATIVE NEGATIVE Final    Comment:        The GeneXpert MRSA Assay (FDA approved for NASAL specimens only), is one component of a comprehensive MRSA colonization surveillance program. It is not intended to diagnose MRSA infection nor to guide or monitor treatment for MRSA infections.       Radiology Studies: Ct Abdomen Pelvis W Contrast  Result Date: 09/29/2017 CLINICAL DATA:  61 y/o M; generalized weakness, nausea, and epigastric pain. EXAM: CT ABDOMEN AND PELVIS WITH CONTRAST TECHNIQUE: Multidetector CT imaging of the abdomen and pelvis was performed using the standard  protocol following bolus administration of intravenous contrast. CONTRAST:  ISOVUE-300 IOPAMIDOL (ISOVUE-300) INJECTION 61% COMPARISON:  05/08/2012 CT abdomen and pelvis. FINDINGS: Lower chest: Small hiatal hernia. Hepatobiliary: No focal liver abnormality is seen. No gallstones, gallbladder wall thickening, or biliary dilatation. Pancreas: Unremarkable. No pancreatic ductal dilatation or surrounding inflammatory changes. Spleen: Normal in size without focal abnormality. Adrenals/Urinary Tract: Adrenal glands are unremarkable. Kidneys are normal, without renal calculi, focal lesion, or hydronephrosis. Bladder is unremarkable. Stomach/Bowel: Stomach is within normal limits. Appendix appears normal. No evidence of bowel wall thickening, distention, or inflammatory changes. Vascular/Lymphatic: No significant vascular findings are present.  No enlarged abdominal or pelvic lymph nodes. Reproductive: Prostate is unremarkable. Other: Soft tissue thickening within the bilateral lower abdominal wall probably representing injection sites or sequelae of old trauma. Musculoskeletal: No acute or significant osseous findings. IMPRESSION: 1. Small hiatal hernia. 2. Otherwise unremarkable CT of abdomen and pelvis. Electronically Signed   By: Mitzi Hansen M.D.   On: 09/29/2017 17:41   Dg Abd Acute W/chest  Result Date: 09/29/2017 CLINICAL DATA:  Back and abdominal pain.  Diabetic ketoacidosis. EXAM: DG ABDOMEN ACUTE W/ 1V CHEST COMPARISON:  September 22, 2017. FINDINGS: Paucity of bowel gas. There is no evidence of dilated bowel loops or free intraperitoneal air. Air and stool are seen throughout the colon. No radiopaque calculi or other significant radiographic abnormality is seen. Unchanged phleboliths in the right pelvis. Heart size and mediastinal contours are within normal limits. Both lungs are clear. IMPRESSION: Negative abdominal radiographs.  No acute cardiopulmonary disease. Electronically Signed   By:  Obie Dredge M.D.   On: 09/29/2017 16:29     Scheduled Meds: . enoxaparin (LOVENOX) injection  40 mg Subcutaneous Q24H  . gabapentin  200 mg Oral TID  . insulin aspart  0-15 Units Subcutaneous TID WC  . insulin aspart  0-5 Units Subcutaneous QHS  . insulin glargine  30 Units Subcutaneous BID  . metoCLOPramide (REGLAN) injection  10 mg Intravenous Once  . metoprolol tartrate  12.5 mg Oral BID  . pantoprazole  40 mg Oral Daily  . prasugrel  10 mg Oral Daily  . rosuvastatin  40 mg Oral Daily   Continuous Infusions:   Pamella Pert, MD, PhD [facility] Pager [phone]  If 7PM-7AM, please contact night-coverage [email or web address] Password [email or web address] 09/30/2017, 11:10 AM

## 2017-09-30 NOTE — Progress Notes (Signed)
Inpatient Diabetes Program Recommendations  AACE/ADA: New Consensus Statement on Inpatient Glycemic Control (2015)  Target Ranges:  Prepandial:   less than 140 mg/dL      Peak postprandial:   less than 180 mg/dL (1-2 hours)      Critically ill patients:  140 - 180 mg/dL  Results for [name] [name], [name] [name] (MRN [id number]) as of 09/30/2017 08:03  Ref. Range 09/30/2017 00:31 09/30/2017 01:26 09/30/2017 02:37 09/30/2017 03:47 09/30/2017 05:05 09/30/2017 06:03 09/30/2017 07:27  Glucose-Capillary Latest Ref Range: 65 - 99 mg/dL 99 496 (H) 759 (H) 163 (H) 212 (H) 173 (H) 129 (H)  Results for [name] [name], [name] [name] (MRN [id number]) as of 09/30/2017 08:03  Ref. Range 09/29/2017 13:06  Glucose Latest Ref Range: 65 - 99 mg/dL [facility])  Results for [name] [name], [name] [name] (MRN [id number]) as of 09/30/2017 08:03  Ref. Range 09/29/2017 10:43  Hemoglobin A1C Unknown 8.5    Review of Glycemic Control  Diabetes history: DM2 Outpatient Diabetes medications: Lantus 34 units BID, Novolog 30-35 units TID with meals Current orders for Inpatient glycemic control: Novolin R insulin drip per DKA protocol  Inpatient Diabetes Program Recommendations:  Insulin - Basal: Once acidosis is cleared and MD is ready to transition off IV insulin to SQ insulin, please consider ordering Lantus 30 units BID. Correction (SSI): Once acidosis is cleared and MD is ready to transition off IV insulin to SQ insulin, please consider ordering Novolog 0-15 units TID with meals and Novolog 0-5 units QHS. Insulin - Meal Coverage: Once acidosis is cleared and MD is ready to transition off IV insulin to SQ insulin, please consider ordering Novolog 5 units TID with meals for meal coverage if patient eats at least 50% of meals. HgbA1C: A1C 8.5% on 09/29/17 indicating an average glucose of 197 mg/dl over the past 2-3 months.  Thanks, Orlando Penner, RN, MSN, CDE Diabetes Coordinator Inpatient Diabetes Program [phone] (Team Pager from 8am to 5pm)

## 2017-10-01 ENCOUNTER — Encounter: Payer: Self-pay | Admitting: Registered Nurse

## 2017-10-01 ENCOUNTER — Encounter: Payer: Medicare HMO | Attending: Physical Medicine & Rehabilitation | Admitting: Registered Nurse

## 2017-10-01 VITALS — BP 117/81 | HR 82

## 2017-10-01 DIAGNOSIS — M545 Low back pain, unspecified: Secondary | ICD-10-CM

## 2017-10-01 DIAGNOSIS — M799 Soft tissue disorder, unspecified: Secondary | ICD-10-CM | POA: Insufficient documentation

## 2017-10-01 DIAGNOSIS — G6289 Other specified polyneuropathies: Secondary | ICD-10-CM | POA: Diagnosis not present

## 2017-10-01 DIAGNOSIS — M25551 Pain in right hip: Secondary | ICD-10-CM

## 2017-10-01 DIAGNOSIS — M7989 Other specified soft tissue disorders: Secondary | ICD-10-CM | POA: Insufficient documentation

## 2017-10-01 DIAGNOSIS — G894 Chronic pain syndrome: Secondary | ICD-10-CM | POA: Diagnosis not present

## 2017-10-01 DIAGNOSIS — Z5181 Encounter for therapeutic drug level monitoring: Secondary | ICD-10-CM | POA: Diagnosis not present

## 2017-10-01 DIAGNOSIS — E1165 Type 2 diabetes mellitus with hyperglycemia: Secondary | ICD-10-CM | POA: Diagnosis not present

## 2017-10-01 DIAGNOSIS — R109 Unspecified abdominal pain: Secondary | ICD-10-CM | POA: Diagnosis not present

## 2017-10-01 DIAGNOSIS — E119 Type 2 diabetes mellitus without complications: Secondary | ICD-10-CM | POA: Insufficient documentation

## 2017-10-01 DIAGNOSIS — N179 Acute kidney failure, unspecified: Secondary | ICD-10-CM | POA: Diagnosis not present

## 2017-10-01 DIAGNOSIS — Z79899 Other long term (current) drug therapy: Secondary | ICD-10-CM

## 2017-10-01 DIAGNOSIS — M25552 Pain in left hip: Secondary | ICD-10-CM

## 2017-10-01 DIAGNOSIS — E1110 Type 2 diabetes mellitus with ketoacidosis without coma: Secondary | ICD-10-CM | POA: Diagnosis not present

## 2017-10-01 DIAGNOSIS — I25118 Atherosclerotic heart disease of native coronary artery with other forms of angina pectoris: Secondary | ICD-10-CM | POA: Diagnosis not present

## 2017-10-01 DIAGNOSIS — E0810 Diabetes mellitus due to underlying condition with ketoacidosis without coma: Secondary | ICD-10-CM | POA: Diagnosis not present

## 2017-10-01 LAB — GLUCOSE, CAPILLARY
GLUCOSE-CAPILLARY: 68 mg/dL (ref 65–99)
GLUCOSE-CAPILLARY: 91 mg/dL (ref 65–99)
GLUCOSE-CAPILLARY: 200 mg/dL — AB (ref 65–99)
Glucose-Capillary: 219 mg/dL — ABNORMAL HIGH (ref 65–99)

## 2017-10-01 MED ORDER — INSULIN GLARGINE 100 UNIT/ML ~~LOC~~ SOLN
30.0000 [IU] | Freq: Two times a day (BID) | SUBCUTANEOUS | Status: DC
  Administered 2017-10-01: 30 [IU] via SUBCUTANEOUS
  Filled 2017-10-01: qty 0.3

## 2017-10-01 MED ORDER — INSULIN DETEMIR 100 UNIT/ML FLEXPEN: 40.0000 [IU] | PEN_INJECTOR | Freq: Two times a day (BID) | SUBCUTANEOUS | 1 refills | Status: DC

## 2017-10-01 MED ORDER — HYDROCODONE-ACETAMINOPHEN 7.5-325 MG PO TABS: 1.0000 | ORAL_TABLET | Freq: Four times a day (QID) | ORAL | 0 refills | Status: DC | PRN

## 2017-10-01 NOTE — Care Management Note (Signed)
Case Management Note  Patient Details  Name: [name] [name] MRN: [id number] Date of Birth: Mar 28, 1956  Subjective/Objective:       Pt admitted with DKA             Action/Plan:  PTA completely independent from home with wife.  Pt has PCP and denied barriers to obtaining and paying for medications as prescribed.  NO CM need determined prior to discharge   Expected Discharge Date:                  Expected Discharge Plan:     In-House Referral:     Discharge planning Services     Post Acute Care Choice:    Choice offered to:     DME Arranged:    DME Agency:     HH Arranged:    HH Agency:     Status of Service:     If discussed at [employer], dates discussed:    Additional Comments:  Cherylann Parr, RN 10/01/2017, 11:53 AM

## 2017-10-01 NOTE — Care Management Note (Signed)
Case Management Note  Patient Details  Name: [name] [name] MRN: [id number] Date of Birth: Apr 09, 1956  Subjective/Objective:       Pt admitted with DKA             Action/Plan:  PTA completely independent from home with wife.  Pt has PCP and denied barriers to obtaining and paying for medications as prescribed.  NO CM need determined at the time this note was written  Expected Discharge Date:                  Expected Discharge Plan:     In-House Referral:     Discharge planning Services     Post Acute Care Choice:    Choice offered to:     DME Arranged:    DME Agency:     HH Arranged:    HH Agency:     Status of Service:     If discussed at [employer], dates discussed:    Additional Comments:  Cherylann Parr, RN 10/01/2017, 11:53 AM

## 2017-10-01 NOTE — Progress Notes (Signed)
Discharge instructions given to patient, all questions answered at this time.  RN reviewed importance of alternating injections sites for insulin. Pt. VSS with no s/s of distress noted.  Patient stable at discharge.

## 2017-10-01 NOTE — Progress Notes (Signed)
Subjective:    Patient ID: [name] [name], male    DOB: 04/13/1956, 61 y.o.   MRN: [id number]  HPI:  [name] [name] is a 61 year old male who returns for follow up appointment for chronic pain and medication refill. He states his pain is located in his  lower back and bilateral hips.Also reports increase intensity of bilateral hip pain. He rates his pain 8.His current exercise regime is walking.  [name] [name] Morphine equivalent is 20.00 MME.   [name] [name] was hospitalized twice since his last office visit.  Hospitalized at [facility] 09/22/2017 and Discharged 09/24/2017 for,AKI and N&V  notes reviewed.  Hospitalized at [facility] on 09/29/2017 and Discharged 10/01/2017 for Diabetic Ketoacidosis. While hospitalized [name] [name] was receiving Hydrocodone 10 mg, we will prescribe Hydrocodone 7.5 mg and re-evaluate next month. Instructed to call office in two weeks to evaluate medication regime. Discussed Nucynta, will discuss with Dr. Riley Kill he verbalizes understanding.   [name] [name] last UDS was 05/31/2017 it was consistent.   Pain Inventory Average Pain 2 Pain Right Now 8 My pain is constant, dull and aching  In the last 24 hours, has pain interfered with the following? General activity 6 Relation with others 3 Enjoyment of life 3 What TIME of day is your pain at its worst? night Sleep (in general) Poor  Pain is worse with: sitting, inactivity and standing Pain improves with: rest, medication and injections Relief from Meds: 6  Mobility walk without assistance how many minutes can you walk? 10 ability to climb steps?  yes do you drive?  yes  Function disabled: date disabled 2014 I need assistance with the following:  .  Neuro/Psych weakness numbness tingling spasms depression  Prior Studies Any changes since last visit?  no  Physicians involved in your care Any changes since last visit?  no   Family History  Problem Relation Age of Onset  . Hypertension Mother     . Alzheimer's disease Mother   . Alcohol abuse Father   . Cancer Father        "Throat"  . Diabetes type II Brother   . Heart disease Neg Hx    Social History   Social History  . Marital status: Married    Spouse name: N/A  . Number of children: N/A  . Years of education: N/A   Occupational History  . unemployed     applying for disability   Social History Main Topics  . Smoking status: Former Smoker    Packs/day: 0.25    Years: 3.00    Quit date: 11/30/1998  . Smokeless tobacco: Never Used  . Alcohol use No  . Drug use: No  . Sexual activity: Yes    Birth control/ protection: None   Other Topics Concern  . Not on file   Social History Narrative  . No narrative on file   Past Surgical History:  Procedure Laterality Date  . LEFT HEART CATHETERIZATION WITH CORONARY ANGIOGRAM N/A 06/14/2014   Procedure: LEFT HEART CATHETERIZATION WITH CORONARY ANGIOGRAM;  Surgeon: Corky Crafts, MD;  Location: [facility] CATH LAB;  Service: Cardiovascular;  Laterality: N/A;  . TONSILLECTOMY     Past Medical History:  Diagnosis Date  . Barrett's esophagus   . CAD (coronary artery disease)    a. NSTEMI 05/2014 - occluded RCA dominant proximal s/p asp-thrombectomy/DES to RCA, minimal LAD/LCx, EF 50% by cath, 65-70% by echo.  . Depression   . Diabetes type 2, uncontrolled (  HCC)   . Former tobacco use   . GERD (gastroesophageal reflux disease)   . Hemorrhoids    hx of  . Hypercholesterolemia   . Hypertension   . MI (myocardial infarction) (HCC)   . Peripheral neuropathy    There were no vitals taken for this visit.  Opioid Risk Score:  1 Fall Risk Score:  `1  Depression screen PHQ 2/9  [facility] 10/01/2017 09/29/2017 03/08/2017 02/08/2017 09/15/2016 08/25/2016 08/18/2016  Decreased Interest 0 0 0 0 0 0 0  Down, Depressed, Hopeless 0 0 0 0 0 0 0  PHQ - 2 Score 0 0 0 0 0 0 0  Altered sleeping - - - - - - 3  Tired, decreased energy - - - - - - 2  Change in appetite -  - - - - - 2  Feeling bad or failure about yourself  - - - - - - 0  Trouble concentrating - - - - - - 1  Moving slowly or fidgety/restless - - - - - - 0  Suicidal thoughts - - - - - - 0  PHQ-9 Score - - - - - - 8  Difficult doing work/chores - - - - - - Not difficult at all    Review of Systems  Constitutional: Negative.   HENT: Negative.   Eyes: Negative.   Respiratory: Negative.   Cardiovascular: Negative.   Gastrointestinal: Negative.   Endocrine: Negative.   Genitourinary: Negative.   Musculoskeletal: Negative.   Skin: Negative.   Allergic/Immunologic: Negative.   Neurological: Negative.   Hematological: Negative.   Psychiatric/Behavioral: Positive for dysphoric mood. The patient is nervous/anxious.   All other systems reviewed and are negative.      Objective:   Physical Exam  Constitutional: He is oriented to person, place, and time. He appears well-developed and well-nourished.  HENT:  Head: Normocephalic and atraumatic.  Neck: Normal range of motion. Neck supple.  Cardiovascular: Normal rate and regular rhythm.   Pulmonary/Chest: Effort normal and breath sounds normal.  Musculoskeletal:  Normal Muscle Bulk and Muscle Testing Reveals: Upper Extremities: Full ROM and Muscle Strength 5/5 Lumbar Hypersensitivity Bilateral Greater Trochanter Tenderness: L>R Lower Extremities: Full ROM and Muscle Strength 5/5 Left Lower Extremity Flexion Produces Pain into Left Hip Arises from Table with ease Narrow Based gait  Neurological: He is alert and oriented to person, place, and time.  Skin: Skin is warm and dry.  Psychiatric: He has a normal mood and affect.  Nursing note and vitals reviewed.         Assessment & Plan:  1.Bilateral hip pain. 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 Ortho Following: Dr. Thurston HoleWainer regarding right hip placement. 10/01/2017 Refilled: Increase Hydrocodone 7.5/325 mg One tablet every 6 hours as needed #120. We  will continue the opioid monitoring program, this consists of regular clinic visits, examinations, urine drug screen, pill counts as well as use of [place] Cont[place] Reporting System.  2. Cervicalgia: Ortho Following: No complaints today. Continue with HEP as tolerated. Continue to Monitor. 10/01/2017 3. Type II DM: Polyneuropathy:Continue Gabapentin: 10/01/2017   20 minutes of face to face patient care time was spent during this visit. All questions were encouraged and answered.  F/U in 1 month

## 2017-10-01 NOTE — Discharge Summary (Signed)
Physician Discharge Summary  [name] [name] ZOX:[id number] DOB: [birth year] DOA: 09/29/2017  PCP: Shawnee Knapp, MD  Admit date: 09/29/2017 Discharge date: 10/01/2017  Admitted From: home Disposition:  home  Recommendations for Outpatient Follow-up:  1. Follow up with PCP in 1-2 weeks  Home Health: none  Equipment/Devices: none  Discharge Condition: stable CODE STATUS: Full code Diet recommendation: diabetic   HPI: [name] [name] is a 61 y.o. male with medical history significant of IDDM, poorly controlled, CAD s/p MI in July 2015 with DES to RCA, HTN, HLD, comed to the ER with complaints as above. He was recently hospitalized and d/c 5 days ago for a probable viral gastroenteritis, had AKI, and improved with fluids and was able to tolerate a diet on d/c.  He felt well at home, however over the last couple of days he has been feeling worsening abdominal pain which is mid epigastric towards the right side, nausea but no vomiting.  He denies any fever at home but has had chills on couple occasions.  He feels like his viral gastroenteritis has now resolved.  He denies any chest pain.  He had a history of MI in 2015 and he never had chest pain with that but did describe back pain.  He currently does not have any back pain however when he was hospitalized last week he has been having back pain which reminded him of his prior heart attack.  He denies any shortness of breath.  He denies any lightheadedness but feels overall quite weak. ED Course: in the ED his vitals are stable, he is afebrile, normotensive, bloodwork shows elevated CBG to 700 range, bicarb of 15, anion gap of 19. He was started on insulin infusion and fluids and we were asked to admit.  Hospital Course: Discharge Diagnoses:  Active Problems:   DM (diabetes mellitus), type 2, uncontrolled (HCC)   GERD (gastroesophageal reflux disease)   CAD (coronary artery disease)   DKA (diabetic ketoacidoses) (HCC)   AKI (acute kidney injury)  ([place])   DKA -patient was admitted to the hospital with DKA, precipitant for this was unclear.  He had no evidence of infectious process, afebrile, and without leukocytosis.  He was admitted to stepdown, placed on DKA protocol with IV insulin infusion, IV fluids, his gap corrected and he was transitioned to subcutaneous insulin and remained stable on that regimen.  He is tolerating diet. I am concerned about patient's compliance at home, however he denies that.  Nurses noticed that he may use only one site of his abdomen for all his insulin injections, and he was counseled about changing the site of injections. CAD -no chest pain, troponin negative x 3, continue home regimen AKI -due to dehydration / DKA, Cr has now normalized HTN -continue home regimen HLD -continue statin  Discharge Instructions   Allergies as of 10/01/2017   No Known Allergies     Medication List    TAKE these medications   ABSORBASE EX Apply 1 application topically daily as needed (MUSCLE ACHES).   blood glucose meter kit and supplies Kit Dispense based on patient and insurance preference. Use four times daily as directed. ICD10 E11.65, Z79.4   DELSYM COUGH/COLD DAYTIME PO Take 15 mLs by mouth daily as needed (COUGH).   EQ ASPIRIN ADULT LOW DOSE 81 MG EC tablet Generic drug:  aspirin TAKE ONE TABLET BY MOUTH ONCE DAILY   gabapentin 100 MG capsule Commonly known as:  NEURONTIN Take Two Capsules in the Morning, Afternoon  and Bedtime What changed:  how much to take  how to take this  when to take this  additional instructions   HYDROcodone-acetaminophen 5-325 MG tablet Commonly known as:  NORCO/VICODIN Take 1 tablet by mouth every 6 (six) hours as needed for moderate pain (pain).   insulin aspart 100 UNIT/ML FlexPen Commonly known as:  NOVOLOG FLEXPEN INJECT 30 TO 35 UNITS SUBCUTANEOUSLY THREE TIMES DAILY What changed:  how much to take  how to take this  when to take this  additional  instructions   Insulin Pen Needle 31G X 5 MM Misc Commonly known as:  B-D UF III MINI PEN NEEDLES Use to inject insulin 5 pen needles per day   LANTUS SOLOSTAR 100 UNIT/ML Solostar Pen Generic drug:  Insulin Glargine Inject 34 Units into the skin 2 (two) times daily.   lisinopril 2.5 MG tablet Commonly known as:  PRINIVIL,ZESTRIL Take 1 tablet (2.5 mg total) by mouth daily.   metoprolol tartrate 25 MG tablet Commonly known as:  LOPRESSOR Take 0.5 tablets (12.5 mg total) by mouth 2 (two) times daily.   nitroGLYCERIN 0.4 MG SL tablet Commonly known as:  NITROSTAT Place 1 tablet (0.4 mg total) under the tongue every 5 (five) minutes as needed for chest pain (up to 3 doses).   pantoprazole 40 MG tablet Commonly known as:  PROTONIX Take 1 tablet (40 mg total) by mouth daily.   prasugrel 10 MG Tabs tablet Commonly known as:  EFFIENT Take 1 tablet (10 mg total) by mouth daily.   rosuvastatin 40 MG tablet Commonly known as:  CRESTOR Take 1 tablet (40 mg total) by mouth daily.      Follow-up Information    Shawnee Knapp, MD. Schedule an appointment as soon as possible for a visit in 1 week(s).   Specialty:  Family Medicine Contact information: [place] [address] [phone]           Consultations:  None   Procedures/Studies:  Ct Abdomen Pelvis W Contrast  Result Date: 09/29/2017 CLINICAL DATA:  61 y/o M; generalized weakness, nausea, and epigastric pain. EXAM: CT ABDOMEN AND PELVIS WITH CONTRAST TECHNIQUE: Multidetector CT imaging of the abdomen and pelvis was performed using the standard protocol following bolus administration of intravenous contrast. CONTRAST:  164m ISOVUE-300 IOPAMIDOL (ISOVUE-300) INJECTION 61% COMPARISON:  05/08/2012 CT abdomen and pelvis. FINDINGS: Lower chest: Small hiatal hernia. Hepatobiliary: No focal liver abnormality is seen. No gallstones, gallbladder wall thickening, or biliary dilatation. Pancreas: Unremarkable. No  pancreatic ductal dilatation or surrounding inflammatory changes. Spleen: Normal in size without focal abnormality. Adrenals/Urinary Tract: Adrenal glands are unremarkable. Kidneys are normal, without renal calculi, focal lesion, or hydronephrosis. Bladder is unremarkable. Stomach/Bowel: Stomach is within normal limits. Appendix appears normal. No evidence of bowel wall thickening, distention, or inflammatory changes. Vascular/Lymphatic: No significant vascular findings are present. No enlarged abdominal or pelvic lymph nodes. Reproductive: Prostate is unremarkable. Other: Soft tissue thickening within the bilateral lower abdominal wall probably representing injection sites or sequelae of old trauma. Musculoskeletal: No acute or significant osseous findings. IMPRESSION: 1. Small hiatal hernia. 2. Otherwise unremarkable CT of abdomen and pelvis. Electronically Signed   By: LKristine GarbeM.D.   On: 09/29/2017 17:41   Dg Abd Acute W/chest  Result Date: 09/29/2017 CLINICAL DATA:  Back and abdominal pain.  Diabetic ketoacidosis. EXAM: DG ABDOMEN ACUTE W/ 1V CHEST COMPARISON:  September 22, 2017. FINDINGS: Paucity of bowel gas. There is no evidence of dilated bowel loops or free  intraperitoneal air. Air and stool are seen throughout the colon. No radiopaque calculi or other significant radiographic abnormality is seen. Unchanged phleboliths in the right pelvis. Heart size and mediastinal contours are within normal limits. Both lungs are clear. IMPRESSION: Negative abdominal radiographs.  No acute cardiopulmonary disease. Electronically Signed   By: Titus Dubin M.D.   On: 09/29/2017 16:29   Dg Abd Acute W/chest  Result Date: 09/22/2017 CLINICAL DATA:  61 year old male with history of right-sided abdominal pain over the past several weeks. Severe dizziness. EXAM: DG ABDOMEN ACUTE W/ 1V CHEST COMPARISON:  Chest x-ray 04/12/2016. No prior abdominal radiographs. CT the abdomen pelvis 05/08/2014.  FINDINGS: Lung volumes are normal. No consolidative airspace disease. No pleural effusions. No pneumothorax. No pulmonary nodule or mass noted. Pulmonary vasculature and the cardiomediastinal silhouette are within normal limits. Supine and left lateral decubitus views of the abdomen demonstrate a paucity of bowel gas, however, some gas and stool are noted throughout the colon and distal rectum. No pathologic dilatation of small bowel. No evidence of pneumoperitoneum. Large calcification in the low right pelvis corresponds to a phlebolith on prior CT examination. IMPRESSION: 1.  Nonobstructive bowel gas pattern. 2. No pneumoperitoneum. 3. No radiographic evidence of acute cardiopulmonary disease. Electronically Signed   By: Vinnie Langton M.D.   On: 09/22/2017 15:04      Subjective: - no chest pain, shortness of breath, no abdominal pain, nausea or vomiting.   Discharge Exam: Vitals:   10/01/17 1309 10/01/17 1313  BP:  (!) 140/91  Pulse:  83  Resp:  16  Temp: 98.1 F (36.7 C)   SpO2:      General: Pt is alert, awake, not in acute distress Cardiovascular: RRR, S1/S2 +, no rubs, no gallops Respiratory: CTA bilaterally, no wheezing, no rhonchi Abdominal: Soft, NT, ND, bowel sounds + Extremities: no edema, no cyanosis    The results of significant diagnostics from this hospitalization (including imaging, microbiology, ancillary and laboratory) are listed below for reference.     Microbiology: Recent Results (from the past 240 hour(s))  MRSA PCR Screening     Status: None   Collection Time: 09/29/17  6:54 PM  Result Value Ref Range Status   MRSA by PCR NEGATIVE NEGATIVE Final    Comment:        The GeneXpert MRSA Assay (FDA approved for NASAL specimens only), is one component of a comprehensive MRSA colonization surveillance program. It is not intended to diagnose MRSA infection nor to guide or monitor treatment for MRSA infections.      Labs: BNP (last 3 results) No  results for input(s): BNP in the last 8760 hours. Basic Metabolic Panel:  Recent Labs Lab 09/29/17 2249 09/30/17 0251 09/30/17 0828 09/30/17 1519 09/30/17 1951  NA 135 137 137 135 135  K 3.7 4.0 3.7 4.0 3.7  CL 108 109 109 107 107  CO2 21* 18* 20* 19* 20*  GLUCOSE 127* 184* 165* 275* 248*  BUN 14 11 10 8 11   CREATININE 1.02 1.08 0.94 1.01 1.04  CALCIUM 8.3* 8.3* 8.5* 8.4* 8.4*   Liver Function Tests:  Recent Labs Lab 09/29/17 1044 09/29/17 1059 09/29/17 1607 09/30/17 0251  AST 15 13 19  14*  ALT 12 10 14* 11*  ALKPHOS 139* 145* 137* 110  BILITOT 0.5 0.5 1.4* 1.0  PROT 6.4 6.6 6.9 5.6*  ALBUMIN 4.4 4.2 4.0 3.4*    Recent Labs Lab 09/29/17 1607  LIPASE 21   No results for input(s): AMMONIA in  the last 168 hours. CBC:  Recent Labs Lab 09/29/17 1107 09/29/17 1306  WBC 6.3 8.4  HGB 13.5* 13.6  HCT 39.6* 40.6  MCV 84.6 86.6  PLT  --  294   Cardiac Enzymes:  Recent Labs Lab 09/29/17 1849 09/29/17 2249 09/30/17 0251  TROPONINI <0.03 <0.03 <0.03   BNP: Invalid input(s): POCBNP CBG:  Recent Labs Lab 09/30/17 2100 10/01/17 0744 10/01/17 0813 10/01/17 1215 10/01/17 1344  GLUCAP 200* 68 91 219* 200*   D-Dimer No results for input(s): DDIMER in the last 72 hours. Hgb A1c  Recent Labs  09/29/17 1043  HGBA1C 8.5   Lipid Profile  Recent Labs  09/29/17 1044  CHOL 125  HDL 40  LDLCALC 61  TRIG 119  CHOLHDL 3.1   Thyroid function studies No results for input(s): TSH, T4TOTAL, T3FREE, THYROIDAB in the last 72 hours.  Invalid input(s): FREET3 Anemia work up No results for input(s): VITAMINB12, FOLATE, FERRITIN, TIBC, IRON, RETICCTPCT in the last 72 hours. Urinalysis    Component Value Date/Time   COLORURINE STRAW (A) 09/29/2017 1312   APPEARANCEUR CLEAR 09/29/2017 1312   LABSPEC 1.026 09/29/2017 1312   PHURINE 5.0 09/29/2017 1312   GLUCOSEU >=500 (A) 09/29/2017 1312   HGBUR NEGATIVE 09/29/2017 1312   BILIRUBINUR NEGATIVE  09/29/2017 1312   BILIRUBINUR negative 09/29/2017 1041   KETONESUR 80 (A) 09/29/2017 1312   PROTEINUR NEGATIVE 09/29/2017 1312   UROBILINOGEN 0.2 09/29/2017 1041   UROBILINOGEN 0.2 06/14/2014 0809   NITRITE NEGATIVE 09/29/2017 1312   LEUKOCYTESUR NEGATIVE 09/29/2017 1312   Sepsis Labs Invalid input(s): PROCALCITONIN,  WBC,  LACTICIDVEN   Time coordinating discharge: 35 minutes  SIGNED:  Marzetta Board, MD  [facility] 10/01/2017, 2:52 PM Pager [phone]  If 7PM-7AM, please contact night-coverage [email or web address] Password [email or web address]

## 2017-10-01 NOTE — Progress Notes (Signed)
Hypoglycemic Event  CBG: 68  Treatment: 4oz of apple juice  Symptoms: asymptomatic  Follow-up CBG: Time:0813 CBG Result:91  Possible Reasons for Event: adjusting patients insulin  Comments/MD notified:Dr. Elvera Lennox notified in person long acting insulin adjusted.    Wende Crease

## 2017-10-01 NOTE — Discharge Instructions (Addendum)
Diabetic Ketoacidosis Diabetic ketoacidosis is a life-threatening complication of diabetes. If it is not treated, it can cause severe dehydration and organ damage and can lead to a coma or death. What are the causes? This condition develops when there is not enough of the hormone insulin in the body. Insulin helps the body to break down sugar for energy. Without insulin, the body cannot break down sugar, so it breaks down fats instead. This leads to the production of acids that are called ketones. Ketones are poisonous at high levels. This condition can be triggered by:  Stress on the body that is brought on by an illness.  Medicines that raise blood glucose levels.  Not taking diabetes medicine.  What are the signs or symptoms? Symptoms of this condition include:  Fatigue.  Weight loss.  Excessive thirst.  Light-headedness.  Fruity or sweet-smelling breath.  Excessive urination.  Vision changes.  Confusion or irritability.  Nausea.  Vomiting.  Rapid breathing.  Abdominal pain.  Feeling flushed.  How is this diagnosed? This condition is diagnosed based on a medical history, a physical exam, and blood tests. You may also have a urine test that checks for ketones. How is this treated? This condition may be treated with:  Fluid replacement. This may be done to correct dehydration.  Insulin injections. These may be given through the skin or through an IV tube.  Electrolyte replacement. Electrolytes, such as potassium and sodium, may be given in pill form or through an IV tube.  Antibiotic medicines. These may be prescribed if your condition was caused by an infection.  Follow these instructions at home: Eating and drinking  Drink enough fluids to keep your urine clear or pale yellow.  If you cannot eat, alternate between drinking fluids with sugar (such as juice) and salty fluids (such as broth or bouillon).  If you can eat, follow your usual diet and  drink sugar-free liquids, such as water. Other Instructions   Take insulin as directed by your health care provider. Do not skip insulin injections. Do not use expired insulin.  If your blood sugar is over 240 mg/dL, monitor your urine ketones every 4-6 hours.  If you were prescribed an antibiotic medicine, finish all of it even if you start to feel better.  Rest and exercise only as directed by your health care provider.  If you get sick, call your health care provider and begin treatment quickly. Your body often needs extra insulin to fight an illness.  Check your blood glucose levels regularly. If your blood glucose is high, drink plenty of fluids. This helps to flush out ketones. Contact a health care provider if:  Your blood glucose level is too high or too low.  You have ketones in your urine.  You have a fever.  You cannot eat.  You cannot tolerate fluids.  You have been vomiting for more than 2 hours.  You continue to have symptoms of this condition.  You develop new symptoms. Get help right away if:  Your blood glucose levels continue to be high (elevated).  Your monitor reads high even when you are taking insulin.  You faint.  You have chest pain.  You have trouble breathing.  You have a sudden, severe headache.  You have sudden weakness in one arm or one leg.  You have sudden trouble speaking or swallowing.  You have vomiting or diarrhea that gets worse after 3 hours.  You feel severely fatigued.  You have trouble thinking.  You have abdominal pain.  You are severely dehydrated. Symptoms of severe dehydration include: ? Extreme thirst. ? Dry mouth. ? Blue lips. ? Cold hands and feet. ? Rapid breathing. This information is not intended to replace advice given to you by your health care provider. Make sure you discuss any questions you have with your health care provider. Document Released: 11/13/2000 Document Revised: 04/23/2016 Document  Reviewed: 10/24/2014 Elsevier Interactive Patient Education  2017 [employer] Follow with Sherren Mocha, MD in 5-7 days  Please get a complete blood count and chemistry panel checked by your Primary MD at your next visit, and again as instructed by your Primary MD. Please get your medications reviewed and adjusted by your Primary MD.  Please request your Primary MD to go over all Hospital Tests and Procedure/Radiological results at the follow up, please get all Hospital records sent to your Prim MD by signing hospital release before you go home.  If you had Pneumonia of Lung problems at the Hospital: Please get a 2 view Chest X ray done in 6-8 weeks after hospital discharge or sooner if instructed by your Primary MD.  If you have Congestive Heart Failure: Please call your Cardiologist or Primary MD anytime you have any of the following symptoms:  1) 3 pound weight gain in 24 hours or 5 pounds in 1 week  2) shortness of breath, with or without a dry hacking cough  3) swelling in the hands, feet or stomach  4) if you have to sleep on extra pillows at night in order to breathe  Follow cardiac low salt diet and 1.5 lit/day fluid restriction.  If you have diabetes Accuchecks 4 times/day, Once in AM empty stomach and then before each meal. Log in all results and show them to your primary doctor at your next visit. If any glucose reading is under 80 or above 300 call your primary MD immediately.  If you have Seizure/Convulsions/Epilepsy: Please do not drive, operate heavy machinery, participate in activities at heights or participate in high speed sports until you have seen by Primary MD or a Neurologist and advised to do so again.  If you had Gastrointestinal Bleeding: Please ask your Primary MD to check a complete blood count within one week of discharge or at your next visit. Your endoscopic/colonoscopic biopsies that are pending at the time of discharge, will also need to followed by your  Primary MD.  Get Medicines reviewed and adjusted. Please take all your medications with you for your next visit with your Primary MD  Please request your Primary MD to go over all hospital tests and procedure/radiological results at the follow up, please ask your Primary MD to get all Hospital records sent to his/her office.  If you experience worsening of your admission symptoms, develop shortness of breath, life threatening emergency, suicidal or homicidal thoughts you must seek medical attention immediately by calling 911 or calling your MD immediately  if symptoms less severe.  You must read complete instructions/literature along with all the possible adverse reactions/side effects for all the Medicines you take and that have been prescribed to you. Take any new Medicines after you have completely understood and accpet all the possible adverse reactions/side effects.   Do not drive or operate heavy machinery when taking Pain medications.   Do not take more than prescribed Pain, Sleep and Anxiety Medications  Special Instructions: If you have smoked or chewed Tobacco  in the last 2 yrs please stop smoking, stop any  regular Alcohol  and or any Recreational drug use.  Wear Seat belts while driving.  Please note You were cared for by a hospitalist during your hospital stay. If you have any questions about your discharge medications or the care you received while you were in the hospital after you are discharged, you can call the unit and asked to speak with the hospitalist on call if the hospitalist that took care of you is not available. Once you are discharged, your primary care physician will handle any further medical issues. Please note that NO REFILLS for any discharge medications will be authorized once you are discharged, as it is imperative that you return to your primary care physician (or establish a relationship with a primary care physician if you do not have one) for your aftercare  needs so that they can reassess your need for medications and monitor your lab values.  You can reach the hospitalist office at phone [phone] or fax [phone]   If you do not have a primary care physician, you can call [phone] for a physician referral.  Activity: As tolerated with Full fall precautions use walker/cane & assistance as needed  Diet: diabetic  Disposition Home

## 2017-10-18 ENCOUNTER — Ambulatory Visit: Payer: Medicare HMO | Admitting: Family Medicine

## 2017-10-28 ENCOUNTER — Encounter: Payer: Self-pay | Admitting: Registered Nurse

## 2017-10-28 ENCOUNTER — Encounter (HOSPITAL_BASED_OUTPATIENT_CLINIC_OR_DEPARTMENT_OTHER): Payer: Medicare HMO | Admitting: Registered Nurse

## 2017-10-28 ENCOUNTER — Telehealth: Payer: Self-pay | Admitting: Registered Nurse

## 2017-10-28 VITALS — BP 114/70 | HR 87

## 2017-10-28 DIAGNOSIS — M25551 Pain in right hip: Secondary | ICD-10-CM

## 2017-10-28 DIAGNOSIS — E119 Type 2 diabetes mellitus without complications: Secondary | ICD-10-CM | POA: Diagnosis not present

## 2017-10-28 DIAGNOSIS — Z5181 Encounter for therapeutic drug level monitoring: Secondary | ICD-10-CM | POA: Diagnosis not present

## 2017-10-28 DIAGNOSIS — Z79899 Other long term (current) drug therapy: Secondary | ICD-10-CM | POA: Diagnosis not present

## 2017-10-28 DIAGNOSIS — G894 Chronic pain syndrome: Secondary | ICD-10-CM | POA: Diagnosis not present

## 2017-10-28 DIAGNOSIS — M799 Soft tissue disorder, unspecified: Secondary | ICD-10-CM | POA: Diagnosis not present

## 2017-10-28 DIAGNOSIS — M5416 Radiculopathy, lumbar region: Secondary | ICD-10-CM

## 2017-10-28 DIAGNOSIS — M25552 Pain in left hip: Secondary | ICD-10-CM | POA: Diagnosis not present

## 2017-10-28 DIAGNOSIS — M7989 Other specified soft tissue disorders: Secondary | ICD-10-CM | POA: Diagnosis not present

## 2017-10-28 DIAGNOSIS — G6289 Other specified polyneuropathies: Secondary | ICD-10-CM

## 2017-10-28 MED ORDER — HYDROCODONE-ACETAMINOPHEN 7.5-325 MG PO TABS: 1.0000 | ORAL_TABLET | Freq: Four times a day (QID) | ORAL | 0 refills | Status: DC | PRN

## 2017-10-28 NOTE — Progress Notes (Signed)
Subjective:    Patient ID: [name] [name], male    DOB: [birth year], 61 y.o.   MRN: [id number]  HPI:  [name] [name] is a 61 year old male who returns for follow up appointment for chronic pain and medication refill. He states his pain is located in his  lower back radiating into his bilateral hips and right lower extremity. He rates his pain 7.His current exercise regime is walking.  [name] [name] equivalent is 30.00 MME.   [name] last UDS was 05/31/2017 it was consistent.   Pain Inventory Average Pain 2 Pain Right Now 7 My pain is constant, stabbing and aching  In the last 24 hours, has pain interfered with the following? General activity 6 Relation with others 5 Enjoyment of life 7 What TIME of day is your pain at its worst? night Sleep (in general) Fair  Pain is worse with: sitting and inactivity Pain improves with: heat/ice, medication and injections Relief from Meds: 8  Mobility walk without assistance how many minutes can you walk? 10 ability to climb steps?  yes do you drive?  yes  Function disabled: date disabled 2014 I need assistance with the following:  .  Neuro/Psych weakness numbness spasms depression  Prior Studies Any changes since last visit?  no  Physicians involved in your care Any changes since last visit?  no   Family History  Problem Relation Age of Onset  . Hypertension Mother   . Alzheimer's disease Mother   . Alcohol abuse Father   . Cancer Father        "Throat"  . Diabetes type II Brother   . Heart disease Neg Hx    Social History   Socioeconomic History  . Marital status: Married    Spouse name: Not on file  . Number of children: Not on file  . Years of education: Not on file  . Highest education level: Not on file  Social Needs  . Financial resource strain: Not on file  . Food insecurity - worry: Not on file  . Food insecurity - inability: Not on file  . Transportation needs - medical: Not on file  .  Transportation needs - non-medical: Not on file  Occupational History  . Occupation: unemployed    Comment: applying for disability  Tobacco Use  . Smoking status: Former Smoker    Packs/day: 0.25    Years: 3.00    Pack years: 0.75    Last attempt to quit: 11/30/1998    Years since quitting: 18.9  . Smokeless tobacco: Never Used  Substance and Sexual Activity  . Alcohol use: No    Alcohol/week: 0.5 oz    Types: 1 Standard drinks or equivalent per week  . Drug use: No  . Sexual activity: Yes    Birth control/protection: None  Other Topics Concern  . Not on file  Social History Narrative  . Not on file   Past Surgical History:  Procedure Laterality Date  . LEFT HEART CATHETERIZATION WITH CORONARY ANGIOGRAM N/A 06/14/2014   Procedure: LEFT HEART CATHETERIZATION WITH CORONARY ANGIOGRAM;  Surgeon: Corky CraftsJayadeep S Varanasi, MD;  [facility];  Service: Cardiovascular;  Laterality: N/A;  . TONSILLECTOMY     Past Medical History:  Diagnosis Date  . Barrett's esophagus   . CAD (coronary artery disease)    a. NSTEMI 05/2014 - occluded RCA dominant proximal s/p asp-thrombectomy/DES to RCA, minimal LAD/LCx, EF 50% by cath, 65-70% by echo.  . Depression   .  Diabetes type 2, uncontrolled (HCC)   . Former tobacco use   . GERD (gastroesophageal reflux disease)   . Hemorrhoids    hx of  . Hypercholesterolemia   . Hypertension   . MI (myocardial infarction) (HCC)   . Peripheral neuropathy    There were no vitals taken for this visit.  Opioid Risk Score:  1 Fall Risk Score:  `1  Depression screen PHQ 2/9  Depression screen [facility] 2/9 10/01/2017 09/29/2017 03/08/2017 02/08/2017 09/15/2016 08/25/2016 08/18/2016  Decreased Interest 0 0 0 0 0 0 0  Down, Depressed, Hopeless 0 0 0 0 0 0 0  PHQ - 2 Score 0 0 0 0 0 0 0  Altered sleeping - - - - - - 3  Tired, decreased energy - - - - - - 2  Change in appetite - - - - - - 2  Feeling bad or failure about yourself  - - - - - - 0  Trouble  concentrating - - - - - - 1  Moving slowly or fidgety/restless - - - - - - 0  Suicidal thoughts - - - - - - 0  PHQ-9 Score - - - - - - 8  Difficult doing work/chores - - - - - - Not difficult at all    Review of Systems  Constitutional: Negative.   HENT: Negative.   Eyes: Negative.   Respiratory: Negative.   Cardiovascular: Negative.   Gastrointestinal: Negative.   Endocrine: Negative.   Genitourinary: Negative.   Musculoskeletal: Negative.   Skin: Negative.   Allergic/Immunologic: Negative.   Neurological: Negative.   Hematological: Negative.   Psychiatric/Behavioral: Positive for dysphoric mood.  All other systems reviewed and are negative.      Objective:   Physical Exam  Constitutional: He is oriented to person, place, and time. He appears well-developed and well-nourished.  HENT:  Head: Normocephalic and atraumatic.  Neck: Normal range of motion. Neck supple.  Cardiovascular: Normal rate and regular rhythm.  Pulmonary/Chest: Effort normal and breath sounds normal.  Musculoskeletal:  Normal Muscle Bulk and Muscle Testing Reveals: Upper Extremities: Full ROM and Muscle Strength 5/5 Lumbar Paraspinal Tenderness: L-3-L-5 Bilateral Greater Trochanter Tenderness: L>R Lower Extremities: Full ROM and Muscle Strength 5/5 Arises from Table with ease Narrow Based gait  Neurological: He is alert and oriented to person, place, and time.  Skin: Skin is warm and dry.  Psychiatric: He has a normal mood and affect.  Nursing note and vitals reviewed.         Assessment & Plan:  1.Bilateral hip pain. 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 Ortho Following: Dr. Thurston Hole regarding right hip placement. 10/28/2017 Refilled: Hydrocodone 7.5/325 mg One tablet every 6 hours as needed #120. We will continue the opioid monitoring program, this consists of regular clinic visits, examinations, urine drug screen, pill counts as well as use of [place]  Controlled Substance Reporting System.  2. Cervicalgia: Ortho Following: No complaints today. Continue with HEP as tolerated. Continue to Monitor. 10/28/2017 3. Type II DM: Polyneuropathy:Continue Gabapentin: 10/28/2017   20 minutes of face to face patient care time was spent during this visit. All questions were encouraged and answered.  F/U in 1 month

## 2017-10-28 NOTE — Telephone Encounter (Signed)
On 10/28/2017 the  NCCSR was reviewed no conflict was seen on the [facility] Controlled Substance Reporting System with multiple prescribers. [name] [name] has a signed narcotic contract with our office. If there were any discrepancies this would have been reported to his physician.

## 2017-11-01 ENCOUNTER — Other Ambulatory Visit: Payer: Self-pay | Admitting: Family Medicine

## 2017-11-01 NOTE — Telephone Encounter (Signed)
MyChart message sent to patient about making a follow up for more medication refills

## 2017-11-25 ENCOUNTER — Encounter: Payer: Self-pay | Admitting: Registered Nurse

## 2017-11-25 ENCOUNTER — Encounter: Payer: Medicare HMO | Attending: Physical Medicine & Rehabilitation | Admitting: Registered Nurse

## 2017-11-25 VITALS — BP 144/85 | HR 77 | Resp 14

## 2017-11-25 DIAGNOSIS — M7989 Other specified soft tissue disorders: Secondary | ICD-10-CM | POA: Insufficient documentation

## 2017-11-25 DIAGNOSIS — M799 Soft tissue disorder, unspecified: Secondary | ICD-10-CM | POA: Insufficient documentation

## 2017-11-25 DIAGNOSIS — G894 Chronic pain syndrome: Secondary | ICD-10-CM | POA: Diagnosis not present

## 2017-11-25 DIAGNOSIS — M5416 Radiculopathy, lumbar region: Secondary | ICD-10-CM

## 2017-11-25 DIAGNOSIS — E119 Type 2 diabetes mellitus without complications: Secondary | ICD-10-CM | POA: Diagnosis not present

## 2017-11-25 DIAGNOSIS — M25551 Pain in right hip: Secondary | ICD-10-CM | POA: Insufficient documentation

## 2017-11-25 DIAGNOSIS — Z5181 Encounter for therapeutic drug level monitoring: Secondary | ICD-10-CM | POA: Diagnosis not present

## 2017-11-25 DIAGNOSIS — Z79899 Other long term (current) drug therapy: Secondary | ICD-10-CM | POA: Insufficient documentation

## 2017-11-25 DIAGNOSIS — G6289 Other specified polyneuropathies: Secondary | ICD-10-CM

## 2017-11-25 DIAGNOSIS — M25552 Pain in left hip: Secondary | ICD-10-CM | POA: Diagnosis not present

## 2017-11-25 MED ORDER — GABAPENTIN 100 MG PO CAPS: ORAL_CAPSULE | ORAL | 2 refills | Status: DC

## 2017-11-25 MED ORDER — HYDROCODONE-ACETAMINOPHEN 7.5-325 MG PO TABS: 1.0000 | ORAL_TABLET | Freq: Four times a day (QID) | ORAL | 0 refills | Status: DC | PRN

## 2017-11-25 NOTE — Progress Notes (Signed)
Subjective:    Patient ID: [name] [name], male    DOB: [birth year], 61 y.o.   MRN: [id number]  HPI:  [name] is a 61 year old male who returns for follow up appointment for chronic pain and medication refill. He states his pain is located in his  lower back radiating into his bilateral hips and right lower extremity. He rates his pain 7.His current exercise regime is walking.  [name] had a tooth extraction and Dr. Maxie [name] prescribed him T&C #3, we reviewed the narcotic policy, he verbalizes understanding. He realizes if this occurs again can lead to discharge from our office, he verbalizes understanding.   [name] Morphine equivalent is 30.00 MME.   [name] last UDS was 05/31/2017 it was consistent.   Pain Inventory Average Pain 4 Pain Right Now 7 My pain is intermittent, sharp, stabbing and aching  In the last 24 hours, has pain interfered with the following? General activity 5 Relation with others 5 Enjoyment of life 3 What TIME of day is your pain at its worst? morning and night Sleep (in general) Fair  Pain is worse with: sitting and inactivity Pain improves with: rest, heat/ice, medication and injections Relief from Meds: 8  Mobility walk without assistance how many minutes can you walk? 10 ability to climb steps?  yes do you drive?  yes Do you have any goals in this area?  no  Function disabled: date disabled 2014 Do you have any goals in this area?  no  Neuro/Psych weakness numbness spasms depression  Prior Studies Any changes since last visit?  no  Physicians involved in your care Any changes since last visit?  no   Family History  Problem Relation Age of Onset  . Hypertension Mother   . Alzheimer's disease Mother   . Alcohol abuse Father   . Cancer Father        "Throat"  . Diabetes type II Brother   . Heart disease Neg Hx    Social History   Socioeconomic History  . Marital status: Married    Spouse name: None  . Number  of children: None  . Years of education: None  . Highest education level: None  Social Needs  . Financial resource strain: None  . Food insecurity - worry: None  . Food insecurity - inability: None  . Transportation needs - medical: None  . Transportation needs - non-medical: None  Occupational History  . Occupation: unemployed    Comment: applying for disability  Tobacco Use  . Smoking status: Former Smoker    Packs/day: 0.25    Years: 3.00    Pack years: 0.75    Last attempt to quit: 11/30/1998    Years since quitting: 19.0  . Smokeless tobacco: Never Used  Substance and Sexual Activity  . Alcohol use: No    Alcohol/week: 0.5 oz    Types: 1 Standard drinks or equivalent per week  . Drug use: No  . Sexual activity: Yes    Birth control/protection: None  Other Topics Concern  . None  Social History Narrative  . None   Past Surgical History:  Procedure Laterality Date  . LEFT HEART CATHETERIZATION WITH CORONARY ANGIOGRAM N/A 06/14/2014   Procedure: LEFT HEART CATHETERIZATION WITH CORONARY ANGIOGRAM;  Surgeon: Corky Crafts, MD;  Location: [facility] C[facility] LAB;  Service: Cardiovascular;  Laterality: N/A;  . TONSILLECTOMY     Past Medical History:  Diagnosis Date  . Barrett's esophagus   .  CAD (coronary artery disease)    a. NSTEMI 05/2014 - occluded RCA dominant proximal s/p asp-thrombectomy/DES to RCA, minimal LAD/LCx, EF 50% by cath, 65-70% by echo.  . Depression   . Diabetes type 2, uncontrolled (HCC)   . Former tobacco use   . GERD (gastroesophageal reflux disease)   . Hemorrhoids    hx of  . Hypercholesterolemia   . Hypertension   . MI (myocardial infarction) (HCC)   . Peripheral neuropathy    BP (!) 144/85 (BP Location: Left Arm, Patient Position: Sitting, Cuff Size: Normal)   Pulse 77   Resp 14   SpO2 95%   Opioid Risk Score:  1 Fall Risk Score:  `1  Depression screen PHQ 2/9  [facility] 10/01/2017 09/29/2017 03/08/2017 02/08/2017 09/15/2016  08/25/2016 08/18/2016  Decreased Interest 0 0 0 0 0 0 0  Down, Depressed, Hopeless 0 0 0 0 0 0 0  PHQ - 2 Score 0 0 0 0 0 0 0  Altered sleeping - - - - - - 3  Tired, decreased energy - - - - - - 2  Change in appetite - - - - - - 2  Feeling bad or failure about yourself  - - - - - - 0  Trouble concentrating - - - - - - 1  Moving slowly or fidgety/restless - - - - - - 0  Suicidal thoughts - - - - - - 0  PHQ-9 Score - - - - - - 8  Difficult doing work/chores - - - - - - Not difficult at all    Review of Systems  HENT: Negative.   Eyes: Negative.   Respiratory: Negative.   Cardiovascular: Negative.   Gastrointestinal: Negative.   Endocrine: Negative.   Genitourinary: Negative.   Musculoskeletal: Positive for arthralgias and back pain.       Spasms   Skin: Negative.   Allergic/Immunologic: Negative.   Neurological: Positive for weakness and numbness.  Hematological: Negative.   Psychiatric/Behavioral: Positive for dysphoric mood.  All other systems reviewed and are negative.      Objective:   Physical Exam  Constitutional: He is oriented to person, place, and time. He appears well-developed and well-nourished.  HENT:  Head: Normocephalic and atraumatic.  Neck: Normal range of motion. Neck supple.  Cardiovascular: Normal rate and regular rhythm.  Pulmonary/Chest: Effort normal and breath sounds normal.  Musculoskeletal:  Normal Muscle Bulk and Muscle Testing Reveals: Upper Extremities: Full ROM and Muscle Strength 5/5 Lumbar Paraspinal Tenderness: L-3-L-5 Bilateral Greater Trochanter Tenderness Lower Extremities: Full ROM and Muscle Strength 5/5 Right Lower Extremity Flexion Produces Pain into Extremity Arises from Table with ease Narrow Based gait  Neurological: He is alert and oriented to person, place, and time.  Skin: Skin is warm and dry.  Psychiatric: He has a normal mood and affect.  Nursing note and vitals reviewed.         Assessment & Plan:    1.Bilateral hip pain. 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 Ortho Following: Dr. Thurston [name] regarding right hip placement. 11/25/2017 Refilled: Hydrocodone 7.5/325 mg One tablet every 6 hours as needed #120. We will continue the opioid monitoring program, this consists of regular clinic visits, examinations, urine drug screen, pill counts as well as use [place].  2. Cervicalgia: Ortho Following: No complaints today. Continue with HEP as tolerated. Continue to Monitor. 11/25/2017 3. Type II DM: Polyneuropathy:Continue Gabapentin: 11/25/2017   20  minutes of face to face patient care time was spent during this visit. All questions were encouraged and answered.  F/U in 1 month

## 2017-12-17 DIAGNOSIS — R69 Illness, unspecified: Secondary | ICD-10-CM | POA: Diagnosis not present

## 2017-12-24 ENCOUNTER — Encounter: Payer: Medicare HMO | Attending: Physical Medicine & Rehabilitation | Admitting: Registered Nurse

## 2017-12-24 ENCOUNTER — Encounter: Payer: Self-pay | Admitting: Registered Nurse

## 2017-12-24 VITALS — BP 141/82 | HR 86

## 2017-12-24 DIAGNOSIS — M799 Soft tissue disorder, unspecified: Secondary | ICD-10-CM | POA: Insufficient documentation

## 2017-12-24 DIAGNOSIS — E119 Type 2 diabetes mellitus without complications: Secondary | ICD-10-CM | POA: Insufficient documentation

## 2017-12-24 DIAGNOSIS — M5416 Radiculopathy, lumbar region: Secondary | ICD-10-CM

## 2017-12-24 DIAGNOSIS — M7989 Other specified soft tissue disorders: Secondary | ICD-10-CM | POA: Insufficient documentation

## 2017-12-24 DIAGNOSIS — G6289 Other specified polyneuropathies: Secondary | ICD-10-CM | POA: Diagnosis not present

## 2017-12-24 DIAGNOSIS — G894 Chronic pain syndrome: Secondary | ICD-10-CM | POA: Diagnosis not present

## 2017-12-24 DIAGNOSIS — Z79899 Other long term (current) drug therapy: Secondary | ICD-10-CM

## 2017-12-24 DIAGNOSIS — M25551 Pain in right hip: Secondary | ICD-10-CM | POA: Diagnosis not present

## 2017-12-24 DIAGNOSIS — M25552 Pain in left hip: Secondary | ICD-10-CM | POA: Diagnosis not present

## 2017-12-24 DIAGNOSIS — Z5181 Encounter for therapeutic drug level monitoring: Secondary | ICD-10-CM | POA: Insufficient documentation

## 2017-12-24 MED ORDER — HYDROCODONE-ACETAMINOPHEN 7.5-325 MG PO TABS: 1.0000 | ORAL_TABLET | Freq: Four times a day (QID) | ORAL | 0 refills | Status: DC | PRN

## 2017-12-24 NOTE — Progress Notes (Signed)
Subjective:    Patient ID: [name] [name], male    DOB: 10/31/1956, 62 y.o.   MRN: [id number]  HPI:  [name] is a 62 year old male who returns for follow up appointment for chronic pain and medication refill. He states his pain is located in his lower back radiating into his bilateral hips and bilateral lower extremities. He rates his pain 6. His current exercise regime is walking.   [name] forgot his Hydrocodone, PMP site reviewed he picked up his medication on 11/25/2017, Narcotic Contract Reviewed, he verbalizes understanding.   [name] equivalent is 33.00 MME.   [name] last UDS was 05/31/2017 it was consistent. Oral Swab Performed Today.   Pain Inventory Average Pain 3 Pain Right Now 6 My pain is intermittent, sharp and aching  In the last 24 hours, has pain interfered with the following? General activity 4 Relation with others 4 Enjoyment of life 2 What TIME of day is your pain at its worst? morning and night Sleep (in general) Poor  Pain is worse with: sitting and inactivity Pain improves with: rest, heat/ice, medication and injections Relief from Meds: 8  Mobility walk without assistance how many minutes can you walk? 10 ability to climb steps?  yes do you drive?  yes Do you have any goals in this area?  no  Function disabled: date disabled 2014 Do you have any goals in this area?  no  Neuro/Psych weakness numbness tingling spasms  Prior Studies Any changes since last visit?  no  Physicians involved in your care Any changes since last visit?  no   Family History  Problem Relation Age of Onset  . Hypertension Mother   . Alzheimer's disease Mother   . Alcohol abuse Father   . Cancer Father        "Throat"  . Diabetes type II Brother   . Heart disease Neg Hx    Social History   Socioeconomic History  . Marital status: Married    Spouse name: Not on file  . Number of children: Not on file  . Years of education: Not on  file  . Highest education level: Not on file  Social Needs  . Financial resource strain: Not on file  . Food insecurity - worry: Not on file  . Food insecurity - inability: Not on file  . Transportation needs - medical: Not on file  . Transportation needs - non-medical: Not on file  Occupational History  . Occupation: unemployed    Comment: applying for disability  Tobacco Use  . Smoking status: Former Smoker    Packs/day: 0.25    Years: 3.00    Pack years: 0.75    Last attempt to quit: 11/30/1998    Years since quitting: 19.0  . Smokeless tobacco: Never Used  Substance and Sexual Activity  . Alcohol use: No    Alcohol/week: 0.5 oz    Types: 1 Standard drinks or equivalent per week  . Drug use: No  . Sexual activity: Yes    Birth control/protection: None  Other Topics Concern  . Not on file  Social History Narrative  . Not on file   Past Surgical History:  Procedure Laterality Date  . LEFT HEART CATHETERIZATION WITH CORONARY ANGIOGRAM N/A 06/14/2014   Procedure: LEFT HEART CATHETERIZATION WITH CORONARY ANGIOGRAM;  Surgeon: Corky Crafts, MD;  Location: [facility] CATH LAB;  Service: Cardiovascular;  Laterality: N/A;  . TONSILLECTOMY     Past  Medical History:  Diagnosis Date  . Barrett's esophagus   . CAD (coronary artery disease)    a. NSTEMI 05/2014 - occluded RCA dominant proximal s/p asp-thrombectomy/DES to RCA, minimal LAD/LCx, EF 50% by cath, 65-70% by echo.  . Depression   . Diabetes type 2, uncontrolled (HCC)   . Former tobacco use   . GERD (gastroesophageal reflux disease)   . Hemorrhoids    hx of  . Hypercholesterolemia   . Hypertension   . MI (myocardial infarction) (HCC)   . Peripheral neuropathy    There were no vitals taken for this visit.  Opioid Risk Score:  1 Fall Risk Score:  `1  Depression screen PHQ 2/9  Depression screen [facility] 2/9 10/01/2017 09/29/2017 03/08/2017 02/08/2017 09/15/2016 08/25/2016 08/18/2016  Decreased Interest 0 0 0 0 0 0 0  Down,  Depressed, Hopeless 0 0 0 0 0 0 0  PHQ - 2 Score 0 0 0 0 0 0 0  Altered sleeping - - - - - - 3  Tired, decreased energy - - - - - - 2  Change in appetite - - - - - - 2  Feeling bad or failure about yourself  - - - - - - 0  Trouble concentrating - - - - - - 1  Moving slowly or fidgety/restless - - - - - - 0  Suicidal thoughts - - - - - - 0  PHQ-9 Score - - - - - - 8  Difficult doing work/chores - - - - - - Not difficult at all    Review of Systems  HENT: Negative.   Eyes: Negative.   Respiratory: Negative.   Cardiovascular: Negative.   Gastrointestinal: Negative.   Endocrine: Negative.   Genitourinary: Negative.   Musculoskeletal: Positive for arthralgias, back pain and myalgias.       Spasms   Skin: Negative.   Allergic/Immunologic: Negative.   Neurological: Positive for weakness and numbness.  Hematological: Negative.   Psychiatric/Behavioral: Positive for dysphoric mood.  All other systems reviewed and are negative.      Objective:   Physical Exam  Constitutional: He is oriented to person, place, and time. He appears well-developed and well-nourished.  HENT:  Head: Normocephalic and atraumatic.  Neck: Normal range of motion. Neck supple.  Cardiovascular: Normal rate and regular rhythm.  Pulmonary/Chest: Effort normal and breath sounds normal.  Musculoskeletal:  Normal Muscle Bulk and Muscle Testing Reveals: Upper Extremities: Full ROM and Muscle Strength 5/5 Lumbar Paraspinal Tenderness: L-3-L-5 Bilateral  Greater Trochanter Tenderness Lower Extremities: Full ROM and Muscle Strength 5/5 Bilateral Lower Extremities Flexion Produces Pain into Bilateral Lower Extremities Arises from Table with ease Narrow Based gait  Neurological: He is alert and oriented to person, place, and time.  Skin: Skin is warm and dry. No erythema.  Psychiatric: He has a normal mood and affect.  Nursing note and vitals reviewed.         Assessment & Plan:  1.Bilateral hip pain. 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 Ortho Following: Dr. Thurston Hole regarding right hip placement.12/24/2017 Refilled: Hydrocodone 7.5/325 mg One tablet every 6 hours as needed #120. We will continue the opioid monitoring program, this consists of regular clinic visits, examinations, urine drug screen, pill counts as well as use of [place] Controlled Substance Reporting System.  2. Cervicalgia: Ortho Following: No complaints today. Continue with HEP as tolerated. Continue to Monitor. 12/24/2017 3. Type II DM: Polyneuropathy:Continue Gabapentin: 12/24/2017  4. Lumbar Radiculitis: Continue  Gabapentin. 12/24/2017  20 minutes of face to face patient care time was spent during this visit. All questions were encouraged and answered.  F/U in 1 month

## 2017-12-29 ENCOUNTER — Telehealth: Payer: Self-pay | Admitting: *Deleted

## 2017-12-29 LAB — DRUG TOX MONITOR 1 W/CONF, ORAL FLD
AMPHETAMINES: NEGATIVE ng/mL (ref <10)
BARBITURATES: NEGATIVE ng/mL (ref <10)
Benzodiazepines: NEGATIVE ng/mL (ref <0.50)
Buprenorphine: NEGATIVE ng/mL (ref <0.10)
CODEINE: NEGATIVE ng/mL (ref <2.5)
Cocaine: NEGATIVE ng/mL (ref <5.0)
DIHYDROCODEINE: NEGATIVE ng/mL (ref <2.5)
FENTANYL: NEGATIVE ng/mL (ref <0.10)
HYDROCODONE: 10.7 ng/mL — AB (ref <2.5)
Heroin Metabolite: NEGATIVE ng/mL (ref <1.0)
Hydromorphone: NEGATIVE ng/mL (ref <2.5)
MARIJUANA: NEGATIVE ng/mL (ref <2.5)
MDMA: NEGATIVE ng/mL (ref <10)
Meprobamate: NEGATIVE ng/mL (ref <2.5)
Methadone: NEGATIVE ng/mL (ref <5.0)
Morphine: NEGATIVE ng/mL (ref <2.5)
NICOTINE METABOLITE: NEGATIVE ng/mL (ref <5.0)
Norhydrocodone: NEGATIVE ng/mL (ref <2.5)
Noroxycodone: NEGATIVE ng/mL (ref <2.5)
OPIATES: POSITIVE ng/mL — AB (ref <2.5)
OXYMORPHONE: NEGATIVE ng/mL (ref <2.5)
Oxycodone: NEGATIVE ng/mL (ref <2.5)
PHENCYCLIDINE: NEGATIVE ng/mL (ref <10)
Tapentadol: NEGATIVE ng/mL (ref <5.0)
Tramadol: NEGATIVE ng/mL (ref <5.0)
ZOLPIDEM: NEGATIVE ng/mL (ref <5.0)

## 2017-12-29 LAB — DRUG TOX ALC METAB W/CON, ORAL FLD: Alcohol Metabolite: NEGATIVE ng/mL (ref <25)

## 2017-12-29 NOTE — Telephone Encounter (Signed)
Oral swab drug screen was consistent for prescribed medications.  ?

## 2018-01-19 ENCOUNTER — Encounter: Payer: Medicare HMO | Attending: Physical Medicine & Rehabilitation | Admitting: Registered Nurse

## 2018-01-19 ENCOUNTER — Encounter: Payer: Self-pay | Admitting: Registered Nurse

## 2018-01-19 VITALS — BP 135/89 | HR 77

## 2018-01-19 DIAGNOSIS — M25552 Pain in left hip: Secondary | ICD-10-CM

## 2018-01-19 DIAGNOSIS — M799 Soft tissue disorder, unspecified: Secondary | ICD-10-CM | POA: Diagnosis not present

## 2018-01-19 DIAGNOSIS — E119 Type 2 diabetes mellitus without complications: Secondary | ICD-10-CM | POA: Diagnosis not present

## 2018-01-19 DIAGNOSIS — M5416 Radiculopathy, lumbar region: Secondary | ICD-10-CM

## 2018-01-19 DIAGNOSIS — Z5181 Encounter for therapeutic drug level monitoring: Secondary | ICD-10-CM | POA: Diagnosis not present

## 2018-01-19 DIAGNOSIS — Z79899 Other long term (current) drug therapy: Secondary | ICD-10-CM | POA: Diagnosis not present

## 2018-01-19 DIAGNOSIS — G63 Polyneuropathy in diseases classified elsewhere: Secondary | ICD-10-CM

## 2018-01-19 DIAGNOSIS — M7989 Other specified soft tissue disorders: Secondary | ICD-10-CM | POA: Diagnosis not present

## 2018-01-19 DIAGNOSIS — G894 Chronic pain syndrome: Secondary | ICD-10-CM | POA: Diagnosis not present

## 2018-01-19 DIAGNOSIS — M25551 Pain in right hip: Secondary | ICD-10-CM | POA: Diagnosis not present

## 2018-01-19 MED ORDER — HYDROCODONE-ACETAMINOPHEN 7.5-325 MG PO TABS: 1.0000 | ORAL_TABLET | Freq: Four times a day (QID) | ORAL | 0 refills | Status: DC | PRN

## 2018-01-19 NOTE — Progress Notes (Signed)
Subjective:    Patient ID: [name] [name], male    DOB: 07-08-1956, 62 y.o.   MRN: [id number]  HPI:  [name] is a 62 year old male who returns for follow up appointment for chronic pain and medication refill. He states his pain is located in his lower back radiating into his bilateral hips. He rates his pain 6. His current exercise regime is walking.   [name] Morphine equivalent is 31.00 MME.   [name] Oral Swab was Performed on 12/24/2017 it was consistent.  Pain Inventory Average Pain 4 Pain Right Now 6 My pain is constant, sharp and aching  In the last 24 hours, has pain interfered with the following? General activity 3 Relation with others 3 Enjoyment of life 1 What TIME of day is your pain at its worst? evening and night Sleep (in general) Poor  Pain is worse with: inactivity Pain improves with: medication and injections Relief from Meds: 8  Mobility walk without assistance how many minutes can you walk? 10 ability to climb steps?  yes do you drive?  yes Do you have any goals in this area?  no  Function disabled: date disabled 2014  Neuro/Psych weakness numbness tingling spasms  Prior Studies Any changes since last visit?  no  Physicians involved in your care Any changes since last visit?  no   Family History  Problem Relation Age of Onset  . Hypertension Mother   . Alzheimer's disease Mother   . Alcohol abuse Father   . Cancer Father        "Throat"  . Diabetes type II Brother   . Heart disease Neg Hx    Social History   Socioeconomic History  . Marital status: Married    Spouse name: Not on file  . Number of children: Not on file  . Years of education: Not on file  . Highest education level: Not on file  Social Needs  . Financial resource strain: Not on file  . Food insecurity - worry: Not on file  . Food insecurity - inability: Not on file  . Transportation needs - medical: Not on file  . Transportation needs -  non-medical: Not on file  Occupational History  . Occupation: unemployed    Comment: applying for disability  Tobacco Use  . Smoking status: Former Smoker    Packs/day: 0.25    Years: 3.00    Pack years: 0.75    Last attempt to quit: 11/30/1998    Years since quitting: 19.1  . Smokeless tobacco: Never Used  Substance and Sexual Activity  . Alcohol use: No    Alcohol/week: 0.5 oz    Types: 1 Standard drinks or equivalent per week  . Drug use: No  . Sexual activity: Yes    Birth control/protection: None  Other Topics Concern  . Not on file  Social History Narrative  . Not on file   Past Surgical History:  Procedure Laterality Date  . LEFT HEART CATHETERIZATION WITH CORONARY ANGIOGRAM N/A 06/14/2014   Procedure: LEFT HEART CATHETERIZATION WITH CORONARY ANGIOGRAM;  Surgeon: Corky Crafts, MD;  Location: [facility] CATH LAB;  Service: Cardiovascular;  Laterality: N/A;  . TONSILLECTOMY     Past Medical History:  Diagnosis Date  . Barrett's esophagus   . CAD (coronary artery disease)    a. NSTEMI 05/2014 - occluded RCA dominant proximal s/p asp-thrombectomy/DES to RCA, minimal LAD/LCx, EF 50% by cath, 65-70% by echo.  . Depression   .  Diabetes type 2, uncontrolled (HCC)   . Former tobacco use   . GERD (gastroesophageal reflux disease)   . Hemorrhoids    hx of  . Hypercholesterolemia   . Hypertension   . MI (myocardial infarction) (HCC)   . Peripheral neuropathy    There were no vitals taken for this visit.  Opioid Risk Score:  1 Fall Risk Score:  `1  Depression screen PHQ 2/9  Depression screen [facility] 2/9 10/01/2017 09/29/2017 03/08/2017 02/08/2017 09/15/2016 08/25/2016 08/18/2016  Decreased Interest 0 0 0 0 0 0 0  Down, Depressed, Hopeless 0 0 0 0 0 0 0  PHQ - 2 Score 0 0 0 0 0 0 0  Altered sleeping - - - - - - 3  Tired, decreased energy - - - - - - 2  Change in appetite - - - - - - 2  Feeling bad or failure about yourself  - - - - - - 0  Trouble concentrating - - - - - - 1    Moving slowly or fidgety/restless - - - - - - 0  Suicidal thoughts - - - - - - 0  PHQ-9 Score - - - - - - 8  Difficult doing work/chores - - - - - - Not difficult at all    Review of Systems  HENT: Negative.   Eyes: Negative.   Respiratory: Negative.   Cardiovascular: Negative.   Gastrointestinal: Negative.   Endocrine: Negative.   Genitourinary: Negative.   Musculoskeletal:       Spasms   Skin: Negative.   Allergic/Immunologic: Negative.   Neurological: Positive for weakness and numbness.       Tingling Spasms  Hematological: Negative.   Psychiatric/Behavioral: Positive for dysphoric mood.  All other systems reviewed and are negative.      Objective:   Physical Exam  Constitutional: He is oriented to person, place, and time. He appears well-developed and well-nourished.  HENT:  Head: Normocephalic and atraumatic.  Neck: Normal range of motion. Neck supple.  Cardiovascular: Normal rate and regular rhythm.  Pulmonary/Chest: Effort normal and breath sounds normal.  Musculoskeletal:  Normal Muscle Bulk and Muscle Testing Reveals: Upper Extremities: Full ROM and Muscle Strength 5/5 Lumbar Paraspinal Tenderness: L-3-L-5 Bilateral  Greater Trochanter Tenderness Lower Extremities: Full ROM and Muscle Strength 5/5 Arises from Table with ease Narrow Based gait  Neurological: He is alert and oriented to person, place, and time.  Skin: Skin is warm and dry. No erythema.  Psychiatric: He has a normal mood and affect.  Nursing note and vitals reviewed.         Assessment & Plan:  1.Bilateral hip pain. 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 Ortho Following: Dr. Thurston Hole regarding right hip placement.01/19/2018 Refilled: Hydrocodone 7.5/325 mg One tablet every 6 hours as needed #120. We will continue the opioid monitoring program, this consists of regular clinic visits, examinations, urine drug screen, pill counts as well as use of [place]  Controlled Substance Reporting System.  2. Cervicalgia: Ortho Following: No complaints today. Continue with HEP as tolerated. Continue to Monitor. 01/19/2018 3. Type II DM: Polyneuropathy:Continue Gabapentin: 01/19/2018  4. Lumbar Radiculitis: Continue Gabapentin. 01/19/2018  20 minutes of face to face patient care time was spent during this visit. All questions were encouraged and answered.  F/U in 1 month

## 2018-02-16 ENCOUNTER — Encounter: Payer: Medicare HMO | Attending: Physical Medicine & Rehabilitation | Admitting: Registered Nurse

## 2018-02-16 DIAGNOSIS — Z5181 Encounter for therapeutic drug level monitoring: Secondary | ICD-10-CM | POA: Insufficient documentation

## 2018-02-16 DIAGNOSIS — G894 Chronic pain syndrome: Secondary | ICD-10-CM | POA: Insufficient documentation

## 2018-02-16 DIAGNOSIS — M25551 Pain in right hip: Secondary | ICD-10-CM | POA: Insufficient documentation

## 2018-02-16 DIAGNOSIS — E119 Type 2 diabetes mellitus without complications: Secondary | ICD-10-CM | POA: Insufficient documentation

## 2018-02-16 DIAGNOSIS — M799 Soft tissue disorder, unspecified: Secondary | ICD-10-CM | POA: Insufficient documentation

## 2018-02-16 DIAGNOSIS — M25552 Pain in left hip: Secondary | ICD-10-CM | POA: Insufficient documentation

## 2018-02-16 DIAGNOSIS — Z79899 Other long term (current) drug therapy: Secondary | ICD-10-CM | POA: Insufficient documentation

## 2018-02-16 DIAGNOSIS — M7989 Other specified soft tissue disorders: Secondary | ICD-10-CM | POA: Insufficient documentation

## 2018-02-17 ENCOUNTER — Telehealth: Payer: Self-pay | Admitting: Physical Medicine & Rehabilitation

## 2018-02-17 ENCOUNTER — Other Ambulatory Visit: Payer: Self-pay | Admitting: Family Medicine

## 2018-02-17 MED ORDER — HYDROCODONE-ACETAMINOPHEN 7.5-325 MG PO TABS: 1.0000 | ORAL_TABLET | Freq: Four times a day (QID) | ORAL | 0 refills | Status: DC | PRN

## 2018-02-17 NOTE — Telephone Encounter (Signed)
Pt is requesting a refill on his Hydrocodone. He "no showed" on 03/20, but has rescheduled for Monday 03/25. Please advise.

## 2018-02-17 NOTE — Telephone Encounter (Signed)
Return [name] [name] call, he missed his appointment due to death in the family, aunt passed away in [place]. Emotional support offered. We will e-scribe his Hydrocodone, he verbalizes understanding. According to PMP Aware Web-site last prescription picked up 01/19/2018.

## 2018-02-17 NOTE — Telephone Encounter (Signed)
Request refill for Novolog Flexpen 100 unit/ml flexpen.  Last refill  06/25/17  LOV  09/29/17  NOV  03/03/18  Last HA1C  Was 09/18/17 of 8.4  Provider  Norberto Sorenson, MD  Pharmacy  [facility]  [phone], [address], [place], [place]

## 2018-02-17 NOTE — Telephone Encounter (Signed)
Copied from CRM [id number]. Topic: General - Other >> Feb 17, 2018  2:36 PM Cecelia Byars, RMA wrote: Reason for CRM: Medication refill request for insulin aspart (NOVOLOG FLEXPEN) 100 UNIT/ML FlexPen to be sent to [facility]

## 2018-02-18 MED ORDER — INSULIN ASPART 100 UNIT/ML FLEXPEN: PEN_INJECTOR | SUBCUTANEOUS | 1 refills | Status: DC

## 2018-02-21 ENCOUNTER — Ambulatory Visit: Payer: Medicare HMO | Admitting: Registered Nurse

## 2018-03-03 ENCOUNTER — Ambulatory Visit: Payer: Medicare HMO | Admitting: Family Medicine

## 2018-03-05 DIAGNOSIS — R69 Illness, unspecified: Secondary | ICD-10-CM | POA: Diagnosis not present

## 2018-03-07 ENCOUNTER — Ambulatory Visit: Payer: Medicare HMO | Admitting: Family Medicine

## 2018-03-10 ENCOUNTER — Encounter: Payer: Medicare HMO | Attending: Physical Medicine & Rehabilitation | Admitting: Registered Nurse

## 2018-03-10 ENCOUNTER — Encounter: Payer: Self-pay | Admitting: Registered Nurse

## 2018-03-10 VITALS — BP 138/88 | HR 85 | Resp 14 | Ht 69.0 in | Wt 232.0 lb

## 2018-03-10 DIAGNOSIS — M25551 Pain in right hip: Secondary | ICD-10-CM

## 2018-03-10 DIAGNOSIS — M5416 Radiculopathy, lumbar region: Secondary | ICD-10-CM | POA: Diagnosis not present

## 2018-03-10 DIAGNOSIS — G894 Chronic pain syndrome: Secondary | ICD-10-CM

## 2018-03-10 DIAGNOSIS — Z79899 Other long term (current) drug therapy: Secondary | ICD-10-CM | POA: Diagnosis not present

## 2018-03-10 DIAGNOSIS — M799 Soft tissue disorder, unspecified: Secondary | ICD-10-CM | POA: Insufficient documentation

## 2018-03-10 DIAGNOSIS — M7989 Other specified soft tissue disorders: Secondary | ICD-10-CM | POA: Insufficient documentation

## 2018-03-10 DIAGNOSIS — Z5181 Encounter for therapeutic drug level monitoring: Secondary | ICD-10-CM | POA: Diagnosis not present

## 2018-03-10 DIAGNOSIS — M25552 Pain in left hip: Secondary | ICD-10-CM | POA: Diagnosis not present

## 2018-03-10 DIAGNOSIS — E119 Type 2 diabetes mellitus without complications: Secondary | ICD-10-CM | POA: Diagnosis not present

## 2018-03-10 DIAGNOSIS — G63 Polyneuropathy in diseases classified elsewhere: Secondary | ICD-10-CM | POA: Diagnosis not present

## 2018-03-10 MED ORDER — HYDROCODONE-ACETAMINOPHEN 7.5-325 MG PO TABS: 1.0000 | ORAL_TABLET | Freq: Four times a day (QID) | ORAL | 0 refills | Status: DC | PRN

## 2018-03-10 NOTE — Progress Notes (Signed)
Subjective:    Patient ID: [name] [name], male    DOB: 07-11-1956, 62 y.o.   MRN: [id number]  HPI: [name] [name] is a 62 year old male who returns for follow up appointment for chronic pain and medication refill. He states his pain is located in his lower back radiating into hisleft hip, left lower extremity and left foot. He rates his pain 7. His current exercise regime is walking, he was encouraged to increase his HEP as tolerated. .  [name] [name] Morphine equivalent is 31.00.   Pain Inventory Average Pain 3 Pain Right Now 7 My pain is constant, stabbing and aching  In the last 24 hours, has pain interfered with the following? General activity 5 Relation with others 5 Enjoyment of life 3 What TIME of day is your pain at its worst? evening, night Sleep (in general) Poor  Pain is worse with: walking, sitting and inactivity Pain improves with: rest, medication and injections Relief from Meds: 7  Mobility walk without assistance how many minutes can you walk? 10 ability to climb steps?  yes do you drive?  yes transfers alone Do you have any goals in this area?  no  Function disabled: date disabled . I need assistance with the following:  dressing Do you have any goals in this area?  no  Neuro/Psych weakness numbness tingling spasms anxiety  Prior Studies Any changes since last visit?  no  Physicians involved in your care Any changes since last visit?  no   Family History  Problem Relation Age of Onset  . Hypertension Mother   . Alzheimer's disease Mother   . Alcohol abuse Father   . Cancer Father        "Throat"  . Diabetes type II Brother   . Heart disease Neg Hx    Social History   Socioeconomic History  . Marital status: Married    Spouse name: Not on file  . Number of children: Not on file  . Years of education: Not on file  . Highest education level: Not on file  Occupational History  . Occupation: unemployed    Comment: applying for  disability  Social Needs  . Financial resource strain: Not on file  . Food insecurity:    Worry: Not on file    Inability: Not on file  . Transportation needs:    Medical: Not on file    Non-medical: Not on file  Tobacco Use  . Smoking status: Former Smoker    Packs/day: 0.25    Years: 3.00    Pack years: 0.75    Last attempt to quit: 11/30/1998    Years since quitting: 19.2  . Smokeless tobacco: Never Used  Substance and Sexual Activity  . Alcohol use: No    Alcohol/week: 0.5 oz    Types: 1 Standard drinks or equivalent per week  . Drug use: No  . Sexual activity: Yes    Birth control/protection: None  Lifestyle  . Physical activity:    Days per week: Not on file    Minutes per session: Not on file  . Stress: Not on file  Relationships  . Social connections:    Talks on phone: Not on file    Gets together: Not on file    Attends religious service: Not on file    Active member of club or organization: Not on file    Attends meetings of clubs or organizations: Not on file    Relationship status:  Not on file  Other Topics Concern  . Not on file  Social History Narrative  . Not on file   Past Surgical History:  Procedure Laterality Date  . LEFT HEART CATHETERIZATION WITH CORONARY ANGIOGRAM N/A 06/14/2014   Procedure: LEFT HEART CATHETERIZATION WITH CORONARY ANGIOGRAM;  Surgeon: Corky Crafts, MD;  Location: [facility] CATH LAB;  Service: Cardiovascular;  Laterality: N/A;  . TONSILLECTOMY     Past Medical History:  Diagnosis Date  . Barrett's esophagus   . CAD (coronary artery disease)    a. NSTEMI 05/2014 - occluded RCA dominant proximal s/p asp-thrombectomy/DES to RCA, minimal LAD/LCx, EF 50% by cath, 65-70% by echo.  . Depression   . Diabetes type 2, uncontrolled (HCC)   . Former tobacco use   . GERD (gastroesophageal reflux disease)   . Hemorrhoids    hx of  . Hypercholesterolemia   . Hypertension   . MI (myocardial infarction) (HCC)   . Peripheral neuropathy      BP 138/88 (BP Location: Right Arm, Patient Position: Sitting, Cuff Size: Normal)   Pulse 85   Resp 14   Ht 5\' 9"  (1.753 m)   Wt 232 lb (105.2 kg)   SpO2 98%   BMI 34.26 kg/m   Opioid Risk Score:   Fall Risk Score:  `1  Depression screen PHQ 2/9  Depression screen [facility] 2/9 10/01/2017 09/29/2017 03/08/2017 02/08/2017 09/15/2016 08/25/2016 08/18/2016  Decreased Interest 0 0 0 0 0 0 0  Down, Depressed, Hopeless 0 0 0 0 0 0 0  PHQ - 2 Score 0 0 0 0 0 0 0  Altered sleeping - - - - - - 3  Tired, decreased energy - - - - - - 2  Change in appetite - - - - - - 2  Feeling bad or failure about yourself  - - - - - - 0  Trouble concentrating - - - - - - 1  Moving slowly or fidgety/restless - - - - - - 0  Suicidal thoughts - - - - - - 0  PHQ-9 Score - - - - - - 8  Difficult doing work/chores - - - - - - Not difficult at all     Review of Systems  Constitutional: Negative.   HENT: Negative.   Eyes: Negative.   Respiratory: Negative.   Cardiovascular: Negative.   Gastrointestinal: Negative.   Endocrine: Negative.   Genitourinary: Negative.   Musculoskeletal: Positive for arthralgias.       Spasms  Skin: Negative.   Allergic/Immunologic: Negative.   Neurological: Positive for weakness and numbness.       Tingling  Hematological: Negative.   Psychiatric/Behavioral: The patient is nervous/anxious.        Objective:   Physical Exam  Constitutional: He is oriented to person, place, and time. He appears well-developed and well-nourished.  HENT:  Head: Normocephalic and atraumatic.  Neck: Normal range of motion. Neck supple.  Cardiovascular: Normal rate and regular rhythm.  Pulmonary/Chest: Effort normal and breath sounds normal.  Musculoskeletal:  Normal Muscle Bulk and Muscle Testing Reveals: Upper Extremities: Full ROM and Muscle Strength 5/5 Lumbar Paraspinal Tenderness: L-3-L-5 Left Greater Trochanter Tenderness Lower Extremities: Full ROM and Muscle Strength 5/5 Arises from  Table with Ease  Narrow Based Gait  Neurological: He is alert and oriented to person, place, and time.  Skin: Skin is warm and dry.  Psychiatric: He has a normal mood and affect.  Nursing note and vitals reviewed.  Assessment & Plan:  1.Bilateral hip pain. 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 Ortho Following: Dr. Thurston Hole regarding right hip placement.03/10/2018 We will continue with current medication regime. Refilled: Hydrocodone 7.5/325 mg One tablet every 6 hours as needed #120. We will continue the opioid monitoring program, this consists of regular clinic visits, examinations, urine drug screen, pill counts as well as use of [place] Controlled Substance Reporting System.  2. Cervicalgia: Ortho Following: No complaints today. Continue with HEP as tolerated. Continue to Monitor. 03/10/2018 3. Type II DM: Polyneuropathy:Continue current medication regime with Gabapentin: 03/10/2018  4. Lumbar Radiculitis: Continue current medication regimen with  Gabapentin. 03/10/2018  20 minutes of face to face patient care time was spent during this visit. All questions were encouraged and answered.  F/U in 1 month

## 2018-03-11 ENCOUNTER — Encounter: Payer: Self-pay | Admitting: Family Medicine

## 2018-03-11 ENCOUNTER — Ambulatory Visit (INDEPENDENT_AMBULATORY_CARE_PROVIDER_SITE_OTHER): Payer: Medicare HMO | Admitting: Family Medicine

## 2018-03-11 ENCOUNTER — Other Ambulatory Visit: Payer: Self-pay

## 2018-03-11 VITALS — BP 122/66 | HR 88 | Temp 98.0°F | Resp 18 | Ht 69.0 in | Wt 233.2 lb

## 2018-03-11 DIAGNOSIS — E109 Type 1 diabetes mellitus without complications: Secondary | ICD-10-CM | POA: Diagnosis not present

## 2018-03-11 DIAGNOSIS — Z79899 Other long term (current) drug therapy: Secondary | ICD-10-CM

## 2018-03-11 DIAGNOSIS — G6289 Other specified polyneuropathies: Secondary | ICD-10-CM | POA: Diagnosis not present

## 2018-03-11 DIAGNOSIS — Z7984 Long term (current) use of oral hypoglycemic drugs: Secondary | ICD-10-CM | POA: Diagnosis not present

## 2018-03-11 DIAGNOSIS — R1310 Dysphagia, unspecified: Secondary | ICD-10-CM

## 2018-03-11 DIAGNOSIS — R5383 Other fatigue: Secondary | ICD-10-CM | POA: Diagnosis not present

## 2018-03-11 DIAGNOSIS — E559 Vitamin D deficiency, unspecified: Secondary | ICD-10-CM | POA: Diagnosis not present

## 2018-03-11 DIAGNOSIS — I25118 Atherosclerotic heart disease of native coronary artery with other forms of angina pectoris: Secondary | ICD-10-CM

## 2018-03-11 DIAGNOSIS — E049 Nontoxic goiter, unspecified: Secondary | ICD-10-CM | POA: Diagnosis not present

## 2018-03-11 DIAGNOSIS — R7309 Other abnormal glucose: Secondary | ICD-10-CM | POA: Diagnosis not present

## 2018-03-11 DIAGNOSIS — E1065 Type 1 diabetes mellitus with hyperglycemia: Secondary | ICD-10-CM

## 2018-03-11 DIAGNOSIS — E1165 Type 2 diabetes mellitus with hyperglycemia: Secondary | ICD-10-CM | POA: Diagnosis not present

## 2018-03-11 DIAGNOSIS — R209 Unspecified disturbances of skin sensation: Secondary | ICD-10-CM

## 2018-03-11 DIAGNOSIS — Z800 Family history of malignant neoplasm of digestive organs: Secondary | ICD-10-CM | POA: Diagnosis not present

## 2018-03-11 LAB — POCT CBC
GRANULOCYTE PERCENT: 57.8 % (ref 37–80)
HCT, POC: 41.2 % — AB (ref 43.5–53.7)
HEMOGLOBIN: 13.6 g/dL — AB (ref 14.1–18.1)
Lymph, poc: 2.3 (ref 0.6–3.4)
MCH: 28.1 pg (ref 27–31.2)
MCHC: 33 g/dL (ref 31.8–35.4)
MCV: 85 fL (ref 80–97)
MID (CBC): 0.3 (ref 0–0.9)
MPV: 7.4 fL (ref 0–99.8)
PLATELET COUNT, POC: 293 10*3/uL (ref 142–424)
POC Granulocyte: 3.6 (ref 2–6.9)
POC LYMPH PERCENT: 37.6 %L (ref 10–50)
POC MID %: 4.6 %M (ref 0–12)
RBC: 4.85 M/uL (ref 4.69–6.13)
RDW, POC: 13.7 %
WBC: 6.2 10*3/uL (ref 4.6–10.2)

## 2018-03-11 LAB — POCT URINALYSIS DIP (MANUAL ENTRY)
BILIRUBIN UA: NEGATIVE
Glucose, UA: 250 mg/dL — AB
Leukocytes, UA: NEGATIVE
Nitrite, UA: NEGATIVE
PH UA: 5.5 (ref 5.0–8.0)
Protein Ur, POC: 30 mg/dL — AB
Spec Grav, UA: >=1.03 — AB (ref 1.010–1.025)
Urobilinogen, UA: 0.2 E.U./dL

## 2018-03-11 LAB — GLUCOSE, POCT (MANUAL RESULT ENTRY): POC Glucose: 168 mg/dl — AB (ref 70–99)

## 2018-03-11 LAB — POCT GLYCOSYLATED HEMOGLOBIN (HGB A1C): Hemoglobin A1C: 9

## 2018-03-11 MED ORDER — PRASUGREL HCL 10 MG PO TABS: 10.0000 mg | ORAL_TABLET | Freq: Every day | ORAL | 1 refills | Status: DC

## 2018-03-11 MED ORDER — METOPROLOL TARTRATE 25 MG PO TABS: 12.5000 mg | ORAL_TABLET | Freq: Two times a day (BID) | ORAL | 1 refills | Status: DC

## 2018-03-11 MED ORDER — LEVEMIR FLEXTOUCH 100 UNIT/ML ~~LOC~~ SOPN: PEN_INJECTOR | SUBCUTANEOUS | 1 refills | Status: DC

## 2018-03-11 MED ORDER — INSULIN PEN NEEDLE 31G X 5 MM MISC: 2 refills | Status: DC

## 2018-03-11 MED ORDER — ROSUVASTATIN CALCIUM 40 MG PO TABS: 40.0000 mg | ORAL_TABLET | Freq: Every day | ORAL | 1 refills | Status: DC

## 2018-03-11 MED ORDER — LISINOPRIL 2.5 MG PO TABS: 2.5000 mg | ORAL_TABLET | Freq: Every day | ORAL | 1 refills | Status: DC

## 2018-03-11 MED ORDER — INSULIN ASPART 100 UNIT/ML FLEXPEN: PEN_INJECTOR | SUBCUTANEOUS | 1 refills | Status: DC

## 2018-03-11 NOTE — Progress Notes (Signed)
Subjective:  By signing my name below, I, [name] [name], attest that this documentation has been prepared under the direction and in the presence of [name], MD. Electronically Signed: [name], [place]. 03/11/2018 , 4:40 PM .  Patient was seen in Room 1 .   Patient ID: [name] [name], male    DOB: 1956-11-04, 62 y.o.   MRN: [id number] Chief Complaint  Patient presents with  . Diabetes  . Follow-up   HPI [name] [name] is a 62 y.o. male who presents to Primary Care at [facility] for follow up. Patient has been staying busy, and balancing family. He's graduating and going into doctoral program. His daughter plans to finish occupational therapy doctoral program in [place]. He isn't fasting today.   Lab Results  Component Value Date   HGBA1C 9.0 03/11/2018   HGBA1C 8.5 09/29/2017   HGBA1C 8.7 06/25/2017   He brought his glucometer in today. He checks his sugar about twice a day. He's been sleeping around 3:00-4:00AM at night, and sometimes wakes up around 8:00AM - 10:00AM. On Tuesdays and Thursdays, he does have classes in the afternoon. His lowest sugar was down to 45. He's been taking 40 units of Levemir BID, sliding on Novolog 25 units before meal BID, and bedtime 15 units depending on [name] sugar. He usually eats 2 meals, breakfast and dinner, as well as a snack between; but not consistent whole meals. His vision is doing well, denies any changes. He denies any urinary symptoms. He has noticed increased thirst, and is still trying to cut back on soda. His last visit with Dr. Dwyane Dee was in Dec 2015; has suggested insulin pump. He's been taking pantoprazole daily for GERD. He denies bleeding issues with Effient. He has felt more fatigued recently, and decreased energy.   His sugar readings from his glucometer:  Noon: 155, 181, 328, 411, 211, 230, 293 Evening: 232, 303, 454, 128, 228, 290, 273, 403  He also reports feet feeling sore recently. He mentions that he sold his house, and has  been living in a second floor apartment. He's been going up and down the stairs often, including walking his dogs up to 3 times a day. He's been applying ice packs over his feet. He's followed by Dr. Naaman Plummer for polyneuropathy, currently taking hydrocodone; received injections in the past and potentially planning on hip replacement. He denies taking vitamins or supplements.   Family history of throat cancer Father passed away with throat cancer in 1988 with history of smoking, at the age of 60 years old. His older brother, 71 years old, passed away 2 years ago with throat cancer as well without history of smoking.   Patient has noticed occasional sore throat and itchy throat, but believes due to seasonal allergies and pollen. He denies trouble swallowing foods, but has occasional trouble with medications. He hasn't seen ENT for this issue. He denies taking Vitamin supplements.   Past Medical History:  Diagnosis Date  . Barrett's esophagus   . CAD (coronary artery disease)    a. NSTEMI 05/2014 - occluded RCA dominant proximal s/p asp-thrombectomy/DES to RCA, minimal LAD/LCx, EF 50% by cath, 65-70% by echo.  . Depression   . Diabetes type 2, uncontrolled ([place])   . Former tobacco use   . GERD (gastroesophageal reflux disease)   . Hemorrhoids    hx of  . Hypercholesterolemia   . Hypertension   . MI (myocardial infarction) ([name])   . Peripheral neuropathy    Past  Surgical History:  Procedure Laterality Date  . LEFT HEART CATHETERIZATION WITH CORONARY ANGIOGRAM N/A 06/14/2014   Procedure: LEFT HEART CATHETERIZATION WITH CORONARY ANGIOGRAM;  Surgeon: Jettie Booze, MD;  Location: [facility] CATH LAB;  Service: Cardiovascular;  Laterality: N/A;  . TONSILLECTOMY     Prior to Admission medications   Medication Sig Start Date End Date Taking? Authorizing Provider  [name] glucose meter kit and supplies KIT Dispense based on patient and insurance preference. Use four times daily as directed. ICD10 E11.65,  Z79.4 06/25/17   Shawnee Knapp, MD  EQ ASPIRIN ADULT LOW DOSE 81 MG EC tablet TAKE ONE TABLET BY MOUTH ONCE DAILY 07/02/15   Jettie Booze, MD  gabapentin (NEURONTIN) 100 MG capsule Take Two Capsules in the Morning  and Bedtime 11/25/17   Bayard Hugger, NP  HYDROcodone-acetaminophen (NORCO) 7.5-325 MG tablet Take 1 tablet by mouth every 6 (six) hours as needed for moderate pain. 03/10/18   Bayard Hugger, NP  insulin aspart (NOVOLOG FLEXPEN) 100 UNIT/ML FlexPen INJECT 30 TO 35 UNITS SUBCUTANEOUSLY THREE TIMES DAILY 02/18/18   Shawnee Knapp, MD  insulin detemir (LEVEMIR) 100 UNIT/ML injection Inject 40 Units into the skin 2 (two) times daily.    [provider]  Insulin Pen Needle (B-D UF III MINI PEN NEEDLES) 31G X 5 MM MISC Use to inject insulin 5 pen needles per day 06/25/17   Shawnee Knapp, MD  lisinopril (PRINIVIL,ZESTRIL) 2.5 MG tablet Take 1 tablet (2.5 mg total) by mouth daily. 06/25/17   Shawnee Knapp, MD  metoprolol tartrate (LOPRESSOR) 25 MG tablet Take 0.5 tablets (12.5 mg total) by mouth 2 (two) times daily. 06/25/17   Shawnee Knapp, MD  nitroGLYCERIN (NITROSTAT) 0.4 MG SL tablet Place 1 tablet (0.4 mg total) under the tongue every 5 (five) minutes as needed for chest pain (up to 3 doses). 11/10/16   Jettie Booze, MD  pantoprazole (PROTONIX) 40 MG tablet Take 1 tablet (40 mg total) by mouth daily. 06/25/17   Shawnee Knapp, MD  prasugrel (EFFIENT) 10 MG TABS tablet Take 1 tablet (10 mg total) by mouth daily. 06/25/17   Shawnee Knapp, MD  rosuvastatin (CRESTOR) 40 MG tablet TAKE 1 TABLET BY MOUTH ONCE DAILY 11/01/17   Shawnee Knapp, MD  Skin Protectants, Misc. (ABSORBASE EX) Apply 1 application topically daily as needed (MUSCLE ACHES).     [provider]   No Known Allergies Family History  Problem Relation Age of Onset  . Hypertension Mother   . Alzheimer's disease Mother   . Alcohol abuse Father   . Cancer Father        "Throat"  . Diabetes type II Brother   . Heart  disease Neg Hx    Social History   Socioeconomic History  . Marital status: Married    Spouse name: Not on file  . Number of children: Not on file  . Years of education: Not on file  . Highest education level: Not on file  Occupational History  . Occupation: unemployed    Comment: applying for disability  Social Needs  . Financial resource strain: Not on file  . Food insecurity:    Worry: Not on file    Inability: Not on file  . Transportation needs:    Medical: Not on file    Non-medical: Not on file  Tobacco Use  . Smoking status: Former Smoker    Packs/day: 0.25    Years: 3.00  Pack years: 0.75    Last attempt to quit: 11/30/1998    Years since quitting: 19.2  . Smokeless tobacco: Never Used  Substance and Sexual Activity  . Alcohol use: No    Alcohol/week: 0.5 oz    Types: 1 Standard drinks or equivalent per week  . Drug use: No  . Sexual activity: Yes    Birth control/protection: None  Lifestyle  . Physical activity:    Days per week: Not on file    Minutes per session: Not on file  . Stress: Not on file  Relationships  . Social connections:    Talks on phone: Not on file    Gets together: Not on file    Attends religious service: Not on file    Active member of club or organization: Not on file    Attends meetings of clubs or organizations: Not on file    Relationship status: Not on file  Other Topics Concern  . Not on file  Social History Narrative  . Not on file   Depression screen [facility] 2/9 03/11/2018 10/01/2017 09/29/2017 03/08/2017 02/08/2017  Decreased Interest 0 0 0 0 0  Down, Depressed, Hopeless 0 0 0 0 0  PHQ - 2 Score 0 0 0 0 0  Altered sleeping - - - - -  Tired, decreased energy - - - - -  Change in appetite - - - - -  Feeling bad or failure about yourself  - - - - -  Trouble concentrating - - - - -  Moving slowly or fidgety/restless - - - - -  Suicidal thoughts - - - - -  PHQ-9 Score - - - - -  Difficult doing work/chores - - - - -     Review of Systems  Constitutional: Negative for fatigue and unexpected weight change.  Eyes: Negative for visual disturbance.  Respiratory: Negative for cough, chest tightness and shortness of breath.   Cardiovascular: Negative for chest pain, palpitations and leg swelling.  Gastrointestinal: Negative for abdominal pain and [name] in stool.  Endocrine: Positive for polydipsia.  Musculoskeletal: Positive for myalgias.  Neurological: Negative for dizziness, light-headedness and headaches.       Objective:   Physical Exam  Constitutional: He is oriented to person, place, and time. He appears well-developed and well-nourished. No distress.  HENT:  Head: Normocephalic and atraumatic.  Eyes: Pupils are equal, round, and reactive to light. EOM are normal.  Neck: Neck supple. Thyromegaly (slightly enlarged) present.  Cardiovascular: Normal rate and regular rhythm.  Pulmonary/Chest: Effort normal. No respiratory distress.  Musculoskeletal: Normal range of motion.  Neurological: He is alert and oriented to person, place, and time.  Skin: Skin is warm and dry.  Psychiatric: He has a normal mood and affect. His behavior is normal.  Nursing note and vitals reviewed.   BP 122/66 (BP Location: Left Arm, Patient Position: Sitting, Cuff Size: Large)   Pulse 88   Temp 98 F (36.7 C) (Oral)   Resp 18   Ht _0  (1.753 m)   Wt 233 lb 3.2 oz (105.8 kg)   SpO2 96%   BMI 34.44 kg/m    Results for orders placed or performed in visit on 03/11/18  POCT urinalysis dipstick  Result Value Ref Range   Color, UA yellow yellow   Clarity, UA clear clear   Glucose, UA =250 (A) negative mg/dL   Bilirubin, UA negative negative   Ketones, POC UA trace (5) (A) negative mg/dL  Spec Grav, UA >=1.030 (A) 1.010 - 1.025   [name], UA trace-lysed (A) negative   pH, UA 5.5 5.0 - 8.0   Protein Ur, POC =30 (A) negative mg/dL   Urobilinogen, UA 0.2 0.2 or 1.0 E.U./dL   Nitrite, UA Negative Negative    Leukocytes, UA Negative Negative  POCT glycosylated hemoglobin (Hb A1C)  Result Value Ref Range   Hemoglobin A1C 9.0   POCT glucose (manual entry)  Result Value Ref Range   POC Glucose 168 (A) 70 - 99 mg/dl  POCT CBC  Result Value Ref Range   WBC 6.2 4.6 - 10.2 K/uL   Lymph, poc 2.3 0.6 - 3.4   POC LYMPH PERCENT 37.6 10 - 50 %L   MID (cbc) 0.3 0 - 0.9   POC MID % 4.6 0 - 12 %M   POC Granulocyte 3.6 2 - 6.9   Granulocyte percent 57.8 37 - 80 %G   RBC 4.85 4.69 - 6.13 M/uL   Hemoglobin 13.6 (A) 14.1 - 18.1 g/dL   HCT, POC 41.2 (A) 43.5 - 53.7 %   MCV 85.0 80 - 97 fL   MCH, POC 28.1 27 - 31.2 pg   MCHC 33.0 31.8 - 35.4 g/dL   RDW, POC 13.7 %   Platelet Count, POC 293 142 - 424 K/uL   MPV 7.4 0 - 99.8 fL        Assessment & Plan:   1. Uncontrolled type 1 diabetes mellitus with hyperglycemia (HCC) - needs to re-establish w/ endocrine - management of his labile, brittle DM really requires a specalist - strongly emphaiszed to pt and referral placed. Ranging from 150-400s when checking both a noon and qhs w/ some hypoglyemic episodes so I really do not have any idea to improve his current control w/o additional cbg monitoring - needs to check more but really hates to as fingers get to sore to type (he is a Sport and exercise psychologist) and is expensive with all the strips so highly recommend he try to obtain a new freestyle libre meter that has a 14d sensor and the reader device reports cbgs when waves over.  For now, continue same prior regimen of levemir 40 bid - but advised to try increasing qam levemir to 45u - cont with ssi novolog 20-35u tidac. Cont crestor 40 (not fasting today), lisinoprl 12.5  2. Enlarged thyroid gland   3. Pill dysphagia - refer to ENT due to +FHx of laryngeal cancer in 2 first degree relatives (father, brother)  37. Vitamin D deficiency   5. Fatigue, unspecified type   6. Family history of throat cancer   7. Labile [name] glucose   8. Brittle diabetes mellitus ([place])   9.  Encounter for long-term current use of medication   10. Long term current use of oral hypoglycemic drug   11. Other polyneuropathy   12. Disturbance of skin sensation   13.    Coronary artery disease involving native coronary artery of native heart with other form of angina pectoris - cont crestor 40, lisinopril 2.5, metoprolol 12.5 bid, asa, and effient.  Orders Placed This Encounter  Procedures  . Comprehensive metabolic panel  . TSH+T4F+T3Free  . Vitamin B12  . VITAMIN D 25 Hydroxy (Vit-D Deficiency, Fractures)  . Ambulatory referral to ENT    Referral Priority:   Routine    Referral Type:   Consultation    Referral Reason:   Specialty Services Required    Referred to Provider:   Leta Baptist,  MD    Requested Specialty:   Otolaryngology    Number of Visits Requested:   1  . Ambulatory referral to Endocrinology    Referral Priority:   Routine    Referral Type:   Consultation    Referral Reason:   Specialty Services Required    Number of Visits Requested:   1  . POCT urinalysis dipstick  . POCT glycosylated hemoglobin (Hb A1C)  . POCT glucose (manual entry)  . POCT CBC    Meds ordered this encounter  Medications  . insulin aspart (NOVOLOG FLEXPEN) 100 UNIT/ML FlexPen    Sig: INJECT 30 TO 35 UNITS SUBCUTANEOUSLY THREE TIMES DAILY    Dispense:  30 pen    Refill:  1    Please fill a 3 month supply at a time  . Insulin Pen Needle (B-D UF III MINI PEN NEEDLES) 31G X 5 MM MISC    Sig: Use to inject insulin 5 pen needles per day    Dispense:  150 each    Refill:  2  . LEVEMIR FLEXTOUCH 100 UNIT/ML Pen    Sig: Inject [place] 45u qam and 40u qhs.    Dispense:  90 mL    Refill:  1  . lisinopril (PRINIVIL,ZESTRIL) 2.5 MG tablet    Sig: Take 1 tablet (2.5 mg total) by mouth daily.    Dispense:  90 tablet    Refill:  1  . metoprolol tartrate (LOPRESSOR) 25 MG tablet    Sig: Take 0.5 tablets (12.5 mg total) by mouth 2 (two) times daily.    Dispense:  90 tablet    Refill:  1  .  prasugrel (EFFIENT) 10 MG TABS tablet    Sig: Take 1 tablet (10 mg total) by mouth daily.    Dispense:  90 tablet    Refill:  1    Please consider 90 day supplies to promote better adherence  . rosuvastatin (CRESTOR) 40 MG tablet    Sig: Take 1 tablet (40 mg total) by mouth daily.    Dispense:  90 tablet    Refill:  1    I personally performed the services described in this documentation, which was scribed in my presence. The recorded information has been reviewed and considered, and addended by me as needed.   [name], M.D.  Primary Care at [facility] [address] [place], [place] [address] [phone] phone [phone] fax  04/07/18 12:30 AM

## 2018-03-11 NOTE — Patient Instructions (Signed)
     IF you received an x-ray today, you will receive an invoice from [facility]. Please contact [facility] at [phone] with questions or concerns regarding your invoice.   IF you received labwork today, you will receive an invoice from [facility]. Please contact [facility] at [phone] with questions or concerns regarding your invoice.   Our billing staff will not be able to assist you with questions regarding bills from these companies.  You will be contacted with the lab results as soon as they are available. The fastest way to get your results is to activate your My Chart account. Instructions are located on the last page of this paperwork. If you have not heard from us regarding the results in 2 weeks, please contact this office.     

## 2018-03-12 LAB — COMPREHENSIVE METABOLIC PANEL
A/G RATIO: 1.9 (ref 1.2–2.2)
ALBUMIN: 4.2 g/dL (ref 3.5–5.5)
ALT: 15 IU/L (ref 0–44)
AST: 18 IU/L (ref 0–40)
Alkaline Phosphatase: 132 IU/L — ABNORMAL HIGH (ref 39–117)
BILIRUBIN TOTAL: 0.3 mg/dL (ref 0.0–1.2)
BUN / CREAT RATIO: 15 (ref 9–20)
BUN: 12 mg/dL (ref 6–24)
CALCIUM: 9.1 mg/dL (ref 8.7–10.2)
CHLORIDE: 106 mmol/L (ref 96–106)
CO2: 20 mmol/L (ref 20–29)
Creatinine, Ser: 0.82 mg/dL (ref 0.76–1.27)
GFR, EST AFRICAN AMERICAN: 112 mL/min/{1.73_m2} (ref >59)
GFR, EST NON AFRICAN AMERICAN: 97 mL/min/{1.73_m2} (ref >59)
GLUCOSE: 166 mg/dL — AB (ref 65–99)
Globulin, Total: 2.2 g/dL (ref 1.5–4.5)
Potassium: 4.2 mmol/L (ref 3.5–5.2)
Sodium: 140 mmol/L (ref 134–144)
TOTAL PROTEIN: 6.4 g/dL (ref 6.0–8.5)

## 2018-03-12 LAB — VITAMIN B12: VITAMIN B 12: 437 pg/mL (ref 232–1245)

## 2018-03-12 LAB — TSH+T4F+T3FREE
FREE T4: 1.03 ng/dL (ref 0.82–1.77)
T3 FREE: 2.9 pg/mL (ref 2.0–4.4)
TSH: 1.28 u[IU]/mL (ref 0.450–4.500)

## 2018-03-12 LAB — VITAMIN D 25 HYDROXY (VIT D DEFICIENCY, FRACTURES): Vit D, 25-Hydroxy: 9.4 ng/mL — ABNORMAL LOW (ref 30.0–100.0)

## 2018-03-14 ENCOUNTER — Other Ambulatory Visit: Payer: Self-pay | Admitting: Family Medicine

## 2018-03-16 ENCOUNTER — Ambulatory Visit: Payer: Medicare HMO | Admitting: Registered Nurse

## 2018-04-01 ENCOUNTER — Telehealth: Payer: Self-pay | Admitting: Family Medicine

## 2018-04-01 NOTE — Telephone Encounter (Signed)
Copied from CRM [id number]. Topic: Quick Communication - See Telephone Encounter >> Apr 01, 2018  1:42 PM Oneal Grout wrote: CRM for notification. See Telephone encounter for: 04/01/18.Mia from [employer], need to speak to someone free style meter.

## 2018-04-01 NOTE — Telephone Encounter (Signed)
Phone call to [employer]. Detailed voice message left regarding patient. What questions or message do they have regarding free style meter? Please call back and advise.

## 2018-04-07 DIAGNOSIS — E049 Nontoxic goiter, unspecified: Secondary | ICD-10-CM | POA: Insufficient documentation

## 2018-04-07 DIAGNOSIS — E109 Type 1 diabetes mellitus without complications: Secondary | ICD-10-CM | POA: Insufficient documentation

## 2018-04-07 DIAGNOSIS — R7309 Other abnormal glucose: Secondary | ICD-10-CM | POA: Insufficient documentation

## 2018-04-07 DIAGNOSIS — R1310 Dysphagia, unspecified: Secondary | ICD-10-CM | POA: Insufficient documentation

## 2018-04-07 DIAGNOSIS — E559 Vitamin D deficiency, unspecified: Secondary | ICD-10-CM | POA: Insufficient documentation

## 2018-04-07 MED ORDER — VITAMIN D (ERGOCALCIFEROL) 1.25 MG (50000 UNIT) PO CAPS: 50000.0000 [IU] | ORAL_CAPSULE | ORAL | 1 refills | Status: DC

## 2018-04-08 ENCOUNTER — Other Ambulatory Visit: Payer: Self-pay | Admitting: Family Medicine

## 2018-04-12 ENCOUNTER — Encounter: Payer: Medicare HMO | Attending: Physical Medicine & Rehabilitation | Admitting: Physical Medicine & Rehabilitation

## 2018-04-12 ENCOUNTER — Encounter: Payer: Self-pay | Admitting: Physical Medicine & Rehabilitation

## 2018-04-12 ENCOUNTER — Other Ambulatory Visit: Payer: Self-pay

## 2018-04-12 VITALS — BP 144/85 | HR 104 | Ht 69.0 in | Wt 227.8 lb

## 2018-04-12 DIAGNOSIS — Z79899 Other long term (current) drug therapy: Secondary | ICD-10-CM | POA: Insufficient documentation

## 2018-04-12 DIAGNOSIS — M799 Soft tissue disorder, unspecified: Secondary | ICD-10-CM | POA: Insufficient documentation

## 2018-04-12 DIAGNOSIS — G894 Chronic pain syndrome: Secondary | ICD-10-CM | POA: Insufficient documentation

## 2018-04-12 DIAGNOSIS — M5416 Radiculopathy, lumbar region: Secondary | ICD-10-CM

## 2018-04-12 DIAGNOSIS — M545 Low back pain, unspecified: Secondary | ICD-10-CM | POA: Insufficient documentation

## 2018-04-12 DIAGNOSIS — Z5181 Encounter for therapeutic drug level monitoring: Secondary | ICD-10-CM | POA: Diagnosis not present

## 2018-04-12 DIAGNOSIS — M25551 Pain in right hip: Secondary | ICD-10-CM | POA: Diagnosis not present

## 2018-04-12 DIAGNOSIS — E119 Type 2 diabetes mellitus without complications: Secondary | ICD-10-CM | POA: Insufficient documentation

## 2018-04-12 DIAGNOSIS — M7989 Other specified soft tissue disorders: Secondary | ICD-10-CM | POA: Insufficient documentation

## 2018-04-12 DIAGNOSIS — G63 Polyneuropathy in diseases classified elsewhere: Secondary | ICD-10-CM | POA: Diagnosis not present

## 2018-04-12 DIAGNOSIS — M25552 Pain in left hip: Secondary | ICD-10-CM | POA: Diagnosis not present

## 2018-04-12 MED ORDER — HYDROCODONE-ACETAMINOPHEN 7.5-325 MG PO TABS: 1.0000 | ORAL_TABLET | Freq: Four times a day (QID) | ORAL | 0 refills | Status: DC | PRN

## 2018-04-12 NOTE — Patient Instructions (Signed)
PLEASE FEEL FREE TO CALL OUR OFFICE WITH ANY PROBLEMS OR QUESTIONS ([phone])      

## 2018-04-12 NOTE — Progress Notes (Signed)
Subjective:    Patient ID: [name] [name], male    DOB: [birth year], 62 y.o.   MRN: [id number]  HPI   [name] [name] is here in follow-up of his chronic bilateral hip and low back pain. He continues to be followed by ortho for his hips. He has injection planned for his left soon. His hip replacements are still on hold as he's in an apartment which is on a second floor.  He still contemplating surgery perhaps at the beginning of next year.  He is having increased pain in his low back which bothers him when he sits for too long and then tries to stand up. Also prolonged standing and more often, prolonged walking, can worsen the symptoms. The pain  doesn't really radiate.   He is having a lot of anxiety regarding some final examinations he's in the middle of. He's considering getting his PHD.    He remains on hydrocodone for his baseline pain control.  Is also using gabapentin 200 mg twice daily  Pain Inventory Average Pain 4 Pain Right Now 6 My pain is stabbing and aching  In the last 24 hours, has pain interfered with the following? General activity 4 Relation with others 4 Enjoyment of life 2 What TIME of day is your pain at its worst? morning night Sleep (in general) Poor  Pain is worse with: walking and inactivity Pain improves with: rest, medication and injections Relief from Meds: 7  Mobility walk without assistance how many minutes can you walk? 10 ability to climb steps?  yes do you drive?  yes  Function disabled: date disabled 4/11 I need assistance with the following:  dressing Do you have any goals in this area?  yes  Neuro/Psych weakness numbness tingling spasms anxiety  Prior Studies Any changes since last visit?  no  Physicians involved in your care Any changes since last visit?  no   Family History  Problem Relation Age of Onset  . Hypertension Mother   . Alzheimer's disease Mother   . Alcohol abuse Father   . Cancer Father 73       "Throat"  .  Diabetes type II Brother   . Cancer Brother 68       throat cancer  . Heart disease Neg Hx    Social History   Socioeconomic History  . Marital status: Married    Spouse name: Not on file  . Number of children: Not on file  . Years of education: Not on file  . Highest education level: Not on file  Occupational History  . Occupation: unemployed    Comment: applying for disability  Social Needs  . Financial resource strain: Not on file  . Food insecurity:    Worry: Not on file    Inability: Not on file  . Transportation needs:    Medical: Not on file    Non-medical: Not on file  Tobacco Use  . Smoking status: Former Smoker    Packs/day: 0.25    Years: 3.00    Pack years: 0.75    Last attempt to quit: 11/30/1998    Years since quitting: 19.3  . Smokeless tobacco: Never Used  Substance and Sexual Activity  . Alcohol use: No    Alcohol/week: 0.5 oz    Types: 1 Standard drinks or equivalent per week  . Drug use: No  . Sexual activity: Yes    Birth control/protection: None  Lifestyle  . Physical activity:    Days per week:  Not on file    Minutes per session: Not on file  . Stress: Not on file  Relationships  . Social connections:    Talks on phone: Not on file    Gets together: Not on file    Attends religious service: Not on file    Active member of club or organization: Not on file    Attends meetings of clubs or organizations: Not on file    Relationship status: Not on file  Other Topics Concern  . Not on file  Social History Narrative  . Not on file   Past Surgical History:  Procedure Laterality Date  . LEFT HEART CATHETERIZATION WITH CORONARY ANGIOGRAM N/A 06/14/2014   Procedure: LEFT HEART CATHETERIZATION WITH CORONARY ANGIOGRAM;  Surgeon: Corky Crafts, MD;  Location: [facility] CATH LAB;  Service: Cardiovascular;  Laterality: N/A;  . TONSILLECTOMY     Past Medical History:  Diagnosis Date  . Barrett's esophagus   . CAD (coronary artery disease)    a.  NSTEMI 05/2014 - occluded RCA dominant proximal s/p asp-thrombectomy/DES to RCA, minimal LAD/LCx, EF 50% by cath, 65-70% by echo.  . Depression   . Diabetes type 2, uncontrolled (HCC)   . Former tobacco use   . GERD (gastroesophageal reflux disease)   . Hemorrhoids    hx of  . Hypercholesterolemia   . Hypertension   . MI (myocardial infarction) (HCC)   . Peripheral neuropathy    BP (!) 144/85   Pulse (!) 104   Ht 5\' 9"  (1.753 m) Comment: pt reported  Wt 227 lb 12.8 oz (103.3 kg)   SpO2 95%   BMI 33.64 kg/m   Opioid Risk Score:   Fall Risk Score:  `1  Depression screen PHQ 2/9  Depression screen [facility] 2/9 04/12/2018 03/11/2018 10/01/2017 09/29/2017 03/08/2017 02/08/2017 09/15/2016  Decreased Interest 0 0 0 0 0 0 0  Down, Depressed, Hopeless 0 0 0 0 0 0 0  PHQ - 2 Score 0 0 0 0 0 0 0  Altered sleeping - - - - - - -  Tired, decreased energy - - - - - - -  Change in appetite - - - - - - -  Feeling bad or failure about yourself  - - - - - - -  Trouble concentrating - - - - - - -  Moving slowly or fidgety/restless - - - - - - -  Suicidal thoughts - - - - - - -  PHQ-9 Score - - - - - - -  Difficult doing work/chores - - - - - - -    Review of Systems  Constitutional: Negative.   HENT: Negative.   Eyes: Negative.   Respiratory: Negative.   Cardiovascular: Negative.   Gastrointestinal: Negative.   Endocrine: Negative.   Genitourinary: Negative.   Musculoskeletal: Negative.   Skin: Negative.   Allergic/Immunologic: Negative.   Neurological: Negative.   Hematological: Negative.   Psychiatric/Behavioral: Negative.   All other systems reviewed and are negative.      Objective:   Physical Exam General: No acute distress HEENT: EOMI, oral membranes moist Cards: reg rate  Chest: normal effort Abdomen: Soft, NT, ND Skin: dry, intact Extremities: no edema she is got also food Skin:Clean and intact without signs of breakdown Neuro:Pt is cognitively appropriate with normal  insight, memory, and awareness. Cranial nerves 2-12 are intact. Sensory exam appears grosslynormal. Reflexes are 2+ in all 4's. Fine motor coordination is intact. No tremors. Motor function is  grossly 5/5.  Musculoskeletal:Patient with ongoing pain during hip external and internal rotation.  Patient walks with a very rigid low back.  He tends to favor the left leg more than right during stance.  He is able to bend to about 45 degrees before he had significant pain.  Extension cause discomfort as well.  Facet testing was positive right and left.  Rotation lateral bending cause mild discomfort.  He is point tender along the lower lumbar segments particularly L4 and L5.  Some associated spasm was also noted. Psych:Pts behavior normal.        Assessment & Plan:  1. Bilateral hip pain. 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  2. Left medial calf cyst. History of previous inflammation.--resolved 3. Type II DM 4.  Increased low back pain, likely spondylosis with facet arthropathy    Plan: 1. [place] at 5/325 one q6prn #120.We will continue the controlled substance monitoring program, this consists of regular clinic visits, examinations, routine drug screening, pill counts as well as use of [place] Controlled Substance Reporting System. [place] today.    2.  Ordered x-rays of the lumbar spine.  Also provide the patient basic facet exercises and stretches to start with.  His hip certainly affects his back as well but likely not to the point where it is causing substantial pain. 3. [name] regarding right hip replacement--He will potentially pursue hip replacement next summer after he finishes school.  4.  Diet remains important for blood pressure 5. May continue low dose gabapentin 100mg  bid to qhs for cramping symptoms--this continues to be effective  15 minutesof face to face patient care time were spent during  this visit. All questions were encouraged and answered. Will follow up in one month with NP.

## 2018-04-20 ENCOUNTER — Ambulatory Visit: Payer: Medicare HMO | Admitting: Physical Medicine & Rehabilitation

## 2018-05-03 ENCOUNTER — Ambulatory Visit
Admission: RE | Admit: 2018-05-03 | Discharge: 2018-05-03 | Disposition: A | Discharge status: Home / Self Care | Payer: Medicare HMO | Source: Ambulatory Visit | Attending: Physical Medicine & Rehabilitation | Admitting: Physical Medicine & Rehabilitation

## 2018-05-03 DIAGNOSIS — M545 Low back pain, unspecified: Secondary | ICD-10-CM

## 2018-05-03 DIAGNOSIS — M48061 Spinal stenosis, lumbar region without neurogenic claudication: Secondary | ICD-10-CM | POA: Diagnosis not present

## 2018-05-11 ENCOUNTER — Encounter: Payer: Self-pay | Admitting: Physical Medicine & Rehabilitation

## 2018-05-11 ENCOUNTER — Encounter: Payer: Medicare HMO | Attending: Physical Medicine & Rehabilitation | Admitting: Physical Medicine & Rehabilitation

## 2018-05-11 VITALS — BP 118/78 | HR 84 | Ht 69.0 in | Wt 232.8 lb

## 2018-05-11 DIAGNOSIS — M799 Soft tissue disorder, unspecified: Secondary | ICD-10-CM | POA: Diagnosis not present

## 2018-05-11 DIAGNOSIS — M25552 Pain in left hip: Secondary | ICD-10-CM | POA: Diagnosis not present

## 2018-05-11 DIAGNOSIS — G63 Polyneuropathy in diseases classified elsewhere: Secondary | ICD-10-CM

## 2018-05-11 DIAGNOSIS — M25551 Pain in right hip: Secondary | ICD-10-CM | POA: Diagnosis not present

## 2018-05-11 DIAGNOSIS — Z79891 Long term (current) use of opiate analgesic: Secondary | ICD-10-CM

## 2018-05-11 DIAGNOSIS — E119 Type 2 diabetes mellitus without complications: Secondary | ICD-10-CM | POA: Insufficient documentation

## 2018-05-11 DIAGNOSIS — G894 Chronic pain syndrome: Secondary | ICD-10-CM | POA: Diagnosis not present

## 2018-05-11 DIAGNOSIS — M545 Low back pain, unspecified: Secondary | ICD-10-CM

## 2018-05-11 DIAGNOSIS — Z79899 Other long term (current) drug therapy: Secondary | ICD-10-CM | POA: Insufficient documentation

## 2018-05-11 DIAGNOSIS — M7989 Other specified soft tissue disorders: Secondary | ICD-10-CM | POA: Diagnosis not present

## 2018-05-11 DIAGNOSIS — Z5181 Encounter for therapeutic drug level monitoring: Secondary | ICD-10-CM | POA: Diagnosis not present

## 2018-05-11 MED ORDER — HYDROCODONE-ACETAMINOPHEN 7.5-325 MG PO TABS: 1.0000 | ORAL_TABLET | Freq: Four times a day (QID) | ORAL | 0 refills | Status: DC | PRN

## 2018-05-11 NOTE — Patient Instructions (Signed)
CONTINUE TO WORK ON STRETCHES, GOOD MECHANICS, TAKE REST BREAKS DURING ACTIVITIES AS YOU CAN

## 2018-05-11 NOTE — Progress Notes (Signed)
Subjective:    Patient ID: [name] [name], male    DOB: 1956/05/03, 62 y.o.   MRN: [id number]  HPI   [name] [name] is here for follow-up of his chronic back and hip pain.  I last saw him a month ago.  I ordered x-rays of his lumbar spine which were completed last week.  X-rays demonstrated mild disc space narrowing at L4-L5 as well as facet arthropathy at L4-L5 and L5-S1.  Lumbar stretching exercises were provided at last visit.  He states that his back has been a bit better.  He is doing the stretches.  He does notice that when he stands or sits for long periods of time that his back will tighten up.  He continues to use hydrocodone for breakthrough pain.  He also is on low-dose gabapentin.  He just finished up the spring semester and is taking calculus this summer.  After that he only has one class to complete to get his degree.   Pain Inventory Average Pain 4 Pain Right Now 5 My pain is constant, sharp and aching  In the last 24 hours, has pain interfered with the following? General activity 5 Relation with others 3 Enjoyment of life 3 What TIME of day is your pain at its worst? morning Sleep (in general) Poor  Pain is worse with: walking and inactivity Pain improves with: medication and injections Relief from Meds: 8  Mobility walk without assistance how many minutes can you walk? 10 ability to climb steps?  yes do you drive?  yes  Function disabled: date disabled 02/2010  Neuro/Psych weakness numbness tingling spasms  Prior Studies x-rays  Physicians involved in your care Any changes since last visit?  no   Family History  Problem Relation Age of Onset  . Hypertension Mother   . Alzheimer's disease Mother   . Alcohol abuse Father   . Cancer Father 1       "Throat"  . Diabetes type II Brother   . Cancer Brother 68       throat cancer  . Heart disease Neg Hx    Social History   Socioeconomic History  . Marital status: Married    Spouse name: Not on file    . Number of children: Not on file  . Years of education: Not on file  . Highest education level: Not on file  Occupational History  . Occupation: unemployed    Comment: applying for disability  Social Needs  . Financial resource strain: Not on file  . Food insecurity:    Worry: Not on file    Inability: Not on file  . Transportation needs:    Medical: Not on file    Non-medical: Not on file  Tobacco Use  . Smoking status: Former Smoker    Packs/day: 0.25    Years: 3.00    Pack years: 0.75    Last attempt to quit: 11/30/1998    Years since quitting: 19.4  . Smokeless tobacco: Never Used  Substance and Sexual Activity  . Alcohol use: No    Alcohol/week: 0.5 oz    Types: 1 Standard drinks or equivalent per week  . Drug use: No  . Sexual activity: Yes    Birth control/protection: None  Lifestyle  . Physical activity:    Days per week: Not on file    Minutes per session: Not on file  . Stress: Not on file  Relationships  . Social connections:    Talks on phone: Not  on file    Gets together: Not on file    Attends religious service: Not on file    Active member of club or organization: Not on file    Attends meetings of clubs or organizations: Not on file    Relationship status: Not on file  Other Topics Concern  . Not on file  Social History Narrative  . Not on file   Past Surgical History:  Procedure Laterality Date  . LEFT HEART CATHETERIZATION WITH CORONARY ANGIOGRAM N/A 06/14/2014   Procedure: LEFT HEART CATHETERIZATION WITH CORONARY ANGIOGRAM;  Surgeon: Corky Crafts, MD;  Location: [facility] CATH LAB;  Service: Cardiovascular;  Laterality: N/A;  . TONSILLECTOMY     Past Medical History:  Diagnosis Date  . Barrett's esophagus   . CAD (coronary artery disease)    a. NSTEMI 05/2014 - occluded RCA dominant proximal s/p asp-thrombectomy/DES to RCA, minimal LAD/LCx, EF 50% by cath, 65-70% by echo.  . Depression   . Diabetes type 2, uncontrolled (HCC)   . Former  tobacco use   . GERD (gastroesophageal reflux disease)   . Hemorrhoids    hx of  . Hypercholesterolemia   . Hypertension   . MI (myocardial infarction) (HCC)   . Peripheral neuropathy    BP 118/78   Pulse 84   Ht 5\' 9"  (1.753 m)   Wt 232 lb 12.8 oz (105.6 kg)   SpO2 96%   BMI 34.38 kg/m    Opioid Risk Score:   Fall Risk Score:  `1  Depression screen PHQ 2/9  Depression screen [facility] 2/9 04/12/2018 03/11/2018 10/01/2017 09/29/2017 03/08/2017 02/08/2017 09/15/2016  Decreased Interest 0 0 0 0 0 0 0  Down, Depressed, Hopeless 0 0 0 0 0 0 0  PHQ - 2 Score 0 0 0 0 0 0 0  Altered sleeping - - - - - - -  Tired, decreased energy - - - - - - -  Change in appetite - - - - - - -  Feeling bad or failure about yourself  - - - - - - -  Trouble concentrating - - - - - - -  Moving slowly or fidgety/restless - - - - - - -  Suicidal thoughts - - - - - - -  PHQ-9 Score - - - - - - -  Difficult doing work/chores - - - - - - -   Review of Systems  Constitutional: Negative.   HENT: Negative.   Eyes: Negative.   Respiratory: Negative.   Cardiovascular: Negative.   Gastrointestinal: Negative.   Endocrine: Negative.   Genitourinary: Negative.   Musculoskeletal: Negative.   Skin: Negative.   Allergic/Immunologic: Negative.   Neurological: Negative.   Hematological: Negative.   Psychiatric/Behavioral: Negative.   All other systems reviewed and are negative.      Objective:   Physical Exam  General: No acute distress HEENT: EOMI, oral membranes moist Cards: reg rate  Chest: normal effort Abdomen: Soft, NT, ND Skin: dry, intact Extremities: no edema Neuro:Pt is cognitively appropriate with normal insight, memory, and awareness. Cranial nerves 2-12 are intact. Sensory exam appears grosslynormal. Reflexes are 2+ in all 4's. Fine motor coordination is intact. No tremors. Motor function is grossly 5/5.  Musculoskeletal:extension causes more pain than flexion.  Facet testing remains positive  bilaterally.  Rotation lateral bending cause mild discomfort.  He is point tender along the lower lumbar segments particularly L4 and L5.  Some associated spasm was also noted. Psych:Pts behavior normal.  Assessment & Plan:  1. Bilateral hip pain. 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  2. Left medial calf cyst. History of previous inflammation.--resolved 3. Type II DM 4.  Increased low back pain, likely spondylosis with facet arthropathy    Plan: 1. Continuenorco at 5/325 one q6prn #120.We will continue the controlled substance monitoring program, this consists of regular clinic visits, examinations, routine drug screening, pill counts as well as use of [place] Controlled Substance Reporting System. [place] today.    -UDS today 2.Reviewed maintenance stretches and mechanics to manage facet pain. He has been provided facet stretches already.  I think he should be able to manage his back pain conservatively.  His x-rays are fairly benign. 3. [name] regarding right hip replacement--He will potentially pursue hip replacementnext summer (2020) after he finishes school. 4.BP control 5. May continue low dose gabapentin 100mg  bid to qhs for cramping symptoms--this continues to be effective  15 minutesof face to face patient care time were spent during this visit. All questions were encouraged and answered. Will follow up in one monthwith NP.

## 2018-05-17 LAB — TOXASSURE SELECT,+ANTIDEPR,UR

## 2018-05-19 ENCOUNTER — Telehealth: Payer: Self-pay | Admitting: *Deleted

## 2018-05-19 NOTE — Telephone Encounter (Signed)
Urine drug screen for this encounter is consistent for prescribed medication 

## 2018-05-27 DIAGNOSIS — R69 Illness, unspecified: Secondary | ICD-10-CM | POA: Diagnosis not present

## 2018-06-10 ENCOUNTER — Other Ambulatory Visit: Payer: Self-pay

## 2018-06-10 ENCOUNTER — Encounter: Payer: Medicare HMO | Attending: Physical Medicine & Rehabilitation | Admitting: Registered Nurse

## 2018-06-10 VITALS — BP 121/76 | HR 91 | Ht 69.0 in | Wt 237.0 lb

## 2018-06-10 DIAGNOSIS — M799 Soft tissue disorder, unspecified: Secondary | ICD-10-CM | POA: Insufficient documentation

## 2018-06-10 DIAGNOSIS — Z79891 Long term (current) use of opiate analgesic: Secondary | ICD-10-CM

## 2018-06-10 DIAGNOSIS — G894 Chronic pain syndrome: Secondary | ICD-10-CM | POA: Diagnosis not present

## 2018-06-10 DIAGNOSIS — M25551 Pain in right hip: Secondary | ICD-10-CM | POA: Diagnosis not present

## 2018-06-10 DIAGNOSIS — M5416 Radiculopathy, lumbar region: Secondary | ICD-10-CM

## 2018-06-10 DIAGNOSIS — M7989 Other specified soft tissue disorders: Secondary | ICD-10-CM | POA: Diagnosis not present

## 2018-06-10 DIAGNOSIS — G63 Polyneuropathy in diseases classified elsewhere: Secondary | ICD-10-CM | POA: Diagnosis not present

## 2018-06-10 DIAGNOSIS — Z5181 Encounter for therapeutic drug level monitoring: Secondary | ICD-10-CM | POA: Insufficient documentation

## 2018-06-10 DIAGNOSIS — E119 Type 2 diabetes mellitus without complications: Secondary | ICD-10-CM | POA: Diagnosis not present

## 2018-06-10 DIAGNOSIS — M25552 Pain in left hip: Secondary | ICD-10-CM

## 2018-06-10 DIAGNOSIS — Z79899 Other long term (current) drug therapy: Secondary | ICD-10-CM | POA: Insufficient documentation

## 2018-06-10 MED ORDER — HYDROCODONE-ACETAMINOPHEN 7.5-325 MG PO TABS: 1.0000 | ORAL_TABLET | Freq: Four times a day (QID) | ORAL | 0 refills | Status: DC | PRN

## 2018-06-10 NOTE — Progress Notes (Signed)
Subjective:    Patient ID: [name] [name], male    DOB: 1955/12/13, 62 y.o.   MRN: [id number]  HPI: [name] [name] is a 62 year old male who returns for follow up appointment for chronic pain and medication refill. He states his pain is located in his lower back radiating into his left lower extremity and bilateral hips. He rates his pain 4. His current exercise regime is walking and performing stretching exercises.  [name] [name] is 30.00 MME. Last UDS was Performed on 05/11/2018 it was consistent.   Pain Inventory Average Pain 6 Pain Right Now 4 My pain is sharp and aching  In the last 24 hours, has pain interfered with the following? General activity 4 Relation with others 3 Enjoyment of life 2 What TIME of day is your pain at its worst? morning night Sleep (in general) Poor  Pain is worse with: walking and inactivity Pain improves with: rest, medication and TENS Relief from Meds: 8  Mobility Do you have any goals in this area?  no  Function disabled: date disabled 4/11  Neuro/Psych weakness numbness tingling spasms  Prior Studies Any changes since last visit?  no  Physicians involved in your care Any changes since last visit?  no   Family History  Problem Relation Age of Onset  . Hypertension Mother   . Alzheimer's disease Mother   . Alcohol abuse Father   . Cancer Father 14       "Throat"  . Diabetes type II Brother   . Cancer Brother 68       throat cancer  . Heart disease Neg Hx    Social History   Socioeconomic History  . Marital status: Married    Spouse name: Not on file  . Number of children: Not on file  . Years of education: Not on file  . Highest education level: Not on file  Occupational History  . Occupation: unemployed    Comment: applying for disability  Social Needs  . Financial resource strain: Not on file  . Food insecurity:    Worry: Not on file    Inability: Not on file  . Transportation needs:   Medical: Not on file    Non-medical: Not on file  Tobacco Use  . Smoking status: Former Smoker    Packs/day: 0.25    Years: 3.00    Pack years: 0.75    Last attempt to quit: 11/30/1998    Years since quitting: 19.5  . Smokeless tobacco: Never Used  Substance and Sexual Activity  . Alcohol use: No    Alcohol/week: 0.6 oz    Types: 1 Standard drinks or [name] per week  . Drug use: No  . Sexual activity: Yes    Birth control/protection: None  Lifestyle  . Physical activity:    Days per week: Not on file    Minutes per session: Not on file  . Stress: Not on file  Relationships  . Social connections:    Talks on phone: Not on file    Gets together: Not on file    Attends religious service: Not on file    Active member of club or organization: Not on file    Attends meetings of clubs or organizations: Not on file    Relationship status: Not on file  Other Topics Concern  . Not on file  Social History Narrative  . Not on file   Past Surgical History:  Procedure Laterality Date  .  LEFT HEART CATHETERIZATION WITH CORONARY ANGIOGRAM N/A 06/14/2014   Procedure: LEFT HEART CATHETERIZATION WITH CORONARY ANGIOGRAM;  Surgeon: Corky Crafts, MD;  Location: [facility] CATH LAB;  Service: Cardiovascular;  Laterality: N/A;  . TONSILLECTOMY     Past Medical History:  Diagnosis Date  . Barrett's esophagus   . CAD (coronary artery disease)    a. NSTEMI 05/2014 - occluded RCA dominant proximal s/p asp-thrombectomy/DES to RCA, minimal LAD/LCx, EF 50% by cath, 65-70% by echo.  . Depression   . Diabetes type 2, uncontrolled (HCC)   . Former tobacco use   . GERD (gastroesophageal reflux disease)   . Hemorrhoids    hx of  . Hypercholesterolemia   . Hypertension   . MI (myocardial infarction) (HCC)   . Peripheral neuropathy    BP 121/76   Pulse 91   Ht 5\' 9"  (1.753 m)   Wt 237 lb (107.5 kg)   SpO2 97%   BMI 35.00 kg/m   Opioid Risk Score:   Fall Risk Score:  `1  Depression screen  PHQ 2/9  Depression screen [facility] 2/9 06/10/2018 04/12/2018 03/11/2018 10/01/2017 09/29/2017 03/08/2017 02/08/2017  Decreased Interest 0 0 0 0 0 0 0  Down, Depressed, Hopeless 0 0 0 0 0 0 0  PHQ - 2 Score 0 0 0 0 0 0 0  Altered sleeping - - - - - - -  Tired, decreased energy - - - - - - -  Change in appetite - - - - - - -  Feeling bad or failure about yourself  - - - - - - -  Trouble concentrating - - - - - - -  Moving slowly or fidgety/restless - - - - - - -  Suicidal thoughts - - - - - - -  PHQ-9 Score - - - - - - -  Difficult doing work/chores - - - - - - -    Review of Systems  Constitutional: Negative.   HENT: Negative.   Eyes: Negative.   Respiratory: Negative.   Cardiovascular: Negative.   Gastrointestinal: Negative.   Endocrine: Negative.   Genitourinary: Negative.   Musculoskeletal: Negative.   Skin: Negative.   Allergic/Immunologic: Negative.   Neurological: Negative.   Hematological: Negative.   Psychiatric/Behavioral: Negative.   All other systems reviewed and are negative.      Objective:   Physical Exam  Constitutional: He is oriented to person, place, and time. He appears well-developed and well-nourished.  HENT:  Head: Normocephalic and atraumatic.  Neck: Normal range of motion. Neck supple.  Cardiovascular: Normal rate and regular rhythm.  Pulmonary/Chest: Effort normal and breath sounds normal.  Musculoskeletal:  Normal Muscle Bulk and Muscle testing Reveals: Upper Extremities: Full ROM and Muscle Strength 5/5 Lumbar Paraspinal Tenderness: L-3-L-5 Bilateral Greater Trochanter Tenderness  Lower Extremities: Full ROM and Muscle Strength 5/5  Arises from Table with Ease Narrow Based Gait  Neurological: He is alert and oriented to person, place, and time.  Skin: Skin is warm and dry.  Psychiatric: He has a normal mood and affect. His behavior is normal.  Nursing note and vitals reviewed.         Assessment & Plan:  1.Bilateral hip pain. MRI evidence  of chondromalacia acetabulae right greater than left. Ortho Following: Dr. Thurston Hole regarding right hip placement.06/10/2018 We will continue with current medication regime. Refilled: Hydrocodone 7.5/325 mg One tablet every 6 hours as needed #120. Second script e-scribe to accommodate scheduled appointment.  We will continue the  opioid monitoring program, this consists of regular clinic visits, examinations, urine drug screen, pill counts as well as use of [place] Controlled Substance Reporting System.  2. Cervicalgia: Ortho Following: No complaints today. Continue with HEP as tolerated. Continue to Monitor. 06/10/2018 3. Type II DM: Polyneuropathy:Continue current medication regime with Gabapentin: 06/10/2018  4. Lumbar Radiculitis: Continue current medication regimen with  Gabapentin. 06/10/2018  20 minutes of face to face patient care time was spent during this visit. All questions were encouraged and answered.  F/U in 1 month

## 2018-06-12 ENCOUNTER — Encounter: Payer: Self-pay | Admitting: Registered Nurse

## 2018-06-30 ENCOUNTER — Other Ambulatory Visit: Payer: Self-pay | Admitting: Family Medicine

## 2018-07-15 ENCOUNTER — Other Ambulatory Visit: Payer: Self-pay | Admitting: Family Medicine

## 2018-07-18 ENCOUNTER — Encounter: Payer: Medicare HMO | Attending: Physical Medicine & Rehabilitation | Admitting: Registered Nurse

## 2018-07-18 DIAGNOSIS — M25552 Pain in left hip: Secondary | ICD-10-CM | POA: Insufficient documentation

## 2018-07-18 DIAGNOSIS — M25551 Pain in right hip: Secondary | ICD-10-CM | POA: Insufficient documentation

## 2018-07-18 DIAGNOSIS — Z79899 Other long term (current) drug therapy: Secondary | ICD-10-CM | POA: Insufficient documentation

## 2018-07-18 DIAGNOSIS — G894 Chronic pain syndrome: Secondary | ICD-10-CM | POA: Insufficient documentation

## 2018-07-18 DIAGNOSIS — M799 Soft tissue disorder, unspecified: Secondary | ICD-10-CM | POA: Insufficient documentation

## 2018-07-18 DIAGNOSIS — M7989 Other specified soft tissue disorders: Secondary | ICD-10-CM | POA: Insufficient documentation

## 2018-07-18 DIAGNOSIS — Z5181 Encounter for therapeutic drug level monitoring: Secondary | ICD-10-CM | POA: Insufficient documentation

## 2018-07-18 DIAGNOSIS — E119 Type 2 diabetes mellitus without complications: Secondary | ICD-10-CM | POA: Insufficient documentation

## 2018-07-25 ENCOUNTER — Encounter: Payer: Medicare HMO | Admitting: Registered Nurse

## 2018-08-08 ENCOUNTER — Encounter: Payer: Medicare HMO | Attending: Physical Medicine & Rehabilitation | Admitting: Registered Nurse

## 2018-08-08 ENCOUNTER — Encounter: Payer: Self-pay | Admitting: Registered Nurse

## 2018-08-08 VITALS — BP 125/79 | HR 88 | Ht 69.0 in | Wt 236.8 lb

## 2018-08-08 DIAGNOSIS — M25552 Pain in left hip: Secondary | ICD-10-CM | POA: Diagnosis not present

## 2018-08-08 DIAGNOSIS — E119 Type 2 diabetes mellitus without complications: Secondary | ICD-10-CM | POA: Diagnosis not present

## 2018-08-08 DIAGNOSIS — M799 Soft tissue disorder, unspecified: Secondary | ICD-10-CM | POA: Insufficient documentation

## 2018-08-08 DIAGNOSIS — G894 Chronic pain syndrome: Secondary | ICD-10-CM

## 2018-08-08 DIAGNOSIS — M25551 Pain in right hip: Secondary | ICD-10-CM | POA: Insufficient documentation

## 2018-08-08 DIAGNOSIS — M7061 Trochanteric bursitis, right hip: Secondary | ICD-10-CM | POA: Diagnosis not present

## 2018-08-08 DIAGNOSIS — Z5181 Encounter for therapeutic drug level monitoring: Secondary | ICD-10-CM

## 2018-08-08 DIAGNOSIS — Z79891 Long term (current) use of opiate analgesic: Secondary | ICD-10-CM

## 2018-08-08 DIAGNOSIS — Z79899 Other long term (current) drug therapy: Secondary | ICD-10-CM | POA: Diagnosis not present

## 2018-08-08 DIAGNOSIS — G63 Polyneuropathy in diseases classified elsewhere: Secondary | ICD-10-CM

## 2018-08-08 DIAGNOSIS — M7062 Trochanteric bursitis, left hip: Secondary | ICD-10-CM

## 2018-08-08 DIAGNOSIS — M7989 Other specified soft tissue disorders: Secondary | ICD-10-CM | POA: Diagnosis not present

## 2018-08-08 MED ORDER — HYDROCODONE-ACETAMINOPHEN 7.5-325 MG PO TABS: 1.0000 | ORAL_TABLET | Freq: Four times a day (QID) | ORAL | 0 refills | Status: DC | PRN

## 2018-08-08 NOTE — Progress Notes (Signed)
Subjective:    Patient ID: [name] [name], male    DOB: Sep 05, 1956, 62 y.o.   MRN: [id number]  HPI: [name] is a 62 year old male who returns for follow up appointment for chronic pain and medication refill. He states his pain is located in his bilateral hips L>R. He rates his pain 4. His current exercise regime is walking.   [name] [name] is having a financial hardship and car problem will allow him to return in November, he verbalizes understanding.   [name] is 30.00 MME. Last UDS was Performed 05/11/2018, it was consistent.    Pain Inventory Average Pain 3 Pain Right Now 4 My pain is burning and aching  In the last 24 hours, has pain interfered with the following? General activity 4 Relation with others 3 Enjoyment of life 3 What TIME of day is your pain at its worst? night Sleep (in general) Poor  Pain is worse with: sitting and inactivity Pain improves with: rest, medication and injections Relief from Meds: 7  Mobility walk without assistance ability to climb steps?  yes do you drive?  yes Do you have any goals in this area?  yes  Function disabled: date disabled 2011 I need assistance with the following:  dressing  Neuro/Psych weakness numbness spasms  Prior Studies Any changes since last visit?  no  Physicians involved in your care Any changes since last visit?  no   Family History  Problem Relation Age of Onset  . Hypertension Mother   . Alzheimer's disease Mother   . Alcohol abuse Father   . Cancer Father 38       "Throat"  . Diabetes type II Brother   . Cancer Brother 68       throat cancer  . Heart disease Neg Hx    Social History   Socioeconomic History  . Marital status: Married    Spouse name: Not on file  . Number of children: Not on file  . Years of education: Not on file  . Highest education level: Not on file  Occupational History  . Occupation: unemployed    Comment: applying for disability  Social  Needs  . Financial resource strain: Not on file  . Food insecurity:    Worry: Not on file    Inability: Not on file  . Transportation needs:    Medical: Not on file    Non-medical: Not on file  Tobacco Use  . Smoking status: Former Smoker    Packs/day: 0.25    Years: 3.00    Pack years: 0.75    Last attempt to quit: 11/30/1998    Years since quitting: 19.7  . Smokeless tobacco: Never Used  Substance and Sexual Activity  . Alcohol use: No    Alcohol/week: 1.0 standard drinks    Types: 1 Standard drinks or [name] per week  . Drug use: No  . Sexual activity: Yes    Birth control/protection: None  Lifestyle  . Physical activity:    Days per week: Not on file    Minutes per session: Not on file  . Stress: Not on file  Relationships  . Social connections:    Talks on phone: Not on file    Gets together: Not on file    Attends religious service: Not on file    Active member of club or organization: Not on file    Attends meetings of clubs or organizations: Not on file  Relationship status: Not on file  Other Topics Concern  . Not on file  Social History Narrative  . Not on file   Past Surgical History:  Procedure Laterality Date  . LEFT HEART CATHETERIZATION WITH CORONARY ANGIOGRAM N/A 06/14/2014   Procedure: LEFT HEART CATHETERIZATION WITH CORONARY ANGIOGRAM;  Surgeon: Corky Crafts, MD;  Location[facility] CATH LAB;  Service: Cardiovascular;  Laterality: N/A;  . TONSILLECTOMY     Past Medical History:  Diagnosis Date  . Barrett's esophagus   . CAD (coronary artery disease)    a. NSTEMI 05/2014 - occluded RCA dominant proximal s/p asp-thrombectomy/DES to RCA, minimal LAD/LCx, EF 50% by cath, 65-70% by echo.  . Depression   . Diabetes type 2, uncontrolled (HCC)   . Former tobacco use   . GERD (gastroesophageal reflux disease)   . Hemorrhoids    hx of  . Hypercholesterolemia   . Hypertension   . MI (myocardial infarction) (HCC)   . Peripheral neuropathy     There were no vitals taken for this visit.  Opioid Risk Score:   Fall Risk Score:  `1  Depression screen PHQ 2/9  Depression screen [facility] 2/9 06/10/2018 04/12/2018 03/11/2018 10/01/2017 09/29/2017 03/08/2017 02/08/2017  Decreased Interest 0 0 0 0 0 0 0  Down, Depressed, Hopeless 0 0 0 0 0 0 0  PHQ - 2 Score 0 0 0 0 0 0 0  Altered sleeping - - - - - - -  Tired, decreased energy - - - - - - -  Change in appetite - - - - - - -  Feeling bad or failure about yourself  - - - - - - -  Trouble concentrating - - - - - - -  Moving slowly or fidgety/restless - - - - - - -  Suicidal thoughts - - - - - - -  PHQ-9 Score - - - - - - -  Difficult doing work/chores - - - - - - -    Review of Systems  Constitutional: Negative.   HENT: Negative.   Eyes: Negative.   Respiratory: Negative.   Cardiovascular: Negative.   Gastrointestinal: Negative.   Endocrine: Negative.   Genitourinary: Negative.   Musculoskeletal: Positive for arthralgias and myalgias.  Skin: Negative.   Allergic/Immunologic: Negative.   Neurological: Positive for weakness and numbness.  Hematological: Negative.   Psychiatric/Behavioral: Negative.   All other systems reviewed and are negative.      Objective:   Physical Exam  Constitutional: He is oriented to person, place, and time. He appears well-developed and well-nourished.  HENT:  Head: Normocephalic and atraumatic.  Neck: Normal range of motion. Neck supple.  Cardiovascular: Normal rate and regular rhythm.  Pulmonary/Chest: Effort normal and breath sounds normal.  Musculoskeletal:  Normal Muscle Bulk and Muscle Testing Reveals: Upper Extremities: Full ROM and Muscle Strength 5/5 Bilateral Greater Trochanter Tenderness Lower Extremities: Full ROM and Muscle Strength 5/5 Arises from Table with ease Narrow Based gait  Neurological: He is alert and oriented to person, place, and time.  Skin: Skin is warm and dry.  Nursing note and vitals reviewed.          Assessment & Plan:  1.Bilateral hip pain. 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 Ortho Following: Dr. Thurston Hole regarding right hip placement.08/08/2018 We will continue with current medication regime.Refilled: Hydrocodone 7.5/325 mg One tablet every 6 hours as needed #120. Second script e-scribe for the following month. We will continue the opioid monitoring program,  this consists of regular clinic visits, examinations, urine drug screen, pill counts as well as use of [place] Controlled Substance Reporting System.  2. Cervicalgia: Ortho Following: No complaints today. Continue with HEP as tolerated. Continue to Monitor. 08/08/2018 3. Type II DM: Polyneuropathy:Continuecurrent medication regime withGabapentin: 08/08/2018  4. Lumbar Radiculitis: Continuecurrent medication regimen withGabapentin. 08/08/2018  20 minutes of face to face patient care time was spent during this visit. All questions were encouraged and answered.  F/U in 1 month

## 2018-08-12 ENCOUNTER — Telehealth: Payer: Self-pay | Admitting: Family Medicine

## 2018-08-12 NOTE — Telephone Encounter (Signed)
Called pt. To reschedule pt. With Dr. [name] on 09/29/18. No VM available

## 2018-09-06 ENCOUNTER — Emergency Department (HOSPITAL_COMMUNITY)
Admission: EM | Admit: 2018-09-06 | Discharge: 2018-09-06 | Disposition: A | Discharge status: Home / Self Care | Payer: Medicare HMO | Attending: Emergency Medicine | Admitting: Emergency Medicine

## 2018-09-06 ENCOUNTER — Other Ambulatory Visit: Payer: Self-pay

## 2018-09-06 ENCOUNTER — Emergency Department (HOSPITAL_COMMUNITY): Payer: Medicare HMO

## 2018-09-06 ENCOUNTER — Encounter (HOSPITAL_COMMUNITY): Payer: Self-pay | Admitting: *Deleted

## 2018-09-06 DIAGNOSIS — I2510 Atherosclerotic heart disease of native coronary artery without angina pectoris: Secondary | ICD-10-CM | POA: Diagnosis not present

## 2018-09-06 DIAGNOSIS — R42 Dizziness and giddiness: Secondary | ICD-10-CM | POA: Diagnosis not present

## 2018-09-06 DIAGNOSIS — Z794 Long term (current) use of insulin: Secondary | ICD-10-CM | POA: Insufficient documentation

## 2018-09-06 DIAGNOSIS — M26602 Left temporomandibular joint disorder, unspecified: Secondary | ICD-10-CM | POA: Diagnosis not present

## 2018-09-06 DIAGNOSIS — Z79899 Other long term (current) drug therapy: Secondary | ICD-10-CM | POA: Insufficient documentation

## 2018-09-06 DIAGNOSIS — E1140 Type 2 diabetes mellitus with diabetic neuropathy, unspecified: Secondary | ICD-10-CM | POA: Insufficient documentation

## 2018-09-06 DIAGNOSIS — M26622 Arthralgia of left temporomandibular joint: Secondary | ICD-10-CM | POA: Diagnosis not present

## 2018-09-06 DIAGNOSIS — E1110 Type 2 diabetes mellitus with ketoacidosis without coma: Secondary | ICD-10-CM | POA: Diagnosis not present

## 2018-09-06 DIAGNOSIS — Z87891 Personal history of nicotine dependence: Secondary | ICD-10-CM | POA: Diagnosis not present

## 2018-09-06 DIAGNOSIS — H9202 Otalgia, left ear: Secondary | ICD-10-CM | POA: Diagnosis present

## 2018-09-06 LAB — CBC WITH DIFFERENTIAL/PLATELET
ABS IMMATURE GRANULOCYTES: 0.01 10*3/uL (ref 0.00–0.07)
Basophils Absolute: 0.1 10*3/uL (ref 0.0–0.1)
Basophils Relative: 1 %
Eosinophils Absolute: 0.3 10*3/uL (ref 0.0–0.5)
Eosinophils Relative: 6 %
HCT: 39.6 % (ref 39.0–52.0)
HEMOGLOBIN: 12.9 g/dL — AB (ref 13.0–17.0)
IMMATURE GRANULOCYTES: 0 %
LYMPHS PCT: 40 %
Lymphs Abs: 2.1 10*3/uL (ref 0.7–4.0)
MCH: 28.6 pg (ref 26.0–34.0)
MCHC: 32.6 g/dL (ref 30.0–36.0)
MCV: 87.8 fL (ref 80.0–100.0)
MONO ABS: 0.3 10*3/uL (ref 0.1–1.0)
Monocytes Relative: 7 %
NEUTROS ABS: 2.4 10*3/uL (ref 1.7–7.7)
Neutrophils Relative %: 46 %
Platelets: 314 10*3/uL (ref 150–400)
RBC: 4.51 MIL/uL (ref 4.22–5.81)
RDW: 13.3 % (ref 11.5–15.5)
WBC: 5.1 10*3/uL (ref 4.0–10.5)
nRBC: 0 % (ref 0.0–0.2)

## 2018-09-06 LAB — BASIC METABOLIC PANEL
Anion gap: 11 (ref 5–15)
BUN: 14 mg/dL (ref 6–20)
CHLORIDE: 105 mmol/L (ref 98–111)
CO2: 24 mmol/L (ref 22–32)
Calcium: 8.5 mg/dL — ABNORMAL LOW (ref 8.9–10.3)
Creatinine, Ser: 0.94 mg/dL (ref 0.61–1.24)
GFR calc Af Amer: >60 mL/min (ref >60)
GFR calc non Af Amer: >60 mL/min (ref >60)
GLUCOSE: 314 mg/dL — AB (ref 70–99)
POTASSIUM: 4.7 mmol/L (ref 3.5–5.1)
SODIUM: 140 mmol/L (ref 135–145)

## 2018-09-06 LAB — CBG MONITORING, ED: GLUCOSE-CAPILLARY: 273 mg/dL — AB (ref 70–99)

## 2018-09-06 MED ORDER — IBUPROFEN 400 MG PO TABS: 400.0000 mg | ORAL_TABLET | Freq: Four times a day (QID) | ORAL | 0 refills | Status: DC | PRN

## 2018-09-06 MED ORDER — METHOCARBAMOL 500 MG PO TABS: 500.0000 mg | ORAL_TABLET | Freq: Two times a day (BID) | ORAL | 0 refills | Status: DC

## 2018-09-06 NOTE — ED Provider Notes (Addendum)
[facility] DEPT Provider Note   CSN: [id number] Arrival date & time: 09/06/18  1402     History   Chief Complaint Chief Complaint  Patient presents with  . Otalgia  . Dizziness    HPI [name] [name] is a 61 y.o. male.  62 year old male who presents complaining of left-sided pain localized to his TMJ which goes to his ear.  States that when he opens and closes his mouth is when his symptoms become worse.  Denies any associated headache but has been dizzy at times.  Denies any ataxia.  No drainage from the ear or hearing loss.  No visual changes.  No focal weakness in his arms or legs.  Symptoms have been persistent for about a week or so.  Denies any injury to his mouth or jaw.  No treatment used prior to arrival.     Past Medical History:  Diagnosis Date  . Barrett's esophagus   . CAD (coronary artery disease)    a. NSTEMI 05/2014 - occluded RCA dominant proximal s/p asp-thrombectomy/DES to RCA, minimal LAD/LCx, EF 50% by cath, 65-70% by echo.  . Depression   . Diabetes type 2, uncontrolled ([place])   . Former tobacco use   . GERD (gastroesophageal reflux disease)   . Hemorrhoids    hx of  . Hypercholesterolemia   . Hypertension   . MI (myocardial infarction) ([place])   . Peripheral neuropathy     Patient Active Problem List   Diagnosis Date Noted  . Bilateral low back pain without sciatica 04/12/2018  . Vitamin D deficiency 04/07/2018  . Pill dysphagia 04/07/2018  . Enlarged thyroid gland 04/07/2018  . Labile blood glucose 04/07/2018  . Brittle diabetes mellitus ([place]) 04/07/2018  . AKI (acute kidney injury) ([place]) 09/22/2017  . Chronic pain syndrome 08/18/2016  . Diabetic ketoacidosis ([place]) 04/12/2016  . Hip pain, bilateral 08/23/2015  . Chronic neck pain 08/23/2015  . DKA (diabetic ketoacidoses) ([place]) 05/30/2015  . Acute renal failure ([place]) 05/30/2015  . Restless legs 08/31/2014  . Uncontrolled diabetes mellitus type 2 with peripheral  artery disease ([place]) 08/10/2014  . Old myocardial infarction 08/10/2014  . CAD (coronary artery disease) 06/18/2014  . Hyperlipidemia 06/18/2014  . NSTEMI (non-ST elevated myocardial infarction) ([place]) 06/14/2014  . N&V (nausea and vomiting) 07/21/2013  . Chronic pain in left shoulder 04/06/2013  . Peripheral neuropathy 02/28/2012  . Depression 12/25/2011  . GERD (gastroesophageal reflux disease) 12/25/2011  . DM (diabetes mellitus), type 2, uncontrolled ([place]) 11/18/2011    Past Surgical History:  Procedure Laterality Date  . LEFT HEART CATHETERIZATION WITH CORONARY ANGIOGRAM N/A 06/14/2014   Procedure: LEFT HEART CATHETERIZATION WITH CORONARY ANGIOGRAM;  Surgeon: Jettie Booze, MD;  Location[facility] CATH LAB;  Service: Cardiovascular;  Laterality: N/A;  . TONSILLECTOMY          Home Medications    Prior to Admission medications   Medication Sig Start Date End Date Taking? Authorizing Provider  EQ ASPIRIN ADULT LOW DOSE 81 MG EC tablet TAKE ONE TABLET BY MOUTH ONCE DAILY 07/02/15  Yes Jettie Booze, MD  gabapentin (NEURONTIN) 100 MG capsule Take Two Capsules in the Morning  and Bedtime 11/25/17  Yes Bayard Hugger, NP  HYDROcodone-acetaminophen (NORCO) 7.5-325 MG tablet Take 1 tablet by mouth every 6 (six) hours as needed for moderate pain. 08/08/18  Yes Bayard Hugger, NP  LEVEMIR FLEXTOUCH 100 UNIT/ML Pen Inject [place] 45u qam and 40u qhs. 03/11/18  Yes Shawnee Knapp,  MD  lisinopril (PRINIVIL,ZESTRIL) 2.5 MG tablet Take 1 tablet (2.5 mg total) by mouth daily. 03/11/18  Yes Shawnee Knapp, MD  metoprolol tartrate (LOPRESSOR) 25 MG tablet Take 0.5 tablets (12.5 mg total) by mouth 2 (two) times daily. 03/11/18  Yes Shawnee Knapp, MD  NOVOLOG FLEXPEN 100 UNIT/ML FlexPen INJECT 30 TO 35 UNITS UNDER THE SKIN THREE TIMES DAILY Patient taking differently: Inject 25-30 Units into the skin 3 (three) times daily with meals.  04/08/18  Yes Shawnee Knapp, MD  pantoprazole (PROTONIX) 40 MG tablet TAKE 1  TABLET BY MOUTH EVERY DAY 06/30/18  Yes Shawnee Knapp, MD  prasugrel (EFFIENT) 10 MG TABS tablet Take 1 tablet (10 mg total) by mouth daily. 03/11/18  Yes Shawnee Knapp, MD  rosuvastatin (CRESTOR) 40 MG tablet Take 1 tablet (40 mg total) by mouth daily. 03/11/18  Yes Shawnee Knapp, MD  blood glucose meter kit and supplies KIT Dispense based on patient and insurance preference. Use four times daily as directed. ICD10 E11.65, Z79.4 06/25/17   Shawnee Knapp, MD  insulin aspart (NOVOLOG FLEXPEN) 100 UNIT/ML FlexPen INJECT 30 TO 35 UNITS SUBCUTANEOUSLY THREE TIMES DAILY Patient not taking: Reported on 09/06/2018 03/11/18   Shawnee Knapp, MD  Insulin Pen Needle (B-D UF III MINI PEN NEEDLES) 31G X 5 MM MISC Use to inject insulin 5 pen needles per day 03/11/18   Shawnee Knapp, MD  nitroGLYCERIN (NITROSTAT) 0.4 MG SL tablet Place 1 tablet (0.4 mg total) under the tongue every 5 (five) minutes as needed for chest pain (up to 3 doses). 11/10/16   Jettie Booze, MD  [facility] VERIO test strip USE FOUR TIMES DAILY 07/15/18   Shawnee Knapp, MD  Skin Protectants, Misc. (ABSORBASE EX) Apply 1 application topically daily as needed (MUSCLE ACHES).     [provider]  Vitamin D, Ergocalciferol, (DRISDOL) 50000 units CAPS capsule Take 1 capsule (50,000 Units total) by mouth every 7 (seven) days. Patient not taking: Reported on 09/06/2018 04/07/18   Shawnee Knapp, MD    Family History Family History  Problem Relation Age of Onset  . Hypertension Mother   . Alzheimer's disease Mother   . Alcohol abuse Father   . Cancer Father 42       "Throat"  . Diabetes type II Brother   . Cancer Brother 68       throat cancer  . Heart disease Neg Hx     Social History Social History   Tobacco Use  . Smoking status: Former Smoker    Packs/day: 0.25    Years: 3.00    Pack years: 0.75    Last attempt to quit: 11/30/1998    Years since quitting: 19.7  . Smokeless tobacco: Never Used  Substance Use Topics  . Alcohol use: No     Alcohol/week: 1.0 standard drinks    Types: 1 Standard drinks or equivalent per week  . Drug use: No     Allergies   Patient has no known allergies.   Review of Systems Review of Systems  All other systems reviewed and are negative.    Physical Exam Updated Vital Signs BP (!) 169/85 (BP Location: Left Arm)   Pulse 82   Temp 98.1 F (36.7 C) (Oral)   Resp 16   Ht 1.753 m (_0 )   Wt 104.3 kg   SpO2 98%   BMI 33.97 kg/m   Physical Exam  Constitutional: He is oriented  to person, place, and time. He appears well-developed and well-nourished.  Non-toxic appearance. No distress.  HENT:  Head: Normocephalic and atraumatic.    Left Ear: Ear canal normal. No drainage or swelling. Tympanic membrane is not perforated and not bulging.  Patient is pinpoint tender at his left TMJ.  Symptoms worse with opening closing his mouth.  Eyes: Pupils are equal, round, and reactive to light. Conjunctivae, EOM and lids are normal.  Neck: Normal range of motion. Neck supple. No tracheal deviation present. No thyroid mass present.  Cardiovascular: Normal rate, regular rhythm and normal heart sounds. Exam reveals no gallop.  No murmur heard. Pulmonary/Chest: Effort normal and breath sounds normal. No stridor. No respiratory distress. He has no decreased breath sounds. He has no wheezes. He has no rhonchi. He has no rales.  Abdominal: Soft. Normal appearance and bowel sounds are normal. He exhibits no distension. There is no tenderness. There is no rebound and no CVA tenderness.  Musculoskeletal: Normal range of motion. He exhibits no edema or tenderness.  Neurological: He is alert and oriented to person, place, and time. He has normal strength. No cranial nerve deficit or sensory deficit. Coordination and gait normal. GCS eye subscore is 4. GCS verbal subscore is 5. GCS motor subscore is 6.  She is [facility] department without difficulty.  No nystagmus noted.  Skin: Skin is warm and dry. No abrasion  and no rash noted.  Psychiatric: He has a normal mood and affect. His speech is normal and behavior is normal.  Nursing note and vitals reviewed.    ED Treatments / Results  Labs (all labs ordered are listed, but only abnormal results are displayed) Labs Reviewed  BASIC METABOLIC PANEL - Abnormal; Notable for the following components:      Result Value   Glucose, Bld 314 (*)    Calcium 8.5 (*)    All other components within normal limits  CBC WITH DIFFERENTIAL/PLATELET - Abnormal; Notable for the following components:   Hemoglobin 12.9 (*)    All other components within normal limits  CBG MONITORING, ED - Abnormal; Notable for the following components:   Glucose-Capillary 273 (*)    All other components within normal limits    EKG EKG Interpretation  Date/Time:  Tuesday September 06 2018 14:35:59 EDT Ventricular Rate:  83 PR Interval:    QRS Duration: 75 QT Interval:  350 QTC Calculation: 412 R Axis:   18 Text Interpretation:  Sinus rhythm Confirmed by Lacretia Leigh ([id number]) on 09/06/2018 5:02:15 PM   Radiology Ct Head Wo Contrast  Result Date: 09/06/2018 CLINICAL DATA:  Subacute neuro deficit.  Dizziness EXAM: CT HEAD WITHOUT CONTRAST TECHNIQUE: Contiguous axial images were obtained from the base of the skull through the vertex without intravenous contrast. COMPARISON:  11/18/2011 FINDINGS: Brain: No evidence of acute infarction, hemorrhage, hydrocephalus, extra-axial collection or mass lesion/mass effect. Vascular: Negative for hyperdense vessel Skull: Negative Sinuses/Orbits: Negative Other: None IMPRESSION: Negative CT head Electronically Signed   By: Franchot Gallo M.D.   On: 09/06/2018 16:02    Procedures Procedures (including critical care time)  Medications Ordered in ED Medications - No data to display   Initial Impression / Assessment and Plan / ED Course  I have reviewed the triage vital signs and the nursing notes.  Pertinent labs & imaging results that  were available during my care of the patient were reviewed by me and considered in my medical decision making (see chart for details).     Patient with  several days of left-sided jaw pain localized to his left TMJ.  Work-up here including head CT and labs are without acute findings.  Left TM is without signs of infection.  Except for mild hyperglycemia.  Patient does not appear to be vertiginous at this time.  Low suspicion for central vertigo.  Patient was able to ambulate without assistance here.  He does not appear to be ataxic.  Will treat for TMJ pain  Final Clinical Impressions(s) / ED Diagnoses   Final diagnoses:  None    ED Discharge Orders    None       Lacretia Leigh, MD 09/06/18 1846    Lacretia Leigh, MD 09/06/18 [phone]

## 2018-09-06 NOTE — ED Triage Notes (Signed)
Left ear pain for 1 week, pain when he chews

## 2018-09-06 NOTE — ED Provider Notes (Signed)
MSE was initiated and I personally evaluated the patient and placed orders (if any) at  2:28 PM on September 06, 2018.  The patient appears stable so that the remainder of the MSE may be completed by another provider.  Patient placed in Quick Look pathway, seen and evaluated   Chief Complaint: otalgia, dizziness, lightheadedness, blurry vision  HPI:   This-year-old male with past medical history of diabetes, hypertension, prior MI who presents to ED for evaluation of 1 week history of dizziness, lightheadedness and left-sided otalgia.  States that he had a fall off of one step 1 week ago and injured his left palm.  He denies any head injury or loss of consciousness.  However, since then he feels dizzy anytime he stands up as if the room was spinning around him and having blurry vision.  Denies any fever, numbness in arms or legs, chest pain or shortness of breath.  ROS: Dizziness  Physical Exam:   Gen: No distress  Neuro: Awake and Alert  Skin: Warm    Focused Exam: PERRL.  Equal grip strength bilaterally.  No facial asymmetry noted.  Left TM bulging and slightly erythematous.   Initiation of care has begun. The patient has been counseled on the process, plan, and necessity for staying for the completion/evaluation, and the remainder of the medical screening examination    Dietrich Pates, PA-C 09/06/18 1431    Derwood Kaplan, MD 09/08/18 1732

## 2018-09-06 NOTE — ED Notes (Signed)
Pt stuck twice for blood draw with out success.

## 2018-09-06 NOTE — ED Notes (Addendum)
Pt adds that he has been dizzy since the weekend esp with movement and blurred/double vision.

## 2018-09-08 ENCOUNTER — Ambulatory Visit: Payer: Medicare HMO | Admitting: Family Medicine

## 2018-09-09 ENCOUNTER — Other Ambulatory Visit: Payer: Self-pay | Admitting: Registered Nurse

## 2018-09-09 ENCOUNTER — Other Ambulatory Visit: Payer: Self-pay | Admitting: Family Medicine

## 2018-09-09 NOTE — Telephone Encounter (Signed)
Requested Prescriptions  Pending Prescriptions Disp Refills  . prasugrel (EFFIENT) 10 MG TABS tablet [Pharmacy Med Name: PRASUGREL 10MG  TABLETS] 90 tablet 0    Sig: TAKE 1 TABLET BY MOUTH DAILY     Hematology:  Antiplatelets Failed - 09/09/2018  3:11 AM      Failed - HGB in normal range and within 180 days    Hemoglobin  Date Value Ref Range Status  09/06/2018 12.9 (L) 13.0 - 17.0 g/dL Final  01/60/1093 23.5 13.0 - 17.7 g/dL Final         Failed - Valid encounter within last 6 months    Recent Outpatient Visits          6 months ago Uncontrolled type 1 diabetes mellitus with hyperglycemia (HCC)   [facility], Levell July, MD   11 months ago Uncontrolled type 2 diabetes mellitus with hyperglycemia [facility])   [facility], Levell July, MD   1 year ago Uncontrolled type 2 diabetes mellitus with hyperglycemia, with long-term current use of insulin [facility])   [facility], Levell July, MD   2 years ago Insulin dependent type 2 diabetes mellitus, uncontrolled (HCC)   [facility], Levell July, MD   2 years ago Inj unsp musc/tend at lower leg level, left leg, init   [facility], Levell July, MD      Future Appointments            In 3 days Magdalene River, PA-C Primary Care at [place], [facility]   In 1 month Sherren Mocha, MD Primary Care at [place], [facility]           Passed - HCT in normal range and within 180 days    HCT  Date Value Ref Range Status  09/06/2018 39.6 39.0 - 52.0 % Final   Hematocrit  Date Value Ref Range Status  06/25/2017 41.1 37.5 - 51.0 % Final         Passed - PLT in normal range and within 180 days    Platelets  Date Value Ref Range Status  09/06/2018 314 150 - 400 K/uL Final  06/25/2017 323 150 - 379 x10E3/uL Final   Platelet Count, POC  Date Value Ref Range Status  03/11/2018 293 142 - 424 K/uL Final       . rosuvastatin (CRESTOR) 40 MG tablet [Pharmacy Med Name: ROSUVASTATIN 40MG  TABLETS] 90 tablet 0    Sig:  TAKE 1 TABLET(40 MG) BY MOUTH DAILY     Cardiovascular:  Antilipid - Statins Passed - 09/09/2018  3:11 AM      Passed - Total Cholesterol in normal range and within 360 days    Cholesterol, Total  Date Value Ref Range Status  09/29/2017 125 100 - 199 mg/dL Final         Passed - LDL in normal range and within 360 days    LDL Calculated  Date Value Ref Range Status  09/29/2017 61 0 - 99 mg/dL Final         Passed - HDL in normal range and within 360 days    HDL  Date Value Ref Range Status  09/29/2017 40 >39 mg/dL Final         Passed - Triglycerides in normal range and within 360 days    Triglycerides  Date Value Ref Range Status  09/29/2017 119 0 - 149 mg/dL Final  Passed - Patient is not pregnant      Passed - Valid encounter within last 12 months    Recent Outpatient Visits          6 months ago Uncontrolled type 1 diabetes mellitus with hyperglycemia [facility])   [facility], Levell July, MD   11 months ago Uncontrolled type 2 diabetes mellitus with hyperglycemia [facility])   [facility], Levell July, MD   1 year ago Uncontrolled type 2 diabetes mellitus with hyperglycemia, with long-term current use of insulin [facility])   [facility], Levell July, MD   2 years ago Insulin dependent type 2 diabetes mellitus, uncontrolled (HCC)   [facility], Levell July, MD   2 years ago Inj unsp musc/tend at lower leg level, left leg, init   [facility], Levell July, MD      Future Appointments            In 3 days Magdalene River, PA-C Primary Care at [place], [place]   In 1 month Sherren Mocha, MD Primary Care at [place], [facility]         . lisinopril (PRINIVIL,ZESTRIL) 2.5 MG tablet [Pharmacy Med Name: LISINOPRIL 2.5MG  TABLETS] 90 tablet 0    Sig: TAKE 1 TABLET(2.5 MG) BY MOUTH DAILY     Cardiovascular:  ACE Inhibitors Failed - 09/09/2018  3:11 AM      Failed - Last BP in normal range    BP Readings from Last 1 Encounters:  09/06/18  (!) 156/84         Failed - Valid encounter within last 6 months    Recent Outpatient Visits          6 months ago Uncontrolled type 1 diabetes mellitus with hyperglycemia [facility])   [facility], Levell July, MD   11 months ago Uncontrolled type 2 diabetes mellitus with hyperglycemia [facility])   [facility], Levell July, MD   1 year ago Uncontrolled type 2 diabetes mellitus with hyperglycemia, with long-term current use of insulin [facility])   [facility], Levell July, MD   2 years ago Insulin dependent type 2 diabetes mellitus, uncontrolled (HCC)   [facility], Levell July, MD   2 years ago Inj unsp musc/tend at lower leg level, left leg, init   [facility], Levell July, MD      Future Appointments            In 3 days Magdalene River, PA-C Primary Care at [place], [facility]   In 1 month Sherren Mocha, MD Primary Care at [place], [facility]           Passed - Cr in normal range and within 180 days    Creat  Date Value Ref Range Status  08/25/2016 1.10 0.70 - 1.33 mg/dL Final    Comment:      For patients > or = 60 years of age: The upper reference limit for Creatinine is approximately 13% higher for people identified as African-American.      Creatinine, Ser  Date Value Ref Range Status  09/06/2018 0.94 0.61 - 1.24 mg/dL Final         Passed - K in normal range and within 180 days    Potassium  Date Value Ref Range Status  09/06/2018 4.7 3.5 - 5.1 mmol/L Final  Passed - Patient is not pregnant

## 2018-09-10 ENCOUNTER — Other Ambulatory Visit: Payer: Self-pay | Admitting: Family Medicine

## 2018-09-12 ENCOUNTER — Encounter: Payer: Medicare HMO | Admitting: Family Medicine

## 2018-09-12 ENCOUNTER — Encounter: Payer: Self-pay | Admitting: Physician Assistant

## 2018-09-12 ENCOUNTER — Other Ambulatory Visit: Payer: Self-pay

## 2018-09-12 ENCOUNTER — Ambulatory Visit (INDEPENDENT_AMBULATORY_CARE_PROVIDER_SITE_OTHER): Payer: Medicare HMO | Admitting: Physician Assistant

## 2018-09-12 VITALS — BP 148/76 | HR 75 | Temp 98.2°F | Resp 18 | Ht 69.0 in | Wt 243.4 lb

## 2018-09-12 DIAGNOSIS — K047 Periapical abscess without sinus: Secondary | ICD-10-CM

## 2018-09-12 MED ORDER — AMOXICILLIN-POT CLAVULANATE 875-125 MG PO TABS: 1.0000 | ORAL_TABLET | Freq: Two times a day (BID) | ORAL | 0 refills | Status: DC

## 2018-09-12 NOTE — Progress Notes (Signed)
MRN: [id number] DOB: 05/16/1956  Subjective:   [name] [name] is a 62 y.o. male presenting for chief complaint of Jaw Pain (left side x1week ) . Patient here for jaw pain and seen in ED about a week ago.  Diagnosed with TMJ at that visit and started on Robaxin.  Since then -- pain persists with some question of improvement with Robaxin.  Worsened with chewing and opening mouth.  No alleviating factors.    Has had subjective warmth at home without overt chills.  No nausea or vomiting.  Feeling "off."  Has PMH with diabetes.  Hasn't tried anything else for relief b/c on Norco, which isn't really helping (chronic med).    Former smoker.  [name] has a current medication list which includes the following prescription(s): blood glucose meter kit and supplies, eq aspirin adult low dose, gabapentin, hydrocodone-acetaminophen, ibuprofen, insulin aspart, insulin pen needle, levemir flextouch, lisinopril, methocarbamol, metoprolol tartrate, nitroglycerin, novolog flexpen, onetouch verio, pantoprazole, prasugrel, rosuvastatin, skin protectants, misc., vitamin d (ergocalciferol), and amoxicillin-clavulanate. Also has No Known Allergies.  [name]  has a past medical history of Barrett's esophagus, CAD (coronary artery disease), Depression, Diabetes type 2, uncontrolled (HCC), Former tobacco use, GERD (gastroesophageal reflux disease), Hemorrhoids, Hypercholesterolemia, Hypertension, MI (myocardial infarction) (HCC), and Peripheral neuropathy. Also  has a past surgical history that includes Tonsillectomy and left heart catheterization with coronary angiogram (N/A, 06/14/2014).   Objective:   Vitals: BP (!) 148/76   Pulse 75   Temp 98.2 F (36.8 C) (Oral)   Resp 18   Ht 5' 9" (1.753 m)   Wt 243 lb 6.4 oz (110.4 kg)   SpO2 94%   BMI 35.94 kg/m   Physical Exam  Constitutional: He is oriented to person, place, and time. He appears well-developed and well-nourished.  HENT:  Head: Normocephalic and  atraumatic.    Right Ear: Tympanic membrane, external ear and ear canal normal. No mastoid tenderness.  Left Ear: Tympanic membrane, external ear and ear canal normal. No mastoid tenderness.  Nose: Right sinus exhibits no maxillary sinus tenderness and no frontal sinus tenderness. Left sinus exhibits no maxillary sinus tenderness and no frontal sinus tenderness.  Mouth/Throat: Uvula is midline, oropharynx is clear and moist and mucous membranes are normal. Abnormal dentition. Dental caries present. No dental abscesses. No tonsillar abscesses.    Eyes: Conjunctivae are normal.  Neck: Normal range of motion.  Pulmonary/Chest: Effort normal.  Neurological: He is alert and oriented to person, place, and time.  Skin: Skin is warm and dry.  Psychiatric: He has a normal mood and affect.  Vitals reviewed.   No results found for this or any previous visit (from the past 24 hour(s)).  Assessment and Plan :  1. Dental abscess - No current abscess, but concern for infection.  Perhaps starting as such tenderness and with warmth overlying cheek.  - no drooling, no breathing difficulties.  Hasn't had anything to eat today but tolerating po fluids well - Rx for Augmentin to treat - advised to contact dentist asap   , PA-C  Primary [facility] at [facility] 09/12/2018 9:04 PM  

## 2018-09-12 NOTE — Patient Instructions (Addendum)
  Your physical exam is concerning that this could be a dental infection. Start antibiotics as prescribed. Contact a dentist to schedule an appointment as soon as you can. Return to clinic if symptoms worsen, do not improve, or as needed.    If you have lab work done today you will be contacted with your lab results within the next 2 weeks.  If you have not heard from [place] then please contact us. The fastest way to get your results is to register for My Chart.   Dental Abscess A dental abscess is pus in or around a tooth. Follow these instructions at home:  Take medicines only as told by your dentist.  If you were prescribed antibiotic medicine, finish all of it even if you start to feel better.  Rinse your mouth (gargle) often with salt water.  Do not drive or use heavy machinery, like a lawn mower, while taking pain medicine.  Do not apply heat to the outside of your mouth.  Keep all follow-up visits as told by your dentist. This is important. Contact a doctor if:  Your pain is worse, and medicine does not help. Get help right away if:  You have a fever or chills.  Your symptoms suddenly get worse.  You have a very bad headache.  You have problems breathing or swallowing.  You have trouble opening your mouth.  You have puffiness (swelling) in your neck or around your eye. This information is not intended to replace advice given to you by your health care provider. Make sure you discuss any questions you have with your health care provider. Document Released: 04/02/2015 Document Revised: 04/23/2016 Document Reviewed: 11/13/2014 Elsevier Interactive Patient Education  2018 [employer].  IF you received an x-ray today, you will receive an invoice from [facility]. Please contact [facility] at [phone] with questions or concerns regarding your invoice.   IF you received labwork today, you will receive an invoice from [facility]. Please contact [facility] at  [phone] with questions or concerns regarding your invoice.   Our billing staff will not be able to assist you with questions regarding bills from these companies.  You will be contacted with the lab results as soon as they are available. The fastest way to get your results is to activate your My Chart account. Instructions are located on the last page of this paperwork. If you have not heard from [place] regarding the results in 2 weeks, please contact this office.

## 2018-09-12 NOTE — Telephone Encounter (Signed)
3 refills given with this refill. Requested Prescriptions  Pending Prescriptions Disp Refills  . metoprolol tartrate (LOPRESSOR) 25 MG tablet [Pharmacy Med Name: METOPROLOL TARTRATE 25MG  TABLETS] 90 tablet 3    Sig: TAKE 1/2 TABLET(12.5 MG) BY MOUTH TWICE DAILY     Cardiovascular:  Beta Blockers Failed - 09/10/2018  3:11 AM      Failed - Last BP in normal range    BP Readings from Last 1 Encounters:  09/06/18 (!) 156/84         Failed - Valid encounter within last 6 months    Recent Outpatient Visits          6 months ago Uncontrolled type 1 diabetes mellitus with hyperglycemia [facility])   [facility], Levell July, MD   11 months ago Uncontrolled type 2 diabetes mellitus with hyperglycemia [facility])   [facility], Levell July, MD   1 year ago Uncontrolled type 2 diabetes mellitus with hyperglycemia, with long-term current use of insulin [facility])   [facility] at [facility], Levell July, MD   2 years ago Insulin dependent type 2 diabetes mellitus, uncontrolled (HCC)   [facility], Levell July, MD   2 years ago Inj unsp musc/tend at lower leg level, left leg, init   [facility], Levell July, MD      Future Appointments            Today Magdalene River, PA-C Primary Care at [place], [place]   In 1 month Sherren Mocha, MD Primary Care at [place], [facility]           Passed - Last Heart Rate in normal range    Pulse Readings from Last 1 Encounters:  09/06/18 86

## 2018-09-26 ENCOUNTER — Other Ambulatory Visit: Payer: Self-pay | Admitting: Family Medicine

## 2018-09-26 NOTE — Telephone Encounter (Signed)
Requested Prescriptions  Pending Prescriptions Disp Refills  . pantoprazole (PROTONIX) 40 MG tablet [Pharmacy Med Name: PANTOPRAZOLE 40MG  TABLETS] 90 tablet 0    Sig: TAKE 1 TABLET BY MOUTH EVERY DAY     Gastroenterology: Proton Pump Inhibitors Passed - 09/26/2018  3:11 AM      Passed - Valid encounter within last 12 months    Recent Outpatient Visits          2 weeks ago Dental abscess   Primary Care at [place], [place] D, PA-C   6 months ago Uncontrolled type 1 diabetes mellitus with hyperglycemia [facility])   [facility], Levell July, MD   12 months ago Uncontrolled type 2 diabetes mellitus with hyperglycemia [facility])   [facility], Levell July, MD   1 year ago Uncontrolled type 2 diabetes mellitus with hyperglycemia, with long-term current use of insulin [facility])   [facility] at [facility], Levell July, MD   2 years ago Insulin dependent type 2 diabetes mellitus, uncontrolled (HCC)   [facility], Levell July, MD      Future Appointments            In 1 month [name] Levell July, MD Primary Care at [place], [facility]

## 2018-09-28 ENCOUNTER — Other Ambulatory Visit: Payer: Self-pay | Admitting: Family Medicine

## 2018-09-28 NOTE — Telephone Encounter (Signed)
2 month courtesy supply ordered for December appointment. Requested Prescriptions  Pending Prescriptions Disp Refills  . LEVEMIR FLEXTOUCH 100 UNIT/ML Pen [Pharmacy Med Name: LEVEMIR FLEX TOUCH PEN INJ 3ML] 90 mL 1    Sig: INJECT 45 UNITS UNDER THE SKIN EVERY MORNING AND 40 UNITS EVERY NIGHT AT BEDTIME     Endocrinology:  Diabetes - Insulins Failed - 09/28/2018 10:08 AM      Failed - HBA1C is between 0 and 7.9 and within 180 days    Hemoglobin A1C  Date Value Ref Range Status  03/11/2018 9.0  Final   Hgb A1c MFr Bld  Date Value Ref Range Status  09/19/2015 8.4 (A) 4.0 - 6.0 % Final         Passed - Valid encounter within last 6 months    Recent Outpatient Visits          2 weeks ago Dental abscess   Primary Care at [place], [place] D, PA-C   6 months ago Uncontrolled type 1 diabetes mellitus with hyperglycemia [facility])   [facility], Levell July, MD   12 months ago Uncontrolled type 2 diabetes mellitus with hyperglycemia [facility])   [facility], Levell July, MD   1 year ago Uncontrolled type 2 diabetes mellitus with hyperglycemia, with long-term current use of insulin [facility])   [facility], Levell July, MD   2 years ago Insulin dependent type 2 diabetes mellitus, uncontrolled (HCC)   [facility], Levell July, MD      Future Appointments            In 1 month [name] Levell July, MD Primary Care at [place], [facility]

## 2018-09-29 ENCOUNTER — Encounter: Payer: Medicare HMO | Admitting: Family Medicine

## 2018-10-03 ENCOUNTER — Encounter: Payer: Medicare HMO | Attending: Physical Medicine & Rehabilitation | Admitting: Registered Nurse

## 2018-10-03 ENCOUNTER — Encounter: Payer: Self-pay | Admitting: Registered Nurse

## 2018-10-03 VITALS — BP 124/78 | HR 73 | Resp 14 | Ht 69.0 in | Wt 240.0 lb

## 2018-10-03 DIAGNOSIS — M7989 Other specified soft tissue disorders: Secondary | ICD-10-CM | POA: Diagnosis not present

## 2018-10-03 DIAGNOSIS — E119 Type 2 diabetes mellitus without complications: Secondary | ICD-10-CM | POA: Diagnosis not present

## 2018-10-03 DIAGNOSIS — G894 Chronic pain syndrome: Secondary | ICD-10-CM

## 2018-10-03 DIAGNOSIS — M25559 Pain in unspecified hip: Secondary | ICD-10-CM

## 2018-10-03 DIAGNOSIS — M25552 Pain in left hip: Secondary | ICD-10-CM | POA: Diagnosis not present

## 2018-10-03 DIAGNOSIS — Z5181 Encounter for therapeutic drug level monitoring: Secondary | ICD-10-CM | POA: Diagnosis not present

## 2018-10-03 DIAGNOSIS — Z79891 Long term (current) use of opiate analgesic: Secondary | ICD-10-CM

## 2018-10-03 DIAGNOSIS — G63 Polyneuropathy in diseases classified elsewhere: Secondary | ICD-10-CM | POA: Diagnosis not present

## 2018-10-03 DIAGNOSIS — M545 Low back pain, unspecified: Secondary | ICD-10-CM

## 2018-10-03 DIAGNOSIS — Z79899 Other long term (current) drug therapy: Secondary | ICD-10-CM | POA: Insufficient documentation

## 2018-10-03 DIAGNOSIS — M25551 Pain in right hip: Secondary | ICD-10-CM | POA: Insufficient documentation

## 2018-10-03 DIAGNOSIS — Z96643 Presence of artificial hip joint, bilateral: Secondary | ICD-10-CM | POA: Diagnosis not present

## 2018-10-03 DIAGNOSIS — M799 Soft tissue disorder, unspecified: Secondary | ICD-10-CM | POA: Insufficient documentation

## 2018-10-03 DIAGNOSIS — G8928 Other chronic postprocedural pain: Secondary | ICD-10-CM | POA: Diagnosis not present

## 2018-10-03 MED ORDER — HYDROCODONE-ACETAMINOPHEN 7.5-325 MG PO TABS: 1.0000 | ORAL_TABLET | Freq: Four times a day (QID) | ORAL | 0 refills | Status: DC | PRN

## 2018-10-03 NOTE — Progress Notes (Signed)
Subjective:    Patient ID: [name] [name], male    DOB: 1956/10/18, 62 y.o.   MRN: [id number]  HPI: [name] is a 62 year old male who returns for follow up appointment for chronic pain and medication refill. He states his pain is located in his lower back, bilateral hips L>R and bilateral feet with tingling and burning. He rates his pain 3. His current exercise regime is walking.   [name] [name] still having financial hardship and car problems, he will bel allowed to return in two months, he verbalizes understanding.   [name] [name] Morphine Equivalent is 30.00 MME. Last UDS was Performed on 05/11/2018, it was consistent.    Pain Inventory Average Pain 4 Pain Right Now 3 My pain is dull and aching  In the last 24 hours, has pain interfered with the following? General activity 4 Relation with others 3 Enjoyment of life 3 What TIME of day is your pain at its worst? night Sleep (in general) Poor  Pain is worse with: sitting and inactivity Pain improves with: rest, medication and injections Relief from Meds: 8  Mobility walk without assistance how many minutes can you walk? 10 ability to climb steps?  yes do you drive?  yes Do you have any goals in this area?  no  Function disabled: date disabled 04/11 Do you have any goals in this area?  no  Neuro/Psych weakness numbness tingling  Prior Studies Any changes since last visit?  no  Physicians involved in your care Any changes since last visit?  no   Family History  Problem Relation Age of Onset  . Hypertension Mother   . Alzheimer's disease Mother   . Alcohol abuse Father   . Cancer Father 54       "Throat"  . Diabetes type II Brother   . Cancer Brother 68       throat cancer  . Heart disease Neg Hx    Social History   Socioeconomic History  . Marital status: Married    Spouse name: Not on file  . Number of children: Not on file  . Years of education: Not on file  . Highest education level: Not on  file  Occupational History  . Occupation: unemployed    Comment: applying for disability  Social Needs  . Financial resource strain: Not on file  . Food insecurity:    Worry: Not on file    Inability: Not on file  . Transportation needs:    Medical: Not on file    Non-medical: Not on file  Tobacco Use  . Smoking status: Former Smoker    Packs/day: 0.25    Years: 3.00    Pack years: 0.75    Last attempt to quit: 11/30/1998    Years since quitting: 19.8  . Smokeless tobacco: Never Used  Substance and Sexual Activity  . Alcohol use: No    Alcohol/week: 1.0 standard drinks    Types: 1 Standard drinks or equivalent per week  . Drug use: No  . Sexual activity: Yes    Birth control/protection: None  Lifestyle  . Physical activity:    Days per week: Not on file    Minutes per session: Not on file  . Stress: Not on file  Relationships  . Social connections:    Talks on phone: Not on file    Gets together: Not on file    Attends religious service: Not on file    Active member  of club or organization: Not on file    Attends meetings of clubs or organizations: Not on file    Relationship status: Not on file  Other Topics Concern  . Not on file  Social History Narrative  . Not on file   Past Surgical History:  Procedure Laterality Date  . LEFT HEART CATHETERIZATION WITH CORONARY ANGIOGRAM N/A 06/14/2014   Procedure: LEFT HEART CATHETERIZATION WITH CORONARY ANGIOGRAM;  Surgeon: Corky Crafts, MD;  Location: [facility] CATH LAB;  Service: Cardiovascular;  Laterality: N/A;  . TONSILLECTOMY     Past Medical History:  Diagnosis Date  . Barrett's esophagus   . CAD (coronary artery disease)    a. NSTEMI 05/2014 - occluded RCA dominant proximal s/p asp-thrombectomy/DES to RCA, minimal LAD/LCx, EF 50% by cath, 65-70% by echo.  . Depression   . Diabetes type 2, uncontrolled (HCC)   . Former tobacco use   . GERD (gastroesophageal reflux disease)   . Hemorrhoids    hx of  .  Hypercholesterolemia   . Hypertension   . MI (myocardial infarction) (HCC)   . Peripheral neuropathy    BP 124/78 (BP Location: Right Arm, Patient Position: Sitting, Cuff Size: Normal)   Pulse 73   Resp 14   Ht 5\' 9"  (1.753 m)   Wt 240 lb (108.9 kg)   SpO2 96%   BMI 35.44 kg/m   Opioid Risk Score:   Fall Risk Score:  `1  Depression screen PHQ 2/9  Depression screen [facility] 2/9 09/12/2018 06/10/2018 04/12/2018 03/11/2018 10/01/2017 09/29/2017 03/08/2017  Decreased Interest 0 0 0 0 0 0 0  Down, Depressed, Hopeless 0 0 0 0 0 0 0  PHQ - 2 Score 0 0 0 0 0 0 0  Altered sleeping - - - - - - -  Tired, decreased energy - - - - - - -  Change in appetite - - - - - - -  Feeling bad or failure about yourself  - - - - - - -  Trouble concentrating - - - - - - -  Moving slowly or fidgety/restless - - - - - - -  Suicidal thoughts - - - - - - -  PHQ-9 Score - - - - - - -  Difficult doing work/chores - - - - - - -    Review of Systems  Constitutional: Negative.   HENT: Negative.   Eyes: Negative.   Respiratory: Negative.   Cardiovascular: Negative.   Gastrointestinal: Negative.  Negative for abdominal distention.  Endocrine: Negative.   Genitourinary: Negative.   Musculoskeletal: Positive for arthralgias and back pain.  Skin: Negative.   Allergic/Immunologic: Negative.   Neurological: Positive for weakness and numbness.       Tingling  Hematological: Negative.   Psychiatric/Behavioral: Negative.   All other systems reviewed and are negative.      Objective:   Physical Exam  Constitutional: He is oriented to person, place, and time. He appears well-developed and well-nourished.  HENT:  Head: Normocephalic and atraumatic.  Neck: Normal range of motion. Neck supple.  Cardiovascular: Normal rate and regular rhythm.  Pulmonary/Chest: Effort normal and breath sounds normal.  Musculoskeletal:  Normal Muscle Bulk and Muscle Testing Reveals: Upper Extremities: Full ROM and Muscle Strength  5/5 Lumbar Paraspinal Tenderness: L-4-L-5 Bilateral Greater Trochanter Tenderness Lower Extremities: Full ROM and Muscle Strength 5/5 Left Lower Extremity Flexion Produces Pain into Left hip and left lower extremity Arises from Table slowly Narrow Based Gait  Neurological: He  is alert and oriented to person, place, and time.  Skin: Skin is warm and dry.  Psychiatric: He has a normal mood and affect. His behavior is normal.  Nursing note and vitals reviewed.         Assessment & Plan:  1.Bilateral hip pain. 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 Ortho Following: Dr. Thurston Hole regarding right hip placement.10/03/2018 We will continue with current medication regime.Refilled: Hydrocodone 7.5/325 mg One tablet every 6 hours as needed #120. Second script e-scribe for the following month. We will continue the opioid monitoring program, this consists of regular clinic visits, examinations, urine drug screen, pill counts as well as use of [place] Controlled Substance Reporting System.  2. Cervicalgia: Ortho Following: No complaints today. Continue with HEP as tolerated. Continue to Monitor. 10/03/2018 3. Type II DM: Polyneuropathy:Continuecurrent medication regime withGabapentin: 10/03/2018  4. Lumbar Radiculitis: Continuecurrent medication regimen withGabapentin. 10/03/2018  20 minutes of face to face patient care time was spent during this visit. All questions were encouraged and answered.  F/U in 1 month

## 2018-10-28 ENCOUNTER — Other Ambulatory Visit: Payer: Self-pay | Admitting: Family Medicine

## 2018-11-03 ENCOUNTER — Encounter: Payer: Medicare HMO | Admitting: Family Medicine

## 2018-11-12 ENCOUNTER — Other Ambulatory Visit: Payer: Self-pay | Admitting: Emergency Medicine

## 2018-11-12 ENCOUNTER — Encounter (HOSPITAL_COMMUNITY): Payer: Self-pay | Admitting: Emergency Medicine

## 2018-11-12 ENCOUNTER — Emergency Department (HOSPITAL_COMMUNITY)
Admission: EM | Admit: 2018-11-12 | Discharge: 2018-11-12 | Disposition: A | Discharge status: Home / Self Care | Payer: Medicare HMO | Attending: Emergency Medicine | Admitting: Emergency Medicine

## 2018-11-12 DIAGNOSIS — I2510 Atherosclerotic heart disease of native coronary artery without angina pectoris: Secondary | ICD-10-CM | POA: Insufficient documentation

## 2018-11-12 DIAGNOSIS — E1165 Type 2 diabetes mellitus with hyperglycemia: Secondary | ICD-10-CM | POA: Diagnosis not present

## 2018-11-12 DIAGNOSIS — R600 Localized edema: Secondary | ICD-10-CM | POA: Diagnosis not present

## 2018-11-12 DIAGNOSIS — Z87891 Personal history of nicotine dependence: Secondary | ICD-10-CM | POA: Insufficient documentation

## 2018-11-12 DIAGNOSIS — Z7982 Long term (current) use of aspirin: Secondary | ICD-10-CM | POA: Diagnosis not present

## 2018-11-12 DIAGNOSIS — Z955 Presence of coronary angioplasty implant and graft: Secondary | ICD-10-CM | POA: Insufficient documentation

## 2018-11-12 DIAGNOSIS — E1140 Type 2 diabetes mellitus with diabetic neuropathy, unspecified: Secondary | ICD-10-CM | POA: Diagnosis not present

## 2018-11-12 DIAGNOSIS — R609 Edema, unspecified: Secondary | ICD-10-CM

## 2018-11-12 DIAGNOSIS — I10 Essential (primary) hypertension: Secondary | ICD-10-CM | POA: Insufficient documentation

## 2018-11-12 DIAGNOSIS — Z79899 Other long term (current) drug therapy: Secondary | ICD-10-CM | POA: Insufficient documentation

## 2018-11-12 DIAGNOSIS — Z794 Long term (current) use of insulin: Secondary | ICD-10-CM | POA: Diagnosis not present

## 2018-11-12 DIAGNOSIS — R739 Hyperglycemia, unspecified: Secondary | ICD-10-CM

## 2018-11-12 LAB — BASIC METABOLIC PANEL
ANION GAP: 13 (ref 5–15)
BUN: 9 mg/dL (ref 6–20)
CO2: 21 mmol/L — ABNORMAL LOW (ref 22–32)
Calcium: 8.7 mg/dL — ABNORMAL LOW (ref 8.9–10.3)
Chloride: 101 mmol/L (ref 98–111)
Creatinine, Ser: 1.09 mg/dL (ref 0.61–1.24)
GFR calc Af Amer: >60 mL/min (ref >60)
GLUCOSE: 496 mg/dL — AB (ref 70–99)
POTASSIUM: 4.6 mmol/L (ref 3.5–5.1)
Sodium: 135 mmol/L (ref 135–145)

## 2018-11-12 LAB — CBC
HCT: 43.5 % (ref 39.0–52.0)
HEMOGLOBIN: 13.2 g/dL (ref 13.0–17.0)
MCH: 27.6 pg (ref 26.0–34.0)
MCHC: 30.3 g/dL (ref 30.0–36.0)
MCV: 91 fL (ref 80.0–100.0)
NRBC: 0 % (ref 0.0–0.2)
Platelets: 316 10*3/uL (ref 150–400)
RBC: 4.78 MIL/uL (ref 4.22–5.81)
RDW: 13.3 % (ref 11.5–15.5)
WBC: 4.3 10*3/uL (ref 4.0–10.5)

## 2018-11-12 LAB — CBG MONITORING, ED
GLUCOSE-CAPILLARY: 393 mg/dL — AB (ref 70–99)
Glucose-Capillary: 508 mg/dL (ref 70–99)
Glucose-Capillary: 397 mg/dL — ABNORMAL HIGH (ref 70–99)

## 2018-11-12 LAB — URINALYSIS, ROUTINE W REFLEX MICROSCOPIC
BILIRUBIN URINE: NEGATIVE
Bacteria, UA: NONE SEEN
HGB URINE DIPSTICK: NEGATIVE
KETONES UR: NEGATIVE mg/dL
LEUKOCYTES UA: NEGATIVE
NITRITE: NEGATIVE
PROTEIN: NEGATIVE mg/dL
Specific Gravity, Urine: 1.012 (ref 1.005–1.030)
pH: 5 (ref 5.0–8.0)

## 2018-11-12 LAB — HEPATIC FUNCTION PANEL
ALT: 21 U/L (ref 0–44)
AST: 32 U/L (ref 15–41)
Albumin: 3.7 g/dL (ref 3.5–5.0)
Alkaline Phosphatase: 107 U/L (ref 38–126)
BILIRUBIN TOTAL: 0.4 mg/dL (ref 0.3–1.2)
Total Protein: 6.1 g/dL — ABNORMAL LOW (ref 6.5–8.1)

## 2018-11-12 LAB — I-STAT VENOUS BLOOD GAS, ED
ACID-BASE DEFICIT: 4 mmol/L — AB (ref 0.0–2.0)
Bicarbonate: 23 mmol/L (ref 20.0–28.0)
O2 SAT: 65 %
TCO2: 25 mmol/L (ref 22–32)
pCO2, Ven: 49.5 mmHg (ref 44.0–60.0)
pH, Ven: 7.276 (ref 7.250–7.430)
pO2, Ven: 38 mmHg (ref 32.0–45.0)

## 2018-11-12 LAB — BRAIN NATRIURETIC PEPTIDE: B NATRIURETIC PEPTIDE 5: 15 pg/mL (ref 0.0–100.0)

## 2018-11-12 MED ORDER — INSULIN ASPART 100 UNIT/ML ~~LOC~~ SOLN
10.0000 [IU] | Freq: Once | SUBCUTANEOUS | Status: AC
  Administered 2018-11-12: 10 [IU] via INTRAVENOUS

## 2018-11-12 MED ORDER — FUROSEMIDE 20 MG PO TABS: 20.0000 mg | ORAL_TABLET | Freq: Every day | ORAL | 0 refills | Status: DC

## 2018-11-12 MED ORDER — SODIUM CHLORIDE 0.9 % IV BOLUS
1000.0000 mL | Freq: Once | INTRAVENOUS | Status: AC
  Administered 2018-11-12: 1000 mL via INTRAVENOUS

## 2018-11-12 NOTE — ED Notes (Signed)
Pt ambulated in hall and to bathroom, complained of tingling in feet and slight dizziness.

## 2018-11-12 NOTE — Discharge Instructions (Signed)
Your testing today shows that your blood sugar is elevated but you are not in Acidosis thankfully You likely need to see an endocrinologist if you are not already seeing one.  Talk to your family doctor about referral. You should use the lasix daily for 7 days to help with the swelling in your legs ER for worsening symptoms.

## 2018-11-12 NOTE — ED Notes (Signed)
Lab called to confirm that BNP and hepatic function was added on, running labs now.

## 2018-11-12 NOTE — ED Provider Notes (Signed)
[facility] EMERGENCY DEPARTMENT Provider Note   CSN: [id number] Arrival date & time: 11/12/18  0451     History   Chief Complaint Chief Complaint  Patient presents with  . Hyperglycemia    HPI [name] [name] is a 62 y.o. male.  HPI  The patient is a 62 year old male, he is an insulin requiring diabetic who also has a history of hypertension, hypercholesterolemia and myocardial infarction status post coronary stenting several years ago.  He reports today that he has had some progressive lower extremity swelling especially of his ankles below his knees, this has been symmetrical, progressive and worsened over the last week making it difficult for him to walk because of pins-and-needles and painful sensations in his bilateral legs.  He has been diagnosed with peripheral neuropathy because of his diabetes and is currently taking gabapentin.  He reports that he has had some increasing hyperglycemia at home and has had some increased thirst, increased urinary frequency but states his urine is starting to get darker yellow, he had diarrhea a week ago but this resolved, he denies fevers chills abdominal pain and is not vomiting though he has mild intermittent nausea.  No chest pain or shortness of breath, no headaches or blurred vision.  He reports he has been compliant with his medications including his insulin and his diet.  This morning he felt more fatigued and with the increased pain in his feet decided to come to the hospital for evaluation.  Past Medical History:  Diagnosis Date  . Barrett's esophagus   . CAD (coronary artery disease)    a. NSTEMI 05/2014 - occluded RCA dominant proximal s/p asp-thrombectomy/DES to RCA, minimal LAD/LCx, EF 50% by cath, 65-70% by echo.  . Depression   . Diabetes type 2, uncontrolled (HCC)   . Former tobacco use   . GERD (gastroesophageal reflux disease)   . Hemorrhoids    hx of  . Hypercholesterolemia   . Hypertension   . MI  (myocardial infarction) (HCC)   . Peripheral neuropathy     Patient Active Problem List   Diagnosis Date Noted  . Bilateral low back pain without sciatica 04/12/2018  . Vitamin D deficiency 04/07/2018  . Pill dysphagia 04/07/2018  . Enlarged thyroid gland 04/07/2018  . Labile blood glucose 04/07/2018  . Brittle diabetes mellitus (HCC) 04/07/2018  . AKI (acute kidney injury) (HCC) 09/22/2017  . Chronic pain syndrome 08/18/2016  . Diabetic ketoacidosis (HCC) 04/12/2016  . Hip pain, bilateral 08/23/2015  . Chronic neck pain 08/23/2015  . DKA (diabetic ketoacidoses) (HCC) 05/30/2015  . Acute renal failure (HCC) 05/30/2015  . Restless legs 08/31/2014  . Uncontrolled diabetes mellitus type 2 with peripheral artery disease (HCC) 08/10/2014  . Old myocardial infarction 08/10/2014  . CAD (coronary artery disease) 06/18/2014  . Hyperlipidemia 06/18/2014  . NSTEMI (non-ST elevated myocardial infarction) (HCC) 06/14/2014  . N&V (nausea and vomiting) 07/21/2013  . Chronic pain in left shoulder 04/06/2013  . Peripheral neuropathy 02/28/2012  . Depression 12/25/2011  . GERD (gastroesophageal reflux disease) 12/25/2011  . DM (diabetes mellitus), type 2, uncontrolled (HCC) 11/18/2011    Past Surgical History:  Procedure Laterality Date  . LEFT HEART CATHETERIZATION WITH CORONARY ANGIOGRAM N/A 06/14/2014   Procedure: LEFT HEART CATHETERIZATION WITH CORONARY ANGIOGRAM;  Surgeon: Corky Crafts, MD;  Location: [facility] CATH LAB;  Service: Cardiovascular;  Laterality: N/A;  . TONSILLECTOMY          Home Medications    Prior to Admission medications  Medication Sig Start Date End Date Taking? Authorizing Provider  EQ ASPIRIN ADULT LOW DOSE 81 MG EC tablet TAKE ONE TABLET BY MOUTH ONCE DAILY 07/02/15  Yes Corky Crafts, MD  gabapentin (NEURONTIN) 100 MG capsule TAKE 2 CAPSULES BY MOUTH EVERY MORNING AND AT BEDTIME 09/09/18  Yes Jones Bales, NP  HYDROcodone-acetaminophen (NORCO)  7.5-325 MG tablet Take 1 tablet by mouth every 6 (six) hours as needed for moderate pain. 10/03/18  Yes Jones Bales, NP  ibuprofen (ADVIL,MOTRIN) 400 MG tablet Take 1 tablet (400 mg total) by mouth every 6 (six) hours as needed. 09/06/18  Yes Lorre Nick, MD  insulin aspart (NOVOLOG FLEXPEN) 100 UNIT/ML FlexPen INJECT 30 TO 35 UNITS SUBCUTANEOUSLY THREE TIMES DAILY Patient taking differently: Inject 15-30 Units into the skin See admin instructions. INJECT 25 TO 30 UNITS SUBCUTANEOUSLY THREE TIMES DAILY PER SLIDING SCALE 03/11/18  Yes Sherren Mocha, MD  LEVEMIR FLEXTOUCH 100 UNIT/ML Pen INJECT 45 UNITS UNDER THE SKIN EVERY MORNING AND 40 UNITS EVERY NIGHT AT BEDTIME Patient taking differently: Inject 40-45 Units into the skin See admin instructions. INJECT 45 UNITS UNDER THE SKIN EVERY MORNING AND 40 UNITS EVERY NIGHT AT BEDTIME 09/28/18  Yes Sherren Mocha, MD  lisinopril (PRINIVIL,ZESTRIL) 2.5 MG tablet TAKE 1 TABLET(2.5 MG) BY MOUTH DAILY Patient taking differently: Take 2.5 mg by mouth daily.  09/09/18  Yes Sherren Mocha, MD  metoprolol tartrate (LOPRESSOR) 25 MG tablet TAKE 1/2 TABLET(12.5 MG) BY MOUTH TWICE DAILY Patient taking differently: Take 12.5 mg by mouth 2 (two) times daily.  09/12/18  Yes Sherren Mocha, MD  pantoprazole (PROTONIX) 40 MG tablet TAKE 1 TABLET BY MOUTH EVERY DAY 09/26/18  Yes Sherren Mocha, MD  prasugrel (EFFIENT) 10 MG TABS tablet TAKE 1 TABLET BY MOUTH DAILY Patient taking differently: Take 10 mg by mouth daily.  09/09/18  Yes Sherren Mocha, MD  rosuvastatin (CRESTOR) 40 MG tablet TAKE 1 TABLET(40 MG) BY MOUTH DAILY Patient taking differently: Take 40 mg by mouth daily.  09/09/18  Yes Sherren Mocha, MD  furosemide (LASIX) 20 MG tablet Take 1 tablet (20 mg total) by mouth daily. 11/12/18   Eber Hong, MD  nitroGLYCERIN (NITROSTAT) 0.4 MG SL tablet Place 1 tablet (0.4 mg total) under the tongue every 5 (five) minutes as needed for chest pain (up to 3 doses). 11/10/16   Corky Crafts, MD    Family History Family History  Problem Relation Age of Onset  . Hypertension Mother   . Alzheimer's disease Mother   . Alcohol abuse Father   . Cancer Father 39       "Throat"  . Diabetes type II Brother   . Cancer Brother 68       throat cancer  . Heart disease Neg Hx     Social History Social History   Tobacco Use  . Smoking status: Former Smoker    Packs/day: 0.25    Years: 3.00    Pack years: 0.75    Last attempt to quit: 11/30/1998    Years since quitting: 19.9  . Smokeless tobacco: Never Used  Substance Use Topics  . Alcohol use: No    Alcohol/week: 1.0 standard drinks    Types: 1 Standard drinks or equivalent per week  . Drug use: No     Allergies   Patient has no known allergies.   Review of Systems Review of Systems  All other systems reviewed and are negative.  Physical Exam Updated Vital Signs BP (!) 148/79   Pulse 96   Temp 97.9 F (36.6 C) (Oral)   Resp 20   Ht 1.753 m (5\' 9" )   Wt 104.3 kg   SpO2 100%   BMI 33.97 kg/m   Physical Exam Vitals signs and nursing note reviewed.  Constitutional:      General: He is not in acute distress.    Appearance: He is well-developed.  HENT:     Head: Normocephalic and atraumatic.     Mouth/Throat:     Pharynx: No oropharyngeal exudate.  Eyes:     General: No scleral icterus.       Right eye: No discharge.        Left eye: No discharge.     Conjunctiva/sclera: Conjunctivae normal.     Pupils: Pupils are equal, round, and reactive to light.  Neck:     Musculoskeletal: Normal range of motion and neck supple.     Thyroid: No thyromegaly.     Vascular: No JVD.  Cardiovascular:     Rate and Rhythm: Normal rate and regular rhythm.     Heart sounds: Normal heart sounds. No murmur. No friction rub. No gallop.   Pulmonary:     Effort: Pulmonary effort is normal. No respiratory distress.     Breath sounds: Normal breath sounds. No wheezing or rales.  Abdominal:     General:  Bowel sounds are normal. There is no distension.     Palpations: Abdomen is soft. There is no mass.     Tenderness: There is no abdominal tenderness.  Musculoskeletal: Normal range of motion.        General: Tenderness present.     Right lower leg: Edema present.     Left lower leg: Edema present.     Comments: The patient has neuropathic tenderness to his bilateral lower extremities from the mid shin through the feet.  There is pitting edema to the bilateral lower extremities which is symmetrical in that same distribution.  Knees proximal appear normal.  Upper extremities normal.  Lymphadenopathy:     Cervical: No cervical adenopathy.  Skin:    General: Skin is warm and dry.     Findings: No erythema or rash.  Neurological:     Mental Status: He is alert.     Coordination: Coordination normal.     Comments: The patient's gait coordination and speech are all normal, no facial droop, normal strength in all 4 extremities.  Psychiatric:        Behavior: Behavior normal.      ED Treatments / Results  Labs (all labs ordered are listed, but only abnormal results are displayed) Labs Reviewed  BASIC METABOLIC PANEL - Abnormal; Notable for the following components:      Result Value   CO2 21 (*)    Glucose, Bld 496 (*)    Calcium 8.7 (*)    All other components within normal limits  URINALYSIS, ROUTINE W REFLEX MICROSCOPIC - Abnormal; Notable for the following components:   Color, Urine STRAW (*)    Glucose, UA >=500 (*)    All other components within normal limits  HEPATIC FUNCTION PANEL - Abnormal; Notable for the following components:   Total Protein 6.1 (*)    All other components within normal limits  CBG MONITORING, ED - Abnormal; Notable for the following components:   Glucose-Capillary 393 (*)    All other components within normal limits  I-STAT VENOUS BLOOD GAS, ED -  Abnormal; Notable for the following components:   Acid-base deficit 4.0 (*)    All other components within  normal limits  CBG MONITORING, ED - Abnormal; Notable for the following components:   Glucose-Capillary 508 (*)    All other components within normal limits  CBG MONITORING, ED - Abnormal; Notable for the following components:   Glucose-Capillary 397 (*)    All other components within normal limits  CBC  BRAIN NATRIURETIC PEPTIDE    EKG EKG Interpretation  Date/Time:  Saturday November 12 2018 07:30:04 EST Ventricular Rate:  93 PR Interval:    QRS Duration: 86 QT Interval:  339 QTC Calculation: 422 R Axis:   33 Text Interpretation:  Sinus rhythm Low voltage, precordial leads Baseline wander in lead(s) I III aVL since last tracing no significant change Confirmed by Eber Hong ([id number]) on 11/12/2018 8:34:49 AM   Radiology No results found.  Procedures Procedures (including critical care time)  Medications Ordered in ED Medications  sodium chloride 0.9 % bolus 1,000 mL (0 mLs Intravenous Stopped 11/12/18 0945)  insulin aspart (novoLOG) injection 10 Units (10 Units Intravenous Given 11/12/18 0815)  insulin aspart (novoLOG) injection 10 Units (10 Units Intravenous Given 11/12/18 0942)     Initial Impression / Assessment and Plan / ED Course  I have reviewed the triage vital signs and the nursing notes.  Pertinent labs & imaging results that were available during my care of the patient were reviewed by me and considered in my medical decision making (see chart for details).  Clinical Course as of Nov 13 1111  Sat Nov 12, 2018  0940 Hepatic function panel was unremarkable, no signs of liver dysfunction, BNP is less than 100 making congestive heart failure a very unlikely etiology.   [BM]  0941 After insulin and fluids blood sugar still around 500, will give more insulin the patient appears well and has received IV fluids and is doing well.   [BM]  0941 Suspect the edema and neuropathy of the legs is more chronic in nature, will be given some short-term Lasix for this.    [BM]  1106 CBg has improved and patient has tolerated clear liquids and ambulated in the ER without difficulty Stable appearing for d/c.   [BM]    Clinical Course User Index [BM] Eber Hong, MD   Lab work does not reveal any specific abnormalities, the patient has hyperglycemia but no signs of diabetic ketoacidosis.  At this time I think it would be reasonable to give the patient hydration, check a BNP though after review of the medical record he had a normal ejection fraction and a normal echocardiogram in 2015 at the time that he had a myocardial infarction.  He is not on any medications that would potentially cause the swelling such as amlodipine, he is a diabetic and has progressive neuropathy which is likely causing his pain in conjunction with increased edema.  The plan is as follows  Insulin  IV fluids  Check a BNP  Labs unremarkable, patient stable for discharge Lasix daily for 7 days, patient in agreement  Final Clinical Impressions(s) / ED Diagnoses   Final diagnoses:  Hyperglycemia  Peripheral edema    ED Discharge Orders         Ordered    furosemide (LASIX) 20 MG tablet  Daily     11/12/18 1111           Eber Hong, MD 11/12/18 1112

## 2018-11-16 ENCOUNTER — Other Ambulatory Visit: Payer: Self-pay | Admitting: Emergency Medicine

## 2018-11-17 ENCOUNTER — Telehealth: Payer: Self-pay | Admitting: Family Medicine

## 2018-11-17 NOTE — Telephone Encounter (Signed)
Submitted PA for One Touch Verio Strips but looks like it may have been ordered by a Dr.  At [place]? Forms are placed in Dr. Alver Fisher box with a note.

## 2018-11-18 DIAGNOSIS — R69 Illness, unspecified: Secondary | ICD-10-CM | POA: Diagnosis not present

## 2018-11-19 ENCOUNTER — Observation Stay (HOSPITAL_BASED_OUTPATIENT_CLINIC_OR_DEPARTMENT_OTHER): Payer: Medicare HMO

## 2018-11-19 ENCOUNTER — Observation Stay (HOSPITAL_COMMUNITY)
Admission: EM | Admit: 2018-11-19 | Discharge: 2018-11-22 | Disposition: A | Discharge status: Home / Self Care | Payer: Medicare HMO | Attending: Internal Medicine | Admitting: Internal Medicine

## 2018-11-19 ENCOUNTER — Other Ambulatory Visit: Payer: Self-pay

## 2018-11-19 ENCOUNTER — Observation Stay (HOSPITAL_COMMUNITY): Payer: Medicare HMO

## 2018-11-19 ENCOUNTER — Encounter (HOSPITAL_COMMUNITY): Payer: Self-pay | Admitting: *Deleted

## 2018-11-19 DIAGNOSIS — Z794 Long term (current) use of insulin: Secondary | ICD-10-CM | POA: Insufficient documentation

## 2018-11-19 DIAGNOSIS — E1151 Type 2 diabetes mellitus with diabetic peripheral angiopathy without gangrene: Secondary | ICD-10-CM | POA: Diagnosis not present

## 2018-11-19 DIAGNOSIS — Z955 Presence of coronary angioplasty implant and graft: Secondary | ICD-10-CM | POA: Diagnosis not present

## 2018-11-19 DIAGNOSIS — E1142 Type 2 diabetes mellitus with diabetic polyneuropathy: Secondary | ICD-10-CM | POA: Insufficient documentation

## 2018-11-19 DIAGNOSIS — E1165 Type 2 diabetes mellitus with hyperglycemia: Secondary | ICD-10-CM | POA: Diagnosis not present

## 2018-11-19 DIAGNOSIS — Z23 Encounter for immunization: Secondary | ICD-10-CM | POA: Insufficient documentation

## 2018-11-19 DIAGNOSIS — Z79899 Other long term (current) drug therapy: Secondary | ICD-10-CM | POA: Insufficient documentation

## 2018-11-19 DIAGNOSIS — G459 Transient cerebral ischemic attack, unspecified: Secondary | ICD-10-CM

## 2018-11-19 DIAGNOSIS — R0789 Other chest pain: Secondary | ICD-10-CM | POA: Diagnosis not present

## 2018-11-19 DIAGNOSIS — E782 Mixed hyperlipidemia: Secondary | ICD-10-CM

## 2018-11-19 DIAGNOSIS — Z7982 Long term (current) use of aspirin: Secondary | ICD-10-CM | POA: Insufficient documentation

## 2018-11-19 DIAGNOSIS — Z6834 Body mass index (BMI) 34.0-34.9, adult: Secondary | ICD-10-CM | POA: Insufficient documentation

## 2018-11-19 DIAGNOSIS — R471 Dysarthria and anarthria: Secondary | ICD-10-CM | POA: Diagnosis not present

## 2018-11-19 DIAGNOSIS — R29810 Facial weakness: Secondary | ICD-10-CM | POA: Diagnosis not present

## 2018-11-19 DIAGNOSIS — K219 Gastro-esophageal reflux disease without esophagitis: Secondary | ICD-10-CM | POA: Insufficient documentation

## 2018-11-19 DIAGNOSIS — G63 Polyneuropathy in diseases classified elsewhere: Secondary | ICD-10-CM | POA: Diagnosis not present

## 2018-11-19 DIAGNOSIS — IMO0002 Reserved for concepts with insufficient information to code with codable children: Secondary | ICD-10-CM | POA: Diagnosis present

## 2018-11-19 DIAGNOSIS — E785 Hyperlipidemia, unspecified: Secondary | ICD-10-CM | POA: Insufficient documentation

## 2018-11-19 DIAGNOSIS — R569 Unspecified convulsions: Secondary | ICD-10-CM

## 2018-11-19 DIAGNOSIS — R55 Syncope and collapse: Secondary | ICD-10-CM

## 2018-11-19 DIAGNOSIS — I10 Essential (primary) hypertension: Secondary | ICD-10-CM | POA: Insufficient documentation

## 2018-11-19 DIAGNOSIS — M549 Dorsalgia, unspecified: Secondary | ICD-10-CM

## 2018-11-19 DIAGNOSIS — R079 Chest pain, unspecified: Secondary | ICD-10-CM | POA: Diagnosis present

## 2018-11-19 DIAGNOSIS — R42 Dizziness and giddiness: Secondary | ICD-10-CM

## 2018-11-19 DIAGNOSIS — E7800 Pure hypercholesterolemia, unspecified: Secondary | ICD-10-CM | POA: Diagnosis not present

## 2018-11-19 DIAGNOSIS — Z87891 Personal history of nicotine dependence: Secondary | ICD-10-CM | POA: Diagnosis not present

## 2018-11-19 DIAGNOSIS — I209 Angina pectoris, unspecified: Secondary | ICD-10-CM | POA: Diagnosis not present

## 2018-11-19 DIAGNOSIS — I2510 Atherosclerotic heart disease of native coronary artery without angina pectoris: Secondary | ICD-10-CM | POA: Diagnosis not present

## 2018-11-19 DIAGNOSIS — E669 Obesity, unspecified: Secondary | ICD-10-CM | POA: Insufficient documentation

## 2018-11-19 DIAGNOSIS — Z791 Long term (current) use of non-steroidal anti-inflammatories (NSAID): Secondary | ICD-10-CM | POA: Insufficient documentation

## 2018-11-19 DIAGNOSIS — F329 Major depressive disorder, single episode, unspecified: Secondary | ICD-10-CM | POA: Diagnosis not present

## 2018-11-19 DIAGNOSIS — I252 Old myocardial infarction: Secondary | ICD-10-CM | POA: Insufficient documentation

## 2018-11-19 DIAGNOSIS — G629 Polyneuropathy, unspecified: Secondary | ICD-10-CM

## 2018-11-19 DIAGNOSIS — K2270 Barrett's esophagus without dysplasia: Secondary | ICD-10-CM | POA: Diagnosis not present

## 2018-11-19 LAB — I-STAT TROPONIN, ED: Troponin i, poc: 0.01 ng/mL (ref 0.00–0.08)

## 2018-11-19 LAB — I-STAT CHEM 8, ED
BUN: 8 mg/dL (ref 6–20)
CHLORIDE: 104 mmol/L (ref 98–111)
Calcium, Ion: 1.11 mmol/L — ABNORMAL LOW (ref 1.15–1.40)
Creatinine, Ser: 0.8 mg/dL (ref 0.61–1.24)
Glucose, Bld: 337 mg/dL — ABNORMAL HIGH (ref 70–99)
HCT: 38 % — ABNORMAL LOW (ref 39.0–52.0)
Hemoglobin: 12.9 g/dL — ABNORMAL LOW (ref 13.0–17.0)
Potassium: 4.1 mmol/L (ref 3.5–5.1)
Sodium: 137 mmol/L (ref 135–145)
TCO2: 25 mmol/L (ref 22–32)

## 2018-11-19 LAB — CBG MONITORING, ED: Glucose-Capillary: 298 mg/dL — ABNORMAL HIGH (ref 70–99)

## 2018-11-19 LAB — GLUCOSE, CAPILLARY: Glucose-Capillary: 481 mg/dL — ABNORMAL HIGH (ref 70–99)

## 2018-11-19 LAB — BRAIN NATRIURETIC PEPTIDE: B Natriuretic Peptide: 10.2 pg/mL (ref 0.0–100.0)

## 2018-11-19 LAB — CBC
HCT: 39.4 % (ref 39.0–52.0)
Hemoglobin: 12.8 g/dL — ABNORMAL LOW (ref 13.0–17.0)
MCH: 28.3 pg (ref 26.0–34.0)
MCHC: 32.5 g/dL (ref 30.0–36.0)
MCV: 87.2 fL (ref 80.0–100.0)
NRBC: 0 % (ref 0.0–0.2)
Platelets: 257 10*3/uL (ref 150–400)
RBC: 4.52 MIL/uL (ref 4.22–5.81)
RDW: 13.2 % (ref 11.5–15.5)
WBC: 4.7 10*3/uL (ref 4.0–10.5)

## 2018-11-19 LAB — URINALYSIS, ROUTINE W REFLEX MICROSCOPIC
BILIRUBIN URINE: NEGATIVE
Bacteria, UA: NONE SEEN
Glucose, UA: >=500 mg/dL — AB
HGB URINE DIPSTICK: NEGATIVE
Ketones, ur: 5 mg/dL — AB
Leukocytes, UA: NEGATIVE
Nitrite: NEGATIVE
Protein, ur: NEGATIVE mg/dL
Specific Gravity, Urine: 1.017 (ref 1.005–1.030)
pH: 5 (ref 5.0–8.0)

## 2018-11-19 LAB — TROPONIN I
Troponin I: <0.03 ng/mL (ref <0.03)
Troponin I: <0.03 ng/mL (ref <0.03)
Troponin I: <0.03 ng/mL (ref <0.03)

## 2018-11-19 LAB — ECHOCARDIOGRAM COMPLETE

## 2018-11-19 MED ORDER — SODIUM CHLORIDE 0.9 % IV SOLN
INTRAVENOUS | Status: DC
  Administered 2018-11-19: 19:00:00 via INTRAVENOUS

## 2018-11-19 MED ORDER — METOPROLOL TARTRATE 12.5 MG HALF TABLET
12.5000 mg | ORAL_TABLET | Freq: Two times a day (BID) | ORAL | Status: DC
  Administered 2018-11-19 – 2018-11-22 (×7): 12.5 mg via ORAL
  Filled 2018-11-19 (×7): qty 1

## 2018-11-19 MED ORDER — ENOXAPARIN SODIUM 40 MG/0.4ML ~~LOC~~ SOLN
40.0000 mg | SUBCUTANEOUS | Status: DC
  Administered 2018-11-19 – 2018-11-21 (×3): 40 mg via SUBCUTANEOUS
  Filled 2018-11-19 (×3): qty 0.4

## 2018-11-19 MED ORDER — LORAZEPAM 2 MG/ML IJ SOLN
INTRAMUSCULAR | Status: AC
  Filled 2018-11-19: qty 1

## 2018-11-19 MED ORDER — ONDANSETRON HCL 4 MG/2ML IJ SOLN: 4.0000 mg | Freq: Four times a day (QID) | INTRAMUSCULAR | Status: DC | PRN

## 2018-11-19 MED ORDER — INSULIN ASPART 100 UNIT/ML ~~LOC~~ SOLN
15.0000 [IU] | Freq: Once | SUBCUTANEOUS | Status: AC
  Administered 2018-11-19: 15 [IU] via SUBCUTANEOUS

## 2018-11-19 MED ORDER — STROKE: EARLY STAGES OF RECOVERY BOOK: Freq: Once | Status: DC

## 2018-11-19 MED ORDER — ASPIRIN 81 MG PO CHEW
324.0000 mg | CHEWABLE_TABLET | Freq: Once | ORAL | Status: AC
  Administered 2018-11-19: 324 mg via ORAL
  Filled 2018-11-19: qty 4

## 2018-11-19 MED ORDER — LORAZEPAM 2 MG/ML IJ SOLN
2.0000 mg | Freq: Once | INTRAMUSCULAR | Status: AC | PRN
  Administered 2018-11-19: 2 mg via INTRAVENOUS
  Filled 2018-11-19: qty 1

## 2018-11-19 MED ORDER — INSULIN ASPART 100 UNIT/ML ~~LOC~~ SOLN
0.0000 [IU] | Freq: Three times a day (TID) | SUBCUTANEOUS | Status: DC
  Administered 2018-11-20: 3 [IU] via SUBCUTANEOUS
  Administered 2018-11-21: 2 [IU] via SUBCUTANEOUS
  Administered 2018-11-21: 11 [IU] via SUBCUTANEOUS
  Administered 2018-11-21: 8 [IU] via SUBCUTANEOUS
  Administered 2018-11-22: 2 [IU] via SUBCUTANEOUS
  Administered 2018-11-22: 11 [IU] via SUBCUTANEOUS

## 2018-11-19 MED ORDER — MORPHINE SULFATE (PF) 2 MG/ML IV SOLN
2.0000 mg | INTRAVENOUS | Status: DC | PRN
  Administered 2018-11-20: 2 mg via INTRAVENOUS
  Filled 2018-11-19: qty 1

## 2018-11-19 MED ORDER — INSULIN DETEMIR 100 UNIT/ML ~~LOC~~ SOLN
40.0000 [IU] | Freq: Every day | SUBCUTANEOUS | Status: DC
  Administered 2018-11-19: 40 [IU] via SUBCUTANEOUS
  Filled 2018-11-19 (×3): qty 0.4

## 2018-11-19 MED ORDER — LISINOPRIL 2.5 MG PO TABS
2.5000 mg | ORAL_TABLET | Freq: Every day | ORAL | Status: DC
  Administered 2018-11-19 – 2018-11-22 (×4): 2.5 mg via ORAL
  Filled 2018-11-19 (×4): qty 1

## 2018-11-19 MED ORDER — PNEUMOCOCCAL VAC POLYVALENT 25 MCG/0.5ML IJ INJ
0.5000 mL | INJECTION | INTRAMUSCULAR | Status: AC
  Administered 2018-11-20: 0.5 mL via INTRAMUSCULAR
  Filled 2018-11-19: qty 0.5

## 2018-11-19 MED ORDER — GABAPENTIN 100 MG PO CAPS
100.0000 mg | ORAL_CAPSULE | Freq: Two times a day (BID) | ORAL | Status: DC
  Administered 2018-11-19 – 2018-11-22 (×6): 100 mg via ORAL
  Filled 2018-11-19 (×6): qty 1

## 2018-11-19 MED ORDER — HYDROCODONE-ACETAMINOPHEN 7.5-325 MG PO TABS
1.0000 | ORAL_TABLET | Freq: Four times a day (QID) | ORAL | Status: DC | PRN
  Administered 2018-11-19 – 2018-11-22 (×9): 1 via ORAL
  Filled 2018-11-19 (×9): qty 1

## 2018-11-19 MED ORDER — INSULIN DETEMIR 100 UNIT/ML ~~LOC~~ SOLN
45.0000 [IU] | Freq: Every morning | SUBCUTANEOUS | Status: DC
  Administered 2018-11-20 – 2018-11-22 (×3): 45 [IU] via SUBCUTANEOUS
  Filled 2018-11-19 (×3): qty 0.45

## 2018-11-19 MED ORDER — GADOBUTROL 1 MMOL/ML IV SOLN
10.0000 mL | Freq: Once | INTRAVENOUS | Status: AC | PRN
  Administered 2018-11-19: 10 mL via INTRAVENOUS

## 2018-11-19 MED ORDER — ACETAMINOPHEN 325 MG PO TABS
650.0000 mg | ORAL_TABLET | ORAL | Status: DC | PRN
  Administered 2018-11-20: 650 mg via ORAL
  Filled 2018-11-19: qty 2

## 2018-11-19 MED ORDER — INFLUENZA VAC SPLIT QUAD 0.5 ML IM SUSY
0.5000 mL | PREFILLED_SYRINGE | INTRAMUSCULAR | Status: AC
  Administered 2018-11-20: 0.5 mL via INTRAMUSCULAR
  Filled 2018-11-19: qty 0.5

## 2018-11-19 MED ORDER — ROSUVASTATIN CALCIUM 20 MG PO TABS
40.0000 mg | ORAL_TABLET | Freq: Every day | ORAL | Status: DC
  Administered 2018-11-19 – 2018-11-22 (×4): 40 mg via ORAL
  Filled 2018-11-19 (×4): qty 2

## 2018-11-19 MED ORDER — INSULIN ASPART 100 UNIT/ML ~~LOC~~ SOLN: 0.0000 [IU] | Freq: Every day | SUBCUTANEOUS | Status: DC

## 2018-11-19 MED ORDER — LORAZEPAM 2 MG/ML IJ SOLN
1.0000 mg | Freq: Once | INTRAMUSCULAR | Status: AC
  Administered 2018-11-19: 1 mg via INTRAVENOUS

## 2018-11-19 MED ORDER — INSULIN DETEMIR 100 UNIT/ML FLEXPEN: 40.0000 [IU] | PEN_INJECTOR | SUBCUTANEOUS | Status: DC

## 2018-11-19 MED ORDER — PANTOPRAZOLE SODIUM 40 MG PO TBEC
40.0000 mg | DELAYED_RELEASE_TABLET | Freq: Every day | ORAL | Status: DC
  Administered 2018-11-19 – 2018-11-22 (×4): 40 mg via ORAL
  Filled 2018-11-19 (×4): qty 1

## 2018-11-19 MED ORDER — NITROGLYCERIN 0.4 MG SL SUBL: 0.4000 mg | SUBLINGUAL_TABLET | SUBLINGUAL | Status: DC | PRN

## 2018-11-19 MED ORDER — PRASUGREL HCL 10 MG PO TABS
10.0000 mg | ORAL_TABLET | Freq: Every day | ORAL | Status: DC
  Administered 2018-11-19 – 2018-11-20 (×2): 10 mg via ORAL
  Filled 2018-11-19 (×2): qty 1

## 2018-11-19 NOTE — ED Notes (Signed)
ED TO INPATIENT HANDOFF REPORT  Name/Age/Gender [name] DamesKevin J [name] 62 y.o. male  Code Status Code Status History    Date Active Date Inactive Code Status Order ID Comments User Context   09/29/2017 1855 10/01/2017 1728 Full Code [id number]  Leatha GildingGherghe, Costin M, MD Inpatient   09/22/2017 1723 09/24/2017 1602 Full Code [id number]  Haydee SalterHobbs, Phillip M, MD ED   04/12/2016 1424 04/14/2016 1728 Full Code [id number]  Jeralyn BennettZamora, Ezequiel, MD Inpatient   05/30/2015 1754 05/31/2015 2049 Full Code [id number]  Calvert Cantorizwan, Saima, MD Inpatient   06/14/2014 1638 06/18/2014 1414 Full Code [id number]  Corky CraftsVaranasi, Jayadeep S, MD Inpatient   06/14/2014 1127 06/14/2014 1638 Full Code [id number]  Dwana MelenaHager, Bryan W, PA-C Inpatient   07/22/2013 0019 07/24/2013 1738 Full Code [id number]  Eduard ClosKakrakandy, Arshad N, MD Inpatient   10/01/2012 0905 10/04/2012 1509 Full Code [id number]  Joylene DraftShaffer, Shae Lee N, RN Inpatient   10/01/2012 0530 10/01/2012 0905 Full Code [id number]  Hilario Quarryay, Danielle S, MD ED   04/08/2012 1640 04/09/2012 1604 Full Code [id number]  Raenette Roverhigpen, Jacqueline Renee, RN Inpatient   02/28/2012 2356 03/01/2012 1745 Full Code [id number]  Forbes CellarMbemena, Queeneth Chibuzo, RN Inpatient   12/24/2011 1907 12/26/2011 1453 Full Code [id number]  Rhetta MuraSamtani, Jai-Gurmukh, MD ED   11/16/2011 1519 11/16/2011 2241 Full Code [id number]  Dione BoozeGlick, David, MD ED      Home/SNF/Other Home  Chief Complaint syncope, dizziness, pain  Level of Care/Admitting Diagnosis ED Disposition    ED Disposition Condition Comment   Admit  [facility] [[id number]]  Level of Care: Telemetry [5]  Admit to tele based on following criteria: Monitor QTC interval  Admit to tele based on following criteria: Eval of Syncope  Diagnosis: Dizziness [[id number]][[id number]]  Admitting Physician: Marguerita MerlesSHEIKH, OMAIR LATIF [[id number]]  Attending Physician: Marguerita MerlesSHEIKH, OMAIR LATIF [[id number]]  PT Class (Do Not Modify): Observation [104]  PT Acc Code (Do Not Modify): Observation [10022]       Medical History Past  Medical History:  Diagnosis Date  . Barrett's esophagus   . CAD (coronary artery disease)    a. NSTEMI 05/2014 - occluded RCA dominant proximal s/p asp-thrombectomy/DES to RCA, minimal LAD/LCx, EF 50% by cath, 65-70% by echo.  . Depression   . Diabetes type 2, uncontrolled (HCC)   . Former tobacco use   . GERD (gastroesophageal reflux disease)   . Hemorrhoids    hx of  . Hypercholesterolemia   . Hypertension   . MI (myocardial infarction) (HCC)   . Peripheral neuropathy     Allergies No Known Allergies  IV Location/Drains/Wounds Patient Lines/Drains/Airways Status   Active Line/Drains/Airways    Name:   Placement date:   Placement time:   Site:   Days:   Peripheral IV 11/19/18 Right Hand   11/19/18    0829    Hand   less than 1          Labs/Imaging Results for orders placed or performed during the hospital encounter of 11/19/18 (from the past 48 hour(s))  Urinalysis, Routine w reflex microscopic     Status: Abnormal   Collection Time: 11/19/18  7:55 AM  Result Value Ref Range   Color, Urine STRAW (A) YELLOW   APPearance CLEAR CLEAR   Specific Gravity, Urine 1.017 1.005 - 1.030   pH 5.0 5.0 - 8.0   Glucose, UA >=500 (A) NEGATIVE mg/dL   Hgb urine dipstick NEGATIVE NEGATIVE   Bilirubin Urine NEGATIVE NEGATIVE   Ketones, ur 5 (A) NEGATIVE mg/dL   Protein, ur  NEGATIVE NEGATIVE mg/dL   Nitrite NEGATIVE NEGATIVE   Leukocytes, UA NEGATIVE NEGATIVE   RBC / HPF 0-5 0 - 5 RBC/hpf   WBC, UA 0-5 0 - 5 WBC/hpf   Bacteria, UA NONE SEEN NONE SEEN   Squamous Epithelial / LPF 0-5 0 - 5    Comment: Performed at [facility], [address], [place], [place] [address]  CBC     Status: Abnormal   Collection Time: 11/19/18  8:19 AM  Result Value Ref Range   WBC 4.7 4.0 - 10.5 K/uL   RBC 4.52 4.22 - 5.81 MIL/uL   Hemoglobin 12.8 (L) 13.0 - 17.0 g/dL   HCT 47.8 29.5 - 62.1 %   MCV 87.2 80.0 - 100.0 fL   MCH 28.3 26.0 - 34.0 pg   MCHC 32.5 30.0 - 36.0 g/dL    RDW 30.8 65.7 - 84.6 %   Platelets 257 150 - 400 K/uL   nRBC 0.0 0.0 - 0.2 %    Comment: Performed at [facility], [address] [place], [place] [address]  Troponin I - ONCE - STAT     Status: None   Collection Time: 11/19/18  8:19 AM  Result Value Ref Range   Troponin I <0.03 <0.03 ng/mL    Comment: Performed at [facility], [address] [place], [place] [address]  Brain natriuretic peptide     Status: None   Collection Time: 11/19/18  8:19 AM  Result Value Ref Range   B Natriuretic Peptide 10.2 0.0 - 100.0 pg/mL    Comment: Performed at [facility], [address] [place], [place] [address]  CBG monitoring, ED     Status: Abnormal   Collection Time: 11/19/18  8:25 AM  Result Value Ref Range   Glucose-Capillary 298 (H) 70 - 99 mg/dL  I-stat troponin, ED     Status: None   Collection Time: 11/19/18  8:32 AM  Result Value Ref Range   Troponin i, poc 0.01 0.00 - 0.08 ng/mL   Comment 3            Comment: Due to the release kinetics of cTnI, a negative result within the first hours of the onset of symptoms does not rule out myocardial infarction with certainty. If myocardial infarction is still suspected, repeat the test at appropriate intervals.   I-stat chem 8, ed     Status: Abnormal   Collection Time: 11/19/18  8:33 AM  Result Value Ref Range   Sodium 137 135 - 145 mmol/L   Potassium 4.1 3.5 - 5.1 mmol/L   Chloride 104 98 - 111 mmol/L   BUN 8 6 - 20 mg/dL   Creatinine, Ser 0.27 0.61 - 1.24 mg/dL   Glucose, Bld 253 (H) 70 - 99 mg/dL   Calcium, Ion 6.64 (L) 1.15 - 1.40 mmol/L   TCO2 25 22 - 32 mmol/L   Hemoglobin 12.9 (L) 13.0 - 17.0 g/dL   HCT 40.3 (L) 47.4 - 25.9 %  Troponin I - Now Then Q6H     Status: None   Collection Time: 11/19/18 11:18 AM  Result Value Ref Range   Troponin I <0.03 <0.03 ng/mL    Comment: Performed at [facility], [address], [place], [place] [address]    Ct Head Wo Contrast  Result Date: 11/19/2018 CLINICAL DATA:  Syncopal episode last night, dizziness, history heart disease, type II diabetes mellitus, coronary artery disease post MI,  hypertension, former smoker EXAM: CT HEAD WITHOUT CONTRAST TECHNIQUE: Contiguous axial images were obtained from the base of the skull through the vertex without intravenous contrast. Sagittal and coronal MPR images reconstructed from axial data set. COMPARISON:  09/06/2018 FINDINGS: Brain: Normal ventricular morphology. No midline shift or mass effect. Normal appearance of brain parenchyma. No intracranial hemorrhage, mass lesion, evidence of acute infarction, or extra-axial fluid collection. Vascular: No hyperdense vessels Skull: Intact, unremarkable Sinuses/Orbits: Clear Other: N/A IMPRESSION: Normal exam. Electronically Signed   By: Ulyses Southward M.D.   On: 11/19/2018 11:53   EKG Interpretation  Date/Time:  Saturday November 19 2018 07:53:49 EST Ventricular Rate:  86 PR Interval:    QRS Duration: 78 QT Interval:  362 QTC Calculation: 433 R Axis:   19 Text Interpretation:  Normal sinus rhythm ST elevation in v2 isolated no reciprocal depression Reconfirmed by Derwood Kaplan [phone]) on 11/19/2018 8:11:53 AM Also confirmed by Derwood Kaplan [phone]), editor Barbette Hair [phone])  on 11/19/2018 12:21:16 PM   Pending Labs Unresulted Labs (From admission, onward)    Start     Ordered   11/19/18 1100  Troponin I - Now Then Q6H  Now then every 6 hours,   R     11/19/18 0954   Signed and Held  Hemoglobin A1c  Tomorrow morning,   R     Signed and Held   Signed and Held  Lipid panel  Tomorrow morning,   R    Comments:  Fasting    Signed and Held   Signed and Held  HIV antibody (Routine Testing)  Once,   R     Signed and Held   Signed and Held  Troponin I - Now Then Q3H  Now then every 3 hours,   TIMED     Signed and Held   Signed and Held  Creatinine, serum  (enoxaparin (LOVENOX)    CrCl >/= 30 ml/min)   Weekly,   R    Comments:  while on enoxaparin therapy    Signed and Held          Vitals/Pain Today's Vitals   11/19/18 1011 11/19/18 1200 11/19/18 1230 11/19/18 1239  BP:  (!) 162/78 (!) 172/83   Pulse:  100 99   Resp:  19 17   Temp:      TempSrc:      SpO2:  98% 98%   PainSc: 0-No pain   0-No pain    Isolation Precautions No active isolations  Medications Medications  HYDROcodone-acetaminophen (NORCO) 7.5-325 MG per tablet 1 tablet (1 tablet Oral Given 11/19/18 1208)  lisinopril (PRINIVIL,ZESTRIL) tablet 2.5 mg (has no administration in time range)  metoprolol tartrate (LOPRESSOR) tablet 12.5 mg (12.5 mg Oral Given 11/19/18 1208)  LORazepam (ATIVAN) 2 MG/ML injection (has no administration in time range)  LORazepam (ATIVAN) injection 2 mg (has no administration in time range)  aspirin chewable tablet 324 mg (324 mg Oral Given 11/19/18 1118)  LORazepam (ATIVAN) injection 1 mg (1 mg Intravenous Given 11/19/18 1334)    Mobility walks

## 2018-11-19 NOTE — ED Notes (Signed)
Patient transported to CT 

## 2018-11-19 NOTE — ED Provider Notes (Signed)
[facility]-EMERGENCY DEPT Provider Note   CSN: [id number] Arrival date & time: 11/19/18  1191     History   Chief Complaint Chief Complaint  Patient presents with  . Dizziness  . Weakness    HPI [name] [name] is a 62 y.o. male.  HPI  62 year old male with history of CAD, IDDM, hypertension hyperlipidemia comes in with chief complaint of dizziness, weakness and back pain.  Patient reports that yesterday started noticing back pain that is located in the mid scapular region.  Pain has been intermittent, but similar to the pain he was having when he had the MI 2 years ago.  He denies any associated chest pain, shortness of breath, nausea or diaphoresis.  Patient also reports that since yesterday has been having episodes of dizziness. Dizziness is described as lightheadedness and it is intermittent, and not positional.  He denies any associated vision change, focal numbness or weakness.  He thinks that last night he might of passed out around midnight while he was sitting on his couch.  Patient normally goes to bed at 2:00 and knows for fact that he was not trying to fall asleep at that time.  He denies any chest pain, palpitations prior to this episode.  Review of system is negative for any headaches / trauma.  Past Medical History:  Diagnosis Date  . Barrett's esophagus   . CAD (coronary artery disease)    a. NSTEMI 05/2014 - occluded RCA dominant proximal s/p asp-thrombectomy/DES to RCA, minimal LAD/LCx, EF 50% by cath, 65-70% by echo.  . Depression   . Diabetes type 2, uncontrolled (HCC)   . Former tobacco use   . GERD (gastroesophageal reflux disease)   . Hemorrhoids    hx of  . Hypercholesterolemia   . Hypertension   . MI (myocardial infarction) (HCC)   . Peripheral neuropathy     Patient Active Problem List   Diagnosis Date Noted  . Chest pain, atypical 11/19/2018  . Bilateral low back pain without sciatica 04/12/2018  . Vitamin D deficiency  04/07/2018  . Pill dysphagia 04/07/2018  . Enlarged thyroid gland 04/07/2018  . Labile blood glucose 04/07/2018  . Brittle diabetes mellitus (HCC) 04/07/2018  . AKI (acute kidney injury) (HCC) 09/22/2017  . Chronic pain syndrome 08/18/2016  . Diabetic ketoacidosis (HCC) 04/12/2016  . Hip pain, bilateral 08/23/2015  . Chronic neck pain 08/23/2015  . DKA (diabetic ketoacidoses) (HCC) 05/30/2015  . Acute renal failure (HCC) 05/30/2015  . Restless legs 08/31/2014  . Uncontrolled diabetes mellitus type 2 with peripheral artery disease (HCC) 08/10/2014  . Old myocardial infarction 08/10/2014  . CAD (coronary artery disease) 06/18/2014  . Hyperlipidemia 06/18/2014  . NSTEMI (non-ST elevated myocardial infarction) (HCC) 06/14/2014  . N&V (nausea and vomiting) 07/21/2013  . Chronic pain in left shoulder 04/06/2013  . Peripheral neuropathy 02/28/2012  . Depression 12/25/2011  . GERD (gastroesophageal reflux disease) 12/25/2011  . DM (diabetes mellitus), type 2, uncontrolled (HCC) 11/18/2011    Past Surgical History:  Procedure Laterality Date  . LEFT HEART CATHETERIZATION WITH CORONARY ANGIOGRAM N/A 06/14/2014   Procedure: LEFT HEART CATHETERIZATION WITH CORONARY ANGIOGRAM;  Surgeon: Corky Crafts, MD;  Location: [facility] CATH LAB;  Service: Cardiovascular;  Laterality: N/A;  . TONSILLECTOMY          Home Medications    Prior to Admission medications   Medication Sig Start Date End Date Taking? Authorizing Provider  furosemide (LASIX) 20 MG tablet Take 1 tablet (20 mg total)  by mouth daily. 11/12/18  Yes Eber HongMiller, Brian, MD  gabapentin (NEURONTIN) 100 MG capsule TAKE 2 CAPSULES BY MOUTH EVERY MORNING AND AT BEDTIME Patient taking differently: Take 100 mg by mouth 2 (two) times daily. TAKE 2 CAPSULES BY MOUTH EVERY MORNING AND AT BEDTIME 09/09/18  Yes Jones Baleshomas, Eunice L, NP  HYDROcodone-acetaminophen (NORCO) 7.5-325 MG tablet Take 1 tablet by mouth every 6 (six) hours as needed for  moderate pain. 10/03/18  Yes Jones Baleshomas, Eunice L, NP  insulin aspart (NOVOLOG FLEXPEN) 100 UNIT/ML FlexPen INJECT 30 TO 35 UNITS SUBCUTANEOUSLY THREE TIMES DAILY Patient taking differently: Inject 15-30 Units into the skin 3 (three) times daily with meals. INJECT 30 TO 35 UNITS SUBCUTANEOUSLY THREE TIMES DAILY 03/11/18  Yes Sherren MochaShaw, Eva N, MD  LEVEMIR FLEXTOUCH 100 UNIT/ML Pen INJECT 45 UNITS UNDER THE SKIN EVERY MORNING AND 40 UNITS EVERY NIGHT AT BEDTIME Patient taking differently: Inject 40-45 Units into the skin See admin instructions. INJECT 45 UNITS UNDER THE SKIN EVERY MORNING AND 40 UNITS EVERY NIGHT AT BEDTIME 09/28/18  Yes Sherren MochaShaw, Eva N, MD  lisinopril (PRINIVIL,ZESTRIL) 2.5 MG tablet TAKE 1 TABLET(2.5 MG) BY MOUTH DAILY Patient taking differently: Take 2.5 mg by mouth daily.  09/09/18  Yes Sherren MochaShaw, Eva N, MD  metoprolol tartrate (LOPRESSOR) 25 MG tablet TAKE 1/2 TABLET(12.5 MG) BY MOUTH TWICE DAILY Patient taking differently: Take 12.5 mg by mouth 2 (two) times daily.  09/12/18  Yes Sherren MochaShaw, Eva N, MD  nitroGLYCERIN (NITROSTAT) 0.4 MG SL tablet Place 1 tablet (0.4 mg total) under the tongue every 5 (five) minutes as needed for chest pain (up to 3 doses). 11/10/16  Yes Corky CraftsVaranasi, Jayadeep S, MD  pantoprazole (PROTONIX) 40 MG tablet TAKE 1 TABLET BY MOUTH EVERY DAY 09/26/18  Yes Sherren MochaShaw, Eva N, MD  prasugrel (EFFIENT) 10 MG TABS tablet TAKE 1 TABLET BY MOUTH DAILY Patient taking differently: Take 10 mg by mouth daily.  09/09/18  Yes Sherren MochaShaw, Eva N, MD  rosuvastatin (CRESTOR) 40 MG tablet TAKE 1 TABLET(40 MG) BY MOUTH DAILY Patient taking differently: Take 40 mg by mouth daily.  09/09/18  Yes Sherren MochaShaw, Eva N, MD  EQ ASPIRIN ADULT LOW DOSE 81 MG EC tablet TAKE ONE TABLET BY MOUTH ONCE DAILY Patient not taking: Reported on 11/19/2018 07/02/15   Corky CraftsVaranasi, Jayadeep S, MD  ibuprofen (ADVIL,MOTRIN) 400 MG tablet Take 1 tablet (400 mg total) by mouth every 6 (six) hours as needed. Patient not taking: Reported on 11/19/2018  09/06/18   Lorre NickAllen, Anthony, MD    Family History Family History  Problem Relation Age of Onset  . Hypertension Mother   . Alzheimer's disease Mother   . Alcohol abuse Father   . Cancer Father [id number]       "Throat"  . Diabetes type II Brother   . Cancer Brother 68       throat cancer  . Heart disease Neg Hx     Social History Social History   Tobacco Use  . Smoking status: Former Smoker    Packs/day: 0.25    Years: 3.00    Pack years: 0.75    Last attempt to quit: 11/30/1998    Years since quitting: 19.9  . Smokeless tobacco: Never Used  Substance Use Topics  . Alcohol use: No    Alcohol/week: 1.0 standard drinks    Types: 1 Standard drinks or equivalent per week  . Drug use: No     Allergies   Patient has no known allergies.   Review of  Systems Review of Systems  Constitutional: Positive for activity change.  Respiratory: Negative for shortness of breath.   Cardiovascular: Negative for chest pain.  Neurological: Positive for light-headedness.  All other systems reviewed and are negative.    Physical Exam Updated Vital Signs BP (!) 173/89   Pulse 92   Temp 97.9 F (36.6 C) (Oral)   Resp 11   SpO2 100%   Physical Exam Vitals signs and nursing note reviewed.  Constitutional:      Appearance: He is well-developed.  HENT:     Head: Atraumatic.  Neck:     Musculoskeletal: Neck supple.  Cardiovascular:     Rate and Rhythm: Normal rate.  Pulmonary:     Effort: Pulmonary effort is normal.  Skin:    General: Skin is warm.  Neurological:     General: No focal deficit present.     Mental Status: He is alert and oriented to person, place, and time.     Comments: Mild right-sided facial droop Cerebellar exam is normal (finger to nose) Sensory exam normal for bilateral upper and lower extremities - and patient is able to discriminate between sharp and dull. Motor exam is 4+/5     ED Treatments / Results  Labs (all labs ordered are listed, but only  abnormal results are displayed) Labs Reviewed  CBC - Abnormal; Notable for the following components:      Result Value   Hemoglobin 12.8 (*)    All other components within normal limits  CBG MONITORING, ED - Abnormal; Notable for the following components:   Glucose-Capillary 298 (*)    All other components within normal limits  I-STAT CHEM 8, ED - Abnormal; Notable for the following components:   Glucose, Bld 337 (*)    Calcium, Ion 1.11 (*)    Hemoglobin 12.9 (*)    HCT 38.0 (*)    All other components within normal limits  TROPONIN I  BRAIN NATRIURETIC PEPTIDE  URINALYSIS, ROUTINE W REFLEX MICROSCOPIC  TROPONIN I  TROPONIN I  TROPONIN I  I-STAT TROPONIN, ED    EKG EKG Interpretation  Date/Time:  Saturday November 19 2018 07:53:49 EST Ventricular Rate:  86 PR Interval:    QRS Duration: 78 QT Interval:  362 QTC Calculation: 433 R Axis:   19 Text Interpretation:  Normal sinus rhythm ST elevation in v2 isolated no reciprocal depression Reconfirmed by Derwood Kaplan ([id number]) on 11/19/2018 8:11:53 AM   Radiology No results found.  Procedures Procedures (including critical care time)  Medications Ordered in ED Medications  aspirin chewable tablet 324 mg (has no administration in time range)     Initial Impression / Assessment and Plan / ED Course  I have reviewed the triage vital signs and the nursing notes.  Pertinent labs & imaging results that were available during my care of the patient were reviewed by me and considered in my medical decision making (see chart for details).    Patient comes in with multiple complaints.  His main complaint is back pain along with dizziness and weakness.  Patient states that the dizziness is intermittent and described as lightheadedness.  His neuro exam is nonfocal.  Differential diagnosis includes TIA, orthostatics. The other complaint he has that started around the same time was back pain.  Patient describes his back pain is  similar to his CAD pain.  He has known history of RCA stroke.  EKG shows no acute findings, there is subtle ST elevations in anterior leads without any reciprocal  depressions.  ACS is in the differential diagnosis for back pain, but also for these intermittent dizziness that he might be having.  Finally also complains of worsening leg swelling -which could be because of worsening shortness of breath.  Basic labs have been ordered to ensure there is no electrolyte abnormality or new AKI.  We will also rule out DKA in the process.  Delta troponin have been ordered.  It appears that patient will need further work-up on ACS rule out front.  I do not think patient had a stroke, given the symptoms are intermittent, however TIA is a possibility.  10:31 AM I spoke with cardiology.  They will come and assess the patient as consultants, and are comfortable with the patient staying at [facility] emergency room. I discussed the case with the hospitalist who will admit.  We discussed the subtle facial droop on the left side, it seems like they are ordering a CT head without contrast -I will try to follow-up on the results if the patient is still in the Er.  Final Clinical Impressions(s) / ED Diagnoses   Final diagnoses:  TIA (transient ischemic attack)  Angina pectoris [facility])    ED Discharge Orders    None       Derwood Kaplan, MD 11/19/18 1032

## 2018-11-19 NOTE — ED Notes (Signed)
Attempted to give report, nurse Irving Burton will need to call back for report, providing patient care.

## 2018-11-19 NOTE — Consult Note (Signed)
CONSULTATION NOTE   Patient Name: [name] [name] Date of Encounter: 11/19/2018 Cardiologist: Lance Muss, MD  Chief Complaint   Dizziness, back pain  Patient Profile   62 yo male with history of CAD (NSTEMI in 2015 - DES to RCA), LVEF 50-65%, DM2, HTN, HLD and peripheral neuropathy, presents with dizziness, vision changes and back pain which is apparently his "anginal equivalent".  HPI   [name] [name] is a 62 y.o. male patient of Dr. Eldridge Dace, who is being seen today for the evaluation of back pain at the request of Dr. Marland Mcalpine. He has a history of CAD (NSTEMI in 2015 - DES to RCA), LVEF 50-65%, DM2, HTN, HLD and peripheral neuropathy, presents with dizziness, vision changes and back pain which is apparently his "anginal equivalent". Last evening he wasn't feeling well - got up out of a chair and was dizzy. Sat back down and sounds like he dosed off, but he thinks he passed out. Awakened slumped over in the chair and had back pain between his shoulders. Denies shortness of breath, cough, left-sided chest pain, palpitations or associated symptoms. Presented to the ER for evaluation. EKG personally reviewed shows no ischemia. Troponins initially are negative. BNP is low. BG's are poorly controlled as is his blood pressure. He is quite anxious.  PMHx   Past Medical History:  Diagnosis Date  . Barrett's esophagus   . CAD (coronary artery disease)    a. NSTEMI 05/2014 - occluded RCA dominant proximal s/p asp-thrombectomy/DES to RCA, minimal LAD/LCx, EF 50% by cath, 65-70% by echo.  . Depression   . Diabetes type 2, uncontrolled (HCC)   . Former tobacco use   . GERD (gastroesophageal reflux disease)   . Hemorrhoids    hx of  . Hypercholesterolemia   . Hypertension   . MI (myocardial infarction) (HCC)   . Peripheral neuropathy     Past Surgical History:  Procedure Laterality Date  . LEFT HEART CATHETERIZATION WITH CORONARY ANGIOGRAM N/A 06/14/2014   Procedure: LEFT HEART  CATHETERIZATION WITH CORONARY ANGIOGRAM;  Surgeon: Corky Crafts, MD;  Location: [facility] CATH LAB;  Service: Cardiovascular;  Laterality: N/A;  . TONSILLECTOMY      FAMHx   Family History  Problem Relation Age of Onset  . Hypertension Mother   . Alzheimer's disease Mother   . Alcohol abuse Father   . Cancer Father 76       "Throat"  . Diabetes type II Brother   . Cancer Brother 68       throat cancer  . Heart disease Neg Hx     SOCHx    reports that he quit smoking about 19 years ago. He has a 0.75 pack-year smoking history. He has never used smokeless tobacco. He reports that he does not drink alcohol or use drugs.  Outpatient Medications   No current facility-administered medications on file prior to encounter.    Current Outpatient Medications on File Prior to Encounter  Medication Sig Dispense Refill  . furosemide (LASIX) 20 MG tablet Take 1 tablet (20 mg total) by mouth daily. 7 tablet 0  . gabapentin (NEURONTIN) 100 MG capsule TAKE 2 CAPSULES BY MOUTH EVERY MORNING AND AT BEDTIME (Patient taking differently: Take 100 mg by mouth 2 (two) times daily. TAKE 2 CAPSULES BY MOUTH EVERY MORNING AND AT BEDTIME) 360 capsule 0  . HYDROcodone-acetaminophen (NORCO) 7.5-325 MG tablet Take 1 tablet by mouth every 6 (six) hours as needed for moderate pain. 120 tablet 0  .  insulin aspart (NOVOLOG FLEXPEN) 100 UNIT/ML FlexPen INJECT 30 TO 35 UNITS SUBCUTANEOUSLY THREE TIMES DAILY (Patient taking differently: Inject 15-30 Units into the skin 3 (three) times daily with meals. INJECT 30 TO 35 UNITS SUBCUTANEOUSLY THREE TIMES DAILY) 30 pen 1  . LEVEMIR FLEXTOUCH 100 UNIT/ML Pen INJECT 45 UNITS UNDER THE SKIN EVERY MORNING AND 40 UNITS EVERY NIGHT AT BEDTIME (Patient taking differently: Inject 40-45 Units into the skin See admin instructions. INJECT 45 UNITS UNDER THE SKIN EVERY MORNING AND 40 UNITS EVERY NIGHT AT BEDTIME) 90 mL 1  . lisinopril (PRINIVIL,ZESTRIL) 2.5 MG tablet TAKE 1 TABLET(2.5  MG) BY MOUTH DAILY (Patient taking differently: Take 2.5 mg by mouth daily. ) 90 tablet 0  . metoprolol tartrate (LOPRESSOR) 25 MG tablet TAKE 1/2 TABLET(12.5 MG) BY MOUTH TWICE DAILY (Patient taking differently: Take 12.5 mg by mouth 2 (two) times daily. ) 90 tablet 3  . nitroGLYCERIN (NITROSTAT) 0.4 MG SL tablet Place 1 tablet (0.4 mg total) under the tongue every 5 (five) minutes as needed for chest pain (up to 3 doses). 25 tablet 0  . pantoprazole (PROTONIX) 40 MG tablet TAKE 1 TABLET BY MOUTH EVERY DAY 90 tablet 0  . prasugrel (EFFIENT) 10 MG TABS tablet TAKE 1 TABLET BY MOUTH DAILY (Patient taking differently: Take 10 mg by mouth daily. ) 90 tablet 0  . rosuvastatin (CRESTOR) 40 MG tablet TAKE 1 TABLET(40 MG) BY MOUTH DAILY (Patient taking differently: Take 40 mg by mouth daily. ) 90 tablet 0  . EQ ASPIRIN ADULT LOW DOSE 81 MG EC tablet TAKE ONE TABLET BY MOUTH ONCE DAILY (Patient not taking: Reported on 11/19/2018) 90 tablet 0  . ibuprofen (ADVIL,MOTRIN) 400 MG tablet Take 1 tablet (400 mg total) by mouth every 6 (six) hours as needed. (Patient not taking: Reported on 11/19/2018) 30 tablet 0    Inpatient Medications    Scheduled Meds:   Continuous Infusions:   PRN Meds:    ALLERGIES   No Known Allergies  ROS   Pertinent items noted in HPI and remainder of comprehensive ROS otherwise negative.  Vitals   Vitals:   11/19/18 0752 11/19/18 0939 11/19/18 0941  BP: (!) 158/88 (!) 163/93 (!) 173/89  Pulse: 85 89 92  Resp: 16 17 11   Temp: 97.9 F (36.6 C)    TempSrc: Oral    SpO2: 97% 98% 100%   No intake or output data in the 24 hours ending 11/19/18 1027 There were no vitals filed for this visit.  Physical Exam   General appearance: alert, mild distress and anxious Neck: no carotid bruit, no JVD and thyroid not enlarged, symmetric, no tenderness/mass/nodules Lungs: clear to auscultation bilaterally Heart: regular rate and rhythm, S1, S2 normal, no murmur, click, rub  or gallop Abdomen: soft, non-tender; bowel sounds normal; no masses,  no organomegaly Extremities: edema trace to 1+ bilaterally, TTP secondary to neuropathy Pulses: 2+ and symmetric Skin: Skin color, texture, turgor normal. No rashes or lesions Neurologic: Grossly normal Psych: Anxious  Labs   Results for orders placed or performed during the hospital encounter of 11/19/18 (from the past 48 hour(s))  CBC     Status: Abnormal   Collection Time: 11/19/18  8:19 AM  Result Value Ref Range   WBC 4.7 4.0 - 10.5 K/uL   RBC 4.52 4.22 - 5.81 MIL/uL   Hemoglobin 12.8 (L) 13.0 - 17.0 g/dL   HCT 16.139.4 09.639.0 - 04.552.0 %   MCV 87.2 80.0 - 100.0 fL  MCH 28.3 26.0 - 34.0 pg   MCHC 32.5 30.0 - 36.0 g/dL   RDW 54.2 70.6 - 23.7 %   Platelets 257 150 - 400 K/uL   nRBC 0.0 0.0 - 0.2 %    Comment: Performed at [facility], [address] [place], [place] [address]  Troponin I - ONCE - STAT     Status: None   Collection Time: 11/19/18  8:19 AM  Result Value Ref Range   Troponin I <0.03 <0.03 ng/mL    Comment: Performed at [facility], [address], [place], [place] [address]  Brain natriuretic peptide     Status: None   Collection Time: 11/19/18  8:19 AM  Result Value Ref Range   B Natriuretic Peptide 10.2 0.0 - 100.0 pg/mL    Comment: Performed at [facility], [address] [place], [place] [address]  CBG monitoring, ED     Status: Abnormal   Collection Time: 11/19/18  8:25 AM  Result Value Ref Range   Glucose-Capillary 298 (H) 70 - 99 mg/dL  I-stat troponin, ED     Status: None   Collection Time: 11/19/18  8:32 AM  Result Value Ref Range   Troponin i, poc 0.01 0.00 - 0.08 ng/mL   Comment 3            Comment: Due to the release kinetics of cTnI, a negative result within the first hours of the onset of symptoms does not rule out myocardial infarction with certainty. If myocardial infarction is still suspected, repeat the test at  appropriate intervals.   I-stat chem 8, ed     Status: Abnormal   Collection Time: 11/19/18  8:33 AM  Result Value Ref Range   Sodium 137 135 - 145 mmol/L   Potassium 4.1 3.5 - 5.1 mmol/L   Chloride 104 98 - 111 mmol/L   BUN 8 6 - 20 mg/dL   Creatinine, Ser 1.06 0.61 - 1.24 mg/dL   Glucose, Bld 269 (H) 70 - 99 mg/dL   Calcium, Ion 4.85 (L) 1.15 - 1.40 mmol/L   TCO2 25 22 - 32 mmol/L   Hemoglobin 12.9 (L) 13.0 - 17.0 g/dL   HCT 46.2 (L) 70.3 - 50.0 %    ECG   NSR at 86, no ischemic changes - Personally Reviewed  Telemetry   Sinus rhythm - Personally Reviewed  Radiology   No results found.  Cardiac Studies   N/A  Impression   Principal Problem:   Chest pain, atypical Active Problems:   DM (diabetes mellitus), type 2, uncontrolled (HCC)   Peripheral neuropathy   CAD (coronary artery disease)   Hyperlipidemia   Uncontrolled diabetes mellitus type 2 with peripheral artery disease (HCC)   Old myocardial infarction   Recommendation   1. Mr. Leaper is describing atypical chest pain, although he did have some residual moderate CAD at cath in 2015 and his symptoms are similar to his "anginal equivalent". I doubt he had a true syncopal event. His dizziness may be d/t hyperglycemia and uncontrolled hypertension. He is admitted to hospital medicine to resolve these issues. I would trend troponins and if negative, plan for lexiscan myoview at [facility] tomorrow. Will follow-up on these results.  Thanks for the consultation.  Time Spent Directly with Patient:  I have spent a total of 45 minutes with the patient reviewing hospital notes, telemetry, EKGs, labs and examining the patient as well as establishing an assessment and plan that was  discussed personally with the patient.  > 50% of time was spent in direct patient care.  Length of Stay:  LOS: 0 days   Chrystie Nose, MD, [facility], FACP  [place]  [facility]  Medical Director of the [facility] Lip[facility] &    Cardiovascular Risk Reduction Clinic Diplomate of the American Board of Clinical Lipidology Attending Cardiologist  Direct Dial: [phone]  Fax: [phone]  Website:  [email or web address] 11/19/2018, 10:27 AM

## 2018-11-19 NOTE — ED Triage Notes (Signed)
Pt complains of dizziness, lightheadedness, weakness since last night. Pt states he may have passed out last night. Pt states he has had ankle swelling and intermittent back pain x 1 week.

## 2018-11-19 NOTE — ED Notes (Signed)
Patient transported to MRI 

## 2018-11-19 NOTE — ED Notes (Addendum)
Pt returned from MRI, echo cardiogram to be performed then recontact MRI for possible MRI this afternoon with additional medication. Pt states he will need to be asleep in order to have the MRI, MRI aware and states he will need to be awake for exam.

## 2018-11-19 NOTE — H&P (Signed)
History and Physical    [name] [name] ZOX:[id number] DOB: [birth year] DOA: 11/19/2018  PCP: Sherren Mocha, MD   Patient coming from: Home  Chief Complaint: Dizziness and Back Pain  HPI: [name] [name] is a 62 y.o. male with medical history significant of GERD and Barrett's esophagus, CAD status post stenting, diabetes mellitus type 2 which is uncontrolled insulin-dependent, hypertension, hyperlipidemia, history of MI and peripheral neuropathy secondary to diabetes and former tobacco abuse as well as other comorbidities who presents with a chief complaint of dizziness and Lightheadedness along with back pain. He states Started feeling weird and felt sort of "out of body" at 6:00 pm and ate at 8:00 pm the dizziness then progressed and had tunnel vision. Went to get up and sat back down because of the dizziness. Was watching show on tv and started beginning and does not remember and woke up toward the end. More lightheaded now. Back pain started after the dizzinessand feels similar to prior MI. No appetite at this time. Discomfort in back and always thought his lungs and feels it is "deep down." and worsens and becomes more pronounced. Starts hurting and then eases but did not let up. Last night started getting similar discomfort at 2:00 AM and has eased up now. Legs and Feet swollen and painful and has been going on 1 week. Back is eased up but still a little dizzy. "Feels wobbly even laying down" and has been nauseous.  TRH was called to admit this patient for questionable syncope and dizziness associated with back pain rule out ACS.  ED Course: Basic blood work done is given aspirin 324 mg p.o. daily, cardiology is consulted for further evaluation recommendations.  Had troponins initiated and initial troponin was less than 0.03  Review of Systems: As per HPI otherwise 10 point review of systems negative.   Past Medical History:  Diagnosis Date  . Barrett's esophagus   . CAD (coronary artery disease)     a. NSTEMI 05/2014 - occluded RCA dominant proximal s/p asp-thrombectomy/DES to RCA, minimal LAD/LCx, EF 50% by cath, 65-70% by echo.  . Depression   . Diabetes type 2, uncontrolled (HCC)   . Former tobacco use   . GERD (gastroesophageal reflux disease)   . Hemorrhoids    hx of  . Hypercholesterolemia   . Hypertension   . MI (myocardial infarction) (HCC)   . Peripheral neuropathy    Past Surgical History:  Procedure Laterality Date  . LEFT HEART CATHETERIZATION WITH CORONARY ANGIOGRAM N/A 06/14/2014   Procedure: LEFT HEART CATHETERIZATION WITH CORONARY ANGIOGRAM;  Surgeon: Corky Crafts, MD;  Location: [facility] CATH LAB;  Service: Cardiovascular;  Laterality: N/A;  . TONSILLECTOMY     SOCIAL HISTORY  reports that he quit smoking about 19 years ago. He has a 0.75 pack-year smoking history. He has never used smokeless tobacco. He reports that he does not drink alcohol or use drugs.  ALLEGIES No Known Allergies  Family History  Problem Relation Age of Onset  . Hypertension Mother   . Alzheimer's disease Mother   . Alcohol abuse Father   . Cancer Father 86       "Throat"  . Diabetes type II Brother   . Cancer Brother 68       throat cancer  . Heart disease Neg Hx    Prior to Admission medications   Medication Sig Start Date End Date Taking? Authorizing Provider  furosemide (LASIX) 20 MG tablet Take 1 tablet (20 mg total)  by mouth daily. 11/12/18  Yes Eber Hong, MD  gabapentin (NEURONTIN) 100 MG capsule TAKE 2 CAPSULES BY MOUTH EVERY MORNING AND AT BEDTIME Patient taking differently: Take 100 mg by mouth 2 (two) times daily. TAKE 2 CAPSULES BY MOUTH EVERY MORNING AND AT BEDTIME 09/09/18  Yes Jones Bales, NP  HYDROcodone-acetaminophen (NORCO) 7.5-325 MG tablet Take 1 tablet by mouth every 6 (six) hours as needed for moderate pain. 10/03/18  Yes Jones Bales, NP  insulin aspart (NOVOLOG FLEXPEN) 100 UNIT/ML FlexPen INJECT 30 TO 35 UNITS SUBCUTANEOUSLY THREE TIMES  DAILY Patient taking differently: Inject 15-30 Units into the skin 3 (three) times daily with meals. INJECT 30 TO 35 UNITS SUBCUTANEOUSLY THREE TIMES DAILY 03/11/18  Yes Sherren Mocha, MD  LEVEMIR FLEXTOUCH 100 UNIT/ML Pen INJECT 45 UNITS UNDER THE SKIN EVERY MORNING AND 40 UNITS EVERY NIGHT AT BEDTIME Patient taking differently: Inject 40-45 Units into the skin See admin instructions. INJECT 45 UNITS UNDER THE SKIN EVERY MORNING AND 40 UNITS EVERY NIGHT AT BEDTIME 09/28/18  Yes Sherren Mocha, MD  lisinopril (PRINIVIL,ZESTRIL) 2.5 MG tablet TAKE 1 TABLET(2.5 MG) BY MOUTH DAILY Patient taking differently: Take 2.5 mg by mouth daily.  09/09/18  Yes Sherren Mocha, MD  metoprolol tartrate (LOPRESSOR) 25 MG tablet TAKE 1/2 TABLET(12.5 MG) BY MOUTH TWICE DAILY Patient taking differently: Take 12.5 mg by mouth 2 (two) times daily.  09/12/18  Yes Sherren Mocha, MD  nitroGLYCERIN (NITROSTAT) 0.4 MG SL tablet Place 1 tablet (0.4 mg total) under the tongue every 5 (five) minutes as needed for chest pain (up to 3 doses). 11/10/16  Yes Corky Crafts, MD  pantoprazole (PROTONIX) 40 MG tablet TAKE 1 TABLET BY MOUTH EVERY DAY 09/26/18  Yes Sherren Mocha, MD  prasugrel (EFFIENT) 10 MG TABS tablet TAKE 1 TABLET BY MOUTH DAILY Patient taking differently: Take 10 mg by mouth daily.  09/09/18  Yes Sherren Mocha, MD  rosuvastatin (CRESTOR) 40 MG tablet TAKE 1 TABLET(40 MG) BY MOUTH DAILY Patient taking differently: Take 40 mg by mouth daily.  09/09/18  Yes Sherren Mocha, MD  EQ ASPIRIN ADULT LOW DOSE 81 MG EC tablet TAKE ONE TABLET BY MOUTH ONCE DAILY Patient not taking: Reported on 11/19/2018 07/02/15   Corky Crafts, MD  ibuprofen (ADVIL,MOTRIN) 400 MG tablet Take 1 tablet (400 mg total) by mouth every 6 (six) hours as needed. Patient not taking: Reported on 11/19/2018 09/06/18   Lorre Nick, MD   Physical Exam: Vitals:   11/19/18 1230 11/19/18 1300 11/19/18 1330 11/19/18 1616  BP: (!) 172/83 (!) 168/83 (!) 151/82  (!) 148/82  Pulse: 99 99 98 96  Resp: 17 20 (!) 21 20  Temp:    97.9 F (36.6 C)  TempSrc:    Oral  SpO2: 98% 98% 100% 96%  Weight:    107.1 kg  Height:    5\' 9"  (1.753 m)   Constitutional: WN/WD Obese AAM in NAD and appears calm but uncomfortable Eyes: Lids and conjunctivae normal, sclerae anicteric  ENMT: External Ears, Nose appear normal. Grossly normal hearing. Mucous membranes are moist. Neck: Appears normal, supple, no cervical masses, normal ROM, no appreciable thyromegaly; no JVD Respiratory: Diminished to auscultation bilaterally, no wheezing, rales, rhonchi or crackles. Normal respiratory effort and patient is not tachypenic. No accessory muscle use.  Cardiovascular: RRR, no murmurs / rubs / gallops. S1 and S2 auscultated. 1+ LE extremity edema. 2+ pedal pulses. Abdomen: Soft, non-tender, Distended 2/2  body habitus. No masses palpated. No appreciable hepatosplenomegaly. Bowel sounds positive x4.  GU: Deferred. Musculoskeletal: No clubbing / cyanosis of digits/nails. No joint deformity upper and lower extremities.  Skin: No rashes, lesions, ulcers on a limited skin evaluation. No induration; Warm and dry.  Neurologic: CN 2-12 grossly intact with no focal deficits.  Romberg sign and cerebellar reflexes not assessed.  Psychiatric: Normal judgment and insight. Alert and oriented x 3. Normal mood and appropriate affect.   Labs on Admission: I have personally reviewed following labs and imaging studies  CBC: Recent Labs  Lab 11/19/18 0819 11/19/18 0833  WBC 4.7  --   HGB 12.8* 12.9*  HCT 39.4 38.0*  MCV 87.2  --   PLT 257  --    Basic Metabolic Panel: Recent Labs  Lab 11/19/18 0833  NA 137  K 4.1  CL 104  GLUCOSE 337*  BUN 8  CREATININE 0.80   GFR: Estimated Creatinine Clearance: 118.5 mL/min (by C-G formula based on SCr of 0.8 mg/dL). Liver Function Tests: No results for input(s): AST, ALT, ALKPHOS, BILITOT, PROT, ALBUMIN in the last 168 hours. No results for  input(s): LIPASE, AMYLASE in the last 168 hours. No results for input(s): AMMONIA in the last 168 hours. Coagulation Profile: No results for input(s): INR, PROTIME in the last 168 hours. Cardiac Enzymes: Recent Labs  Lab 11/19/18 0819 11/19/18 1118 11/19/18 1632  TROPONINI <0.03 <0.03 <0.03   BNP (last 3 results) No results for input(s): PROBNP in the last 8760 hours. HbA1C: No results for input(s): HGBA1C in the last 72 hours. CBG: Recent Labs  Lab 11/19/18 0825  GLUCAP 298*   Lipid Profile: No results for input(s): CHOL, HDL, LDLCALC, TRIG, CHOLHDL, LDLDIRECT in the last 72 hours. Thyroid Function Tests: No results for input(s): TSH, T4TOTAL, FREET4, T3FREE, THYROIDAB in the last 72 hours. Anemia Panel: No results for input(s): VITAMINB12, FOLATE, FERRITIN, TIBC, IRON, RETICCTPCT in the last 72 hours. Urine analysis:    Component Value Date/Time   COLORURINE STRAW (A) 11/19/2018 0755   APPEARANCEUR CLEAR 11/19/2018 0755   LABSPEC 1.017 11/19/2018 0755   PHURINE 5.0 11/19/2018 0755   GLUCOSEU >=500 (A) 11/19/2018 0755   HGBUR NEGATIVE 11/19/2018 0755   BILIRUBINUR NEGATIVE 11/19/2018 0755   BILIRUBINUR negative 03/11/2018 1613   KETONESUR 5 (A) 11/19/2018 0755   PROTEINUR NEGATIVE 11/19/2018 0755   UROBILINOGEN 0.2 03/11/2018 1613   UROBILINOGEN 0.2 06/14/2014 0809   NITRITE NEGATIVE 11/19/2018 0755   LEUKOCYTESUR NEGATIVE 11/19/2018 0755   Sepsis Labs: !!!!!!!!!!!!!!!!!!!!!!!!!!!!!!!!!!!!!!!!!!!! @LABRCNTIP (procalcitonin:4,lacticidven:4) )No results found for this or any previous visit (from the past 240 hour(s)).   Radiological Exams on Admission: Ct Head Wo Contrast  Result Date: 11/19/2018 CLINICAL DATA:  Syncopal episode last night, dizziness, history heart disease, type II diabetes mellitus, coronary artery disease post MI, hypertension, former smoker EXAM: CT HEAD WITHOUT CONTRAST TECHNIQUE: Contiguous axial images were obtained from the base of the skull  through the vertex without intravenous contrast. Sagittal and coronal MPR images reconstructed from axial data set. COMPARISON:  09/06/2018 FINDINGS: Brain: Normal ventricular morphology. No midline shift or mass effect. Normal appearance of brain parenchyma. No intracranial hemorrhage, mass lesion, evidence of acute infarction, or extra-axial fluid collection. Vascular: No hyperdense vessels Skull: Intact, unremarkable Sinuses/Orbits: Clear Other: N/A IMPRESSION: Normal exam. Electronically Signed   By: Ulyses Southward M.D.   On: 11/19/2018 11:53   Mr Laqueta Jean ZO Contrast  Result Date: 11/19/2018 CLINICAL DATA:  Dizziness. Lightheaded. Weakness beginning last  night EXAM: MRI HEAD WITHOUT AND WITH CONTRAST TECHNIQUE: Multiplanar, multiecho pulse sequences of the brain and surrounding structures were obtained without and with intravenous contrast. CONTRAST:  10 cc Gadavist COMPARISON:  Head CT same day. FINDINGS: Brain: Mild age related volume loss. No evidence of old or acute small or large vessel infarction, mass lesion, hemorrhage, hydrocephalus or extra-axial collection. Vascular: Major vessels at the base of the brain show flow. Skull and upper cervical spine: Negative Sinuses/Orbits: Clear/normal Other: None IMPRESSION: Mild age related volume loss.  No acute or focal finding. Electronically Signed   By: Paulina Fusi M.D.   On: 11/19/2018 17:29    EKG: Independently reviewed. Showed a Normal Sinus Rhythm at a rate of 86 with minimal ST elevation in lead V2. QTc was 433.  Assessment/Plan Principal Problem:   Chest pain, atypical Active Problems:   DM (diabetes mellitus), type 2, uncontrolled (HCC)   Peripheral neuropathy   CAD (coronary artery disease)   Hyperlipidemia   Uncontrolled diabetes mellitus type 2 with peripheral artery disease (HCC)   Old myocardial infarction   Dizziness  Dizziness and Light Headedness -Observation telemetry -Check orthostatic vital signs and start IV fluid  hydration -Obtain head CT scan and after discussion with neurology will also recommend getting a MRI of the brain -Head CT w/o Contrast showed no acute intracranial abnormalities and was a normal examination. -MRI of the brain showed Mild age related volume loss.  No acute or focal finding. -Check cardiac troponins and cycle them -Check echocardiogram and also check carotid Dopplers -We will obtain PT and OT evaluation -May require TED hose -Unclear etiology of his dizziness and lightheadedness but may be due to his high blood pressure and so we will restart his home antihypertensives -Cardiology consulted for further evaluation -Follow ECHOCardiogram  Back pain/Atypcial CP with similar presentation to the his MI in the past/CAD status post stenting with right RCA MI -Placed on telemetry -Cycle cardiac troponins and check echocardiogram -Cardiology consulted for further evaluation recommendations appreciate Dr. Blanchie Dessert evaluation -If troponins were negative plan is for Lexiscan Myoview at [facility] tomorrow per cardiology -Troponins are less than 0.03 3 -Follow-up on cardiology recommendations in the a.m. -Continue with home nitroglycerin 0.4 mg liquid every 5 minutes as needed chest pain, metoprolol 12.5 mg p.o. twice daily, and Prasugrel -Continue and was given aspirin 324 mg p.o. daily -Continue with lisinopril 2.5 mg daily and rosuvastatin 40 mg p.o. daily  Uncontrolled Diabetes Mellitus Type 2 and located by diabetic peripheral neuropathy -Continue with home Levemir with 40 units daily nightly and 45 units every morning -Place on a cardiac and diabetic diet -Placed on a moderate NovoLog/scale insulin before meals and at bedtime-continue to monitor CBGs -Consult diabetes education coordinator for further blood sugar teaching and management -Home gabapentin  Hypertension -Patient blood pressure on admission was 173/89 is now 148/82 -New home antihypertensives including lisinopril,  metoprolol -Hold home furosemide as patient is currently getting IV fluid resuscitation currently  Hyperlipidemia -Continue rosuvastatin 40 mg p.o. nightly  GERD and Barrett's esophagus -Continue with pantoprazole 40 mg p.o. daily  Back Pain -Continue with pain control -See as above  Normocytic Anemia -Patient's hemoglobin/hematocrit was 12.9/30.0 -Continue monitor for signs and symptoms of bleeding -Check anemia panel in a.m. -Repeat CBC in a.m.  Obesity -Estimated body mass index is 34.87 kg/m as calculated from the following:   Height as of this encounter: 5\' 9"  (1.753 m).   Weight as of this encounter: 107.1 kg. -Weight Loss  Counseling given  DVT prophylaxis: Enoxaparin 40 mg subcu every 24 Code Status: FULL CODE Family Communication: No family present at bedside Disposition Plan: Anticipate discharge home in the next 24 to 48 hours if medically stable and cleared by cardiology and dizziness is improved Consults called: Cardiology. Discussed Case with Neurology Dr. Laurence SlateAroor Admission status: Observation telemetry  Severity of Illness: The appropriate patient status for this patient is OBSERVATION. Observation status is judged to be reasonable and necessary in order to provide the required intensity of service to ensure the patient's safety. The patient's presenting symptoms, physical exam findings, and initial radiographic and laboratory data in the context of their medical condition is felt to place them at decreased risk for further clinical deterioration. Furthermore, it is anticipated that the patient will be medically stable for discharge from the hospital within 2 midnights of admission. The following factors support the patient status of observation.   " The patient's presenting symptoms include Dizziness and Back Pain. " The physical exam findings include Mild Leg swelling. " The initial radiographic and laboratory data are Reassuring.   Merlene Laughtermair Latif Sheikh,  D.O. [facility] PAGER is on AMION  If 7PM-7AM, please contact night-coverage [email or web address] Password [email or web address]  11/19/2018, 6:46 PM

## 2018-11-19 NOTE — Progress Notes (Signed)
  Echocardiogram 2D Echocardiogram has been performed.  Francess Mullen L Androw 11/19/2018, 2:58 PM

## 2018-11-19 NOTE — ED Notes (Signed)
MRI called, pt unable to complete MRI exam due to anxiety r/t claustrophobia. MD notified.

## 2018-11-20 ENCOUNTER — Observation Stay (HOSPITAL_COMMUNITY): Payer: Medicare HMO

## 2018-11-20 ENCOUNTER — Observation Stay (HOSPITAL_BASED_OUTPATIENT_CLINIC_OR_DEPARTMENT_OTHER): Payer: Medicare HMO

## 2018-11-20 ENCOUNTER — Encounter (HOSPITAL_COMMUNITY): Payer: Self-pay

## 2018-11-20 ENCOUNTER — Other Ambulatory Visit (HOSPITAL_COMMUNITY): Payer: Medicare HMO

## 2018-11-20 DIAGNOSIS — M7989 Other specified soft tissue disorders: Secondary | ICD-10-CM

## 2018-11-20 DIAGNOSIS — E1165 Type 2 diabetes mellitus with hyperglycemia: Secondary | ICD-10-CM | POA: Diagnosis not present

## 2018-11-20 DIAGNOSIS — I252 Old myocardial infarction: Secondary | ICD-10-CM | POA: Diagnosis not present

## 2018-11-20 DIAGNOSIS — R0789 Other chest pain: Secondary | ICD-10-CM | POA: Diagnosis not present

## 2018-11-20 DIAGNOSIS — I2510 Atherosclerotic heart disease of native coronary artery without angina pectoris: Secondary | ICD-10-CM | POA: Diagnosis not present

## 2018-11-20 DIAGNOSIS — R42 Dizziness and giddiness: Secondary | ICD-10-CM | POA: Diagnosis not present

## 2018-11-20 DIAGNOSIS — R569 Unspecified convulsions: Secondary | ICD-10-CM

## 2018-11-20 DIAGNOSIS — E782 Mixed hyperlipidemia: Secondary | ICD-10-CM | POA: Diagnosis not present

## 2018-11-20 DIAGNOSIS — G459 Transient cerebral ischemic attack, unspecified: Secondary | ICD-10-CM

## 2018-11-20 DIAGNOSIS — R55 Syncope and collapse: Secondary | ICD-10-CM | POA: Diagnosis not present

## 2018-11-20 DIAGNOSIS — G63 Polyneuropathy in diseases classified elsewhere: Secondary | ICD-10-CM | POA: Diagnosis not present

## 2018-11-20 LAB — LIPID PANEL
Cholesterol: 144 mg/dL (ref 0–200)
HDL: 41 mg/dL (ref >40)
LDL Cholesterol: 68 mg/dL (ref 0–99)
Total CHOL/HDL Ratio: 3.5 RATIO
Triglycerides: 173 mg/dL — ABNORMAL HIGH (ref <150)
VLDL: 35 mg/dL (ref 0–40)

## 2018-11-20 LAB — GLUCOSE, CAPILLARY
GLUCOSE-CAPILLARY: 197 mg/dL — AB (ref 70–99)
GLUCOSE-CAPILLARY: 88 mg/dL (ref 70–99)
GLUCOSE-CAPILLARY: 90 mg/dL (ref 70–99)
Glucose-Capillary: 520 mg/dL (ref 70–99)
Glucose-Capillary: 230 mg/dL — ABNORMAL HIGH (ref 70–99)
Glucose-Capillary: 77 mg/dL (ref 70–99)
Glucose-Capillary: 130 mg/dL — ABNORMAL HIGH (ref 70–99)
Glucose-Capillary: 77 mg/dL (ref 70–99)
Glucose-Capillary: 34 mg/dL — CL (ref 70–99)
Glucose-Capillary: 89 mg/dL (ref 70–99)

## 2018-11-20 LAB — CBC
HCT: 38.1 % — ABNORMAL LOW (ref 39.0–52.0)
Hemoglobin: 12.3 g/dL — ABNORMAL LOW (ref 13.0–17.0)
MCH: 28.5 pg (ref 26.0–34.0)
MCHC: 32.3 g/dL (ref 30.0–36.0)
MCV: 88.2 fL (ref 80.0–100.0)
Platelets: 287 10*3/uL (ref 150–400)
RBC: 4.32 MIL/uL (ref 4.22–5.81)
RDW: 13.1 % (ref 11.5–15.5)
WBC: 6.5 10*3/uL (ref 4.0–10.5)
nRBC: 0 % (ref 0.0–0.2)

## 2018-11-20 LAB — HEMOGLOBIN A1C
HEMOGLOBIN A1C: 8.2 % — AB (ref 4.8–5.6)
Mean Plasma Glucose: 188.64 mg/dL

## 2018-11-20 LAB — BASIC METABOLIC PANEL
Anion gap: 12 (ref 5–15)
BUN: 18 mg/dL (ref 6–20)
CO2: 21 mmol/L — ABNORMAL LOW (ref 22–32)
Calcium: 8.6 mg/dL — ABNORMAL LOW (ref 8.9–10.3)
Chloride: 103 mmol/L (ref 98–111)
Creatinine, Ser: 1.24 mg/dL (ref 0.61–1.24)
Glucose, Bld: 227 mg/dL — ABNORMAL HIGH (ref 70–99)
Potassium: 4.2 mmol/L (ref 3.5–5.1)
Sodium: 136 mmol/L (ref 135–145)

## 2018-11-20 LAB — HIV ANTIBODY (ROUTINE TESTING W REFLEX): HIV Screen 4th Generation wRfx: NONREACTIVE

## 2018-11-20 MED ORDER — IOPAMIDOL (ISOVUE-370) INJECTION 76%
100.0000 mL | Freq: Once | INTRAVENOUS | Status: AC | PRN
  Administered 2018-11-20: 80 mL via INTRAVENOUS

## 2018-11-20 MED ORDER — ASPIRIN 325 MG PO TABS
325.0000 mg | ORAL_TABLET | Freq: Every day | ORAL | Status: DC
  Administered 2018-11-20 – 2018-11-22 (×3): 325 mg via ORAL
  Filled 2018-11-20 (×3): qty 1

## 2018-11-20 MED ORDER — SODIUM CHLORIDE 0.9 % IV SOLN
INTRAVENOUS | Status: DC
  Administered 2018-11-20 – 2018-11-21 (×2): via INTRAVENOUS

## 2018-11-20 MED ORDER — IOPAMIDOL (ISOVUE-370) INJECTION 76%
INTRAVENOUS | Status: AC
  Filled 2018-11-20: qty 100

## 2018-11-20 MED ORDER — DEXTROSE 50 % IV SOLN
1.0000 | Freq: Once | INTRAVENOUS | Status: AC
  Administered 2018-11-20: 50 mL via INTRAVENOUS
  Filled 2018-11-20: qty 50

## 2018-11-20 MED ORDER — INSULIN ASPART 100 UNIT/ML ~~LOC~~ SOLN
10.0000 [IU] | Freq: Once | SUBCUTANEOUS | Status: AC
  Administered 2018-11-20: 10 [IU] via SUBCUTANEOUS

## 2018-11-20 MED ORDER — SODIUM CHLORIDE (PF) 0.9 % IJ SOLN
INTRAMUSCULAR | Status: AC
  Filled 2018-11-20: qty 50

## 2018-11-20 NOTE — Progress Notes (Signed)
2nd NIH completed at 1439 result was 4 compared to prior result of 1 4 hours earlier. Pt did appear more drowsy. Notified RN of results and made suggestions to recheck CBG and notify MD of results of CBG and NIH. RN to call rapid back if needed before 4 hour recheck of NIH.

## 2018-11-20 NOTE — Progress Notes (Addendum)
PROGRESS NOTE    [name] DamesKevin J [name]  ZOX:[id number] DOB: 1956-11-09 DOA: 11/19/2018 PCP: Sherren MochaShaw, Eva N, MD   Brief Narrative: 62 year old male with past medical history significant for GERD, Barrett's esophagus, coronary artery disease a status post a stent, diabetes type 2 uncontrolled, hypertension, peripheral neuropathy who presents complaining of dizziness and lightheadedness along with back pain.  Assessment & Plan:   Principal Problem:   Chest pain, atypical Active Problems:   DM (diabetes mellitus), type 2, uncontrolled (HCC)   Peripheral neuropathy   CAD (coronary artery disease)   Hyperlipidemia   Uncontrolled diabetes mellitus type 2 with peripheral artery disease (HCC)   Old myocardial infarction   Dizziness  1-Dizziness lightheaded; MRI was negative.  Continue with IV fluids.  Orthostatic negative.   2-facial  droop;  NIH at 4.  He is alert, speech was clear, speaks slow. He was able to name different objects, cup, cell phone.  Right facial droop. Generalized weakness, no focal on strenghn.  cbg was 70. Gave amp D 50/  Repeat CT head. Discussed with neurology, [facility] Will get CT angio head.  Continue to monitor.  Start Aspirin.  Addendum 4;54 patient right facial droop significantly decrease, face is more symmetric.   3-Chest pain; back pain.  Ate breakfast. Could have stress test.  Enzymes negative.  Per cardiology, stress test out patient if unable to do it today/   4-DM; cbg 70. Hold levemir to night/.  Continue with levemir AM dose.   HTN;  Continue with lisinopril, metoprolol.   Hyperlipidemia -Continue rosuvastatin 40 mg p.o. nightly  GERD and Barrett's esophagus -Continue with pantoprazole 40 mg p.o. daily  Obesity -Estimated body mass index is 34.87 kg/m as calculated from the following:   Height as of this encounter: 5\' 9"  (1.753 m).   Weight as of this encounter: 107.1 kg. -Weight Loss Counseling given  RN Pressure Injury  Documentation:    Malnutrition Type:      Malnutrition Characteristics:      Nutrition Interventions:     Estimated body mass index is 34.87 kg/m as calculated from the following:   Height as of this encounter: 5\' 9"  (1.753 m).   Weight as of this encounter: 107.1 kg.   DVT prophylaxis: lovenox.  Code Status: full code.  Family Communication: care discussed with patient.  Disposition Plan: transfer to [place]   Consultants:   Neurology  Cardiology   Procedures: doppler;  Echo  Antimicrobials: none  Subjective: Feels drowsy, right facial droop. Speaking slowly.  Blurry vision.   Objective: Vitals:   11/19/18 2105 11/20/18 0458 11/20/18 0927 11/20/18 1324  BP: (!) 159/79 122/62 127/65 (!) 141/73  Pulse: (!) 105 88 90 88  Resp: 18 18  18   Temp: 97.8 F (36.6 C) 97.8 F (36.6 C)  99.2 F (37.3 C)  TempSrc: Oral Oral  Oral  SpO2: 97% 97%  96%  Weight:      Height:        Intake/Output Summary (Last 24 hours) at 11/20/2018 1532 Last data filed at 11/20/2018 1414 Gross per 24 hour  Intake 1145.11 ml  Output 1350 ml  Net -204.89 ml   Filed Weights   11/19/18 1616  Weight: 107.1 kg    Examination:  General exam: Appears calm and comfortable  Respiratory system: Clear to auscultation. Respiratory effort normal. Cardiovascular system: S1 & S2 heard, RRR. No JVD, murmurs, rubs, gallops or clicks. No pedal edema. Gastrointestinal system: Abdomen is nondistended, soft  and nontender. No organomegaly or masses felt. Normal bowel sounds heard. Central nervous system: Alert and oriented. Speech slow, right facial droop, tongue midline, motor streghn BL 4-5/5 Left LE with pain limit movement. Right 5/5/ Extremities: Symmetric 5 x 5 power. Skin: No rashes, lesions or ulcers Psychiatry: Judgement and insight appear normal. Mood & affect appropriate.     Data Reviewed: I have personally reviewed following labs and imaging studies  CBC: Recent Labs   Lab 11/19/18 0819 11/19/18 0833 11/20/18 0741  WBC 4.7  --  6.5  HGB 12.8* 12.9* 12.3*  HCT 39.4 38.0* 38.1*  MCV 87.2  --  88.2  PLT 257  --  287   Basic Metabolic Panel: Recent Labs  Lab 11/19/18 0833 11/20/18 0741  NA 137 136  K 4.1 4.2  CL 104 103  CO2  --  21*  GLUCOSE 337* 227*  BUN 8 18  CREATININE 0.80 1.24  CALCIUM  --  8.6*   GFR: Estimated Creatinine Clearance: 76.4 mL/min (by C-G formula based on SCr of 1.24 mg/dL). Liver Function Tests: No results for input(s): AST, ALT, ALKPHOS, BILITOT, PROT, ALBUMIN in the last 168 hours. No results for input(s): LIPASE, AMYLASE in the last 168 hours. No results for input(s): AMMONIA in the last 168 hours. Coagulation Profile: No results for input(s): INR, PROTIME in the last 168 hours. Cardiac Enzymes: Recent Labs  Lab 11/19/18 0819 11/19/18 1118 11/19/18 1632 11/19/18 1826  TROPONINI <0.03 <0.03 <0.03 <0.03   BNP (last 3 results) No results for input(s): PROBNP in the last 8760 hours. HbA1C: Recent Labs    11/20/18 0455  HGBA1C 8.2*   CBG: Recent Labs  Lab 11/19/18 2353 11/20/18 0338 11/20/18 0731 11/20/18 1227 11/20/18 1458  GLUCAP 481* 230* 197* 88 77   Lipid Profile: Recent Labs    11/20/18 0455  CHOL 144  HDL 41  LDLCALC 68  TRIG 173*  CHOLHDL 3.5   Thyroid Function Tests: No results for input(s): TSH, T4TOTAL, FREET4, T3FREE, THYROIDAB in the last 72 hours. Anemia Panel: No results for input(s): VITAMINB12, FOLATE, FERRITIN, TIBC, IRON, RETICCTPCT in the last 72 hours. Sepsis Labs: No results for input(s): PROCALCITON, LATICACIDVEN in the last 168 hours.  No results found for this or any previous visit (from the past 240 hour(s)).       Radiology Studies: Ct Head Wo Contrast  Result Date: 11/19/2018 CLINICAL DATA:  Syncopal episode last night, dizziness, history heart disease, type II diabetes mellitus, coronary artery disease post MI, hypertension, former smoker EXAM: CT  HEAD WITHOUT CONTRAST TECHNIQUE: Contiguous axial images were obtained from the base of the skull through the vertex without intravenous contrast. Sagittal and coronal MPR images reconstructed from axial data set. COMPARISON:  09/06/2018 FINDINGS: Brain: Normal ventricular morphology. No midline shift or mass effect. Normal appearance of brain parenchyma. No intracranial hemorrhage, mass lesion, evidence of acute infarction, or extra-axial fluid collection. Vascular: No hyperdense vessels Skull: Intact, unremarkable Sinuses/Orbits: Clear Other: N/A IMPRESSION: Normal exam. Electronically Signed   By: Ulyses Southward M.D.   On: 11/19/2018 11:53   Mr Laqueta Jean HF Contrast  Result Date: 11/19/2018 CLINICAL DATA:  Dizziness. Lightheaded. Weakness beginning last night EXAM: MRI HEAD WITHOUT AND WITH CONTRAST TECHNIQUE: Multiplanar, multiecho pulse sequences of the brain and surrounding structures were obtained without and with intravenous contrast. CONTRAST:  10 cc Gadavist COMPARISON:  Head CT same day. FINDINGS: Brain: Mild age related volume loss. No evidence of old or acute small or  large vessel infarction, mass lesion, hemorrhage, hydrocephalus or extra-axial collection. Vascular: Major vessels at the base of the brain show flow. Skull and upper cervical spine: Negative Sinuses/Orbits: Clear/normal Other: None IMPRESSION: Mild age related volume loss.  No acute or focal finding. Electronically Signed   By: Paulina Fusi M.D.   On: 11/19/2018 17:29   Vas US Carotid (at [facility])  Result Date: 11/20/2018 Carotid Arterial Duplex Study Indications: Syncope. Performing Technologist: Chanda Busing RVT  Examination Guidelines: A complete evaluation includes B-mode imaging, spectral Doppler, color Doppler, and power Doppler as needed of all accessible portions of each vessel. Bilateral testing is considered an integral part of a complete examination. Limited examinations for reoccurring indications may be  performed as noted.  Right Carotid Findings: +----------+--------+--------+--------+-----------------------+--------+           PSV cm/sEDV cm/sStenosisDescribe               Comments +----------+--------+--------+--------+-----------------------+--------+ CCA Prox  132     17              smooth and heterogenous         +----------+--------+--------+--------+-----------------------+--------+ CCA Distal118     24              smooth and heterogenous         +----------+--------+--------+--------+-----------------------+--------+ ICA Prox  107     13                                              +----------+--------+--------+--------+-----------------------+--------+ ICA Distal87      27                                     tortuous +----------+--------+--------+--------+-----------------------+--------+ ECA       157     17                                              +----------+--------+--------+--------+-----------------------+--------+ +----------+--------+-------+--------+-------------------+           PSV cm/sEDV cmsDescribeArm Pressure (mmHG) +----------+--------+-------+--------+-------------------+ [id number]                                        +----------+--------+-------+--------+-------------------+ +---------+--------+--+--------+--+---------+ VertebralPSV cm/s64EDV cm/s13Antegrade +---------+--------+--+--------+--+---------+  Left Carotid Findings: +----------+--------+--------+--------+-----------------------+--------+           PSV cm/sEDV cm/sStenosisDescribe               Comments +----------+--------+--------+--------+-----------------------+--------+ CCA Prox  154     16              smooth and heterogenous         +----------+--------+--------+--------+-----------------------+--------+ CCA Distal122     21              smooth and heterogenous          +----------+--------+--------+--------+-----------------------+--------+ ICA Prox  117     20              smooth and heterogenous         +----------+--------+--------+--------+-----------------------+--------+ ICA Distal102     27                                              +----------+--------+--------+--------+-----------------------+--------+  ECA       129     10                                              +----------+--------+--------+--------+-----------------------+--------+ +----------+--------+--------+--------+-------------------+ SubclavianPSV cm/sEDV cm/sDescribeArm Pressure (mmHG) +----------+--------+--------+--------+-------------------+           172                                         +----------+--------+--------+--------+-------------------+ +---------+--------+--+--------+--+---------+ VertebralPSV cm/s69EDV cm/s13Antegrade +---------+--------+--+--------+--+---------+  Summary: Right Carotid: Velocities in the right ICA are consistent with a 1-39% stenosis. Left Carotid: Velocities in the left ICA are consistent with a 1-39% stenosis. Vertebrals: Bilateral vertebral arteries demonstrate antegrade flow. *See table(s) above for measurements and observations.  Electronically signed by Sherald Hess MD on 11/20/2018 at 10:42:12 AM.    Final         Scheduled Meds: . enoxaparin (LOVENOX) injection  40 mg Subcutaneous Q24H  . gabapentin  100 mg Oral BID  . insulin aspart  0-15 Units Subcutaneous TID WC  . insulin aspart  0-5 Units Subcutaneous QHS  . insulin detemir  45 Units Subcutaneous q morning - 10a  . lisinopril  2.5 mg Oral Daily  . metoprolol tartrate  12.5 mg Oral BID  . pantoprazole  40 mg Oral Daily  . prasugrel  10 mg Oral Daily  . rosuvastatin  40 mg Oral Daily   Continuous Infusions:   LOS: 0 days    Time spent: 35 minutes.     Alba Cory, MD [facility] Pager [phone]  If 7PM-7AM, please  contact night-coverage [email or web address] Password [facility] 11/20/2018, 3:32 PM

## 2018-11-20 NOTE — Consult Note (Addendum)
Neurology Consultation  Reason for Consult: Intermittent left facial weakness Referring Physician: Dr. Hartley Barefoot  CC: Intermittent left-sided facial weakness  History is obtained from: Patient, chart review  HPI: [name] [name] is a 62 y.o. male past medical history of CAD status post stenting, diabetes, hypertension, presented to the emergency room at [facility] 11/19/2018 for evaluation of dizziness and back pain. He says that he has a history of back pain for many many years and since his cardiac pain was also more of back pain but the more bothersome symptoms that he has right now are dizziness.  He was watching TV and had some abnormal sensation of out of body experience with some dizziness and double vision. He says that similar episodes of happened sometimes in the past.  He also has had multiple episodes of left arm shaking that is uncontrollable over the past 1 year. MRI of the brain was done-negative for stroke CTA head and neck done-normal Today was noted to have left-sided facial weakness that improved on its own in a little while. He was transferred to [facility] for further evaluation for possible stroke He says that he has been nauseous off and on as well. Denies any frank generalized tonic-clonic seizure activity.  Does report some loss of time with some of these episodes with almost what sounds like an aura preceding the seizure.    LKW: Greater than 24 hours ago tpa given?: no, likely not a stroke Premorbid modified Rankin scale (mRS): 0  ROS: ROS was performed and is negative except as noted in the HPI.    Past Medical History:  Diagnosis Date  . Barrett's esophagus   . CAD (coronary artery disease)    a. NSTEMI 05/2014 - occluded RCA dominant proximal s/p asp-thrombectomy/DES to RCA, minimal LAD/LCx, EF 50% by cath, 65-70% by echo.  . Depression   . Diabetes type 2, uncontrolled (HCC)   . Former tobacco use   . GERD (gastroesophageal reflux  disease)   . Hemorrhoids    hx of  . Hypercholesterolemia   . Hypertension   . MI (myocardial infarction) (HCC)   . Peripheral neuropathy    Family History  Problem Relation Age of Onset  . Hypertension Mother   . Alzheimer's disease Mother   . Alcohol abuse Father   . Cancer Father 68       "Throat"  . Diabetes type II Brother   . Cancer Brother 68       throat cancer  . Heart disease Neg Hx    Social History:   reports that he quit smoking about 19 years ago. He has a 0.75 pack-year smoking history. He has never used smokeless tobacco. He reports that he does not drink alcohol or use drugs.  Medications  Current Facility-Administered Medications:  .  0.9 %  sodium chloride infusion, , Intravenous, Continuous, Regalado, Belkys A, MD, Last Rate: 75 mL/hr at 11/20/18 1632 .  acetaminophen (TYLENOL) tablet 650 mg, 650 mg, Oral, Q4H PRN, Regalado, Belkys A, MD .  aspirin tablet 325 mg, 325 mg, Oral, Daily, Regalado, Belkys A, MD, 325 mg at 11/20/18 1704 .  enoxaparin (LOVENOX) injection 40 mg, 40 mg, Subcutaneous, Q24H, Regalado, Belkys A, MD, 40 mg at 11/19/18 2128 .  gabapentin (NEURONTIN) capsule 100 mg, 100 mg, Oral, BID, Regalado, Belkys A, MD, 100 mg at 11/20/18 0926 .  HYDROcodone-acetaminophen (NORCO) 7.5-325 MG per tablet 1 tablet, 1 tablet, Oral, Q6H PRN, Regalado, Belkys A, MD, 1  tablet at 11/20/18 1638 .  insulin aspart (novoLOG) injection 0-15 Units, 0-15 Units, Subcutaneous, TID WC, Regalado, Belkys A, MD, 3 Units at 11/20/18 0757 .  insulin aspart (novoLOG) injection 0-5 Units, 0-5 Units, Subcutaneous, QHS, Regalado, Belkys A, MD .  insulin detemir (LEVEMIR) injection 45 Units, 45 Units, Subcutaneous, q morning - 10a, Regalado, Belkys A, MD, 45 Units at 11/20/18 1110 .  iopamidol (ISOVUE-370) 76 % injection, , , ,  .  lisinopril (PRINIVIL,ZESTRIL) tablet 2.5 mg, 2.5 mg, Oral, Daily, Regalado, Belkys A, MD, 2.5 mg at 11/20/18 0928 .  metoprolol tartrate (LOPRESSOR)  tablet 12.5 mg, 12.5 mg, Oral, BID, Regalado, Belkys A, MD, 12.5 mg at 11/20/18 0928 .  nitroGLYCERIN (NITROSTAT) SL tablet 0.4 mg, 0.4 mg, Sublingual, Q5 min PRN, Regalado, Belkys A, MD .  ondansetron (ZOFRAN) injection 4 mg, 4 mg, Intravenous, Q6H PRN, Regalado, Belkys A, MD .  pantoprazole (PROTONIX) EC tablet 40 mg, 40 mg, Oral, Daily, Regalado, Belkys A, MD, 40 mg at 11/20/18 0932 .  rosuvastatin (CRESTOR) tablet 40 mg, 40 mg, Oral, Daily, Regalado, Belkys A, MD, 40 mg at 11/20/18 0927 .  sodium chloride (PF) 0.9 % injection, , , ,   Exam: Current vital signs: BP (!) 143/72 (BP Location: Left Arm)   Pulse 97   Temp 99.2 F (37.3 C) (Oral)   Resp 18   Ht 5\' 9"  (1.753 m)   Wt 107.1 kg   SpO2 96%   BMI 34.87 kg/m  Vital signs in last 24 hours: Temp:  [97.8 F (36.6 C)-99.2 F (37.3 C)] 99.2 F (37.3 C) (12/22 1324) Pulse Rate:  [88-105] 97 (12/22 1513) Resp:  [18] 18 (12/22 1324) BP: (122-159)/(62-79) 143/72 (12/22 1513) SpO2:  [96 %-97 %] 96 % (12/22 1324)  GENERAL: Awake, alert in NAD HEENT: - Normocephalic and atraumatic, dry mm, no LN++, no Thyromegally LUNGS - Clear to auscultation bilaterally with no wheezes CV - S1S2 RRR, no m/r/g, equal pulses bilaterally. ABDOMEN - Soft, nontender, nondistended with normoactive BS Ext: warm, well perfused, intact peripheral pulses, 1+ edema  NEURO:  Mental Status: AA&Ox3  Language: speech is non-dysarthric.  Naming, repetition, fluency, and comprehension intact. Cranial Nerves: PERRL EOMI, visual fields full, no facial asymmetry,facial sensation mildly reduced on the left cheek compared to the right, hearing intact, tongue/uvula/soft palate midline, normal sternocleidomastoid and trapezius muscle strength. No evidence of tongue atrophy or fibrillations Motor: 4+/5 left upper extremity, otherwise 5/5 with no vertical drift in any of the 4 extremities. Tone: is normal and bulk is normal Sensation- Intact to light touch  bilaterally Coordination: FTN intact bilaterally, no ataxia in BLE. Gait- deferred DTRs blunted in all 4 extremities  NIHSS-1 for sensory on face   Labs I have reviewed labs in epic and the results pertinent to this consultation are:  CBC    Component Value Date/Time   WBC 6.5 11/20/2018 0741   RBC 4.32 11/20/2018 0741   HGB 12.3 (L) 11/20/2018 0741   HGB 13.5 06/25/2017 1429   HCT 38.1 (L) 11/20/2018 0741   HCT 41.1 06/25/2017 1429   PLT 287 11/20/2018 0741   PLT 323 06/25/2017 1429   MCV 88.2 11/20/2018 0741   MCV 85.0 03/11/2018 1635   MCV 87 06/25/2017 1429   MCH 28.5 11/20/2018 0741   MCHC 32.3 11/20/2018 0741   RDW 13.1 11/20/2018 0741   RDW 14.7 06/25/2017 1429   LYMPHSABS 2.1 09/06/2018 1508   LYMPHSABS 1.9 06/25/2017 1429   MONOABS 0.3 09/06/2018  1508   EOSABS 0.3 09/06/2018 1508   EOSABS 0.2 06/25/2017 1429   BASOSABS 0.1 09/06/2018 1508   BASOSABS 0.0 06/25/2017 1429    CMP     Component Value Date/Time   NA 136 11/20/2018 0741   NA 140 03/11/2018 1606   K 4.2 11/20/2018 0741   CL 103 11/20/2018 0741   CO2 21 (L) 11/20/2018 0741   GLUCOSE 227 (H) 11/20/2018 0741   BUN 18 11/20/2018 0741   BUN 12 03/11/2018 1606   CREATININE 1.24 11/20/2018 0741   CREATININE 1.10 08/25/2016 1642   CALCIUM 8.6 (L) 11/20/2018 0741   PROT 6.1 (L) 11/12/2018 0529   PROT 6.4 03/11/2018 1606   ALBUMIN 3.7 11/12/2018 0529   ALBUMIN 4.2 03/11/2018 1606   AST 32 11/12/2018 0529   ALT 21 11/12/2018 0529   ALKPHOS 107 11/12/2018 0529   BILITOT 0.4 11/12/2018 0529   BILITOT 0.3 03/11/2018 1606   GFRNONAA >60 11/20/2018 0741   GFRNONAA >89 08/31/2014 1627   GFRAA >60 11/20/2018 0741   GFRAA >89 08/31/2014 1627    Lipid Panel     Component Value Date/Time   CHOL 144 11/20/2018 0455   CHOL 125 09/29/2017 1044   TRIG 173 (H) 11/20/2018 0455   HDL 41 11/20/2018 0455   HDL 40 09/29/2017 1044   CHOLHDL 3.5 11/20/2018 0455   VLDL 35 11/20/2018 0455   LDLCALC 68  11/20/2018 0455   LDLCALC 61 09/29/2017 1044   2D echocardiogram with normal left atrial size, normal LVEF A1c 8.2 LDL 68  Imaging I have reviewed the images obtained:  MRI examination of the brain no evidence of stroke CTA head and neck normal   Assessment:  62 year old man past medical history of coronary artery disease, diabetes, hypertension, Barrett's esophagus presents to the emergency room for evaluation of dizziness and back pain.  Back pain has been chronic but on further questioning about his dizziness he describes these episodes where he feels out of sorts and has out of body experiences-he is also had intermittent left facial weakness and describes having had multiple episodes of left arm jerking that has been uncontrollable-resolves on its own. I am suspicious that this gentleman might be having focal seizures that have gone unrecognized. Imaging has been negative for stroke-he has multiple risk factors.  He should continue to be on antiplatelet but at this time I would recommend getting an EEG and starting him on Keppra.  Impression: Intermittent left facial weakness Evaluate for seizure Evaluate for stroke-? MRI negative small vessel stroke or fluctuating lacunar  Recommendations:  From a stroke perspective: -Stroke work-up has been completed -Continue with aspirin -Continue with statin-goal LDL less than 70. -Management of diabetes per primary team-goal hemoglobin A1c less than 7 -PT OT speech therapy  From a possible seizure perspective: -Routine EEG in the morning -Start Keppra 500 twice daily -Seizure precautions including no driving unless at least 6 months seizure free.  Primary team to provide patient with seizure precaution instructions and state law details.  Follow-up with outpatient neurology in 4-6 weeks after discharge  Our service will follow up on the EEG results.  Please call neurology with questions.  -- Milon Dikes, MD [facility] Pager: [phone] If 7pm to 7am, please call on call as listed on AMION.

## 2018-11-20 NOTE — Progress Notes (Signed)
Received report from W/L

## 2018-11-20 NOTE — Progress Notes (Signed)
Carotid artery duplex has been completed. Preliminary results can be found in CV Proc through chart review.   11/20/18 9:04 AM Olen Cordial RVT

## 2018-11-20 NOTE — Progress Notes (Addendum)
I attempted to call report at 1645, but the receiving nurse was unable to take my call. I was told Judie Grieve, the nurse would call me back. My number was provided.   Judie Grieve called back at 1715 for report. After providing report on the pt, the nurse reported no further questions or concerns.

## 2018-11-20 NOTE — Progress Notes (Signed)
Pt to be transferred to [facility] for neurology services. Asked patient if he wanted [place] to call anyone. Pt refused staff callling family/friends and stated that he would do it.

## 2018-11-20 NOTE — Progress Notes (Addendum)
At 1504, Regalado, MD was paged regarding the pt's NIHSS of 4. The score was 2 at 1000. The pt present with right sided facial drooping/weakness when the pt attempted to smile. The pt's blood sugar was 88 prior to eating. I rechecked the pt's blood sugar post meal (pt ate 100%) and his blood sugar was 77. I gave the pt 240 ml of juice. I will continue to monitor the pt.  Dextrose 50% solution 50 ml was given at 1519 per Regalado, MD verbal orders. Blood sugar was 130 at 1534.  At 1527, the pt was bladder scanned with a volume of 359 ml.

## 2018-11-20 NOTE — Progress Notes (Signed)
At 0758, Sunnie Nielsen, MD was notified d/t the pt eating breakfast when the pt was advised not to eat anything d/t a scheduled stress test this morning.

## 2018-11-20 NOTE — Progress Notes (Signed)
PT Cancellation Note  Patient Details Name: [name] [name] MRN: [id number] DOB: 1956-04-19   Cancelled Treatment:    Reason Eval/Treat Not Completed: Medical issues which prohibited therapy   Rebeca Alert, PT Acute Rehabilitation Services Pager: [phone] Office: [phone]

## 2018-11-20 NOTE — Progress Notes (Addendum)
DAILY PROGRESS NOTE   Patient Name: [name] [name] Date of Encounter: 11/20/2018  Chief Complaint   No back pain  Patient Profile   62 yo male with history of CAD (NSTEMI in 2015 - DES to RCA), LVEF 50-65%, DM2, HTN, HLD and peripheral neuropathy, presents with dizziness, vision changes and back pain which is apparently his "anginal equivalent".  Subjective   No complaints overnight. Back pain mostly resolved. He is still quite anxious. Echo yesterday shows LVEF 60-65%, mild LVH and grade 1 DD. No regional wall motion abnormalities. Ruled out for ACS by troponins. BP improved today.  Unfortunately, looks like he at a small amount of breakfast today.  Objective   Vitals:   11/19/18 1330 11/19/18 1616 11/19/18 2105 11/20/18 0458  BP: (!) 151/82 (!) 148/82 (!) 159/79 122/62  Pulse: 98 96 (!) 105 88  Resp: (!) 21 20 18 18   Temp:  97.9 F (36.6 C) 97.8 F (36.6 C) 97.8 F (36.6 C)  TempSrc:  Oral Oral Oral  SpO2: 100% 96% 97% 97%  Weight:  107.1 kg    Height:  5\' 9"  (1.753 m)      Intake/Output Summary (Last 24 hours) at 11/20/2018 1308 Last data filed at 11/20/2018 6578 Gross per 24 hour  Intake 785.11 ml  Output 1350 ml  Net -564.89 ml   Filed Weights   11/19/18 1616  Weight: 107.1 kg    Physical Exam   General appearance: alert and no distress Lungs: clear to auscultation bilaterally Heart: regular rate and rhythm, S1, S2 normal, no murmur, click, rub or gallop Extremities: extremities normal, atraumatic, no cyanosis or edema Neurologic: Mental status: Alert, oriented, thought content appropriate  Inpatient Medications    Scheduled Meds: . enoxaparin (LOVENOX) injection  40 mg Subcutaneous Q24H  . gabapentin  100 mg Oral BID  . Influenza vac split quadrivalent PF  0.5 mL Intramuscular Tomorrow-1000  . insulin aspart  0-15 Units Subcutaneous TID WC  . insulin aspart  0-5 Units Subcutaneous QHS  . insulin detemir  40 Units Subcutaneous QHS  . insulin  detemir  45 Units Subcutaneous q morning - 10a  . lisinopril  2.5 mg Oral Daily  . metoprolol tartrate  12.5 mg Oral BID  . pantoprazole  40 mg Oral Daily  . pneumococcal 23 valent vaccine  0.5 mL Intramuscular Tomorrow-1000  . prasugrel  10 mg Oral Daily  . rosuvastatin  40 mg Oral Daily    Continuous Infusions:   PRN Meds: acetaminophen, HYDROcodone-acetaminophen, morphine injection, nitroGLYCERIN, ondansetron (ZOFRAN) IV   Labs   Results for orders placed or performed during the hospital encounter of 11/19/18 (from the past 48 hour(s))  Urinalysis, Routine w reflex microscopic     Status: Abnormal   Collection Time: 11/19/18  7:55 AM  Result Value Ref Range   Color, Urine STRAW (A) YELLOW   APPearance CLEAR CLEAR   Specific Gravity, Urine 1.017 1.005 - 1.030   pH 5.0 5.0 - 8.0   Glucose, UA >=500 (A) NEGATIVE mg/dL   Hgb urine dipstick NEGATIVE NEGATIVE   Bilirubin Urine NEGATIVE NEGATIVE   Ketones, ur 5 (A) NEGATIVE mg/dL   Protein, ur NEGATIVE NEGATIVE mg/dL   Nitrite NEGATIVE NEGATIVE   Leukocytes, UA NEGATIVE NEGATIVE   RBC / HPF 0-5 0 - 5 RBC/hpf   WBC, UA 0-5 0 - 5 WBC/hpf   Bacteria, UA NONE SEEN NONE SEEN   Squamous Epithelial / LPF 0-5 0 - 5  Comment: Performed at [facility], [address] [place], [place] [address]  CBC     Status: Abnormal   Collection Time: 11/19/18  8:19 AM  Result Value Ref Range   WBC 4.7 4.0 - 10.5 K/uL   RBC 4.52 4.22 - 5.81 MIL/uL   Hemoglobin 12.8 (L) 13.0 - 17.0 g/dL   HCT 44.039.4 34.739.0 - 42.552.0 %   MCV 87.2 80.0 - 100.0 fL   MCH 28.3 26.0 - 34.0 pg   MCHC 32.5 30.0 - 36.0 g/dL   RDW 95.613.2 38.711.5 - 56.415.5 %   Platelets 257 150 - 400 K/uL   nRBC 0.0 0.0 - 0.2 %    Comment: Performed at [facility], [address] [place], [place] [address]  Troponin I - ONCE - STAT     Status: None   Collection Time: 11/19/18  8:19 AM  Result Value Ref Range   Troponin I <0.03 <0.03 ng/mL    Comment:  Performed at [facility], [address] [place], [place] [address]  Brain natriuretic peptide     Status: None   Collection Time: 11/19/18  8:19 AM  Result Value Ref Range   B Natriuretic Peptide 10.2 0.0 - 100.0 pg/mL    Comment: Performed at [facility], [address] [place], [place] [address]  CBG monitoring, ED     Status: Abnormal   Collection Time: 11/19/18  8:25 AM  Result Value Ref Range   Glucose-Capillary 298 (H) 70 - 99 mg/dL  I-stat troponin, ED     Status: None   Collection Time: 11/19/18  8:32 AM  Result Value Ref Range   Troponin i, poc 0.01 0.00 - 0.08 ng/mL   Comment 3            Comment: Due to the release kinetics of cTnI, a negative result within the first hours of the onset of symptoms does not rule out myocardial infarction with certainty. If myocardial infarction is still suspected, repeat the test at appropriate intervals.   I-stat chem 8, ed     Status: Abnormal   Collection Time: 11/19/18  8:33 AM  Result Value Ref Range   Sodium 137 135 - 145 mmol/L   Potassium 4.1 3.5 - 5.1 mmol/L   Chloride 104 98 - 111 mmol/L   BUN 8 6 - 20 mg/dL   Creatinine, Ser 0.160.80 0.61 - 1.24 mg/dL   Glucose, Bld 010337 (H) 70 - 99 mg/dL   Calcium, Ion 9.321.11 (L) 1.15 - 1.40 mmol/L   TCO2 25 22 - 32 mmol/L   Hemoglobin 12.9 (L) 13.0 - 17.0 g/dL   HCT 35.538.0 (L) 73.239.0 - 20.252.0 %  Troponin I - Now Then Q6H     Status: None   Collection Time: 11/19/18 11:18 AM  Result Value Ref Range   Troponin I <0.03 <0.03 ng/mL    Comment: Performed at [facility], [address] [place], [place] [address]  Troponin I - Now Then Q6H     Status: None   Collection Time: 11/19/18  4:32 PM  Result Value Ref Range   Troponin I <0.03 <0.03 ng/mL    Comment: Performed at [facility], [address] [place], [place] [address]  Troponin I - Now Then Q3H     Status: None   Collection Time: 11/19/18  6:26 PM  Result  Value Ref Range   Troponin I <0.03 <0.03 ng/mL  Comment: Performed at [facility], [address], [place], [place] [address]  Glucose, capillary     Status: Abnormal   Collection Time: 11/19/18  9:02 PM  Result Value Ref Range   Glucose-Capillary 520 (HH) 70 - 99 mg/dL  Glucose, capillary     Status: Abnormal   Collection Time: 11/19/18 11:53 PM  Result Value Ref Range   Glucose-Capillary 481 (H) 70 - 99 mg/dL  Glucose, capillary     Status: Abnormal   Collection Time: 11/20/18  3:38 AM  Result Value Ref Range   Glucose-Capillary 230 (H) 70 - 99 mg/dL  Glucose, capillary     Status: Abnormal   Collection Time: 11/20/18  7:31 AM  Result Value Ref Range   Glucose-Capillary 197 (H) 70 - 99 mg/dL    ECG   N/A  Telemetry   Sinus tachycardia- Personally Reviewed  Radiology    Ct Head Wo Contrast  Result Date: 11/19/2018 CLINICAL DATA:  Syncopal episode last night, dizziness, history heart disease, type II diabetes mellitus, coronary artery disease post MI, hypertension, former smoker EXAM: CT HEAD WITHOUT CONTRAST TECHNIQUE: Contiguous axial images were obtained from the base of the skull through the vertex without intravenous contrast. Sagittal and coronal MPR images reconstructed from axial data set. COMPARISON:  09/06/2018 FINDINGS: Brain: Normal ventricular morphology. No midline shift or mass effect. Normal appearance of brain parenchyma. No intracranial hemorrhage, mass lesion, evidence of acute infarction, or extra-axial fluid collection. Vascular: No hyperdense vessels Skull: Intact, unremarkable Sinuses/Orbits: Clear Other: N/A IMPRESSION: Normal exam. Electronically Signed   By: Ulyses Southward M.D.   On: 11/19/2018 11:53   Mr Laqueta Jean UE Contrast  Result Date: 11/19/2018 CLINICAL DATA:  Dizziness. Lightheaded. Weakness beginning last night EXAM: MRI HEAD WITHOUT AND WITH CONTRAST TECHNIQUE: Multiplanar, multiecho pulse sequences of the brain and  surrounding structures were obtained without and with intravenous contrast. CONTRAST:  10 cc Gadavist COMPARISON:  Head CT same day. FINDINGS: Brain: Mild age related volume loss. No evidence of old or acute small or large vessel infarction, mass lesion, hemorrhage, hydrocephalus or extra-axial collection. Vascular: Major vessels at the base of the brain show flow. Skull and upper cervical spine: Negative Sinuses/Orbits: Clear/normal Other: None IMPRESSION: Mild age related volume loss.  No acute or focal finding. Electronically Signed   By: Paulina Fusi M.D.   On: 11/19/2018 17:29    Cardiac Studies   LV EF: 60% -   65%  ------------------------------------------------------------------- Indications:      Syncope 780.2.  ------------------------------------------------------------------- History:   PMH:   Coronary artery disease.  PMH:  NSTEMI  Risk factors:  Acute kidney injury Diabetes mellitus. Dyslipidemia.  ------------------------------------------------------------------- Study Conclusions  - Left ventricle: The cavity size was normal. Wall thickness was   increased in a pattern of mild LVH. Systolic function was normal.   The estimated ejection fraction was in the range of 60% to 65%.   Wall motion was normal; there were no regional wall motion   abnormalities. Doppler parameters are consistent with abnormal   left ventricular relaxation (grade 1 diastolic dysfunction).  Impressions:  - Normal LV systolic function; mild diastolic dysfunction; mild   LVH.  Assessment   1. Principal Problem: 2.   Chest pain, atypical 3. Active Problems: 4.   DM (diabetes mellitus), type 2, uncontrolled (HCC) 5.   Peripheral neuropathy 6.   CAD (coronary artery disease) 7.   Hyperlipidemia 8.   Uncontrolled diabetes mellitus type 2 with peripheral artery  disease (HCC) 9.   Old myocardial infarction 10.   Dizziness 11.   Plan   1. Ruled-out for ACS overnight. Unclear if he  truly had syncope - echo shows normal systolic function. Plan was to proceed with myoview stress test today given chest pain, however, he had a small amount of breakfast this morning. Keep NPO - will d/w nuclear medicine. If it cannot be scheduled today, could possible d/c home and proceed with outpatient testing in the office. Would not recommend outpatient monitoring at this point unless he has further documented episodes.  Time Spent Directly with Patient:  I have spent a total of 25 minutes with the patient reviewing hospital notes, telemetry, EKGs, labs and examining the patient as well as establishing an assessment and plan that was discussed personally with the patient.  > 50% of time was spent in direct patient care.  Length of Stay:  LOS: 0 days   Chrystie Nose, MD, [facility], FACP  [place]  [facility]  Medical Director of the [facility]  [facility] Diplomate of the American Board of Clinical Lipidology Attending Cardiologist  Direct Dial: [phone]  Fax: [phone]  Website:  [email or web address] Nicely Hilty 11/20/2018, 8:06 AM

## 2018-11-20 NOTE — Progress Notes (Signed)
Patient arrived on unit, cbg taken = 34, grape juice with 4pks of sugar given, patient A&O x4 communicating well

## 2018-11-21 ENCOUNTER — Telehealth: Payer: Self-pay | Admitting: Family Medicine

## 2018-11-21 ENCOUNTER — Observation Stay (HOSPITAL_COMMUNITY): Payer: Medicare HMO

## 2018-11-21 DIAGNOSIS — G459 Transient cerebral ischemic attack, unspecified: Secondary | ICD-10-CM | POA: Diagnosis not present

## 2018-11-21 DIAGNOSIS — I2510 Atherosclerotic heart disease of native coronary artery without angina pectoris: Secondary | ICD-10-CM | POA: Diagnosis not present

## 2018-11-21 DIAGNOSIS — R569 Unspecified convulsions: Secondary | ICD-10-CM | POA: Diagnosis not present

## 2018-11-21 DIAGNOSIS — R0789 Other chest pain: Secondary | ICD-10-CM | POA: Diagnosis not present

## 2018-11-21 DIAGNOSIS — R42 Dizziness and giddiness: Secondary | ICD-10-CM | POA: Diagnosis not present

## 2018-11-21 DIAGNOSIS — E782 Mixed hyperlipidemia: Secondary | ICD-10-CM | POA: Diagnosis not present

## 2018-11-21 LAB — GLUCOSE, CAPILLARY
Glucose-Capillary: 123 mg/dL — ABNORMAL HIGH (ref 70–99)
Glucose-Capillary: 318 mg/dL — ABNORMAL HIGH (ref 70–99)
Glucose-Capillary: 294 mg/dL — ABNORMAL HIGH (ref 70–99)
Glucose-Capillary: 182 mg/dL — ABNORMAL HIGH (ref 70–99)

## 2018-11-21 NOTE — Progress Notes (Signed)
EEG completed, results pending. 

## 2018-11-21 NOTE — Telephone Encounter (Signed)
Patient was approved for the one touch verio strips. Patient has not picked them up yet from the pharmacy.

## 2018-11-21 NOTE — Care Management Obs Status (Signed)
MEDICARE OBSERVATION STATUS NOTIFICATION   Patient Details  Name: [name] [name] MRN: [id number] Date of Birth: Jun 19, 1956   Medicare Observation Status Notification Given:  Yes    Kermit Balo, RN 11/21/2018, 4:27 PM

## 2018-11-21 NOTE — Progress Notes (Signed)
PROGRESS NOTE        PATIENT DETAILS Name: [name] [name] Age: 62 y.o. Sex: male Date of Birth: February 12, 1956 Admit Date: 11/19/2018 Admitting Physician Merlene Laughter, DO ZOX:WRUE, Levell July, MD  Brief Narrative: Patient is a 62 y.o. male with history of CAD status post PCI in 2015, DM-2, hypertension, peripheral neuropathy who presented to the ED for evaluation of dizziness be, vision changes.  Patient was subsequently admitted to the hospitalist service for further evaluation and treatment.  Subjective: Lying comfortably in bed-denies any chest pain or shortness of breath.  Assessment/Plan: Chest pain: Resolved-echocardiogram with preserved EF-spoke with cardiologist-Dr. Hilty-does not advise any further inpatient work-up, they will arrange for outpatient follow-up with his primary cardiologist.  ?  Syncope/dizziness: Telemetry unremarkable-Echo with preserved EF.  No further work-up recommended by cardiology.  Possible focal seizures: Neurology following-no further left arm jerking movement this morning-EEG pending, MRI brain without acute CVA.  Continue Keppra.  No driving and other seizure precautions for at least 6 months.  Awaiting neurology follow-up and formal reading of EEG  TIA vs seizures: Did have a left facial droop on 12/22 that has since resolved-transferred to [facility] for neurology evaluation.  MRI brain without any ischemic foci.  Carotid Doppler without any significant stenosis.  Echo with preserved EF.  Telemetry without A. fib.  A1c 8.2.  LDL 68.  Continue aspirin and statin.  Awaiting evaluation by rehab services.  Insulin-dependent DM: CBG electively stable-however one reading at 300 this morning.  Continue Levemir 45 units every morning-continue SSI.  Will follow and adjust accordingly.  Hypertension: Stable-continue metoprolol, lisinopril.  GERD: Continue PPI  Obesity: Counseled regarding importance of weight loss  DVT  Prophylaxis: Prophylactic Lovenox   Code Status: Full code   Family Communication: None at bedside  Disposition Plan: Remain inpatient  Antimicrobial agents: Anti-infectives (From admission, onward)   None      Procedures: None  CONSULTS:  neurology  Time spent: 25 minutes-Greater than 50% of this time was spent in counseling, explanation of diagnosis, planning of further management, and coordination of care.  MEDICATIONS: Scheduled Meds: . aspirin  325 mg Oral Daily  . enoxaparin (LOVENOX) injection  40 mg Subcutaneous Q24H  . gabapentin  100 mg Oral BID  . insulin aspart  0-15 Units Subcutaneous TID WC  . insulin aspart  0-5 Units Subcutaneous QHS  . insulin detemir  45 Units Subcutaneous q morning - 10a  . lisinopril  2.5 mg Oral Daily  . metoprolol tartrate  12.5 mg Oral BID  . pantoprazole  40 mg Oral Daily  . rosuvastatin  40 mg Oral Daily   Continuous Infusions: . sodium chloride 75 mL/hr at 11/21/18 0655   PRN Meds:.acetaminophen, HYDROcodone-acetaminophen, nitroGLYCERIN, ondansetron (ZOFRAN) IV   PHYSICAL EXAM: Vital signs: Vitals:   11/20/18 2355 11/21/18 0410 11/21/18 0735 11/21/18 1258  BP: 124/70 120/68 134/68 (!) 143/77  Pulse: 87 87 93 90  Resp: 18 16 20 20   Temp: 98.4 F (36.9 C) 98.9 F (37.2 C) 98.5 F (36.9 C) 98.2 F (36.8 C)  TempSrc: Oral Oral Oral Oral  SpO2: 96% 100% 95% 96%  Weight:      Height:       Filed Weights   11/19/18 1616  Weight: 107.1 kg   Body mass index is 34.87 kg/m.   General appearance :  Awake, alert, not in any distress.  HEENT: Atraumatic and Normocephalic Neck: supple Resp:Good air entry bilaterally, no added sounds  CVS: S1 S2 regular, no murmurs.  GI: Bowel sounds present, Non tender and not distended with no gaurding, rigidity or rebound.No organomegaly Extremities: B/L Lower Ext shows no edema, both legs are warm to touch Neurology:  speech clear,Non focal, sensation is grossly  intact. Musculoskeletal:No digital cyanosis Skin:No Rash, warm and dry Wounds:N/A  I have personally reviewed following labs and imaging studies  LABORATORY DATA: CBC: Recent Labs  Lab 11/19/18 0819 11/19/18 0833 11/20/18 0741  WBC 4.7  --  6.5  HGB 12.8* 12.9* 12.3*  HCT 39.4 38.0* 38.1*  MCV 87.2  --  88.2  PLT 257  --  287    Basic Metabolic Panel: Recent Labs  Lab 11/19/18 0833 11/20/18 0741  NA 137 136  K 4.1 4.2  CL 104 103  CO2  --  21*  GLUCOSE 337* 227*  BUN 8 18  CREATININE 0.80 1.24  CALCIUM  --  8.6*    GFR: Estimated Creatinine Clearance: 76.4 mL/min (by C-G formula based on SCr of 1.24 mg/dL).  Liver Function Tests: No results for input(s): AST, ALT, ALKPHOS, BILITOT, PROT, ALBUMIN in the last 168 hours. No results for input(s): LIPASE, AMYLASE in the last 168 hours. No results for input(s): AMMONIA in the last 168 hours.  Coagulation Profile: No results for input(s): INR, PROTIME in the last 168 hours.  Cardiac Enzymes: Recent Labs  Lab 11/19/18 0819 11/19/18 1118 11/19/18 1632 11/19/18 1826  TROPONINI <0.03 <0.03 <0.03 <0.03    BNP (last 3 results) No results for input(s): PROBNP in the last 8760 hours.  HbA1C: Recent Labs    11/20/18 0455  HGBA1C 8.2*    CBG: Recent Labs  Lab 11/20/18 1832 11/20/18 1914 11/20/18 2130 11/21/18 0634 11/21/18 1224  GLUCAP 34* 89 90 123* 318*    Lipid Profile: Recent Labs    11/20/18 0455  CHOL 144  HDL 41  LDLCALC 68  TRIG 173*  CHOLHDL 3.5    Thyroid Function Tests: No results for input(s): TSH, T4TOTAL, FREET4, T3FREE, THYROIDAB in the last 72 hours.  Anemia Panel: No results for input(s): VITAMINB12, FOLATE, FERRITIN, TIBC, IRON, RETICCTPCT in the last 72 hours.  Urine analysis:    Component Value Date/Time   COLORURINE STRAW (A) 11/19/2018 0755   APPEARANCEUR CLEAR 11/19/2018 0755   LABSPEC 1.017 11/19/2018 0755   PHURINE 5.0 11/19/2018 0755   GLUCOSEU >=500 (A)  11/19/2018 0755   HGBUR NEGATIVE 11/19/2018 0755   BILIRUBINUR NEGATIVE 11/19/2018 0755   BILIRUBINUR negative 03/11/2018 1613   KETONESUR 5 (A) 11/19/2018 0755   PROTEINUR NEGATIVE 11/19/2018 0755   UROBILINOGEN 0.2 03/11/2018 1613   UROBILINOGEN 0.2 06/14/2014 0809   NITRITE NEGATIVE 11/19/2018 0755   LEUKOCYTESUR NEGATIVE 11/19/2018 0755    Sepsis Labs: Lactic Acid, Venous    Component Value Date/Time   LATICACIDVEN 0.6 07/22/2013 0748    MICROBIOLOGY: No results found for this or any previous visit (from the past 240 hour(s)).  RADIOLOGY STUDIES/RESULTS: Ct Angio Head W Or Wo Contrast  Result Date: 11/20/2018 CLINICAL DATA:  Dizziness and lightheadedness. Weakness. Symptoms began yesterday. EXAM: CT ANGIOGRAPHY HEAD AND NECK TECHNIQUE: Multidetector CT imaging of the head and neck was performed using the standard protocol during bolus administration of intravenous contrast. Multiplanar CT image reconstructions and MIPs were obtained to evaluate the vascular anatomy. Carotid stenosis measurements (when applicable) are obtained utilizing  NASCET criteria, using the distal internal carotid diameter as the denominator. CONTRAST:  18mL ISOVUE-370 IOPAMIDOL (ISOVUE-370) INJECTION 76% COMPARISON:  MRI and CT studies done yesterday. FINDINGS: CT HEAD FINDINGS Brain: Mild age related atrophy. No evidence of old or acute infarction, mass lesion, hemorrhage, hydrocephalus or extra-axial collection. Vascular: Minor calcification at the base of the brain. Skull: Normal Sinuses: Clear Orbits: Normal Review of the MIP images confirms the above findings CTA NECK FINDINGS Aortic arch: Normal Right carotid system: Common carotid artery widely patent to the bifurcation. Carotid bifurcation is normal without soft or calcified plaque. Cervical ICA is normal. Left carotid system: Common carotid artery widely patent to the bifurcation. Carotid bifurcation is normal without soft or calcified plaque. Cervical  ICA is normal. Vertebral arteries: Both vertebral arteries are widely patent at their origins, though detail is limited on the right because of streak artifact. Both vertebral arteries appear patent through the cervical region to the foramen magnum. Skeleton: Mid cervical spondylosis. Ordinary cervical facet arthropathy. Other neck: No mass or lymphadenopathy. Upper chest: Negative Review of the MIP images confirms the above findings CTA HEAD FINDINGS Anterior circulation: Internal carotid arteries are widely patent through the skull base and siphon regions. The anterior and middle cerebral vessels are patent without proximal stenosis, aneurysm or vascular malformation. No embolic occlusions are identified. Posterior circulation: Both vertebral arteries are widely patent to the basilar. No basilar stenosis. Posterior circulation vessels appear normal without evidence of embolic occlusion. Venous sinuses: Patent and normal. Anatomic variants: None significant. Delayed phase: No abnormal enhancement. Review of the MIP images confirms the above findings IMPRESSION: Normal CT angiography of the neck and head. Electronically Signed   By: Paulina Fusi M.D.   On: 11/20/2018 16:45   Dg Chest 2 View  Result Date: 11/20/2018 CLINICAL DATA:  Dizziness, lightheadedness, back pain, history GERD, coronary artery disease, type II diabetes mellitus, hypertension EXAM: CHEST - 2 VIEW COMPARISON:  09/29/2017 FINDINGS: Minimal enlargement of cardiac silhouette. Mediastinal contours and pulmonary vascularity normal. Lungs clear. No pleural effusion or pneumothorax. Bones unremarkable. IMPRESSION: No acute abnormalities. Electronically Signed   By: Ulyses Southward M.D.   On: 11/20/2018 16:39   Ct Head Wo Contrast  Result Date: 11/19/2018 CLINICAL DATA:  Syncopal episode last night, dizziness, history heart disease, type II diabetes mellitus, coronary artery disease post MI, hypertension, former smoker EXAM: CT HEAD WITHOUT  CONTRAST TECHNIQUE: Contiguous axial images were obtained from the base of the skull through the vertex without intravenous contrast. Sagittal and coronal MPR images reconstructed from axial data set. COMPARISON:  09/06/2018 FINDINGS: Brain: Normal ventricular morphology. No midline shift or mass effect. Normal appearance of brain parenchyma. No intracranial hemorrhage, mass lesion, evidence of acute infarction, or extra-axial fluid collection. Vascular: No hyperdense vessels Skull: Intact, unremarkable Sinuses/Orbits: Clear Other: N/A IMPRESSION: Normal exam. Electronically Signed   By: Ulyses Southward M.D.   On: 11/19/2018 11:53   Ct Angio Neck W Or Wo Contrast  Result Date: 11/20/2018 CLINICAL DATA:  Dizziness and lightheadedness. Weakness. Symptoms began yesterday. EXAM: CT ANGIOGRAPHY HEAD AND NECK TECHNIQUE: Multidetector CT imaging of the head and neck was performed using the standard protocol during bolus administration of intravenous contrast. Multiplanar CT image reconstructions and MIPs were obtained to evaluate the vascular anatomy. Carotid stenosis measurements (when applicable) are obtained utilizing NASCET criteria, using the distal internal carotid diameter as the denominator. CONTRAST:  4mL ISOVUE-370 IOPAMIDOL (ISOVUE-370) INJECTION 76% COMPARISON:  MRI and CT studies done yesterday. FINDINGS: CT HEAD  FINDINGS Brain: Mild age related atrophy. No evidence of old or acute infarction, mass lesion, hemorrhage, hydrocephalus or extra-axial collection. Vascular: Minor calcification at the base of the brain. Skull: Normal Sinuses: Clear Orbits: Normal Review of the MIP images confirms the above findings CTA NECK FINDINGS Aortic arch: Normal Right carotid system: Common carotid artery widely patent to the bifurcation. Carotid bifurcation is normal without soft or calcified plaque. Cervical ICA is normal. Left carotid system: Common carotid artery widely patent to the bifurcation. Carotid bifurcation is  normal without soft or calcified plaque. Cervical ICA is normal. Vertebral arteries: Both vertebral arteries are widely patent at their origins, though detail is limited on the right because of streak artifact. Both vertebral arteries appear patent through the cervical region to the foramen magnum. Skeleton: Mid cervical spondylosis. Ordinary cervical facet arthropathy. Other neck: No mass or lymphadenopathy. Upper chest: Negative Review of the MIP images confirms the above findings CTA HEAD FINDINGS Anterior circulation: Internal carotid arteries are widely patent through the skull base and siphon regions. The anterior and middle cerebral vessels are patent without proximal stenosis, aneurysm or vascular malformation. No embolic occlusions are identified. Posterior circulation: Both vertebral arteries are widely patent to the basilar. No basilar stenosis. Posterior circulation vessels appear normal without evidence of embolic occlusion. Venous sinuses: Patent and normal. Anatomic variants: None significant. Delayed phase: No abnormal enhancement. Review of the MIP images confirms the above findings IMPRESSION: Normal CT angiography of the neck and head. Electronically Signed   By: Paulina Fusi M.D.   On: 11/20/2018 16:45   Mr Laqueta Jean ZO Contrast  Result Date: 11/19/2018 CLINICAL DATA:  Dizziness. Lightheaded. Weakness beginning last night EXAM: MRI HEAD WITHOUT AND WITH CONTRAST TECHNIQUE: Multiplanar, multiecho pulse sequences of the brain and surrounding structures were obtained without and with intravenous contrast. CONTRAST:  10 cc Gadavist COMPARISON:  Head CT same day. FINDINGS: Brain: Mild age related volume loss. No evidence of old or acute small or large vessel infarction, mass lesion, hemorrhage, hydrocephalus or extra-axial collection. Vascular: Major vessels at the base of the brain show flow. Skull and upper cervical spine: Negative Sinuses/Orbits: Clear/normal Other: None IMPRESSION: Mild age  related volume loss.  No acute or focal finding. Electronically Signed   By: Paulina Fusi M.D.   On: 11/19/2018 17:29   Vas US Carotid (at [facility] Only)  Result Date: 11/20/2018 Carotid Arterial Duplex Study Indications: Syncope. Performing Technologist: Chanda Busing RVT  Examination Guidelines: A complete evaluation includes B-mode imaging, spectral Doppler, color Doppler, and power Doppler as needed of all accessible portions of each vessel. Bilateral testing is considered an integral part of a complete examination. Limited examinations for reoccurring indications may be performed as noted.  Right Carotid Findings: +----------+--------+--------+--------+-----------------------+--------+           PSV cm/sEDV cm/sStenosisDescribe               Comments +----------+--------+--------+--------+-----------------------+--------+ CCA Prox  132     17              smooth and heterogenous         +----------+--------+--------+--------+-----------------------+--------+ CCA Distal118     24              smooth and heterogenous         +----------+--------+--------+--------+-----------------------+--------+ ICA Prox  107     13                                              +----------+--------+--------+--------+-----------------------+--------+  ICA Distal87      27                                     tortuous +----------+--------+--------+--------+-----------------------+--------+ ECA       157     17                                              +----------+--------+--------+--------+-----------------------+--------+ +----------+--------+-------+--------+-------------------+           PSV cm/sEDV cmsDescribeArm Pressure (mmHG) +----------+--------+-------+--------+-------------------+ [id number]                                        +----------+--------+-------+--------+-------------------+ +---------+--------+--+--------+--+---------+ VertebralPSV  cm/s64EDV cm/s13Antegrade +---------+--------+--+--------+--+---------+  Left Carotid Findings: +----------+--------+--------+--------+-----------------------+--------+           PSV cm/sEDV cm/sStenosisDescribe               Comments +----------+--------+--------+--------+-----------------------+--------+ CCA Prox  154     16              smooth and heterogenous         +----------+--------+--------+--------+-----------------------+--------+ CCA Distal122     21              smooth and heterogenous         +----------+--------+--------+--------+-----------------------+--------+ ICA Prox  117     20              smooth and heterogenous         +----------+--------+--------+--------+-----------------------+--------+ ICA Distal102     27                                              +----------+--------+--------+--------+-----------------------+--------+ ECA       129     10                                              +----------+--------+--------+--------+-----------------------+--------+ +----------+--------+--------+--------+-------------------+ SubclavianPSV cm/sEDV cm/sDescribeArm Pressure (mmHG) +----------+--------+--------+--------+-------------------+           172                                         +----------+--------+--------+--------+-------------------+ +---------+--------+--+--------+--+---------+ VertebralPSV cm/s69EDV cm/s13Antegrade +---------+--------+--+--------+--+---------+  Summary: Right Carotid: Velocities in the right ICA are consistent with a 1-39% stenosis. Left Carotid: Velocities in the left ICA are consistent with a 1-39% stenosis. Vertebrals: Bilateral vertebral arteries demonstrate antegrade flow. *See table(s) above for measurements and observations.  Electronically signed by Sherald Hesshristopher Clark MD on 11/20/2018 at 10:42:12 AM.    Final      LOS: 0 days   Jeoffrey MassedShanker Maris Abascal, MD  [facility]  If 7PM-7AM,  please contact night-coverage  Please page via [email or web address]-Password [email or web address]-click on MD name and type text message  11/21/2018, 3:50 PM

## 2018-11-21 NOTE — Evaluation (Signed)
Occupational Therapy Evaluation Patient Details Name: [name] [name]: [id number] DOB: 21-Aug-1956 Today's Date: 11/21/2018    History of Present Illness Pt is a 62 year old male with past medical history significant for Barrett's esophagus, CAD status post stent, diabetes type 2 uncontrolled, hypertension, peripheral neuropathy who presents complaining of dizziness and lightheadedness along with back pain. CT results normal and MRI negative for CVA   Clinical Impression   This 62 y/o male presents with the above. At baseline pt is independent with ADLs and functional mobility. 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 Pt performing functional mobility without AD and overall minguard assist, one period of minA as pt with slight LOB when turning to sit EOB, reports due to dizziness. Pt endorses intermittent dizziness but declines signs/symptoms throughout remainder of mobility this session. Pt currently requires setup assist for UB ADL, minA for LB ADL. Pt will benefit from continued OT services while in acute setting to maximize his safety and independence with ADLs and mobility. Will follow.     Follow Up Recommendations  No OT follow up;Supervision - Intermittent    Equipment Recommendations  None recommended by OT    Recommendations for Other Services       Precautions / Restrictions Precautions Precautions: Fall Restrictions Weight Bearing Restrictions: No      Mobility Bed Mobility Overal bed mobility: Needs Assistance Bed Mobility: Supine to Sit     Supine to sit: Supervision     General bed mobility comments: for general safety, HOB slightly elevated with no physical assist required  Transfers Overall transfer level: Needs assistance Equipment used: None Transfers: Sit to/from Stand Sit to Stand: Min guard          General transfer comment: for safety and immediate standing balance, no physical assist required    Balance Overall balance assessment: Needs assistance Sitting-balance support: Feet supported Sitting balance-Leahy Scale: Good     Standing balance support: No upper extremity supported;During functional activity Standing balance-Leahy Scale: Fair Standing balance comment: minguard for balance                           ADL either performed or assessed with clinical judgement   ADL Overall ADL's : Needs assistance/impaired Eating/Feeding: Modified independent;Sitting   Grooming: Min guard;Standing;Wash/dry face   Upper Body Bathing: Set up;Sitting   Lower Body Bathing: Min guard;Sit to/from stand   Upper Body Dressing : Set up;Sitting   Lower Body Dressing: Minimal assistance;Sit to/from stand   Toilet Transfer: Min guard;Minimal assistance;Ambulation;Regular Social workerToilet   Toileting- Clothing Manipulation and Hygiene: Min guard;Sit to/from stand       Functional mobility during ADLs: Min guard;Minimal assistance General ADL Comments: pt overall requiring close minguard assist for mobility; pt with one LOB when turning to transition to sitting EOB requiring minA to control descent to sitting, reports due to slight dizziness. otherwise denies dizziness during mobility. BP taken start of session seated EOB and stable     Vision Baseline Vision/History: Wears glasses Wears Glasses: Distance only Patient Visual Report: Other (comment)(dizziness intermittently) Vision Assessment?: Yes Eye Alignment: Within Functional Limits(slight ptosis of L eye) Ocular Range of Motion: Within Functional Limits Tracking/Visual Pursuits: Decreased smoothness of eye movement to LEFT superior field;Decreased smoothness of eye movement to LEFT inferior field;Decreased smoothness of eye  movement to RIGHT inferior field;Decreased smoothness of eye movement to RIGHT superior field;Impaired - to  be further tested in functional context     Perception     Praxis      Pertinent Vitals/Pain Pain Assessment: Faces Faces Pain Scale: Hurts little more Pain Location: L ankle, bil feet Pain Descriptors / Indicators: Sore;Discomfort Pain Intervention(s): Monitored during session;Repositioned     Hand Dominance     Extremity/Trunk Assessment Upper Extremity Assessment Upper Extremity Assessment: Overall WFL for tasks assessed   Lower Extremity Assessment Lower Extremity Assessment: Defer to PT evaluation       Communication Communication Communication: No difficulties   Cognition Arousal/Alertness: Awake/alert Behavior During Therapy: Flat affect;WFL for tasks assessed/performed Overall Cognitive Status: No family/caregiver present to determine baseline cognitive functioning                                 General Comments: pt overall appropriate during session; A&O x4; slightly slower processing noted, may benefit from further assessment   General Comments       Exercises     Shoulder Instructions      Home Living Family/patient expects to be discharged to:: Unsure Living Arrangements: Alone Available Help at Discharge: Other (Comment)(pt reports none)                             Additional Comments: pt reports he was most recently staying in a dorm at [employer] where he was taking classes; when initially asked about where he lives and who can assist at time of discharge pt reports "no where" and "no one" (due to holiday hours at dorm?). 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 [employer]        OT Problem List: Decreased strength;Decreased range of motion;Decreased activity tolerance;Impaired balance (sitting and/or standing)      OT Treatment/Interventions: Self-care/ADL training;Therapeutic  exercise;Therapeutic activities;Visual/perceptual remediation/compensation;Patient/family education;Balance training;Energy conservation    OT Goals(Current goals can be found in the care plan section) Acute Rehab OT Goals Patient Stated Goal: none stated this session OT Goal Formulation: With patient Time For Goal Achievement: 12/05/18 Potential to Achieve Goals: Good  OT Frequency: Min 2X/week   Barriers to D/C:            Co-evaluation              AM-PAC OT "6 Clicks" Daily Activity     Outcome Measure Help from another person eating meals?: None Help from another person taking care of personal grooming?: None Help from another person toileting, which includes using toliet, bedpan, or urinal?: None Help from another person bathing (including washing, rinsing, drying)?: A Little Help from another person to put on and taking off regular upper body clothing?: None Help from another person to put on and taking off regular lower body clothing?: A Little 6 Click Score: 22   End of Session Equipment Utilized During Treatment: Gait belt Nurse Communication: Mobility status  Activity Tolerance: Patient tolerated treatment well Patient left: in chair;with call bell/phone within reach;with chair alarm set  OT Visit Diagnosis: Unsteadiness on feet (R26.81)                Time: 1610-9604 OT Time Calculation (  min): 29 min Charges:  OT General Charges $OT Visit: 1 Visit OT Evaluation $OT Eval Moderate Complexity: 1 Mod OT Treatments $Self Care/Home Management : 8-22 mins  Marcy Siren, OT Supplemental Rehabilitation Services Pager [phone] Office [phone]   [name] [name] 11/21/2018, 10:18 AM

## 2018-11-21 NOTE — Care Management Note (Addendum)
Case Management Note  Patient Details  Name: [name] [name] MRN: [id number] Date of Birth: 1955/12/21  Subjective/Objective:     Pt in with chest pain. He states his wife is not living in [place] and he is living in his car. CM inquired about him going to [place] and he states he can not afford to go. He states they are trying to get things settled in [place] and that he has to send them money. He states he hopes to eventually get to [place]. DME: none Pt denies issues obtaining his medications.  Pt has car for transportation.               Action/Plan: No f/u per OT. Awaiting PT eval. CM provided him shelter list and encouraged him to call and try and secure a place to sleep. CM also provided him emergency resources information.  Pt states his car is at [facility]. Pt will need security to take him [facility] at d/c.  CM following.  Expected Discharge Date:  11/20/18               Expected Discharge Plan:     In-House Referral:     Discharge planning Services     Post Acute Care Choice:    Choice offered to:     DME Arranged:    DME Agency:     HH Arranged:    HH Agency:     Status of Service:  In process, will continue to follow  If discussed at Long Length of Stay Meetings, dates discussed:    Additional Comments:  Kermit Balo, RN 11/21/2018, 1:12 PM

## 2018-11-21 NOTE — Evaluation (Signed)
Physical Therapy Evaluation Patient Details Name: [name] [name] MRN: [id number] DOB: Nov 29, 1956 Today's Date: 11/21/2018   History of Present Illness  Pt is a 62 year old male with past medical history significant for Barrett's esophagus, CAD status post stent, diabetes type 2 uncontrolled, hypertension, peripheral neuropathy who presents complaining of dizziness and lightheadedness along with back pain. CT results normal and MRI negative for CVA  Clinical Impression  Pt admitted with/for dizziness and back pain.  Presently pt has improved from admission and needs min guard assist due to continued weakness..  Pt currently limited functionally due to the problems listed below.  (see problems list.)  Pt will benefit from PT to maximize function and safety to be able to get home safely with available assist.     Follow Up Recommendations No PT follow up    Equipment Recommendations  None recommended by PT    Recommendations for Other Services       Precautions / Restrictions Precautions Precautions: Fall Restrictions Weight Bearing Restrictions: No      Mobility  Bed Mobility                  Transfers Overall transfer level: Needs assistance Equipment used: None Transfers: Sit to/from Stand Sit to Stand: Min guard         General transfer comment: for safety and immediate standing balance, no physical assist required  Ambulation/Gait Ambulation/Gait assistance: Min assist Gait Distance (Feet): 200 Feet Assistive device: None(reaching for rails, counter tops) Gait Pattern/deviations: Step-through pattern;Decreased step length - right;Decreased step length - left;Decreased stride length Gait velocity: moderate Gait velocity interpretation: 1.31 - 2.62 ft/sec, indicative of limited community ambulator General Gait Details: weak, mildly unsteady gait without LOB or any overt deviation.  Stairs Stairs: Yes Stairs assistance: Min guard Stair Management: One rail  Right;Step to pattern;Alternating pattern;Forwards Number of Stairs: 4 General stair comments: safe with rail  Wheelchair Mobility    Modified Rankin (Stroke Patients Only)       Balance Overall balance assessment: Needs assistance Sitting-balance support: Feet supported Sitting balance-Leahy Scale: Good     Standing balance support: No upper extremity supported;During functional activity Standing balance-Leahy Scale: Fair Standing balance comment: minguard for balance                             Pertinent Vitals/Pain Faces Pain Scale: Hurts little more Pain Location: L ankle, bil feet Pain Descriptors / Indicators: Sore;Discomfort Pain Intervention(s): Monitored during session    Home Living Family/patient expects to be discharged to:: Unsure Living Arrangements: Alone Available Help at Discharge: Other (Comment)([place], )             Additional Comments: pt reports he is in school at [employer], has been in a dorm style living arrangement, has no where to stay during Christmas break    Prior Function Level of Independence: Independent         Comments: 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 [employer]     Hand Dominance        Extremity/Trunk Assessment                Communication   Communication: No difficulties  Cognition Arousal/Alertness: Awake/alert Behavior During Therapy: Flat affect;WFL for tasks assessed/performed Overall Cognitive Status: No family/caregiver present to determine baseline cognitive functioning  General Comments: pt overall appropriate during session; A&O x4; slightly slower processing noted, may benefit from further assessment      General Comments      Exercises     Assessment/Plan    PT Assessment Patient needs continued PT services  PT Problem List Decreased strength;Decreased activity tolerance;Decreased balance;Decreased mobility;Decreased  coordination;Decreased knowledge of precautions       PT Treatment Interventions Functional mobility training;Therapeutic activities;Gait training;Stair training;Balance training;Cognitive remediation;Patient/family education    PT Goals (Current goals can be found in the Care Plan section)  Acute Rehab PT Goals Patient Stated Goal: none stated this session PT Goal Formulation: With patient Time For Goal Achievement: 11/28/18 Potential to Achieve Goals: Good    Frequency Min 3X/week   Barriers to discharge Decreased caregiver support no specific place to go to post d/c    Co-evaluation               AM-PAC PT "6 Clicks" Mobility  Outcome Measure Help needed turning from your back to your side while in a flat bed without using bedrails?: A Little Help needed moving from lying on your back to sitting on the side of a flat bed without using bedrails?: A Little Help needed moving to and from a bed to a chair (including a wheelchair)?: A Little Help needed standing up from a chair using your arms (e.g., wheelchair or bedside chair)?: A Little Help needed to walk in hospital room?: A Little Help needed climbing 3-5 steps with a railing? : A Little 6 Click Score: 18    End of Session   Activity Tolerance: Patient tolerated treatment well Patient left: in chair;with call bell/phone within reach;with chair alarm set Nurse Communication: Mobility status PT Visit Diagnosis: Unsteadiness on feet (R26.81);Muscle weakness (generalized) (M62.81)    Time: 2863-8177 PT Time Calculation (min) (ACUTE ONLY): 20 min   Charges:   PT Evaluation $PT Eval Moderate Complexity: 1 Mod          11/21/2018  Loving Bing, PT Acute Rehabilitation Services [phone]  (pager) [phone]  (office)  [name] [name] 11/21/2018, 4:49 PM

## 2018-11-21 NOTE — Progress Notes (Signed)
Inpatient Diabetes Program Recommendations  AACE/ADA: New Consensus Statement on Inpatient Glycemic Control (2015)  Target Ranges:  Prepandial:   less than 140 mg/dL      Peak postprandial:   less than 180 mg/dL (1-2 hours)      Critically ill patients:  140 - 180 mg/dL   Lab Results  Component Value Date   GLUCAP 123 (H) 11/21/2018   HGBA1C 8.2 (H) 11/20/2018    Review of Glycemic Control Results for [name] [name], [name] [name] (MRN [id number]) as of 11/21/2018 10:57  Ref. Range 11/20/2018 17:10 11/20/2018 18:32 11/20/2018 19:14 11/20/2018 21:30 11/21/2018 06:34  Glucose-Capillary Latest Ref Range: 70 - 99 mg/dL 77 34 (LL) 89 90 244 (H)   Diabetes history: DM2 Outpatient Diabetes medications: Levemir 45 units am + 40 units pm + Novolog 15-30 units tid Current orders for Inpatient glycemic control: Levemir 45 units + Novolog moderate correction + hs  Inpatient Diabetes Program Recommendations:   -Decrease Levemir to 20 units bid -Decrease Novolog correction to sensitive tid  Thank you, [name]. [name], RN, MSN, CDE  Diabetes Coordinator Inpatient Glycemic Control Team Team Pager [phone] (8am-5pm) 11/21/2018 10:58 AM

## 2018-11-22 ENCOUNTER — Other Ambulatory Visit: Payer: Self-pay | Admitting: Cardiology

## 2018-11-22 DIAGNOSIS — G459 Transient cerebral ischemic attack, unspecified: Secondary | ICD-10-CM | POA: Diagnosis not present

## 2018-11-22 DIAGNOSIS — R0789 Other chest pain: Secondary | ICD-10-CM | POA: Diagnosis not present

## 2018-11-22 DIAGNOSIS — R42 Dizziness and giddiness: Secondary | ICD-10-CM | POA: Diagnosis not present

## 2018-11-22 DIAGNOSIS — R569 Unspecified convulsions: Secondary | ICD-10-CM | POA: Diagnosis not present

## 2018-11-22 DIAGNOSIS — E782 Mixed hyperlipidemia: Secondary | ICD-10-CM | POA: Diagnosis not present

## 2018-11-22 DIAGNOSIS — I2510 Atherosclerotic heart disease of native coronary artery without angina pectoris: Secondary | ICD-10-CM | POA: Diagnosis not present

## 2018-11-22 DIAGNOSIS — I208 Other forms of angina pectoris: Secondary | ICD-10-CM

## 2018-11-22 LAB — GLUCOSE, CAPILLARY
Glucose-Capillary: 136 mg/dL — ABNORMAL HIGH (ref 70–99)
Glucose-Capillary: 330 mg/dL — ABNORMAL HIGH (ref 70–99)

## 2018-11-22 MED ORDER — LEVETIRACETAM 500 MG PO TABS: 500.0000 mg | ORAL_TABLET | Freq: Two times a day (BID) | ORAL | 0 refills | Status: DC

## 2018-11-22 NOTE — Progress Notes (Signed)
Patient ready for discharge; discharge instructions given and reviewed; Rx given; patient discharged out via wheelchair; patient was transported by security to the [facility] campus to get belongings from his parked car; he will utilize his bus pass from that location.

## 2018-11-22 NOTE — Clinical Social Work Note (Signed)
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 He has been staying in his car since campus is closed for holiday. Patient has new driving restrictions. RNCM gave shelter resources yesterday. [employer] is closed for the holiday. He left a message at [employer]. They called him back this morning but he missed the call so he left another message. CSW left a message as well. He confirmed his family is in [place]. He does not know his church family well enough to go stay with him. He has income but has used it all on Psychologist, occupational. He does not get paid again until 1/3 and the dorms do not open back up until 1/6. Gave patient three bus passes and a cab voucher. RN aware.  CSW signing off. Consult again if any other social work needs arise.  Charlynn Court, CSW [phone]

## 2018-11-22 NOTE — Progress Notes (Signed)
EEG read available - no abnormalities.  Plan as in the consult note. No new recs.  Please call neurology with questions.  -- Milon Dikes, MD [facility] Pager: [phone] If 7pm to 7am, please call on call as listed on AMION.

## 2018-11-22 NOTE — Discharge Summary (Signed)
PATIENT DETAILS Name: [name] [name] Age: 62 y.o. Sex: male Date of Birth: 14-Sep-1956 MRN: [id number]. Admitting Physician: Merlene Laughter, DO WUJ:WJXB, Levell July, MD  Admit Date: 11/19/2018 Discharge date: 11/22/2018  Recommendations for Outpatient Follow-up:  1. Follow up with PCP in 1-2 weeks 2. Please obtain BMP/CBC in one week 3. Please ensure follow-up with cardiology and neurology.  Admitted From:  Home  Disposition: Home   Home Health: No  Equipment/Devices: None  Discharge Condition: Stable  CODE STATUS: FULL CODE  Diet recommendation:  Heart Healthy / Carb Modified   Brief Summary: See H&P, Labs, Consult and Test reports for all details in brief,Patient is a 62 y.o. male with history of CAD status post PCI in 2015, DM-2, hypertension, peripheral neuropathy who presented to the ED for evaluation of dizziness,, questionable syncope, chest pain.  Patient was subsequently admitted to the hospitalist service for further evaluation and treatment  Brief Hospital Course: Chest pain: Resolved-echocardiogram with preserved EF-spoke with cardiologist-Dr. Richardean Chimera not advise any further inpatient work-up, they will arrange for outpatient follow-up with his primary cardiologist.  ?  Syncope/dizziness: Telemetry unremarkable-Echo with preserved EF.  No further work-up recommended by cardiology.  Could have been related to possible seizures  Possible focal seizures: Neurology following-no further left arm jerking movement this morning-EEG negative, MRI brain without acute CVA.  Continue Keppra.  No driving and other seizure precautions for at least 6 months (see below).  No further recommendations from neurology-okay to discharge, have sent a epic referral for outpatient neurology follow-up.  TIA vs seizures: Did have a left facial droop on 12/22 that has since resolved-transferred to [facility] for neurology evaluation.  MRI brain without any ischemic foci.   Carotid Doppler without any significant stenosis.  Echo with preserved EF.  Telemetry without A. fib.  A1c 8.2.  LDL 68.  Continue aspirin and statin.    History of CAD: Underwent PCI in 2015-troponins were negative.  Spoke with Dr. Rennis Golden on 12/24-it seems that patient has been lost to follow-up with his primary cardiologist for the past few years-Dr. Rennis Golden advises that we discontinue the patient on aspirin-Effient can be discontinued.  Continue beta-blocker and statin.  An outpatient appointment with primary cardiology has been set up.  Insulin-dependent DM: CBG electively stable-however one reading at 300 this morning.  Continue Levemir 45 units every morning-continue SSI.  Will follow and adjust accordingly.  Hypertension: Stable-continue metoprolol, lisinopril.  GERD: Continue PPI  Obesity: Counseled regarding importance of weight loss  Procedures/Studies: None  Discharge Diagnoses:  Principal Problem:   Chest pain, atypical Active Problems:   DM (diabetes mellitus), type 2, uncontrolled (HCC)   Peripheral neuropathy   CAD (coronary artery disease)   Hyperlipidemia   Uncontrolled diabetes mellitus type 2 with peripheral artery disease (HCC)   Old myocardial infarction   Dizziness   Discharge Instructions:  Activity:  As tolerated with Full fall precautions use walker/cane & assistance as needed   Discharge Instructions    Ambulatory referral to Neurology   Complete by:  As directed    An appointment is requested in approximately: 2 weeks   Call MD for:  persistant nausea and vomiting   Complete by:  As directed    Call MD for:  severe uncontrolled pain   Complete by:  As directed    Diet - low sodium heart healthy   Complete by:  As directed    Diet Carb Modified   Complete by:  As directed  Discharge instructions   Complete by:  As directed    Follow with Primary MD  Sherren Mocha, MD in 1 week  Please reestablish care with Dr. Eldridge Dace   (cardiologist)  Per [facility] statutes, patients with seizures are not allowed to drive until they have been seizure-free for six months. Use caution when using heavy equipment or power tools. Avoid working on ladders or at heights. Take showers instead of baths. Ensure the water temperature is not too high on the home water heater. Do not go swimming alone. When caring for infants or small children, sit down when holding, feeding, or changing them to minimize risk of injury to the child in the event you have a seizure.  Please get a complete blood count and chemistry panel checked by your Primary MD at your next visit, and again as instructed by your Primary MD.  Get Medicines reviewed and adjusted: Please take all your medications with you for your next visit with your Primary MD  Laboratory/radiological data: Please request your Primary MD to go over all hospital tests and procedure/radiological results at the follow up, please ask your Primary MD to get all Hospital records sent to his/her office.  In some cases, they will be blood work, cultures and biopsy results pending at the time of your discharge. Please request that your primary care M.D. follows up on these results.  Also Note the following: If you experience worsening of your admission symptoms, develop shortness of breath, life threatening emergency, suicidal or homicidal thoughts you must seek medical attention immediately by calling 911 or calling your MD immediately  if symptoms less severe.  You must read complete instructions/literature along with all the possible adverse reactions/side effects for all the Medicines you take and that have been prescribed to you. Take any new Medicines after you have completely understood and accpet all the possible adverse reactions/side effects.   Do not drive when taking Pain medications or sleeping medications (Benzodaizepines)  Do not take more than prescribed Pain, Sleep and  Anxiety Medications. It is not advisable to combine anxiety,sleep and pain medications without talking with your primary care practitioner  Special Instructions: If you have smoked or chewed Tobacco  in the last 2 yrs please stop smoking, stop any regular Alcohol  and or any Recreational drug use.  Wear Seat belts while driving.  Please note: You were cared for by a hospitalist during your hospital stay. Once you are discharged, your primary care physician will handle any further medical issues. Please note that NO REFILLS for any discharge medications will be authorized once you are discharged, as it is imperative that you return to your primary care physician (or establish a relationship with a primary care physician if you do not have one) for your post hospital discharge needs so that they can reassess your need for medications and monitor your lab values.   Driving Restrictions   Complete by:  As directed     Per [facility] statutes, patients with seizures are not allowed to drive until they have been seizure-free for six months. Use caution when using heavy equipment or power tools. Avoid working on ladders or at heights. Take showers instead of baths. Ensure the water temperature is not too high on the home water heater. Do not go swimming alone. When caring for infants or small children, sit down when holding, feeding, or changing them to minimize risk of injury to the child in the event you have  a seizure.   Increase activity slowly   Complete by:  As directed      Allergies as of 11/22/2018   No Known Allergies     Medication List    STOP taking these medications   prasugrel 10 MG Tabs tablet Commonly known as:  EFFIENT     TAKE these medications   EQ ASPIRIN ADULT LOW DOSE 81 MG EC tablet Generic drug:  aspirin TAKE ONE TABLET BY MOUTH ONCE DAILY   furosemide 20 MG tablet Commonly known as:  LASIX Take 1 tablet (20 mg total) by mouth daily.   gabapentin 100 MG  capsule Commonly known as:  NEURONTIN TAKE 2 CAPSULES BY MOUTH EVERY MORNING AND AT BEDTIME What changed:    how much to take  how to take this  when to take this   HYDROcodone-acetaminophen 7.5-325 MG tablet Commonly known as:  NORCO Take 1 tablet by mouth every 6 (six) hours as needed for moderate pain.   ibuprofen 400 MG tablet Commonly known as:  ADVIL,MOTRIN Take 1 tablet (400 mg total) by mouth every 6 (six) hours as needed.   insulin aspart 100 UNIT/ML FlexPen Commonly known as:  NOVOLOG FLEXPEN INJECT 30 TO 35 UNITS SUBCUTANEOUSLY THREE TIMES DAILY What changed:    how much to take  how to take this  when to take this   LEVEMIR FLEXTOUCH 100 UNIT/ML Pen Generic drug:  Insulin Detemir INJECT 45 UNITS UNDER THE SKIN EVERY MORNING AND 40 UNITS EVERY NIGHT AT BEDTIME What changed:    how much to take  how to take this  when to take this   levETIRAcetam 500 MG tablet Commonly known as:  KEPPRA Take 1 tablet (500 mg total) by mouth 2 (two) times daily.   lisinopril 2.5 MG tablet Commonly known as:  PRINIVIL,ZESTRIL TAKE 1 TABLET(2.5 MG) BY MOUTH DAILY What changed:  See the new instructions.   metoprolol tartrate 25 MG tablet Commonly known as:  LOPRESSOR TAKE 1/2 TABLET(12.5 MG) BY MOUTH TWICE DAILY What changed:  See the new instructions.   nitroGLYCERIN 0.4 MG SL tablet Commonly known as:  NITROSTAT Place 1 tablet (0.4 mg total) under the tongue every 5 (five) minutes as needed for chest pain (up to 3 doses).   pantoprazole 40 MG tablet Commonly known as:  PROTONIX TAKE 1 TABLET BY MOUTH EVERY DAY   rosuvastatin 40 MG tablet Commonly known as:  CRESTOR TAKE 1 TABLET(40 MG) BY MOUTH DAILY What changed:  See the new instructions.      Follow-up Information    Laurann Montana, PA-C Follow up on 12/20/2018.   Specialties:  Cardiology, Radiology Why:  Your follow up appointment will be on 12/20/2018 at 130pm  Contact information: [address] [place] [address] [phone]        Sherren Mocha, MD. Schedule an appointment as soon as possible for a visit in 1 week(s).   Specialty:  Family Medicine Contact information: [address] [place] [address] [phone]        Corky Crafts, MD .   Specialties:  Cardiology, Radiology, Interventional Cardiology Contact information: [address] [place] [address] [phone]        [facility] Follow up.   Why:  office will call you with a follow-up appointment, if you do not hear from them-please give them a call in 1 week Contact information: [address]  [address] [place] [address] [phone]         No Known Allergies  Consultations:   cardiology and neurology  Other Procedures/Studies: Ct Angio Head W Or Wo Contrast  Result Date: 11/20/2018 CLINICAL DATA:  Dizziness and lightheadedness. Weakness. Symptoms began yesterday. EXAM: CT ANGIOGRAPHY HEAD AND NECK TECHNIQUE: Multidetector CT imaging of the head and neck was performed using the standard protocol during bolus administration of intravenous contrast. Multiplanar CT image reconstructions and MIPs were obtained to evaluate the vascular anatomy. Carotid stenosis measurements (when applicable) are obtained utilizing NASCET criteria, using the distal internal carotid diameter as the denominator. CONTRAST:  80mL ISOVUE-370 IOPAMIDOL (ISOVUE-370) INJECTION 76% COMPARISON:  MRI and CT studies done yesterday. FINDINGS: CT HEAD FINDINGS Brain: Mild age related atrophy. No evidence of old or acute infarction, mass lesion, hemorrhage, hydrocephalus or extra-axial collection. Vascular: Minor calcification at the base of the brain. Skull: Normal Sinuses: Clear Orbits: Normal Review of the MIP images confirms the above findings CTA NECK FINDINGS Aortic arch: Normal Right carotid system: Common carotid artery widely  patent to the bifurcation. Carotid bifurcation is normal without soft or calcified plaque. Cervical ICA is normal. Left carotid system: Common carotid artery widely patent to the bifurcation. Carotid bifurcation is normal without soft or calcified plaque. Cervical ICA is normal. Vertebral arteries: Both vertebral arteries are widely patent at their origins, though detail is limited on the right because of streak artifact. Both vertebral arteries appear patent through the cervical region to the foramen magnum. Skeleton: Mid cervical spondylosis. Ordinary cervical facet arthropathy. Other neck: No mass or lymphadenopathy. Upper chest: Negative Review of the MIP images confirms the above findings CTA HEAD FINDINGS Anterior circulation: Internal carotid arteries are widely patent through the skull base and siphon regions. The anterior and middle cerebral vessels are patent without proximal stenosis, aneurysm or vascular malformation. No embolic occlusions are identified. Posterior circulation: Both vertebral arteries are widely patent to the basilar. No basilar stenosis. Posterior circulation vessels appear normal without evidence of embolic occlusion. Venous sinuses: Patent and normal. Anatomic variants: None significant. Delayed phase: No abnormal enhancement. Review of the MIP images confirms the above findings IMPRESSION: Normal CT angiography of the neck and head. Electronically Signed   By: Paulina Fusi M.D.   On: 11/20/2018 16:45   Dg Chest 2 View  Result Date: 11/20/2018 CLINICAL DATA:  Dizziness, lightheadedness, back pain, history GERD, coronary artery disease, type II diabetes mellitus, hypertension EXAM: CHEST - 2 VIEW COMPARISON:  09/29/2017 FINDINGS: Minimal enlargement of cardiac silhouette. Mediastinal contours and pulmonary vascularity normal. Lungs clear. No pleural effusion or pneumothorax. Bones unremarkable. IMPRESSION: No acute abnormalities. Electronically Signed   By: Ulyses Southward M.D.   On:  11/20/2018 16:39   Ct Head Wo Contrast  Result Date: 11/19/2018 CLINICAL DATA:  Syncopal episode last night, dizziness, history heart disease, type II diabetes mellitus, coronary artery disease post MI, hypertension, former smoker EXAM: CT HEAD WITHOUT CONTRAST TECHNIQUE: Contiguous axial images were obtained from the base of the skull through the vertex without intravenous contrast. Sagittal and coronal MPR images reconstructed from axial data set. COMPARISON:  09/06/2018 FINDINGS: Brain: Normal ventricular morphology. No midline shift or mass effect. Normal appearance of brain parenchyma. No intracranial hemorrhage, mass lesion, evidence of acute infarction, or extra-axial fluid collection. Vascular: No hyperdense vessels Skull: Intact, unremarkable Sinuses/Orbits: Clear Other: N/A IMPRESSION: Normal exam. Electronically Signed   By: Ulyses Southward M.D.   On: 11/19/2018 11:53   Ct Angio  Neck W Or Wo Contrast  Result Date: 11/20/2018 CLINICAL DATA:  Dizziness and lightheadedness. Weakness. Symptoms began yesterday. EXAM: CT ANGIOGRAPHY HEAD AND NECK TECHNIQUE: Multidetector CT imaging of the head and neck was performed using the standard protocol during bolus administration of intravenous contrast. Multiplanar CT image reconstructions and MIPs were obtained to evaluate the vascular anatomy. Carotid stenosis measurements (when applicable) are obtained utilizing NASCET criteria, using the distal internal carotid diameter as the denominator. CONTRAST:  80mL ISOVUE-370 IOPAMIDOL (ISOVUE-370) INJECTION 76% COMPARISON:  MRI and CT studies done yesterday. FINDINGS: CT HEAD FINDINGS Brain: Mild age related atrophy. No evidence of old or acute infarction, mass lesion, hemorrhage, hydrocephalus or extra-axial collection. Vascular: Minor calcification at the base of the brain. Skull: Normal Sinuses: Clear Orbits: Normal Review of the MIP images confirms the above findings CTA NECK FINDINGS Aortic arch: Normal Right  carotid system: Common carotid artery widely patent to the bifurcation. Carotid bifurcation is normal without soft or calcified plaque. Cervical ICA is normal. Left carotid system: Common carotid artery widely patent to the bifurcation. Carotid bifurcation is normal without soft or calcified plaque. Cervical ICA is normal. Vertebral arteries: Both vertebral arteries are widely patent at their origins, though detail is limited on the right because of streak artifact. Both vertebral arteries appear patent through the cervical region to the foramen magnum. Skeleton: Mid cervical spondylosis. Ordinary cervical facet arthropathy. Other neck: No mass or lymphadenopathy. Upper chest: Negative Review of the MIP images confirms the above findings CTA HEAD FINDINGS Anterior circulation: Internal carotid arteries are widely patent through the skull base and siphon regions. The anterior and middle cerebral vessels are patent without proximal stenosis, aneurysm or vascular malformation. No embolic occlusions are identified. Posterior circulation: Both vertebral arteries are widely patent to the basilar. No basilar stenosis. Posterior circulation vessels appear normal without evidence of embolic occlusion. Venous sinuses: Patent and normal. Anatomic variants: None significant. Delayed phase: No abnormal enhancement. Review of the MIP images confirms the above findings IMPRESSION: Normal CT angiography of the neck and head. Electronically Signed   By: Paulina Fusi M.D.   On: 11/20/2018 16:45   Mr Laqueta Jean WU Contrast  Result Date: 11/19/2018 CLINICAL DATA:  Dizziness. Lightheaded. Weakness beginning last night EXAM: MRI HEAD WITHOUT AND WITH CONTRAST TECHNIQUE: Multiplanar, multiecho pulse sequences of the brain and surrounding structures were obtained without and with intravenous contrast. CONTRAST:  10 cc Gadavist COMPARISON:  Head CT same day. FINDINGS: Brain: Mild age related volume loss. No evidence of old or acute small  or large vessel infarction, mass lesion, hemorrhage, hydrocephalus or extra-axial collection. Vascular: Major vessels at the base of the brain show flow. Skull and upper cervical spine: Negative Sinuses/Orbits: Clear/normal Other: None IMPRESSION: Mild age related volume loss.  No acute or focal finding. Electronically Signed   By: Paulina Fusi M.D.   On: 11/19/2018 17:29   Vas US Carotid (at [facility] Only)  Result Date: 11/20/2018 Carotid Arterial Duplex Study Indications: Syncope. Performing Technologist: Chanda Busing RVT  Examination Guidelines: A complete evaluation includes B-mode imaging, spectral Doppler, color Doppler, and power Doppler as needed of all accessible portions of each vessel. Bilateral testing is considered an integral part of a complete examination. Limited examinations for reoccurring indications may be performed as noted.  Right Carotid Findings: +----------+--------+--------+--------+-----------------------+--------+           PSV cm/sEDV cm/sStenosisDescribe               Comments +----------+--------+--------+--------+-----------------------+--------+ CCA  Prox  132     17              smooth and heterogenous         +----------+--------+--------+--------+-----------------------+--------+ CCA Distal118     24              smooth and heterogenous         +----------+--------+--------+--------+-----------------------+--------+ ICA Prox  107     13                                              +----------+--------+--------+--------+-----------------------+--------+ ICA Distal87      27                                     tortuous +----------+--------+--------+--------+-----------------------+--------+ ECA       157     17                                              +----------+--------+--------+--------+-----------------------+--------+ +----------+--------+-------+--------+-------------------+           PSV cm/sEDV cmsDescribeArm  Pressure (mmHG) +----------+--------+-------+--------+-------------------+ [id number]                                        +----------+--------+-------+--------+-------------------+ +---------+--------+--+--------+--+---------+ VertebralPSV cm/s64EDV cm/s13Antegrade +---------+--------+--+--------+--+---------+  Left Carotid Findings: +----------+--------+--------+--------+-----------------------+--------+           PSV cm/sEDV cm/sStenosisDescribe               Comments +----------+--------+--------+--------+-----------------------+--------+ CCA Prox  154     16              smooth and heterogenous         +----------+--------+--------+--------+-----------------------+--------+ CCA Distal122     21              smooth and heterogenous         +----------+--------+--------+--------+-----------------------+--------+ ICA Prox  117     20              smooth and heterogenous         +----------+--------+--------+--------+-----------------------+--------+ ICA Distal102     27                                              +----------+--------+--------+--------+-----------------------+--------+ ECA       129     10                                              +----------+--------+--------+--------+-----------------------+--------+ +----------+--------+--------+--------+-------------------+ SubclavianPSV cm/sEDV cm/sDescribeArm Pressure (mmHG) +----------+--------+--------+--------+-------------------+           172                                         +----------+--------+--------+--------+-------------------+ +---------+--------+--+--------+--+---------+ VertebralPSV cm/s69EDV cm/s13Antegrade +---------+--------+--+--------+--+---------+  Summary: Right Carotid: Velocities in the right ICA are consistent with a 1-39% stenosis. Left Carotid: Velocities in the left ICA are consistent with a 1-39% stenosis. Vertebrals: Bilateral vertebral  arteries demonstrate antegrade flow. *See table(s) above for measurements and observations.  Electronically signed by Sherald Hess MD on 11/20/2018 at 10:42:12 AM.    Final       TODAY-DAY OF DISCHARGE:  Subjective:   [name] [name] today has no headache,no chest abdominal pain,no new weakness tingling or numbness, feels much better wants to go home today.   Objective:   Blood pressure (!) 155/89, pulse 84, temperature 98.6 F (37 C), temperature source Oral, resp. rate 20, height 5\' 9"  (1.753 m), weight 107.1 kg, SpO2 99 %.  Intake/Output Summary (Last 24 hours) at 11/22/2018 1132 Last data filed at 11/22/2018 0519 Gross per 24 hour  Intake 720 ml  Output 2350 ml  Net -1630 ml   Filed Weights   11/19/18 1616  Weight: 107.1 kg    Exam: Awake Alert, Oriented *3, No new F.N deficits, Normal affect [place].AT,PERRAL Supple Neck,No JVD, No cervical lymphadenopathy appriciated.  Symmetrical Chest wall movement, Good air movement bilaterally, CTAB RRR,No Gallops,Rubs or new Murmurs, No Parasternal Heave +ve B.Sounds, Abd Soft, Non tender, No organomegaly appriciated, No rebound -guarding or rigidity. No Cyanosis, Clubbing or edema, No new Rash or bruise   PERTINENT RADIOLOGIC STUDIES: Ct Angio Head W Or Wo Contrast  Result Date: 11/20/2018 CLINICAL DATA:  Dizziness and lightheadedness. Weakness. Symptoms began yesterday. EXAM: CT ANGIOGRAPHY HEAD AND NECK TECHNIQUE: Multidetector CT imaging of the head and neck was performed using the standard protocol during bolus administration of intravenous contrast. Multiplanar CT image reconstructions and MIPs were obtained to evaluate the vascular anatomy. Carotid stenosis measurements (when applicable) are obtained utilizing NASCET criteria, using the distal internal carotid diameter as the denominator. CONTRAST:  80mL ISOVUE-370 IOPAMIDOL (ISOVUE-370) INJECTION 76% COMPARISON:  MRI and CT studies done yesterday. FINDINGS: CT HEAD FINDINGS  Brain: Mild age related atrophy. No evidence of old or acute infarction, mass lesion, hemorrhage, hydrocephalus or extra-axial collection. Vascular: Minor calcification at the base of the brain. Skull: Normal Sinuses: Clear Orbits: Normal Review of the MIP images confirms the above findings CTA NECK FINDINGS Aortic arch: Normal Right carotid system: Common carotid artery widely patent to the bifurcation. Carotid bifurcation is normal without soft or calcified plaque. Cervical ICA is normal. Left carotid system: Common carotid artery widely patent to the bifurcation. Carotid bifurcation is normal without soft or calcified plaque. Cervical ICA is normal. Vertebral arteries: Both vertebral arteries are widely patent at their origins, though detail is limited on the right because of streak artifact. Both vertebral arteries appear patent through the cervical region to the foramen magnum. Skeleton: Mid cervical spondylosis. Ordinary cervical facet arthropathy. Other neck: No mass or lymphadenopathy. Upper chest: Negative Review of the MIP images confirms the above findings CTA HEAD FINDINGS Anterior circulation: Internal carotid arteries are widely patent through the skull base and siphon regions. The anterior and middle cerebral vessels are patent without proximal stenosis, aneurysm or vascular malformation. No embolic occlusions are identified. Posterior circulation: Both vertebral arteries are widely patent to the basilar. No basilar stenosis. Posterior circulation vessels appear normal without evidence of embolic occlusion. Venous sinuses: Patent and normal. Anatomic variants: None significant. Delayed phase: No abnormal enhancement. Review of the MIP images confirms the above findings IMPRESSION: Normal CT angiography of the neck and head. Electronically Signed   By: Scherrie Bateman.D.  On: 11/20/2018 16:45   Dg Chest 2 View  Result Date: 11/20/2018 CLINICAL DATA:  Dizziness, lightheadedness, back pain, history  GERD, coronary artery disease, type II diabetes mellitus, hypertension EXAM: CHEST - 2 VIEW COMPARISON:  09/29/2017 FINDINGS: Minimal enlargement of cardiac silhouette. Mediastinal contours and pulmonary vascularity normal. Lungs clear. No pleural effusion or pneumothorax. Bones unremarkable. IMPRESSION: No acute abnormalities. Electronically Signed   By: Ulyses Southward M.D.   On: 11/20/2018 16:39   Ct Head Wo Contrast  Result Date: 11/19/2018 CLINICAL DATA:  Syncopal episode last night, dizziness, history heart disease, type II diabetes mellitus, coronary artery disease post MI, hypertension, former smoker EXAM: CT HEAD WITHOUT CONTRAST TECHNIQUE: Contiguous axial images were obtained from the base of the skull through the vertex without intravenous contrast. Sagittal and coronal MPR images reconstructed from axial data set. COMPARISON:  09/06/2018 FINDINGS: Brain: Normal ventricular morphology. No midline shift or mass effect. Normal appearance of brain parenchyma. No intracranial hemorrhage, mass lesion, evidence of acute infarction, or extra-axial fluid collection. Vascular: No hyperdense vessels Skull: Intact, unremarkable Sinuses/Orbits: Clear Other: N/A IMPRESSION: Normal exam. Electronically Signed   By: Ulyses Southward M.D.   On: 11/19/2018 11:53   Ct Angio Neck W Or Wo Contrast  Result Date: 11/20/2018 CLINICAL DATA:  Dizziness and lightheadedness. Weakness. Symptoms began yesterday. EXAM: CT ANGIOGRAPHY HEAD AND NECK TECHNIQUE: Multidetector CT imaging of the head and neck was performed using the standard protocol during bolus administration of intravenous contrast. Multiplanar CT image reconstructions and MIPs were obtained to evaluate the vascular anatomy. Carotid stenosis measurements (when applicable) are obtained utilizing NASCET criteria, using the distal internal carotid diameter as the denominator. CONTRAST:  80mL ISOVUE-370 IOPAMIDOL (ISOVUE-370) INJECTION 76% COMPARISON:  MRI and CT studies  done yesterday. FINDINGS: CT HEAD FINDINGS Brain: Mild age related atrophy. No evidence of old or acute infarction, mass lesion, hemorrhage, hydrocephalus or extra-axial collection. Vascular: Minor calcification at the base of the brain. Skull: Normal Sinuses: Clear Orbits: Normal Review of the MIP images confirms the above findings CTA NECK FINDINGS Aortic arch: Normal Right carotid system: Common carotid artery widely patent to the bifurcation. Carotid bifurcation is normal without soft or calcified plaque. Cervical ICA is normal. Left carotid system: Common carotid artery widely patent to the bifurcation. Carotid bifurcation is normal without soft or calcified plaque. Cervical ICA is normal. Vertebral arteries: Both vertebral arteries are widely patent at their origins, though detail is limited on the right because of streak artifact. Both vertebral arteries appear patent through the cervical region to the foramen magnum. Skeleton: Mid cervical spondylosis. Ordinary cervical facet arthropathy. Other neck: No mass or lymphadenopathy. Upper chest: Negative Review of the MIP images confirms the above findings CTA HEAD FINDINGS Anterior circulation: Internal carotid arteries are widely patent through the skull base and siphon regions. The anterior and middle cerebral vessels are patent without proximal stenosis, aneurysm or vascular malformation. No embolic occlusions are identified. Posterior circulation: Both vertebral arteries are widely patent to the basilar. No basilar stenosis. Posterior circulation vessels appear normal without evidence of embolic occlusion. Venous sinuses: Patent and normal. Anatomic variants: None significant. Delayed phase: No abnormal enhancement. Review of the MIP images confirms the above findings IMPRESSION: Normal CT angiography of the neck and head. Electronically Signed   By: Paulina Fusi M.D.   On: 11/20/2018 16:45   Mr Laqueta Jean QI Contrast  Result Date: 11/19/2018 CLINICAL  DATA:  Dizziness. Lightheaded. Weakness beginning last night EXAM: MRI HEAD WITHOUT AND WITH  CONTRAST TECHNIQUE: Multiplanar, multiecho pulse sequences of the brain and surrounding structures were obtained without and with intravenous contrast. CONTRAST:  10 cc Gadavist COMPARISON:  Head CT same day. FINDINGS: Brain: Mild age related volume loss. No evidence of old or acute small or large vessel infarction, mass lesion, hemorrhage, hydrocephalus or extra-axial collection. Vascular: Major vessels at the base of the brain show flow. Skull and upper cervical spine: Negative Sinuses/Orbits: Clear/normal Other: None IMPRESSION: Mild age related volume loss.  No acute or focal finding. Electronically Signed   By: Paulina FusiMark  Shogry M.D.   On: 11/19/2018 17:29   [facility] [place] [facility]  Result Date: 11/20/2018 Carotid Arterial Duplex Study Indications: Syncope. Performing Technologist: Chanda BusingGregory Collins RVT  Examination Guidelines: A complete evaluation includes B-mode imaging, spectral Doppler, color Doppler, and power Doppler as needed of all accessible portions of each vessel. Bilateral testing is considered an integral part of a complete examination. Limited examinations for reoccurring indications may be performed as noted.  Right Carotid Findings: +----------+--------+--------+--------+-----------------------+--------+           PSV cm/sEDV cm/sStenosisDescribe               Comments +----------+--------+--------+--------+-----------------------+--------+ CCA Prox  132     17              smooth and heterogenous         +----------+--------+--------+--------+-----------------------+--------+ CCA Distal118     24              smooth and heterogenous         +----------+--------+--------+--------+-----------------------+--------+ ICA Prox  107     13                                              +----------+--------+--------+--------+-----------------------+--------+ ICA Distal87       27                                     tortuous +----------+--------+--------+--------+-----------------------+--------+ ECA       157     17                                              +----------+--------+--------+--------+-----------------------+--------+ +----------+--------+-------+--------+-------------------+           PSV cm/sEDV cmsDescribeArm Pressure (mmHG) +----------+--------+-------+--------+-------------------+ [id number]                                        +----------+--------+-------+--------+-------------------+ +---------+--------+--+--------+--+---------+ VertebralPSV cm/s64EDV cm/s13Antegrade +---------+--------+--+--------+--+---------+  Left Carotid Findings: +----------+--------+--------+--------+-----------------------+--------+           PSV cm/sEDV cm/sStenosisDescribe               Comments +----------+--------+--------+--------+-----------------------+--------+ CCA Prox  154     16              smooth and heterogenous         +----------+--------+--------+--------+-----------------------+--------+ CCA Distal122     21              smooth and heterogenous         +----------+--------+--------+--------+-----------------------+--------+ ICA Prox  117     20              smooth and heterogenous         +----------+--------+--------+--------+-----------------------+--------+ ICA Distal102     27                                              +----------+--------+--------+--------+-----------------------+--------+ ECA       129     10                                              +----------+--------+--------+--------+-----------------------+--------+ +----------+--------+--------+--------+-------------------+ SubclavianPSV cm/sEDV cm/sDescribeArm Pressure (mmHG) +----------+--------+--------+--------+-------------------+           172                                          +----------+--------+--------+--------+-------------------+ +---------+--------+--+--------+--+---------+ VertebralPSV cm/s69EDV cm/s13Antegrade +---------+--------+--+--------+--+---------+  Summary: Right Carotid: Velocities in the right ICA are consistent with a 1-39% stenosis. Left Carotid: Velocities in the left ICA are consistent with a 1-39% stenosis. Vertebrals: Bilateral vertebral arteries demonstrate antegrade flow. *See table(s) above for measurements and observations.  Electronically signed by Sherald Hess MD on 11/20/2018 at 10:42:12 AM.    Final      PERTINENT LAB RESULTS: CBC: Recent Labs    11/20/18 0741  WBC 6.5  HGB 12.3*  HCT 38.1*  PLT 287   CMET CMP     Component Value Date/Time   NA 136 11/20/2018 0741   NA 140 03/11/2018 1606   K 4.2 11/20/2018 0741   CL 103 11/20/2018 0741   CO2 21 (L) 11/20/2018 0741   GLUCOSE 227 (H) 11/20/2018 0741   BUN 18 11/20/2018 0741   BUN 12 03/11/2018 1606   CREATININE 1.24 11/20/2018 0741   CREATININE 1.10 08/25/2016 1642   CALCIUM 8.6 (L) 11/20/2018 0741   PROT 6.1 (L) 11/12/2018 0529   PROT 6.4 03/11/2018 1606   ALBUMIN 3.7 11/12/2018 0529   ALBUMIN 4.2 03/11/2018 1606   AST 32 11/12/2018 0529   ALT 21 11/12/2018 0529   ALKPHOS 107 11/12/2018 0529   BILITOT 0.4 11/12/2018 0529   BILITOT 0.3 03/11/2018 1606   GFRNONAA >60 11/20/2018 0741   GFRNONAA >89 08/31/2014 1627   GFRAA >60 11/20/2018 0741   GFRAA >89 08/31/2014 1627    GFR Estimated Creatinine Clearance: 76.4 mL/min (by C-G formula based on SCr of 1.24 mg/dL). No results for input(s): LIPASE, AMYLASE in the last 72 hours. Recent Labs    11/19/18 1632 11/19/18 1826  TROPONINI <0.03 <0.03   Invalid input(s): POCBNP No results for input(s): DDIMER in the last 72 hours. Recent Labs    11/20/18 0455  HGBA1C 8.2*   Recent Labs    11/20/18 0455  CHOL 144  HDL 41  LDLCALC 68  TRIG 173*  CHOLHDL 3.5   No results for input(s): TSH,  T4TOTAL, T3FREE, THYROIDAB in the last 72 hours.  Invalid input(s): FREET3 No results for input(s): VITAMINB12, FOLATE, FERRITIN, TIBC, IRON, RETICCTPCT in the last 72 hours. Coags: No results for input(s): INR in the last 72 hours.  Invalid input(s): PT Microbiology: No results found for this  or any previous visit (from the past 240 hour(s)).  FURTHER DISCHARGE INSTRUCTIONS:  Get Medicines reviewed and adjusted: Please take all your medications with you for your next visit with your Primary MD  Laboratory/radiological data: Please request your Primary MD to go over all hospital tests and procedure/radiological results at the follow up, please ask your Primary MD to get all Hospital records sent to his/her office.  In some cases, they will be blood work, cultures and biopsy results pending at the time of your discharge. Please request that your primary care M.D. goes through all the records of your hospital data and follows up on these results.  Also Note the following: If you experience worsening of your admission symptoms, develop shortness of breath, life threatening emergency, suicidal or homicidal thoughts you must seek medical attention immediately by calling 911 or calling your MD immediately  if symptoms less severe.  You must read complete instructions/literature along with all the possible adverse reactions/side effects for all the Medicines you take and that have been prescribed to you. Take any new Medicines after you have completely understood and accpet all the possible adverse reactions/side effects.   Do not drive when taking Pain medications or sleeping medications (Benzodaizepines)  Do not take more than prescribed Pain, Sleep and Anxiety Medications. It is not advisable to combine anxiety,sleep and pain medications without talking with your primary care practitioner  Special Instructions: If you have smoked or chewed Tobacco  in the last 2 yrs please stop smoking,  stop any regular Alcohol  and or any Recreational drug use.  Wear Seat belts while driving.  Please note: You were cared for by a hospitalist during your hospital stay. Once you are discharged, your primary care physician will handle any further medical issues. Please note that NO REFILLS for any discharge medications will be authorized once you are discharged, as it is imperative that you return to your primary care physician (or establish a relationship with a primary care physician if you do not have one) for your post hospital discharge needs so that they can reassess your need for medications and monitor your lab values.  Total Time spent coordinating discharge including counseling, education and face to face time equals  45 minutes.  SignedJeoffrey Massed 11/22/2018 11:32 AM

## 2018-11-22 NOTE — Evaluation (Signed)
Speech Language Pathology Evaluation Patient Details Name: [name] [name] MRN: [id number] DOB: [birth year] Today's Date: 11/22/2018 Time: 8469-6295 SLP Time Calculation (min) (ACUTE ONLY): 33 min  Problem List:  Patient Active Problem List   Diagnosis Date Noted  . Chest pain, atypical 11/19/2018  . Dizziness 11/19/2018  . Bilateral low back pain without sciatica 04/12/2018  . Vitamin D deficiency 04/07/2018  . Pill dysphagia 04/07/2018  . Enlarged thyroid gland 04/07/2018  . Labile blood glucose 04/07/2018  . Brittle diabetes mellitus (HCC) 04/07/2018  . AKI (acute kidney injury) (HCC) 09/22/2017  . Chronic pain syndrome 08/18/2016  . Diabetic ketoacidosis (HCC) 04/12/2016  . Hip pain, bilateral 08/23/2015  . Chronic neck pain 08/23/2015  . DKA (diabetic ketoacidoses) (HCC) 05/30/2015  . Acute renal failure (HCC) 05/30/2015  . Restless legs 08/31/2014  . Uncontrolled diabetes mellitus type 2 with peripheral artery disease (HCC) 08/10/2014  . Old myocardial infarction 08/10/2014  . CAD (coronary artery disease) 06/18/2014  . Hyperlipidemia 06/18/2014  . NSTEMI (non-ST elevated myocardial infarction) (HCC) 06/14/2014  . N&V (nausea and vomiting) 07/21/2013  . Chronic pain in left shoulder 04/06/2013  . Peripheral neuropathy 02/28/2012  . Depression 12/25/2011  . GERD (gastroesophageal reflux disease) 12/25/2011  . DM (diabetes mellitus), type 2, uncontrolled (HCC) 11/18/2011   Past Medical History:  Past Medical History:  Diagnosis Date  . Barrett's esophagus   . CAD (coronary artery disease)    a. NSTEMI 05/2014 - occluded RCA dominant proximal s/p asp-thrombectomy/DES to RCA, minimal LAD/LCx, EF 50% by cath, 65-70% by echo.  . Depression   . Diabetes type 2, uncontrolled (HCC)   . Former tobacco use   . GERD (gastroesophageal reflux disease)   . Hemorrhoids    hx of  . Hypercholesterolemia   . Hypertension   . MI (myocardial infarction) (HCC)   . Peripheral  neuropathy    Past Surgical History:  Past Surgical History:  Procedure Laterality Date  . LEFT HEART CATHETERIZATION WITH CORONARY ANGIOGRAM N/A 06/14/2014   Procedure: LEFT HEART CATHETERIZATION WITH CORONARY ANGIOGRAM;  Surgeon: Corky Crafts, MD;  Location: [facility] CATH LAB;  Service: Cardiovascular;  Laterality: N/A;  . TONSILLECTOMY     HPI:  62 yo male adm to [facility] with dizziness, back pain, + seizure.  Pt also developed right facial droop.  MRI negative.  Speech eval ordered.  Pt works full time and is getting his master's degree.     Assessment / Plan / Recommendation Clinical Impression  MOCA 7.2 administered to pt with him scoring perfect score 30/30.  Speech is intelligible but rapid and imprecise slightly - suspect this is baseline as pt denies family/friends mentioning changes in speech.  He is noted to have right facial decreased ROM with "smile" - admits to some taste changes also.  Upper motor neuron appears intact, ? source of facial decreased ROM but advised pt to monitor and follow up with MD as needed.  No SLP follow up needed.      SLP Assessment  SLP Recommendation/Assessment: Patient does not need any further Speech Lanaguage Pathology Services SLP Visit Diagnosis: Dysarthria and anarthria (R47.1)    Follow Up Recommendations  None    Frequency and Duration      n/a     SLP Evaluation Cognition  Overall Cognitive Status: Within Functional Limits for tasks assessed Arousal/Alertness: Awake/alert Orientation Level: Oriented X4 Memory: Appears intact Awareness: Appears intact Problem Solving: Appears intact Safety/Judgment: Appears intact       Comprehension  Auditory Comprehension Overall Auditory Comprehension: Appears within functional limits for tasks assessed Yes/No Questions: Not tested Commands: Within Functional Limits Conversation: Complex Visual Recognition/Discrimination Discrimination: Within Function Limits Reading Comprehension Reading  Status: Within funtional limits    Expression Expression Primary Mode of Expression: Verbal Verbal Expression Overall Verbal Expression: Appears within functional limits for tasks assessed Initiation: No impairment Repetition: No impairment Naming: No impairment Pragmatics: No impairment Written Expression Dominant Hand: Right Written Expression: Within Functional Limits   Oral / Motor  Oral Motor/Sensory Function Overall Oral Motor/Sensory Function: Mild impairment Facial ROM: Reduced right Facial Symmetry: Within Functional Limits Facial Strength: Within Functional Limits Facial Sensation: Within Functional Limits Lingual ROM: Within Functional Limits Lingual Symmetry: Within Functional Limits Lingual Strength: Within Functional Limits Lingual Sensation: Within Functional Limits Velum: Within Functional Limits Mandible: Within Functional Limits Motor Speech Overall Motor Speech: Appears within functional limits for tasks assessed Respiration: Within functional limits Resonance: Within functional limits Articulation: Within functional limitis Intelligibility: Intelligible Motor Planning: Witnin functional limits   GO                    Chales AbrahamsKimball, Zonie Crutcher Ann 11/22/2018, 12:14 PM  Donavan Burnetamara Abbe Bula, MS [facility] SLP Acute Rehab Services Pager [phone] Office [phone]

## 2018-11-22 NOTE — Progress Notes (Signed)
[facility] will sign off.   Medication Recommendations:  Per current list Other recommendations (labs, testing, etc):  Outpatient stress test. Follow up as an outpatient:  We will arrange.  Tobias Alexander, MD 11/22/2018

## 2018-11-22 NOTE — Procedures (Signed)
History: 62 yo M with syncope  Sedation: none  Technique: This is a 21 channel routine scalp EEG performed at the bedside with bipolar and monopolar montages arranged in accordance to the international 10/20 system of electrode placement. One channel was dedicated to EKG recording.    Background: The background consists of intermixed alpha and beta activities. There is a well defined posterior dominant rhythm of 9 Hz that attenuates with eye opening. Sleep is recorded with normal appearing structures.   Photic stimulation: Physiologic driving is present  EEG Abnormalities: None  Clinical Interpretation: This normal EEG is recorded in the waking and sleep state. There was no seizure or seizure predisposition recorded on this study. Please note that lack of epileptiform activity on EEG does not preclude the possibility of epilepsy.   Ritta Slot, MD [facility] [phone]  If 7pm- 7am, please page neurology on call as listed in AMION.

## 2018-11-25 ENCOUNTER — Other Ambulatory Visit: Payer: Self-pay | Admitting: Cardiology

## 2018-11-25 ENCOUNTER — Encounter: Payer: Self-pay | Admitting: Cardiology

## 2018-11-25 DIAGNOSIS — I259 Chronic ischemic heart disease, unspecified: Secondary | ICD-10-CM

## 2018-11-27 ENCOUNTER — Other Ambulatory Visit: Payer: Self-pay

## 2018-11-27 ENCOUNTER — Other Ambulatory Visit: Payer: Self-pay | Admitting: Family Medicine

## 2018-11-27 ENCOUNTER — Observation Stay (HOSPITAL_COMMUNITY)
Admission: EM | Admit: 2018-11-27 | Discharge: 2018-11-28 | Disposition: A | Discharge status: Home / Self Care | Payer: Medicare HMO | Attending: Internal Medicine | Admitting: Internal Medicine

## 2018-11-27 ENCOUNTER — Encounter (HOSPITAL_COMMUNITY): Payer: Self-pay | Admitting: Emergency Medicine

## 2018-11-27 DIAGNOSIS — E860 Dehydration: Secondary | ICD-10-CM | POA: Diagnosis present

## 2018-11-27 DIAGNOSIS — Z5900 Homelessness unspecified: Secondary | ICD-10-CM

## 2018-11-27 DIAGNOSIS — E109 Type 1 diabetes mellitus without complications: Secondary | ICD-10-CM | POA: Diagnosis present

## 2018-11-27 DIAGNOSIS — Z955 Presence of coronary angioplasty implant and graft: Secondary | ICD-10-CM | POA: Insufficient documentation

## 2018-11-27 DIAGNOSIS — E1110 Type 2 diabetes mellitus with ketoacidosis without coma: Secondary | ICD-10-CM | POA: Diagnosis not present

## 2018-11-27 DIAGNOSIS — E782 Mixed hyperlipidemia: Secondary | ICD-10-CM

## 2018-11-27 DIAGNOSIS — F32A Depression, unspecified: Secondary | ICD-10-CM | POA: Diagnosis present

## 2018-11-27 DIAGNOSIS — I129 Hypertensive chronic kidney disease with stage 1 through stage 4 chronic kidney disease, or unspecified chronic kidney disease: Secondary | ICD-10-CM | POA: Diagnosis not present

## 2018-11-27 DIAGNOSIS — K219 Gastro-esophageal reflux disease without esophagitis: Secondary | ICD-10-CM | POA: Diagnosis not present

## 2018-11-27 DIAGNOSIS — E1151 Type 2 diabetes mellitus with diabetic peripheral angiopathy without gangrene: Secondary | ICD-10-CM | POA: Diagnosis not present

## 2018-11-27 DIAGNOSIS — E785 Hyperlipidemia, unspecified: Secondary | ICD-10-CM | POA: Diagnosis present

## 2018-11-27 DIAGNOSIS — I252 Old myocardial infarction: Secondary | ICD-10-CM | POA: Diagnosis not present

## 2018-11-27 DIAGNOSIS — I2510 Atherosclerotic heart disease of native coronary artery without angina pectoris: Secondary | ICD-10-CM | POA: Diagnosis present

## 2018-11-27 DIAGNOSIS — Z6834 Body mass index (BMI) 34.0-34.9, adult: Secondary | ICD-10-CM | POA: Insufficient documentation

## 2018-11-27 DIAGNOSIS — E1122 Type 2 diabetes mellitus with diabetic chronic kidney disease: Secondary | ICD-10-CM | POA: Diagnosis not present

## 2018-11-27 DIAGNOSIS — Z7982 Long term (current) use of aspirin: Secondary | ICD-10-CM | POA: Insufficient documentation

## 2018-11-27 DIAGNOSIS — R197 Diarrhea, unspecified: Secondary | ICD-10-CM

## 2018-11-27 DIAGNOSIS — F329 Major depressive disorder, single episode, unspecified: Secondary | ICD-10-CM | POA: Insufficient documentation

## 2018-11-27 DIAGNOSIS — Z9114 Patient's other noncompliance with medication regimen: Secondary | ICD-10-CM

## 2018-11-27 DIAGNOSIS — E669 Obesity, unspecified: Secondary | ICD-10-CM | POA: Insufficient documentation

## 2018-11-27 DIAGNOSIS — R112 Nausea with vomiting, unspecified: Secondary | ICD-10-CM

## 2018-11-27 DIAGNOSIS — G894 Chronic pain syndrome: Secondary | ICD-10-CM | POA: Diagnosis not present

## 2018-11-27 DIAGNOSIS — Z794 Long term (current) use of insulin: Secondary | ICD-10-CM | POA: Insufficient documentation

## 2018-11-27 DIAGNOSIS — N182 Chronic kidney disease, stage 2 (mild): Secondary | ICD-10-CM | POA: Diagnosis not present

## 2018-11-27 DIAGNOSIS — R739 Hyperglycemia, unspecified: Secondary | ICD-10-CM

## 2018-11-27 DIAGNOSIS — E7800 Pure hypercholesterolemia, unspecified: Secondary | ICD-10-CM | POA: Diagnosis not present

## 2018-11-27 DIAGNOSIS — E1140 Type 2 diabetes mellitus with diabetic neuropathy, unspecified: Secondary | ICD-10-CM | POA: Diagnosis not present

## 2018-11-27 DIAGNOSIS — Z87891 Personal history of nicotine dependence: Secondary | ICD-10-CM | POA: Insufficient documentation

## 2018-11-27 DIAGNOSIS — Z791 Long term (current) use of non-steroidal anti-inflammatories (NSAID): Secondary | ICD-10-CM | POA: Diagnosis not present

## 2018-11-27 DIAGNOSIS — N179 Acute kidney failure, unspecified: Secondary | ICD-10-CM | POA: Diagnosis present

## 2018-11-27 DIAGNOSIS — E1165 Type 2 diabetes mellitus with hyperglycemia: Secondary | ICD-10-CM | POA: Diagnosis not present

## 2018-11-27 DIAGNOSIS — R000 Tachycardia, unspecified: Secondary | ICD-10-CM | POA: Diagnosis not present

## 2018-11-27 DIAGNOSIS — Z91148 Patient's other noncompliance with medication regimen for other reason: Secondary | ICD-10-CM

## 2018-11-27 LAB — BLOOD GAS, VENOUS
Acid-base deficit: 8 mmol/L — ABNORMAL HIGH (ref 0.0–2.0)
Bicarbonate: 16.7 mmol/L — ABNORMAL LOW (ref 20.0–28.0)
FIO2: 21
O2 Saturation: 81.3 %
PATIENT TEMPERATURE: 98.6
pCO2, Ven: 32.9 mmHg — ABNORMAL LOW (ref 44.0–60.0)
pH, Ven: 7.325 (ref 7.250–7.430)
pO2, Ven: 48.7 mmHg — ABNORMAL HIGH (ref 32.0–45.0)

## 2018-11-27 LAB — BASIC METABOLIC PANEL
Anion gap: 18 — ABNORMAL HIGH (ref 5–15)
Anion gap: 10 (ref 5–15)
Anion gap: 6 (ref 5–15)
BUN: 20 mg/dL (ref 6–20)
BUN: 18 mg/dL (ref 6–20)
BUN: 14 mg/dL (ref 6–20)
CALCIUM: 8.2 mg/dL — AB (ref 8.9–10.3)
CO2: 12 mmol/L — ABNORMAL LOW (ref 22–32)
CO2: 16 mmol/L — ABNORMAL LOW (ref 22–32)
CO2: 20 mmol/L — ABNORMAL LOW (ref 22–32)
Calcium: 8.4 mg/dL — ABNORMAL LOW (ref 8.9–10.3)
Calcium: 8.1 mg/dL — ABNORMAL LOW (ref 8.9–10.3)
Chloride: 103 mmol/L (ref 98–111)
Chloride: 111 mmol/L (ref 98–111)
Chloride: 112 mmol/L — ABNORMAL HIGH (ref 98–111)
Creatinine, Ser: 1.39 mg/dL — ABNORMAL HIGH (ref 0.61–1.24)
Creatinine, Ser: 1.35 mg/dL — ABNORMAL HIGH (ref 0.61–1.24)
Creatinine, Ser: 1.23 mg/dL (ref 0.61–1.24)
GFR calc Af Amer: >60 mL/min (ref >60)
GFR calc Af Amer: >60 mL/min (ref >60)
GFR calc non Af Amer: 55 mL/min — ABNORMAL LOW (ref >60)
GFR, EST NON AFRICAN AMERICAN: 57 mL/min — AB (ref >60)
Glucose, Bld: 606 mg/dL (ref 70–99)
Glucose, Bld: 312 mg/dL — ABNORMAL HIGH (ref 70–99)
Glucose, Bld: 129 mg/dL — ABNORMAL HIGH (ref 70–99)
POTASSIUM: 5 mmol/L (ref 3.5–5.1)
Potassium: 4.1 mmol/L (ref 3.5–5.1)
Potassium: 4.3 mmol/L (ref 3.5–5.1)
Sodium: 133 mmol/L — ABNORMAL LOW (ref 135–145)
Sodium: 137 mmol/L (ref 135–145)
Sodium: 138 mmol/L (ref 135–145)

## 2018-11-27 LAB — URINALYSIS, ROUTINE W REFLEX MICROSCOPIC
Bacteria, UA: NONE SEEN
Bilirubin Urine: NEGATIVE
Glucose, UA: >=500 mg/dL — AB
Hgb urine dipstick: NEGATIVE
Ketones, ur: 20 mg/dL — AB
Leukocytes, UA: NEGATIVE
NITRITE: NEGATIVE
PH: 5 (ref 5.0–8.0)
Protein, ur: NEGATIVE mg/dL
Specific Gravity, Urine: 1.02 (ref 1.005–1.030)

## 2018-11-27 LAB — COMPREHENSIVE METABOLIC PANEL
ALT: 21 U/L (ref 0–44)
AST: 26 U/L (ref 15–41)
Albumin: 3.9 g/dL (ref 3.5–5.0)
Alkaline Phosphatase: 133 U/L — ABNORMAL HIGH (ref 38–126)
Anion gap: 16 — ABNORMAL HIGH (ref 5–15)
BUN: 17 mg/dL (ref 6–20)
CO2: 17 mmol/L — ABNORMAL LOW (ref 22–32)
Calcium: 8.7 mg/dL — ABNORMAL LOW (ref 8.9–10.3)
Chloride: 99 mmol/L (ref 98–111)
Creatinine, Ser: 1.23 mg/dL (ref 0.61–1.24)
GFR calc Af Amer: >60 mL/min (ref >60)
GFR calc non Af Amer: >60 mL/min (ref >60)
Glucose, Bld: 668 mg/dL (ref 70–99)
Potassium: 4.9 mmol/L (ref 3.5–5.1)
Sodium: 132 mmol/L — ABNORMAL LOW (ref 135–145)
TOTAL PROTEIN: 6.9 g/dL (ref 6.5–8.1)
Total Bilirubin: 0.9 mg/dL (ref 0.3–1.2)

## 2018-11-27 LAB — CBC
HCT: 39.9 % (ref 39.0–52.0)
Hemoglobin: 12.8 g/dL — ABNORMAL LOW (ref 13.0–17.0)
MCH: 28.3 pg (ref 26.0–34.0)
MCHC: 32.1 g/dL (ref 30.0–36.0)
MCV: 88.1 fL (ref 80.0–100.0)
Platelets: 342 10*3/uL (ref 150–400)
RBC: 4.53 MIL/uL (ref 4.22–5.81)
RDW: 13.2 % (ref 11.5–15.5)
WBC: 9.4 10*3/uL (ref 4.0–10.5)
nRBC: 0 % (ref 0.0–0.2)

## 2018-11-27 LAB — CBG MONITORING, ED
Glucose-Capillary: >600 mg/dL (ref 70–99)
Glucose-Capillary: 551 mg/dL (ref 70–99)

## 2018-11-27 LAB — GLUCOSE, CAPILLARY
GLUCOSE-CAPILLARY: 456 mg/dL — AB (ref 70–99)
GLUCOSE-CAPILLARY: 373 mg/dL — AB (ref 70–99)
Glucose-Capillary: 562 mg/dL (ref 70–99)
Glucose-Capillary: 422 mg/dL — ABNORMAL HIGH (ref 70–99)
Glucose-Capillary: 246 mg/dL — ABNORMAL HIGH (ref 70–99)
Glucose-Capillary: 204 mg/dL — ABNORMAL HIGH (ref 70–99)
Glucose-Capillary: 146 mg/dL — ABNORMAL HIGH (ref 70–99)
Glucose-Capillary: 108 mg/dL — ABNORMAL HIGH (ref 70–99)
Glucose-Capillary: 99 mg/dL (ref 70–99)

## 2018-11-27 LAB — LIPASE, BLOOD: Lipase: 31 U/L (ref 11–51)

## 2018-11-27 MED ORDER — LACTATED RINGERS IV BOLUS: 1000.0000 mL | Freq: Once | INTRAVENOUS | Status: DC

## 2018-11-27 MED ORDER — SODIUM CHLORIDE 0.9 % IV SOLN
INTRAVENOUS | Status: AC
  Administered 2018-11-27: 15:00:00 via INTRAVENOUS

## 2018-11-27 MED ORDER — NITROGLYCERIN 0.4 MG SL SUBL: 0.4000 mg | SUBLINGUAL_TABLET | SUBLINGUAL | Status: DC | PRN

## 2018-11-27 MED ORDER — PANTOPRAZOLE SODIUM 40 MG PO TBEC
40.0000 mg | DELAYED_RELEASE_TABLET | Freq: Every day | ORAL | Status: DC
  Administered 2018-11-27 – 2018-11-28 (×2): 40 mg via ORAL
  Filled 2018-11-27 (×2): qty 1

## 2018-11-27 MED ORDER — GABAPENTIN 100 MG PO CAPS
100.0000 mg | ORAL_CAPSULE | Freq: Two times a day (BID) | ORAL | Status: DC
  Administered 2018-11-27 – 2018-11-28 (×2): 100 mg via ORAL
  Filled 2018-11-27 (×2): qty 1

## 2018-11-27 MED ORDER — HYDRALAZINE HCL 20 MG/ML IJ SOLN: 10.0000 mg | INTRAMUSCULAR | Status: DC | PRN

## 2018-11-27 MED ORDER — POTASSIUM CHLORIDE 10 MEQ/100ML IV SOLN
10.0000 meq | INTRAVENOUS | Status: DC
  Administered 2018-11-27: 10 meq via INTRAVENOUS
  Filled 2018-11-27: qty 100

## 2018-11-27 MED ORDER — SODIUM CHLORIDE 0.9 % IV SOLN
INTRAVENOUS | Status: DC
  Administered 2018-11-27: 15:00:00 via INTRAVENOUS

## 2018-11-27 MED ORDER — ONDANSETRON HCL 4 MG/2ML IJ SOLN
4.0000 mg | Freq: Once | INTRAMUSCULAR | Status: AC
  Administered 2018-11-27: 4 mg via INTRAVENOUS
  Filled 2018-11-27: qty 2

## 2018-11-27 MED ORDER — ENOXAPARIN SODIUM 40 MG/0.4ML ~~LOC~~ SOLN
40.0000 mg | SUBCUTANEOUS | Status: DC
  Administered 2018-11-27: 40 mg via SUBCUTANEOUS
  Filled 2018-11-27: qty 0.4

## 2018-11-27 MED ORDER — SODIUM CHLORIDE 0.9 % IV BOLUS
1000.0000 mL | Freq: Once | INTRAVENOUS | Status: AC
  Administered 2018-11-27: 1000 mL via INTRAVENOUS

## 2018-11-27 MED ORDER — DEXTROSE-NACL 5-0.45 % IV SOLN
INTRAVENOUS | Status: DC
  Administered 2018-11-27: 19:00:00 via INTRAVENOUS

## 2018-11-27 MED ORDER — LEVETIRACETAM 500 MG PO TABS
500.0000 mg | ORAL_TABLET | Freq: Two times a day (BID) | ORAL | Status: DC
  Administered 2018-11-27 – 2018-11-28 (×2): 500 mg via ORAL
  Filled 2018-11-27 (×2): qty 1

## 2018-11-27 MED ORDER — INSULIN REGULAR(HUMAN) IN NACL 100-0.9 UT/100ML-% IV SOLN: INTRAVENOUS | Status: DC

## 2018-11-27 MED ORDER — ROSUVASTATIN CALCIUM 20 MG PO TABS
40.0000 mg | ORAL_TABLET | Freq: Every day | ORAL | Status: DC
  Administered 2018-11-27 – 2018-11-28 (×2): 40 mg via ORAL
  Filled 2018-11-27: qty 2
  Filled 2018-11-27: qty 1
  Filled 2018-11-27: qty 2
  Filled 2018-11-27: qty 1

## 2018-11-27 MED ORDER — INSULIN REGULAR(HUMAN) IN NACL 100-0.9 UT/100ML-% IV SOLN
INTRAVENOUS | Status: DC
  Administered 2018-11-27: 5 [IU]/h via INTRAVENOUS
  Filled 2018-11-27: qty 100

## 2018-11-27 MED ORDER — INSULIN GLARGINE 100 UNIT/ML ~~LOC~~ SOLN
15.0000 [IU] | Freq: Once | SUBCUTANEOUS | Status: AC
  Administered 2018-11-27: 15 [IU] via SUBCUTANEOUS
  Filled 2018-11-27: qty 0.15

## 2018-11-27 MED ORDER — INSULIN ASPART 100 UNIT/ML ~~LOC~~ SOLN
0.0000 [IU] | SUBCUTANEOUS | Status: DC
  Administered 2018-11-28: 3 [IU] via SUBCUTANEOUS
  Administered 2018-11-28: 8 [IU] via SUBCUTANEOUS
  Administered 2018-11-28: 5 [IU] via SUBCUTANEOUS

## 2018-11-27 MED ORDER — ONDANSETRON HCL 4 MG/2ML IJ SOLN: 4.0000 mg | Freq: Three times a day (TID) | INTRAMUSCULAR | Status: DC | PRN

## 2018-11-27 MED ORDER — ORAL CARE MOUTH RINSE
15.0000 mL | Freq: Two times a day (BID) | OROMUCOSAL | Status: DC
  Administered 2018-11-27: 15 mL via OROMUCOSAL

## 2018-11-27 MED ORDER — METOPROLOL TARTRATE 25 MG PO TABS
12.5000 mg | ORAL_TABLET | Freq: Two times a day (BID) | ORAL | Status: DC
  Administered 2018-11-27 – 2018-11-28 (×2): 12.5 mg via ORAL
  Filled 2018-11-27 (×2): qty 1

## 2018-11-27 MED ORDER — HYDROCODONE-ACETAMINOPHEN 7.5-325 MG PO TABS
1.0000 | ORAL_TABLET | Freq: Four times a day (QID) | ORAL | Status: DC | PRN
  Administered 2018-11-27 – 2018-11-28 (×4): 1 via ORAL
  Filled 2018-11-27 (×4): qty 1

## 2018-11-27 MED ORDER — SODIUM CHLORIDE 0.9 % IV SOLN
INTRAVENOUS | Status: DC
  Administered 2018-11-27 – 2018-11-28 (×2): via INTRAVENOUS

## 2018-11-27 MED ORDER — BUTALBITAL-APAP-CAFFEINE 50-325-40 MG PO TABS: 2.0000 | ORAL_TABLET | Freq: Once | ORAL | Status: DC

## 2018-11-27 MED ORDER — ASPIRIN EC 81 MG PO TBEC
81.0000 mg | DELAYED_RELEASE_TABLET | Freq: Every day | ORAL | Status: DC
  Administered 2018-11-27 – 2018-11-28 (×2): 81 mg via ORAL
  Filled 2018-11-27 (×2): qty 1

## 2018-11-27 NOTE — ED Notes (Signed)
ED TO INPATIENT HANDOFF REPORT  Name/Age/Gender [name] DamesKevin J [name] 62 y.o. male  Code Status    Code Status Orders  (From admission, onward)         Start     Ordered   11/27/18 1410  Full code  Continuous     11/27/18 1410        Code Status History    Date Active Date Inactive Code Status Order ID Comments User Context   11/19/2018 1714 11/22/2018 1908 Full Code [id number]  Merlene LaughterSheikh, Omair Latif, DO Inpatient   09/29/2017 1855 10/01/2017 1728 Full Code [id number]  Leatha GildingGherghe, Costin M, MD Inpatient   09/22/2017 1723 09/24/2017 1602 Full Code [id number]  Haydee SalterHobbs, Phillip M, MD ED   04/12/2016 1424 04/14/2016 1728 Full Code [id number]  Jeralyn BennettZamora, Ezequiel, MD Inpatient   05/30/2015 1754 05/31/2015 2049 Full Code [id number]  Calvert Cantorizwan, Saima, MD Inpatient   06/14/2014 1638 06/18/2014 1414 Full Code [id number]  Corky CraftsVaranasi, Jayadeep S, MD Inpatient   06/14/2014 1127 06/14/2014 1638 Full Code [id number]  Dwana MelenaHager, Bryan W, PA-C Inpatient   07/22/2013 0019 07/24/2013 1738 Full Code [id number]  Eduard ClosKakrakandy, Arshad N, MD Inpatient   10/01/2012 0905 10/04/2012 1509 Full Code [id number]  Joylene DraftShaffer, Shae Lee N, RN Inpatient   10/01/2012 0530 10/01/2012 0905 Full Code [id number]  Hilario Quarryay, Danielle S, MD ED   04/08/2012 1640 04/09/2012 1604 Full Code [id number]  Raenette Roverhigpen, Jacqueline Renee, RN Inpatient   02/28/2012 2356 03/01/2012 1745 Full Code [id number]  Forbes CellarMbemena, Queeneth Chibuzo, RN Inpatient   12/24/2011 1907 12/26/2011 1453 Full Code [id number]  Rhetta MuraSamtani, Jai-Gurmukh, MD ED   11/16/2011 1519 11/16/2011 2241 Full Code [id number]  Dione BoozeGlick, David, MD ED      Home/SNF/Other Home  Chief Complaint nausea,dizzy;emesis  Level of Care/Admitting Diagnosis ED Disposition    ED Disposition Condition Comment   Admit  [facility] [[id number]]  Level of Care: Stepdown [14]  Admit to SDU based on following criteria: Severe physiological/psychological symptoms:  Any diagnosis requiring assessment & intervention at least every 4 hours  on an ongoing basis to obtain desired patient outcomes including stability and rehabilitation  Diagnosis: DKA (diabetic ketoacidosis) [facility](HCC) [[id number]][[id number]]  Admitting Physician: Tyrone NineGRUNZ, RYAN B [phone]]  Attending Physician: Tyrone NineGRUNZ, RYAN B [phone]]  PT Class (Do Not Modify): Observation [104]  PT Acc Code (Do Not Modify): Observation [10022]       Medical History Past Medical History:  Diagnosis Date  . Barrett's esophagus   . CAD (coronary artery disease)    a. NSTEMI 05/2014 - occluded RCA dominant proximal s/p asp-thrombectomy/DES to RCA, minimal LAD/LCx, EF 50% by cath, 65-70% by echo.  . Depression   . Diabetes type 2, uncontrolled (HCC)   . Former tobacco use   . GERD (gastroesophageal reflux disease)   . Hemorrhoids    hx of  . Hypercholesterolemia   . Hypertension   . MI (myocardial infarction) (HCC)   . Peripheral neuropathy     Allergies No Known Allergies  IV Location/Drains/Wounds Patient Lines/Drains/Airways Status   Active Line/Drains/Airways    Name:   Placement date:   Placement time:   Site:   Days:   Peripheral IV 11/27/18 Right Forearm   11/27/18    1039    Forearm   less than 1   Peripheral IV 11/27/18 Right;Upper Forearm   11/27/18    1337    Forearm   less than 1          Labs/Imaging Results for orders placed  or performed during the hospital encounter of 11/27/18 (from the past 48 hour(s))  Urinalysis, Routine w reflex microscopic     Status: Abnormal   Collection Time: 11/27/18  9:41 AM  Result Value Ref Range   Color, Urine COLORLESS (A) YELLOW   APPearance CLEAR CLEAR   Specific Gravity, Urine 1.020 1.005 - 1.030   pH 5.0 5.0 - 8.0   Glucose, UA >=500 (A) NEGATIVE mg/dL   Hgb urine dipstick NEGATIVE NEGATIVE   Bilirubin Urine NEGATIVE NEGATIVE   Ketones, ur 20 (A) NEGATIVE mg/dL   Protein, ur NEGATIVE NEGATIVE mg/dL   Nitrite NEGATIVE NEGATIVE   Leukocytes, UA NEGATIVE NEGATIVE   WBC, UA 0-5 0 - 5 WBC/hpf   Bacteria, UA NONE SEEN NONE SEEN    Squamous Epithelial / LPF 0-5 0 - 5    Comment: Performed at [facility], [address], [place], [place] [address]  POC CBG, ED     Status: Abnormal   Collection Time: 11/27/18 10:34 AM  Result Value Ref Range   Glucose-Capillary >600 (HH) 70 - 99 mg/dL  Lipase, blood     Status: None   Collection Time: 11/27/18 10:39 AM  Result Value Ref Range   Lipase 31 11 - 51 U/L    Comment: Performed at [facility], [address] [place], [place] [address]  Comprehensive metabolic panel     Status: Abnormal   Collection Time: 11/27/18 10:39 AM  Result Value Ref Range   Sodium 132 (L) 135 - 145 mmol/L   Potassium 4.9 3.5 - 5.1 mmol/L   Chloride 99 98 - 111 mmol/L   CO2 17 (L) 22 - 32 mmol/L   Glucose, Bld 668 (HH) 70 - 99 mg/dL    Comment: CRITICAL RESULT CALLED TO, READ BACK BY AND VERIFIED WITH: R.Maebry Obrien,RN [id number] @1206  BY V.WILKINS    BUN 17 6 - 20 mg/dL   Creatinine, Ser 8.29 0.61 - 1.24 mg/dL   Calcium 8.7 (L) 8.9 - 10.3 mg/dL   Total Protein 6.9 6.5 - 8.1 g/dL   Albumin 3.9 3.5 - 5.0 g/dL   AST 26 15 - 41 U/L   ALT 21 0 - 44 U/L   Alkaline Phosphatase 133 (H) 38 - 126 U/L   Total Bilirubin 0.9 0.3 - 1.2 mg/dL   GFR calc non Af Amer >60 >60 mL/min   GFR calc Af Amer >60 >60 mL/min   Anion gap 16 (H) 5 - 15    Comment: Performed at [facility], [address] [name], [place] [address]  CBC     Status: Abnormal   Collection Time: 11/27/18 10:39 AM  Result Value Ref Range   WBC 9.4 4.0 - 10.5 K/uL   RBC 4.53 4.22 - 5.81 MIL/uL   Hemoglobin 12.8 (L) 13.0 - 17.0 g/dL   HCT 08.6 57.8 - 46.9 %   MCV 88.1 80.0 - 100.0 fL   MCH 28.3 26.0 - 34.0 pg   MCHC 32.1 30.0 - 36.0 g/dL   RDW 62.9 52.8 - 41.3 %   Platelets 342 150 - 400 K/uL   nRBC 0.0 0.0 - 0.2 %    Comment: Performed at [facility], [address], [place], [place] [address]  Blood gas, venous     Status: Abnormal   Collection Time:  11/27/18 11:11 AM  Result Value Ref Range   FIO2 21.00    Delivery systems ROOM AIR    pH,  Ven 7.325 7.250 - 7.430   pCO2, Ven 32.9 (L) 44.0 - 60.0 mmHg   pO2, Ven 48.7 (H) 32.0 - 45.0 mmHg   Bicarbonate 16.7 (L) 20.0 - 28.0 mmol/L   Acid-base deficit 8.0 (H) 0.0 - 2.0 mmol/L   O2 Saturation 81.3 %   Patient temperature 98.6    Collection site VEIN    Drawn by RN    Sample type VEIN     Comment: Performed at [facility], [address], [place], [place] [address]  POC CBG, ED     Status: Abnormal   Collection Time: 11/27/18  1:03 PM  Result Value Ref Range   Glucose-Capillary 551 (HH) 70 - 99 mg/dL   Comment 1 Notify RN    No results found. EKG Interpretation  Date/Time:  Sunday November 27 2018 10:26:12 EST Ventricular Rate:  100 PR Interval:    QRS Duration: 79 QT Interval:  335 QTC Calculation: 432 R Axis:   18 Text Interpretation:  Sinus tachycardia unchanged from prior Confirmed by Adam, Curatolo ([id number]) on 11/27/2018 11:11:22 AM   Pending Labs Unresulted Labs (From admission, onward)    Start     Ordered   12/04/18 0500  Creatinine, serum  (enoxaparin (LOVENOX)    CrCl >/= 30 ml/min)  Weekly,   R    Comments:  while on enoxaparin therapy    11/27/18 1410   11/27/18 1410  Basic metabolic panel  Now then every 6 hours,   STAT     12 /29/19 1410          Vitals/Pain Today's Vitals   11/27/18 1335 11/27/18 1400 11/27/18 1412 11/27/18 1416  BP: (!) 144/66 (!) 149/72 (!) 149/72   Pulse: (!) 109 (!) 106 (!) 112   Resp: 20 (!) 23 20   Temp:      TempSrc:      SpO2: 99% 99% 98%   PainSc:    7     Isolation Precautions No active isolations  Medications Medications  ondansetron (ZOFRAN) injection 4 mg (has no administration in time range)  aspirin EC tablet 81 mg (has no administration in time range)  HYDROcodone-acetaminophen (NORCO) 7.5-325 MG per tablet 1 tablet (1 tablet Oral Given 11/27/18 1345)  metoprolol tartrate (LOPRESSOR)  tablet 12.5 mg (has no administration in time range)  nitroGLYCERIN (NITROSTAT) SL tablet 0.4 mg (has no administration in time range)  rosuvastatin (CRESTOR) tablet 40 mg (has no administration in time range)  pantoprazole (PROTONIX) EC tablet 40 mg (has no administration in time range)  gabapentin (NEURONTIN) capsule 100 mg (has no administration in time range)  levETIRAcetam (KEPPRA) tablet 500 mg (has no administration in time range)  0.9 %  sodium chloride infusion (has no administration in time range)  0.9 %  sodium chloride infusion (has no administration in time range)  dextrose 5 %-0.45 % sodium chloride infusion (has no administration in time range)  insulin regular, human (MYXREDLIN) 100 units/ 100 mL infusion (has no administration in time range)  enoxaparin (LOVENOX) injection 40 mg (has no administration in time range)  ondansetron (ZOFRAN) injection 4 mg (4 mg Intravenous Given 11/27/18 1046)  sodium chloride 0.9 % bolus 1,000 mL (0 mLs Intravenous Stopped 11/27/18 1240)  sodium chloride 0.9 % bolus 1,000 mL (0 mLs Intravenous Stopped 11/27/18 1304)  ondansetron (ZOFRAN) injection 4 mg (4 mg Intravenous Given 11/27/18 1314)    Mobility walks

## 2018-11-27 NOTE — Progress Notes (Signed)
CRITICAL VALUE ALERT  Critical Value:  606  Date & Time Notied:  NONE   Provider Notified:Dr. Jarvis Newcomer recently rounded on patient aware of admitting glucose.  glucose is lower than previous critical value of 668. patient on insulin drip (glucose trending down  CBG  456 @ 1601)  Orders Received/Actions taken: patient on insulin drip

## 2018-11-27 NOTE — H&P (Signed)
History and Physical   GILLIAM SULLIVANT NLG:[id number] DOB: 1956-03-19 DOA: 11/27/2018  Referring MD/NP/PA: Dr. Lockie Mola, EDP PCP: Sherren Mocha, MD  Patient coming from: Home  Chief Complaint: Emesis, abdominal discomfort  HPI: COSTA MACKIEWICZ is a 62 y.o. male with a history of CAD s/p DES to RCA, uncontrolled IDT2DM with neuropathy, GERD with Barrett's esophagus, HTN, HLD and depression who presented to the ED 12/29 with 2 days of abdominal discomfort. This is vaguely described as a gradual onset of an abnormal churning sensation with nausea which he first noticed 2 days ago when he had a loose stool. He's had a normal stool since, but also developed emesis of stomach contents yesterday. This was associated with poor po intake so he's not been taking insulin as usual. He's been checking blood sugars which have ranged from the 60's to the 500's at home with hypoglycemic symptoms below 70mg /dl and has had waxing/waning blurry vision, polyuria and polydipsia.  ED Course: On arrival he was significantly hyperglycemic with elevated anion gap, low bicarbonate, and ketonuria consistent with DKA. He was given 2L NS with minimal improvement in glucose but some improvement in nausea. He still has no appetite.  Review of Systems: +Pounding headache which comes and goes. Denies fever, chills, weight loss, changes in hearing, localized numbness or weakness that is new, cough, sore throat, chest pain, palpitations, shortness of breath, blood in stool, myalgias, arthralgias, rash, and per HPI. All others reviewed and are negative.   Past Medical History:  Diagnosis Date  . Barrett's esophagus   . CAD (coronary artery disease)    a. NSTEMI 05/2014 - occluded RCA dominant proximal s/p asp-thrombectomy/DES to RCA, minimal LAD/LCx, EF 50% by cath, 65-70% by echo.  . Depression   . Diabetes type 2, uncontrolled (HCC)   . Former tobacco use   . GERD (gastroesophageal reflux disease)   . Hemorrhoids    hx of  .  Hypercholesterolemia   . Hypertension   . MI (myocardial infarction) (HCC)   . Peripheral neuropathy    Past Surgical History:  Procedure Laterality Date  . LEFT HEART CATHETERIZATION WITH CORONARY ANGIOGRAM N/A 06/14/2014   Procedure: LEFT HEART CATHETERIZATION WITH CORONARY ANGIOGRAM;  Surgeon: Corky Crafts, MD;  Location: [facility] CATH LAB;  Service: Cardiovascular;  Laterality: N/A;  . TONSILLECTOMY     - Remote smoker, no EtOH or other drugs. Married.  reports that he quit smoking about 20 years ago. He has a 0.75 pack-year smoking history. He has never used smokeless tobacco. He reports that he does not drink alcohol or use drugs. No Known Allergies Family History  Problem Relation Age of Onset  . Hypertension Mother   . Alzheimer's disease Mother   . Alcohol abuse Father   . Cancer Father 93       "Throat"  . Diabetes type II Brother   . Cancer Brother 68       throat cancer  . Heart disease Neg Hx    - Family history otherwise reviewed and not pertinent.  Prior to Admission medications   Medication Sig Start Date End Date Taking? Authorizing Provider  EQ ASPIRIN ADULT LOW DOSE 81 MG EC tablet TAKE ONE TABLET BY MOUTH ONCE DAILY 07/02/15  Yes Corky Crafts, MD  gabapentin (NEURONTIN) 100 MG capsule TAKE 2 CAPSULES BY MOUTH EVERY MORNING AND AT BEDTIME Patient taking differently: Take 100 mg by mouth 2 (two) times daily.  09/09/18  Yes Jones Bales, NP  HYDROcodone-acetaminophen (NORCO) 7.5-325 MG tablet Take 1 tablet by mouth every 6 (six) hours as needed for moderate pain. 10/03/18  Yes Jones Bales, NP  insulin aspart (NOVOLOG FLEXPEN) 100 UNIT/ML FlexPen INJECT 30 TO 35 UNITS SUBCUTANEOUSLY THREE TIMES DAILY Patient taking differently: Inject 15-30 Units into the skin 3 (three) times daily with meals. Per sliding scale 03/11/18  Yes Sherren Mocha, MD  LEVEMIR FLEXTOUCH 100 UNIT/ML Pen INJECT 45 UNITS UNDER THE SKIN EVERY MORNING AND 40 UNITS EVERY NIGHT AT  BEDTIME Patient taking differently: Inject 40-45 Units into the skin See admin instructions. INJECT 45 UNITS UNDER THE SKIN EVERY MORNING AND 40 UNITS EVERY NIGHT AT BEDTIME 09/28/18  Yes Sherren Mocha, MD  lisinopril (PRINIVIL,ZESTRIL) 2.5 MG tablet TAKE 1 TABLET(2.5 MG) BY MOUTH DAILY Patient taking differently: Take 2.5 mg by mouth daily.  09/09/18  Yes Sherren Mocha, MD  metoprolol tartrate (LOPRESSOR) 25 MG tablet TAKE 1/2 TABLET(12.5 MG) BY MOUTH TWICE DAILY Patient taking differently: Take 12.5 mg by mouth 2 (two) times daily.  09/12/18  Yes Sherren Mocha, MD  nitroGLYCERIN (NITROSTAT) 0.4 MG SL tablet Place 1 tablet (0.4 mg total) under the tongue every 5 (five) minutes as needed for chest pain (up to 3 doses). 11/10/16  Yes Corky Crafts, MD  pantoprazole (PROTONIX) 40 MG tablet TAKE 1 TABLET BY MOUTH EVERY DAY 09/26/18  Yes Sherren Mocha, MD  rosuvastatin (CRESTOR) 40 MG tablet TAKE 1 TABLET(40 MG) BY MOUTH DAILY Patient taking differently: Take 40 mg by mouth daily.  09/09/18  Yes Sherren Mocha, MD  furosemide (LASIX) 20 MG tablet Take 1 tablet (20 mg total) by mouth daily. 11/12/18   Eber Hong, MD  ibuprofen (ADVIL,MOTRIN) 400 MG tablet Take 1 tablet (400 mg total) by mouth every 6 (six) hours as needed. Patient not taking: Reported on 11/19/2018 09/06/18   Lorre Nick, MD  levETIRAcetam (KEPPRA) 500 MG tablet Take 1 tablet (500 mg total) by mouth 2 (two) times daily. 11/22/18   Maretta Bees, MD    Physical Exam: Vitals:   11/27/18 1400 11/27/18 1412 11/27/18 1514 11/27/18 1600  BP: (!) 149/72 (!) 149/72  (!) 153/56  Pulse: (!) 106 (!) 112  (!) 110  Resp: (!) 23 20  20   Temp:   98.9 F (37.2 C) 99.1 F (37.3 C)  TempSrc:   Oral Oral  SpO2: 99% 98%  97%  Weight:   106.3 kg   Height:   5\' 9"  (1.753 m)    Constitutional: 62 y.o. male in no distress, calm demeanor Eyes: Lids and conjunctivae normal, PERRL ENMT: Mucous membranes are dry. Posterior pharynx clear of any  exudate or lesions. Fair dentition.  Neck: normal, supple, no masses, no thyromegaly Respiratory: Non-labored but tachypneic without accessory muscle use. Clear breath sounds to auscultation bilaterally Cardiovascular: Regular tachycardia, no murmurs, rubs, or gallops. No carotid bruits. No JVD. Trace LE edema. + pedal pulses. Abdomen: Normoactive bowel sounds. No tenderness, non-distended, and no masses palpated. No hepatosplenomegaly. GU: No indwelling catheter Musculoskeletal: No clubbing / cyanosis. No joint deformity upper and lower extremities. Good ROM, no contractures. Normal muscle tone.  Skin: Warm, dry. No rashes, wounds, no ulcers. No significant lesions noted.  Neurologic: CN II-XII grossly intact. Speech normal. No focal deficits in motor strength or sensation in all extremities.  Psychiatric: Alert and oriented x3. Normal judgment and insight. Mood fair with restrictive affect.   Labs on Admission: I have personally reviewed  following labs and imaging studies  CBC: Recent Labs  Lab 11/27/18 1039  WBC 9.4  HGB 12.8*  HCT 39.9  MCV 88.1  PLT 342   Basic Metabolic Panel: Recent Labs  Lab 11/27/18 1039 11/27/18 1458  NA 132* 133*  K 4.9 5.0  CL 99 103  CO2 17* 12*  GLUCOSE 668* 606*  BUN 17 20  CREATININE 1.23 1.39*  CALCIUM 8.7* 8.4*   GFR: Estimated Creatinine Clearance: 67.9 mL/min (A) (by C-G formula based on SCr of 1.39 mg/dL (H)).   Liver Function Tests: Recent Labs  Lab 11/27/18 1039  AST 26  ALT 21  ALKPHOS 133*  BILITOT 0.9  PROT 6.9  ALBUMIN 3.9   Recent Labs  Lab 11/27/18 1039  LIPASE 31   CBG: Recent Labs  Lab 11/27/18 1034 11/27/18 1303 11/27/18 1451 11/27/18 1601 11/27/18 1659  GLUCAP >600* 551* 562* 456* 422*   Urine analysis:    Component Value Date/Time   COLORURINE COLORLESS (A) 11/27/2018 0941   APPEARANCEUR CLEAR 11/27/2018 0941   LABSPEC 1.020 11/27/2018 0941   PHURINE 5.0 11/27/2018 0941   GLUCOSEU >=500 (A)  11/27/2018 0941   HGBUR NEGATIVE 11/27/2018 0941   BILIRUBINUR NEGATIVE 11/27/2018 0941   BILIRUBINUR negative 03/11/2018 1613   KETONESUR 20 (A) 11/27/2018 0941   PROTEINUR NEGATIVE 11/27/2018 0941   UROBILINOGEN 0.2 03/11/2018 1613   UROBILINOGEN 0.2 06/14/2014 0809   NITRITE NEGATIVE 11/27/2018 0941   LEUKOCYTESUR NEGATIVE 11/27/2018 0941   Assessment/Plan Active Problems:   Depression   GERD (gastroesophageal reflux disease)   CAD (coronary artery disease)   Hyperlipidemia   AKI (acute kidney injury) (HCC)   Brittle diabetes mellitus (HCC)   DKA (diabetic ketoacidosis) (HCC)   DKA in poorly controlled IDT2DM with peripheral neuropathy: Possibly multifactorial due to nonspecific GI illness and/or decreased insulin administration due to poor po intake. Brittle blood sugars during previous hospitalization and at home. Note VBG pH was compensated by tachypnea. - Insulin infusion started, will continue per protocol and check CBG q1h, serial BMP. - Repeat IVF bolus as he still appears dry, then continue IVF's infusion per orders - Diabetes coordinator consulted - Continue gabapentin  Nausea, vomiting: With known peripheral neuropathy and ongoing poor control, gastroparesis is a very likely contributor to symptoms, though presentation is primarily acute and associated with diarrhea.  - IVF's - Antiemetics  Lightheadedness: Due to dehydration most likely. Note recent admission for work up of similar symptomatology which included negative neuroimaging, EEG  - Check TSH - IVF's as above, check orthostatics in AM - Continue home keppra.  HTN: Has not taken medications today - Restart home metoprolol, but hold lisinopril with AKI and lasix for today, consider restart tomorrow.  AKI: Due to DKA.  - Continue IVF's - Consider further work up if not improving as expected  CAD s/p DES to RCA: Also note preserved EF at recent echo, G1DD currently compensated.  - Monitor I/O, daily  weights to avoid overload with large volume IVF's. - Continue ASA, BB, high-intensity statin  Obesity: BMI 34.  GERD:  - Continue PPI  Chronic pain:  - Continue home medications  DVT prophylaxis: Lovenox  Code Status: Full  Family Communication: None at the bedside Disposition Plan: Home once improved Consults called: None  Admission status: Observation    Tyrone Nineyan B Laydon Martis, MD [facility] [email or web address] Password [email or web address] 11/27/2018, 5:02 PM

## 2018-11-27 NOTE — ED Provider Notes (Signed)
[facility]-EMERGENCY DEPT Provider Note   CSN: [id number] Arrival date & time: 11/27/18  7829     History   Chief Complaint Chief Complaint  Patient presents with  . Hyperglycemia  . Diarrhea  . Emesis    HPI [name] [name] is a 62 y.o. male.  The history is provided by the patient.  Hyperglycemia  Blood sugar level PTA:  >600 Severity:  Severe Onset quality:  Gradual Timing:  Constant Progression:  Unchanged Chronicity:  New Diabetes status:  Controlled with insulin Context: recent illness (diarrhea and nausea over last two days, vomiting today)   Context: not new diabetes diagnosis   Relieved by:  Nothing Ineffective treatments:  Insulin Associated symptoms: dehydration, dizziness, malaise, nausea and weakness   Associated symptoms: no abdominal pain, no chest pain, no dysuria, no fever, no increased appetite, no increased thirst, no shortness of breath, no syncope and no vomiting     Past Medical History:  Diagnosis Date  . Barrett's esophagus   . CAD (coronary artery disease)    a. NSTEMI 05/2014 - occluded RCA dominant proximal s/p asp-thrombectomy/DES to RCA, minimal LAD/LCx, EF 50% by cath, 65-70% by echo.  . Depression   . Diabetes type 2, uncontrolled (HCC)   . Former tobacco use   . GERD (gastroesophageal reflux disease)   . Hemorrhoids    hx of  . Hypercholesterolemia   . Hypertension   . MI (myocardial infarction) (HCC)   . Peripheral neuropathy     Patient Active Problem List   Diagnosis Date Noted  . DKA (diabetic ketoacidosis) (HCC) 11/27/2018  . Chest pain, atypical 11/19/2018  . Dizziness 11/19/2018  . Bilateral low back pain without sciatica 04/12/2018  . Vitamin D deficiency 04/07/2018  . Pill dysphagia 04/07/2018  . Enlarged thyroid gland 04/07/2018  . Labile blood glucose 04/07/2018  . Brittle diabetes mellitus (HCC) 04/07/2018  . AKI (acute kidney injury) (HCC) 09/22/2017  . Chronic pain syndrome 08/18/2016    . Diabetic ketoacidosis (HCC) 04/12/2016  . Hip pain, bilateral 08/23/2015  . Chronic neck pain 08/23/2015  . DKA (diabetic ketoacidoses) (HCC) 05/30/2015  . Acute renal failure (HCC) 05/30/2015  . Restless legs 08/31/2014  . Uncontrolled diabetes mellitus type 2 with peripheral artery disease (HCC) 08/10/2014  . Old myocardial infarction 08/10/2014  . CAD (coronary artery disease) 06/18/2014  . Hyperlipidemia 06/18/2014  . NSTEMI (non-ST elevated myocardial infarction) (HCC) 06/14/2014  . N&V (nausea and vomiting) 07/21/2013  . Chronic pain in left shoulder 04/06/2013  . Peripheral neuropathy 02/28/2012  . Depression 12/25/2011  . GERD (gastroesophageal reflux disease) 12/25/2011  . DM (diabetes mellitus), type 2, uncontrolled (HCC) 11/18/2011    Past Surgical History:  Procedure Laterality Date  . LEFT HEART CATHETERIZATION WITH CORONARY ANGIOGRAM N/A 06/14/2014   Procedure: LEFT HEART CATHETERIZATION WITH CORONARY ANGIOGRAM;  Surgeon: Corky Crafts, MD;  Location: [facility] CATH LAB;  Service: Cardiovascular;  Laterality: N/A;  . TONSILLECTOMY          Home Medications    Prior to Admission medications   Medication Sig Start Date End Date Taking? Authorizing Provider  EQ ASPIRIN ADULT LOW DOSE 81 MG EC tablet TAKE ONE TABLET BY MOUTH ONCE DAILY 07/02/15  Yes Corky Crafts, MD  gabapentin (NEURONTIN) 100 MG capsule TAKE 2 CAPSULES BY MOUTH EVERY MORNING AND AT BEDTIME Patient taking differently: Take 100 mg by mouth 2 (two) times daily. TAKE 2 CAPSULES BY MOUTH EVERY MORNING AND AT BEDTIME 09/09/18  Yes Jones Baleshomas, Eunice L, NP  HYDROcodone-acetaminophen (NORCO) 7.5-325 MG tablet Take 1 tablet by mouth every 6 (six) hours as needed for moderate pain. 10/03/18  Yes Jones Baleshomas, Eunice L, NP  insulin aspart (NOVOLOG FLEXPEN) 100 UNIT/ML FlexPen INJECT 30 TO 35 UNITS SUBCUTANEOUSLY THREE TIMES DAILY Patient taking differently: Inject 15-30 Units into the skin 3 (three) times daily  with meals. Per sliding scale 03/11/18  Yes Sherren MochaShaw, Eva N, MD  LEVEMIR FLEXTOUCH 100 UNIT/ML Pen INJECT 45 UNITS UNDER THE SKIN EVERY MORNING AND 40 UNITS EVERY NIGHT AT BEDTIME Patient taking differently: Inject 40-45 Units into the skin See admin instructions. INJECT 45 UNITS UNDER THE SKIN EVERY MORNING AND 40 UNITS EVERY NIGHT AT BEDTIME 09/28/18  Yes Sherren MochaShaw, Eva N, MD  lisinopril (PRINIVIL,ZESTRIL) 2.5 MG tablet TAKE 1 TABLET(2.5 MG) BY MOUTH DAILY Patient taking differently: Take 2.5 mg by mouth daily.  09/09/18  Yes Sherren MochaShaw, Eva N, MD  metoprolol tartrate (LOPRESSOR) 25 MG tablet TAKE 1/2 TABLET(12.5 MG) BY MOUTH TWICE DAILY Patient taking differently: Take 12.5 mg by mouth 2 (two) times daily.  09/12/18  Yes Sherren MochaShaw, Eva N, MD  nitroGLYCERIN (NITROSTAT) 0.4 MG SL tablet Place 1 tablet (0.4 mg total) under the tongue every 5 (five) minutes as needed for chest pain (up to 3 doses). 11/10/16  Yes Corky CraftsVaranasi, Jayadeep S, MD  pantoprazole (PROTONIX) 40 MG tablet TAKE 1 TABLET BY MOUTH EVERY DAY 09/26/18  Yes Sherren MochaShaw, Eva N, MD  rosuvastatin (CRESTOR) 40 MG tablet TAKE 1 TABLET(40 MG) BY MOUTH DAILY Patient taking differently: Take 40 mg by mouth daily.  09/09/18  Yes Sherren MochaShaw, Eva N, MD  furosemide (LASIX) 20 MG tablet Take 1 tablet (20 mg total) by mouth daily. 11/12/18   Eber HongMiller, Brian, MD  ibuprofen (ADVIL,MOTRIN) 400 MG tablet Take 1 tablet (400 mg total) by mouth every 6 (six) hours as needed. Patient not taking: Reported on 11/19/2018 09/06/18   Lorre NickAllen, Anthony, MD  levETIRAcetam (KEPPRA) 500 MG tablet Take 1 tablet (500 mg total) by mouth 2 (two) times daily. 11/22/18   GhimireWerner Lean, Shanker M, MD    Family History Family History  Problem Relation Age of Onset  . Hypertension Mother   . Alzheimer's disease Mother   . Alcohol abuse Father   . Cancer Father [id number]       "Throat"  . Diabetes type II Brother   . Cancer Brother 68       throat cancer  . Heart disease Neg Hx     Social History Social History    Tobacco Use  . Smoking status: Former Smoker    Packs/day: 0.25    Years: 3.00    Pack years: 0.75    Last attempt to quit: 11/30/1998    Years since quitting: 20.0  . Smokeless tobacco: Never Used  Substance Use Topics  . Alcohol use: No    Alcohol/week: 1.0 standard drinks    Types: 1 Standard drinks or equivalent per week  . Drug use: No     Allergies   Patient has no known allergies.   Review of Systems Review of Systems  Constitutional: Negative for chills and fever.  HENT: Negative for ear pain and sore throat.   Eyes: Negative for pain and visual disturbance.  Respiratory: Negative for cough and shortness of breath.   Cardiovascular: Negative for chest pain, palpitations and syncope.  Gastrointestinal: Positive for diarrhea and nausea. Negative for abdominal pain and vomiting.  Endocrine: Negative for polydipsia.  Genitourinary: Negative  for dysuria and hematuria.  Musculoskeletal: Negative for arthralgias and back pain.  Skin: Negative for color change and rash.  Neurological: Positive for dizziness and weakness. Negative for seizures and syncope.  All other systems reviewed and are negative.    Physical Exam Updated Vital Signs  ED Triage Vitals [11/27/18 0937]  Enc Vitals Group     BP (!) 171/71     Pulse Rate (!) 105     Resp 19     Temp 98.9 F (37.2 C)     Temp Source Oral     SpO2 98 %     Weight      Height      Head Circumference      Peak Flow      Pain Score      Pain Loc      Pain Edu?      Excl. in GC?     Physical Exam Vitals signs and nursing note reviewed.  Constitutional:      General: He is not in acute distress.    Appearance: He is well-developed. He is ill-appearing.  HENT:     Head: Normocephalic and atraumatic.     Nose: Nose normal.     Mouth/Throat:     Mouth: Mucous membranes are dry.  Eyes:     Extraocular Movements: Extraocular movements intact.     Conjunctiva/sclera: Conjunctivae normal.     Pupils: Pupils  are equal, round, and reactive to light.  Neck:     Musculoskeletal: Neck supple.  Cardiovascular:     Rate and Rhythm: Regular rhythm. Tachycardia present.     Pulses: Normal pulses.     Heart sounds: Normal heart sounds. No murmur.  Pulmonary:     Effort: Pulmonary effort is normal. No respiratory distress.     Breath sounds: Normal breath sounds.  Abdominal:     Palpations: Abdomen is soft.     Tenderness: There is no abdominal tenderness.  Musculoskeletal: Normal range of motion.  Skin:    General: Skin is warm and dry.     Capillary Refill: Capillary refill takes less than 2 seconds.  Neurological:     General: No focal deficit present.     Mental Status: He is alert and oriented to person, place, and time.     Cranial Nerves: No cranial nerve deficit.     Sensory: No sensory deficit.     Motor: No weakness.     Coordination: Coordination normal.     Comments: 5+/5 strength, normal sensation, no drift, normal finger to nose finger      ED Treatments / Results  Labs (all labs ordered are listed, but only abnormal results are displayed) Labs Reviewed  COMPREHENSIVE METABOLIC PANEL - Abnormal; Notable for the following components:      Result Value   Sodium 132 (*)    CO2 17 (*)    Glucose, Bld 668 (*)    Calcium 8.7 (*)    Alkaline Phosphatase 133 (*)    Anion gap 16 (*)    All other components within normal limits  CBC - Abnormal; Notable for the following components:   Hemoglobin 12.8 (*)    All other components within normal limits  URINALYSIS, ROUTINE W REFLEX MICROSCOPIC - Abnormal; Notable for the following components:   Color, Urine COLORLESS (*)    Glucose, UA >=500 (*)    Ketones, ur 20 (*)    All other components within normal limits  BLOOD GAS,  VENOUS - Abnormal; Notable for the following components:   pCO2, Ven 32.9 (*)    pO2, Ven 48.7 (*)    Bicarbonate 16.7 (*)    Acid-base deficit 8.0 (*)    All other components within normal limits  CBG  MONITORING, ED - Abnormal; Notable for the following components:   Glucose-Capillary >600 (*)    All other components within normal limits  CBG MONITORING, ED - Abnormal; Notable for the following components:   Glucose-Capillary 551 (*)    All other components within normal limits  LIPASE, BLOOD    EKG EKG Interpretation  Date/Time:  Sunday November 27 2018 10:26:12 EST Ventricular Rate:  100 PR Interval:    QRS Duration: 79 QT Interval:  335 QTC Calculation: 432 R Axis:   18 Text Interpretation:  Sinus tachycardia unchanged from prior Confirmed by Virgina Norfolk [phone]) on 11/27/2018 11:11:22 AM   Radiology No results found.  Procedures .Critical Care Performed by: Virgina Norfolk, DO Authorized by: Virgina Norfolk, DO   Critical care provider statement:    Critical care time (minutes):  35   Critical care time was exclusive of:  Separately billable procedures and treating other patients and teaching time   Critical care was necessary to treat or prevent imminent or life-threatening deterioration of the following conditions:  Metabolic crisis and dehydration   Critical care was time spent personally by me on the following activities:  Development of treatment plan with patient or surrogate, discussions with primary provider, evaluation of patient's response to treatment, obtaining history from patient or surrogate, ordering and performing treatments and interventions, ordering and review of laboratory studies, ordering and review of radiographic studies, pulse oximetry, re-evaluation of patient's condition and review of old charts   I assumed direction of critical care for this patient from another provider in my specialty: no     (including critical care time)  Medications Ordered in ED Medications  HYDROcodone-acetaminophen (NORCO) 7.5-325 MG per tablet 1 tablet (1 tablet Oral Given 11/27/18 1345)  insulin regular, human (MYXREDLIN) 100 units/ 100 mL infusion (has no  administration in time range)  ondansetron (ZOFRAN) injection 4 mg (4 mg Intravenous Given 11/27/18 1046)  sodium chloride 0.9 % bolus 1,000 mL (0 mLs Intravenous Stopped 11/27/18 1240)  sodium chloride 0.9 % bolus 1,000 mL (0 mLs Intravenous Stopped 11/27/18 1304)  ondansetron (ZOFRAN) injection 4 mg (4 mg Intravenous Given 11/27/18 1314)     Initial Impression / Assessment and Plan / ED Course  I have reviewed the triage vital signs and the nursing notes.  Pertinent labs & imaging results that were available during my care of the patient were reviewed by me and considered in my medical decision making (see chart for details).     [name] [name] is a 62 year old male with history of depression, diabetes, high cholesterol, hypertension who presents to the ED with weakness, nausea, vomiting, diarrhea.  Patient with unremarkable vitals except for mild tachycardia.  Patient with symptoms over the last 2 days.  Has felt generally weak.  States that he checked his blood sugar at home and was greater than 600 even despite taking his insulin.  Denies any recent antibiotics, food exposures.  Does not have any particular tenderness of his abdomen on exam.  He looks clinically dehydrated.  Concern for DKA likely from viral process versus gastroparesis.  Will give normal saline bolus, IV Zofran.  And reevaluate the patient.  Patient with ketones, blood sugar greater than 600 but normal pH.  Patient is not in DKA but appears to be dehydrated.  He was given additional normal saline liter bolus and blood sugar was rechecked and was still in the 500s.  At that time patient still symptomatic with nausea, unable to tolerate p.o. and will start IV insulin drip and admit the patient for further hydration and glucose control.  Patient with no significant leukocytosis, anemia.  Patient admitted in stable condition.  Suspect likely viral process versus gastroparesis causing hyperglycemia and metabolic  derangements.  This chart was dictated using voice recognition software.  Despite best efforts to proofread,  errors can occur which can change the documentation meaning.   Final Clinical Impressions(s) / ED Diagnoses   Final diagnoses:  Nausea vomiting and diarrhea  Hyperglycemia  Dehydration    ED Discharge Orders    None       Virgina Norfolk, DO 11/27/18 1354

## 2018-11-27 NOTE — ED Triage Notes (Signed)
Pt c/o n/v/d and dizziness since last night. Reports stomach "feels weird but not painful".

## 2018-11-27 NOTE — ED Notes (Signed)
Report given to [place] [facility] for 2W, Room 1222.

## 2018-11-28 DIAGNOSIS — Z9114 Patient's other noncompliance with medication regimen: Secondary | ICD-10-CM | POA: Diagnosis not present

## 2018-11-28 DIAGNOSIS — Z5900 Homelessness unspecified: Secondary | ICD-10-CM

## 2018-11-28 DIAGNOSIS — E0810 Diabetes mellitus due to underlying condition with ketoacidosis without coma: Secondary | ICD-10-CM

## 2018-11-28 DIAGNOSIS — N179 Acute kidney failure, unspecified: Secondary | ICD-10-CM | POA: Diagnosis not present

## 2018-11-28 LAB — GLUCOSE, CAPILLARY
Glucose-Capillary: 187 mg/dL — ABNORMAL HIGH (ref 70–99)
Glucose-Capillary: 207 mg/dL — ABNORMAL HIGH (ref 70–99)
Glucose-Capillary: 256 mg/dL — ABNORMAL HIGH (ref 70–99)
Glucose-Capillary: 75 mg/dL (ref 70–99)
Glucose-Capillary: 79 mg/dL (ref 70–99)

## 2018-11-28 LAB — BASIC METABOLIC PANEL
Anion gap: 8 (ref 5–15)
Anion gap: 10 (ref 5–15)
Anion gap: 6 (ref 5–15)
BUN: 13 mg/dL (ref 6–20)
BUN: 13 mg/dL (ref 6–20)
BUN: 13 mg/dL (ref 6–20)
CO2: 19 mmol/L — ABNORMAL LOW (ref 22–32)
CO2: 18 mmol/L — ABNORMAL LOW (ref 22–32)
CO2: 20 mmol/L — ABNORMAL LOW (ref 22–32)
CREATININE: 1.1 mg/dL (ref 0.61–1.24)
Calcium: 7.9 mg/dL — ABNORMAL LOW (ref 8.9–10.3)
Calcium: 8 mg/dL — ABNORMAL LOW (ref 8.9–10.3)
Calcium: 8 mg/dL — ABNORMAL LOW (ref 8.9–10.3)
Chloride: 112 mmol/L — ABNORMAL HIGH (ref 98–111)
Chloride: 110 mmol/L (ref 98–111)
Chloride: 110 mmol/L (ref 98–111)
Creatinine, Ser: 1.07 mg/dL (ref 0.61–1.24)
Creatinine, Ser: 0.95 mg/dL (ref 0.61–1.24)
GFR calc Af Amer: >60 mL/min (ref >60)
GFR calc Af Amer: >60 mL/min (ref >60)
GFR calc non Af Amer: >60 mL/min (ref >60)
GFR calc non Af Amer: >60 mL/min (ref >60)
GFR calc non Af Amer: >60 mL/min (ref >60)
Glucose, Bld: 160 mg/dL — ABNORMAL HIGH (ref 70–99)
Glucose, Bld: 198 mg/dL — ABNORMAL HIGH (ref 70–99)
Glucose, Bld: 310 mg/dL — ABNORMAL HIGH (ref 70–99)
Potassium: 5.1 mmol/L (ref 3.5–5.1)
Potassium: 4.3 mmol/L (ref 3.5–5.1)
Potassium: 4.4 mmol/L (ref 3.5–5.1)
Sodium: 139 mmol/L (ref 135–145)
Sodium: 138 mmol/L (ref 135–145)
Sodium: 136 mmol/L (ref 135–145)

## 2018-11-28 MED ORDER — FUROSEMIDE 20 MG PO TABS: 20.0000 mg | ORAL_TABLET | ORAL | 0 refills | Status: DC | PRN

## 2018-11-28 NOTE — Discharge Summary (Signed)
Discharge Summary  DOMANI BAKOS ZOX:[id number] DOB: Mar 30, 1956  PCP: Sherren Mocha, MD  Admit date: 11/27/2018 Discharge date: 11/28/2018  Time spent: , more than 50% time spent on coordination of care. Case discussed with social worker/case manager/diabetes  Patient is not forthcoming with his social situation, he eventually admits that he lives in his car during school break, he will return to school dorm on January to 6.  Social worker and Sports coach consulted for Geophysicist/field seismologist.  Recommendations for Outpatient Follow-up:  1. F/u with PCP  Dr [name] on 1/10   for hospital discharge follow up, repeat cbc/bmp at follow up. 2. F/u with cardiology on 1/21 3. F/u with pain management Dr Riley Kill  on 1/6  Discharge Diagnoses:  Active Hospital Problems   Diagnosis Date Noted  . DKA (diabetic ketoacidosis) (HCC) 11/27/2018  . Brittle diabetes mellitus (HCC) 04/07/2018  . AKI (acute kidney injury) (HCC) 09/22/2017  . CAD (coronary artery disease) 06/18/2014  . Hyperlipidemia 06/18/2014  . Depression 12/25/2011  . GERD (gastroesophageal reflux disease) 12/25/2011    Resolved Hospital Problems  No resolved problems to display.    Discharge Condition: stable  Diet recommendation: heart healthy/carb modified  Filed Weights   11/27/18 1514  Weight: 106.3 kg    History of present illness: (per admitting MD Dr Jarvis Newcomer) PCP: Sherren Mocha, MD  Patient coming from: Home  Chief Complaint: Emesis, abdominal discomfort  HPI: ARDA KEADLE is a 62 y.o. male with a history of CAD s/p DES to RCA, uncontrolled IDT2DM with neuropathy, GERD with Barrett's esophagus, HTN, HLD and depression who presented to the ED 12/29 with 2 days of abdominal discomfort. This is vaguely described as a gradual onset of an abnormal churning sensation with nausea which he first noticed 2 days ago when he had a loose stool. He's had a normal stool since, but also developed emesis of stomach contents yesterday. This  was associated with poor po intake so he's not been taking insulin as usual. He's been checking blood sugars which have ranged from the 60's to the 500's at home with hypoglycemic symptoms below 70mg /dl and has had waxing/waning blurry vision, polyuria and polydipsia.  ED Course: On arrival he was significantly hyperglycemic with elevated anion gap, low bicarbonate, and ketonuria consistent with DKA. He was given 2L NS with minimal improvement in glucose but some improvement in nausea. He still has no appetite.  Hospital Course:  Principal Problem:   DKA (diabetic ketoacidosis) (HCC) Active Problems:   Depression   GERD (gastroesophageal reflux disease)   CAD (coronary artery disease)   Hyperlipidemia   AKI (acute kidney injury) (HCC)   Brittle diabetes mellitus (HCC)   DKA in poorly controlled IDT2DM with peripheral neuropathy:  -he reports run out levemir PTA -anion gap closed after insulin drip and ivf, he is transitioned back to home regimen insulin -diabetes education provided, case manager/social worker consulted for asisstance -he is discharged, f/u with pcp on 1/10. - Continue gabapentin  Nausea, vomiting:  No leukocytosis, no fever Resolved, possible due to DKA+/-  gastroparesis.   Lightheadedness: Due to dehydration most likely. Note recent admission for work up of similar symptomatology which included negative neuroimaging, EEG  - resolved with ivf - Continue home keppra.  HTN:  Continue metoprolol Home meds lisinopril /lasix held in the hospital, resume lisinopril at discharge, change lasix to prn for edema ( he has not taking it PTA)    AKI on CKD II:  -aki Due to DKA.  -  cr 1.39 on presentation, cr down to 1.07 after hydration   CAD s/p DES to RCA: Also note preserved EF at recent echo, G1DD currently compensated.  - Monitor I/O, daily weights to avoid overload with large volume IVF's. - Continue ASA, BB, high-intensity statin -f/u with  cardiology  Obesity: BMI 34.  GERD:  - Continue PPI  Chronic pain:  - Continue home medications -f/u with pain clinic  DVT prophylaxis: Lovenox  Code Status: Full   Procedures:  none  Consultations:  Diabetes coordinator  Social worker  Case manager  Discharge Exam: BP 135/71   Pulse 84   Temp 98.3 F (36.8 C) (Oral)   Resp 15   Ht 5\' 9"  (1.753 m)   Wt 106.3 kg   SpO2 95%   BMI 34.61 kg/m   General: NAD Cardiovascular: RRR Respiratory: CTABL  Discharge Instructions You were cared for by a hospitalist during your hospital stay. If you have any questions about your discharge medications or the care you received while you were in the hospital after you are discharged, you can call the unit and asked to speak with the hospitalist on call if the hospitalist that took care of you is not available. Once you are discharged, your primary care physician will handle any further medical issues. Please note that NO REFILLS for any discharge medications will be authorized once you are discharged, as it is imperative that you return to your primary care physician (or establish a relationship with a primary care physician if you do not have one) for your aftercare needs so that they can reassess your need for medications and monitor your lab values.  Discharge Instructions    Diet - low sodium heart healthy   Complete by:  As directed    Carb modified diet   Increase activity slowly   Complete by:  As directed      Allergies as of 11/28/2018   No Known Allergies     Medication List    TAKE these medications   EQ ASPIRIN ADULT LOW DOSE 81 MG EC tablet Generic drug:  aspirin TAKE ONE TABLET BY MOUTH ONCE DAILY   furosemide 20 MG tablet Commonly known as:  LASIX Take 1 tablet (20 mg total) by mouth every other day as needed for fluid or edema. What changed:    when to take this  reasons to take this   gabapentin 100 MG capsule Commonly known as:   NEURONTIN TAKE 2 CAPSULES BY MOUTH EVERY MORNING AND AT BEDTIME What changed:    how much to take  how to take this  when to take this  additional instructions   HYDROcodone-acetaminophen 7.5-325 MG tablet Commonly known as:  NORCO Take 1 tablet by mouth every 6 (six) hours as needed for moderate pain.   ibuprofen 400 MG tablet Commonly known as:  ADVIL,MOTRIN Take 1 tablet (400 mg total) by mouth every 6 (six) hours as needed.   insulin aspart 100 UNIT/ML FlexPen Commonly known as:  NOVOLOG FLEXPEN INJECT 30 TO 35 UNITS SUBCUTANEOUSLY THREE TIMES DAILY What changed:    how much to take  how to take this  when to take this  additional instructions   LEVEMIR FLEXTOUCH 100 UNIT/ML Pen Generic drug:  Insulin Detemir INJECT 45 UNITS UNDER THE SKIN EVERY MORNING AND 40 UNITS EVERY NIGHT AT BEDTIME What changed:    how much to take  how to take this  when to take this   levETIRAcetam 500 MG  tablet Commonly known as:  KEPPRA Take 1 tablet (500 mg total) by mouth 2 (two) times daily.   lisinopril 2.5 MG tablet Commonly known as:  PRINIVIL,ZESTRIL TAKE 1 TABLET(2.5 MG) BY MOUTH DAILY What changed:  See the new instructions.   metoprolol tartrate 25 MG tablet Commonly known as:  LOPRESSOR TAKE 1/2 TABLET(12.5 MG) BY MOUTH TWICE DAILY What changed:  See the new instructions.   nitroGLYCERIN 0.4 MG SL tablet Commonly known as:  NITROSTAT Place 1 tablet (0.4 mg total) under the tongue every 5 (five) minutes as needed for chest pain (up to 3 doses).   pantoprazole 40 MG tablet Commonly known as:  PROTONIX TAKE 1 TABLET BY MOUTH EVERY DAY   rosuvastatin 40 MG tablet Commonly known as:  CRESTOR TAKE 1 TABLET(40 MG) BY MOUTH DAILY What changed:  See the new instructions.      No Known Allergies    The results of significant diagnostics from this hospitalization (including imaging, microbiology, ancillary and laboratory) are listed below for reference.     Significant Diagnostic Studies: Ct Angio Head W Or Wo Contrast  Result Date: 11/20/2018 CLINICAL DATA:  Dizziness and lightheadedness. Weakness. Symptoms began yesterday. EXAM: CT ANGIOGRAPHY HEAD AND NECK TECHNIQUE: Multidetector CT imaging of the head and neck was performed using the standard protocol during bolus administration of intravenous contrast. Multiplanar CT image reconstructions and MIPs were obtained to evaluate the vascular anatomy. Carotid stenosis measurements (when applicable) are obtained utilizing NASCET criteria, using the distal internal carotid diameter as the denominator. CONTRAST:  27mL ISOVUE-370 IOPAMIDOL (ISOVUE-370) INJECTION 76% COMPARISON:  MRI and CT studies done yesterday. FINDINGS: CT HEAD FINDINGS Brain: Mild age related atrophy. No evidence of old or acute infarction, mass lesion, hemorrhage, hydrocephalus or extra-axial collection. Vascular: Minor calcification at the base of the brain. Skull: Normal Sinuses: Clear Orbits: Normal Review of the MIP images confirms the above findings CTA NECK FINDINGS Aortic arch: Normal Right carotid system: Common carotid artery widely patent to the bifurcation. Carotid bifurcation is normal without soft or calcified plaque. Cervical ICA is normal. Left carotid system: Common carotid artery widely patent to the bifurcation. Carotid bifurcation is normal without soft or calcified plaque. Cervical ICA is normal. Vertebral arteries: Both vertebral arteries are widely patent at their origins, though detail is limited on the right because of streak artifact. Both vertebral arteries appear patent through the cervical region to the foramen magnum. Skeleton: Mid cervical spondylosis. Ordinary cervical facet arthropathy. Other neck: No mass or lymphadenopathy. Upper chest: Negative Review of the MIP images confirms the above findings CTA HEAD FINDINGS Anterior circulation: Internal carotid arteries are widely patent through the skull base and  siphon regions. The anterior and middle cerebral vessels are patent without proximal stenosis, aneurysm or vascular malformation. No embolic occlusions are identified. Posterior circulation: Both vertebral arteries are widely patent to the basilar. No basilar stenosis. Posterior circulation vessels appear normal without evidence of embolic occlusion. Venous sinuses: Patent and normal. Anatomic variants: None significant. Delayed phase: No abnormal enhancement. Review of the MIP images confirms the above findings IMPRESSION: Normal CT angiography of the neck and head. Electronically Signed   By: Paulina Fusi M.D.   On: 11/20/2018 16:45   Dg Chest 2 View  Result Date: 11/20/2018 CLINICAL DATA:  Dizziness, lightheadedness, back pain, history GERD, coronary artery disease, type II diabetes mellitus, hypertension EXAM: CHEST - 2 VIEW COMPARISON:  09/29/2017 FINDINGS: Minimal enlargement of cardiac silhouette. Mediastinal contours and pulmonary vascularity normal. Lungs clear.  No pleural effusion or pneumothorax. Bones unremarkable. IMPRESSION: No acute abnormalities. Electronically Signed   By: Ulyses Southward M.D.   On: 11/20/2018 16:39   Ct Head Wo Contrast  Result Date: 11/19/2018 CLINICAL DATA:  Syncopal episode last night, dizziness, history heart disease, type II diabetes mellitus, coronary artery disease post MI, hypertension, former smoker EXAM: CT HEAD WITHOUT CONTRAST TECHNIQUE: Contiguous axial images were obtained from the base of the skull through the vertex without intravenous contrast. Sagittal and coronal MPR images reconstructed from axial data set. COMPARISON:  09/06/2018 FINDINGS: Brain: Normal ventricular morphology. No midline shift or mass effect. Normal appearance of brain parenchyma. No intracranial hemorrhage, mass lesion, evidence of acute infarction, or extra-axial fluid collection. Vascular: No hyperdense vessels Skull: Intact, unremarkable Sinuses/Orbits: Clear Other: N/A IMPRESSION:  Normal exam. Electronically Signed   By: Ulyses Southward M.D.   On: 11/19/2018 11:53   Ct Angio Neck W Or Wo Contrast  Result Date: 11/20/2018 CLINICAL DATA:  Dizziness and lightheadedness. Weakness. Symptoms began yesterday. EXAM: CT ANGIOGRAPHY HEAD AND NECK TECHNIQUE: Multidetector CT imaging of the head and neck was performed using the standard protocol during bolus administration of intravenous contrast. Multiplanar CT image reconstructions and MIPs were obtained to evaluate the vascular anatomy. Carotid stenosis measurements (when applicable) are obtained utilizing NASCET criteria, using the distal internal carotid diameter as the denominator. CONTRAST:  80mL ISOVUE-370 IOPAMIDOL (ISOVUE-370) INJECTION 76% COMPARISON:  MRI and CT studies done yesterday. FINDINGS: CT HEAD FINDINGS Brain: Mild age related atrophy. No evidence of old or acute infarction, mass lesion, hemorrhage, hydrocephalus or extra-axial collection. Vascular: Minor calcification at the base of the brain. Skull: Normal Sinuses: Clear Orbits: Normal Review of the MIP images confirms the above findings CTA NECK FINDINGS Aortic arch: Normal Right carotid system: Common carotid artery widely patent to the bifurcation. Carotid bifurcation is normal without soft or calcified plaque. Cervical ICA is normal. Left carotid system: Common carotid artery widely patent to the bifurcation. Carotid bifurcation is normal without soft or calcified plaque. Cervical ICA is normal. Vertebral arteries: Both vertebral arteries are widely patent at their origins, though detail is limited on the right because of streak artifact. Both vertebral arteries appear patent through the cervical region to the foramen magnum. Skeleton: Mid cervical spondylosis. Ordinary cervical facet arthropathy. Other neck: No mass or lymphadenopathy. Upper chest: Negative Review of the MIP images confirms the above findings CTA HEAD FINDINGS Anterior circulation: Internal carotid arteries  are widely patent through the skull base and siphon regions. The anterior and middle cerebral vessels are patent without proximal stenosis, aneurysm or vascular malformation. No embolic occlusions are identified. Posterior circulation: Both vertebral arteries are widely patent to the basilar. No basilar stenosis. Posterior circulation vessels appear normal without evidence of embolic occlusion. Venous sinuses: Patent and normal. Anatomic variants: None significant. Delayed phase: No abnormal enhancement. Review of the MIP images confirms the above findings IMPRESSION: Normal CT angiography of the neck and head. Electronically Signed   By: Paulina Fusi M.D.   On: 11/20/2018 16:45   Mr Laqueta Jean UJ Contrast  Result Date: 11/19/2018 CLINICAL DATA:  Dizziness. Lightheaded. Weakness beginning last night EXAM: MRI HEAD WITHOUT AND WITH CONTRAST TECHNIQUE: Multiplanar, multiecho pulse sequences of the brain and surrounding structures were obtained without and with intravenous contrast. CONTRAST:  10 cc Gadavist COMPARISON:  Head CT same day. FINDINGS: Brain: Mild age related volume loss. No evidence of old or acute small or large vessel infarction, mass lesion, hemorrhage, hydrocephalus or  extra-axial collection. Vascular: Major vessels at the base of the brain show flow. Skull and upper cervical spine: Negative Sinuses/Orbits: Clear/normal Other: None IMPRESSION: Mild age related volume loss.  No acute or focal finding. Electronically Signed   By: Paulina FusiMark  Shogry M.D.   On: 11/19/2018 17:29   [facility] [place]  Result Date: 11/20/2018 Carotid Arterial Duplex Study Indications: Syncope. Performing Technologist: Chanda BusingGregory Collins RVT  Examination Guidelines: A complete evaluation includes B-mode imaging, spectral Doppler, color Doppler, and power Doppler as needed of all accessible portions of each vessel. Bilateral testing is considered an integral part of a complete examination. Limited examinations  for reoccurring indications may be performed as noted.  Right Carotid Findings: +----------+--------+--------+--------+-----------------------+--------+           PSV cm/sEDV cm/sStenosisDescribe               Comments +----------+--------+--------+--------+-----------------------+--------+ CCA Prox  132     17              smooth and heterogenous         +----------+--------+--------+--------+-----------------------+--------+ CCA Distal118     24              smooth and heterogenous         +----------+--------+--------+--------+-----------------------+--------+ ICA Prox  107     13                                              +----------+--------+--------+--------+-----------------------+--------+ ICA Distal87      27                                     tortuous +----------+--------+--------+--------+-----------------------+--------+ ECA       157     17                                              +----------+--------+--------+--------+-----------------------+--------+ +----------+--------+-------+--------+-------------------+           PSV cm/sEDV cmsDescribeArm Pressure (mmHG) +----------+--------+-------+--------+-------------------+ [id number]                                        +----------+--------+-------+--------+-------------------+ +---------+--------+--+--------+--+---------+ VertebralPSV cm/s64EDV cm/s13Antegrade +---------+--------+--+--------+--+---------+  Left Carotid Findings: +----------+--------+--------+--------+-----------------------+--------+           PSV cm/sEDV cm/sStenosisDescribe               Comments +----------+--------+--------+--------+-----------------------+--------+ CCA Prox  154     16              smooth and heterogenous         +----------+--------+--------+--------+-----------------------+--------+ CCA Distal122     21              smooth and heterogenous          +----------+--------+--------+--------+-----------------------+--------+ ICA Prox  117     20              smooth and heterogenous         +----------+--------+--------+--------+-----------------------+--------+ ICA Distal102     27                                              +----------+--------+--------+--------+-----------------------+--------+  ECA       129     10                                              +----------+--------+--------+--------+-----------------------+--------+ +----------+--------+--------+--------+-------------------+ SubclavianPSV cm/sEDV cm/sDescribeArm Pressure (mmHG) +----------+--------+--------+--------+-------------------+           172                                         +----------+--------+--------+--------+-------------------+ +---------+--------+--+--------+--+---------+ VertebralPSV cm/s69EDV cm/s13Antegrade +---------+--------+--+--------+--+---------+  Summary: Right Carotid: Velocities in the right ICA are consistent with a 1-39% stenosis. Left Carotid: Velocities in the left ICA are consistent with a 1-39% stenosis. Vertebrals: Bilateral vertebral arteries demonstrate antegrade flow. *See table(s) above for measurements and observations.  Electronically signed by Sherald Hess MD on 11/20/2018 at 10:42:12 AM.    Final     Microbiology: No results found for this or any previous visit (from the past 240 hour(s)).   Labs: Basic Metabolic Panel: Recent Labs  Lab 11/27/18 1458 11/27/18 1843 11/27/18 2208 11/28/18 0220 11/28/18 0649  NA 133* 137 138 139 138  K 5.0 4.1 4.3 5.1 4.3  CL 103 111 112* 112* 110  CO2 12* 16* 20* 19* 18*  GLUCOSE 606* 312* 129* 160* 198*  BUN 20 18 14 13 13   CREATININE 1.39* 1.35* 1.23 1.10 1.07  CALCIUM 8.4* 8.2* 8.1* 7.9* 8.0*   Liver Function Tests: Recent Labs  Lab 11/27/18 1039  AST 26  ALT 21  ALKPHOS 133*  BILITOT 0.9  PROT 6.9  ALBUMIN 3.9   Recent Labs  Lab  11/27/18 1039  LIPASE 31   No results for input(s): AMMONIA in the last 168 hours. CBC: Recent Labs  Lab 11/27/18 1039  WBC 9.4  HGB 12.8*  HCT 39.9  MCV 88.1  PLT 342   Cardiac Enzymes: No results for input(s): CKTOTAL, CKMB, CKMBINDEX, TROPONINI in the last 168 hours. BNP: BNP (last 3 results) Recent Labs    11/12/18 0529 11/19/18 0819  BNP 15.0 10.2    ProBNP (last 3 results) No results for input(s): PROBNP in the last 8760 hours.  CBG: Recent Labs  Lab 11/27/18 2321 11/28/18 0019 11/28/18 0118 11/28/18 0353 11/28/18 0743  GLUCAP 99 75 79 207* 187*       Signed:  Albertine Grates MD, PhD  [facility] 11/28/2018, 11:11 AM

## 2018-11-28 NOTE — Clinical Social Work Note (Signed)
Clinical Social Work Assessment  Patient Details  Name: [name] [name] MRN: [id number] Date of Birth: 10/08/1956  Date of referral:  11/28/18               Reason for consult:  Medication Concerns, Housing Concerns/Homelessness                Permission sought to share information with:    Permission granted to share information::  No  Name::        Agency::     Relationship::     Contact Information:     Housing/Transportation Living arrangements for the past 2 months:  Dorm Room, Homeless Source of Information:  Patient, Medical Team Patient Interpreter Needed:  None Criminal Activity/Legal Involvement Pertinent to Current Situation/Hospitalization:  No - Comment as needed Significant Relationships:  None Lives with:  Self Do you feel safe going back to the place where you live?  Yes Need for family participation in patient care:  Yes (Comment)  Care giving concerns:  Patient is insulin dependent and currently living in his car.    Social Worker assessment / plan:  LCSW consulted for med storage and homeless issues.   Patient was recently at [facility].  Per CSW note patient is a Air traffic controller at [employer]. He is currently living in his car due to dorms being closed. Patients family is in [place].   LCSW met at bed side with patient. Patient confirms housing status. LCSW explored options for patient to store insulin, i.e. refrigerator at work, cooler and ice pack, etc.   Patient states that he is financially able to afford a hotel for a few nights. He gets paid on 12/02/2018 and dorms open 12/05/2018.   According to diabetes coordinator, patients insulin pin can be stored at room temp for up to 28 days once used.   PLAN: Patient will dc to hotel. Patient has plan to store insulin at dc.    Employment status:  Contractor PT Recommendations:    Information / Referral to community resources:     Patient/Family's Response to care:  Patient is  thankful for LCSW visit.   Patient/Family's Understanding of and Emotional Response to Diagnosis, Current Treatment, and Prognosis:  Patient is realistic about his current situation. Patient states this is the first time he has been in this situation. Patient was responsive to solutions and ultimately decided to get a hotel room.    Emotional Assessment Appearance:  Appears stated age Attitude/Demeanor/Rapport:    Affect (typically observed):  Calm Orientation:  Oriented to Self, Oriented to Place, Oriented to  Time, Oriented to Situation Alcohol / Substance use:  Not Applicable Psych involvement (Current and /or in the community):  No (Comment)  Discharge Needs  Concerns to be addressed:  No discharge needs identified Readmission within the last 30 days:  Yes Current discharge risk:  Homeless Barriers to Discharge:  No Barriers Identified   Servando Snare, LCSW 11/28/2018, 11:52 AM

## 2018-11-28 NOTE — Progress Notes (Signed)
Inpatient Diabetes Program Recommendations  AACE/ADA: New Consensus Statement on Inpatient Glycemic Control (2015)  Target Ranges:  Prepandial:   less than 140 mg/dL      Peak postprandial:   less than 180 mg/dL (1-2 hours)      Critically ill patients:  140 - 180 mg/dL   Lab Results  Component Value Date   GLUCAP 187 (H) 11/28/2018   HGBA1C 8.2 (H) 11/20/2018    Review of Glycemic Control  Diabetes history: DM2 Outpatient Diabetes medications: Levemir 45 units in am and 40 units QHS, Novolog 15-30 units tidwc Current orders for Inpatient glycemic control: Lantus 15 units QD x 1, Novolog 0-15 units Q4H  HgbA1C - 8.2%.  Inpatient Diabetes Program Recommendations:     D/C on Levemir 45 units in am and 40 units QHS Novolog 15-30 units tidwc for correction and meal coverage. Uses insulin pens. Pt states he has plenty of insulin pen needles. Will now store the insulin pens he is currently not using in his church refrigerator. Discussed insulin pen can be at room temperature after opening x 28 days. Pt voiced understanding.   Pt admitted to running out of his Levemir PTA and trying to control his blood sugar by using more Novolog. States he found out that this wasn't working and came to ED.   Social work to see pt regarding homelessness.  Will follow.  Thank you. Ailene Ards, RD, LDN, CDE Inpatient Diabetes Coordinator [phone]

## 2018-11-28 NOTE — Care Management Obs Status (Signed)
MEDICARE OBSERVATION STATUS NOTIFICATION   Patient Details  Name: [name] [name] MRN: [id number] Date of Birth: 05-03-1956   Medicare Observation Status Notification Given:  Yes    Golda Acre, RN 11/28/2018, 10:47 AM

## 2018-11-28 NOTE — Telephone Encounter (Signed)
Medication failed protocol due to no recent HA1C / Patient is currently in patient / Last HA1C was completed during last admission on 11-20-18 and was 8.2 / Will send this information to provider to review for refill /

## 2018-11-29 NOTE — Telephone Encounter (Signed)
Please let pt know

## 2018-11-29 NOTE — Telephone Encounter (Signed)
Refill request for Novolog flexpen 100 units 15 ml(5 pens per box, 3 ml) 30 day supply only with no refills.  Pt has 12/05/18 visit with shaw and more refills will be given then if needed per [name]. Dgaddy, CMA

## 2018-11-29 NOTE — Telephone Encounter (Signed)
Patient has already picked up.

## 2018-12-02 ENCOUNTER — Telehealth: Payer: Self-pay | Admitting: Registered Nurse

## 2018-12-02 DIAGNOSIS — G63 Polyneuropathy in diseases classified elsewhere: Secondary | ICD-10-CM

## 2018-12-02 MED ORDER — HYDROCODONE-ACETAMINOPHEN 7.5-325 MG PO TABS: 1.0000 | ORAL_TABLET | Freq: Four times a day (QID) | ORAL | 0 refills | Status: DC | PRN

## 2018-12-02 NOTE — Telephone Encounter (Signed)
Mr/ [name] [name] Hydrocodone prescription e-scribe, according to the PMP last prescription was filled on 11/02/2018. His scheduled appointment is on 12/05/2018 with Dr. Riley Kill. [name] is aware of the above, he verbalizes understanding.

## 2018-12-05 ENCOUNTER — Telehealth (HOSPITAL_COMMUNITY): Payer: Self-pay | Admitting: *Deleted

## 2018-12-05 ENCOUNTER — Encounter: Payer: Self-pay | Admitting: Physical Medicine & Rehabilitation

## 2018-12-05 ENCOUNTER — Encounter: Payer: Medicare HMO | Attending: Physical Medicine & Rehabilitation | Admitting: Physical Medicine & Rehabilitation

## 2018-12-05 VITALS — BP 164/91 | HR 87 | Ht 69.0 in | Wt 248.0 lb

## 2018-12-05 DIAGNOSIS — G894 Chronic pain syndrome: Secondary | ICD-10-CM

## 2018-12-05 DIAGNOSIS — M25551 Pain in right hip: Secondary | ICD-10-CM | POA: Insufficient documentation

## 2018-12-05 DIAGNOSIS — M25552 Pain in left hip: Secondary | ICD-10-CM | POA: Diagnosis not present

## 2018-12-05 DIAGNOSIS — Z79899 Other long term (current) drug therapy: Secondary | ICD-10-CM | POA: Insufficient documentation

## 2018-12-05 DIAGNOSIS — G63 Polyneuropathy in diseases classified elsewhere: Secondary | ICD-10-CM | POA: Diagnosis not present

## 2018-12-05 DIAGNOSIS — G8928 Other chronic postprocedural pain: Secondary | ICD-10-CM | POA: Diagnosis not present

## 2018-12-05 DIAGNOSIS — M799 Soft tissue disorder, unspecified: Secondary | ICD-10-CM | POA: Diagnosis not present

## 2018-12-05 DIAGNOSIS — Z5181 Encounter for therapeutic drug level monitoring: Secondary | ICD-10-CM | POA: Insufficient documentation

## 2018-12-05 DIAGNOSIS — M7989 Other specified soft tissue disorders: Secondary | ICD-10-CM | POA: Diagnosis not present

## 2018-12-05 DIAGNOSIS — M25559 Pain in unspecified hip: Secondary | ICD-10-CM | POA: Diagnosis not present

## 2018-12-05 DIAGNOSIS — E119 Type 2 diabetes mellitus without complications: Secondary | ICD-10-CM | POA: Insufficient documentation

## 2018-12-05 DIAGNOSIS — Z96643 Presence of artificial hip joint, bilateral: Secondary | ICD-10-CM | POA: Diagnosis not present

## 2018-12-05 MED ORDER — HYDROCODONE-ACETAMINOPHEN 7.5-325 MG PO TABS: 1.0000 | ORAL_TABLET | Freq: Four times a day (QID) | ORAL | 0 refills | Status: DC | PRN

## 2018-12-05 NOTE — Progress Notes (Signed)
Subjective:    Patient ID: [name] [name] [name], male    DOB: 29-Jun-1956, 63 y.o.   MRN: [id number]  HPI   [name] [name] is here in follow up regarding his chronic pain. He was admitted to the hospital with ?seizures vs TIA  and later DKA last month. He states that since being home his sugars have been better the last few days. His fasting AM CBG this morning was 200 however!  From a pain standpoint he has been pretty steady. The hydrocodone covers his pain. He uses 7.5/325 qid prn. He tries to stay active at home. He is 5 months from finishing school. His wife is still in [place].     Pain Inventory Average Pain 2 Pain Right Now 2 My pain is constant and aching  In the last 24 hours, has pain interfered with the following? General activity 4 Relation with others 3 Enjoyment of life 5 What TIME of day is your pain at its worst? morning Sleep (in general) Poor  Pain is worse with: inactivity Pain improves with: rest, medication and injections Relief from Meds: 8  Mobility walk without assistance ability to climb steps?  yes do you drive?  yes  Function disabled: date disabled 2011  Neuro/Psych weakness numbness spasms  Prior Studies Any changes since last visit?  no  Physicians involved in your care Any changes since last visit?  no   Family History  Problem Relation Age of Onset  . Hypertension Mother   . Alzheimer's disease Mother   . Alcohol abuse Father   . Cancer Father 24       "Throat"  . Diabetes type II Brother   . Cancer Brother 68       throat cancer  . Heart disease Neg Hx    Social History   Socioeconomic History  . Marital status: Married    Spouse name: Not on file  . Number of children: Not on file  . Years of education: Not on file  . Highest education level: Not on file  Occupational History  . Occupation: unemployed    Comment: applying for disability  Social Needs  . Financial resource strain: Not on file  . Food insecurity:    Worry:  Not on file    Inability: Not on file  . Transportation needs:    Medical: Not on file    Non-medical: Not on file  Tobacco Use  . Smoking status: Former Smoker    Packs/day: 0.25    Years: 3.00    Pack years: 0.75    Last attempt to quit: 11/30/1998    Years since quitting: 20.0  . Smokeless tobacco: Never Used  Substance and Sexual Activity  . Alcohol use: No    Alcohol/week: 1.0 standard drinks    Types: 1 Standard drinks or equivalent per week  . Drug use: No  . Sexual activity: Yes    Birth control/protection: None  Lifestyle  . Physical activity:    Days per week: Not on file    Minutes per session: Not on file  . Stress: Not on file  Relationships  . Social connections:    Talks on phone: Not on file    Gets together: Not on file    Attends religious service: Not on file    Active member of club or organization: Not on file    Attends meetings of clubs or organizations: Not on file    Relationship status: Not on file  Other Topics  Concern  . Not on file  Social History Narrative  . Not on file   Past Surgical History:  Procedure Laterality Date  . LEFT HEART CATHETERIZATION WITH CORONARY ANGIOGRAM N/A 06/14/2014   Procedure: LEFT HEART CATHETERIZATION WITH CORONARY ANGIOGRAM;  Surgeon: Corky CraftsJayadeep S Varanasi, MD;  [facility];  Service: Cardiovascular;  Laterality: N/A;  . TONSILLECTOMY     Past Medical History:  Diagnosis Date  . Barrett's esophagus   . CAD (coronary artery disease)    a. NSTEMI 05/2014 - occluded RCA dominant proximal s/p asp-thrombectomy/DES to RCA, minimal LAD/LCx, EF 50% by cath, 65-70% by echo.  . Depression   . Diabetes type 2, uncontrolled (HCC)   . Former tobacco use   . GERD (gastroesophageal reflux disease)   . Hemorrhoids    hx of  . Hypercholesterolemia   . Hypertension   . MI (myocardial infarction) (HCC)   . Peripheral neuropathy    There were no vitals taken for this visit.  Opioid Risk Score:   Fall Risk Score:   `1  Depression screen PHQ 2/9  Depression screen [facility] 09/12/2018 06/10/2018 04/12/2018 03/11/2018 10/01/2017 09/29/2017 03/08/2017  Decreased Interest 0 0 0 0 0 0 0  Down, Depressed, Hopeless 0 0 0 0 0 0 0  PHQ - 2 Score 0 0 0 0 0 0 0  Altered sleeping - - - - - - -  Tired, decreased energy - - - - - - -  Change in appetite - - - - - - -  Feeling bad or failure about yourself  - - - - - - -  Trouble concentrating - - - - - - -  Moving slowly or fidgety/restless - - - - - - -  Suicidal thoughts - - - - - - -  PHQ-9 Score - - - - - - -  Difficult doing work/chores - - - - - - -     Review of Systems  Constitutional: Negative.   HENT: Negative.   Eyes: Negative.   Respiratory: Negative.   Cardiovascular: Negative.   Gastrointestinal: Negative.   Endocrine: Negative.   Genitourinary: Negative.   Musculoskeletal: Positive for arthralgias, back pain, joint swelling and myalgias.  Skin: Negative.   Allergic/Immunologic: Negative.   Neurological: Positive for weakness and numbness.  Hematological: Negative.   Psychiatric/Behavioral: Negative.   All other systems reviewed and are negative.      Objective:   Physical Exam General: No acute distress HEENT: EOMI, oral membranes moist Cards: reg rate  Chest: normal effort Abdomen: Soft, NT, ND Skin: dry, intact Extremities: no edema   Neuro:Pt is cognitively appropriate with normal insight, memory, and awareness. Cranial nerves 2-12 are intact. Sensory exam appears grosslynormal. Reflexes are 2+ in all 4's. [name] [name] motor coordination is intact. No tremors. Motor function is grossly 5/5.  Musculoskeletal: generalized low back pain. Transfers easily. Marland Kitchen. Psych:Pts behavior normal.       Assessment & Plan:  1. Bilateral hip pain. 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  2. Left medial calf cyst. History of previous inflammation.--resolved 3. Type II DM, poorly controlledf 4.Lumbar  spondylosis with facet arthropathy    Plan: 1. Continuenorco at 5/325 one q6prn #120.  We will continue the controlled substance monitoring program, this consists of regular clinic visits, examinations, routine drug screening, pill counts as well as use of [place] Controlled Substance Reporting System. [place]  -Medication was refilled and a second  prescription was sent to the patient's pharmacy for next month.  Marland Kitchen   2.Reviewed importance of better glucose control 3. [name] regarding right hip replacement--He will potentially pursue hip replacementnext summer (2020) after he finishes school. 4.BP control better 5. May continue low dose gabapentin 100mg  bid to qhs for cramping symptoms--this continues to be effective 6. Seizure prophylaxis with keppra. Needs neurology folow up for sz vs TIA  15 minutesof face to face patient care time were spent during this visit. All questions were encouraged and answered. Will follow up in 2 monthswith NP.           Assessment & Plan:

## 2018-12-05 NOTE — Patient Instructions (Signed)
PLEASE FEEL FREE TO CALL OUR OFFICE WITH ANY PROBLEMS OR QUESTIONS ([phone])      

## 2018-12-05 NOTE — Telephone Encounter (Signed)
Left message on voicemail in reference to upcoming appointment scheduled for 12/07/17 Phone number given for a call back so details instructions can be given.  [name] [name]   

## 2018-12-06 ENCOUNTER — Telehealth (HOSPITAL_COMMUNITY): Payer: Self-pay | Admitting: *Deleted

## 2018-12-06 ENCOUNTER — Other Ambulatory Visit: Payer: Self-pay | Admitting: Family Medicine

## 2018-12-06 NOTE — Telephone Encounter (Signed)
Left message on voicemail in reference to upcoming appointment scheduled for 12/07/18. Phone number given for a call back so details instructions can be given. [name] [name] W   

## 2018-12-06 NOTE — Telephone Encounter (Signed)
Requested medication (s) are due for refill today -yes  Requested medication (s) are on the active medication list -yes  Future visit scheduled -yes  Last refill: 09/26/18  Notes to clinic: Rx states discontinued at discharge- sent for provider review for refill.  Requested Prescriptions  Pending Prescriptions Disp Refills   prasugrel (EFFIENT) 10 MG TABS tablet [Pharmacy Med Name: PRASUGREL 10MG  TABLETS] 90 tablet 0    Sig: TAKE 1 TABLET BY MOUTH DAILY     Hematology:  Antiplatelets Failed - 12/06/2018  3:11 AM      Failed - HGB in normal range and within 180 days    Hemoglobin  Date Value Ref Range Status  11/27/2018 12.8 (L) 13.0 - 17.0 g/dL Final  67/89/3810 17.5 13.0 - 17.7 g/dL Final         Passed - HCT in normal range and within 180 days    HCT  Date Value Ref Range Status  11/27/2018 39.9 39.0 - 52.0 % Final   Hematocrit  Date Value Ref Range Status  06/25/2017 41.1 37.5 - 51.0 % Final         Passed - PLT in normal range and within 180 days    Platelets  Date Value Ref Range Status  11/27/2018 342 150 - 400 K/uL Final  06/25/2017 323 150 - 379 x10E3/uL Final   Platelet Count, POC  Date Value Ref Range Status  03/11/2018 293 142 - 424 K/uL Final         Passed - Valid encounter within last 6 months    Recent Outpatient Visits          2 months ago Dental abscess   [facility] at [place], [place] D, PA-C   9 months ago Uncontrolled type 1 diabetes mellitus with hyperglycemia [facility])   [facility] [place], Levell July, MD   1 year ago Uncontrolled type 2 diabetes mellitus with hyperglycemia [facility])   [facility] [place], Levell July, MD   1 year ago Uncontrolled type 2 diabetes mellitus with hyperglycemia, with long-term current use of insulin [facility])   [facility] [place] [facility], Levell July, MD   2 years ago Insulin dependent type 2 diabetes mellitus, uncontrolled (HCC)   [facility] [place] [facility], Levell July, MD      Future Appointments            In 3 days Sherren Mocha, MD Primary [facility] at [place], [facility]         Signed Prescriptions Disp Refills   rosuvastatin (CRESTOR) 40 MG tablet 90 tablet 0    Sig: TAKE 1 TABLET(40 MG) BY MOUTH DAILY     Cardiovascular:  Antilipid - Statins Failed - 12/06/2018  3:11 AM      Failed - Triglycerides in normal range and within 360 days    Triglycerides  Date Value Ref Range Status  11/20/2018 173 (H) <150 mg/dL Final         Passed - Total Cholesterol in normal range and within 360 days    Cholesterol, Total  Date Value Ref Range Status  09/29/2017 125 100 - 199 mg/dL Final   Cholesterol  Date Value Ref Range Status  11/20/2018 144 0 - 200 mg/dL Final         Passed - LDL in normal range and within 360 days    LDL Calculated  Date Value Ref Range Status  09/29/2017 61 0 - 99 mg/dL Final   LDL Cholesterol  Date Value Ref Range Status  11/20/2018 68 0 - 99 mg/dL Final    Comment:           Total Cholesterol/HDL:CHD Risk Coronary Heart Disease Risk Table                     Men   Women  1/2 Average Risk   3.4   3.3  Average Risk       5.0   4.4  2 X Average Risk   9.6   7.1  3 X Average Risk  23.4   11.0        Use the calculated Patient Ratio above and the CHD Risk Table to determine the patient's CHD Risk.        ATP III CLASSIFICATION (LDL):  <100     mg/dL   Optimal  454-098  mg/dL   Near or Above                    Optimal  130-159  mg/dL   Borderline  119-147  mg/dL   High  >829     mg/dL   Very High Performed at [facility], [address] [place], [place] [address]          Passed - HDL in normal range and within 360 days    HDL  Date Value Ref Range Status  11/20/2018 41 >40 mg/dL Final  08/65/7846 40 >96 mg/dL Final         Passed - Patient is not pregnant      Passed - Valid encounter within last 12 months    Recent Outpatient Visits          2 months ago Dental abscess   [facility] at [place], [place] D, PA-C   9 months  ago Uncontrolled type 1 diabetes mellitus with hyperglycemia [facility])   [facility] [place] [facility], Levell July, MD   1 year ago Uncontrolled type 2 diabetes mellitus with hyperglycemia [facility])   [facility] [place] [facility], Levell July, MD   1 year ago Uncontrolled type 2 diabetes mellitus with hyperglycemia, with long-term current use of insulin [facility])   [facility] at [place] [facility], Levell July, MD   2 years ago Insulin dependent type 2 diabetes mellitus, uncontrolled (HCC)   [facility] [place] [facility], Levell July, MD      Future Appointments            In 3 days Sherren Mocha, MD [facility] at [place], [facility]          lisinopril (PRINIVIL,ZESTRIL) 2.5 MG tablet 90 tablet 0    Sig: TAKE 1 TABLET(2.5 MG) BY MOUTH DAILY     Cardiovascular:  ACE Inhibitors Failed - 12/06/2018  3:11 AM      Failed - Last BP in normal range    BP Readings from Last 1 Encounters:  12/05/18 (!) 164/91         Passed - Cr in normal range and within 180 days    Creat  Date Value Ref Range Status  08/25/2016 1.10 0.70 - 1.33 mg/dL Final    Comment:      For patients > or = 61 years of age: The upper reference limit for Creatinine is approximately 13% higher for people identified as African-American.      Creatinine, Ser  Date Value Ref Range Status  11/28/2018 0.95 0.61 - 1.24 mg/dL Final  Passed - K in normal range and within 180 days    Potassium  Date Value Ref Range Status  11/28/2018 4.4 3.5 - 5.1 mmol/L Final         Passed - Patient is not pregnant      Passed - Valid encounter within last 6 months    Recent Outpatient Visits          2 months ago Dental abscess   [facility] at [place], [place] D, PA-C   9 months ago Uncontrolled type 1 diabetes mellitus with hyperglycemia [facility])   [facility] [place] [facility], Levell July, MD   1 year ago Uncontrolled type 2 diabetes mellitus with hyperglycemia [facility])   [facility] [place] [facility], Levell July, MD   1 year ago Uncontrolled type 2 diabetes mellitus with  hyperglycemia, with long-term current use of insulin [facility])   [facility] [place] [facility], Levell July, MD   2 years ago Insulin dependent type 2 diabetes mellitus, uncontrolled (HCC)   [facility] [place] [facility], Levell July, MD      Future Appointments            In 3 days Sherren Mocha, MD Primary Care at [place], [facility]            Requested Prescriptions  Pending Prescriptions Disp Refills   prasugrel (EFFIENT) 10 MG TABS tablet [Pharmacy Med Name: PRASUGREL 10MG  TABLETS] 90 tablet 0    Sig: TAKE 1 TABLET BY MOUTH DAILY     Hematology:  Antiplatelets Failed - 12/06/2018  3:11 AM      Failed - HGB in normal range and within 180 days    Hemoglobin  Date Value Ref Range Status  11/27/2018 12.8 (L) 13.0 - 17.0 g/dL Final  71/04/2693 85.4 13.0 - 17.7 g/dL Final         Passed - HCT in normal range and within 180 days    HCT  Date Value Ref Range Status  11/27/2018 39.9 39.0 - 52.0 % Final   Hematocrit  Date Value Ref Range Status  06/25/2017 41.1 37.5 - 51.0 % Final         Passed - PLT in normal range and within 180 days    Platelets  Date Value Ref Range Status  11/27/2018 342 150 - 400 K/uL Final  06/25/2017 323 150 - 379 x10E3/uL Final   Platelet Count, POC  Date Value Ref Range Status  03/11/2018 293 142 - 424 K/uL Final         Passed - Valid encounter within last 6 months    Recent Outpatient Visits          2 months ago Dental abscess   [facility] at [place], [place] D, PA-C   9 months ago Uncontrolled type 1 diabetes mellitus with hyperglycemia [facility])   [facility] [place] [facility], Levell July, MD   1 year ago Uncontrolled type 2 diabetes mellitus with hyperglycemia [facility])   [facility] at [place], Levell July, MD   1 year ago Uncontrolled type 2 diabetes mellitus with hyperglycemia, with long-term current use of insulin [facility])   [facility] [place], Levell July, MD   2 years ago Insulin dependent type 2 diabetes mellitus, uncontrolled (HCC)   [facility] at  [place] [facility], Levell July, MD      Future Appointments            In 3 days Sherren Mocha, MD  [facility] [place], [facility]         Signed Prescriptions Disp Refills   rosuvastatin (CRESTOR) 40 MG tablet 90 tablet 0    Sig: TAKE 1 TABLET(40 MG) BY MOUTH DAILY     Cardiovascular:  Antilipid - Statins Failed - 12/06/2018  3:11 AM      Failed - Triglycerides in normal range and within 360 days    Triglycerides  Date Value Ref Range Status  11/20/2018 173 (H) <150 mg/dL Final         Passed - Total Cholesterol in normal range and within 360 days    Cholesterol, Total  Date Value Ref Range Status  09/29/2017 125 100 - 199 mg/dL Final   Cholesterol  Date Value Ref Range Status  11/20/2018 144 0 - 200 mg/dL Final         Passed - LDL in normal range and within 360 days    LDL Calculated  Date Value Ref Range Status  09/29/2017 61 0 - 99 mg/dL Final   LDL Cholesterol  Date Value Ref Range Status  11/20/2018 68 0 - 99 mg/dL Final    Comment:           Total Cholesterol/HDL:CHD Risk Coronary Heart Disease Risk Table                     Men   Women  1/2 Average Risk   3.4   3.3  Average Risk       5.0   4.4  2 X Average Risk   9.6   7.1  3 X Average Risk  23.4   11.0        Use the calculated Patient Ratio above and the CHD Risk Table to determine the patient's CHD Risk.        ATP III CLASSIFICATION (LDL):  <100     mg/dL   Optimal  161-096100-129  mg/dL   Near or Above                    Optimal  130-159  mg/dL   Borderline  [id number]  mg/dL   High  >811>190     mg/dL   Very High Performed at [facility], [address] [place], [place] [id number]          Passed - HDL in normal range and within 360 days    HDL  Date Value Ref Range Status  11/20/2018 41 >40 mg/dL Final  29/56/213010/31/2018 40 >86>39 mg/dL Final         Passed - Patient is not pregnant      Passed - Valid encounter within last 12 months    Recent Outpatient Visits          2 months ago Dental abscess    [facility] [place], [place] D, PA-C   9 months ago Uncontrolled type 1 diabetes mellitus with hyperglycemia [facility])   [facility] [place], Levell JulyEva N, MD   1 year ago Uncontrolled type 2 diabetes mellitus with hyperglycemia [facility])   [facility] [place] [facility], Levell JulyEva N, MD   1 year ago Uncontrolled type 2 diabetes mellitus with hyperglycemia, with long-term current use of insulin [facility](HCC)   [facility] [place], Levell JulyEva N, MD   2 years ago Insulin dependent type 2 diabetes mellitus, uncontrolled (HCC)   [facility] [place], Levell JulyEva N, MD      Future  Appointments            In 3 days Sherren Mocha, MD [facility] at [place], [facility]          lisinopril (PRINIVIL,ZESTRIL) 2.5 MG tablet 90 tablet 0    Sig: TAKE 1 TABLET(2.5 MG) BY MOUTH DAILY     Cardiovascular:  ACE Inhibitors Failed - 12/06/2018  3:11 AM      Failed - Last BP in normal range    BP Readings from Last 1 Encounters:  12/05/18 (!) 164/91         Passed - Cr in normal range and within 180 days    Creat  Date Value Ref Range Status  08/25/2016 1.10 0.70 - 1.33 mg/dL Final    Comment:      For patients > or = 61 years of age: The upper reference limit for Creatinine is approximately 13% higher for people identified as African-American.      Creatinine, Ser  Date Value Ref Range Status  11/28/2018 0.95 0.61 - 1.24 mg/dL Final         Passed - K in normal range and within 180 days    Potassium  Date Value Ref Range Status  11/28/2018 4.4 3.5 - 5.1 mmol/L Final         Passed - Patient is not pregnant      Passed - Valid encounter within last 6 months    Recent Outpatient Visits          2 months ago Dental abscess   Primary [facility] at [place], [place] D, PA-C   9 months ago Uncontrolled type 1 diabetes mellitus with hyperglycemia [facility])   [facility] [place] [facility], Levell July, MD   1 year ago Uncontrolled type 2 diabetes mellitus with hyperglycemia [facility])   [facility] at [place] [facility], Levell July, MD   1 year ago Uncontrolled type 2 diabetes mellitus with hyperglycemia, with long-term current use of insulin [facility])   [facility] [place] [facility], Levell July, MD   2 years ago Insulin dependent type 2 diabetes mellitus, uncontrolled (HCC)   [facility] [place] [facility], Levell July, MD      Future Appointments            In 3 days Sherren Mocha, MD Primary Care at [place], [facility]

## 2018-12-07 ENCOUNTER — Other Ambulatory Visit: Payer: Self-pay | Admitting: Registered Nurse

## 2018-12-07 ENCOUNTER — Telehealth: Payer: Self-pay | Admitting: Family Medicine

## 2018-12-07 ENCOUNTER — Encounter (HOSPITAL_COMMUNITY): Payer: Medicare HMO

## 2018-12-07 DIAGNOSIS — R0989 Other specified symptoms and signs involving the circulatory and respiratory systems: Secondary | ICD-10-CM

## 2018-12-07 NOTE — Telephone Encounter (Signed)
LVM for pt in regards to the appt scheduled with Dr. [name] on 12/09/18. Due to Dr. [name] being on leave, the pt will need to be rescheduled. When pt calls back, please reschedule at their convenience. Pt can either be scheduled at Dr. Leandro Reasoner first available - (push towards the end of February) or with a different provider's first available. Thank you!

## 2018-12-09 ENCOUNTER — Encounter

## 2018-12-09 ENCOUNTER — Encounter: Payer: Medicare HMO | Admitting: Family Medicine

## 2018-12-19 NOTE — Progress Notes (Deleted)
Cardiology Office Note    Date:  12/19/2018  ID:  [name] [name] 1956-02-14, MRN [id number] PCP:  Shawnee Knapp, MD  Cardiologist:  Larae Grooms, MD   Chief Complaint: f/u hospital stay for dizziness  History of Present Illness:  [name] [name] is a 63 y.o. male with history of CAD (NSTEMI in 2015 - DES to RCA), DM2, HTN, HLD, GERD, Barrett's esophagus, former tobacco, depression, hemorrhoids, chronic shoulder pain, peripheral neuropathy who presents for post-hospital follow-up.  He was admitted 2015 with NSTEMI, with cath showing occluded RCA dominant proximal s/p aspiration thrombectomy/DES to RCA, minimal LAD/LCx. EF was 50% by cath, 65-70% by echo. He was not seen by our group after that time until recently when he presented back with dizziness, vision changes and back pain. Per cardiology note he wasn't feeling well - got up out of a chair and was dizzy. He sat back down and sounds like he dosed off, but he thinks he passed out. He awakened slumped over in the chair and had back pain between his shoulders. He denied any other symptoms, except was quite anxious in the ER. He r/o for MI. OP stress test was recommended. Dr. Debara Pickett did not think this represented syncope. 2D echo showed EF 60-65%, grade 1DD, mild LVH, otherwise OK. Labs otherwise were revealing for profound hyperglycemia >600, alk phos 133, AST/ALT OK, Hgb 12.8, LDL 68, BNP 10; 02/2018 thyroid function normal. Brain imaging was nonfocal. He no-showed for his nuc 12/07/2018.  alk phos  Dizziness CAD Essential HTN Hyperlipidemia   Past Medical History:  Diagnosis Date  . Barrett's esophagus   . CAD (coronary artery disease)    a. NSTEMI 05/2014 - occluded RCA dominant proximal s/p asp-thrombectomy/DES to RCA, minimal LAD/LCx, EF 50% by cath, 65-70% by echo.  . Depression   . Diabetes type 2, uncontrolled ([place])   . Former tobacco use   . GERD (gastroesophageal reflux disease)   . Hemorrhoids    hx of  .  Hypercholesterolemia   . Hypertension   . MI (myocardial infarction) ([place])   . Peripheral neuropathy     Past Surgical History:  Procedure Laterality Date  . LEFT HEART CATHETERIZATION WITH CORONARY ANGIOGRAM N/A 06/14/2014   Procedure: LEFT HEART CATHETERIZATION WITH CORONARY ANGIOGRAM;  Surgeon: Jettie Booze, MD;  Location: [facility] CATH LAB;  Service: Cardiovascular;  Laterality: N/A;  . TONSILLECTOMY      Current Medications: No outpatient medications have been marked as taking for the 12/20/18 encounter (Appointment) with Charlie Pitter, PA-C.   ***   Allergies:   Patient has no known allergies.   Social History   Socioeconomic History  . Marital status: Married    Spouse name: Not on file  . Number of children: Not on file  . Years of education: Not on file  . Highest education level: Not on file  Occupational History  . Occupation: unemployed    Comment: applying for disability  Social Needs  . Financial resource strain: Not on file  . Food insecurity:    Worry: Not on file    Inability: Not on file  . Transportation needs:    Medical: Not on file    Non-medical: Not on file  Tobacco Use  . Smoking status: Former Smoker    Packs/day: 0.25    Years: 3.00    Pack years: 0.75    Last attempt to quit: 11/30/1998    Years since quitting: 20.0  . Smokeless  tobacco: Never Used  Substance and Sexual Activity  . Alcohol use: No    Alcohol/week: 1.0 standard drinks    Types: 1 Standard drinks or equivalent per week  . Drug use: No  . Sexual activity: Yes    Birth control/protection: None  Lifestyle  . Physical activity:    Days per week: Not on file    Minutes per session: Not on file  . Stress: Not on file  Relationships  . Social connections:    Talks on phone: Not on file    Gets together: Not on file    Attends religious service: Not on file    Active member of club or organization: Not on file    Attends meetings of clubs or organizations: Not on file     Relationship status: Not on file  Other Topics Concern  . Not on file  Social History Narrative  . Not on file     Family History:  The patient's ***family history includes Alcohol abuse in his father; Alzheimer's disease in his mother; Cancer (age of onset: 107) in his father; Cancer (age of onset: 8) in his brother; Diabetes type II in his brother; Hypertension in his mother. There is no history of Heart disease.  ROS:   Please see the history of present illness. Otherwise, review of systems is positive for ***.  All other systems are reviewed and otherwise negative.    PHYSICAL EXAM:   VS:  There were no vitals taken for this visit.  BMI: There is no height or weight on file to calculate BMI. GEN: Well nourished, well developed, in no acute distress HEENT: normocephalic, atraumatic Neck: no JVD, carotid bruits, or masses Cardiac: ***RRR; no murmurs, rubs, or gallops, no edema  Respiratory:  clear to auscultation bilaterally, normal work of breathing GI: soft, nontender, nondistended, + BS MS: no deformity or atrophy Skin: warm and dry, no rash Neuro:  Alert and Oriented x 3, Strength and sensation are intact, follows commands Psych: euthymic mood, full affect  Wt Readings from Last 3 Encounters:  12/05/18 248 lb (112.5 kg)  11/27/18 234 lb 5.6 oz (106.3 kg)  11/19/18 236 lb 1.8 oz (107.1 kg)      Studies/Labs Reviewed:   EKG:  EKG was ordered today and personally reviewed by me and demonstrates *** EKG was not ordered today.***  Recent Labs: 03/11/2018: TSH 1.280 11/19/2018: B Natriuretic Peptide 10.2 11/27/2018: ALT 21; Hemoglobin 12.8; Platelets 342 11/28/2018: BUN 13; Creatinine, Ser 0.95; Potassium 4.4; Sodium 136   Lipid Panel    Component Value Date/Time   CHOL 144 11/20/2018 0455   CHOL 125 09/29/2017 1044   TRIG 173 (H) 11/20/2018 0455   HDL 41 11/20/2018 0455   HDL 40 09/29/2017 1044   CHOLHDL 3.5 11/20/2018 0455   VLDL 35 11/20/2018 0455   LDLCALC  68 11/20/2018 0455   LDLCALC 61 09/29/2017 1044    Additional studies/ records that were reviewed today include: Summarized above.***    ASSESSMENT & PLAN:   1. ***  Disposition: F/u with ***   Medication Adjustments/Labs and Tests Ordered: Current medicines are reviewed at length with the patient today.  Concerns regarding medicines are outlined above. Medication changes, Labs and Tests ordered today are summarized above and listed in the Patient Instructions accessible in Encounters.   Signed, Charlie Pitter, PA-C  12/19/2018 12:14 PM    [facility] [place], [place], [place]  [address] Phone: [phone];  Fax: [phone]  

## 2018-12-20 ENCOUNTER — Ambulatory Visit: Payer: Medicare HMO | Admitting: Physician Assistant

## 2018-12-20 DIAGNOSIS — R0989 Other specified symptoms and signs involving the circulatory and respiratory systems: Secondary | ICD-10-CM

## 2018-12-21 ENCOUNTER — Encounter: Payer: Self-pay | Admitting: Physician Assistant

## 2018-12-27 ENCOUNTER — Other Ambulatory Visit: Payer: Self-pay | Admitting: Family Medicine

## 2018-12-27 NOTE — Telephone Encounter (Signed)
Requested medication (s) are due for refill today:  yes  Requested medication (s) are on the active medication list:  yes  Future visit scheduled:  no  Last Refill: Pantoprazole 09/26/18; #90; no refills                   Novolog Flexpen 11/29/18; #30 ml.; no refills  Attempted to contact pt. To schedule CPE; has appt. On 12/09/18, but due to Dr. Francene Finders, this appt. Was cancelled.  Left message for pt. To call office to reschedule his appt. (previous message was left re: this on 12/07/18)   Requested Prescriptions  Pending Prescriptions Disp Refills   pantoprazole (PROTONIX) 40 MG tablet [Pharmacy Med Name: PANTOPRAZOLE 40MG  TABLETS] 90 tablet 0    Sig: TAKE 1 TABLET BY MOUTH EVERY DAY     Gastroenterology: Proton Pump Inhibitors Passed - 12/27/2018  9:27 AM      Passed - Valid encounter within last 12 months    Recent Outpatient Visits          3 months ago Dental abscess   [facility] [place], [place] D, PA-C   9 months ago Uncontrolled type 1 diabetes mellitus with hyperglycemia [facility])   [facility], [place] July, MD   1 year ago Uncontrolled type 2 diabetes mellitus with hyperglycemia [facility])   [facility], [place] July, MD   1 year ago Uncontrolled type 2 diabetes mellitus with hyperglycemia, with long-term current use of insulin [facility])   [facility], [place] July, MD   2 years ago Insulin dependent type 2 diabetes mellitus, uncontrolled ([facility])   [facility], [place] July, MD            NOVOLOG FLEXPEN 100 UNIT/ML FlexPen [Pharmacy Med Name: NOVOLOG FLEXPEN INJ (ORANGE)] 30 mL 0    Sig: ADMINISTER 30 TO 35 UNITS UNDER THE SKIN THREE TIMES DAILY     Endocrinology:  Diabetes - Insulins Failed - 12/27/2018  9:27 AM      Failed - HBA1C is between 0 and 7.9 and within 180 days    Hgb A1c MFr Bld  Date Value Ref Range Status  11/20/2018 8.2 (H) 4.8 - 5.6 % Final    Comment:    (NOTE) Pre diabetes:          5.7%-6.4% Diabetes:               >6.4% Glycemic control for   <7.0% adults with diabetes          Passed - Valid encounter within last 6 months    Recent Outpatient Visits          3 months ago Dental abscess   [facility] [place], [place] D, PA-C   9 months ago Uncontrolled type 1 diabetes mellitus with hyperglycemia [facility])   [facility], [place] July, MD   1 year ago Uncontrolled type 2 diabetes mellitus with hyperglycemia [facility])   [facility], Levell July, MD   1 year ago Uncontrolled type 2 diabetes mellitus with hyperglycemia, with long-term current use of insulin [facility])   [facility], Levell July, MD   2 years ago Insulin dependent type 2 diabetes mellitus, uncontrolled (HCC)   [facility], Levell July, MD

## 2018-12-29 ENCOUNTER — Other Ambulatory Visit: Payer: Self-pay | Admitting: Family Medicine

## 2019-01-14 ENCOUNTER — Observation Stay (HOSPITAL_BASED_OUTPATIENT_CLINIC_OR_DEPARTMENT_OTHER): Payer: Medicare HMO

## 2019-01-14 ENCOUNTER — Observation Stay (HOSPITAL_COMMUNITY): Payer: Medicare HMO

## 2019-01-14 ENCOUNTER — Encounter (HOSPITAL_COMMUNITY): Payer: Self-pay

## 2019-01-14 ENCOUNTER — Other Ambulatory Visit: Payer: Self-pay

## 2019-01-14 ENCOUNTER — Observation Stay (HOSPITAL_COMMUNITY)
Admission: EM | Admit: 2019-01-14 | Discharge: 2019-01-17 | Disposition: A | Discharge status: Home / Self Care | Payer: Medicare HMO | Attending: Internal Medicine | Admitting: Internal Medicine

## 2019-01-14 ENCOUNTER — Emergency Department (HOSPITAL_COMMUNITY): Payer: Medicare HMO

## 2019-01-14 DIAGNOSIS — R2681 Unsteadiness on feet: Secondary | ICD-10-CM | POA: Insufficient documentation

## 2019-01-14 DIAGNOSIS — I10 Essential (primary) hypertension: Secondary | ICD-10-CM | POA: Insufficient documentation

## 2019-01-14 DIAGNOSIS — R202 Paresthesia of skin: Secondary | ICD-10-CM | POA: Diagnosis not present

## 2019-01-14 DIAGNOSIS — R531 Weakness: Secondary | ICD-10-CM | POA: Insufficient documentation

## 2019-01-14 DIAGNOSIS — R748 Abnormal levels of other serum enzymes: Secondary | ICD-10-CM

## 2019-01-14 DIAGNOSIS — N179 Acute kidney failure, unspecified: Secondary | ICD-10-CM | POA: Insufficient documentation

## 2019-01-14 DIAGNOSIS — E160 Drug-induced hypoglycemia without coma: Secondary | ICD-10-CM

## 2019-01-14 DIAGNOSIS — E11649 Type 2 diabetes mellitus with hypoglycemia without coma: Principal | ICD-10-CM | POA: Insufficient documentation

## 2019-01-14 DIAGNOSIS — E1165 Type 2 diabetes mellitus with hyperglycemia: Secondary | ICD-10-CM | POA: Diagnosis present

## 2019-01-14 DIAGNOSIS — Z820 Family history of epilepsy and other diseases of the nervous system: Secondary | ICD-10-CM | POA: Insufficient documentation

## 2019-01-14 DIAGNOSIS — E669 Obesity, unspecified: Secondary | ICD-10-CM | POA: Insufficient documentation

## 2019-01-14 DIAGNOSIS — Z87891 Personal history of nicotine dependence: Secondary | ICD-10-CM | POA: Diagnosis not present

## 2019-01-14 DIAGNOSIS — F329 Major depressive disorder, single episode, unspecified: Secondary | ICD-10-CM | POA: Diagnosis not present

## 2019-01-14 DIAGNOSIS — Z955 Presence of coronary angioplasty implant and graft: Secondary | ICD-10-CM | POA: Diagnosis not present

## 2019-01-14 DIAGNOSIS — F32A Depression, unspecified: Secondary | ICD-10-CM | POA: Diagnosis present

## 2019-01-14 DIAGNOSIS — Z8249 Family history of ischemic heart disease and other diseases of the circulatory system: Secondary | ICD-10-CM | POA: Diagnosis not present

## 2019-01-14 DIAGNOSIS — E1151 Type 2 diabetes mellitus with diabetic peripheral angiopathy without gangrene: Secondary | ICD-10-CM | POA: Diagnosis not present

## 2019-01-14 DIAGNOSIS — R42 Dizziness and giddiness: Secondary | ICD-10-CM | POA: Diagnosis not present

## 2019-01-14 DIAGNOSIS — Z8669 Personal history of other diseases of the nervous system and sense organs: Secondary | ICD-10-CM | POA: Diagnosis not present

## 2019-01-14 DIAGNOSIS — Z7982 Long term (current) use of aspirin: Secondary | ICD-10-CM | POA: Diagnosis not present

## 2019-01-14 DIAGNOSIS — R0602 Shortness of breath: Secondary | ICD-10-CM | POA: Diagnosis not present

## 2019-01-14 DIAGNOSIS — T383X5A Adverse effect of insulin and oral hypoglycemic [antidiabetic] drugs, initial encounter: Secondary | ICD-10-CM

## 2019-01-14 DIAGNOSIS — I214 Non-ST elevation (NSTEMI) myocardial infarction: Secondary | ICD-10-CM | POA: Diagnosis not present

## 2019-01-14 DIAGNOSIS — G894 Chronic pain syndrome: Secondary | ICD-10-CM | POA: Diagnosis not present

## 2019-01-14 DIAGNOSIS — R2990 Unspecified symptoms and signs involving the nervous system: Secondary | ICD-10-CM | POA: Diagnosis present

## 2019-01-14 DIAGNOSIS — E876 Hypokalemia: Secondary | ICD-10-CM | POA: Insufficient documentation

## 2019-01-14 DIAGNOSIS — R29810 Facial weakness: Secondary | ICD-10-CM | POA: Insufficient documentation

## 2019-01-14 DIAGNOSIS — K219 Gastro-esophageal reflux disease without esophagitis: Secondary | ICD-10-CM | POA: Diagnosis present

## 2019-01-14 DIAGNOSIS — R7989 Other specified abnormal findings of blood chemistry: Secondary | ICD-10-CM | POA: Diagnosis not present

## 2019-01-14 DIAGNOSIS — R945 Abnormal results of liver function studies: Secondary | ICD-10-CM | POA: Diagnosis not present

## 2019-01-14 DIAGNOSIS — I252 Old myocardial infarction: Secondary | ICD-10-CM | POA: Diagnosis not present

## 2019-01-14 DIAGNOSIS — Z794 Long term (current) use of insulin: Secondary | ICD-10-CM | POA: Insufficient documentation

## 2019-01-14 DIAGNOSIS — Z79899 Other long term (current) drug therapy: Secondary | ICD-10-CM | POA: Diagnosis not present

## 2019-01-14 DIAGNOSIS — I609 Nontraumatic subarachnoid hemorrhage, unspecified: Secondary | ICD-10-CM

## 2019-01-14 DIAGNOSIS — R41841 Cognitive communication deficit: Secondary | ICD-10-CM | POA: Insufficient documentation

## 2019-01-14 DIAGNOSIS — I2510 Atherosclerotic heart disease of native coronary artery without angina pectoris: Secondary | ICD-10-CM | POA: Insufficient documentation

## 2019-01-14 DIAGNOSIS — E1142 Type 2 diabetes mellitus with diabetic polyneuropathy: Secondary | ICD-10-CM | POA: Diagnosis not present

## 2019-01-14 DIAGNOSIS — R079 Chest pain, unspecified: Secondary | ICD-10-CM | POA: Diagnosis not present

## 2019-01-14 DIAGNOSIS — I6330 Cerebral infarction due to thrombosis of unspecified cerebral artery: Secondary | ICD-10-CM

## 2019-01-14 DIAGNOSIS — R110 Nausea: Secondary | ICD-10-CM | POA: Diagnosis not present

## 2019-01-14 DIAGNOSIS — Z6831 Body mass index (BMI) 31.0-31.9, adult: Secondary | ICD-10-CM | POA: Insufficient documentation

## 2019-01-14 DIAGNOSIS — G629 Polyneuropathy, unspecified: Secondary | ICD-10-CM

## 2019-01-14 DIAGNOSIS — IMO0002 Reserved for concepts with insufficient information to code with codable children: Secondary | ICD-10-CM | POA: Diagnosis present

## 2019-01-14 DIAGNOSIS — E162 Hypoglycemia, unspecified: Secondary | ICD-10-CM

## 2019-01-14 DIAGNOSIS — E785 Hyperlipidemia, unspecified: Secondary | ICD-10-CM | POA: Insufficient documentation

## 2019-01-14 LAB — URINALYSIS, ROUTINE W REFLEX MICROSCOPIC
Bilirubin Urine: NEGATIVE
Glucose, UA: 50 mg/dL — AB
Hgb urine dipstick: NEGATIVE
Ketones, ur: NEGATIVE mg/dL
Leukocytes,Ua: NEGATIVE
Nitrite: NEGATIVE
Protein, ur: NEGATIVE mg/dL
Specific Gravity, Urine: 1.015 (ref 1.005–1.030)
pH: 5 (ref 5.0–8.0)

## 2019-01-14 LAB — DIFFERENTIAL
Abs Immature Granulocytes: 0.03 10*3/uL (ref 0.00–0.07)
BASOS PCT: 1 %
Basophils Absolute: 0 10*3/uL (ref 0.0–0.1)
Eosinophils Absolute: 0.1 10*3/uL (ref 0.0–0.5)
Eosinophils Relative: 1 %
Immature Granulocytes: 0 %
Lymphocytes Relative: 17 %
Lymphs Abs: 1.4 10*3/uL (ref 0.7–4.0)
MONOS PCT: 8 %
Monocytes Absolute: 0.7 10*3/uL (ref 0.1–1.0)
Neutro Abs: 6 10*3/uL (ref 1.7–7.7)
Neutrophils Relative %: 73 %

## 2019-01-14 LAB — COMPREHENSIVE METABOLIC PANEL
ALT: 48 U/L — ABNORMAL HIGH (ref 0–44)
AST: 77 U/L — ABNORMAL HIGH (ref 15–41)
Albumin: 4 g/dL (ref 3.5–5.0)
Alkaline Phosphatase: 123 U/L (ref 38–126)
Anion gap: 11 (ref 5–15)
BUN: 18 mg/dL (ref 6–20)
CHLORIDE: 105 mmol/L (ref 98–111)
CO2: 22 mmol/L (ref 22–32)
Calcium: 8.7 mg/dL — ABNORMAL LOW (ref 8.9–10.3)
Creatinine, Ser: 1.04 mg/dL (ref 0.61–1.24)
GFR calc Af Amer: >60 mL/min (ref >60)
GFR calc non Af Amer: >60 mL/min (ref >60)
Glucose, Bld: 38 mg/dL — CL (ref 70–99)
Potassium: 3.3 mmol/L — ABNORMAL LOW (ref 3.5–5.1)
Sodium: 138 mmol/L (ref 135–145)
Total Bilirubin: 0.6 mg/dL (ref 0.3–1.2)
Total Protein: 7.1 g/dL (ref 6.5–8.1)

## 2019-01-14 LAB — RAPID URINE DRUG SCREEN, HOSP PERFORMED
Amphetamines: NOT DETECTED
Barbiturates: NOT DETECTED
Benzodiazepines: NOT DETECTED
Cocaine: NOT DETECTED
Opiates: POSITIVE — AB
TETRAHYDROCANNABINOL: POSITIVE — AB

## 2019-01-14 LAB — ECHOCARDIOGRAM COMPLETE
Height: 69 in
Weight: 3360 oz

## 2019-01-14 LAB — CBC
HCT: 40.1 % (ref 39.0–52.0)
HCT: 40.9 % (ref 39.0–52.0)
Hemoglobin: 12.9 g/dL — ABNORMAL LOW (ref 13.0–17.0)
Hemoglobin: 13.1 g/dL (ref 13.0–17.0)
MCH: 28.3 pg (ref 26.0–34.0)
MCH: 27.3 pg (ref 26.0–34.0)
MCHC: 32.2 g/dL (ref 30.0–36.0)
MCHC: 32 g/dL (ref 30.0–36.0)
MCV: 87.9 fL (ref 80.0–100.0)
MCV: 85.2 fL (ref 80.0–100.0)
NRBC: 0 % (ref 0.0–0.2)
Platelets: 302 10*3/uL (ref 150–400)
Platelets: 278 10*3/uL (ref 150–400)
RBC: 4.56 MIL/uL (ref 4.22–5.81)
RBC: 4.8 MIL/uL (ref 4.22–5.81)
RDW: 13.4 % (ref 11.5–15.5)
RDW: 13.4 % (ref 11.5–15.5)
WBC: 8.2 10*3/uL (ref 4.0–10.5)
WBC: 6.1 10*3/uL (ref 4.0–10.5)
nRBC: 0 % (ref 0.0–0.2)

## 2019-01-14 LAB — GLUCOSE, CAPILLARY
Glucose-Capillary: 227 mg/dL — ABNORMAL HIGH (ref 70–99)
Glucose-Capillary: 246 mg/dL — ABNORMAL HIGH (ref 70–99)
Glucose-Capillary: 301 mg/dL — ABNORMAL HIGH (ref 70–99)

## 2019-01-14 LAB — CBG MONITORING, ED
GLUCOSE-CAPILLARY: 147 mg/dL — AB (ref 70–99)
Glucose-Capillary: 217 mg/dL — ABNORMAL HIGH (ref 70–99)
Glucose-Capillary: 98 mg/dL (ref 70–99)
Glucose-Capillary: 61 mg/dL — ABNORMAL LOW (ref 70–99)
Glucose-Capillary: 102 mg/dL — ABNORMAL HIGH (ref 70–99)
Glucose-Capillary: 201 mg/dL — ABNORMAL HIGH (ref 70–99)

## 2019-01-14 LAB — I-STAT TROPONIN, ED: Troponin i, poc: 0.01 ng/mL (ref 0.00–0.08)

## 2019-01-14 LAB — CREATININE, SERUM
CREATININE: 1.02 mg/dL (ref 0.61–1.24)
GFR calc Af Amer: >60 mL/min (ref >60)
GFR calc non Af Amer: >60 mL/min (ref >60)

## 2019-01-14 LAB — LIPASE, BLOOD: Lipase: 28 U/L (ref 11–51)

## 2019-01-14 LAB — APTT: aPTT: 24 seconds (ref 24–36)

## 2019-01-14 LAB — PROTIME-INR
INR: 0.8
Prothrombin Time: 11.1 seconds — ABNORMAL LOW (ref 11.4–15.2)

## 2019-01-14 LAB — ETHANOL: Alcohol, Ethyl (B): <10 mg/dL (ref <10)

## 2019-01-14 MED ORDER — DEXTROSE 50 % IV SOLN
INTRAVENOUS | Status: AC
  Filled 2019-01-14: qty 50

## 2019-01-14 MED ORDER — SODIUM CHLORIDE 0.9 % IV BOLUS
1000.0000 mL | Freq: Once | INTRAVENOUS | Status: AC
  Administered 2019-01-14: 1000 mL via INTRAVENOUS

## 2019-01-14 MED ORDER — DEXTROSE 10 % IV SOLN
INTRAVENOUS | Status: DC
  Administered 2019-01-14: 13:00:00 via INTRAVENOUS
  Filled 2019-01-14 (×2): qty 1000

## 2019-01-14 MED ORDER — ASPIRIN 325 MG PO TABS
325.0000 mg | ORAL_TABLET | Freq: Every day | ORAL | Status: DC
  Administered 2019-01-15 – 2019-01-17 (×3): 325 mg via ORAL
  Filled 2019-01-14 (×3): qty 1

## 2019-01-14 MED ORDER — ENOXAPARIN SODIUM 40 MG/0.4ML ~~LOC~~ SOLN
40.0000 mg | SUBCUTANEOUS | Status: DC
  Administered 2019-01-14 – 2019-01-16 (×3): 40 mg via SUBCUTANEOUS
  Filled 2019-01-14 (×3): qty 0.4

## 2019-01-14 MED ORDER — ROSUVASTATIN CALCIUM 20 MG PO TABS
40.0000 mg | ORAL_TABLET | Freq: Every day | ORAL | Status: DC
  Administered 2019-01-14 – 2019-01-17 (×4): 40 mg via ORAL
  Filled 2019-01-14 (×4): qty 2

## 2019-01-14 MED ORDER — POLYETHYLENE GLYCOL 3350 17 G PO PACK: 17.0000 g | PACK | Freq: Every day | ORAL | Status: DC | PRN

## 2019-01-14 MED ORDER — ACETAMINOPHEN 650 MG RE SUPP: 650.0000 mg | Freq: Four times a day (QID) | RECTAL | Status: DC | PRN

## 2019-01-14 MED ORDER — DEXTROSE 50 % IV SOLN
1.0000 | Freq: Once | INTRAVENOUS | Status: AC
  Administered 2019-01-14: 50 mL via INTRAVENOUS

## 2019-01-14 MED ORDER — DEXTROSE 50 % IV SOLN
1.0000 | Freq: Once | INTRAVENOUS | Status: AC
  Administered 2019-01-14: 50 mL via INTRAVENOUS
  Filled 2019-01-14: qty 50

## 2019-01-14 MED ORDER — ONDANSETRON HCL 4 MG/2ML IJ SOLN
4.0000 mg | Freq: Once | INTRAMUSCULAR | Status: AC
  Administered 2019-01-14: 4 mg via INTRAVENOUS
  Filled 2019-01-14: qty 2

## 2019-01-14 MED ORDER — LEVETIRACETAM 500 MG PO TABS
500.0000 mg | ORAL_TABLET | Freq: Two times a day (BID) | ORAL | Status: DC
  Administered 2019-01-14 – 2019-01-16 (×5): 500 mg via ORAL
  Filled 2019-01-14 (×3): qty 1
  Filled 2019-01-14: qty 2
  Filled 2019-01-14 (×3): qty 1

## 2019-01-14 MED ORDER — LORAZEPAM 2 MG/ML IJ SOLN
1.0000 mg | Freq: Once | INTRAMUSCULAR | Status: AC | PRN
  Administered 2019-01-14: 1 mg via INTRAVENOUS
  Filled 2019-01-14: qty 1

## 2019-01-14 MED ORDER — GADOBUTROL 1 MMOL/ML IV SOLN: 10.0000 mL | Freq: Once | INTRAVENOUS | Status: DC | PRN

## 2019-01-14 MED ORDER — MECLIZINE HCL 25 MG PO TABS
25.0000 mg | ORAL_TABLET | Freq: Once | ORAL | Status: AC
  Administered 2019-01-14: 25 mg via ORAL
  Filled 2019-01-14: qty 1

## 2019-01-14 MED ORDER — STROKE: EARLY STAGES OF RECOVERY BOOK
Freq: Once | Status: DC
  Filled 2019-01-14 (×2): qty 1

## 2019-01-14 MED ORDER — INSULIN ASPART 100 UNIT/ML ~~LOC~~ SOLN
0.0000 [IU] | SUBCUTANEOUS | Status: DC
  Administered 2019-01-14 – 2019-01-15 (×2): 3 [IU] via SUBCUTANEOUS
  Administered 2019-01-15: 2 [IU] via SUBCUTANEOUS
  Administered 2019-01-15 (×2): 7 [IU] via SUBCUTANEOUS

## 2019-01-14 MED ORDER — PANTOPRAZOLE SODIUM 40 MG PO TBEC
40.0000 mg | DELAYED_RELEASE_TABLET | Freq: Every day | ORAL | Status: DC
  Administered 2019-01-14 – 2019-01-17 (×4): 40 mg via ORAL
  Filled 2019-01-14 (×4): qty 1

## 2019-01-14 MED ORDER — OXYCODONE HCL 5 MG PO TABS
5.0000 mg | ORAL_TABLET | ORAL | Status: DC | PRN
  Administered 2019-01-14 – 2019-01-17 (×9): 5 mg via ORAL
  Filled 2019-01-14 (×9): qty 1

## 2019-01-14 MED ORDER — POTASSIUM CHLORIDE CRYS ER 20 MEQ PO TBCR
40.0000 meq | EXTENDED_RELEASE_TABLET | Freq: Once | ORAL | Status: AC
  Administered 2019-01-14: 40 meq via ORAL
  Filled 2019-01-14: qty 2

## 2019-01-14 MED ORDER — ACETAMINOPHEN 325 MG PO TABS
650.0000 mg | ORAL_TABLET | Freq: Four times a day (QID) | ORAL | Status: DC | PRN
  Administered 2019-01-16: 650 mg via ORAL
  Filled 2019-01-14: qty 2

## 2019-01-14 MED ORDER — ASPIRIN 325 MG PO TABS
325.0000 mg | ORAL_TABLET | ORAL | Status: AC
  Administered 2019-01-14: 325 mg via ORAL
  Filled 2019-01-14: qty 1

## 2019-01-14 MED ORDER — SODIUM CHLORIDE 0.9 % IV SOLN
INTRAVENOUS | Status: DC
  Administered 2019-01-14: 10:00:00 via INTRAVENOUS

## 2019-01-14 NOTE — ED Notes (Signed)
ED TO INPATIENT HANDOFF REPORT  Name/Age/Gender [name] [name] 63 y.o. male  Code Status Code Status History    Date Active Date Inactive Code Status Order ID Comments User Context   11/27/2018 1410 11/28/2018 1654 Full Code [id number]  Tyrone Nine, MD ED   11/19/2018 1714 11/22/2018 1908 Full Code [id number]  Merlene Laughter, DO Inpatient   09/29/2017 1855 10/01/2017 1728 Full Code [id number]  Leatha Gilding, MD Inpatient   09/22/2017 1723 09/24/2017 1602 Full Code [id number]  Haydee Salter, MD ED   04/12/2016 1424 04/14/2016 1728 Full Code [id number]  Jeralyn Bennett, MD Inpatient   05/30/2015 1754 05/31/2015 2049 Full Code [id number]  Calvert Cantor, MD Inpatient   06/14/2014 1638 06/18/2014 1414 Full Code [id number]  Corky Crafts, MD Inpatient   06/14/2014 1127 06/14/2014 1638 Full Code [id number]  Dwana Melena, PA-C Inpatient   07/22/2013 0019 07/24/2013 1738 Full Code [id number]  Eduard Clos, MD Inpatient   10/01/2012 0905 10/04/2012 1509 Full Code [id number]  Joylene Draft, RN Inpatient   10/01/2012 0530 10/01/2012 0905 Full Code [id number]  Hilario Quarry, MD ED   04/08/2012 1640 04/09/2012 1604 Full Code [id number]  Raenette Rover, RN Inpatient   02/28/2012 2356 03/01/2012 1745 Full Code [id number]  Forbes Cellar, RN Inpatient   12/24/2011 1907 12/26/2011 1453 Full Code [id number]  Rhetta Mura, MD ED   11/16/2011 1519 11/16/2011 2241 Full Code [id number]  Dione Booze, MD ED      Home/SNF/Other Home  Chief Complaint Dizziness  Level of Care/Admitting Diagnosis ED Disposition    ED Disposition Condition Comment   Admit  Hospital Area: [facility] [[id number]]  Level of Care: Medical Telemetry [104]  I expect the patient will be discharged within 24 hours: No (not a candidate for 5C-Observation unit)  Diagnosis: Stroke-like symptoms [159458]  Admitting Physician: Zigmund Daniel [phone]  Attending Physician: Shaune Spittle, A  [place] [phone]  PT Class (Do Not Modify): Observation [104]  PT Acc Code (Do Not Modify): Observation [10022]       Medical History Past Medical History:  Diagnosis Date  . Barrett's esophagus   . CAD (coronary artery disease)    a. NSTEMI 05/2014 - occluded RCA dominant proximal s/p asp-thrombectomy/DES to RCA, minimal LAD/LCx, EF 50% by cath, 65-70% by echo.  . Depression   . Diabetes type 2, uncontrolled ([facility])   . Former tobacco use   . GERD (gastroesophageal reflux disease)   . Hemorrhoids    hx of  . Hypercholesterolemia   . Hypertension   . MI (myocardial infarction) ([facility])   . Peripheral neuropathy     Allergies No Known Allergies  IV Location/Drains/Wounds Patient Lines/Drains/Airways Status   Active Line/Drains/Airways    Name:   Placement date:   Placement time:   Site:   Days:   Peripheral IV 01/14/19 Right;Upper Arm   01/14/19    -    Arm   less than 1          Labs/Imaging Results for orders placed or performed during the hospital encounter of 01/14/19 (from the past 48 hour(s))  Ethanol     Status: None   Collection Time: 01/14/19  8:04 AM  Result Value Ref Range   Alcohol, Ethyl (B) <10 <10 mg/dL    Comment: (NOTE) Lowest detectable limit for serum alcohol is 10 mg/dL. For medical purposes only. Performed at [facility], [address] [place],  [place] [address]   Protime-INR     Status: Abnormal   Collection Time: 01/14/19  8:04 AM  Result Value Ref Range   Prothrombin Time 11.1 (L) 11.4 - 15.2 seconds   INR 0.80     Comment: Performed at [facility], [address] [place], [place] [address]  APTT     Status: None   Collection Time: 01/14/19  8:04 AM  Result Value Ref Range   aPTT 24 24 - 36 seconds    Comment: Performed at [facility], [address] [place], [place] [address]  CBC     Status: Abnormal   Collection Time: 01/14/19  8:04 AM  Result Value Ref Range   WBC 8.2  4.0 - 10.5 K/uL   RBC 4.56 4.22 - 5.81 MIL/uL   Hemoglobin 12.9 (L) 13.0 - 17.0 g/dL   HCT 21.3 08.6 - 57.8 %   MCV 87.9 80.0 - 100.0 fL   MCH 28.3 26.0 - 34.0 pg   MCHC 32.2 30.0 - 36.0 g/dL   RDW 46.9 62.9 - 52.8 %   Platelets 302 150 - 400 K/uL   nRBC 0.0 0.0 - 0.2 %    Comment: Performed at [facility], [address] [place], [place] [address]  Differential     Status: None   Collection Time: 01/14/19  8:04 AM  Result Value Ref Range   Neutrophils Relative % 73 %   Neutro Abs 6.0 1.7 - 7.7 K/uL   Lymphocytes Relative 17 %   Lymphs Abs 1.4 0.7 - 4.0 K/uL   Monocytes Relative 8 %   Monocytes Absolute 0.7 0.1 - 1.0 K/uL   Eosinophils Relative 1 %   Eosinophils Absolute 0.1 0.0 - 0.5 K/uL   Basophils Relative 1 %   Basophils Absolute 0.0 0.0 - 0.1 K/uL   Immature Granulocytes 0 %   Abs Immature Granulocytes 0.03 0.00 - 0.07 K/uL    Comment: Performed at [facility], [address], [place], [place] [address]  Comprehensive metabolic panel     Status: Abnormal   Collection Time: 01/14/19  8:04 AM  Result Value Ref Range   Sodium 138 135 - 145 mmol/L   Potassium 3.3 (L) 3.5 - 5.1 mmol/L   Chloride 105 98 - 111 mmol/L   CO2 22 22 - 32 mmol/L   Glucose, Bld 38 (LL) 70 - 99 mg/dL    Comment: REPEATED TO VERIFY CRITICAL RESULT CALLED TO, READ BACK BY AND VERIFIED WITH: BINGHAM,S. RN AT 7253 01/14/19 MULLINS,T    BUN 18 6 - 20 mg/dL   Creatinine, Ser 6.64 0.61 - 1.24 mg/dL   Calcium 8.7 (L) 8.9 - 10.3 mg/dL   Total Protein 7.1 6.5 - 8.1 g/dL   Albumin 4.0 3.5 - 5.0 g/dL   AST 77 (H) 15 - 41 U/L   ALT 48 (H) 0 - 44 U/L   Alkaline Phosphatase 123 38 - 126 U/L   Total Bilirubin 0.6 0.3 - 1.2 mg/dL   GFR calc non Af Amer >60 >60 mL/min   GFR calc Af Amer >60 >60 mL/min   Anion gap 11 5 - 15    Comment: Performed at [facility], [address], [place], [place] [address]  Lipase, blood     Status: None   Collection  Time: 01/14/19  8:04 AM  Result Value Ref Range   Lipase 28 11 - 51 U/L    Comment:  Performed at [facility], [address], [place], [place] [address]  I-stat troponin, ED     Status: None   Collection Time: 01/14/19  8:08 AM  Result Value Ref Range   Troponin i, poc 0.01 0.00 - 0.08 ng/mL   Comment 3            Comment: Due to the release kinetics of cTnI, a negative result within the first hours of the onset of symptoms does not rule out myocardial infarction with certainty. If myocardial infarction is still suspected, repeat the test at appropriate intervals.   CBG monitoring, ED     Status: Abnormal   Collection Time: 01/14/19  9:28 AM  Result Value Ref Range   Glucose-Capillary 564 (HH) 70 - 99 mg/dL   Comment 1 Notify RN   CBG monitoring, ED     Status: Abnormal   Collection Time: 01/14/19  9:32 AM  Result Value Ref Range   Glucose-Capillary 217 (H) 70 - 99 mg/dL  POC CBG, ED     Status: None   Collection Time: 01/14/19 10:08 AM  Result Value Ref Range   Glucose-Capillary 98 70 - 99 mg/dL  Urine rapid drug screen (hosp performed)     Status: Abnormal   Collection Time: 01/14/19 11:15 AM  Result Value Ref Range   Opiates POSITIVE (A) NONE DETECTED   Cocaine NONE DETECTED NONE DETECTED   Benzodiazepines NONE DETECTED NONE DETECTED   Amphetamines NONE DETECTED NONE DETECTED   Tetrahydrocannabinol POSITIVE (A) NONE DETECTED   Barbiturates NONE DETECTED NONE DETECTED    Comment: (NOTE) DRUG SCREEN FOR MEDICAL PURPOSES ONLY.  IF CONFIRMATION IS NEEDED FOR ANY PURPOSE, NOTIFY LAB WITHIN 5 DAYS. LOWEST DETECTABLE LIMITS FOR URINE DRUG SCREEN Drug Class                     Cutoff (ng/mL) Amphetamine and metabolites    1000 Barbiturate and metabolites    200 Benzodiazepine                 200 Tricyclics and metabolites     300 Opiates and metabolites        300 Cocaine and metabolites        300 THC                            50 Performed at  [facility], [address], [place], [place] [address]   Urinalysis, Routine w reflex microscopic     Status: Abnormal   Collection Time: 01/14/19 11:15 AM  Result Value Ref Range   Color, Urine YELLOW YELLOW   APPearance CLEAR CLEAR   Specific Gravity, Urine 1.015 1.005 - 1.030   pH 5.0 5.0 - 8.0   Glucose, UA 50 (A) NEGATIVE mg/dL   Hgb urine dipstick NEGATIVE NEGATIVE   Bilirubin Urine NEGATIVE NEGATIVE   Ketones, ur NEGATIVE NEGATIVE mg/dL   Protein, ur NEGATIVE NEGATIVE mg/dL   Nitrite NEGATIVE NEGATIVE   Leukocytes,Ua NEGATIVE NEGATIVE    Comment: Performed at [facility], [address] [place], [place] [address]  POC CBG, ED     Status: Abnormal   Collection Time: 01/14/19 11:21 AM  Result Value Ref Range   Glucose-Capillary 61 (L) 70 - 99 mg/dL   Dg Chest 2 View  Result Date: 01/14/2019 CLINICAL DATA:  Nausea with dizziness and shaking since last night. EXAM: CHEST - 2  VIEW COMPARISON:  11/20/2018 FINDINGS: The heart size and mediastinal contours are within normal limits. Both lungs are clear. No pleural effusion or pneumothorax. The visualized skeletal structures are unremarkable. IMPRESSION: No active cardiopulmonary disease. Electronically Signed   By: Amie Portland M.D.   On: 01/14/2019 11:37   Ct Head Wo Contrast  Result Date: 01/14/2019 CLINICAL DATA:  Nausea, dizziness, shaking and vomiting. EXAM: CT HEAD WITHOUT CONTRAST TECHNIQUE: Contiguous axial images were obtained from the base of the skull through the vertex without intravenous contrast. COMPARISON:  11/20/2018 FINDINGS: Brain: No evidence of acute infarction, hemorrhage, hydrocephalus, extra-axial collection or mass lesion/mass effect. Vascular: No hyperdense vessel or unexpected calcification. Skull: Normal. Negative for fracture or focal lesion. Sinuses/Orbits: No acute finding. Other: None. IMPRESSION: Unremarkable head CT.  No acute findings. Electronically Signed    By: Irish Lack M.D.   On: 01/14/2019 11:42   EKG Interpretation  Date/Time:  Saturday January 14 2019 08:18:35 EST Ventricular Rate:  109 PR Interval:    QRS Duration: 78 QT Interval:  333 QTC Calculation: 449 R Axis:   12 Text Interpretation:  Sinus tachycardia Abnormal R-wave progression, early transition No significant change since last tracing Confirmed by Gwyneth Sprout ([id number]) on 01/14/2019 8:44:50 AM   Pending Labs Unresulted Labs (From admission, onward)    Start     Ordered   01/15/19 0500  Hemoglobin A1c  Tomorrow morning,   R     01/14/19 1345   01/15/19 0500  Lipid panel  Tomorrow morning,   R    Comments:  Fasting    01/14/19 1345   01/14/19 1208  Hemoglobin A1c  Add-on,   R     01/14/19 1207   Signed and Held  CBC  (enoxaparin (LOVENOX)    CrCl >/= 30 ml/min)  Once,   R    Comments:  Baseline for enoxaparin therapy IF NOT ALREADY DRAWN.  Notify MD if PLT < 100 K.    Signed and Held   Signed and Held  Creatinine, serum  (enoxaparin (LOVENOX)    CrCl >/= 30 ml/min)  Once,   R    Comments:  Baseline for enoxaparin therapy IF NOT ALREADY DRAWN.    Signed and Held   Signed and Held  Creatinine, serum  (enoxaparin (LOVENOX)    CrCl >/= 30 ml/min)  Weekly,   R    Comments:  while on enoxaparin therapy    Signed and Held          Vitals/Pain Today's Vitals   01/14/19 1400 01/14/19 1430 01/14/19 1500 01/14/19 1530  BP: 139/77 (!) 142/72 140/73 (!) 159/84  Pulse: 88 87 95 93  Resp: 16 (!) 23 17 14   Temp:      TempSrc:      SpO2: 97% 98% 98% 97%  Weight:      Height:      PainSc:        Isolation Precautions No active isolations  Medications Medications  0.9 %  sodium chloride infusion ( Intravenous New Bag/Given 01/14/19 1024)  dextrose 10 % infusion ( Intravenous New Bag/Given 01/14/19 1235)   stroke: mapping our early stages of recovery book (has no administration in time range)  gadobutrol (GADAVIST) 1 MMOL/ML injection 10 mL (has no  administration in time range)  ondansetron (ZOFRAN) injection 4 mg (4 mg Intravenous Given 01/14/19 0823)  meclizine (ANTIVERT) tablet 25 mg (25 mg Oral Given 01/14/19 0823)  dextrose 50 % solution 50 mL (50 mLs  Intravenous Given 01/14/19 0934)  sodium chloride 0.9 % bolus 1,000 mL (0 mLs Intravenous Stopped 01/14/19 1204)  dextrose 50 % solution 50 mL (50 mLs Intravenous Given 01/14/19 1240)  aspirin tablet 325 mg (325 mg Oral Given 01/14/19 1409)  LORazepam (ATIVAN) injection 1 mg (1 mg Intravenous Given 01/14/19 1541)    Mobility walks

## 2019-01-14 NOTE — ED Provider Notes (Signed)
[facility]-EMERGENCY DEPT Provider Note   CSN: [id number] Arrival date & time: 01/14/19  0459     History   Chief Complaint Chief Complaint  Patient presents with  . Nausea  . Dizziness    HPI [name] [name] is a 63 y.o. male.  Patient is a 63 year old male with a history of coronary artery disease, diabetes, hypertension, chronic pain, hyperlipidemia who is presenting today with complaints of dizziness.  Patient states around 5 PM last night he started feeling dizzy which he describes as an off-balance sensation that has not gone away all night long.  He also complains of feeling nauseated but only vomited one time.  After vomiting he states he has had some dull chest discomfort and some shortness of breath.  He has no vision changes, unilateral numbness or weakness or speech problems.  He states he just does not feel right.  He says he has been taking his medications as prescribed but his doctor recently decreased the amount of Lasix he was taking.  He denies any new medications.  He has had no diarrhea, cough, congestion.  He has noted some minimal swelling in his legs.  He is not sure how long the swelling in his legs is been going on and did note some mild discomfort in his neck.  The history is provided by the patient.  Dizziness  Quality:  Imbalance (just not feeling right) Severity:  Severe Onset quality:  Sudden Duration: started at 5pm last night. Timing:  Constant Progression:  Unchanged Chronicity:  New (but states he has felt it before but never this bad) Context comment:  Want not doing anything when it started Relieved by:  Nothing Worsened by:  Standing up and turning head (trying to walk) Ineffective treatments:  Being still Associated symptoms: chest pain, nausea, shortness of breath and vomiting   Associated symptoms: no headaches   Associated symptoms comment:  Mild neck pain Risk factors: heart disease and multiple medications   Risk  factors: no new medications   Risk factors comment:  DM   Past Medical History:  Diagnosis Date  . Barrett's esophagus   . CAD (coronary artery disease)    a. NSTEMI 05/2014 - occluded RCA dominant proximal s/p asp-thrombectomy/DES to RCA, minimal LAD/LCx, EF 50% by cath, 65-70% by echo.  . Depression   . Diabetes type 2, uncontrolled (HCC)   . Former tobacco use   . GERD (gastroesophageal reflux disease)   . Hemorrhoids    hx of  . Hypercholesterolemia   . Hypertension   . MI (myocardial infarction) (HCC)   . Peripheral neuropathy     Patient Active Problem List   Diagnosis Date Noted  . Noncompliance with medications   . Homeless   . DKA (diabetic ketoacidosis) (HCC) 11/27/2018  . Chest pain, atypical 11/19/2018  . Dizziness 11/19/2018  . Bilateral low back pain without sciatica 04/12/2018  . Vitamin D deficiency 04/07/2018  . Pill dysphagia 04/07/2018  . Enlarged thyroid gland 04/07/2018  . Labile blood glucose 04/07/2018  . Brittle diabetes mellitus (HCC) 04/07/2018  . AKI (acute kidney injury) (HCC) 09/22/2017  . Chronic pain syndrome 08/18/2016  . Diabetic ketoacidosis (HCC) 04/12/2016  . Hip pain, bilateral 08/23/2015  . Chronic neck pain 08/23/2015  . DKA (diabetic ketoacidoses) (HCC) 05/30/2015  . Acute renal failure (HCC) 05/30/2015  . Restless legs 08/31/2014  . Uncontrolled diabetes mellitus type 2 with peripheral artery disease (HCC) 08/10/2014  . Old myocardial infarction 08/10/2014  .  CAD (coronary artery disease) 06/18/2014  . Hyperlipidemia 06/18/2014  . NSTEMI (non-ST elevated myocardial infarction) (HCC) 06/14/2014  . N&V (nausea and vomiting) 07/21/2013  . Chronic pain in left shoulder 04/06/2013  . Peripheral neuropathy 02/28/2012  . Depression 12/25/2011  . GERD (gastroesophageal reflux disease) 12/25/2011  . DM (diabetes mellitus), type 2, uncontrolled (HCC) 11/18/2011    Past Surgical History:  Procedure Laterality Date  . LEFT HEART  CATHETERIZATION WITH CORONARY ANGIOGRAM N/A 06/14/2014   Procedure: LEFT HEART CATHETERIZATION WITH CORONARY ANGIOGRAM;  Surgeon: Corky Crafts, MD;  Location: [facility] CATH LAB;  Service: Cardiovascular;  Laterality: N/A;  . TONSILLECTOMY          Home Medications    Prior to Admission medications   Medication Sig Start Date End Date Taking? Authorizing Provider  EQ ASPIRIN ADULT LOW DOSE 81 MG EC tablet TAKE ONE TABLET BY MOUTH ONCE DAILY 07/02/15  Yes Corky Crafts, MD  furosemide (LASIX) 20 MG tablet Take 1 tablet (20 mg total) by mouth every other day as needed for fluid or edema. 11/28/18 11/28/19 Yes Albertine Grates, MD  gabapentin (NEURONTIN) 100 MG capsule TAKE 2 CAPSULES BY MOUTH EVERY MORNING AND 2 CAPSULES BY MOUTH  AT BEDTIME Patient taking differently: Take 200 mg by mouth 2 (two) times daily. TAKE 2 CAPSULES BY MOUTH EVERY MORNING AND 2 CAPSULES BY MOUTH  AT BEDTIME 12/07/18  Yes Jones Bales, NP  HYDROcodone-acetaminophen (NORCO) 7.5-325 MG tablet Take 1 tablet by mouth every 6 (six) hours as needed for moderate pain. 12/05/18  Yes Ranelle Oyster, MD  ibuprofen (ADVIL,MOTRIN) 400 MG tablet Take 1 tablet (400 mg total) by mouth every 6 (six) hours as needed. 09/06/18  Yes Lorre Nick, MD  LEVEMIR FLEXTOUCH 100 UNIT/ML Pen INJECT 45 UNITS UNDER THE SKIN EVERY MORNING AND 40 UNITS EVERY NIGHT AT BEDTIME Patient taking differently: Inject 40-45 Units into the skin See admin instructions. INJECT 45 UNITS UNDER THE SKIN EVERY MORNING AND 40 UNITS EVERY NIGHT AT BEDTIME 09/28/18  Yes Sherren Mocha, MD  lisinopril (PRINIVIL,ZESTRIL) 2.5 MG tablet TAKE 1 TABLET(2.5 MG) BY MOUTH DAILY Patient taking differently: Take 2.5 mg by mouth daily.  12/06/18  Yes Sherren Mocha, MD  metoprolol tartrate (LOPRESSOR) 25 MG tablet TAKE 1/2 TABLET(12.5 MG) BY MOUTH TWICE DAILY Patient taking differently: Take 12.5 mg by mouth 2 (two) times daily.  09/12/18  Yes Sherren Mocha, MD  nitroGLYCERIN (NITROSTAT)  0.4 MG SL tablet Place 1 tablet (0.4 mg total) under the tongue every 5 (five) minutes as needed for chest pain (up to 3 doses). 11/10/16  Yes Varanasi, Donnie Coffin, MD  NOVOLOG FLEXPEN 100 UNIT/ML FlexPen ADMINISTER 30 TO 35 UNITS UNDER THE SKIN THREE TIMES DAILY Patient taking differently: Inject 25 Units into the skin 3 (three) times daily with meals.  12/29/18  Yes Sherren Mocha, MD  pantoprazole (PROTONIX) 40 MG tablet TAKE 1 TABLET BY MOUTH EVERY DAY 12/29/18  Yes Sherren Mocha, MD  rosuvastatin (CRESTOR) 40 MG tablet TAKE 1 TABLET(40 MG) BY MOUTH DAILY Patient taking differently: Take 40 mg by mouth daily.  12/29/18  Yes Sherren Mocha, MD  levETIRAcetam (KEPPRA) 500 MG tablet Take 1 tablet (500 mg total) by mouth 2 (two) times daily. Patient not taking: Reported on 01/14/2019 11/22/18   Maretta Bees, MD    Family History Family History  Problem Relation Age of Onset  . Hypertension Mother   . Alzheimer's disease Mother   .  Alcohol abuse Father   . Cancer Father 77       "Throat"  . Diabetes type II Brother   . Cancer Brother 68       throat cancer  . Heart disease Neg Hx     Social History Social History   Tobacco Use  . Smoking status: Former Smoker    Packs/day: 0.25    Years: 3.00    Pack years: 0.75    Last attempt to quit: 11/30/1998    Years since quitting: 20.1  . Smokeless tobacco: Never Used  Substance Use Topics  . Alcohol use: No    Alcohol/week: 1.0 standard drinks    Types: 1 Standard drinks or equivalent per week  . Drug use: No     Allergies   Patient has no known allergies.   Review of Systems Review of Systems  Respiratory: Positive for shortness of breath.   Cardiovascular: Positive for chest pain.  Gastrointestinal: Positive for nausea and vomiting.  Neurological: Positive for dizziness. Negative for headaches.  All other systems reviewed and are negative.    Physical Exam Updated Vital Signs BP (!) 174/84 (BP Location: Left Arm)   Pulse  (!) 112   Temp 98.6 F (37 C) (Oral)   Resp 18   Ht 5\' 9"  (1.753 m)   Wt 95.3 kg   SpO2 99%   BMI 31.01 kg/m   Physical Exam Vitals signs and nursing note reviewed.  Constitutional:      General: He is not in acute distress.    Appearance: He is well-developed.  HENT:     Head: Normocephalic and atraumatic.  Eyes:     General: No visual field deficit.    Conjunctiva/sclera: Conjunctivae normal.     Pupils: Pupils are equal, round, and reactive to light.  Neck:     Musculoskeletal: Normal range of motion and neck supple.  Cardiovascular:     Rate and Rhythm: Regular rhythm. Tachycardia present.     Heart sounds: No murmur.  Pulmonary:     Effort: Pulmonary effort is normal. No respiratory distress.     Breath sounds: Normal breath sounds. No wheezing or rales.  Chest:     Chest wall: No tenderness.  Abdominal:     General: There is no distension.     Palpations: Abdomen is soft.     Tenderness: There is abdominal tenderness in the epigastric area. There is no guarding or rebound.  Musculoskeletal: Normal range of motion.        General: No tenderness.  Skin:    General: Skin is warm and dry.     Findings: No erythema or rash.  Neurological:     Mental Status: He is alert and oriented to person, place, and time.     Cranial Nerves: Cranial nerves are intact.     Sensory: Sensation is intact.     Motor: Motor function is intact. No weakness or pronator drift.     Coordination: Finger-Nose-Finger Test abnormal and Heel to [employer] abnormal.     Comments: Slightly slow to answer questions  Psychiatric:        Behavior: Behavior normal.      ED Treatments / Results  Labs (all labs ordered are listed, but only abnormal results are displayed) Labs Reviewed  PROTIME-INR - Abnormal; Notable for the following components:      Result Value   Prothrombin Time 11.1 (*)    All other components within normal limits  CBC -  Abnormal; Notable for the following components:     Hemoglobin 12.9 (*)    All other components within normal limits  COMPREHENSIVE METABOLIC PANEL - Abnormal; Notable for the following components:   Potassium 3.3 (*)    Glucose, Bld 38 (*)    Calcium 8.7 (*)    AST 77 (*)    ALT 48 (*)    All other components within normal limits  CBG MONITORING, ED - Abnormal; Notable for the following components:   Glucose-Capillary 564 (*)    All other components within normal limits  CBG MONITORING, ED - Abnormal; Notable for the following components:   Glucose-Capillary 217 (*)    All other components within normal limits  ETHANOL  APTT  DIFFERENTIAL  RAPID URINE DRUG SCREEN, HOSP PERFORMED  URINALYSIS, ROUTINE W REFLEX MICROSCOPIC  I-STAT TROPONIN, ED  CBG MONITORING, ED    EKG EKG Interpretation  Date/Time:  Saturday January 14 2019 08:18:35 EST Ventricular Rate:  109 PR Interval:    QRS Duration: 78 QT Interval:  333 QTC Calculation: 449 R Axis:   12 Text Interpretation:  Sinus tachycardia Abnormal R-wave progression, early transition No significant change since last tracing Confirmed by Gwyneth SproutPlunkett, Caelan Atchley ([id number]) on 01/14/2019 8:44:50 AM   Radiology Dg Chest 2 View  Result Date: 01/14/2019 CLINICAL DATA:  Nausea with dizziness and shaking since last night. EXAM: CHEST - 2 VIEW COMPARISON:  11/20/2018 FINDINGS: The heart size and mediastinal contours are within normal limits. Both lungs are clear. No pleural effusion or pneumothorax. The visualized skeletal structures are unremarkable. IMPRESSION: No active cardiopulmonary disease. Electronically Signed   By: Amie Portlandavid  Ormond M.D.   On: 01/14/2019 11:37   Ct Head Wo Contrast  Result Date: 01/14/2019 CLINICAL DATA:  Nausea, dizziness, shaking and vomiting. EXAM: CT HEAD WITHOUT CONTRAST TECHNIQUE: Contiguous axial images were obtained from the base of the skull through the vertex without intravenous contrast. COMPARISON:  11/20/2018 FINDINGS: Brain: No evidence of acute infarction,  hemorrhage, hydrocephalus, extra-axial collection or mass lesion/mass effect. Vascular: No hyperdense vessel or unexpected calcification. Skull: Normal. Negative for fracture or focal lesion. Sinuses/Orbits: No acute finding. Other: None. IMPRESSION: Unremarkable head CT.  No acute findings. Electronically Signed   By: Irish LackGlenn  Yamagata M.D.   On: 01/14/2019 11:42    Procedures Procedures (including critical care time)  Medications Ordered in ED Medications  ondansetron (ZOFRAN) injection 4 mg (has no administration in time range)  meclizine (ANTIVERT) tablet 25 mg (has no administration in time range)     Initial Impression / Assessment and Plan / ED Course  I have reviewed the triage vital signs and the nursing notes.  Pertinent labs & imaging results that were available during my care of the patient were reviewed by me and considered in my medical decision making (see chart for details).     Patient presenting today with dizziness that started at 5 PM yesterday.  He has some mild neck discomfort but no meningeal signs and no infectious symptoms.  Patient does have difficulty with finger-to-nose testing and heel-to-shin testing with some dysmetria.  Patient is describing some mild chest tightness after vomiting and some mild shortness of breath but denies symptoms feeling like when he had a heart attack in the past.  He has some mild epigastric discomfort.  Lower suspicion for MI at this time but he is mildly tachycardic and we will do a screening EKG and troponin.  Patient has some mild lower extremity edema but he has also  changed the way he is taking his Lasix.  Given patient's past medical history of diabetes and DKA concern for possible hyperglycemia causing patient's symptoms versus hypoglycemia versus posterior circulation stroke.  Lower suspicion for ACS, dissection or PE.  Low suspicion for acute abdominal process.  Will check a fingerstick blood sugar, stroke order set initiated and a  CT of the head and CTA of the head and neck pending.  9:36 AM Pt's blood work showed a blood sugar of 38.  When going into the room patient was diaphoretic and received D50.  Repeat blood sugar was 217.  Will hold on scans at this moment.  We will repeat blood sugar in 30 minutes after he has had something to drink.  If he is still symptomatic at that time with corrected blood sugar will proceed with images.   10:19 AM After 30 minutes patient's blood sugar is back to 98.  He last used his Levemir and NovoLog last night around 6 PM.  He injected the NovoLog before he ate at 6 and injected the Levemir after 6:00 in eating.  Feel that this is most likely the cause of his neurologic problems.  He has been eating normally he denies any infectious symptoms.  He has not had any urinary symptoms.  He has not recently changed the dose of his insulin so unclear why he is becoming hypoglycemic.  He is drinking some juice.  We will recheck his sugar if it drops again we will place him on a D5 drip.  Chest x-ray and head CT pending.  Patient given a fluid bolus.  11:53 AM Repeat blood sugar despite drinking orange juice and soda is down to 63 again.  Patient started on a D10 drip.  X-ray and CT without acute findings.  Will admit for persistent hypoglycemia.  CRITICAL CARE Performed by: Genine Beckett Total critical care time: 30 minutes Critical care time was exclusive of separately billable procedures and treating other patients. Critical care was necessary to treat or prevent imminent or life-threatening deterioration. Critical care was time spent personally by me on the following activities: development of treatment plan with patient and/or surrogate as well as nursing, discussions with consultants, evaluation of patient's response to treatment, examination of patient, obtaining history from patient or surrogate, ordering and performing treatments and interventions, ordering and review of laboratory  studies, ordering and review of radiographic studies, pulse oximetry and re-evaluation of patient's condition.  Final Clinical Impressions(s) / ED Diagnoses   Final diagnoses:  Hypoglycemia    ED Discharge Orders    None       Gwyneth Sprout, MD 01/14/19 1154

## 2019-01-14 NOTE — Consult Note (Signed)
   [facility] TeleNeurology Consult Services- STAT Consult  Date of Service: 01/14/2019  CC: right sided weakness and hypoglycemia  Impression/ Recommendations:  Right sided facial droop and weakness: patient is unaware of symptoms so unknown last normal. Recommend admission for further work up. Suspect small vessel right basal ganglia infarct.  -Start aspirin -Start atorvastatin -Allow for permissive htn up to 220/120 -MRI brain -MRA head and neck -TTE -Check Hgb A1c and lipid panel -dysphagia screen -Telemetry -Glucose control per primary team, avoid hypo- and hyperglycemia -DVT prophylaxis -PT/OT/Speech  Consult inpatient neurology Discussed with ED MD Please call with further questions  Ginette Pitman, DO [facility]  History of Present Illness:  62 yo M with history of CAD, htn, hl and dm who presented with dizziness since 17:00 yesterday. Patient states that starting yesterday evening he was having dizziness and disorientation. He thought it was his blood sugar. He checked it and it was 160. He thought he'd feel better after dinner but it didn't. He has a tingling sensation is both legs but that's baseline. His arms and hands are tingling which is new.  The dizziness is like he's going to pass out then spinning. He felt tunnel vision and he felt like he was walking and not getting any where.  Diagnostic Testing: CT head: No acute process  NIHSS: 4 1A: Level of Consciousness - Alert; keenly responsive 0 1B: Ask Month and Age - Both Questions Right 0 1C: 'Blink Eyes' & 'Squeeze Hands' - Performs Both Tasks 0 2: Test Horizontal Extraocular Movements - Normal 0 3: Test Visual Fields - No Visual Loss 0 4: Test Facial Palsy - Minor paralysis (flat nasolabial fold, smile asymetry) +1 5A: Test Left Arm Motor Drift - No Drift for 10 Seconds 0 5B: Test Right Arm Motor Drift - Drift, but doesn't hit bed +1 6A: Test Left Leg Motor Drift - Drift, but doesn't hit bed  +1 6B: Test Right Leg Motor Drift - Drift, but doesn't hit bed +1 7: Test Limb Ataxia - No Ataxia 0 8: Test Sensation - Normal; No sensory loss 0 9: Test Language/Aphasia - Normal; No aphasia 0 10: Test Dysarthria - Normal 0 11: Test Extinction/Inattention - No abnormality 0  Medical Decision Making:  - Extensive number of diagnosis or management options are considered above.   - Extensive amount of complex data reviewed.   - High risk of complication and/or morbidity or mortality are associated with differential diagnostic considerations above.  - There may be Uncertain outcome and increased probability of prolonged functional impairment or high probability of severe prolonged functional impairment associated with some of these differential diagnosis.  Medical Data Reviewed:  1.Data reviewed include clinical labs, radiology,  Medical Tests;   2.Tests results discussed w/performing or interpreting physician;   3.Obtaining/reviewing old medical records;  4.Obtaining case history from another source;  5.Independent review of image, tracing or specimen.   Patient was informed the Neurology Consult would happen via TeleHealth consult by way of interactive audio and video telecommunications and consented to receiving care in this manner.

## 2019-01-14 NOTE — ED Triage Notes (Signed)
Pt reports that he has been nauseous, dizzy, and shaky since about 8p last night. States that he is unsure if it was something that he ate. Reports one episode of vomiting. He denies pain. No focal neuro symptoms noted.

## 2019-01-14 NOTE — ED Notes (Signed)
Report given to [facility] at [facility]. [facility] call for pick up and all the paper work done. [facility] will pick patient up after 7 pm today.

## 2019-01-14 NOTE — Progress Notes (Signed)
  Echocardiogram 2D Echocardiogram has been performed.  [name] [name] L Androw 01/14/2019, 3:30 PM

## 2019-01-14 NOTE — H&P (Addendum)
History and Physical    [name] [name] JXB:[id number] DOB: [birth year] DOA: 01/14/2019  PCP: Sherren MochaShaw, Eva N, MD  Patient coming from: home  I have personally briefly reviewed patient's old medical records in [facility]  Chief Complaint: lightheaded, unsteady gait, tunnel vision  HPI: [name] [name] is [name] [name] 63 y.o. male with medical history significant of coronary artery disease, type 2 diabetes, depression, hyperlipidemia, hypertension presenting with general malaise, tunnel vision, lightheadedness found to have hypoglycemia and right upper extremity and right sided facial weakness.  Patient notes that yesterday evening he started feeling bad.  He and that he just needed to eat something, but his blood sugars were around the 200s.  He describes that he felt "outside of himself".  After he ate dinner he felt nauseous went to the bathroom and threw up one time.  He took his insulin as normal around 6 PM.  After that he progressively got worse.  He describes tunnel vision, feeling lightheaded.  He does not describe vertigo, but instead feels off balance and disoriented.  He describes numbness in his hands and his feet.  He also describes global weakness, he does not note any significant weakness and 1 part of his body more than another.  He denies any slurred speech or facial droop.  He denies any fevers, chills, cough, cold, shortness of breath, urinary symptoms.  He came to the hospital due to persistent symptoms.  ED Course: In the ED there is initially concern for stroke due to his description of dizziness.  His labs showed [name] [name] blood sugar of 38 and his symptoms were thought to be due to hypoglycemia.  Admission was requested for persistent hypoglycemia.  On my exam patient had right-sided facial droop and right upper extremity weakness.  Tele-neurology was consulted and recommended admission for stroke work-up and inpatient neurology consult.  Review of Systems: As per HPI otherwise 10 point review of  systems negative.   Past Medical History:  Diagnosis Date  . Barrett's esophagus   . CAD (coronary artery disease)    Marlaina Coburn. NSTEMI 05/2014 - occluded RCA dominant proximal s/p asp-thrombectomy/DES to RCA, minimal LAD/LCx, EF 50% by cath, 65-70% by echo.  . Depression   . Diabetes type 2, uncontrolled (HCC)   . Former tobacco use   . GERD (gastroesophageal reflux disease)   . Hemorrhoids    hx of  . Hypercholesterolemia   . Hypertension   . MI (myocardial infarction) (HCC)   . Peripheral neuropathy     Past Surgical History:  Procedure Laterality Date  . LEFT HEART CATHETERIZATION WITH CORONARY ANGIOGRAM N/Joelee Snoke 06/14/2014   Procedure: LEFT HEART CATHETERIZATION WITH CORONARY ANGIOGRAM;  Surgeon: Corky CraftsJayadeep S Varanasi, MD;  [facility];  Service: Cardiovascular;  Laterality: N/Jemari Hallum;  . TONSILLECTOMY       reports that he quit smoking about 20 years ago. He has [place]mere [place]way 0.75 pack-year smoking history. He has never used smokeless tobacco. He reports that he does not drink alcohol or use drugs.  No Known Allergies  Family History  Problem Relation Age of Onset  . Hypertension Mother   . Alzheimer's disease Mother   . Alcohol abuse Father   . Cancer Father [id number]       "Throat"  . Diabetes type II Brother   . Cancer Brother 68       throat cancer  . Heart disease Neg Hx     Prior to Admission medications   Medication Sig Start Date End  Date Taking? Authorizing Provider  EQ ASPIRIN ADULT LOW DOSE 81 MG EC tablet TAKE ONE TABLET BY MOUTH ONCE DAILY 07/02/15  Yes Corky Crafts, MD  furosemide (LASIX) 20 MG tablet Take 1 tablet (20 mg total) by mouth every other day as needed for fluid or edema. 11/28/18 11/28/19 Yes Albertine Grates, MD  gabapentin (NEURONTIN) 100 MG capsule TAKE 2 CAPSULES BY MOUTH EVERY MORNING AND 2 CAPSULES BY MOUTH  AT BEDTIME Patient taking differently: Take 200 mg by mouth 2 (two) times daily. TAKE 2 CAPSULES BY MOUTH EVERY MORNING AND 2 CAPSULES BY MOUTH  AT BEDTIME  12/07/18  Yes Jones Bales, NP  HYDROcodone-acetaminophen (NORCO) 7.5-325 MG tablet Take 1 tablet by mouth every 6 (six) hours as needed for moderate pain. 12/05/18  Yes Ranelle Oyster, MD  ibuprofen (ADVIL,MOTRIN) 400 MG tablet Take 1 tablet (400 mg total) by mouth every 6 (six) hours as needed. 09/06/18  Yes Lorre Nick, MD  LEVEMIR FLEXTOUCH 100 UNIT/ML Pen INJECT 45 UNITS UNDER THE SKIN EVERY MORNING AND 40 UNITS EVERY NIGHT AT BEDTIME Patient taking differently: Inject 40-45 Units into the skin See admin instructions. INJECT 45 UNITS UNDER THE SKIN EVERY MORNING AND 40 UNITS EVERY NIGHT AT BEDTIME 09/28/18  Yes Sherren Mocha, MD  lisinopril (PRINIVIL,ZESTRIL) 2.5 MG tablet TAKE 1 TABLET(2.5 MG) BY MOUTH DAILY Patient taking differently: Take 2.5 mg by mouth daily.  12/06/18  Yes Sherren Mocha, MD  metoprolol tartrate (LOPRESSOR) 25 MG tablet TAKE 1/2 TABLET(12.5 MG) BY MOUTH TWICE DAILY Patient taking differently: Take 12.5 mg by mouth 2 (two) times daily.  09/12/18  Yes Sherren Mocha, MD  nitroGLYCERIN (NITROSTAT) 0.4 MG SL tablet Place 1 tablet (0.4 mg total) under the tongue every 5 (five) minutes as needed for chest pain (up to 3 doses). 11/10/16  Yes Varanasi, Donnie Coffin, MD  NOVOLOG FLEXPEN 100 UNIT/ML FlexPen ADMINISTER 30 TO 35 UNITS UNDER THE SKIN THREE TIMES DAILY Patient taking differently: Inject 25 Units into the skin 3 (three) times daily with meals.  12/29/18  Yes Sherren Mocha, MD  pantoprazole (PROTONIX) 40 MG tablet TAKE 1 TABLET BY MOUTH EVERY DAY 12/29/18  Yes Sherren Mocha, MD  rosuvastatin (CRESTOR) 40 MG tablet TAKE 1 TABLET(40 MG) BY MOUTH DAILY Patient taking differently: Take 40 mg by mouth daily.  12/29/18  Yes Sherren Mocha, MD  levETIRAcetam (KEPPRA) 500 MG tablet Take 1 tablet (500 mg total) by mouth 2 (two) times daily. Patient not taking: Reported on 01/14/2019 11/22/18   Maretta Bees, MD    Physical Exam: Vitals:   01/14/19 1400 01/14/19 1430 01/14/19 1500 01/14/19  1530  BP: 139/77 (!) 142/72 140/73 (!) 159/84  Pulse: 88 87 95 93  Resp: 16 (!) 23 17 14   Temp:      TempSrc:      SpO2: 97% 98% 98% 97%  Weight:      Height:        Constitutional: NAD, calm, comfortable Vitals:   01/14/19 1400 01/14/19 1430 01/14/19 1500 01/14/19 1530  BP: 139/77 (!) 142/72 140/73 (!) 159/84  Pulse: 88 87 95 93  Resp: 16 (!) 23 17 14   Temp:      TempSrc:      SpO2: 97% 98% 98% 97%  Weight:      Height:       Eyes: PERRL, lids and conjunctivae normal ENMT: Mucous membranes are moist. Posterior pharynx clear of any exudate or  lesions.Normal dentition.  Neck: normal, supple, no masses, no thyromegaly Respiratory: clear to auscultation bilaterally, no wheezing, no crackles. Normal respiratory effort. No accessory muscle use.  Cardiovascular: Regular rate and rhythm, no murmurs / rubs / gallops. No extremity edema. 2+ pedal pulses.  Abdomen: mild epigastric TTP, no masses palpated. No hepatosplenomegaly. Bowel sounds positive.  Musculoskeletal: no clubbing / cyanosis. No joint deformity upper and lower extremities. Good ROM, no contractures. Normal muscle tone.  Skin: no rashes, lesions, ulcers. No induration Neurologic: Right sided facial droop.  RUE weakness 4/5.  Sensation intact to light touch.  Dysmetria with FNF bilaterally.  Psychiatric: Normal judgment and insight. Alert and oriented x 3. Normal mood.   Labs on Admission: I have personally reviewed following labs and imaging studies  CBC: Recent Labs  Lab 01/14/19 0804  WBC 8.2  NEUTROABS 6.0  HGB 12.9*  HCT 40.1  MCV 87.9  PLT 302   Basic Metabolic Panel: Recent Labs  Lab 01/14/19 0804  NA 138  K 3.3*  CL 105  CO2 22  GLUCOSE 38*  BUN 18  CREATININE 1.04  CALCIUM 8.7*   GFR: Estimated Creatinine Clearance: 86 mL/min (by C-G formula based on SCr of 1.04 mg/dL). Liver Function Tests: Recent Labs  Lab 01/14/19 0804  AST 77*  ALT 48*  ALKPHOS 123  BILITOT 0.6  PROT 7.1  ALBUMIN  4.0   Recent Labs  Lab 01/14/19 0804  LIPASE 28   No results for input(s): AMMONIA in the last 168 hours. Coagulation Profile: Recent Labs  Lab 01/14/19 0804  INR 0.80   Cardiac Enzymes: No results for input(s): CKTOTAL, CKMB, CKMBINDEX, TROPONINI in the last 168 hours. BNP (last 3 results) No results for input(s): PROBNP in the last 8760 hours. HbA1C: No results for input(s): HGBA1C in the last 72 hours. CBG: Recent Labs  Lab 01/14/19 0928 01/14/19 0932 01/14/19 1008 01/14/19 1121  GLUCAP 564* 217* 98 61*   Lipid Profile: No results for input(s): CHOL, HDL, LDLCALC, TRIG, CHOLHDL, LDLDIRECT in the last 72 hours. Thyroid Function Tests: No results for input(s): TSH, T4TOTAL, FREET4, T3FREE, THYROIDAB in the last 72 hours. Anemia Panel: No results for input(s): VITAMINB12, FOLATE, FERRITIN, TIBC, IRON, RETICCTPCT in the last 72 hours. Urine analysis:    Component Value Date/Time   COLORURINE YELLOW 01/14/2019 1115   APPEARANCEUR CLEAR 01/14/2019 1115   LABSPEC 1.015 01/14/2019 1115   PHURINE 5.0 01/14/2019 1115   GLUCOSEU 50 ([place]) 01/14/2019 1115   HGBUR NEGATIVE 01/14/2019 1115   BILIRUBINUR NEGATIVE 01/14/2019 1115   BILIRUBINUR negative 03/11/2018 1613   KETONESUR NEGATIVE 01/14/2019 1115   PROTEINUR NEGATIVE 01/14/2019 1115   UROBILINOGEN 0.2 03/11/2018 1613   UROBILINOGEN 0.2 06/14/2014 0809   NITRITE NEGATIVE 01/14/2019 1115   LEUKOCYTESUR NEGATIVE 01/14/2019 1115    Radiological Exams on Admission: Dg Chest 2 View  Result Date: 01/14/2019 CLINICAL DATA:  Nausea with dizziness and shaking since last night. EXAM: CHEST - 2 VIEW COMPARISON:  11/20/2018 FINDINGS: The heart size and mediastinal contours are within normal limits. Both lungs are clear. No pleural effusion or pneumothorax. The visualized skeletal structures are unremarkable. IMPRESSION: No active cardiopulmonary disease. Electronically Signed   By: Amie Portland M.D.   On: 01/14/2019 11:37   Ct  Head Wo Contrast  Result Date: 01/14/2019 CLINICAL DATA:  Nausea, dizziness, shaking and vomiting. EXAM: CT HEAD WITHOUT CONTRAST TECHNIQUE: Contiguous axial images were obtained from the base of the skull through the vertex without intravenous contrast.  COMPARISON:  11/20/2018 FINDINGS: Brain: No evidence of acute infarction, hemorrhage, hydrocephalus, extra-axial collection or mass lesion/mass effect. Vascular: No hyperdense vessel or unexpected calcification. Skull: Normal. Negative for fracture or focal lesion. Sinuses/Orbits: No acute finding. Other: None. IMPRESSION: Unremarkable head CT.  No acute findings. Electronically Signed   By: Irish Lack M.D.   On: 01/14/2019 11:42    EKG: Independently reviewed. Sinus tachy, appears c/w priors  Assessment/Plan Active Problems:   Stroke-like symptoms  Stroke Like Symptoms: right sided facial droop and RUE weakness.  He's felt LH and off balance as well and had tunnel vision.  General malaise and sensation of LH/off balance started yesterday around 5 PM.  He's unable to say when his R facial droop or RUE weakness started as he did not notice this. Tele neuro in ED Neuro c/s, appreciate recs - [facility], lipids MRI/MRA head/neck Negative head CT TTE Permissive hypertension ASA, continue home crestor PT/OT/SLP Utox with mj and opiates Addendum: of note, 11/22/18 he also had focal neurologic symtpoms concerning for TIA vs seizures  Hypoglycemia  Type 2 DM: severely hypoglycemic to ~30's on BMP.  Improved after D50, then worsened again, now on D10.  Occurred after taking normal dose of his home insulin last night.  Suspect he may be taking less PO in setting of his general malaise? Hold home long acting and mealtime insulin Continue d10 SSI q4 hrs  Abdominal Discomfort  Mildly Elevated LFTs:  follow RUQ [place], consider additional imaging if persistent.  Follow acute hepatitis panel.  Hx Seizures: continue keppra (d/c summary  from 12/24 mentions possible focal seizures and was started on keppra with plan for outpatient follow up).  Hypertension: holding lisinopril and metoprolol and lasix given above  GERD: continue PPI  CAD: continue crestor/asa  DVT prophylaxis: lovenox  Code Status: full  Family Communication: none at bedside  Disposition Plan: pending improvement  Consults called: neurology, tele neurology Admission status: observation   Lacretia Nicks MD [facility] Pager AMION  If 7PM-7AM, please contact night-coverage [email or web address] Password [facility]  01/14/2019, 5:35 PM

## 2019-01-14 NOTE — ED Notes (Signed)
Patient transported to MRI 

## 2019-01-14 NOTE — ED Notes (Addendum)
[facility] at bedside. Pt and belongings transported via [facility]

## 2019-01-15 DIAGNOSIS — G63 Polyneuropathy in diseases classified elsewhere: Secondary | ICD-10-CM | POA: Diagnosis not present

## 2019-01-15 DIAGNOSIS — E1165 Type 2 diabetes mellitus with hyperglycemia: Secondary | ICD-10-CM | POA: Diagnosis not present

## 2019-01-15 DIAGNOSIS — T383X5A Adverse effect of insulin and oral hypoglycemic [antidiabetic] drugs, initial encounter: Secondary | ICD-10-CM

## 2019-01-15 DIAGNOSIS — R2990 Unspecified symptoms and signs involving the nervous system: Secondary | ICD-10-CM

## 2019-01-15 DIAGNOSIS — F329 Major depressive disorder, single episode, unspecified: Secondary | ICD-10-CM

## 2019-01-15 DIAGNOSIS — I609 Nontraumatic subarachnoid hemorrhage, unspecified: Secondary | ICD-10-CM

## 2019-01-15 DIAGNOSIS — I6330 Cerebral infarction due to thrombosis of unspecified cerebral artery: Secondary | ICD-10-CM

## 2019-01-15 DIAGNOSIS — E160 Drug-induced hypoglycemia without coma: Secondary | ICD-10-CM | POA: Diagnosis not present

## 2019-01-15 DIAGNOSIS — R69 Illness, unspecified: Secondary | ICD-10-CM | POA: Diagnosis not present

## 2019-01-15 LAB — BASIC METABOLIC PANEL
Anion gap: 6 (ref 5–15)
BUN: 9 mg/dL (ref 6–20)
CO2: 23 mmol/L (ref 22–32)
CREATININE: 1.32 mg/dL — AB (ref 0.61–1.24)
Calcium: 8.2 mg/dL — ABNORMAL LOW (ref 8.9–10.3)
Chloride: 104 mmol/L (ref 98–111)
GFR calc Af Amer: >60 mL/min (ref >60)
GFR calc non Af Amer: 58 mL/min — ABNORMAL LOW (ref >60)
Glucose, Bld: 412 mg/dL — ABNORMAL HIGH (ref 70–99)
Potassium: 4.6 mmol/L (ref 3.5–5.1)
Sodium: 133 mmol/L — ABNORMAL LOW (ref 135–145)

## 2019-01-15 LAB — GLUCOSE, CAPILLARY
GLUCOSE-CAPILLARY: 314 mg/dL — AB (ref 70–99)
GLUCOSE-CAPILLARY: 212 mg/dL — AB (ref 70–99)
GLUCOSE-CAPILLARY: 178 mg/dL — AB (ref 70–99)
Glucose-Capillary: 410 mg/dL — ABNORMAL HIGH (ref 70–99)
Glucose-Capillary: 352 mg/dL — ABNORMAL HIGH (ref 70–99)
Glucose-Capillary: 267 mg/dL — ABNORMAL HIGH (ref 70–99)

## 2019-01-15 MED ORDER — INSULIN ASPART 100 UNIT/ML ~~LOC~~ SOLN: 10.0000 [IU] | Freq: Three times a day (TID) | SUBCUTANEOUS | Status: DC

## 2019-01-15 MED ORDER — INSULIN ASPART 100 UNIT/ML ~~LOC~~ SOLN
12.0000 [IU] | Freq: Once | SUBCUTANEOUS | Status: AC
  Administered 2019-01-15: 12 [IU] via SUBCUTANEOUS

## 2019-01-15 MED ORDER — INSULIN ASPART 100 UNIT/ML ~~LOC~~ SOLN
0.0000 [IU] | Freq: Three times a day (TID) | SUBCUTANEOUS | Status: DC
  Administered 2019-01-16: 12 [IU] via SUBCUTANEOUS
  Administered 2019-01-16: 7 [IU] via SUBCUTANEOUS

## 2019-01-15 NOTE — Care Management Obs Status (Signed)
MEDICARE OBSERVATION STATUS NOTIFICATION   Patient Details  Name: [name] [name] MRN: [id number] Date of Birth: 01-17-1956   Medicare Observation Status Notification Given:  Yes    Lawerance Sabal, RN 01/15/2019, 12:54 PM

## 2019-01-15 NOTE — Consult Note (Addendum)
STROKE TEAM CONSULT NOTE  REF MD : Carin Hockmar Sheikh  REASON ; R tsided weakness  HPI :  [name] [name] is a 63 year old present African-American gentleman with past medical history of coronary artery disease, hyperlipidemia, hypertension who developed sudden onset of dizziness at 5 PM on 01/14/27. He stated that he also started feeling disoriented and he knew that his sugar was low. He checked it and felt it was 160. However he did not feel better and noticed some tingling in his legs but which is chronic but also noticed some in his hand as well.he also felt like he was about to pass out. EMS noticed that he had some right-sided weakness. Her telemetry neurology consultation was obtained and 9 stroke scale was found to be 4 with motor weakness of the right arm leg and facial droop.upon arrival in the emergency room his blood pressure was found to be low at 36 mg percent and his symptoms are felt to be due to hypoglycemia. After treatment symptoms improved. He was admitted for stroke workup. He feels this morning that his back to his baseline.he had MRI scan of the brain obtained which I personally reviewed shows no evidence of stroke and is normal for his age. MRA of the brain shows no significant large vessel intracranial stenosis. MRA of the neck with and without contrast is limited by motion artifact but does not show any significant large vessel stenosis in the neck.2-D echo done yesterday showed normal ejection fraction of 60-65% without significant wall motion abnormalities.lipid profile and hemoglobin A1c are pending.Echocardiogram showed normal ejection fraction without cardiac source of embolism. Urine drug screen was positive for marijuana which he admits to smoking.he denies any prior history of strokes TIAs seizures. Past medical history coronary artery disease status post stent, depression, diabetes, ex-smoker, gastroesophageal reflux disease, hypertension, hyperlipidemia, myocardial infarction, peripheral  neuropathy. Past surgical history tonsillectomy   Family history mother had Alzheimer's no other neurological problems 14 system review of systems positive for as documented in the above history of present illness no new complaints today. Home medications aspirin 81, Lasix, lisinopril, metoprolol, nitroglycerin, Levemir, hydrocodone, Neurontin, Keppra, Crestor, Protonix OBJECTIVE Vitals:   01/15/19 0800 01/15/19 1200  BP: 127/69 123/64  Pulse: 93   Resp: 16 16  Temp: 98 F (36.7 C) 97.8 F (36.6 C)  SpO2: 96% 97%     Physical Exam Pleasant mildly obese middle-aged African-American male not in distress. . Afebrile. Head is nontraumatic. Neck is supple without bruit.    Cardiac exam no murmur or gallop. Lungs are clear to auscultation. Distal pulses are well felt.  Neurological Exam ;  Awake  Alert oriented x 3. Normal speech and language.eye movements full without nystagmus.fundi were not visualized. Vision acuity and fields appear normal. Hearing is normal. Palatal movements are normal. Face symmetric. Tongue midline. Normal strength, tone, reflexes and coordination. Normal sensation. Gait deferred.  Pertinent Laboratory Studies (past 3 days) / Diagnostics (past 24h) Recent Labs    01/14/19 0804 01/14/19 2146  WBC 8.2 6.1  HGB 12.9* 13.1  PLT 302 278  NA 138  --   K 3.3*  --   CREATININE 1.04 1.02  GLUCOSE 38*  --     Mr Mra Neck Wo Contrast  Result Date: 01/14/2019 CLINICAL DATA:  Dizziness, nausea, and vomiting. Tingling in the arms and legs. Right facial droop. EXAM: MRI HEAD WITHOUT CONTRAST MRA HEAD WITHOUT CONTRAST MRA NECK WITHOUT CONTRAST TECHNIQUE: Multiplanar, multiecho pulse sequences of the brain and surrounding structures  were obtained without intravenous contrast. Angiographic images of the Circle of Willis were obtained using MRA technique without intravenous contrast. Angiographic images of the neck were obtained using MRA technique without intravenous  contrast. Carotid stenosis measurements (when applicable) are obtained utilizing NASCET criteria, using the distal internal carotid diameter as the denominator. COMPARISON:  Head CT 01/14/2019. Head and neck CTA 11/20/2018. Head MRI 11/19/2018. FINDINGS: MRI HEAD FINDINGS Brain: There is no evidence of acute infarct, intracranial hemorrhage, mass, midline shift, or extra-axial fluid collection. Mild cerebral atrophy is not greater than expected for age. The brain is normal in signal. Vascular: Major intracranial vascular flow voids are preserved. Skull and upper cervical spine: Unremarkable bone marrow signal. Sinuses/Orbits: Unremarkable orbits. Mild mucosal thickening in the maxillary sinuses. Clear mastoid air cells. Other: None. MRA HEAD FINDINGS The visualized distal vertebral arteries are patent to the basilar with a slightly ectatic appearance of the proximal left V4 segment relative to the more distal vessel, unchanged. The left AICA and right SCA are patent and prominent in size. Right AICA, left SCA, and PICAs are not well evaluated. The basilar artery is widely patent. There are left larger than right posterior communicating arteries. PCAs are patent without evidence of significant proximal stenosis. The internal carotid arteries are widely patent from skull base to carotid termini. ACAs and MCAs are patent without evidence of proximal branch occlusion or significant proximal stenosis. No aneurysm is identified. MRA NECK FINDINGS Image quality is degraded by motion artifact and noncontrast technique. The aortic arch and proximal arch vessels are not well evaluated. The common carotid arteries are widely patent bilaterally allowing for poor visualization of the proximal vessels including their origins. The cervical internal carotid arteries are patent without evidence of flow limiting stenosis. The appearance of mild narrowing at both carotid bifurcations is likely artifactual given normal appearance on  the prior CTA. The vertebral arteries are patent and codominant with antegrade flow bilaterally, and there is no evidence of significant vertebral artery origin stenosis. IMPRESSION: 1. Unremarkable appearance of the brain for age. No acute intracranial abnormality. 2. Patent anterior and posterior intracranial arterial circulation without proximal branch occlusion or significant stenosis. 3. Negative neck MRA within limitations of motion and noncontrast technique. Electronically Signed   By: Sebastian Ache M.D.   On: 01/14/2019 18:52   Mr Brain Wo Contrast  Result Date: 01/14/2019 CLINICAL DATA:  Dizziness, nausea, and vomiting. Tingling in the arms and legs. Right facial droop. EXAM: MRI HEAD WITHOUT CONTRAST MRA HEAD WITHOUT CONTRAST MRA NECK WITHOUT CONTRAST TECHNIQUE: Multiplanar, multiecho pulse sequences of the brain and surrounding structures were obtained without intravenous contrast. Angiographic images of the Circle of Willis were obtained using MRA technique without intravenous contrast. Angiographic images of the neck were obtained using MRA technique without intravenous contrast. Carotid stenosis measurements (when applicable) are obtained utilizing NASCET criteria, using the distal internal carotid diameter as the denominator. COMPARISON:  Head CT 01/14/2019. Head and neck CTA 11/20/2018. Head MRI 11/19/2018. FINDINGS: MRI HEAD FINDINGS Brain: There is no evidence of acute infarct, intracranial hemorrhage, mass, midline shift, or extra-axial fluid collection. Mild cerebral atrophy is not greater than expected for age. The brain is normal in signal. Vascular: Major intracranial vascular flow voids are preserved. Skull and upper cervical spine: Unremarkable bone marrow signal. Sinuses/Orbits: Unremarkable orbits. Mild mucosal thickening in the maxillary sinuses. Clear mastoid air cells. Other: None. MRA HEAD FINDINGS The visualized distal vertebral arteries are patent to the basilar with a slightly  ectatic appearance  of the proximal left V4 segment relative to the more distal vessel, unchanged. The left AICA and right SCA are patent and prominent in size. Right AICA, left SCA, and PICAs are not well evaluated. The basilar artery is widely patent. There are left larger than right posterior communicating arteries. PCAs are patent without evidence of significant proximal stenosis. The internal carotid arteries are widely patent from skull base to carotid termini. ACAs and MCAs are patent without evidence of proximal branch occlusion or significant proximal stenosis. No aneurysm is identified. MRA NECK FINDINGS Image quality is degraded by motion artifact and noncontrast technique. The aortic arch and proximal arch vessels are not well evaluated. The common carotid arteries are widely patent bilaterally allowing for poor visualization of the proximal vessels including their origins. The cervical internal carotid arteries are patent without evidence of flow limiting stenosis. The appearance of mild narrowing at both carotid bifurcations is likely artifactual given normal appearance on the prior CTA. The vertebral arteries are patent and codominant with antegrade flow bilaterally, and there is no evidence of significant vertebral artery origin stenosis. IMPRESSION: 1. Unremarkable appearance of the brain for age. No acute intracranial abnormality. 2. Patent anterior and posterior intracranial arterial circulation without proximal branch occlusion or significant stenosis. 3. Negative neck MRA within limitations of motion and noncontrast technique. Electronically Signed   By: Sebastian Ache M.D.   On: 01/14/2019 18:52   Mr Maxine Glenn Head Wo Contrast  Result Date: 01/14/2019 CLINICAL DATA:  Dizziness, nausea, and vomiting. Tingling in the arms and legs. Right facial droop. EXAM: MRI HEAD WITHOUT CONTRAST MRA HEAD WITHOUT CONTRAST MRA NECK WITHOUT CONTRAST TECHNIQUE: Multiplanar, multiecho pulse sequences of the brain and  surrounding structures were obtained without intravenous contrast. Angiographic images of the Circle of Willis were obtained using MRA technique without intravenous contrast. Angiographic images of the neck were obtained using MRA technique without intravenous contrast. Carotid stenosis measurements (when applicable) are obtained utilizing NASCET criteria, using the distal internal carotid diameter as the denominator. COMPARISON:  Head CT 01/14/2019. Head and neck CTA 11/20/2018. Head MRI 11/19/2018. FINDINGS: MRI HEAD FINDINGS Brain: There is no evidence of acute infarct, intracranial hemorrhage, mass, midline shift, or extra-axial fluid collection. Mild cerebral atrophy is not greater than expected for age. The brain is normal in signal. Vascular: Major intracranial vascular flow voids are preserved. Skull and upper cervical spine: Unremarkable bone marrow signal. Sinuses/Orbits: Unremarkable orbits. Mild mucosal thickening in the maxillary sinuses. Clear mastoid air cells. Other: None. MRA HEAD FINDINGS The visualized distal vertebral arteries are patent to the basilar with a slightly ectatic appearance of the proximal left V4 segment relative to the more distal vessel, unchanged. The left AICA and right SCA are patent and prominent in size. Right AICA, left SCA, and PICAs are not well evaluated. The basilar artery is widely patent. There are left larger than right posterior communicating arteries. PCAs are patent without evidence of significant proximal stenosis. The internal carotid arteries are widely patent from skull base to carotid termini. ACAs and MCAs are patent without evidence of proximal branch occlusion or significant proximal stenosis. No aneurysm is identified. MRA NECK FINDINGS Image quality is degraded by motion artifact and noncontrast technique. The aortic arch and proximal arch vessels are not well evaluated. The common carotid arteries are widely patent bilaterally allowing for poor  visualization of the proximal vessels including their origins. The cervical internal carotid arteries are patent without evidence of flow limiting stenosis. The appearance of mild narrowing at  both carotid bifurcations is likely artifactual given normal appearance on the prior CTA. The vertebral arteries are patent and codominant with antegrade flow bilaterally, and there is no evidence of significant vertebral artery origin stenosis. IMPRESSION: 1. Unremarkable appearance of the brain for age. No acute intracranial abnormality. 2. Patent anterior and posterior intracranial arterial circulation without proximal branch occlusion or significant stenosis. 3. Negative neck MRA within limitations of motion and noncontrast technique. Electronically Signed   By: Sebastian Ache M.D.   On: 01/14/2019 18:52   US Abdomen Limited Ruq  Result Date: 01/14/2019 CLINICAL DATA:  Elevated liver function tests. EXAM: ULTRASOUND ABDOMEN LIMITED RIGHT UPPER QUADRANT COMPARISON:  None. FINDINGS: Gallbladder: No gallstones or wall thickening visualized. No sonographic Murphy sign noted by sonographer. Common bile duct: Diameter: 6.4 mm Liver: No focal lesion identified. There is diffuse increased echotexture of the liver. Portal vein is patent on color Doppler imaging with normal direction of blood flow towards the liver. IMPRESSION: Diffuse increased echotexture of the liver, nonspecific can be seen in fatty infiltration of liver. No acute abnormality identified. Electronically Signed   By: Sherian Rein M.D.   On: 01/14/2019 19:09     ASSESSMENT [name] is a 63 y.o. male with  Past medical history significant of GERD and Barrett's esophagus, CAD status post stenting, diabetes mellitus type 2 which is uncontrolled insulin-dependent, hypertension, hyperlipidemia, history of MI and peripheral neuropathy secondary to diabetes and former tobacco abuse as well as other comorbidities who presents with a chief complaint of  dizziness and Lightheadedness with right-sided weakness in the setting of low blood sugar of 38 mg percent. Symptoms likely related to hypoglycemia left hemispheric TIA due to small vessel disease is also a possibility. He has multiple vascular risk factors of diabetes, hypertension, hyperlipidemia, obesity  And coronary artery disease         RECOMMENDATIONS : Continue aspirin 81 mg daily for stroke prevention. Check lipid profile and hemoglobin A 1C. Aggressive risk factor modification and strict control of hypertension with blood pressure goal below 130/90, lipids with LDL cholesterol goal below 70 mg percent and diabetes with hemoglobin A1c goal below 6.5%. I also advised him to eat a healthy diet with lots of fruits, vegetables, cereals, whole grains and to be active and lose weight. I counseled him to quit marijuana.No further stroke workup is necessary.greater than 50% time during this 55 minute consultation visit was spent on counseling and coordination of care about his TIA and discussion about stroke prevention and treatment and answering questions. Follow-up as an outpatient in stroke clinic if needed it stroke team will sign off. Kindly call for questions.   Hospital day # 0  Delia Heady, MD [facility] See Amion for Schedule & Pager information 01/15/2019 1:40 PM    To contact Stroke Continuity provider, please refer to [email or web address]. After hours, contact General Neurology

## 2019-01-15 NOTE — Plan of Care (Signed)
  Problem: Education: Goal: Knowledge of patient specific risk factors addressed and post discharge goals established will improve Outcome: Progressing   Problem: Education: Goal: Knowledge of secondary prevention will improve Outcome: Progressing   Problem: Education: Goal: Knowledge of disease or condition will improve Outcome: Progressing   Problem: Education: Goal: Knowledge of patient specific risk factors addressed and post discharge goals established will improve Outcome: Progressing   Problem: Nutrition: Goal: Dietary intake will improve Outcome: Progressing

## 2019-01-15 NOTE — Progress Notes (Signed)
PT Cancellation Note  Patient Details Name: [name] [name] MRN: [id number] DOB: 10-08-1956   Cancelled Treatment:      Obtained pt history and he reported his rt hip pain is 7//10 and requested pain medication. RN notified. He agreed to work with PT after his pain decreases. He reports he is not taking the same medicine or dose in the hospital that he takes at home and it is not controlling his pain as well.   Scherrie November [place], [facility]  [phone] 01/15/2019, 12:53 PM

## 2019-01-15 NOTE — Evaluation (Signed)
Physical Therapy Evaluation and Discharge Patient Details Name: [name] [name] MRN: [id number] DOB: 21-Dec-1955 Today's Date: 01/15/2019   History of Present Illness  63 y.o. male with medical history significant of coronary artery disease, type 2 diabetes, depression, hyperlipidemia, hypertension presenting with general malaise, tunnel vision, lightheadedness found to have hypoglycemia and right upper extremity and right sided facial weakness. Work up for TIA vs hypoglycemia. MRI showing No acute changes   Clinical Impression  Patient evaluated by Physical Therapy with no further acute PT needs identified. Patient with history of rt hip pain and reports it is more severe since admission due to less ambulation/mobility and taking a different medicine than he usually does. All education has been completed and the patient has no further questions. PT is signing off. Thank you for this referral.     Follow Up Recommendations No PT follow up    Equipment Recommendations  None recommended by PT    Recommendations for Other Services       Precautions / Restrictions        Mobility  Bed Mobility Overal bed mobility: Independent Bed Mobility: Sit to Supine       Sit to supine: Independent   General bed mobility comments: Increased time  Transfers Overall transfer level: Needs assistance   Transfers: Sit to/from Stand Sit to Stand: Supervision         General transfer comment: supervision for safety due to h/o dizziness; no issues  Ambulation/Gait Ambulation/Gait assistance: Supervision Gait Distance (Feet): 180 Feet Assistive device: None Gait Pattern/deviations: Step-through pattern;Decreased stride length;Antalgic     General Gait Details: slow velocity; due to pain and groggy from meds  Stairs            Wheelchair Mobility    Modified Rankin (Stroke Patients Only)       Balance Overall balance assessment: Needs assistance Sitting-balance support: No  upper extremity supported;Feet supported Sitting balance-Leahy Scale: Good     Standing balance support: No upper extremity supported;During functional activity Standing balance-Leahy Scale: Fair                               Pertinent Vitals/Pain Pain Assessment: 0-10 Pain Score: 4  Faces Pain Scale: Hurts little more Pain Location: Right hip Pain Descriptors / Indicators: Discomfort Pain Intervention(s): Premedicated before session;Limited activity within patient's tolerance;Repositioned    Home Living Family/patient expects to be discharged to:: Private residence Living Arrangements: Alone Available Help at Discharge: Other (Comment)(Pt reports none. 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      Hand Dominance   Dominant Hand: Right    Extremity/Trunk Assessment   Upper Extremity Assessment Upper Extremity Assessment: Defer to OT evaluation    Lower Extremity Assessment Lower Extremity Assessment: Overall WFL for tasks assessed    Cervical / Trunk Assessment Cervical / Trunk Assessment: Normal  Communication   Communication: No difficulties  Cognition Arousal/Alertness: Lethargic;Suspect due to medications Behavior During Therapy: [facility] for tasks assessed/performed Overall Cognitive Status: No family/caregiver present to determine baseline cognitive functioning  General Comments: No deficits noted despite grogginess      General Comments General comments (skin integrity, edema, etc.): educated in possible use of walking stick vs cane if hip pain  persists/worsens due to time off his feet while hospitalized; pt aware he can purchase for ~$20    Exercises     Assessment/Plan    PT Assessment Patent does not need any further PT services  PT Problem List Decreased balance;Decreased activity tolerance;Pain;Obesity       PT Treatment Interventions      PT Goals (Current goals can be found in the Care Plan section)  Acute Rehab PT Goals Patient Stated Goal: None stated PT Goal Formulation: All assessment and education complete, DC therapy    Frequency     Barriers to discharge        Co-evaluation               AM-PAC PT "6 Clicks" Mobility  Outcome Measure Help needed turning from your back to your side while in a flat bed without using bedrails?: None Help needed moving from lying on your back to sitting on the side of a flat bed without using bedrails?: None Help needed moving to and from a bed to a chair (including a wheelchair)?: None Help needed standing up from a chair using your arms (e.g., wheelchair or bedside chair)?: None Help needed to walk in hospital room?: None Help needed climbing 3-5 steps with a railing? : A Little 6 Click Score: 23    End of Session   Activity Tolerance: Patient tolerated treatment well Patient left: in bed;with call bell/phone within reach;with bed alarm set   PT Visit Diagnosis: Difficulty in walking, not elsewhere classified (R26.2);Pain Pain - Right/Left: Right Pain - part of body: Hip    Time: 5573-2202 PT Time Calculation (min) (ACUTE ONLY): 22 min   Charges:   PT Evaluation $PT Eval Low Complexity: 1 Low            [employer], PT 01/15/2019, 2:30 PM

## 2019-01-15 NOTE — Progress Notes (Signed)
Patient  63 yrs old ,transfer from [facility] for stroke  Like symptoms and unstable blood sugar. Pt drowsy alert and oriented x 4 , MAE . Plan of care discussed with pt. Cardiac monitor in use, SR will continue to monitor.

## 2019-01-15 NOTE — Evaluation (Signed)
Occupational Therapy Evaluation Patient Details Name: [name] [name] MRN: [id number] DOB: Oct 06, 1956 Today's Date: 01/15/2019    History of Present Illness 63 y.o. male with medical history significant of coronary artery disease, type 2 diabetes, depression, hyperlipidemia, hypertension presenting with general malaise, tunnel vision, lightheadedness found to have hypoglycemia and right upper extremity and right sided facial weakness. Work up for TIA vs hypoglycemia. 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 Pt currently performing ADLs and functional mobility at supervision-Min Guard A level due safety with dizziness. Pt reporting increased light headedness and dizziness with functional mobility in hallway. Return to room and pt reports dizziness resolves in sitting. Pt would benefit from further acute OT to facilitate safe dc. Recommend dc to home once medically stable per physician.      Follow Up Recommendations  No OT follow up;Supervision/Assistance - 24 hour    Equipment Recommendations  None recommended by OT    Recommendations for Other Services       Precautions / Restrictions        Mobility Bed Mobility Overal bed mobility: Modified Independent             General bed mobility comments: Increased time  Transfers Overall transfer level: Needs assistance   Transfers: Sit to/from Stand Sit to Stand: Min guard         General transfer comment: Min Guard A for safety    Balance Overall balance assessment: Needs assistance Sitting-balance support: No upper extremity supported;Feet supported Sitting balance-Leahy Scale: Good     Standing balance support: No upper extremity supported;During functional activity Standing balance-Leahy Scale: Fair                             ADL either performed or assessed with clinical judgement   ADL Overall ADL's : Needs  assistance/impaired Eating/Feeding: Independent   Grooming: Supervision/safety;Wash/dry face;Wash/dry hands;Standing   Upper Body Bathing: Supervision/ safety;Sitting   Lower Body Bathing: Min guard;Sit to/from stand   Upper Body Dressing : Supervision/safety;Sitting   Lower Body Dressing: Min guard;Sit to/from stand   Toilet Transfer: Min guard;Ambulation(simulated to reclienr)           Functional mobility during ADLs: Min guard General ADL Comments: Pt performing ADLs at a supervision-Min Guard A level. Requiring increased effort and time for LB ADLs. Pt reporting dizziness with functional mobility in hallway and required to return to room.      Vision Baseline Vision/History: Wears glasses Wears Glasses: Distance only Patient Visual Report: No change from baseline       Perception     Praxis      Pertinent Vitals/Pain Pain Assessment: Faces Faces Pain Scale: Hurts little more Pain Location: Right hip Pain Descriptors / Indicators: Discomfort;Grimacing Pain Intervention(s): Monitored during session;Limited activity within patient's tolerance;Repositioned     Hand Dominance Right   Extremity/Trunk Assessment Upper Extremity Assessment Upper Extremity Assessment: Overall WFL for tasks assessed   Lower Extremity Assessment Lower Extremity Assessment: Defer to PT evaluation   Cervical / Trunk Assessment Cervical / Trunk Assessment: Normal   Communication Communication Communication: No difficulties   Cognition Arousal/Alertness: Awake/alert Behavior During Therapy: WFL for tasks assessed/performed Overall Cognitive Status: No family/caregiver present to determine baseline cognitive functioning  General Comments: Pt appropriate throughout session and problem solving where to locate his phone charger from his belongings bag. Pt requiring increased time but unsure if this is not baseline. Oriented x4   General  Comments       Exercises     Shoulder Instructions      Home Living Family/patient expects to be discharged to:: Private residence Living Arrangements: Alone Available Help at Discharge: Other (Comment)(Pt reports none. 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         OT Problem List: Decreased strength;Decreased range of motion;Decreased activity tolerance;Impaired balance (sitting and/or standing);Decreased knowledge of use of DME or AE;Decreased knowledge of precautions;Pain      OT Treatment/Interventions: Self-care/ADL training;Therapeutic exercise;Therapeutic activities;Visual/perceptual remediation/compensation;Patient/family education;Balance training;Energy conservation    OT Goals(Current goals can be found in the care plan section) Acute Rehab OT Goals Patient Stated Goal: None stated OT Goal Formulation: With patient Time For Goal Achievement: 12/05/18 Potential to Achieve Goals: Good  OT Frequency: Min 2X/week   Barriers to D/C:            Co-evaluation              AM-PAC OT "6 Clicks" Daily Activity     Outcome Measure Help from another person eating meals?: None Help from another person taking care of personal grooming?: None Help from another person toileting, which includes using toliet, bedpan, or urinal?: A Little Help from another person bathing (including washing, rinsing, drying)?: A Little Help from another person to put on and taking off regular upper body clothing?: None Help from another  person to put on and taking off regular lower body clothing?: A Little 6 Click Score: 21   End of Session Nurse Communication: Mobility status  Activity Tolerance: Patient tolerated treatment well;Patient limited by fatigue;Other (comment)(Dizziness) Patient left: in chair;with call bell/phone within reach;with chair alarm set  OT Visit Diagnosis: Unsteadiness on feet (R26.81);Other abnormalities of gait and mobility (R26.89);Muscle weakness (generalized) (M62.81);Other symptoms and signs involving cognitive function;Pain Pain - Right/Left: Right Pain - part of body: Hip                Time: 1111-1130 OT Time Calculation (min): 19 min Charges:  OT General Charges $OT Visit: 1 Visit OT Evaluation $OT Eval Moderate Complexity: 1 Mod  Sora Vrooman MSOT, OTR/L Acute Rehab Pager: [phone] Office: [phone]  Theodoro Grist Cattie Tineo 01/15/2019, 12:45 PM

## 2019-01-15 NOTE — Progress Notes (Signed)
PROGRESS NOTE    NESTA CUDE  IHK:[id number] DOB: Sep 12, 1956 DOA: 01/14/2019 PCP: Sherren Mocha, MD    Brief Narrative:  63 year old male who presented with lightheadedness, unsteady gait and tunnel vision.  He does have significant past medical history for coronary artery disease, type 2 diabetes mellitus, dyslipidemia, hypertension and depression.  Patient reported not feeling well, consistent with generalized weakness, disbalance, associated with hands and feet paresthesias.  He is on insulin therapy.  On his initial physical examination his blood pressure was 139/77, heart rate 88, respirate 16, oxygen saturation 97%, he had moist mucous membranes, his lungs were clear to auscultation bilaterally, heart S1-S2 present and rhythmic, the abdomen had mild tenderness in the epigastric region, no rebound no guarding, no lower extremity edema.  Right-sided facial droop and right upper extremity weakness 4 out of 5.  Positive dysmetria.  Sodium 138, potassium 3.3, chloride 105, bicarb 22, glucose 38, BUN 18, creatinine 1.0, AST 77, ALT 48, white count 8.2, hemoglobin 12.9, hematocrit 40.1, platelets 302.  Urine analysis negative for infection, glucose 50.  His urine tox screen was positive for opiates and tetrahydrocannabinol.  Head CT was negative for acute changes.  Chest x-ray with right base atelectasis.  EKG was sinus tachycardia, normal axis normal intervals.  Patient was admitted to the hospital with a working diagnosis of symptomatic hypoglycemia.   Assessment & Plan:   Principal Problem:   Stroke-like symptoms Active Problems:   DM (diabetes mellitus), type 2, uncontrolled (HCC)   Depression   GERD (gastroesophageal reflux disease)   Peripheral neuropathy   Hypoglycemia due to insulin   Cerebral thrombosis with cerebral infarction   Subarachnoid hemorrhage   1. T2DM  complicated with symptomatic hypoglycemia. This am capillary glucose 301, 314 and 212, will discontinue IV d10 and will  continue capillary glucose monitoring with modified insulins scale to cover glucose 200 and above, will check metabolic panel this am. Patient is tolerating po well. Hold on long acting insulin for now. Focal neuro deficits have improved, likely due to hypoglycemia.   2. HTN. Blood pressure systolic 123 mmHg, will continue to hold on antihypertensive agents, to prevent hypotension.   3. Seizures. Continue with keppra per home regimen, no clinical signs of seizure activity.   4. GERD. Continue proton pump inhibitors.  5. CAD. Chest pain free, will continue medical therapy with asa and statin.   6. Hypokalemia. Follow electrolytes today, patient had Kcl yesterday on admission.   DVT prophylaxis: enoxaparin   Code Status: full Family Communication: no family at the bedside  Disposition Plan/ discharge barriers: pending clinical improvement   Body mass index is 36.63 kg/m. Malnutrition Type:      Malnutrition Characteristics:      Nutrition Interventions:     RN Pressure Injury Documentation:     Consultants:   Neurology   Procedures:     Antimicrobials:       Subjective: Patient this am developed hyperglycemia, on IV D10. Continue to feel very weak and deconditioned. His facial droop and right sided weakness have improved. Had occasional hypoglycemia at home in the past, but most of the time has hyperglycemia. No recent change in his insulin dose, but positive decrease po intake.   Objective: Vitals:   01/14/19 2230 01/15/19 0000 01/15/19 0200 01/15/19 0355  BP: 135/79 120/80 132/82 131/78  Pulse: 99 96 99 95  Resp: 16 18 17 18   Temp: 98.5 F (36.9 C) 99.7 F (37.6 C) 99.2 F (37.3 C) 98.6  F (37 C)  TempSrc: Axillary Oral Oral Oral  SpO2: 100%  97% 95%  Weight:      Height:        Intake/Output Summary (Last 24 hours) at 01/15/2019 0818 Last data filed at 01/15/2019 0600 Gross per 24 hour  Intake 2041.5 ml  Output 1100 ml  Net 941.5 ml   Filed  Weights   01/14/19 0509 01/14/19 2010  Weight: 95.3 kg 112.5 kg    Examination:   General: deconditioned  Neurology: Awake and alert, non focal  E ENT: mild pallor, no icterus, oral mucosa moist Cardiovascular: No JVD. S1-S2 present, rhythmic, no gallops, rubs, or murmurs. No lower extremity edema. Pulmonary: positive breath sounds bilaterally, adequate air movement, no wheezing, rhonchi or rales. Gastrointestinal. Abdomen with no organomegaly, non tender, no rebound or guarding Skin. No rashes Musculoskeletal: no joint deformities     Data Reviewed: I have personally reviewed following labs and imaging studies  CBC: Recent Labs  Lab 01/14/19 0804 01/14/19 2146  WBC 8.2 6.1  NEUTROABS 6.0  --   HGB 12.9* 13.1  HCT 40.1 40.9  MCV 87.9 85.2  PLT 302 278   Basic Metabolic Panel: Recent Labs  Lab 01/14/19 0804 01/14/19 2146  NA 138  --   K 3.3*  --   CL 105  --   CO2 22  --   GLUCOSE 38*  --   BUN 18  --   CREATININE 1.04 1.02  CALCIUM 8.7*  --    GFR: Estimated Creatinine Clearance: 95.2 mL/min (by C-G formula based on SCr of 1.02 mg/dL). Liver Function Tests: Recent Labs  Lab 01/14/19 0804  AST 77*  ALT 48*  ALKPHOS 123  BILITOT 0.6  PROT 7.1  ALBUMIN 4.0   Recent Labs  Lab 01/14/19 0804  LIPASE 28   No results for input(s): AMMONIA in the last 168 hours. Coagulation Profile: Recent Labs  Lab 01/14/19 0804  INR 0.80   Cardiac Enzymes: No results for input(s): CKTOTAL, CKMB, CKMBINDEX, TROPONINI in the last 168 hours. BNP (last 3 results) No results for input(s): PROBNP in the last 8760 hours. HbA1C: No results for input(s): HGBA1C in the last 72 hours. CBG: Recent Labs  Lab 01/14/19 1925 01/14/19 2019 01/14/19 2045 01/14/19 2310 01/15/19 0359  GLUCAP 201* 227* 246* 301* 314*   Lipid Profile: No results for input(s): CHOL, HDL, LDLCALC, TRIG, CHOLHDL, LDLDIRECT in the last 72 hours. Thyroid Function Tests: No results for  input(s): TSH, T4TOTAL, FREET4, T3FREE, THYROIDAB in the last 72 hours. Anemia Panel: No results for input(s): VITAMINB12, FOLATE, FERRITIN, TIBC, IRON, RETICCTPCT in the last 72 hours.    Radiology Studies: I have reviewed all of the imaging during this hospital visit personally     Scheduled Meds: .  stroke: mapping our early stages of recovery book   Does not apply Once  . aspirin  325 mg Oral Daily  . enoxaparin (LOVENOX) injection  40 mg Subcutaneous Q24H  . insulin aspart  0-9 Units Subcutaneous Q4H  . levETIRAcetam  500 mg Oral BID  . pantoprazole  40 mg Oral Daily  . rosuvastatin  40 mg Oral Daily   Continuous Infusions:   LOS: 0 days        Mohammedali Bedoy Annett Gula, MD

## 2019-01-16 DIAGNOSIS — E160 Drug-induced hypoglycemia without coma: Secondary | ICD-10-CM | POA: Diagnosis not present

## 2019-01-16 DIAGNOSIS — E1165 Type 2 diabetes mellitus with hyperglycemia: Secondary | ICD-10-CM

## 2019-01-16 DIAGNOSIS — T383X5A Adverse effect of insulin and oral hypoglycemic [antidiabetic] drugs, initial encounter: Secondary | ICD-10-CM | POA: Diagnosis not present

## 2019-01-16 DIAGNOSIS — F329 Major depressive disorder, single episode, unspecified: Secondary | ICD-10-CM | POA: Diagnosis not present

## 2019-01-16 DIAGNOSIS — R69 Illness, unspecified: Secondary | ICD-10-CM | POA: Diagnosis not present

## 2019-01-16 DIAGNOSIS — G63 Polyneuropathy in diseases classified elsewhere: Secondary | ICD-10-CM | POA: Diagnosis not present

## 2019-01-16 LAB — GLUCOSE, CAPILLARY
GLUCOSE-CAPILLARY: 385 mg/dL — AB (ref 70–99)
GLUCOSE-CAPILLARY: 153 mg/dL — AB (ref 70–99)
Glucose-Capillary: 432 mg/dL — ABNORMAL HIGH (ref 70–99)
Glucose-Capillary: 495 mg/dL — ABNORMAL HIGH (ref 70–99)
Glucose-Capillary: 448 mg/dL — ABNORMAL HIGH (ref 70–99)
Glucose-Capillary: 401 mg/dL — ABNORMAL HIGH (ref 70–99)
Glucose-Capillary: 277 mg/dL — ABNORMAL HIGH (ref 70–99)

## 2019-01-16 LAB — HEMOGLOBIN A1C
Hgb A1c MFr Bld: 10 % — ABNORMAL HIGH (ref 4.8–5.6)
MEAN PLASMA GLUCOSE: 240 mg/dL

## 2019-01-16 LAB — BASIC METABOLIC PANEL
Anion gap: 14 (ref 5–15)
BUN: 14 mg/dL (ref 6–20)
CHLORIDE: 100 mmol/L (ref 98–111)
CO2: 19 mmol/L — ABNORMAL LOW (ref 22–32)
Calcium: 8.5 mg/dL — ABNORMAL LOW (ref 8.9–10.3)
Creatinine, Ser: 1.34 mg/dL — ABNORMAL HIGH (ref 0.61–1.24)
GFR calc Af Amer: >60 mL/min (ref >60)
GFR calc non Af Amer: 57 mL/min — ABNORMAL LOW (ref >60)
Glucose, Bld: 506 mg/dL (ref 70–99)
Potassium: 4.9 mmol/L (ref 3.5–5.1)
Sodium: 133 mmol/L — ABNORMAL LOW (ref 135–145)

## 2019-01-16 MED ORDER — INSULIN DETEMIR 100 UNIT/ML ~~LOC~~ SOLN
20.0000 [IU] | Freq: Two times a day (BID) | SUBCUTANEOUS | Status: DC
  Administered 2019-01-16 – 2019-01-17 (×3): 20 [IU] via SUBCUTANEOUS
  Filled 2019-01-16 (×3): qty 0.2

## 2019-01-16 MED ORDER — INSULIN ASPART 100 UNIT/ML ~~LOC~~ SOLN
5.0000 [IU] | Freq: Once | SUBCUTANEOUS | Status: AC
  Administered 2019-01-16: 5 [IU] via SUBCUTANEOUS

## 2019-01-16 MED ORDER — INSULIN ASPART 100 UNIT/ML ~~LOC~~ SOLN
0.0000 [IU] | Freq: Three times a day (TID) | SUBCUTANEOUS | Status: DC
  Administered 2019-01-16: 15 [IU] via SUBCUTANEOUS
  Administered 2019-01-17: 2 [IU] via SUBCUTANEOUS

## 2019-01-16 NOTE — Progress Notes (Signed)
Mr Holsonback blood sugar continue to be high  Medical  Personnel made aware but stated pt does not need night coverage.     Rechecked CBG at 0400 and was  432  Then recheck it again and CBG 495 at 0630  12 unit novo log insulin SQ  given per sliding scale  Medical personal on call re-ext paged as pt c/o abdominal pain drowsiness.Marland Kitchen additional 5 unit given after CBG rechecked and read 448. Pt  Needs some basal insulin in addition to the sliding scale  Diabetic  Care educator consulted . Pt is more awake vs stable. Care endorsed to oncoming Rn.to recheck CBG in  1 hr.

## 2019-01-16 NOTE — Evaluation (Signed)
Speech Language Pathology Evaluation Patient Details Name: [name] [name] MRN: [id number] DOB: Apr 14, 1956 Today's Date: 01/16/2019 Time: 1145-1200 SLP Time Calculation (min) (ACUTE ONLY): 15 min  Problem List:  Patient Active Problem List   Diagnosis Date Noted  . Cerebral thrombosis with cerebral infarction 01/15/2019  . Subarachnoid hemorrhage 01/15/2019  . Stroke-like symptoms 01/14/2019  . Hypoglycemia due to insulin 01/14/2019  . Noncompliance with medications   . Homeless   . DKA (diabetic ketoacidosis) (HCC) 11/27/2018  . Chest pain, atypical 11/19/2018  . Dizziness 11/19/2018  . Bilateral low back pain without sciatica 04/12/2018  . Vitamin D deficiency 04/07/2018  . Pill dysphagia 04/07/2018  . Enlarged thyroid gland 04/07/2018  . Labile blood glucose 04/07/2018  . Brittle diabetes mellitus (HCC) 04/07/2018  . AKI (acute kidney injury) (HCC) 09/22/2017  . Chronic pain syndrome 08/18/2016  . Diabetic ketoacidosis (HCC) 04/12/2016  . Hip pain, bilateral 08/23/2015  . Chronic neck pain 08/23/2015  . DKA (diabetic ketoacidoses) (HCC) 05/30/2015  . Acute renal failure (HCC) 05/30/2015  . Restless legs 08/31/2014  . Uncontrolled diabetes mellitus type 2 with peripheral artery disease (HCC) 08/10/2014  . Old myocardial infarction 08/10/2014  . CAD (coronary artery disease) 06/18/2014  . Hyperlipidemia 06/18/2014  . NSTEMI (non-ST elevated myocardial infarction) (HCC) 06/14/2014  . N&V (nausea and vomiting) 07/21/2013  . Chronic pain in left shoulder 04/06/2013  . Peripheral neuropathy 02/28/2012  . Depression 12/25/2011  . GERD (gastroesophageal reflux disease) 12/25/2011  . DM (diabetes mellitus), type 2, uncontrolled (HCC) 11/18/2011   Past Medical History:  Past Medical History:  Diagnosis Date  . Barrett's esophagus   . CAD (coronary artery disease)    a. NSTEMI 05/2014 - occluded RCA dominant proximal s/p asp-thrombectomy/DES to RCA, minimal LAD/LCx, EF 50% by  cath, 65-70% by echo.  . Depression   . Diabetes type 2, uncontrolled (HCC)   . Former tobacco use   . GERD (gastroesophageal reflux disease)   . Hemorrhoids    hx of  . Hypercholesterolemia   . Hypertension   . MI (myocardial infarction) (HCC)   . Peripheral neuropathy    Past Surgical History:  Past Surgical History:  Procedure Laterality Date  . LEFT HEART CATHETERIZATION WITH CORONARY ANGIOGRAM N/A 06/14/2014   Procedure: LEFT HEART CATHETERIZATION WITH CORONARY ANGIOGRAM;  Surgeon: Corky Crafts, MD;  Location: [facility] CATH LAB;  Service: Cardiovascular;  Laterality: N/A;  . TONSILLECTOMY     HPI:  63 y.o. male with medical history significant of coronary artery disease, type 2 diabetes, depression, hyperlipidemia, hypertension presenting with general malaise, tunnel vision, lightheadedness found to have hypoglycemia and right upper extremity and right sided facial weakness. Work up for TIA vs hypoglycemia. MRI showing No acute abnormalities. UDS positive for marijuna.    Assessment / Plan / Recommendation Clinical Impression  Pt presents with functional cognitive linguistic abilities. Pt obtained score of 29 out of 30 on MOCA (considered within functonal range n=> 26). Pt's speech intelligibility appears improved over previous admission (10/2018). Pt also demonstrated good safety awareness as he had pressed call bell and was waiting for nursing to help him ambulate to bathroom. Pt able to articulate difficulties regulating blood sugars. ST to sign off at this time.     SLP Assessment  SLP Recommendation/Assessment: Patient does not need any further Speech Lanaguage Pathology Services SLP Visit Diagnosis: Cognitive communication deficit (R41.841)    Follow Up Recommendations  None          SLP Evaluation Cognition  Overall Cognitive Status: Within Functional Limits for tasks assessed Arousal/Alertness: Awake/alert Orientation Level: Oriented X4       Comprehension   Auditory Comprehension Overall Auditory Comprehension: Appears within functional limits for tasks assessed Visual Recognition/Discrimination Discrimination: Within Function Limits Reading Comprehension Reading Status: Within funtional limits    Expression Expression Primary Mode of Expression: Verbal Verbal Expression Overall Verbal Expression: Appears within functional limits for tasks assessed Written Expression Dominant Hand: Right Written Expression: Not tested   Oral / Motor  Oral Motor/Sensory Function Overall Oral Motor/Sensory Function: Within functional limits Motor Speech Overall Motor Speech: Appears within functional limits for tasks assessed Respiration: Within functional limits Phonation: Normal Resonance: Within functional limits Articulation: Within functional limitis Intelligibility: Intelligible Motor Planning: Witnin functional limits Motor Speech Errors: Not applicable   GO                    [name] [name] 01/16/2019, 12:28 PM

## 2019-01-16 NOTE — Progress Notes (Signed)
Inpatient Diabetes Program Recommendations  AACE/ADA: New Consensus Statement on Inpatient Glycemic Control (2015)  Target Ranges:  Prepandial:   less than 140 mg/dL      Peak postprandial:   less than 180 mg/dL (1-2 hours)      Critically ill patients:  140 - 180 mg/dL   Lab Results  Component Value Date   GLUCAP 277 (H) 01/16/2019   HGBA1C 10.0 (H) 01/14/2019    Review of Glycemic Control Results for [name] [name], [name] [name] (MRN [id number]) as of 01/16/2019 14:20  Ref. Range 01/16/2019 06:29 01/16/2019 06:51 01/16/2019 07:40 01/16/2019 09:38  Glucose-Capillary Latest Ref Range: 70 - 99 mg/dL 495 (H) 448 (H) 401 (H) 277 (H)   Diabetes history: Type 2 DM Outpatient Diabetes medications: Novolog 25 unit TID, Levemir 40 units QPM, 45 units QAM Current orders for Inpatient glycemic control: Levemir 20 units BID, Novolog 0-12 units TID (custom)  Inpatient Diabetes Program Recommendations:     Spoke with patient regarding outpatient diabetes management. Patient had flat affect, difficult to engage and extract information. Has an apartment since he last met with DM coordinator. States, "Things have been just difficult here lately." When attempting to ask specific questions related to financial or social difficulty he reports, "I am fine." Denies food insecurity. Couldn't speak much on hypoglycemic event at admission. Denies having low blood sugars at home.  Reviewed patient's current A1c of 10% which is up from 8.2%. Explained what a A1c is and what it measures. Also reviewed goal A1c with patient, importance of good glucose control @ home, and blood sugar goals. Reviewed patho of DM, need for insulin, signs and symptoms of hypoglycemia, when to call MD, how often to be checking CBGs, vascular changes and comorbidites.  Patient was checking CBGs and has supplies. Question, despite discussion, if patient was missing dosages. Stressed the importance of following up with PCP, sees Dr Brigitte Pulse. He plans to make an  appointment to reschedule. Discussed recent meals, given large dose of meal coverage, states, "I haven't been eating as much here lately." Reviewed with patient the purpose for meal coverage and likelihood of having lows when low carb consumption and when to call MD for adjustments.  No further questions at this time.   Thanks, Bronson Curb, MSN, RNC-OB Diabetes Coordinator [phone] (8a-5p)

## 2019-01-16 NOTE — Plan of Care (Signed)
  Problem: Coping: Goal: Will verbalize positive feelings about self Outcome: Progressing   Problem: Education: Goal: Individualized Educational Video(s) Outcome: Progressing   Problem: Education: Goal: Knowledge of patient specific risk factors addressed and post discharge goals established will improve Outcome: Progressing   Problem: Education: Goal: Knowledge of secondary prevention will improve Outcome: Progressing   

## 2019-01-16 NOTE — Care Management Note (Signed)
Case Management Note  Patient Details  Name: [name] [name] MRN: [id number] Date of Birth: 05-17-1956  Subjective/Objective:    Pt in with stroke-like symptoms. He is from home alone. His daughter (who is an OT) can check in on him daily. DME: none At times has issues affording his medications.  No issues with transportation as he drives.                 Action/Plan: No PT/OT follow up. OT recommending 24 hour supervision. Per patient he does not have this. CM informed him of this recommendation. CM following for further d/c needs.   Expected Discharge Date:  01/16/19               Expected Discharge Plan:  Home/Self Care  In-House Referral:     Discharge planning Services     Post Acute Care Choice:    Choice offered to:     DME Arranged:    DME Agency:     HH Arranged:    HH Agency:     Status of Service:  In process, will continue to follow  If discussed at Long Length of Stay Meetings, dates discussed:    Additional Comments:  Kermit Balo, RN 01/16/2019, 12:13 PM

## 2019-01-16 NOTE — Plan of Care (Signed)
  Problem: Education: Goal: Knowledge of General Education information will improve Description Including pain rating scale, medication(s)/side effects and non-pharmacologic comfort measures Outcome: Progressing Note:  POC reviewed with pt.   

## 2019-01-16 NOTE — Progress Notes (Signed)
PROGRESS NOTE    DOMIQUE SLIMAN  AGT:[id number] DOB: June 08, 1956 DOA: 01/14/2019 PCP: Sherren Mocha, MD    Brief Narrative:  63 year old male who presented with lightheadedness, unsteady gait and tunnel vision.  He does have significant past medical history for coronary artery disease, type 2 diabetes mellitus, dyslipidemia, hypertension and depression.  Patient reported not feeling well, consistent with generalized weakness, disbalance, associated with hands and feet paresthesias.  He is on insulin therapy.  On his initial physical examination his blood pressure was 139/77, heart rate 88, respirate 16, oxygen saturation 97%, he had moist mucous membranes, his lungs were clear to auscultation bilaterally, heart S1-S2 present and rhythmic, the abdomen had mild tenderness in the epigastric region, no rebound no guarding, no lower extremity edema.  Right-sided facial droop and right upper extremity weakness 4 out of 5.  Positive dysmetria.  Sodium 138, potassium 3.3, chloride 105, bicarb 22, glucose 38, BUN 18, creatinine 1.0, AST 77, ALT 48, white count 8.2, hemoglobin 12.9, hematocrit 40.1, platelets 302.  Urine analysis negative for infection, glucose 50.  His urine tox screen was positive for opiates and tetrahydrocannabinol.  Head CT was negative for acute changes.  Chest x-ray with right base atelectasis.  EKG was sinus tachycardia, normal axis normal intervals.  Patient was admitted to the hospital with a working diagnosis of symptomatic hypoglycemia.    Assessment & Plan:   Principal Problem:   Stroke-like symptoms Active Problems:   DM (diabetes mellitus), type 2, uncontrolled (HCC)   Depression   GERD (gastroesophageal reflux disease)   Peripheral neuropathy   Hypoglycemia due to insulin   Cerebral thrombosis with cerebral infarction   Subarachnoid hemorrhage   1. T2DM  complicated with symptomatic hypoglycemia. Patient with severe hyperglycemia today with serum glucose in the 500's.  Will resume long acting insulin with insulin levimir 20 units bid, at home on close to 40 units bid. Will continue glucose cover and monitoring with insulin sliding scale, patient tolerating po well. Has persistent weakness, but no further focal neurologic deficit.   2. HTN. Blood pressure today 130 systolic, at home patient on metoprolol, lisinopril and furosemide, will continue to hold for now.   3. Seizures. On keppra, with no clinical signs of seizure activity.   4. GERD. Patient will continue pantoprazole. Tolerating po well.  5. CAD. Continue with asa and statin.   6. AKI with Hypokalemia. K has improved at 4,9, with serum cr at 1,34 from admission 1,0. Will continue to follow up on renal panel in am, avoid hypotension and nephrotoxic medications.  DVT prophylaxis: enoxaparin   Code Status: full Family Communication: no family at the bedside  Disposition Plan/ discharge barriers: pending clinical improvement   Body mass index is 36.63 kg/m. Malnutrition Type:      Malnutrition Characteristics:      Nutrition Interventions:     RN Pressure Injury Documentation:     Consultants:     Procedures:     Antimicrobials:       Subjective: Patient is feeling better but still very weak and deconditioned, his glucose has reached 500 mg/dl, no nausea or vomiting. No abdominal pain.    Objective: Vitals:   01/16/19 0000 01/16/19 0325 01/16/19 0630 01/16/19 0741  BP: 138/73 (!) 144/82 (!) 149/72 (!) 146/75  Pulse: 86  91 (!) 111  Resp: 18 18 16    Temp: 97.9 F (36.6 C) 98.2 F (36.8 C) 98.2 F (36.8 C)   TempSrc: Oral Oral Oral  SpO2: 98% 97% 95% 97%  Weight:      Height:        Intake/Output Summary (Last 24 hours) at 01/16/2019 1031 Last data filed at 01/16/2019 0328 Gross per 24 hour  Intake 240 ml  Output 1200 ml  Net -960 ml   Filed Weights   01/14/19 0509 01/14/19 2010  Weight: 95.3 kg 112.5 kg    Examination:   General:  deconditioned and ill looking appearing  Neurology: Awake and alert, non focal  E ENT: positive pallor, no icterus, oral mucosa moist Cardiovascular: No JVD. S1-S2 present, rhythmic, no gallops, rubs, or murmurs. Trace lower extremity edema. Pulmonary: positive breath sounds bilaterally, adequate air movement, no wheezing, rhonchi or rales. Gastrointestinal. Abdomen is protuberant with no organomegaly, non tender, no rebound or guarding Skin. No rashes Musculoskeletal: no joint deformities     Data Reviewed: I have personally reviewed following labs and imaging studies  CBC: Recent Labs  Lab 01/14/19 0804 01/14/19 2146  WBC 8.2 6.1  NEUTROABS 6.0  --   HGB 12.9* 13.1  HCT 40.1 40.9  MCV 87.9 85.2  PLT 302 278   Basic Metabolic Panel: Recent Labs  Lab 01/14/19 0804 01/14/19 2146 01/15/19 1323 01/16/19 0625  NA 138  --  133* 133*  K 3.3*  --  4.6 4.9  CL 105  --  104 100  CO2 22  --  23 19*  GLUCOSE 38*  --  412* 506*  BUN 18  --  9 14  CREATININE 1.04 1.02 1.32* 1.34*  CALCIUM 8.7*  --  8.2* 8.5*   GFR: Estimated Creatinine Clearance: 72.5 mL/min (A) (by C-G formula based on SCr of 1.34 mg/dL (H)). Liver Function Tests: Recent Labs  Lab 01/14/19 0804  AST 77*  ALT 48*  ALKPHOS 123  BILITOT 0.6  PROT 7.1  ALBUMIN 4.0   Recent Labs  Lab 01/14/19 0804  LIPASE 28   No results for input(s): AMMONIA in the last 168 hours. Coagulation Profile: Recent Labs  Lab 01/14/19 0804  INR 0.80   Cardiac Enzymes: No results for input(s): CKTOTAL, CKMB, CKMBINDEX, TROPONINI in the last 168 hours. BNP (last 3 results) No results for input(s): PROBNP in the last 8760 hours. HbA1C: Recent Labs    01/14/19 0804  HGBA1C 10.0*   CBG: Recent Labs  Lab 01/16/19 0405 01/16/19 0629 01/16/19 0651 01/16/19 0740 01/16/19 0938  GLUCAP 432* 495* 448* 401* 277*   Lipid Profile: No results for input(s): CHOL, HDL, LDLCALC, TRIG, CHOLHDL, LDLDIRECT in the last 72  hours. Thyroid Function Tests: No results for input(s): TSH, T4TOTAL, FREET4, T3FREE, THYROIDAB in the last 72 hours. Anemia Panel: No results for input(s): VITAMINB12, FOLATE, FERRITIN, TIBC, IRON, RETICCTPCT in the last 72 hours.    Radiology Studies: I have reviewed all of the imaging during this hospital visit personally     Scheduled Meds: .  stroke: mapping our early stages of recovery book   Does not apply Once  . aspirin  325 mg Oral Daily  . enoxaparin (LOVENOX) injection  40 mg Subcutaneous Q24H  . insulin aspart  0-12 Units Subcutaneous TID WC  . insulin detemir  20 Units Subcutaneous BID  . levETIRAcetam  500 mg Oral BID  . pantoprazole  40 mg Oral Daily  . rosuvastatin  40 mg Oral Daily   Continuous Infusions:   LOS: 0 days        Mauricio Annett Gula, MD

## 2019-01-17 DIAGNOSIS — G63 Polyneuropathy in diseases classified elsewhere: Secondary | ICD-10-CM | POA: Diagnosis not present

## 2019-01-17 DIAGNOSIS — R69 Illness, unspecified: Secondary | ICD-10-CM | POA: Diagnosis not present

## 2019-01-17 DIAGNOSIS — E160 Drug-induced hypoglycemia without coma: Secondary | ICD-10-CM | POA: Diagnosis not present

## 2019-01-17 DIAGNOSIS — E1165 Type 2 diabetes mellitus with hyperglycemia: Secondary | ICD-10-CM | POA: Diagnosis not present

## 2019-01-17 DIAGNOSIS — T383X5A Adverse effect of insulin and oral hypoglycemic [antidiabetic] drugs, initial encounter: Secondary | ICD-10-CM | POA: Diagnosis not present

## 2019-01-17 DIAGNOSIS — F329 Major depressive disorder, single episode, unspecified: Secondary | ICD-10-CM | POA: Diagnosis not present

## 2019-01-17 LAB — GLUCOSE, CAPILLARY
Glucose-Capillary: 128 mg/dL — ABNORMAL HIGH (ref 70–99)
Glucose-Capillary: 223 mg/dL — ABNORMAL HIGH (ref 70–99)

## 2019-01-17 LAB — BASIC METABOLIC PANEL
Anion gap: 11 (ref 5–15)
BUN: 13 mg/dL (ref 6–20)
CO2: 23 mmol/L (ref 22–32)
Calcium: 8.8 mg/dL — ABNORMAL LOW (ref 8.9–10.3)
Chloride: 104 mmol/L (ref 98–111)
Creatinine, Ser: 0.98 mg/dL (ref 0.61–1.24)
GFR calc Af Amer: >60 mL/min (ref >60)
GFR calc non Af Amer: >60 mL/min (ref >60)
Glucose, Bld: 136 mg/dL — ABNORMAL HIGH (ref 70–99)
POTASSIUM: 3.7 mmol/L (ref 3.5–5.1)
Sodium: 138 mmol/L (ref 135–145)

## 2019-01-17 MED ORDER — INSULIN ASPART 100 UNIT/ML ~~LOC~~ SOLN: 0.0000 [IU] | Freq: Three times a day (TID) | SUBCUTANEOUS | 0 refills | Status: DC

## 2019-01-17 MED ORDER — INSULIN DETEMIR 100 UNIT/ML ~~LOC~~ SOLN: 20.0000 [IU] | Freq: Two times a day (BID) | SUBCUTANEOUS | 0 refills | Status: DC

## 2019-01-17 NOTE — Care Management Note (Signed)
Case Management Note  Patient Details  Name: [name] [name] MRN: [id number] Date of Birth: 04-06-1956  Subjective/Objective:                    Action/Plan: Pt discharging home today with self care. Pt has hospital f/u and his car is at [employer]. Security to transport him to [employer]. No further needs per CM.  Expected Discharge Date:  01/17/19               Expected Discharge Plan:  Home/Self Care  In-House Referral:     Discharge planning Services     Post Acute Care Choice:    Choice offered to:     DME Arranged:    DME Agency:     HH Arranged:    HH Agency:     Status of Service:  Completed, signed off  If discussed at [employer] of Stay Meetings, dates discussed:    Additional Comments:  Kermit Balo, RN 01/17/2019, 10:22 AM

## 2019-01-17 NOTE — Discharge Summary (Signed)
Physician Discharge Summary  [name] UUV:[id number] DOB: 1956/07/29 DOA: 01/14/2019  PCP: Sherren Mocha, MD  Admit date: 01/14/2019 Discharge date: 01/17/2019  Admitted From: Home  Disposition:  Home   Recommendations for Outpatient Follow-up and new medication changes:  1. Follow up with Dr. [name] in 7 days.  2. Insulin regimen has been modified to 20 units bid of levimir, plus a sliding scale with meals.   Home Health: no   Equipment/Devices: no    Discharge Condition: stable  CODE STATUS: full  Diet recommendation: heart healthy and diabetic prudent.   Brief/Interim Summary: 63 year old male who presented with lightheadedness, unsteady gait and tunnel vision. He does have significant past medical history for coronary artery disease, type 2 diabetes mellitus, dyslipidemia, hypertension and depression. Patient reported not feeling well, consistent with generalized weakness, disbalance, associated with hands and feet paresthesias.He is on insulin therapy and his po intake has decreases recently. On his initial physical examination his blood pressure was 139/77, heart rate 88, respiratory rate 16, oxygen saturation 97%,he had moist mucous membranes, his lungs were clear to auscultation bilaterally, heart S1-S2 present and rhythmic, the abdomen had mild tenderness in the epigastric region, no rebound no guarding, no lower extremity edema. Right-sided facial droop and right upper extremity weakness 4 out of 5. Positive dysmetria. Sodium 138, potassium 3.3, chloride 105, bicarb 22, glucose 38, BUN 18, creatinine 1.0, AST 77, ALT 48, white count 8.2, hemoglobin 12.9, hematocrit 40.1, platelets 302.Urine analysis negative for infection,glucose 50. His urine tox screen was positive for opiates and tetrahydrocannabinol. Head CT was negative for acute changes. Chest x-ray with right base atelectasis. EKG was sinus tachycardia, normal axis normal intervals.  Patient was admitted to the  hospital with a working diagnosis of symptomatic hypoglycemia.  1.  Type 2 diabetes mellitus, complicated with symptomatic hypoglycemia.  Patient was admitted to the medical ward, he was placed on a remote telemetry monitor, received IV dextrose with D10 with improvement of his serum glucose.  His neurologic deficit resolved.  He was slowly transitioned back to subcutaneous insulin, with good toleration.  Currently he is on 20 units twice daily of insulin Levemir plus insulin sliding scale with meals, his fasting glucose this morning is 136. Patient was advised to continue this regimen at home, continue capillary glucose monitoring at home.  His hemoglobin A1c is 10.0.  2.  Hypertension.  His antihypertensive agents were held during his hospitalization to prevent hypotension, at discharge he will resume metoprolol, and lisinopril.  3.  Acute kidney injury with hypokalemia.  Patient had worsening kidney function with a peak creatinine of 1.34, at discharge is down to 0.98, potassium 3.7 and serum bicarbonate of 23 sodium 138.  Patient underwent echocardiography with ejection fraction 60 to 65%, no valvular disease.  At this point will recommend stopping furosemide to avoid kidney injury and electrolyte normalities.  Continue ACE inhibitor with lisinopril.  4.  Seizures.  At one point time patient was on Keppra currently not taking this medication.  5.  GERD.  Continue pantoprazole.  6.  Coronary artery disease.  Patient will continue aspirin and statin therapy.    Discharge Diagnoses:  Principal Problem:   Stroke-like symptoms Active Problems:   DM (diabetes mellitus), type 2, uncontrolled (HCC)   Depression   GERD (gastroesophageal reflux disease)   Peripheral neuropathy   Hypoglycemia due to insulin   Cerebral thrombosis with cerebral infarction   Subarachnoid hemorrhage    Discharge Instructions   Allergies as of  01/17/2019   No Known Allergies     Medication List    STOP  taking these medications   furosemide 20 MG tablet Commonly known as:  LASIX   LEVEMIR FLEXTOUCH 100 UNIT/ML Pen Generic drug:  Insulin Detemir Replaced by:  insulin detemir 100 UNIT/ML injection   levETIRAcetam 500 MG tablet Commonly known as:  KEPPRA   NOVOLOG FLEXPEN 100 UNIT/ML FlexPen Generic drug:  insulin aspart Replaced by:  insulin aspart 100 UNIT/ML injection     TAKE these medications   EQ ASPIRIN ADULT LOW DOSE 81 MG EC tablet Generic drug:  aspirin TAKE ONE TABLET BY MOUTH ONCE DAILY   gabapentin 100 MG capsule Commonly known as:  NEURONTIN TAKE 2 CAPSULES BY MOUTH EVERY MORNING AND 2 CAPSULES BY MOUTH  AT BEDTIME What changed:    how much to take  how to take this  when to take this   HYDROcodone-acetaminophen 7.5-325 MG tablet Commonly known as:  NORCO Take 1 tablet by mouth every 6 (six) hours as needed for moderate pain.   ibuprofen 400 MG tablet Commonly known as:  ADVIL,MOTRIN Take 1 tablet (400 mg total) by mouth every 6 (six) hours as needed.   insulin aspart 100 UNIT/ML injection Commonly known as:  novoLOG Inject 0-15 Units into the skin 3 (three) times daily with meals for 30 days. For glucose 121 to 150 use 2 units, for 151 to 200 use 4 units, for 201-250 use 7 units, for 251 to 300 use 9 units, for 301 to 350 use 12 units, for 351 or greater use 15 units. Replaces:  NOVOLOG FLEXPEN 100 UNIT/ML FlexPen   insulin detemir 100 UNIT/ML injection Commonly known as:  LEVEMIR Inject 0.2 mLs (20 Units total) into the skin 2 (two) times daily for 30 days. Replaces:  LEVEMIR FLEXTOUCH 100 UNIT/ML Pen   lisinopril 2.5 MG tablet Commonly known as:  PRINIVIL,ZESTRIL TAKE 1 TABLET(2.5 MG) BY MOUTH DAILY What changed:  See the new instructions.   metoprolol tartrate 25 MG tablet Commonly known as:  LOPRESSOR TAKE 1/2 TABLET(12.5 MG) BY MOUTH TWICE DAILY What changed:  See the new instructions.   nitroGLYCERIN 0.4 MG SL tablet Commonly known as:   NITROSTAT Place 1 tablet (0.4 mg total) under the tongue every 5 (five) minutes as needed for chest pain (up to 3 doses).   pantoprazole 40 MG tablet Commonly known as:  PROTONIX TAKE 1 TABLET BY MOUTH EVERY DAY   rosuvastatin 40 MG tablet Commonly known as:  CRESTOR TAKE 1 TABLET(40 MG) BY MOUTH DAILY What changed:  See the new instructions.       No Known Allergies  Consultations:     Procedures/Studies: Dg Chest 2 View  Result Date: 01/14/2019 CLINICAL DATA:  Nausea with dizziness and shaking since last night. EXAM: CHEST - 2 VIEW COMPARISON:  11/20/2018 FINDINGS: The heart size and mediastinal contours are within normal limits. Both lungs are clear. No pleural effusion or pneumothorax. The visualized skeletal structures are unremarkable. IMPRESSION: No active cardiopulmonary disease. Electronically Signed   By: Amie Portland M.D.   On: 01/14/2019 11:37   Ct Head Wo Contrast  Result Date: 01/14/2019 CLINICAL DATA:  Nausea, dizziness, shaking and vomiting. EXAM: CT HEAD WITHOUT CONTRAST TECHNIQUE: Contiguous axial images were obtained from the base of the skull through the vertex without intravenous contrast. COMPARISON:  11/20/2018 FINDINGS: Brain: No evidence of acute infarction, hemorrhage, hydrocephalus, extra-axial collection or mass lesion/mass effect. Vascular: No hyperdense vessel or unexpected calcification.  Skull: Normal. Negative for fracture or focal lesion. Sinuses/Orbits: No acute finding. Other: None. IMPRESSION: Unremarkable head CT.  No acute findings. Electronically Signed   By: Irish Lack M.D.   On: 01/14/2019 11:42   Mr Maxine Glenn Neck Wo Contrast  Result Date: 01/14/2019 CLINICAL DATA:  Dizziness, nausea, and vomiting. Tingling in the arms and legs. Right facial droop. EXAM: MRI HEAD WITHOUT CONTRAST MRA HEAD WITHOUT CONTRAST MRA NECK WITHOUT CONTRAST TECHNIQUE: Multiplanar, multiecho pulse sequences of the brain and surrounding structures were obtained without  intravenous contrast. Angiographic images of the Circle of Willis were obtained using MRA technique without intravenous contrast. Angiographic images of the neck were obtained using MRA technique without intravenous contrast. Carotid stenosis measurements (when applicable) are obtained utilizing NASCET criteria, using the distal internal carotid diameter as the denominator. COMPARISON:  Head CT 01/14/2019. Head and neck CTA 11/20/2018. Head MRI 11/19/2018. FINDINGS: MRI HEAD FINDINGS Brain: There is no evidence of acute infarct, intracranial hemorrhage, mass, midline shift, or extra-axial fluid collection. Mild cerebral atrophy is not greater than expected for age. The brain is normal in signal. Vascular: Major intracranial vascular flow voids are preserved. Skull and upper cervical spine: Unremarkable bone marrow signal. Sinuses/Orbits: Unremarkable orbits. Mild mucosal thickening in the maxillary sinuses. Clear mastoid air cells. Other: None. MRA HEAD FINDINGS The visualized distal vertebral arteries are patent to the basilar with a slightly ectatic appearance of the proximal left V4 segment relative to the more distal vessel, unchanged. The left AICA and right SCA are patent and prominent in size. Right AICA, left SCA, and PICAs are not well evaluated. The basilar artery is widely patent. There are left larger than right posterior communicating arteries. PCAs are patent without evidence of significant proximal stenosis. The internal carotid arteries are widely patent from skull base to carotid termini. ACAs and MCAs are patent without evidence of proximal branch occlusion or significant proximal stenosis. No aneurysm is identified. MRA NECK FINDINGS Image quality is degraded by motion artifact and noncontrast technique. The aortic arch and proximal arch vessels are not well evaluated. The common carotid arteries are widely patent bilaterally allowing for poor visualization of the proximal vessels including their  origins. The cervical internal carotid arteries are patent without evidence of flow limiting stenosis. The appearance of mild narrowing at both carotid bifurcations is likely artifactual given normal appearance on the prior CTA. The vertebral arteries are patent and codominant with antegrade flow bilaterally, and there is no evidence of significant vertebral artery origin stenosis. IMPRESSION: 1. Unremarkable appearance of the brain for age. No acute intracranial abnormality. 2. Patent anterior and posterior intracranial arterial circulation without proximal branch occlusion or significant stenosis. 3. Negative neck MRA within limitations of motion and noncontrast technique. Electronically Signed   By: Sebastian Ache M.D.   On: 01/14/2019 18:52   Mr Brain Wo Contrast  Result Date: 01/14/2019 CLINICAL DATA:  Dizziness, nausea, and vomiting. Tingling in the arms and legs. Right facial droop. EXAM: MRI HEAD WITHOUT CONTRAST MRA HEAD WITHOUT CONTRAST MRA NECK WITHOUT CONTRAST TECHNIQUE: Multiplanar, multiecho pulse sequences of the brain and surrounding structures were obtained without intravenous contrast. Angiographic images of the Circle of Willis were obtained using MRA technique without intravenous contrast. Angiographic images of the neck were obtained using MRA technique without intravenous contrast. Carotid stenosis measurements (when applicable) are obtained utilizing NASCET criteria, using the distal internal carotid diameter as the denominator. COMPARISON:  Head CT 01/14/2019. Head and neck CTA 11/20/2018. Head MRI 11/19/2018. FINDINGS: MRI  HEAD FINDINGS Brain: There is no evidence of acute infarct, intracranial hemorrhage, mass, midline shift, or extra-axial fluid collection. Mild cerebral atrophy is not greater than expected for age. The brain is normal in signal. Vascular: Major intracranial vascular flow voids are preserved. Skull and upper cervical spine: Unremarkable bone marrow signal.  Sinuses/Orbits: Unremarkable orbits. Mild mucosal thickening in the maxillary sinuses. Clear mastoid air cells. Other: None. MRA HEAD FINDINGS The visualized distal vertebral arteries are patent to the basilar with a slightly ectatic appearance of the proximal left V4 segment relative to the more distal vessel, unchanged. The left AICA and right SCA are patent and prominent in size. Right AICA, left SCA, and PICAs are not well evaluated. The basilar artery is widely patent. There are left larger than right posterior communicating arteries. PCAs are patent without evidence of significant proximal stenosis. The internal carotid arteries are widely patent from skull base to carotid termini. ACAs and MCAs are patent without evidence of proximal branch occlusion or significant proximal stenosis. No aneurysm is identified. MRA NECK FINDINGS Image quality is degraded by motion artifact and noncontrast technique. The aortic arch and proximal arch vessels are not well evaluated. The common carotid arteries are widely patent bilaterally allowing for poor visualization of the proximal vessels including their origins. The cervical internal carotid arteries are patent without evidence of flow limiting stenosis. The appearance of mild narrowing at both carotid bifurcations is likely artifactual given normal appearance on the prior CTA. The vertebral arteries are patent and codominant with antegrade flow bilaterally, and there is no evidence of significant vertebral artery origin stenosis. IMPRESSION: 1. Unremarkable appearance of the brain for age. No acute intracranial abnormality. 2. Patent anterior and posterior intracranial arterial circulation without proximal branch occlusion or significant stenosis. 3. Negative neck MRA within limitations of motion and noncontrast technique. Electronically Signed   By: Sebastian Ache M.D.   On: 01/14/2019 18:52   Mr Maxine Glenn Head Wo Contrast  Result Date: 01/14/2019 CLINICAL DATA:  Dizziness,  nausea, and vomiting. Tingling in the arms and legs. Right facial droop. EXAM: MRI HEAD WITHOUT CONTRAST MRA HEAD WITHOUT CONTRAST MRA NECK WITHOUT CONTRAST TECHNIQUE: Multiplanar, multiecho pulse sequences of the brain and surrounding structures were obtained without intravenous contrast. Angiographic images of the Circle of Willis were obtained using MRA technique without intravenous contrast. Angiographic images of the neck were obtained using MRA technique without intravenous contrast. Carotid stenosis measurements (when applicable) are obtained utilizing NASCET criteria, using the distal internal carotid diameter as the denominator. COMPARISON:  Head CT 01/14/2019. Head and neck CTA 11/20/2018. Head MRI 11/19/2018. FINDINGS: MRI HEAD FINDINGS Brain: There is no evidence of acute infarct, intracranial hemorrhage, mass, midline shift, or extra-axial fluid collection. Mild cerebral atrophy is not greater than expected for age. The brain is normal in signal. Vascular: Major intracranial vascular flow voids are preserved. Skull and upper cervical spine: Unremarkable bone marrow signal. Sinuses/Orbits: Unremarkable orbits. Mild mucosal thickening in the maxillary sinuses. Clear mastoid air cells. Other: None. MRA HEAD FINDINGS The visualized distal vertebral arteries are patent to the basilar with a slightly ectatic appearance of the proximal left V4 segment relative to the more distal vessel, unchanged. The left AICA and right SCA are patent and prominent in size. Right AICA, left SCA, and PICAs are not well evaluated. The basilar artery is widely patent. There are left larger than right posterior communicating arteries. PCAs are patent without evidence of significant proximal stenosis. The internal carotid arteries are widely patent from  skull base to carotid termini. ACAs and MCAs are patent without evidence of proximal branch occlusion or significant proximal stenosis. No aneurysm is identified. MRA NECK FINDINGS  Image quality is degraded by motion artifact and noncontrast technique. The aortic arch and proximal arch vessels are not well evaluated. The common carotid arteries are widely patent bilaterally allowing for poor visualization of the proximal vessels including their origins. The cervical internal carotid arteries are patent without evidence of flow limiting stenosis. The appearance of mild narrowing at both carotid bifurcations is likely artifactual given normal appearance on the prior CTA. The vertebral arteries are patent and codominant with antegrade flow bilaterally, and there is no evidence of significant vertebral artery origin stenosis. IMPRESSION: 1. Unremarkable appearance of the brain for age. No acute intracranial abnormality. 2. Patent anterior and posterior intracranial arterial circulation without proximal branch occlusion or significant stenosis. 3. Negative neck MRA within limitations of motion and noncontrast technique. Electronically Signed   By: Sebastian Ache M.D.   On: 01/14/2019 18:52   US Abdomen Limited Ruq  Result Date: 01/14/2019 CLINICAL DATA:  Elevated liver function tests. EXAM: ULTRASOUND ABDOMEN LIMITED RIGHT UPPER QUADRANT COMPARISON:  None. FINDINGS: Gallbladder: No gallstones or wall thickening visualized. No sonographic Murphy sign noted by sonographer. Common bile duct: Diameter: 6.4 mm Liver: No focal lesion identified. There is diffuse increased echotexture of the liver. Portal vein is patent on color Doppler imaging with normal direction of blood flow towards the liver. IMPRESSION: Diffuse increased echotexture of the liver, nonspecific can be seen in fatty infiltration of liver. No acute abnormality identified. Electronically Signed   By: Sherian Rein M.D.   On: 01/14/2019 19:09       Subjective: Patient is feeling well, no nausea or vomiting, no weakness. He is toleating po well.   Discharge Exam: Vitals:   01/17/19 0304 01/17/19 0814  BP: 120/62 140/80   Pulse: 79 79  Resp: 18 18  Temp: 98 F (36.7 C) 98.3 F (36.8 C)  SpO2: 98% 94%   Vitals:   01/16/19 2008 01/16/19 2353 01/17/19 0304 01/17/19 0814  BP: 124/74 117/68 120/62 140/80  Pulse: 83 76 79 79  Resp: 20 18 18 18   Temp: 98.2 F (36.8 C) 98.2 F (36.8 C) 98 F (36.7 C) 98.3 F (36.8 C)  TempSrc: Oral Oral Oral Oral  SpO2: 96% 96% 98% 94%  Weight:      Height:        General: Not in pain or dyspnea Neurology: Awake and alert, non focal  E ENT: mild pallor, no icterus, oral mucosa moist Cardiovascular: No JVD. S1-S2 present, rhythmic, no gallops, rubs, or murmurs. No lower extremity edema. Pulmonary: positive breath sounds bilaterally, adequate air movement, no wheezing, rhonchi or rales. Gastrointestinal. Abdomen with no organomegaly, non tender, no rebound or guarding Skin. No rashes Musculoskeletal: no joint deformities   The results of significant diagnostics from this hospitalization (including imaging, microbiology, ancillary and laboratory) are listed below for reference.     Microbiology: No results found for this or any previous visit (from the past 240 hour(s)).   Labs: BNP (last 3 results) Recent Labs    11/12/18 0529 11/19/18 0819  BNP 15.0 10.2   Basic Metabolic Panel: Recent Labs  Lab 01/14/19 0804 01/14/19 2146 01/15/19 1323 01/16/19 0625 01/17/19 0525  NA 138  --  133* 133* 138  K 3.3*  --  4.6 4.9 3.7  CL 105  --  104 100 104  CO2 22  --  23 19* 23  GLUCOSE 38*  --  412* 506* 136*  BUN 18  --  9 14 13   CREATININE 1.04 1.02 1.32* 1.34* 0.98  CALCIUM 8.7*  --  8.2* 8.5* 8.8*   Liver Function Tests: Recent Labs  Lab 01/14/19 0804  AST 77*  ALT 48*  ALKPHOS 123  BILITOT 0.6  PROT 7.1  ALBUMIN 4.0   Recent Labs  Lab 01/14/19 0804  LIPASE 28   No results for input(s): AMMONIA in the last 168 hours. CBC: Recent Labs  Lab 01/14/19 0804 01/14/19 2146  WBC 8.2 6.1  NEUTROABS 6.0  --   HGB 12.9* 13.1  HCT 40.1 40.9   MCV 87.9 85.2  PLT 302 278   Cardiac Enzymes: No results for input(s): CKTOTAL, CKMB, CKMBINDEX, TROPONINI in the last 168 hours. BNP: Invalid input(s): POCBNP CBG: Recent Labs  Lab 01/16/19 0740 01/16/19 0938 01/16/19 1520 01/16/19 2115 01/17/19 0606  GLUCAP 401* 277* 385* 153* 128*   D-Dimer No results for input(s): DDIMER in the last 72 hours. Hgb A1c No results for input(s): HGBA1C in the last 72 hours. Lipid Profile No results for input(s): CHOL, HDL, LDLCALC, TRIG, CHOLHDL, LDLDIRECT in the last 72 hours. Thyroid function studies No results for input(s): TSH, T4TOTAL, T3FREE, THYROIDAB in the last 72 hours.  Invalid input(s): FREET3 Anemia work up No results for input(s): VITAMINB12, FOLATE, FERRITIN, TIBC, IRON, RETICCTPCT in the last 72 hours. Urinalysis    Component Value Date/Time   COLORURINE YELLOW 01/14/2019 1115   APPEARANCEUR CLEAR 01/14/2019 1115   LABSPEC 1.015 01/14/2019 1115   PHURINE 5.0 01/14/2019 1115   GLUCOSEU 50 (A) 01/14/2019 1115   HGBUR NEGATIVE 01/14/2019 1115   BILIRUBINUR NEGATIVE 01/14/2019 1115   BILIRUBINUR negative 03/11/2018 1613   KETONESUR NEGATIVE 01/14/2019 1115   PROTEINUR NEGATIVE 01/14/2019 1115   UROBILINOGEN 0.2 03/11/2018 1613   UROBILINOGEN 0.2 06/14/2014 0809   NITRITE NEGATIVE 01/14/2019 1115   LEUKOCYTESUR NEGATIVE 01/14/2019 1115   Sepsis Labs Invalid input(s): PROCALCITONIN,  WBC,  LACTICIDVEN Microbiology No results found for this or any previous visit (from the past 240 hour(s)).   Time coordinating discharge: 45 minutes  SIGNED:   Coralie KeensMauricio Daniel Arlina Sabina, MD  [facility] 01/17/2019, 8:43 AM

## 2019-01-18 LAB — CBG MONITORING, ED: Glucose-Capillary: 564 mg/dL (ref 70–99)

## 2019-01-25 ENCOUNTER — Other Ambulatory Visit: Payer: Self-pay | Admitting: Family Medicine

## 2019-01-25 NOTE — Telephone Encounter (Signed)
Requested medication (s) are due for refill today:   Yes  Requested medication (s) are on the active medication list:   Yes  Future visit scheduled:   No   Last ordered: 12/05/2018 was given a 30 day supply with no refills by [facility] because medication failed the protocol.    Information sent to Dr. [name] for review for refill.   Requested Prescriptions  Pending Prescriptions Disp Refills   NOVOLOG FLEXPEN 100 UNIT/ML FlexPen [Pharmacy Med Name: NOVOLOG FLEXPEN INJ (ORANGE)] 30 mL 0    Sig: ADMINISTER 30 TO 35 UNITS UNDER THE SKIN THREE TIMES DAILY     Endocrinology:  Diabetes - Insulins Failed - 01/25/2019  8:18 AM      Failed - HBA1C is between 0 and 7.9 and within 180 days    Hgb A1c MFr Bld  Date Value Ref Range Status  01/14/2019 10.0 (H) 4.8 - 5.6 % Final    Comment:    (NOTE)         Prediabetes: 5.7 - 6.4         Diabetes: >6.4         Glycemic control for adults with diabetes: <7.0          Passed - Valid encounter within last 6 months    Recent Outpatient Visits          4 months ago Dental abscess   [facility] [place], [place] D, PA-C   10 months ago Uncontrolled type 1 diabetes mellitus with hyperglycemia [facility])   [facility], [place] July, MD   1 year ago Uncontrolled type 2 diabetes mellitus with hyperglycemia [facility])   [facility], Levell July, MD   1 year ago Uncontrolled type 2 diabetes mellitus with hyperglycemia, with long-term current use of insulin [facility])   [facility], Levell July, MD   2 years ago Insulin dependent type 2 diabetes mellitus, uncontrolled (HCC)   [facility], Levell July, MD

## 2019-01-27 ENCOUNTER — Telehealth: Payer: Self-pay | Admitting: *Deleted

## 2019-01-27 DIAGNOSIS — G63 Polyneuropathy in diseases classified elsewhere: Secondary | ICD-10-CM

## 2019-01-27 MED ORDER — HYDROCODONE-ACETAMINOPHEN 7.5-325 MG PO TABS: 1.0000 | ORAL_TABLET | Freq: Four times a day (QID) | ORAL | 0 refills | Status: DC | PRN

## 2019-01-27 NOTE — Telephone Encounter (Signed)
PMP was Reviewed: Hydrocodone prescription sent: [name] [name] is aware. Next scheduled visit is 02/03/2019

## 2019-01-27 NOTE — Telephone Encounter (Signed)
[name] [name] says he will be out of his pain medication before his visit on 02/03/19.  Last fill was 12/31/18 by PMP.

## 2019-01-28 ENCOUNTER — Other Ambulatory Visit: Payer: Self-pay

## 2019-01-28 ENCOUNTER — Emergency Department (HOSPITAL_COMMUNITY): Payer: Medicare HMO

## 2019-01-28 ENCOUNTER — Emergency Department (HOSPITAL_COMMUNITY)
Admission: EM | Admit: 2019-01-28 | Discharge: 2019-01-28 | Disposition: A | Discharge status: Home / Self Care | Payer: Medicare HMO | Attending: Emergency Medicine | Admitting: Emergency Medicine

## 2019-01-28 ENCOUNTER — Encounter (HOSPITAL_COMMUNITY): Payer: Self-pay

## 2019-01-28 DIAGNOSIS — I10 Essential (primary) hypertension: Secondary | ICD-10-CM | POA: Diagnosis not present

## 2019-01-28 DIAGNOSIS — R197 Diarrhea, unspecified: Secondary | ICD-10-CM | POA: Diagnosis not present

## 2019-01-28 DIAGNOSIS — R1110 Vomiting, unspecified: Secondary | ICD-10-CM | POA: Diagnosis present

## 2019-01-28 DIAGNOSIS — Z79899 Other long term (current) drug therapy: Secondary | ICD-10-CM | POA: Diagnosis not present

## 2019-01-28 DIAGNOSIS — J069 Acute upper respiratory infection, unspecified: Secondary | ICD-10-CM | POA: Diagnosis not present

## 2019-01-28 DIAGNOSIS — Z7982 Long term (current) use of aspirin: Secondary | ICD-10-CM | POA: Diagnosis not present

## 2019-01-28 DIAGNOSIS — Z794 Long term (current) use of insulin: Secondary | ICD-10-CM | POA: Insufficient documentation

## 2019-01-28 DIAGNOSIS — E1165 Type 2 diabetes mellitus with hyperglycemia: Secondary | ICD-10-CM | POA: Insufficient documentation

## 2019-01-28 DIAGNOSIS — I2510 Atherosclerotic heart disease of native coronary artery without angina pectoris: Secondary | ICD-10-CM | POA: Insufficient documentation

## 2019-01-28 DIAGNOSIS — Z87891 Personal history of nicotine dependence: Secondary | ICD-10-CM | POA: Insufficient documentation

## 2019-01-28 DIAGNOSIS — R112 Nausea with vomiting, unspecified: Secondary | ICD-10-CM | POA: Diagnosis not present

## 2019-01-28 DIAGNOSIS — R739 Hyperglycemia, unspecified: Secondary | ICD-10-CM

## 2019-01-28 DIAGNOSIS — R05 Cough: Secondary | ICD-10-CM | POA: Diagnosis not present

## 2019-01-28 LAB — BLOOD GAS, VENOUS
Acid-base deficit: 2.9 mmol/L — ABNORMAL HIGH (ref 0.0–2.0)
Bicarbonate: 22.7 mmol/L (ref 20.0–28.0)
O2 Saturation: 71.5 %
Patient temperature: 98.6
pCO2, Ven: 45.7 mmHg (ref 44.0–60.0)
pH, Ven: 7.318 (ref 7.250–7.430)
pO2, Ven: 40.7 mmHg (ref 32.0–45.0)

## 2019-01-28 LAB — HEPATIC FUNCTION PANEL
ALT: 20 U/L (ref 0–44)
AST: 34 U/L (ref 15–41)
Albumin: 3.8 g/dL (ref 3.5–5.0)
Alkaline Phosphatase: 142 U/L — ABNORMAL HIGH (ref 38–126)
Bilirubin, Direct: 0.1 mg/dL (ref 0.0–0.2)
Indirect Bilirubin: 0.2 mg/dL — ABNORMAL LOW (ref 0.3–0.9)
Total Bilirubin: 0.3 mg/dL (ref 0.3–1.2)
Total Protein: 6.7 g/dL (ref 6.5–8.1)

## 2019-01-28 LAB — CBC WITH DIFFERENTIAL/PLATELET
Abs Immature Granulocytes: 0 10*3/uL (ref 0.00–0.07)
Basophils Absolute: 0.1 10*3/uL (ref 0.0–0.1)
Basophils Relative: 1 %
Eosinophils Absolute: 0.2 10*3/uL (ref 0.0–0.5)
Eosinophils Relative: 6 %
HCT: 40.2 % (ref 39.0–52.0)
Hemoglobin: 12.3 g/dL — ABNORMAL LOW (ref 13.0–17.0)
Immature Granulocytes: 0 %
LYMPHS PCT: 34 %
Lymphs Abs: 1.3 10*3/uL (ref 0.7–4.0)
MCH: 27.9 pg (ref 26.0–34.0)
MCHC: 30.6 g/dL (ref 30.0–36.0)
MCV: 91.2 fL (ref 80.0–100.0)
Monocytes Absolute: 0.4 10*3/uL (ref 0.1–1.0)
Monocytes Relative: 10 %
Neutro Abs: 2 10*3/uL (ref 1.7–7.7)
Neutrophils Relative %: 49 %
Platelets: 235 10*3/uL (ref 150–400)
RBC: 4.41 MIL/uL (ref 4.22–5.81)
RDW: 13.1 % (ref 11.5–15.5)
WBC: 3.9 10*3/uL — ABNORMAL LOW (ref 4.0–10.5)
nRBC: 0 % (ref 0.0–0.2)

## 2019-01-28 LAB — I-STAT TROPONIN, ED
Troponin i, poc: 0.01 ng/mL (ref 0.00–0.08)
Troponin i, poc: 0.01 ng/mL (ref 0.00–0.08)
Troponin i, poc: 0.01 ng/mL (ref 0.00–0.08)

## 2019-01-28 LAB — URINALYSIS, ROUTINE W REFLEX MICROSCOPIC
BILIRUBIN URINE: NEGATIVE
Bacteria, UA: NONE SEEN
Glucose, UA: >=500 mg/dL — AB
HGB URINE DIPSTICK: NEGATIVE
Ketones, ur: NEGATIVE mg/dL
Leukocytes,Ua: NEGATIVE
Nitrite: NEGATIVE
Protein, ur: NEGATIVE mg/dL
Specific Gravity, Urine: 1.018 (ref 1.005–1.030)
pH: 6 (ref 5.0–8.0)

## 2019-01-28 LAB — CBG MONITORING, ED
Glucose-Capillary: 521 mg/dL (ref 70–99)
Glucose-Capillary: 519 mg/dL (ref 70–99)
Glucose-Capillary: 348 mg/dL — ABNORMAL HIGH (ref 70–99)
Glucose-Capillary: 148 mg/dL — ABNORMAL HIGH (ref 70–99)

## 2019-01-28 LAB — BASIC METABOLIC PANEL
ANION GAP: 11 (ref 5–15)
BUN: 14 mg/dL (ref 6–20)
CO2: 21 mmol/L — ABNORMAL LOW (ref 22–32)
Calcium: 8.1 mg/dL — ABNORMAL LOW (ref 8.9–10.3)
Chloride: 98 mmol/L (ref 98–111)
Creatinine, Ser: 1.08 mg/dL (ref 0.61–1.24)
GFR calc Af Amer: >60 mL/min (ref >60)
Glucose, Bld: 618 mg/dL (ref 70–99)
Potassium: 4.8 mmol/L (ref 3.5–5.1)
Sodium: 130 mmol/L — ABNORMAL LOW (ref 135–145)

## 2019-01-28 MED ORDER — INSULIN ASPART 100 UNIT/ML ~~LOC~~ SOLN
10.0000 [IU] | Freq: Once | SUBCUTANEOUS | Status: AC
  Administered 2019-01-28: 10 [IU] via INTRAVENOUS
  Filled 2019-01-28: qty 1

## 2019-01-28 MED ORDER — SODIUM CHLORIDE 0.9 % IV BOLUS
1000.0000 mL | Freq: Once | INTRAVENOUS | Status: AC
  Administered 2019-01-28: 1000 mL via INTRAVENOUS

## 2019-01-28 MED ORDER — SODIUM CHLORIDE 0.9 % IV SOLN: Freq: Once | INTRAVENOUS | Status: DC

## 2019-01-28 NOTE — ED Provider Notes (Signed)
[facility]-EMERGENCY DEPT Provider Note   CSN: [id number] Arrival date & time: 01/28/19  0906    History   Chief Complaint Chief Complaint  Patient presents with  . Emesis  . Shortness of Breath  . Cough  . Hyperglycemia    HPI [name] [name] is a 63 y.o. male.     HPI  Patient is a 63 yo male with a history of CAD, NSTEMI, T2 DM, insulin dependent, presenting for emesis, shortness of breath, lightheadedness, nasal congestion, and nonproductive cough. Patient reports that his glucose levels have been labile over the past couple weeks. He reports 2 episodes of emesis, nonbilious and nonbloody yesterday, and 2-3 episodes of loose stool without melena or hematochezia. He was admitted to the hospital on 01/14/2019 for hypoglycemia. He had insulin dosage adjustment and has not had any more hypoglycemia episodes. He reports that over the past couple of days he developed nasal congestion, rhinorrhea, and a nonproductive cough. He denies any fevers over this interval. He reports some mild chest "heaviness" and shortness of breath, but denies exertional symptoms. Patient reports that he took his Levemir dose this morning and 10 units of Novolog at 5am with his breakfast. No lower extremity edema, calf TTP, history of DVT/PE, or cancer history.  Past Medical History:  Diagnosis Date  . Barrett's esophagus   . CAD (coronary artery disease)    a. NSTEMI 05/2014 - occluded RCA dominant proximal s/p asp-thrombectomy/DES to RCA, minimal LAD/LCx, EF 50% by cath, 65-70% by echo.  . Depression   . Diabetes type 2, uncontrolled (HCC)   . Former tobacco use   . GERD (gastroesophageal reflux disease)   . Hemorrhoids    hx of  . Hypercholesterolemia   . Hypertension   . MI (myocardial infarction) (HCC)   . Peripheral neuropathy     Patient Active Problem List   Diagnosis Date Noted  . Cerebral thrombosis with cerebral infarction 01/15/2019  . Subarachnoid hemorrhage  01/15/2019  . Stroke-like symptoms 01/14/2019  . Hypoglycemia due to insulin 01/14/2019  . Noncompliance with medications   . Homeless   . DKA (diabetic ketoacidosis) (HCC) 11/27/2018  . Chest pain, atypical 11/19/2018  . Dizziness 11/19/2018  . Bilateral low back pain without sciatica 04/12/2018  . Vitamin D deficiency 04/07/2018  . Pill dysphagia 04/07/2018  . Enlarged thyroid gland 04/07/2018  . Labile blood glucose 04/07/2018  . Brittle diabetes mellitus (HCC) 04/07/2018  . AKI (acute kidney injury) (HCC) 09/22/2017  . Chronic pain syndrome 08/18/2016  . Diabetic ketoacidosis (HCC) 04/12/2016  . Hip pain, bilateral 08/23/2015  . Chronic neck pain 08/23/2015  . DKA (diabetic ketoacidoses) (HCC) 05/30/2015  . Acute renal failure (HCC) 05/30/2015  . Restless legs 08/31/2014  . Uncontrolled diabetes mellitus type 2 with peripheral artery disease (HCC) 08/10/2014  . Old myocardial infarction 08/10/2014  . CAD (coronary artery disease) 06/18/2014  . Hyperlipidemia 06/18/2014  . NSTEMI (non-ST elevated myocardial infarction) (HCC) 06/14/2014  . N&V (nausea and vomiting) 07/21/2013  . Chronic pain in left shoulder 04/06/2013  . Peripheral neuropathy 02/28/2012  . Depression 12/25/2011  . GERD (gastroesophageal reflux disease) 12/25/2011  . DM (diabetes mellitus), type 2, uncontrolled (HCC) 11/18/2011    Past Surgical History:  Procedure Laterality Date  . LEFT HEART CATHETERIZATION WITH CORONARY ANGIOGRAM N/A 06/14/2014   Procedure: LEFT HEART CATHETERIZATION WITH CORONARY ANGIOGRAM;  Surgeon: Corky Crafts, MD;  Location: [facility] CATH LAB;  Service: Cardiovascular;  Laterality: N/A;  . TONSILLECTOMY  Home Medications    Prior to Admission medications   Medication Sig Start Date End Date Taking? Authorizing Provider  EQ ASPIRIN ADULT LOW DOSE 81 MG EC tablet TAKE ONE TABLET BY MOUTH ONCE DAILY 07/02/15  Yes Corky Crafts, MD  gabapentin (NEURONTIN) 100 MG  capsule TAKE 2 CAPSULES BY MOUTH EVERY MORNING AND 2 CAPSULES BY MOUTH  AT BEDTIME Patient taking differently: Take 200 mg by mouth 2 (two) times daily. TAKE 2 CAPSULES BY MOUTH EVERY MORNING AND 2 CAPSULES BY MOUTH  AT BEDTIME 12/07/18  Yes Jones Bales, NP  HYDROcodone-acetaminophen (NORCO) 7.5-325 MG tablet Take 1 tablet by mouth every 6 (six) hours as needed for moderate pain. 01/27/19  Yes Jones Bales, NP  insulin aspart (NOVOLOG) 100 UNIT/ML injection Inject 0-15 Units into the skin 3 (three) times daily with meals for 30 days. For glucose 121 to 150 use 2 units, for 151 to 200 use 4 units, for 201-250 use 7 units, for 251 to 300 use 9 units, for 301 to 350 use 12 units, for 351 or greater use 15 units. 01/17/19 02/16/19 Yes Arrien, York Ram, MD  insulin detemir (LEVEMIR) 100 UNIT/ML injection Inject 0.2 mLs (20 Units total) into the skin 2 (two) times daily for 30 days. 01/17/19 02/16/19 Yes Arrien, York Ram, MD  lisinopril (PRINIVIL,ZESTRIL) 2.5 MG tablet TAKE 1 TABLET(2.5 MG) BY MOUTH DAILY Patient taking differently: Take 2.5 mg by mouth daily.  12/06/18  Yes Sherren Mocha, MD  methocarbamol (ROBAXIN) 500 MG tablet Take 500 mg by mouth 2 (two) times daily as needed for muscle spasms. 12/29/18  Yes [provider]  metoprolol tartrate (LOPRESSOR) 25 MG tablet TAKE 1/2 TABLET(12.5 MG) BY MOUTH TWICE DAILY Patient taking differently: Take 12.5 mg by mouth 2 (two) times daily.  09/12/18  Yes Sherren Mocha, MD  pantoprazole (PROTONIX) 40 MG tablet TAKE 1 TABLET BY MOUTH EVERY DAY 12/29/18  Yes Sherren Mocha, MD  rosuvastatin (CRESTOR) 40 MG tablet TAKE 1 TABLET(40 MG) BY MOUTH DAILY Patient taking differently: Take 40 mg by mouth daily.  12/29/18  Yes Sherren Mocha, MD  nitroGLYCERIN (NITROSTAT) 0.4 MG SL tablet Place 1 tablet (0.4 mg total) under the tongue every 5 (five) minutes as needed for chest pain (up to 3 doses). 11/10/16   Corky Crafts, MD    Family History Family  History  Problem Relation Age of Onset  . Hypertension Mother   . Alzheimer's disease Mother   . Alcohol abuse Father   . Cancer Father 27       "Throat"  . Diabetes type II Brother   . Cancer Brother 68       throat cancer  . Heart disease Neg Hx     Social History Social History   Tobacco Use  . Smoking status: Former Smoker    Packs/day: 0.25    Years: 3.00    Pack years: 0.75    Last attempt to quit: 11/30/1998    Years since quitting: 20.1  . Smokeless tobacco: Never Used  Substance Use Topics  . Alcohol use: No    Alcohol/week: 1.0 standard drinks    Types: 1 Standard drinks or equivalent per week  . Drug use: No     Allergies   Patient has no known allergies.   Review of Systems Review of Systems  Constitutional: Negative for chills and fever.  HENT: Positive for congestion, postnasal drip and rhinorrhea. Negative for sinus pain  and sore throat.   Eyes: Negative for visual disturbance.  Respiratory: Positive for cough. Negative for chest tightness and shortness of breath.   Cardiovascular: Negative for chest pain, palpitations and leg swelling.  Gastrointestinal: Positive for diarrhea, nausea and vomiting. Negative for abdominal pain.  Genitourinary: Negative for dysuria and flank pain.  Musculoskeletal: Negative for back pain and myalgias.  Skin: Negative for rash.  Neurological: Positive for light-headedness. Negative for dizziness, syncope, weakness and headaches.     Physical Exam Updated Vital Signs BP (!) 141/78   Pulse 90   Temp 97.6 F (36.4 C) (Oral)   Resp 18   Ht 5\' 9"  (1.753 m)   Wt 97.5 kg   SpO2 99%   BMI 31.75 kg/m   Physical Exam Vitals signs and nursing note reviewed.  Constitutional:      General: He is not in acute distress.    Appearance: He is well-developed. He is not ill-appearing, toxic-appearing or diaphoretic.  HENT:     Head: Normocephalic and atraumatic.     Mouth/Throat:     Comments: No erythema or edema of  pharynx.  Mucous membranes slightly dry.  Eyes:     Conjunctiva/sclera: Conjunctivae normal.     Pupils: Pupils are equal, round, and reactive to light.  Neck:     Musculoskeletal: Normal range of motion and neck supple.  Cardiovascular:     Rate and Rhythm: Normal rate and regular rhythm.     Heart sounds: S1 normal and S2 normal. No murmur.     Comments: No LE edema. No calf TTP.  Pulmonary:     Effort: Pulmonary effort is normal.     Breath sounds: Normal breath sounds. No wheezing or rales.  Abdominal:     General: Bowel sounds are normal. There is no distension.     Palpations: Abdomen is soft.     Tenderness: There is no abdominal tenderness. There is no guarding.  Musculoskeletal: Normal range of motion.        General: No deformity.  Lymphadenopathy:     Cervical: No cervical adenopathy.  Skin:    General: Skin is warm and dry.     Findings: No erythema or rash.  Neurological:     Mental Status: He is alert.     Comments: Cranial nerves grossly intact. Patient moves extremities symmetrically and with good coordination.  Psychiatric:        Behavior: Behavior normal.        Thought Content: Thought content normal.        Judgment: Judgment normal.      ED Treatments / Results  Labs (all labs ordered are listed, but only abnormal results are displayed) Labs Reviewed  BASIC METABOLIC PANEL - Abnormal; Notable for the following components:      Result Value   Sodium 130 (*)    CO2 21 (*)    Glucose, Bld 618 (*)    Calcium 8.1 (*)    All other components within normal limits  URINALYSIS, ROUTINE W REFLEX MICROSCOPIC - Abnormal; Notable for the following components:   Color, Urine COLORLESS (*)    Glucose, UA >=500 (*)    All other components within normal limits  HEPATIC FUNCTION PANEL - Abnormal; Notable for the following components:   Alkaline Phosphatase 142 (*)    Indirect Bilirubin 0.2 (*)    All other components within normal limits  BLOOD GAS, VENOUS  - Abnormal; Notable for the following components:   Acid-base deficit  2.9 (*)    All other components within normal limits  CBC WITH DIFFERENTIAL/PLATELET - Abnormal; Notable for the following components:   WBC 3.9 (*)    Hemoglobin 12.3 (*)    All other components within normal limits  CBG MONITORING, ED - Abnormal; Notable for the following components:   Glucose-Capillary 521 (*)    All other components within normal limits  CBG MONITORING, ED - Abnormal; Notable for the following components:   Glucose-Capillary 519 (*)    All other components within normal limits  CBG MONITORING, ED - Abnormal; Notable for the following components:   Glucose-Capillary 348 (*)    All other components within normal limits  CBG MONITORING, ED - Abnormal; Notable for the following components:   Glucose-Capillary 148 (*)    All other components within normal limits  I-STAT TROPONIN, ED  I-STAT TROPONIN, ED  I-STAT TROPONIN, ED    EKG EKG Interpretation  Date/Time:  Saturday January 28 2019 10:25:36 EST Ventricular Rate:  93 PR Interval:    QRS Duration: 80 QT Interval:  370 QTC Calculation: 461 R Axis:   26 Text Interpretation:  Sinus rhythm Baseline wander in lead(s) V1 No significant change since last tracing Confirmed by Alvira Monday ([id number]) on 01/28/2019 10:31:40 AM   Radiology Dg Chest 2 View  Result Date: 01/28/2019 CLINICAL DATA:  Nonproductive cough for 1 week EXAM: CHEST - 2 VIEW COMPARISON:  01/14/2019 FINDINGS: The heart size and mediastinal contours are within normal limits. Both lungs are clear. The visualized skeletal structures are unremarkable. IMPRESSION: No active cardiopulmonary disease. Electronically Signed   By: Alcide Clever M.D.   On: 01/28/2019 10:38    Procedures Procedures (including critical care time)  Medications Ordered in ED Medications  sodium chloride 0.9 % bolus 1,000 mL (1,000 mLs Intravenous New Bag/Given 01/28/19 1012)  sodium chloride 0.9 % bolus  1,000 mL (1,000 mLs Intravenous New Bag/Given 01/28/19 1012)     Initial Impression / Assessment and Plan / ED Course  I have reviewed the triage vital signs and the nursing notes.  Pertinent labs & imaging results that were available during my care of the patient were reviewed by me and considered in my medical decision making (see chart for details).  Clinical Course as of Jan 28 2015  Sat Jan 28, 2019  1116 Do not suspect DKA.   Anion gap: 11 [AM]  1116 Likely 2/2 hyperglycemia.   Sodium(!): 130 [AM]  1420 Pt has had consistently elevated alkaline phosphatase.   Alkaline Phosphatase(!): 142 [AM]    Clinical Course User Index [AM] Elisha Ponder, PA-C       Patient is nontoxic appearing, hemodynamically stable with normal mental status. Differential diagnosis includes DKA, viral gastroenteritis, pneumonia, ACS, viral URI. Do not suspect PE. Patient has normal vital signs, no ongoing CP or shortness of breath, and had Lovenox on recent admission.   Workup demonstrating initial glucose of 618. Patient rehydrated, given insulin,and glucose improved. No leukocytosis. Normal renal function. Troponin negative. EKG in NSR without evidence of acute ischemia, infarction, or arrhythmia. CXR without evidence of PNA.   Patient performing PO challenge without difficulty and tolerating solids and liquids. Suspect that hyperglycemia may be a stress response to viral URI. Recommended close follow up with primary care provider for recheck of insulin regimen and symptoms.   Return precautions given for any return of emesis, diarrhea, worsening or exertional CP or shortness of breath, or fevers.   This is a shared visit  with Dr. Alvira Monday. Patient was independently evaluated by this attending physician. Attending physician consulted in evaluation and discharge management.  Final Clinical Impressions(s) / ED Diagnoses   Final diagnoses:  Hyperglycemia  Upper respiratory tract infection,  unspecified type    ED Discharge Orders    None       Delia Chimes 01/28/19 2151    Alvira Monday, MD 01/30/19 [phone]

## 2019-01-28 NOTE — ED Notes (Signed)
Pt cannot use restroom at this time, aware urine specimen is needed. Urinal provided  

## 2019-01-28 NOTE — Discharge Instructions (Signed)
Please read and follow all provided instructions.  Your diagnoses today include:  1. Hyperglycemia   2. Upper respiratory tract infection, unspecified type     You appear to have an upper respiratory infection (URI). An upper respiratory tract infection, or cold, is a viral infection of the air passages leading to the lungs. It should improve gradually after 5-7 days. You may have a lingering cough that lasts for 2- 4 weeks after the infection.  Please make sure that you are checking your sugar at least 4 times a day.  Tests performed today include: Vital signs. See below for your results today.   Medications prescribed:   Take any prescribed medications only as directed. Treatment for your infection is aimed at treating the symptoms. There are no medications, such as antibiotics, that will cure your infection.   You may take guaifenesin or Mucinex over-the-counter.  This will help loosen the mucus in your sinuses and lungs.  Please resume your home medications with your insulin dosing.  Please do not increase the dosing without talking to your primary care provider.   Home care instructions:  Follow any educational materials contained in this packet.   Your illness is contagious and can be spread to others, especially during the first 3 or 4 days. It cannot be cured by antibiotics or other medicines. Take basic precautions such as washing your hands often, covering your mouth when you cough or sneeze, and avoiding public places where you could spread your illness to others.   Please continue drinking plenty of fluids.  Use over-the-counter medicines as needed as directed on packaging for symptom relief.  You may also use ibuprofen or tylenol as directed on packaging for pain or fever.  Do not take multiple medicines containing Tylenol or acetaminophen to avoid taking too much of this medication.  Follow-up instructions: Please follow-up with your primary care provider in the next 3  days for further evaluation of your symptoms if you are not feeling better.   Return instructions:  Please return to the Emergency Department if you experience worsening symptoms.  RETURN IMMEDIATELY IF you develop shortness of breath, chest pain, confusion or altered mental status, a new rash, become dizzy, faint, or poorly responsive, or are unable to be cared for at home. Please return if you have persistent vomiting and cannot keep down fluids or develop a fever that is not controlled by tylenol or motrin.   Please return if you have any other emergent concerns.  Additional Information:  Your vital signs today were: BP (!) 142/78    Pulse 85    Temp 97.6 F (36.4 C) (Oral)    Resp 14    Ht 5\' 9"  (1.753 m)    Wt 97.5 kg    SpO2 98%    BMI 31.75 kg/m  If your blood pressure (BP) was elevated above 135/85 this visit, please have this repeated by your doctor within one month. --------------

## 2019-01-28 NOTE — ED Triage Notes (Addendum)
Patient c/o non productive cough x 1 week, emesis x 2-3 days. Patient states his CBG at home hs been up and down.  CBG in triage-521.

## 2019-02-02 DIAGNOSIS — R69 Illness, unspecified: Secondary | ICD-10-CM | POA: Diagnosis not present

## 2019-02-03 ENCOUNTER — Encounter: Payer: Self-pay | Admitting: Registered Nurse

## 2019-02-03 ENCOUNTER — Encounter: Payer: Medicare HMO | Attending: Physical Medicine & Rehabilitation | Admitting: Registered Nurse

## 2019-02-03 VITALS — BP 145/86 | HR 83 | Resp 14 | Ht 69.0 in | Wt 249.0 lb

## 2019-02-03 DIAGNOSIS — M25552 Pain in left hip: Secondary | ICD-10-CM | POA: Insufficient documentation

## 2019-02-03 DIAGNOSIS — M25551 Pain in right hip: Secondary | ICD-10-CM | POA: Insufficient documentation

## 2019-02-03 DIAGNOSIS — G894 Chronic pain syndrome: Secondary | ICD-10-CM | POA: Diagnosis not present

## 2019-02-03 DIAGNOSIS — M25571 Pain in right ankle and joints of right foot: Secondary | ICD-10-CM | POA: Diagnosis not present

## 2019-02-03 DIAGNOSIS — M5416 Radiculopathy, lumbar region: Secondary | ICD-10-CM

## 2019-02-03 DIAGNOSIS — Z79891 Long term (current) use of opiate analgesic: Secondary | ICD-10-CM

## 2019-02-03 DIAGNOSIS — Z5181 Encounter for therapeutic drug level monitoring: Secondary | ICD-10-CM | POA: Diagnosis not present

## 2019-02-03 DIAGNOSIS — M25572 Pain in left ankle and joints of left foot: Secondary | ICD-10-CM | POA: Diagnosis not present

## 2019-02-03 DIAGNOSIS — M7062 Trochanteric bursitis, left hip: Secondary | ICD-10-CM

## 2019-02-03 DIAGNOSIS — M799 Soft tissue disorder, unspecified: Secondary | ICD-10-CM | POA: Insufficient documentation

## 2019-02-03 DIAGNOSIS — E119 Type 2 diabetes mellitus without complications: Secondary | ICD-10-CM | POA: Diagnosis not present

## 2019-02-03 DIAGNOSIS — M7989 Other specified soft tissue disorders: Secondary | ICD-10-CM | POA: Diagnosis not present

## 2019-02-03 DIAGNOSIS — G63 Polyneuropathy in diseases classified elsewhere: Secondary | ICD-10-CM | POA: Diagnosis not present

## 2019-02-03 DIAGNOSIS — Z79899 Other long term (current) drug therapy: Secondary | ICD-10-CM | POA: Insufficient documentation

## 2019-02-03 DIAGNOSIS — M7061 Trochanteric bursitis, right hip: Secondary | ICD-10-CM

## 2019-02-03 MED ORDER — HYDROCODONE-ACETAMINOPHEN 7.5-325 MG PO TABS: 1.0000 | ORAL_TABLET | Freq: Four times a day (QID) | ORAL | 0 refills | Status: DC | PRN

## 2019-02-03 NOTE — Progress Notes (Signed)
Subjective:    Patient ID: [name] [name], male    DOB: 1956-10-22, 63 y.o.   MRN: [id number]  HPI: [name] [name] is a 63 y.o. male who returns for follow up appointment for chronic pain and medication refill. He states his pain is located in his lower back radiating into his bilateral hips L>R and left lower extremity. Also reports he had bilateral ankle pain. He rates his  Pain 5. His current exercise regime is walking and performing stretching exercises.  [name] [name] was admitted to [facility] on 01/14/2019 and discharged on 01/17/2019, for hypoglycemia.   On 01/28/2019 he went to [facility] Emergency Room for hyperglycemia, note was reviewed.   [name] [name] Morphine equivalent is 30.00 MME.  Last UDS was Performed on 05/11/2018, it was consistent.   Pain Inventory Average Pain 4 Pain Right Now 5 My pain is constant, sharp and aching  In the last 24 hours, has pain interfered with the following? General activity 4 Relation with others 4 Enjoyment of life 3 What TIME of day is your pain at its worst? morning, night Sleep (in general) Poor  Pain is worse with: inactivity and standing Pain improves with: rest, medication and injections Relief from Meds: 7  Mobility walk without assistance how many minutes can you walk? 10 ability to climb steps?  yes do you drive?  yes Do you have any goals in this area?  no  Function disabled: date disabled . I need assistance with the following:  dressing Do you have any goals in this area?  no  Neuro/Psych weakness numbness tingling  Prior Studies Any changes since last visit?  no  Physicians involved in your care Any changes since last visit?  no   Family History  Problem Relation Age of Onset  . Hypertension Mother   . Alzheimer's disease Mother   . Alcohol abuse Father   . Cancer Father 26       "Throat"  . Diabetes type II Brother   . Cancer Brother 68       throat cancer  . Heart disease Neg Hx    Social History    Socioeconomic History  . Marital status: Married    Spouse name: Not on file  . Number of children: Not on file  . Years of education: Not on file  . Highest education level: Not on file  Occupational History  . Occupation: unemployed    Comment: applying for disability  Social Needs  . Financial resource strain: Not on file  . Food insecurity:    Worry: Not on file    Inability: Not on file  . Transportation needs:    Medical: Not on file    Non-medical: Not on file  Tobacco Use  . Smoking status: Former Smoker    Packs/day: 0.25    Years: 3.00    Pack years: 0.75    Last attempt to quit: 11/30/1998    Years since quitting: 20.1  . Smokeless tobacco: Never Used  Substance and Sexual Activity  . Alcohol use: No    Alcohol/week: 1.0 standard drinks    Types: 1 Standard drinks or equivalent per week  . Drug use: No  . Sexual activity: Yes    Birth control/protection: None  Lifestyle  . Physical activity:    Days per week: Not on file    Minutes per session: Not on file  . Stress: Not on file  Relationships  . Social connections:  Talks on phone: Not on file    Gets together: Not on file    Attends religious service: Not on file    Active member of club or organization: Not on file    Attends meetings of clubs or organizations: Not on file    Relationship status: Not on file  Other Topics Concern  . Not on file  Social History Narrative  . Not on file   Past Surgical History:  Procedure Laterality Date  . LEFT HEART CATHETERIZATION WITH CORONARY ANGIOGRAM N/A 06/14/2014   Procedure: LEFT HEART CATHETERIZATION WITH CORONARY ANGIOGRAM;  Surgeon: Corky Crafts, MD;  Location: [facility] CATH LAB;  Service: Cardiovascular;  Laterality: N/A;  . TONSILLECTOMY     Past Medical History:  Diagnosis Date  . Barrett's esophagus   . CAD (coronary artery disease)    a. NSTEMI 05/2014 - occluded RCA dominant proximal s/p asp-thrombectomy/DES to RCA, minimal LAD/LCx, EF 50%  by cath, 65-70% by echo.  . Depression   . Diabetes type 2, uncontrolled (HCC)   . Former tobacco use   . GERD (gastroesophageal reflux disease)   . Hemorrhoids    hx of  . Hypercholesterolemia   . Hypertension   . MI (myocardial infarction) (HCC)   . Peripheral neuropathy    BP (!) 145/86 (BP Location: Right Arm, Patient Position: Sitting, Cuff Size: Large)   Pulse 83   Resp 14   Ht 5\' 9"  (1.753 m)   Wt 249 lb (112.9 kg)   SpO2 96%   BMI 36.77 kg/m   Opioid Risk Score:   Fall Risk Score:  `1  Depression screen PHQ 2/9  Depression screen [facility] 2/9 09/12/2018 06/10/2018 04/12/2018 03/11/2018 10/01/2017 09/29/2017 03/08/2017  Decreased Interest 0 0 0 0 0 0 0  Down, Depressed, Hopeless 0 0 0 0 0 0 0  PHQ - 2 Score 0 0 0 0 0 0 0  Altered sleeping - - - - - - -  Tired, decreased energy - - - - - - -  Change in appetite - - - - - - -  Feeling bad or failure about yourself  - - - - - - -  Trouble concentrating - - - - - - -  Moving slowly or fidgety/restless - - - - - - -  Suicidal thoughts - - - - - - -  PHQ-9 Score - - - - - - -  Difficult doing work/chores - - - - - - -  Some recent data might be hidden    Review of Systems  Constitutional: Negative.   HENT: Negative.   Eyes: Negative.   Respiratory: Negative.   Cardiovascular: Negative.   Gastrointestinal: Negative.   Endocrine: Negative.   Genitourinary: Negative.   Musculoskeletal: Positive for arthralgias.  Skin: Negative.   Allergic/Immunologic: Negative.   Neurological: Positive for weakness and numbness.       Tingling  Hematological: Negative.   Psychiatric/Behavioral: Negative.   All other systems reviewed and are negative.      Objective:   Physical Exam Vitals signs and nursing note reviewed.  Constitutional:      Appearance: Normal appearance.  Neck:     Musculoskeletal: Normal range of motion and neck supple.  Cardiovascular:     Rate and Rhythm: Normal rate and regular rhythm.     Pulses:  Normal pulses.     Heart sounds: Normal heart sounds.  Pulmonary:     Effort: Pulmonary effort is normal.  Breath sounds: Normal breath sounds.  Musculoskeletal:     Comments: Normal Muscle Bulk and Muscle Testing Reveals:  Upper Extremities:Full  ROM and Muscle Strength 5/5 Lumbar Paraspinal Tenderness: L-4-L-5 Lower Extremities: Decreased ROM and Muscle Strength 5/5 Bilateral Lower Extremities Flexion Produces Pain into his Bilateral Lower Extremities and bilateral ankles Arises from table with ease Narrow Based Gait   Skin:    General: Skin is warm and dry.  Neurological:     Mental Status: He is alert and oriented to person, place, and time.  Psychiatric:        Mood and Affect: Mood normal.        Behavior: Behavior normal.           Assessment & Plan:  1.Bilateral hip pain. 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 Ortho Following: Dr. Thurston Hole regarding right hip placement.02/03/2019 We will continue with current medication regime.Refilled: Hydrocodone 7.5/325 mg One tablet every 6 hours as needed #120. Second script e-scribe for the following month. We will continue the opioid monitoring program, this consists of regular clinic visits, examinations, urine drug screen, pill counts as well as use of [place] Controlled Substance Reporting System.  2. Cervicalgia: Ortho Following: No complaints today. Continue with HEP as tolerated. Continue to Monitor. 02/03/2019 3. Type II DM: Polyneuropathy:Continuecurrent medication regime withGabapentin: 02/03/2019 4. Lumbar Radiculitis: Continuecurrent medication regimen withGabapentin. 02/03/2019. 5. Bilateral Ankle Joint Pain: Continue current medication regime.   20 minutes of face to face patient care time was spent during this visit. All questions were encouraged and answered.  F/U in 2 months

## 2019-02-06 ENCOUNTER — Telehealth: Payer: Self-pay | Admitting: *Deleted

## 2019-02-06 ENCOUNTER — Ambulatory Visit: Payer: Medicare HMO | Admitting: Neurology

## 2019-02-06 ENCOUNTER — Encounter: Payer: Self-pay | Admitting: Neurology

## 2019-02-06 NOTE — Telephone Encounter (Signed)
No showed new patient appointment. 

## 2019-02-21 ENCOUNTER — Encounter (HOSPITAL_COMMUNITY): Payer: Self-pay | Admitting: *Deleted

## 2019-02-21 ENCOUNTER — Emergency Department (HOSPITAL_COMMUNITY): Payer: Medicare HMO

## 2019-02-21 ENCOUNTER — Inpatient Hospital Stay (HOSPITAL_COMMUNITY)
Admission: EM | Admit: 2019-02-21 | Discharge: 2019-02-25 | DRG: 638 | Disposition: A | Discharge status: Home / Self Care | Payer: Medicare HMO | Attending: Family Medicine | Admitting: Family Medicine

## 2019-02-21 ENCOUNTER — Other Ambulatory Visit: Payer: Self-pay

## 2019-02-21 DIAGNOSIS — R109 Unspecified abdominal pain: Secondary | ICD-10-CM | POA: Diagnosis not present

## 2019-02-21 DIAGNOSIS — Z87891 Personal history of nicotine dependence: Secondary | ICD-10-CM | POA: Diagnosis not present

## 2019-02-21 DIAGNOSIS — N189 Chronic kidney disease, unspecified: Secondary | ICD-10-CM | POA: Diagnosis not present

## 2019-02-21 DIAGNOSIS — E1122 Type 2 diabetes mellitus with diabetic chronic kidney disease: Secondary | ICD-10-CM | POA: Diagnosis present

## 2019-02-21 DIAGNOSIS — I2510 Atherosclerotic heart disease of native coronary artery without angina pectoris: Secondary | ICD-10-CM | POA: Diagnosis not present

## 2019-02-21 DIAGNOSIS — I129 Hypertensive chronic kidney disease with stage 1 through stage 4 chronic kidney disease, or unspecified chronic kidney disease: Secondary | ICD-10-CM | POA: Diagnosis not present

## 2019-02-21 DIAGNOSIS — E1110 Type 2 diabetes mellitus with ketoacidosis without coma: Principal | ICD-10-CM | POA: Diagnosis present

## 2019-02-21 DIAGNOSIS — I252 Old myocardial infarction: Secondary | ICD-10-CM

## 2019-02-21 DIAGNOSIS — G63 Polyneuropathy in diseases classified elsewhere: Secondary | ICD-10-CM

## 2019-02-21 DIAGNOSIS — R7989 Other specified abnormal findings of blood chemistry: Secondary | ICD-10-CM | POA: Diagnosis not present

## 2019-02-21 DIAGNOSIS — G629 Polyneuropathy, unspecified: Secondary | ICD-10-CM

## 2019-02-21 DIAGNOSIS — R0789 Other chest pain: Secondary | ICD-10-CM

## 2019-02-21 DIAGNOSIS — R000 Tachycardia, unspecified: Secondary | ICD-10-CM | POA: Diagnosis not present

## 2019-02-21 DIAGNOSIS — Z794 Long term (current) use of insulin: Secondary | ICD-10-CM

## 2019-02-21 DIAGNOSIS — N179 Acute kidney failure, unspecified: Secondary | ICD-10-CM | POA: Diagnosis not present

## 2019-02-21 DIAGNOSIS — K2270 Barrett's esophagus without dysplasia: Secondary | ICD-10-CM | POA: Diagnosis present

## 2019-02-21 DIAGNOSIS — E1140 Type 2 diabetes mellitus with diabetic neuropathy, unspecified: Secondary | ICD-10-CM | POA: Diagnosis not present

## 2019-02-21 DIAGNOSIS — R911 Solitary pulmonary nodule: Secondary | ICD-10-CM | POA: Diagnosis present

## 2019-02-21 DIAGNOSIS — R079 Chest pain, unspecified: Secondary | ICD-10-CM | POA: Diagnosis not present

## 2019-02-21 DIAGNOSIS — E785 Hyperlipidemia, unspecified: Secondary | ICD-10-CM | POA: Diagnosis not present

## 2019-02-21 DIAGNOSIS — K219 Gastro-esophageal reflux disease without esophagitis: Secondary | ICD-10-CM | POA: Diagnosis not present

## 2019-02-21 DIAGNOSIS — R0602 Shortness of breath: Secondary | ICD-10-CM | POA: Diagnosis not present

## 2019-02-21 LAB — URINALYSIS, ROUTINE W REFLEX MICROSCOPIC
Bacteria, UA: NONE SEEN
Bilirubin Urine: NEGATIVE
Glucose, UA: >=500 mg/dL — AB
Hgb urine dipstick: NEGATIVE
Ketones, ur: 80 mg/dL — AB
Leukocytes,Ua: NEGATIVE
Nitrite: NEGATIVE
Protein, ur: NEGATIVE mg/dL
SPECIFIC GRAVITY, URINE: 1.023 (ref 1.005–1.030)
pH: 5 (ref 5.0–8.0)

## 2019-02-21 LAB — CBC WITH DIFFERENTIAL/PLATELET
Abs Immature Granulocytes: 0.02 10*3/uL (ref 0.00–0.07)
Basophils Absolute: 0 10*3/uL (ref 0.0–0.1)
Basophils Relative: 0 %
Eosinophils Absolute: 0.1 10*3/uL (ref 0.0–0.5)
Eosinophils Relative: 1 %
HCT: 41.1 % (ref 39.0–52.0)
Hemoglobin: 12.7 g/dL — ABNORMAL LOW (ref 13.0–17.0)
Immature Granulocytes: 0 %
LYMPHS ABS: 1.4 10*3/uL (ref 0.7–4.0)
Lymphocytes Relative: 19 %
MCH: 28.1 pg (ref 26.0–34.0)
MCHC: 30.9 g/dL (ref 30.0–36.0)
MCV: 90.9 fL (ref 80.0–100.0)
Monocytes Absolute: 0.5 10*3/uL (ref 0.1–1.0)
Monocytes Relative: 7 %
Neutro Abs: 5.4 10*3/uL (ref 1.7–7.7)
Neutrophils Relative %: 73 %
Platelets: 321 10*3/uL (ref 150–400)
RBC: 4.52 MIL/uL (ref 4.22–5.81)
RDW: 13.8 % (ref 11.5–15.5)
WBC: 7.4 10*3/uL (ref 4.0–10.5)
nRBC: 0 % (ref 0.0–0.2)

## 2019-02-21 LAB — BLOOD GAS, VENOUS
Acid-base deficit: 11.8 mmol/L — ABNORMAL HIGH (ref 0.0–2.0)
Bicarbonate: 13 mmol/L — ABNORMAL LOW (ref 20.0–28.0)
O2 Saturation: 74.5 %
PO2 VEN: 42.8 mmHg (ref 32.0–45.0)
Patient temperature: 98.2
pCO2, Ven: 27 mmHg — ABNORMAL LOW (ref 44.0–60.0)
pH, Ven: 7.302 (ref 7.250–7.430)

## 2019-02-21 LAB — COMPREHENSIVE METABOLIC PANEL
ALT: 17 U/L (ref 0–44)
AST: 23 U/L (ref 15–41)
Albumin: 4 g/dL (ref 3.5–5.0)
Alkaline Phosphatase: 135 U/L — ABNORMAL HIGH (ref 38–126)
Anion gap: 18 — ABNORMAL HIGH (ref 5–15)
BUN: 26 mg/dL — AB (ref 6–20)
CO2: 13 mmol/L — AB (ref 22–32)
Calcium: 8.6 mg/dL — ABNORMAL LOW (ref 8.9–10.3)
Chloride: 98 mmol/L (ref 98–111)
Creatinine, Ser: 1.75 mg/dL — ABNORMAL HIGH (ref 0.61–1.24)
GFR calc Af Amer: 48 mL/min — ABNORMAL LOW (ref >60)
GFR calc non Af Amer: 41 mL/min — ABNORMAL LOW (ref >60)
Glucose, Bld: 815 mg/dL (ref 70–99)
Potassium: 5.4 mmol/L — ABNORMAL HIGH (ref 3.5–5.1)
Sodium: 129 mmol/L — ABNORMAL LOW (ref 135–145)
Total Bilirubin: 1.4 mg/dL — ABNORMAL HIGH (ref 0.3–1.2)
Total Protein: 6.9 g/dL (ref 6.5–8.1)

## 2019-02-21 LAB — CBG MONITORING, ED: Glucose-Capillary: >600 mg/dL (ref 70–99)

## 2019-02-21 MED ORDER — SODIUM CHLORIDE 0.9 % IV BOLUS
1000.0000 mL | Freq: Once | INTRAVENOUS | Status: AC
  Administered 2019-02-21: 1000 mL via INTRAVENOUS

## 2019-02-21 MED ORDER — DEXTROSE-NACL 5-0.45 % IV SOLN: INTRAVENOUS | Status: DC

## 2019-02-21 MED ORDER — ONDANSETRON HCL 4 MG/2ML IJ SOLN
4.0000 mg | Freq: Once | INTRAMUSCULAR | Status: AC
  Administered 2019-02-21: 4 mg via INTRAVENOUS
  Filled 2019-02-21: qty 2

## 2019-02-21 MED ORDER — INSULIN REGULAR(HUMAN) IN NACL 100-0.9 UT/100ML-% IV SOLN
INTRAVENOUS | Status: DC
  Administered 2019-02-22: 5.4 [IU]/h via INTRAVENOUS
  Filled 2019-02-21: qty 100

## 2019-02-21 NOTE — ED Notes (Signed)
Date and time results received: 02/21/19 2325  Test: Glucose  Critical Value: 815mg /dL   Name of Provider Notified: Dr. Shela Commons. Long   Orders Received? Or Actions Taken?: Will continue to monitor patient and await new orders.

## 2019-02-21 NOTE — ED Triage Notes (Signed)
Pt stated "My sugar has been high all day.  My meter wouldn't read it the last time.  I took lantus 45 novolog 25 units around 1100-1130 and another novolog 20 units @ 1600.  I haven't taken my other lantus.  Ate a bowl of soup around 1300."

## 2019-02-21 NOTE — H&P (Signed)
History and Physical   [name] [name] GNF:[id number] DOB: January 23, 1956 DOA: 02/21/2019  Referring MD/NP/PA: Dr. Jacqulyn Bath  PCP: Sherren Mocha, MD   Outpatient Specialists: None  Patient coming from: Home  Chief Complaint: Nausea with shortness of breath  HPI: [name] [name] is a 63 y.o. male with medical history significant of insulin-dependent diabetes, hypertension, coronary artery disease, acute kidney injury, GERD, depression, Barrett's esophagus who presented with some shortness of breath and hyperglycemia.  Patient is on insulin and has been compliant with his insulin.  He started having symptoms this morning of nausea and some shortness of breath.  He later found his sugar to be markedly elevated.  Patient decided to come to the emergency room for evaluation.  He was found to have a blood sugar of more than 800 elevated anion gap as well as ketonuria.  Patient is being admitted with diabetic ketoacidosis.  He denied any abdominal pain or active diarrhea.  Denied any vomiting only nausea.  Patient did have diarrhea yesterday which was short lived.  He has had previous DKA's..  ED Course: Temperature is 98 to blood pressure 118/67, pulse of 107 respiratory rate of 30 with oxygen sat 97% room air.  pH of 7.302 sodium 129 potassium 5.4 chloride 98 CO2 13 glucose 815 with BUN 26 creatinine 1.75 calcium 8.5 anion gap of 18 and alkaline phosphatase 135.  LFTs are reasonably within normal.  CBC appears to be reasonably within normal.  Urinalysis shows glucosuria with ketones of 80.  Chest x-ray is negative.  Patient diagnosed with DKA and is being admitted for treatment.  Review of Systems: As per HPI otherwise 10 point review of systems negative.    Past Medical History:  Diagnosis Date  . Barrett's esophagus   . CAD (coronary artery disease)    a. NSTEMI 05/2014 - occluded RCA dominant proximal s/p asp-thrombectomy/DES to RCA, minimal LAD/LCx, EF 50% by cath, 65-70% by echo.  . Depression   . Diabetes  type 2, uncontrolled (HCC)   . Former tobacco use   . GERD (gastroesophageal reflux disease)   . Hemorrhoids    hx of  . Hypercholesterolemia   . Hypertension   . MI (myocardial infarction) (HCC)   . Peripheral neuropathy     Past Surgical History:  Procedure Laterality Date  . LEFT HEART CATHETERIZATION WITH CORONARY ANGIOGRAM N/A 06/14/2014   Procedure: LEFT HEART CATHETERIZATION WITH CORONARY ANGIOGRAM;  Surgeon: Corky Crafts, MD;  Location: [facility] CATH LAB;  Service: Cardiovascular;  Laterality: N/A;  . TONSILLECTOMY       reports that he quit smoking about 20 years ago. He has a 0.75 pack-year smoking history. He has never used smokeless tobacco. He reports that he does not drink alcohol or use drugs.  No Known Allergies  Family History  Problem Relation Age of Onset  . Hypertension Mother   . Alzheimer's disease Mother   . Alcohol abuse Father   . Cancer Father 45       "Throat"  . Diabetes type II Brother   . Cancer Brother 68       throat cancer  . Heart disease Neg Hx      Prior to Admission medications   Medication Sig Start Date End Date Taking? Authorizing Provider  EQ ASPIRIN ADULT LOW DOSE 81 MG EC tablet TAKE ONE TABLET BY MOUTH ONCE DAILY 07/02/15   Corky Crafts, MD  gabapentin (NEURONTIN) 100 MG capsule TAKE 2 CAPSULES BY MOUTH EVERY MORNING  AND 2 CAPSULES BY MOUTH  AT BEDTIME Patient taking differently: Take 200 mg by mouth 2 (two) times daily. TAKE 2 CAPSULES BY MOUTH EVERY MORNING AND 2 CAPSULES BY MOUTH  AT BEDTIME 12/07/18   Jones Bales, NP  HYDROcodone-acetaminophen (NORCO) 7.5-325 MG tablet Take 1 tablet by mouth every 6 (six) hours as needed for moderate pain. 02/03/19   Jones Bales, NP  insulin aspart (NOVOLOG) 100 UNIT/ML injection Inject 0-15 Units into the skin 3 (three) times daily with meals for 30 days. For glucose 121 to 150 use 2 units, for 151 to 200 use 4 units, for 201-250 use 7 units, for 251 to 300 use 9 units, for 301 to  350 use 12 units, for 351 or greater use 15 units. 01/17/19 02/16/19  Arrien, York Ram, MD  insulin detemir (LEVEMIR) 100 UNIT/ML injection Inject 0.2 mLs (20 Units total) into the skin 2 (two) times daily for 30 days. 01/17/19 02/16/19  Arrien, York Ram, MD  lisinopril (PRINIVIL,ZESTRIL) 2.5 MG tablet TAKE 1 TABLET(2.5 MG) BY MOUTH DAILY Patient taking differently: Take 2.5 mg by mouth daily.  12/06/18   Sherren Mocha, MD  methocarbamol (ROBAXIN) 500 MG tablet Take 500 mg by mouth 2 (two) times daily as needed for muscle spasms. 12/29/18   [provider]  metoprolol tartrate (LOPRESSOR) 25 MG tablet TAKE 1/2 TABLET(12.5 MG) BY MOUTH TWICE DAILY Patient taking differently: Take 12.5 mg by mouth 2 (two) times daily.  09/12/18   Sherren Mocha, MD  nitroGLYCERIN (NITROSTAT) 0.4 MG SL tablet Place 1 tablet (0.4 mg total) under the tongue every 5 (five) minutes as needed for chest pain (up to 3 doses). 11/10/16   Corky Crafts, MD  pantoprazole (PROTONIX) 40 MG tablet TAKE 1 TABLET BY MOUTH EVERY DAY 12/29/18   Sherren Mocha, MD  rosuvastatin (CRESTOR) 40 MG tablet TAKE 1 TABLET(40 MG) BY MOUTH DAILY Patient taking differently: Take 40 mg by mouth daily.  12/29/18   Sherren Mocha, MD    Physical Exam: Vitals:   02/21/19 2110 02/21/19 2209 02/21/19 2230  BP: 136/76 121/65 118/67  Pulse: (!) 107 (!) 101 (!) 107  Resp: (!) 30 19 (!) 23  Temp: 98.2 F (36.8 C)    TempSrc: Oral    SpO2: 98% 100% 97%  Weight: 108.9 kg    Height: 5\' 9"  (1.753 m)        Constitutional: NAD, calm, comfortable Vitals:   02/21/19 2110 02/21/19 2209 02/21/19 2230  BP: 136/76 121/65 118/67  Pulse: (!) 107 (!) 101 (!) 107  Resp: (!) 30 19 (!) 23  Temp: 98.2 F (36.8 C)    TempSrc: Oral    SpO2: 98% 100% 97%  Weight: 108.9 kg    Height: 5\' 9"  (1.753 m)     Eyes: PERRL, lids and conjunctivae normal ENMT: Mucous membranes are dry. Posterior pharynx clear of any exudate or lesions.Normal dentition.   Neck: normal, supple, no masses, no thyromegaly Respiratory: clear to auscultation bilaterally, no wheezing, no crackles. Normal respiratory effort. No accessory muscle use.  Cardiovascular: Sinus tachycardia no murmurs / rubs / gallops. No extremity edema. 2+ pedal pulses. No carotid bruits.  Abdomen: no tenderness, no masses palpated. No hepatosplenomegaly. Bowel sounds positive.  Musculoskeletal: no clubbing / cyanosis. No joint deformity upper and lower extremities. Good ROM, no contractures. Normal muscle tone.  Skin: no rashes, lesions, ulcers. No induration Neurologic: CN 2-12 grossly intact. Sensation intact, DTR normal. Strength 5/5  in all 4.  Psychiatric: Normal judgment and insight. Alert and oriented x 3. Normal mood.     Labs on Admission: I have personally reviewed following labs and imaging studies  CBC: Recent Labs  Lab 02/21/19 2215  WBC 7.4  NEUTROABS 5.4  HGB 12.7*  HCT 41.1  MCV 90.9  PLT 321   Basic Metabolic Panel: Recent Labs  Lab 02/21/19 2215  NA 129*  K 5.4*  CL 98  CO2 13*  GLUCOSE 815*  BUN 26*  CREATININE 1.75*  CALCIUM 8.6*   GFR: Estimated Creatinine Clearance: 54.6 mL/min (A) (by C-G formula based on SCr of 1.75 mg/dL (H)). Liver Function Tests: Recent Labs  Lab 02/21/19 2215  AST 23  ALT 17  ALKPHOS 135*  BILITOT 1.4*  PROT 6.9  ALBUMIN 4.0   No results for input(s): LIPASE, AMYLASE in the last 168 hours. No results for input(s): AMMONIA in the last 168 hours. Coagulation Profile: No results for input(s): INR, PROTIME in the last 168 hours. Cardiac Enzymes: No results for input(s): CKTOTAL, CKMB, CKMBINDEX, TROPONINI in the last 168 hours. BNP (last 3 results) No results for input(s): PROBNP in the last 8760 hours. HbA1C: No results for input(s): HGBA1C in the last 72 hours. CBG: Recent Labs  Lab 02/21/19 2109  GLUCAP >600*   Lipid Profile: No results for input(s): CHOL, HDL, LDLCALC, TRIG, CHOLHDL, LDLDIRECT in the  last 72 hours. Thyroid Function Tests: No results for input(s): TSH, T4TOTAL, FREET4, T3FREE, THYROIDAB in the last 72 hours. Anemia Panel: No results for input(s): VITAMINB12, FOLATE, FERRITIN, TIBC, IRON, RETICCTPCT in the last 72 hours. Urine analysis:    Component Value Date/Time   COLORURINE COLORLESS (A) 02/21/2019 2143   APPEARANCEUR CLEAR 02/21/2019 2143   LABSPEC 1.023 02/21/2019 2143   PHURINE 5.0 02/21/2019 2143   GLUCOSEU >=500 (A) 02/21/2019 2143   HGBUR NEGATIVE 02/21/2019 2143   BILIRUBINUR NEGATIVE 02/21/2019 2143   BILIRUBINUR negative 03/11/2018 1613   KETONESUR 80 (A) 02/21/2019 2143   PROTEINUR NEGATIVE 02/21/2019 2143   UROBILINOGEN 0.2 03/11/2018 1613   UROBILINOGEN 0.2 06/14/2014 0809   NITRITE NEGATIVE 02/21/2019 2143   LEUKOCYTESUR NEGATIVE 02/21/2019 2143   Sepsis Labs: @LABRCNTIP (procalcitonin:4,lacticidven:4) )No results found for this or any previous visit (from the past 240 hour(s)).   Radiological Exams on Admission: Dg Chest Portable 1 View  Result Date: 02/21/2019 CLINICAL DATA:  Chest pain and dyspnea for 24 hours EXAM: PORTABLE CHEST 1 VIEW COMPARISON:  01/28/2019 FINDINGS: The heart size and mediastinal contours are within normal limits. Mild pulmonary hyperinflation, upper lobe predominant. Pulmonary consolidation or edema. The visualized skeletal structures are unremarkable. IMPRESSION: Mild upper lobe predominant hyperinflation of lungs. No active pulmonary disease. Electronically Signed   By: Tollie Eth M.D.   On: 02/21/2019 22:12    EKG: Independently reviewed.  It shows sinus tachycardia with a rate of 106.  No significant ST changes.  Assessment/Plan Principal Problem:   DKA (diabetic ketoacidoses) (HCC) Active Problems:   GERD (gastroesophageal reflux disease)   Peripheral neuropathy   CAD (coronary artery disease)   Hyperlipidemia   AKI (acute kidney injury) (HCC)     #1 diabetic ketoacidosis: Trigger is unknown.  Patient  has been on Levemir and sliding scale insulin at home.  At this point he will be admitted to the stepdown unit.  IV insulin initiated.  Titrate according to the DKA protocol.  Once patient's gap has closed we will transition him to subcutaneous insulin and adjust the  dose.  #2 diabetic neuropathy: Continue with his pain management from home.  #3 coronary artery disease: Stable.  No chest pain and no evidence of coronary artery decompensation.  #4 hyperlipidemia: Continue with statin.  #5 acute kidney injury: Aggressively hydrate.  Monitor renal function  #6 GERD: Continue with PPIs   DVT prophylaxis: Lovenox Code Status: Full code Family Communication: No family at bedside Disposition Plan: Home Consults called: None Admission status: Inpatient  Severity of Illness: The appropriate patient status for this patient is INPATIENT. Inpatient status is judged to be reasonable and necessary in order to provide the required intensity of service to ensure the patient's safety. The patient's presenting symptoms, physical exam findings, and initial radiographic and laboratory data in the context of their chronic comorbidities is felt to place them at high risk for further clinical deterioration. Furthermore, it is not anticipated that the patient will be medically stable for discharge from the hospital within 2 midnights of admission. The following factors support the patient status of inpatient.   " The patient's presenting symptoms include nausea with elevated blood sugar. " The worrisome physical exam findings include dehydration. " The initial radiographic and laboratory data are worrisome because of glucose of more than 800. " The chronic co-morbidities include insulin-dependent diabetes.   * I certify that at the point of admission it is my clinical judgment that the patient will require inpatient hospital care spanning beyond 2 midnights from the point of admission due to high intensity of  service, high risk for further deterioration and high frequency of surveillance required.Lonia Blood MD [facility] Pager [phone]  If 7PM-7AM, please contact night-coverage [email or web address] Password [facility]  02/21/2019, 11:38 PM

## 2019-02-21 NOTE — ED Provider Notes (Signed)
Emergency Department Provider Note   I have reviewed the triage vital signs and the nursing notes.   HISTORY  Chief Complaint Hyperglycemia; Shortness of Breath; and Nausea   HPI [name] [name] is a 63 y.o. male with PMH of CAD, HTN, HLD, and IDDM presents to the emergency department for evaluation of shortness of breath with hyperglycemia.  Symptoms began today.  Patient has had some associated runny nose with mild sore throat.  No cough.  No chest pain.  No fevers.  Patient has been compliant with his insulin has been following his elevated blood sugars today.  Symptoms are worsening and blood sugars have increased the point where he is unable to read them on his home monitor so he came to the emergency department.  Denies any abdominal pain, nausea, vomiting.  He has had some diarrhea which is nonbloody.  Notes some worsening right hip pain just typical of his hyperglycemia episodes.  Past Medical History:  Diagnosis Date  . Barrett's esophagus   . CAD (coronary artery disease)    a. NSTEMI 05/2014 - occluded RCA dominant proximal s/p asp-thrombectomy/DES to RCA, minimal LAD/LCx, EF 50% by cath, 65-70% by echo.  . Depression   . Diabetes type 2, uncontrolled (HCC)   . Former tobacco use   . GERD (gastroesophageal reflux disease)   . Hemorrhoids    hx of  . Hypercholesterolemia   . Hypertension   . MI (myocardial infarction) (HCC)   . Peripheral neuropathy     Patient Active Problem List   Diagnosis Date Noted  . Cerebral thrombosis with cerebral infarction 01/15/2019  . Subarachnoid hemorrhage 01/15/2019  . Stroke-like symptoms 01/14/2019  . Hypoglycemia due to insulin 01/14/2019  . Noncompliance with medications   . Homeless   . DKA (diabetic ketoacidosis) (HCC) 11/27/2018  . Chest pain, atypical 11/19/2018  . Dizziness 11/19/2018  . Bilateral low back pain without sciatica 04/12/2018  . Vitamin D deficiency 04/07/2018  . Pill dysphagia 04/07/2018  . Enlarged  thyroid gland 04/07/2018  . Labile blood glucose 04/07/2018  . Brittle diabetes mellitus (HCC) 04/07/2018  . AKI (acute kidney injury) (HCC) 09/22/2017  . Chronic pain syndrome 08/18/2016  . Diabetic ketoacidosis (HCC) 04/12/2016  . Hip pain, bilateral 08/23/2015  . Chronic neck pain 08/23/2015  . DKA (diabetic ketoacidoses) (HCC) 05/30/2015  . Acute renal failure (HCC) 05/30/2015  . Restless legs 08/31/2014  . Uncontrolled diabetes mellitus type 2 with peripheral artery disease (HCC) 08/10/2014  . Old myocardial infarction 08/10/2014  . CAD (coronary artery disease) 06/18/2014  . Hyperlipidemia 06/18/2014  . NSTEMI (non-ST elevated myocardial infarction) (HCC) 06/14/2014  . N&V (nausea and vomiting) 07/21/2013  . Chronic pain in left shoulder 04/06/2013  . Peripheral neuropathy 02/28/2012  . Depression 12/25/2011  . GERD (gastroesophageal reflux disease) 12/25/2011  . DM (diabetes mellitus), type 2, uncontrolled (HCC) 11/18/2011    Past Surgical History:  Procedure Laterality Date  . LEFT HEART CATHETERIZATION WITH CORONARY ANGIOGRAM N/A 06/14/2014   Procedure: LEFT HEART CATHETERIZATION WITH CORONARY ANGIOGRAM;  Surgeon: Corky Crafts, MD;  Location: [facility] CATH LAB;  Service: Cardiovascular;  Laterality: N/A;  . TONSILLECTOMY     Allergies Patient has no known allergies.  Family History  Problem Relation Age of Onset  . Hypertension Mother   . Alzheimer's disease Mother   . Alcohol abuse Father   . Cancer Father 5       "Throat"  . Diabetes type II Brother   . Cancer Brother  68       throat cancer  . Heart disease Neg Hx     Social History Social History   Tobacco Use  . Smoking status: Former Smoker    Packs/day: 0.25    Years: 3.00    Pack years: 0.75    Last attempt to quit: 11/30/1998    Years since quitting: 20.2  . Smokeless tobacco: Never Used  Substance Use Topics  . Alcohol use: No    Alcohol/week: 1.0 standard drinks    Types: 1 Standard drinks  or equivalent per week  . Drug use: No    Review of Systems  Constitutional: No fever/chills Eyes: No visual changes. ENT: Positive sore throat and congestion.  Cardiovascular: Denies chest pain. Respiratory: Positive shortness of breath. Gastrointestinal: No abdominal pain.  No nausea, no vomiting. Positive diarrhea.  No constipation. Genitourinary: Negative for dysuria. Musculoskeletal: Negative for back pain. Skin: Negative for rash. Neurological: Negative for headaches, focal weakness or numbness.  10-point ROS otherwise negative.  ____________________________________________   PHYSICAL EXAM:  VITAL SIGNS: ED Triage Vitals  Enc Vitals Group     BP 02/21/19 2110 136/76     Pulse Rate 02/21/19 2110 (!) 107     Resp 02/21/19 2110 (!) 30     Temp 02/21/19 2110 98.2 F (36.8 C)     Temp Source 02/21/19 2110 Oral     SpO2 02/21/19 2110 98 %     Weight 02/21/19 2110 240 lb (108.9 kg)     Height 02/21/19 2110 5\' 9"  (1.753 m)     Pain Score 02/21/19 2114 3   Constitutional: Alert and oriented. Well appearing and in no acute distress. Eyes: Conjunctivae are normal. Head: Atraumatic. Nose: No congestion/rhinnorhea. Mouth/Throat: Mucous membranes are dry.  Neck: No stridor.  Cardiovascular: Tachycardia. Good peripheral circulation. Grossly normal heart sounds.   Respiratory: Increased respiratory effort.  No retractions. Lungs CTAB. Gastrointestinal: Soft and nontender. No distention.  Musculoskeletal: No lower extremity tenderness nor edema. No gross deformities of extremities. Neurologic:  Normal speech and language. No gross focal neurologic deficits are appreciated.  Skin:  Skin is warm, dry and intact. No rash noted.   ____________________________________________   LABS (all labs ordered are listed, but only abnormal results are displayed)  Labs Reviewed  URINALYSIS, ROUTINE W REFLEX MICROSCOPIC - Abnormal; Notable for the following components:      Result  Value   Color, Urine COLORLESS (*)    Glucose, UA >=500 (*)    Ketones, ur 80 (*)    All other components within normal limits  COMPREHENSIVE METABOLIC PANEL - Abnormal; Notable for the following components:   Sodium 129 (*)    Potassium 5.4 (*)    CO2 13 (*)    Glucose, Bld 815 (*)    BUN 26 (*)    Creatinine, Ser 1.75 (*)    Calcium 8.6 (*)    Alkaline Phosphatase 135 (*)    Total Bilirubin 1.4 (*)    GFR calc non Af Amer 41 (*)    GFR calc Af Amer 48 (*)    Anion gap 18 (*)    All other components within normal limits  CBC WITH DIFFERENTIAL/PLATELET - Abnormal; Notable for the following components:   Hemoglobin 12.7 (*)    All other components within normal limits  BLOOD GAS, VENOUS - Abnormal; Notable for the following components:   pCO2, Ven 27.0 (*)    Bicarbonate 13.0 (*)    Acid-base deficit 11.8 (*)  All other components within normal limits  CBG MONITORING, ED - Abnormal; Notable for the following components:   Glucose-Capillary >600 (*)    All other components within normal limits  CBG MONITORING, ED   ____________________________________________  EKG   EKG Interpretation  Date/Time:  Tuesday February 21 2019 21:42:14 EDT Ventricular Rate:  106 PR Interval:    QRS Duration: 75 QT Interval:  346 QTC Calculation: 460 R Axis:   37 Text Interpretation:  Sinus tachycardia No STEMI.  Confirmed by Alona Bene [phone]) on 02/21/2019 9:49:16 PM       ____________________________________________  RADIOLOGY  Dg Chest Portable 1 View  Result Date: 02/21/2019 CLINICAL DATA:  Chest pain and dyspnea for 24 hours EXAM: PORTABLE CHEST 1 VIEW COMPARISON:  01/28/2019 FINDINGS: The heart size and mediastinal contours are within normal limits. Mild pulmonary hyperinflation, upper lobe predominant. Pulmonary consolidation or edema. The visualized skeletal structures are unremarkable. IMPRESSION: Mild upper lobe predominant hyperinflation of lungs. No active pulmonary  disease. Electronically Signed   By: Tollie Eth M.D.   On: 02/21/2019 22:12    ____________________________________________   PROCEDURES  Procedure(s) performed:   Procedures  CRITICAL CARE Performed by: Maia Plan Total critical care time: 35 minutes Critical care time was exclusive of separately billable procedures and treating other patients. Critical care was necessary to treat or prevent imminent or life-threatening deterioration. Critical care was time spent personally by me on the following activities: development of treatment plan with patient and/or surrogate as well as nursing, discussions with consultants, evaluation of patient's response to treatment, examination of patient, obtaining history from patient or surrogate, ordering and performing treatments and interventions, ordering and review of laboratory studies, ordering and review of radiographic studies, pulse oximetry and re-evaluation of patient's condition.  Alona Bene, MD Emergency Medicine  ____________________________________________   INITIAL IMPRESSION / ASSESSMENT AND PLAN / ED COURSE  Pertinent labs & imaging results that were available during my care of the patient were reviewed by me and considered in my medical decision making (see chart for details).  She presents to the emergency department with shortness of breath and hyperglycemia.  Clinically concerning for DKA.  Blood sugar greater than 600 here.  Lungs are clear.  Patient with mild URI symptoms. No fever. Doubt COVID or flu.   11:27 PM  Patient with pH of 7.3.  CBG with blood sugar greater than 800, gap, potassium of 5.4.  Start the patient on insulin infusion and continue IV fluids.  Updated the patient on his results and need to stay in the hospital. CXR without infiltrate.   Discussed patient's case with Hospitlaist, Dr. Mikeal Hawthorne to request admission. Patient and family (if present) updated with plan. Care transferred to Hospitalist service.   I reviewed all nursing notes, vitals, pertinent old records, EKGs, labs, imaging (as available).  ____________________________________________  FINAL CLINICAL IMPRESSION(S) / ED DIAGNOSES  Final diagnoses:  Diabetic ketoacidosis without coma associated with type 2 diabetes mellitus (HCC)    MEDICATIONS GIVEN DURING THIS VISIT:  Medications  insulin regular, human (MYXREDLIN) 100 units/ 100 mL infusion (has no administration in time range)  dextrose 5 %-0.45 % sodium chloride infusion (has no administration in time range)  sodium chloride 0.9 % bolus 1,000 mL (1,000 mLs Intravenous New Bag/Given 02/21/19 2212)  ondansetron (ZOFRAN) injection 4 mg (4 mg Intravenous Given 02/21/19 2212)    Note:  This document was prepared using Dragon voice recognition software and may include unintentional dictation errors.  Alona Bene, MD  Emergency Medicine    Madiha Bambrick, Arlyss Repress, MD 02/21/19 2329

## 2019-02-22 LAB — BASIC METABOLIC PANEL
Anion gap: 20 — ABNORMAL HIGH (ref 5–15)
Anion gap: 9 (ref 5–15)
Anion gap: 7 (ref 5–15)
Anion gap: 8 (ref 5–15)
BUN: 26 mg/dL — ABNORMAL HIGH (ref 6–20)
BUN: 22 mg/dL — ABNORMAL HIGH (ref 6–20)
BUN: 19 mg/dL (ref 6–20)
BUN: 18 mg/dL (ref 6–20)
CO2: 8 mmol/L — ABNORMAL LOW (ref 22–32)
CO2: 15 mmol/L — ABNORMAL LOW (ref 22–32)
CO2: 17 mmol/L — ABNORMAL LOW (ref 22–32)
CO2: 18 mmol/L — ABNORMAL LOW (ref 22–32)
Calcium: 8.4 mg/dL — ABNORMAL LOW (ref 8.9–10.3)
Calcium: 8.4 mg/dL — ABNORMAL LOW (ref 8.9–10.3)
Calcium: 8.1 mg/dL — ABNORMAL LOW (ref 8.9–10.3)
Calcium: 8 mg/dL — ABNORMAL LOW (ref 8.9–10.3)
Chloride: 107 mmol/L (ref 98–111)
Chloride: 114 mmol/L — ABNORMAL HIGH (ref 98–111)
Chloride: 116 mmol/L — ABNORMAL HIGH (ref 98–111)
Chloride: 114 mmol/L — ABNORMAL HIGH (ref 98–111)
Creatinine, Ser: 1.68 mg/dL — ABNORMAL HIGH (ref 0.61–1.24)
Creatinine, Ser: 1.39 mg/dL — ABNORMAL HIGH (ref 0.61–1.24)
Creatinine, Ser: 1.09 mg/dL (ref 0.61–1.24)
Creatinine, Ser: 1.06 mg/dL (ref 0.61–1.24)
GFR calc Af Amer: 50 mL/min — ABNORMAL LOW (ref >60)
GFR calc Af Amer: >60 mL/min (ref >60)
GFR calc Af Amer: >60 mL/min (ref >60)
GFR calc Af Amer: >60 mL/min (ref >60)
GFR calc non Af Amer: >60 mL/min (ref >60)
GFR calc non Af Amer: >60 mL/min (ref >60)
GFR, EST NON AFRICAN AMERICAN: 43 mL/min — AB (ref >60)
GFR, EST NON AFRICAN AMERICAN: 55 mL/min — AB (ref >60)
GLUCOSE: 320 mg/dL — AB (ref 70–99)
Glucose, Bld: 659 mg/dL (ref 70–99)
Glucose, Bld: 102 mg/dL — ABNORMAL HIGH (ref 70–99)
Glucose, Bld: 130 mg/dL — ABNORMAL HIGH (ref 70–99)
POTASSIUM: 3.8 mmol/L (ref 3.5–5.1)
POTASSIUM: 4.1 mmol/L (ref 3.5–5.1)
Potassium: 5.1 mmol/L (ref 3.5–5.1)
Potassium: 4.4 mmol/L (ref 3.5–5.1)
SODIUM: 140 mmol/L (ref 135–145)
Sodium: 135 mmol/L (ref 135–145)
Sodium: 138 mmol/L (ref 135–145)
Sodium: 140 mmol/L (ref 135–145)

## 2019-02-22 LAB — CBC
HCT: 42 % (ref 39.0–52.0)
HEMOGLOBIN: 13.1 g/dL (ref 13.0–17.0)
MCH: 28.4 pg (ref 26.0–34.0)
MCHC: 31.2 g/dL (ref 30.0–36.0)
MCV: 91.1 fL (ref 80.0–100.0)
Platelets: 282 10*3/uL (ref 150–400)
RBC: 4.61 MIL/uL (ref 4.22–5.81)
RDW: 13.8 % (ref 11.5–15.5)
WBC: 6.9 10*3/uL (ref 4.0–10.5)
nRBC: 0 % (ref 0.0–0.2)

## 2019-02-22 LAB — GLUCOSE, CAPILLARY
GLUCOSE-CAPILLARY: 332 mg/dL — AB (ref 70–99)
GLUCOSE-CAPILLARY: 256 mg/dL — AB (ref 70–99)
GLUCOSE-CAPILLARY: 84 mg/dL (ref 70–99)
GLUCOSE-CAPILLARY: 156 mg/dL — AB (ref 70–99)
Glucose-Capillary: 546 mg/dL (ref 70–99)
Glucose-Capillary: 395 mg/dL — ABNORMAL HIGH (ref 70–99)
Glucose-Capillary: 240 mg/dL — ABNORMAL HIGH (ref 70–99)
Glucose-Capillary: 187 mg/dL — ABNORMAL HIGH (ref 70–99)
Glucose-Capillary: 117 mg/dL — ABNORMAL HIGH (ref 70–99)
Glucose-Capillary: 86 mg/dL (ref 70–99)
Glucose-Capillary: 86 mg/dL (ref 70–99)
Glucose-Capillary: 96 mg/dL (ref 70–99)
Glucose-Capillary: 149 mg/dL — ABNORMAL HIGH (ref 70–99)
Glucose-Capillary: 175 mg/dL — ABNORMAL HIGH (ref 70–99)

## 2019-02-22 LAB — CBG MONITORING, ED: Glucose-Capillary: >600 mg/dL (ref 70–99)

## 2019-02-22 LAB — MRSA PCR SCREENING: MRSA by PCR: NEGATIVE

## 2019-02-22 MED ORDER — SODIUM CHLORIDE 0.9 % IV SOLN
INTRAVENOUS | Status: AC
  Administered 2019-02-22: 02:00:00 via INTRAVENOUS

## 2019-02-22 MED ORDER — GABAPENTIN 100 MG PO CAPS
200.0000 mg | ORAL_CAPSULE | Freq: Two times a day (BID) | ORAL | Status: DC
  Administered 2019-02-22 – 2019-02-25 (×8): 200 mg via ORAL
  Filled 2019-02-22 (×8): qty 2

## 2019-02-22 MED ORDER — DEXTROSE-NACL 5-0.45 % IV SOLN
INTRAVENOUS | Status: DC
  Administered 2019-02-22 (×2): via INTRAVENOUS

## 2019-02-22 MED ORDER — ORAL CARE MOUTH RINSE
15.0000 mL | Freq: Two times a day (BID) | OROMUCOSAL | Status: DC
  Administered 2019-02-22 – 2019-02-25 (×4): 15 mL via OROMUCOSAL

## 2019-02-22 MED ORDER — HYDROCODONE-ACETAMINOPHEN 7.5-325 MG PO TABS
1.0000 | ORAL_TABLET | Freq: Four times a day (QID) | ORAL | Status: DC | PRN
  Administered 2019-02-22 – 2019-02-25 (×9): 1 via ORAL
  Filled 2019-02-22 (×9): qty 1

## 2019-02-22 MED ORDER — ROSUVASTATIN CALCIUM 20 MG PO TABS
40.0000 mg | ORAL_TABLET | Freq: Every day | ORAL | Status: DC
  Administered 2019-02-22 – 2019-02-25 (×4): 40 mg via ORAL
  Filled 2019-02-22 (×4): qty 2

## 2019-02-22 MED ORDER — INSULIN DETEMIR 100 UNIT/ML ~~LOC~~ SOLN
30.0000 [IU] | Freq: Two times a day (BID) | SUBCUTANEOUS | Status: DC
  Administered 2019-02-22 – 2019-02-24 (×5): 30 [IU] via SUBCUTANEOUS
  Filled 2019-02-22 (×7): qty 0.3

## 2019-02-22 MED ORDER — LACTATED RINGERS IV SOLN
INTRAVENOUS | Status: AC
  Administered 2019-02-22 – 2019-02-23 (×3): via INTRAVENOUS

## 2019-02-22 MED ORDER — CHLORHEXIDINE GLUCONATE CLOTH 2 % EX PADS
6.0000 | MEDICATED_PAD | Freq: Every day | CUTANEOUS | Status: DC
  Administered 2019-02-24: 6 via TOPICAL

## 2019-02-22 MED ORDER — INSULIN ASPART 100 UNIT/ML ~~LOC~~ SOLN
8.0000 [IU] | Freq: Three times a day (TID) | SUBCUTANEOUS | Status: DC
  Administered 2019-02-22 – 2019-02-23 (×3): 8 [IU] via SUBCUTANEOUS

## 2019-02-22 MED ORDER — LACTATED RINGERS IV BOLUS
2000.0000 mL | Freq: Once | INTRAVENOUS | Status: AC
  Administered 2019-02-22: 2000 mL via INTRAVENOUS

## 2019-02-22 MED ORDER — LISINOPRIL 5 MG PO TABS
2.5000 mg | ORAL_TABLET | Freq: Every day | ORAL | Status: DC
  Administered 2019-02-22 – 2019-02-23 (×2): 2.5 mg via ORAL
  Filled 2019-02-22 (×2): qty 1

## 2019-02-22 MED ORDER — METOPROLOL TARTRATE 25 MG PO TABS
12.5000 mg | ORAL_TABLET | Freq: Two times a day (BID) | ORAL | Status: DC
  Administered 2019-02-22 – 2019-02-25 (×8): 12.5 mg via ORAL
  Filled 2019-02-22 (×8): qty 1

## 2019-02-22 MED ORDER — METHOCARBAMOL 500 MG PO TABS
500.0000 mg | ORAL_TABLET | Freq: Two times a day (BID) | ORAL | Status: DC | PRN
  Administered 2019-02-22 (×2): 500 mg via ORAL
  Filled 2019-02-22 (×4): qty 1

## 2019-02-22 MED ORDER — INSULIN REGULAR(HUMAN) IN NACL 100-0.9 UT/100ML-% IV SOLN
INTRAVENOUS | Status: DC
  Administered 2019-02-22: 9 [IU]/h via INTRAVENOUS
  Filled 2019-02-22: qty 100

## 2019-02-22 MED ORDER — NITROGLYCERIN 0.4 MG SL SUBL
0.4000 mg | SUBLINGUAL_TABLET | SUBLINGUAL | Status: DC | PRN
  Administered 2019-02-23 (×2): 0.4 mg via SUBLINGUAL
  Filled 2019-02-22 (×2): qty 1

## 2019-02-22 MED ORDER — PANTOPRAZOLE SODIUM 40 MG PO TBEC
40.0000 mg | DELAYED_RELEASE_TABLET | Freq: Every day | ORAL | Status: DC
  Administered 2019-02-22 – 2019-02-25 (×4): 40 mg via ORAL
  Filled 2019-02-22 (×4): qty 1

## 2019-02-22 MED ORDER — INSULIN ASPART 100 UNIT/ML ~~LOC~~ SOLN
0.0000 [IU] | Freq: Three times a day (TID) | SUBCUTANEOUS | Status: DC
  Administered 2019-02-22 – 2019-02-23 (×2): 3 [IU] via SUBCUTANEOUS
  Administered 2019-02-24: 5 [IU] via SUBCUTANEOUS
  Administered 2019-02-24: 3 [IU] via SUBCUTANEOUS

## 2019-02-22 MED ORDER — ENOXAPARIN SODIUM 40 MG/0.4ML ~~LOC~~ SOLN
40.0000 mg | SUBCUTANEOUS | Status: DC
  Administered 2019-02-22 – 2019-02-25 (×4): 40 mg via SUBCUTANEOUS
  Filled 2019-02-22 (×3): qty 0.4

## 2019-02-22 MED ORDER — ASPIRIN EC 81 MG PO TBEC
81.0000 mg | DELAYED_RELEASE_TABLET | Freq: Every day | ORAL | Status: DC
  Administered 2019-02-22: 81 mg via ORAL
  Filled 2019-02-22: qty 1

## 2019-02-22 MED ORDER — SODIUM CHLORIDE 0.9 % IV SOLN
INTRAVENOUS | Status: DC
  Administered 2019-02-22: 03:00:00 via INTRAVENOUS

## 2019-02-22 NOTE — ED Notes (Addendum)
ED TO INPATIENT HANDOFF REPORT  ED Nurse Name and Phone #:  Elsie Ra [phone]  S Name/Age/Gender [name] [name] 63 y.o. male Room/Bed: WA07/WA07  Code Status   Code Status: Prior  Home/SNF/Other Home Patient oriented to: self, place, time and situation Is this baseline? Yes   Triage Complete: Triage complete  Chief Complaint Hyperglycemia, SOB, Nausea  Triage Note Pt stated "My sugar has been high all day.  My meter wouldn't read it the last time.  I took lantus 45 novolog 25 units around 1100-1130 and another novolog 20 units @ 1600.  I haven't taken my other lantus.  Ate a bowl of soup around 1300."   Allergies No Known Allergies  Level of Care/Admitting Diagnosis ED Disposition    ED Disposition Condition Comment   Admit  Hospital Area: [facility] [[id number]]  Level of Care: Stepdown [14]  Admit to SDU based on following criteria: Hemodynamic compromise or significant risk of instability:  Patient requiring short term acute titration and management of vasoactive drips, and invasive monitoring (i.e., CVP and Arterial line).  Diagnosis: DKA (diabetic ketoacidoses) (HCC) [583094]  Admitting Physician: Rometta Emery [[id number]]  Attending Physician: Rometta Emery [[id number]]  Estimated length of stay: past midnight tomorrow  Certification:: I certify this patient will need inpatient services for at least 2 midnights  PT Class (Do Not Modify): Inpatient [101]  PT Acc Code (Do Not Modify): Private [1]       B Medical/Surgery History Past Medical History:  Diagnosis Date  . Barrett's esophagus   . CAD (coronary artery disease)    a. NSTEMI 05/2014 - occluded RCA dominant proximal s/p asp-thrombectomy/DES to RCA, minimal LAD/LCx, EF 50% by cath, 65-70% by echo.  . Depression   . Diabetes type 2, uncontrolled (HCC)   . Former tobacco use   . GERD (gastroesophageal reflux disease)   . Hemorrhoids    hx of  . Hypercholesterolemia   . Hypertension   . MI  (myocardial infarction) (HCC)   . Peripheral neuropathy    Past Surgical History:  Procedure Laterality Date  . LEFT HEART CATHETERIZATION WITH CORONARY ANGIOGRAM N/A 06/14/2014   Procedure: LEFT HEART CATHETERIZATION WITH CORONARY ANGIOGRAM;  Surgeon: Corky Crafts, MD;  Location: [facility] CATH LAB;  Service: Cardiovascular;  Laterality: N/A;  . TONSILLECTOMY       A IV Location/Drains/Wounds Patient Lines/Drains/Airways Status   Active Line/Drains/Airways    Name:   Placement date:   Placement time:   Site:   Days:   Peripheral IV 02/21/19 Left Wrist   02/21/19    2211    Wrist   1   Peripheral IV 02/22/19 Left Antecubital   02/22/19    0054    Antecubital   less than 1          Intake/Output Last 24 hours  Intake/Output Summary (Last 24 hours) at 02/22/2019 0055 Last data filed at 02/21/2019 2329 Gross per 24 hour  Intake 1000 ml  Output -  Net 1000 ml    Labs/Imaging Results for orders placed or performed during the hospital encounter of 02/21/19 (from the past 48 hour(s))  CBG monitoring, ED     Status: Abnormal   Collection Time: 02/21/19  9:09 PM  Result Value Ref Range   Glucose-Capillary >600 (HH) 70 - 99 mg/dL   Comment 1 Notify RN   Urinalysis, Routine w reflex microscopic     Status: Abnormal   Collection Time: 02/21/19  9:43 PM  Result Value Ref Range   Color, Urine COLORLESS (A) YELLOW   APPearance CLEAR CLEAR   Specific Gravity, Urine 1.023 1.005 - 1.030   pH 5.0 5.0 - 8.0   Glucose, UA >=500 (A) NEGATIVE mg/dL   Hgb urine dipstick NEGATIVE NEGATIVE   Bilirubin Urine NEGATIVE NEGATIVE   Ketones, ur 80 (A) NEGATIVE mg/dL   Protein, ur NEGATIVE NEGATIVE mg/dL   Nitrite NEGATIVE NEGATIVE   Leukocytes,Ua NEGATIVE NEGATIVE   RBC / HPF 0-5 0 - 5 RBC/hpf   WBC, UA 0-5 0 - 5 WBC/hpf   Bacteria, UA NONE SEEN NONE SEEN   Squamous Epithelial / LPF 0-5 0 - 5    Comment: Performed at [facility], [address], [place], [place] [address]   Comprehensive metabolic panel     Status: Abnormal   Collection Time: 02/21/19 10:15 PM  Result Value Ref Range   Sodium 129 (L) 135 - 145 mmol/L   Potassium 5.4 (H) 3.5 - 5.1 mmol/L    Comment: NO VISIBLE HEMOLYSIS   Chloride 98 98 - 111 mmol/L   CO2 13 (L) 22 - 32 mmol/L   Glucose, Bld 815 (HH) 70 - 99 mg/dL    Comment: CRITICAL RESULT CALLED TO, READ BACK BY AND VERIFIED WITHFeliz Beam RN 2325 02/21/19 A NAVARRO    BUN 26 (H) 6 - 20 mg/dL   Creatinine, Ser 8.31 (H) 0.61 - 1.24 mg/dL   Calcium 8.6 (L) 8.9 - 10.3 mg/dL   Total Protein 6.9 6.5 - 8.1 g/dL   Albumin 4.0 3.5 - 5.0 g/dL   AST 23 15 - 41 U/L   ALT 17 0 - 44 U/L   Alkaline Phosphatase 135 (H) 38 - 126 U/L   Total Bilirubin 1.4 (H) 0.3 - 1.2 mg/dL   GFR calc non Af Amer 41 (L) >60 mL/min   GFR calc Af Amer 48 (L) >60 mL/min   Anion gap 18 (H) 5 - 15    Comment: Performed at [facility], [address] [place], [place] [address]  CBC with Differential     Status: Abnormal   Collection Time: 02/21/19 10:15 PM  Result Value Ref Range   WBC 7.4 4.0 - 10.5 K/uL   RBC 4.52 4.22 - 5.81 MIL/uL   Hemoglobin 12.7 (L) 13.0 - 17.0 g/dL   HCT 60.7 37.1 - 06.2 %   MCV 90.9 80.0 - 100.0 fL   MCH 28.1 26.0 - 34.0 pg   MCHC 30.9 30.0 - 36.0 g/dL   RDW 69.4 85.4 - 62.7 %   Platelets 321 150 - 400 K/uL   nRBC 0.0 0.0 - 0.2 %   Neutrophils Relative % 73 %   Neutro Abs 5.4 1.7 - 7.7 K/uL   Lymphocytes Relative 19 %   Lymphs Abs 1.4 0.7 - 4.0 K/uL   Monocytes Relative 7 %   Monocytes Absolute 0.5 0.1 - 1.0 K/uL   Eosinophils Relative 1 %   Eosinophils Absolute 0.1 0.0 - 0.5 K/uL   Basophils Relative 0 %   Basophils Absolute 0.0 0.0 - 0.1 K/uL   Immature Granulocytes 0 %   Abs Immature Granulocytes 0.02 0.00 - 0.07 K/uL    Comment: Performed at [facility], [address], [place], [place] [address]  Blood gas, venous (at [facility] and [facility])     Status: Abnormal   Collection Time: 02/21/19 10:15  PM  Result Value Ref Range   O2 Content  ROOM AIR L/min   Delivery systems ROOM AIR    pH, Ven 7.302 7.250 - 7.430   pCO2, Ven 27.0 (L) 44.0 - 60.0 mmHg   pO2, Ven 42.8 32.0 - 45.0 mmHg   Bicarbonate 13.0 (L) 20.0 - 28.0 mmol/L   Acid-base deficit 11.8 (H) 0.0 - 2.0 mmol/L   O2 Saturation 74.5 %   Patient temperature 98.2    Collection site VEIN    Drawn by COLLECTED BY LABORATORY    Sample type VENOUS     Comment: Performed at [facility], [address] [place], [place] [address]  CBG monitoring, ED     Status: Abnormal   Collection Time: 02/22/19 12:45 AM  Result Value Ref Range   Glucose-Capillary >600 (HH) 70 - 99 mg/dL   Dg Chest Portable 1 View  Result Date: 02/21/2019 CLINICAL DATA:  Chest pain and dyspnea for 24 hours EXAM: PORTABLE CHEST 1 VIEW COMPARISON:  01/28/2019 FINDINGS: The heart size and mediastinal contours are within normal limits. Mild pulmonary hyperinflation, upper lobe predominant. Pulmonary consolidation or edema. The visualized skeletal structures are unremarkable. IMPRESSION: Mild upper lobe predominant hyperinflation of lungs. No active pulmonary disease. Electronically Signed   By: Tollie Eth M.D.   On: 02/21/2019 22:12    Pending Labs [employer] (From admission, onward)    Start     Ordered   Signed and Interior and spatial designer  STAT Now then every 4 hours ,   STAT     Signed and Held   Signed and Held  CBC  (enoxaparin (LOVENOX)    CrCl >/= 30 ml/min)  Once,   R    Comments:  Baseline for enoxaparin therapy IF NOT ALREADY DRAWN.  Notify MD if PLT < 100 K.    Signed and Held   Signed and Held  Creatinine, serum  (enoxaparin (LOVENOX)    CrCl >/= 30 ml/min)  Once,   R    Comments:  Baseline for enoxaparin therapy IF NOT ALREADY DRAWN.    Signed and Held   Signed and Held  Creatinine, serum  (enoxaparin (LOVENOX)    CrCl >/= 30 ml/min)  Weekly,   R    Comments:  while on enoxaparin therapy    Signed and Held           Vitals/Pain Today's Vitals   02/21/19 2230 02/21/19 2300 02/21/19 2330 02/22/19 0000  BP: 118/67 128/69 (!) 143/86 137/69  Pulse: (!) 107 (!) 105 (!) 106 (!) 106  Resp: (!) 23 17 (!) 22 (!) 21  Temp:      TempSrc:      SpO2: 97% 100% 97% 99%  Weight:      Height:      PainSc:        Isolation Precautions No active isolations  Medications Medications  insulin regular, human (MYXREDLIN) 100 units/ 100 mL infusion (5.4 Units/hr Intravenous New Bag/Given 02/22/19 0049)  dextrose 5 %-0.45 % sodium chloride infusion ( Intravenous Hold 02/21/19 2330)  sodium chloride 0.9 % bolus 1,000 mL (0 mLs Intravenous Stopped 02/21/19 2329)  ondansetron (ZOFRAN) injection 4 mg (4 mg Intravenous Given 02/21/19 2212)    Mobility walks Low fall risk   Focused Assessments Renal Assessment Handoff:    R Recommendations: See Admitting Provider Note  Report given to: RO RN  Additional Notes:

## 2019-02-22 NOTE — Progress Notes (Signed)
Inpatient Diabetes Program Recommendations  AACE/ADA: New Consensus Statement on Inpatient Glycemic Control (2015)  Target Ranges:  Prepandial:   less than 140 mg/dL      Peak postprandial:   less than 180 mg/dL (1-2 hours)      Critically ill patients:  140 - 180 mg/dL   Lab Results  Component Value Date   GLUCAP 149 (H) 02/22/2019   HGBA1C 10.0 (H) 01/14/2019    Review of Glycemic Control  Diabetes history: DM2 Outpatient Diabetes medications: Levemir 45 units bid, Novolog 2-15 units tidwc Current orders for Inpatient glycemic control: IV insulin - transitioned to Levemir 30 units bid, Novolog 0-15 units tidwc +8 units tidwc  Inpatient Diabetes Program Recommendations:     Increase Levemir to 35 units bid if FBS > 180 mg/dL.  Add HS correction  Spoke with pt regarding his diabetes control at home. Pt states his blood sugars usually run in the 200s.   Will follow closely.  Thank you. Ailene Ards, RD, LDN, CDE Inpatient Diabetes Coordinator [phone]

## 2019-02-22 NOTE — Progress Notes (Signed)
PROGRESS NOTE    [name]  JOA:[id number] DOB: [birth year] DOA: 02/21/2019 PCP: Sherren Mocha, MD   Brief Narrative:   [name] [name] is [name] [name] 63 y.o. male with medical history significant of insulin-dependent diabetes, hypertension, coronary artery disease, acute kidney injury, GERD, depression, Barrett's esophagus who presented with some shortness of breath and hyperglycemia.  Patient is on insulin and has been compliant with his insulin.  He started having symptoms this morning of nausea and some shortness of breath.  He later found his sugar to be markedly elevated.  Patient decided to come to the emergency room for evaluation.  He was found to have Zaniel Marineau blood sugar of more than 800 elevated anion gap as well as ketonuria.  Patient is being admitted with diabetic ketoacidosis.  He denied any abdominal pain or active diarrhea.  Denied any vomiting only nausea.  Patient did have diarrhea yesterday which was short lived.  He has had previous DKA's.  Assessment & Plan:   Principal Problem:   DKA (diabetic ketoacidoses) (HCC) Active Problems:   GERD (gastroesophageal reflux disease)   Peripheral neuropathy   CAD (coronary artery disease)   Hyperlipidemia   AKI (acute kidney injury) (HCC)   #1 diabetic ketoacidosis:  +ketones and AG on admission Unclear etiology.  Pt notes compliance with home regimen. A1c last month is 10 Takes 45 units levemir in AM and 40 at night as well as 15 units short acting with breakfast and dinner.  AG closed this morning.  Pt has NAGMA likely 2/2 NS resuscitation. Will start levemir at 30 units BID and 8 units mealtime insulin Continue to monitor blood sugars Hopefully d/c in AM once monitored on home regimen  # NAGMA: likely related to NS resuscitation  #2 diabetic neuropathy: Continue with his pain management from home (gabapentin, norco).  # Hypertension: continue lisinopril, metoprolol  #3 coronary artery disease: Stable.  No chest pain and no evidence  of coronary artery decompensation.  #4 hyperlipidemia: Continue with statin.  #5 acute kidney injury: Aggressively hydrate.  Improved with IVF.  Continue ACEi as ordered.  #6 GERD: Continue with PPIs  DVT prophylaxis: lovenox Code Status: full  Family Communication: none at bedside Disposition Plan: pending improvement, likely tomorrow AM   Consultants:   None  Procedures:   none  Antimicrobials:  Anti-infectives (From admission, onward)   None     Subjective: Denies fever, sick contacts. Denies missed doses of insulin. Feels ok right now.  Objective: Vitals:   02/22/19 0800 02/22/19 0917 02/22/19 1129 02/22/19 1200  BP: 123/60 (!) 110/59  114/62  Pulse: 79 76  71  Resp: 19   19  Temp: 98.1 F (36.7 C)  98.2 F (36.8 C)   TempSrc: Oral  Oral   SpO2: 96%   96%  Weight:      Height:        Intake/Output Summary (Last 24 hours) at 02/22/2019 1404 Last data filed at 02/22/2019 0600 Gross per 24 hour  Intake 3240.55 ml  Output 625 ml  Net 2615.55 ml   Filed Weights   02/21/19 2110  Weight: 108.9 kg    Examination:  General exam: Appears calm and comfortable  Respiratory system: Clear to auscultation. Respiratory effort normal. Cardiovascular system: S1 & S2 heard, RRR.  Gastrointestinal system: Abdomen is nondistended, soft and nontender Central nervous system: Alert and oriented. No focal neurological deficits. Extremities: Symmetric 5 x 5 power. Skin: No rashes, lesions or ulcers Psychiatry: Judgement and insight  appear normal. Mood & affect appropriate.     Data Reviewed: I have personally reviewed following labs and imaging studies  CBC: Recent Labs  Lab 02/21/19 2215 02/22/19 0214  WBC 7.4 6.9  NEUTROABS 5.4  --   HGB 12.7* 13.1  HCT 41.1 42.0  MCV 90.9 91.1  PLT 321 282   Basic Metabolic Panel: Recent Labs  Lab 02/21/19 2215 02/22/19 0214 02/22/19 0549 02/22/19 0955  NA 129* 135 138 140  K 5.4* 5.1 4.4 3.8  CL 98 107  114* 116*  CO2 13* 8* 15* 17*  GLUCOSE 815* 659* 320* 102*  BUN 26* 26* 22* 19  CREATININE 1.75* 1.68* 1.39* 1.09  CALCIUM 8.6* 8.4* 8.4* 8.1*   GFR: Estimated Creatinine Clearance: 87.7 mL/min (by C-G formula based on SCr of 1.09 mg/dL). Liver Function Tests: Recent Labs  Lab 02/21/19 2215  AST 23  ALT 17  ALKPHOS 135*  BILITOT 1.4*  PROT 6.9  ALBUMIN 4.0   No results for input(s): LIPASE, AMYLASE in the last 168 hours. No results for input(s): AMMONIA in the last 168 hours. Coagulation Profile: No results for input(s): INR, PROTIME in the last 168 hours. Cardiac Enzymes: No results for input(s): CKTOTAL, CKMB, CKMBINDEX, TROPONINI in the last 168 hours. BNP (last 3 results) No results for input(s): PROBNP in the last 8760 hours. HbA1C: No results for input(s): HGBA1C in the last 72 hours. CBG: Recent Labs  Lab 02/22/19 0907 02/22/19 1009 02/22/19 1127 02/22/19 1231 02/22/19 1319  GLUCAP 117* 84 86 86 96   Lipid Profile: No results for input(s): CHOL, HDL, LDLCALC, TRIG, CHOLHDL, LDLDIRECT in the last 72 hours. Thyroid Function Tests: No results for input(s): TSH, T4TOTAL, FREET4, T3FREE, THYROIDAB in the last 72 hours. Anemia Panel: No results for input(s): VITAMINB12, FOLATE, FERRITIN, TIBC, IRON, RETICCTPCT in the last 72 hours. Sepsis Labs: No results for input(s): PROCALCITON, LATICACIDVEN in the last 168 hours.  Recent Results (from the past 240 hour(s))  MRSA PCR Screening     Status: None   Collection Time: 02/22/19  1:36 AM  Result Value Ref Range Status   MRSA by PCR NEGATIVE NEGATIVE Final    Comment:        The GeneXpert MRSA Assay (FDA approved for NASAL specimens only), is one component of Kimari Lienhard comprehensive MRSA colonization surveillance program. It is not intended to diagnose MRSA infection nor to guide or monitor treatment for MRSA infections. Performed at [facility], [address] [place], [place] [address]           Radiology Studies: Dg Chest Portable 1 View  Result Date: 02/21/2019 CLINICAL DATA:  Chest pain and dyspnea for 24 hours EXAM: PORTABLE CHEST 1 VIEW COMPARISON:  01/28/2019 FINDINGS: The heart size and mediastinal contours are within normal limits. Mild pulmonary hyperinflation, upper lobe predominant. Pulmonary consolidation or edema. The visualized skeletal structures are unremarkable. IMPRESSION: Mild upper lobe predominant hyperinflation of lungs. No active pulmonary disease. Electronically Signed   By: Tollie Eth M.D.   On: 02/21/2019 22:12        Scheduled Meds: . aspirin EC  81 mg Oral Daily  . Chlorhexidine Gluconate Cloth  6 each Topical Daily  . enoxaparin (LOVENOX) injection  40 mg Subcutaneous Q24H  . gabapentin  200 mg Oral BID  . insulin aspart  0-15 Units Subcutaneous TID WC  . insulin aspart  8 Units Subcutaneous TID WC  . insulin detemir  30 Units Subcutaneous BID  .  lisinopril  2.5 mg Oral Daily  . mouth rinse  15 mL Mouth Rinse BID  . metoprolol tartrate  12.5 mg Oral BID  . pantoprazole  40 mg Oral Daily  . rosuvastatin  40 mg Oral Daily   Continuous Infusions: . sodium chloride Stopped (02/22/19 0708)  . dextrose 5 % and 0.45% NaCl 125 mL/hr at 02/22/19 1315  . insulin 0.3 Units/hr (02/22/19 1319)     LOS: 1 day    Time spent: over 30 min    Lacretia Nicks, MD [facility] Pager AMION  If 7PM-7AM, please contact night-coverage [email or web address] Password [facility] 02/22/2019, 2:04 PM

## 2019-02-23 LAB — GLUCOSE, CAPILLARY
GLUCOSE-CAPILLARY: 122 mg/dL — AB (ref 70–99)
Glucose-Capillary: 183 mg/dL — ABNORMAL HIGH (ref 70–99)
Glucose-Capillary: 119 mg/dL — ABNORMAL HIGH (ref 70–99)
Glucose-Capillary: 81 mg/dL (ref 70–99)
Glucose-Capillary: 104 mg/dL — ABNORMAL HIGH (ref 70–99)

## 2019-02-23 LAB — CBC
HCT: 37.6 % — ABNORMAL LOW (ref 39.0–52.0)
HEMOGLOBIN: 11.5 g/dL — AB (ref 13.0–17.0)
MCH: 27.8 pg (ref 26.0–34.0)
MCHC: 30.6 g/dL (ref 30.0–36.0)
MCV: 90.8 fL (ref 80.0–100.0)
Platelets: 255 10*3/uL (ref 150–400)
RBC: 4.14 MIL/uL — ABNORMAL LOW (ref 4.22–5.81)
RDW: 13.7 % (ref 11.5–15.5)
WBC: 6.1 10*3/uL (ref 4.0–10.5)
nRBC: 0 % (ref 0.0–0.2)

## 2019-02-23 LAB — BASIC METABOLIC PANEL
Anion gap: 4 — ABNORMAL LOW (ref 5–15)
BUN: 15 mg/dL (ref 6–20)
CHLORIDE: 111 mmol/L (ref 98–111)
CO2: 21 mmol/L — ABNORMAL LOW (ref 22–32)
Calcium: 7.8 mg/dL — ABNORMAL LOW (ref 8.9–10.3)
Creatinine, Ser: 0.99 mg/dL (ref 0.61–1.24)
GFR calc Af Amer: >60 mL/min (ref >60)
GFR calc non Af Amer: >60 mL/min (ref >60)
Glucose, Bld: 234 mg/dL — ABNORMAL HIGH (ref 70–99)
Potassium: 4 mmol/L (ref 3.5–5.1)
Sodium: 136 mmol/L (ref 135–145)

## 2019-02-23 LAB — HEPATIC FUNCTION PANEL
ALT: 12 U/L (ref 0–44)
AST: 22 U/L (ref 15–41)
Albumin: 3 g/dL — ABNORMAL LOW (ref 3.5–5.0)
Alkaline Phosphatase: 108 U/L (ref 38–126)
Bilirubin, Direct: 0.1 mg/dL (ref 0.0–0.2)
Indirect Bilirubin: 0.3 mg/dL (ref 0.3–0.9)
TOTAL PROTEIN: 5.4 g/dL — AB (ref 6.5–8.1)
Total Bilirubin: 0.4 mg/dL (ref 0.3–1.2)

## 2019-02-23 LAB — LIPASE, BLOOD: Lipase: 20 U/L (ref 11–51)

## 2019-02-23 LAB — TROPONIN I
TROPONIN I: 0.06 ng/mL — AB (ref <0.03)
TROPONIN I: 0.07 ng/mL — AB (ref <0.03)
Troponin I: 0.05 ng/mL (ref <0.03)

## 2019-02-23 LAB — MAGNESIUM: Magnesium: 1.9 mg/dL (ref 1.7–2.4)

## 2019-02-23 MED ORDER — ASPIRIN EC 81 MG PO TBEC
81.0000 mg | DELAYED_RELEASE_TABLET | Freq: Every day | ORAL | Status: DC
  Administered 2019-02-24 – 2019-02-25 (×2): 81 mg via ORAL
  Filled 2019-02-23 (×2): qty 1

## 2019-02-23 MED ORDER — ASPIRIN 81 MG PO CHEW
324.0000 mg | CHEWABLE_TABLET | Freq: Once | ORAL | Status: AC
  Administered 2019-02-23: 324 mg via ORAL
  Filled 2019-02-23: qty 4

## 2019-02-23 MED ORDER — ALUM & MAG HYDROXIDE-SIMETH 200-200-20 MG/5ML PO SUSP
30.0000 mL | Freq: Once | ORAL | Status: AC
  Administered 2019-02-23: 30 mL via ORAL
  Filled 2019-02-23: qty 30

## 2019-02-23 MED ORDER — INSULIN ASPART 100 UNIT/ML ~~LOC~~ SOLN
4.0000 [IU] | Freq: Three times a day (TID) | SUBCUTANEOUS | Status: DC
  Administered 2019-02-23 – 2019-02-25 (×5): 4 [IU] via SUBCUTANEOUS

## 2019-02-23 MED ORDER — LIDOCAINE VISCOUS HCL 2 % MT SOLN
15.0000 mL | Freq: Once | OROMUCOSAL | Status: AC
  Administered 2019-02-23: 15 mL via ORAL
  Filled 2019-02-23: qty 15

## 2019-02-23 NOTE — Progress Notes (Signed)
CRITICAL VALUE ALERT  Critical Value:  Troponin 0.06  Date & Time Notied:  02/23/19 1006 Provider Notified: Dr Lowell Guitar  Orders Received/Actions taken:02/23/19 1015 new orders given

## 2019-02-23 NOTE — Consult Note (Addendum)
Cardiology Consultation:   Patient ID: [name] [name]; [id number]; Mar 04, 1956   Admit date: 02/21/2019 Date of Consult: 02/23/2019  Primary Care Provider: Sherren Mocha, MD Primary Cardiologist: Lance Muss, MD  Patient Profile:   [name] [name] is a 63 y.o. male with a hx of CAD (NSTEMI in 2015 - DES to RCA), LVEF 50-65%, insulin dependent DM2, HTN, HLD and peripheral neuropathy who is being seen today for the evaluation of elevated troponin at the request of Dr. Lowell Guitar.  History of Present Illness:   [name] [name] is a 63yo M with a hx as stated above who presented to [facility] on 02/23/2019 with hyperglycemia and nausea/vomiting as well as chest/epigastric tightness for one week. He states that his blood sugars at home were in the 800 range prior to admission. He states that the chest tightness began approximately one week ago. He points to his epigastric region as the location of his discomfort. He reports that it is worse with exertion and relieved with rest however the tightness remains. He has not taken any SL HTN during this specific episode, but reports that he has taken it on several instances over the last several years and reports that sometimes it will help and sometimes it will not. He denies diaphoresis, arm or hand involvement, no SOB, LE swelling more than his baseline. He does have orthopnea symptoms but does not appears to be fluid volume overloaded. His chest pain is reproducible on palpation.   In the ED, his BS was > 800 and he was admitted to hospitalist service with DKA. EKG was without ischemic changes. Troponin level was found to be mildly elevated at 0.06, 0.07>0.05, 0.06 with no recurrent chest discomfort.   Of note, he was last evaluated by our service 10/2018 in hospital consultation for dizziness, vision changes and back back which are apparently his anginal equivalent. EKG at that time was without ischemic changes.Troponin levels were negative, BNP was low and blood  pressure was WNL. An echocardiogram showed LVEF of 60-65% with NWMA and G1DD. Given his relatively negative workup, he was scheduled for OP Lexiscan stress test which appears to have never been performed. Additionally, he has not been seen in follow up since that time.    Past Medical History:  Diagnosis Date   Barrett's esophagus    CAD (coronary artery disease)    a. NSTEMI 05/2014 - occluded RCA dominant proximal s/p asp-thrombectomy/DES to RCA, minimal LAD/LCx, EF 50% by cath, 65-70% by echo.   Depression    Diabetes type 2, uncontrolled (HCC)    Former tobacco use    GERD (gastroesophageal reflux disease)    Hemorrhoids    hx of   Hypercholesterolemia    Hypertension    MI (myocardial infarction) (HCC)    Peripheral neuropathy     Past Surgical History:  Procedure Laterality Date   LEFT HEART CATHETERIZATION WITH CORONARY ANGIOGRAM N/A 06/14/2014   Procedure: LEFT HEART CATHETERIZATION WITH CORONARY ANGIOGRAM;  Surgeon: Corky Crafts, MD;  Location: [facility] CATH LAB;  Service: Cardiovascular;  Laterality: N/A;   TONSILLECTOMY       Prior to Admission medications   Medication Sig Start Date End Date Taking? Authorizing Provider  EQ ASPIRIN ADULT LOW DOSE 81 MG EC tablet TAKE ONE TABLET BY MOUTH ONCE DAILY Patient taking differently: Take 81 mg by mouth daily.  07/02/15  Yes Corky Crafts, MD  gabapentin (NEURONTIN) 100 MG capsule TAKE 2 CAPSULES BY MOUTH EVERY MORNING AND 2  CAPSULES BY MOUTH  AT BEDTIME Patient taking differently: Take 200 mg by mouth 2 (two) times daily. TAKE 2 CAPSULES BY MOUTH EVERY MORNING AND 2 CAPSULES BY MOUTH  AT BEDTIME 12/07/18  Yes Jones Bales, NP  HYDROcodone-acetaminophen (NORCO) 7.5-325 MG tablet Take 1 tablet by mouth every 6 (six) hours as needed for moderate pain. 02/03/19  Yes Jones Bales, NP  insulin aspart (NOVOLOG) 100 UNIT/ML injection Inject 0-15 Units into the skin 3 (three) times daily with meals for 30 days. For  glucose 121 to 150 use 2 units, for 151 to 200 use 4 units, for 201-250 use 7 units, for 251 to 300 use 9 units, for 301 to 350 use 12 units, for 351 or greater use 15 units. 01/17/19 02/21/19 Yes Arrien, York Ram, MD  insulin detemir (LEVEMIR) 100 UNIT/ML injection Inject 0.2 mLs (20 Units total) into the skin 2 (two) times daily for 30 days. Patient taking differently: Inject 45 Units into the skin 2 (two) times daily.  01/17/19 02/21/19 Yes Arrien, York Ram, MD  lisinopril (PRINIVIL,ZESTRIL) 2.5 MG tablet TAKE 1 TABLET(2.5 MG) BY MOUTH DAILY Patient taking differently: Take 2.5 mg by mouth daily.  12/06/18  Yes Sherren Mocha, MD  methocarbamol (ROBAXIN) 500 MG tablet Take 500 mg by mouth 2 (two) times daily as needed for muscle spasms. 12/29/18  Yes [provider]  metoprolol tartrate (LOPRESSOR) 25 MG tablet TAKE 1/2 TABLET(12.5 MG) BY MOUTH TWICE DAILY Patient taking differently: Take 12.5 mg by mouth 2 (two) times daily.  09/12/18  Yes Sherren Mocha, MD  nitroGLYCERIN (NITROSTAT) 0.4 MG SL tablet Place 1 tablet (0.4 mg total) under the tongue every 5 (five) minutes as needed for chest pain (up to 3 doses). 11/10/16  Yes Corky Crafts, MD  pantoprazole (PROTONIX) 40 MG tablet TAKE 1 TABLET BY MOUTH EVERY DAY Patient taking differently: Take 40 mg by mouth daily.  12/29/18  Yes Sherren Mocha, MD  rosuvastatin (CRESTOR) 40 MG tablet TAKE 1 TABLET(40 MG) BY MOUTH DAILY Patient taking differently: Take 40 mg by mouth daily.  12/29/18  Yes Sherren Mocha, MD    Inpatient Medications: Scheduled Meds:  Chlorhexidine Gluconate Cloth  6 each Topical Daily   enoxaparin (LOVENOX) injection  40 mg Subcutaneous Q24H   gabapentin  200 mg Oral BID   insulin aspart  0-15 Units Subcutaneous TID WC   insulin aspart  8 Units Subcutaneous TID WC   insulin detemir  30 Units Subcutaneous BID   lisinopril  2.5 mg Oral Daily   mouth rinse  15 mL Mouth Rinse BID   metoprolol tartrate  12.5  mg Oral BID   pantoprazole  40 mg Oral Daily   rosuvastatin  40 mg Oral Daily   Continuous Infusions:  insulin Stopped (02/22/19 1421)   PRN Meds: HYDROcodone-acetaminophen, methocarbamol, nitroGLYCERIN  Allergies:   No Known Allergies  Social History:   Social History   Socioeconomic History   Marital status: Married    Spouse name: Not on file   Number of children: Not on file   Years of education: Not on file   Highest education level: Not on file  Occupational History   Occupation: unemployed    Comment: applying for disability  Social Needs   Financial resource strain: Not on file   Food insecurity:    Worry: Not on file    Inability: Not on file   Transportation needs:    Medical: Not on  file    Non-medical: Not on file  Tobacco Use   Smoking status: Former Smoker    Packs/day: 0.25    Years: 3.00    Pack years: 0.75    Last attempt to quit: 11/30/1998    Years since quitting: 20.2   Smokeless tobacco: Never Used  Substance and Sexual Activity   Alcohol use: No    Alcohol/week: 1.0 standard drinks    Types: 1 Standard drinks or equivalent per week   Drug use: No   Sexual activity: Yes    Birth control/protection: None  Lifestyle   Physical activity:    Days per week: Not on file    Minutes per session: Not on file   Stress: Not on file  Relationships   Social connections:    Talks on phone: Not on file    Gets together: Not on file    Attends religious service: Not on file    Active member of club or organization: Not on file    Attends meetings of clubs or organizations: Not on file    Relationship status: Not on file   Intimate partner violence:    Fear of current or ex partner: Not on file    Emotionally abused: Not on file    Physically abused: Not on file    Forced sexual activity: Not on file  Other Topics Concern   Not on file  Social History Narrative   Not on file    Family History:   Family History  Problem  Relation Age of Onset   Hypertension Mother    Alzheimer's disease Mother    Alcohol abuse Father    Cancer Father 49       "Throat"   Diabetes type II Brother    Cancer Brother 68       throat cancer   Heart disease Neg Hx    Family Status:  Family Status  Relation Name Status   Mother  Deceased   Father  Deceased   Brother  Deceased   Neg Hx  (Not Specified)    ROS:  Please see the history of present illness.  All other ROS reviewed and negative.     Physical Exam/Data:   Vitals:   02/23/19 0516 02/23/19 0823 02/23/19 0951 02/23/19 1412  BP: 129/80 134/81 132/76 118/69  Pulse: 75 77 79 75  Resp: Temp: (!) 97.5 F (36.4 C) 97.8 F (36.6 C) 97.8 F (36.6 C) 98 F (36.7 C)  TempSrc: Oral Oral Oral Oral  SpO2: 95% 97% 96% 100%  Weight:      Height:        Intake/Output Summary (Last 24 hours) at 02/23/2019 1629 Last data filed at 02/23/2019 0951 Gross per 24 hour  Intake 2002.86 ml  Output 1250 ml  Net 752.86 ml   Filed Weights   02/21/19 2110 02/22/19 1701  Weight: 108.9 kg 106.6 kg   Body mass index is 34.72 kg/m.   General: Well developed, well nourished, NAD Skin: Warm, dry, intact  Head: Normocephalic, atraumatic, clear, moist mucus membranes. Neck: Negative for carotid bruits. No JVD Lungs:Clear to ausculation bilaterally. No wheezes, rales, or rhonchi. Breathing is unlabored. Cardiovascular: RRR with S1 S2. No murmurs, rubs, gallops, or LV heave appreciated. Abdomen: Soft, tender in the epigastric region, non-distended with normoactive bowel sounds. No obvious abdominal masses. MSK: Strength and tone appear normal for age. 5/5 in all extremities Extremities: No edema. No clubbing or cyanosis.  DP/PT pulses 2+ bilaterally Neuro: Alert and oriented. No focal deficits. No facial asymmetry. MAE spontaneously. Psych: Responds to questions appropriately with normal affect.     EKG:  The EKG was personally reviewed and  demonstrates: 02/23/2019 NSR with no acute changes  Telemetry:  Telemetry was personally reviewed and demonstrates: No currently on telemetry   Relevant CV Studies:  ECHO: 11/20/2018: LV EF: 60% - 65%  ------------------------------------------------------------------- Indications: Syncope 780.2.  ------------------------------------------------------------------- History: PMH: Coronary artery disease. PMH: NSTEMI Risk factors: Acute kidney injury Diabetes mellitus. Dyslipidemia.  ------------------------------------------------------------------- Study Conclusions  - Left ventricle: The cavity size was normal. Wall thickness was increased in a pattern of mild LVH. Systolic function was normal. The estimated ejection fraction was in the range of 60% to 65%. Wall motion was normal; there were no regional wall motion abnormalities. Doppler parameters are consistent with abnormal left ventricular relaxation (grade 1 diastolic dysfunction).  Impressions:  - Normal LV systolic function; mild diastolic dysfunction; mild LVH.  CATH: 05/2018: Cath 06/14/14 PROCEDURE: Left heart catheterization with selective coronary angiography, left ventriculogram. Aspiration thrombectomy of the RCA. PCI of the RCA  INDICATIONS: Non-ST elevation MI  The risks, benefits, and details of the procedure were explained to the patient. The patient verbalized understanding and wanted to proceed. Informed written consent was obtained.  PROCEDURE TECHNIQUE: After Xylocaine anesthesia a 66F slender sheath was placed in the right radial artery with a single anterior needle wall stick. IV heparin was given. Right coronary angiography was done using a Judkins R4 guide catheter. Left coronary angiography was done using an EBU 3.0 guide catheter. The intervention was performed. Please see below for details. Left ventriculography was done using a pigtail catheter. A TR band was used for  hemostasis.  CONTRAST: Total of 175 cc.  COMPLICATIONS: None.  HEMODYNAMICS: Aortic pressure was 94/61; LV pressure was 91/17; LVEDP 20. There was no gradient between the left ventricle and aorta.  ANGIOGRAPHIC DATA: The left main coronary artery is a short vessel, but widely patent.  The left anterior descending artery is a large vessel. There is mild atherosclerosis proximally. There is a large first diagonal. After the origin of the diagonal in the mid LAD, there is a 25%, eccentric stenosis. The mid to distal LAD is widely patent. There are collaterals from the septal vessels to the distal RCA.  The left circumflex artery is a large vessel. The first obtuse marginal is small but patent. The second obtuse marginal is a large vessel which branches across the lateral wall. It appears angiographic normal. The remainder of the circumflex is medium size. There are several terminal obtuse marginal vessels which are patent.  The right coronary artery is a large, dominant vessel. The vessel has an anterior takeoff and requires an Amplatz guide to reach. The vessel was occluded proximally. After the intervention, it was noted that there is a shepherd's crux proximally. There is tortuosity in the distal vessel as well. Posterior descending artery is medium size. The posterior lateral artery a small. There is only minimal atherosclerosis of the RCA system outside of the stented segment.  LEFT VENTRICULOGRAM: Left ventricular angiogram was done in the 30 RAO projection and revealed mild basal inferior hypokinesis with overall normal systolic function with an estimated ejection fraction of 50 %. LVEDP was 20 mmHg.  PCI NARRATIVE: Initially, a Williams right guide catheter was used. This was unsuccessful at reaching the ostium of the RCA. An AL-1 catheter was then successfully used to engage the RCA. A pro-water  wire was placed across the occlusion in the proximal vessel. A priority 1 aspiration catheter was advanced  for aspiration thrombectomy. There was improvement of flow after aspiration thrombectomy. A 2.5 x 15 balloon was then used to treat the thrombotic area in the proximal right coronary artery. It was noted that there appeared to be a lesion in the distal RCA. The wire was pulled back and this turned out to be only pseudo-lesion. The diseased area in the proximal RCA was stented with a 2.75 x 23 mm Xience drug-eluting stent. The stent was post dilated with a 3.0 x 15 noncompliant balloon several inflations, to 3.1 mm in diameter. Several doses of intra-coronary nitroglycerin were given during the procedure. The patient tolerated the procedure well.  IMPRESSIONS:  6. Normal left main coronary artery. 7. Mild disease in the left anterior descending artery and its branches. 8. Minimal disease in the left circumflex artery and its branches. 9. Occluded proximal right coronary artery with left to right collaterals. The proximal right coronary artery was successfully treated with a 2.75 x 23mm Xience drug-eluting stent, postdilated to 3.1 mm in diameter. An Amplatz 1 guide catheter was needed to reach the ostium of the RCA. There is significant pseudo-lesion noted when the wire was in place, but this resolved with wire removal. 10. Overall normal left ventricular systolic function. LVEDP 20 mmHg. Ejection fraction 50 %. RECOMMENDATION: I stressed the importance of dual antiplatelet therapy for at least a year to the patient. We also talked about diabetes control to help prevent future coronary events. He will be watched in the hospital overnight. His blood pressure is on the low side and may limit initiation of beta blocker and ACE inhibitor. He will need statin therapy. We'll use prasugrel and aspirin.  He'll need significant education about cardiac risk factors. He would benefit from cardiac rehabilitation.      Laboratory Data:  Chemistry Recent Labs  Lab 02/22/19 0955 02/22/19 1351 02/23/19 0441  NA  140 140 136  K 3.8 4.1 4.0  CL 116* 114* 111  CO2 17* 18* 21*  GLUCOSE 102* 130* 234*  BUN CREATININE 1.09 1.06 0.99  CALCIUM 8.1* 8.0* 7.8*  GFRNONAA >60 >60 >60  GFRAA >60 >60 >60  ANIONGAP 7 8 4*    Total Protein  Date Value Ref Range Status  02/23/2019 5.4 (L) 6.5 - 8.1 g/dL Final  56/21/3086 6.4 6.0 - 8.5 g/dL Final   Albumin  Date Value Ref Range Status  02/23/2019 3.0 (L) 3.5 - 5.0 g/dL Final  57/84/6962 4.2 3.5 - 5.5 g/dL Final   AST  Date Value Ref Range Status  02/23/2019 22 15 - 41 U/L Final   ALT  Date Value Ref Range Status  02/23/2019 12 0 - 44 U/L Final   Alkaline Phosphatase  Date Value Ref Range Status  02/23/2019 108 38 - 126 U/L Final   Total Bilirubin  Date Value Ref Range Status  02/23/2019 0.4 0.3 - 1.2 mg/dL Final   Bilirubin Total  Date Value Ref Range Status  03/11/2018 0.3 0.0 - 1.2 mg/dL Final   Hematology Recent Labs  Lab 02/21/19 2215 02/22/19 0214 02/23/19 0441  WBC 7.4 6.9 6.1  RBC 4.52 4.61 4.14*  HGB 12.7* 13.1 11.5*  HCT 41.1 42.0 37.6*  MCV 90.9 91.1 90.8  MCH 28.1 28.4 27.8  MCHC 30.9 31.2 30.6  RDW 13.8 13.8 13.7  PLT 321 282 255   Cardiac Enzymes Recent Labs  Lab 02/23/19 0903 02/23/19 1441  TROPONINI 0.06* 0.07*   No results for input(s): TROPIPOC in the last 168 hours.  BNPNo results for input(s): BNP, PROBNP in the last 168 hours.  DDimer No results for input(s): DDIMER in the last 168 hours. TSH:  Lab Results  Component Value Date   TSH 1.280 03/11/2018   Lipids: Lab Results  Component Value Date   CHOL 144 11/20/2018   HDL 41 11/20/2018   LDLCALC 68 11/20/2018   TRIG 173 (H) 11/20/2018   CHOLHDL 3.5 11/20/2018   HgbA1c: Lab Results  Component Value Date   HGBA1C 10.0 (H) 01/14/2019    Radiology/Studies:  Dg Chest Portable 1 View  Result Date: 02/21/2019 CLINICAL DATA:  Chest pain and dyspnea for 24 hours EXAM: PORTABLE CHEST 1 VIEW COMPARISON:  01/28/2019 FINDINGS: The heart  size and mediastinal contours are within normal limits. Mild pulmonary hyperinflation, upper lobe predominant. Pulmonary consolidation or edema. The visualized skeletal structures are unremarkable. IMPRESSION: Mild upper lobe predominant hyperinflation of lungs. No active pulmonary disease. Electronically Signed   By: Tollie Eth M.D.   On: 02/21/2019 22:12    Assessment and Plan:   1. Atypical chest/epigastric pain with hx of CAD s/p NSTEMI PCI/DES to RCA 2015: -Prior PCI/DES to RCA in 2015 per cath report above  -Echo with normal LV function and NWMA -Pt presented with hyperglycemia and nausea/vomiting as well as chest/epigastric tightness for one week. He states that his blood sugars at home were in the 800 range prior to admission>>treated for DKA on admission. He states that the chest tightness began approximately one week ago. He points to his epigastric region as the location of his discomfort. He reports that it is worse with exertion and relieved with rest however the tightness remains. He has not taken any SL NTG during this specific episode, but reports that he has taken it on several instances over the last several years and reports that sometimes it will help and sometimes it will not. Unclear at to whether this is cardiac or GI etiology as he was dx with DKA on admission with N/V and diarrhea for >3 days . No fevers, chills or COVID-19 contacts -Troponin in 2015 was greater than 1.7 -Troponin mildly elevated, 0.05, 0.07, 0.06 -EKG with NSR with no acute changes  -CXR with no active pulmonary disease -Post cath, pt was evaluated by our service for recurrent chest pain in which he was to undergo an OP Lexiscan stress test however then patient never followed up with this and has not been seen in the office since this time -Pt ate breakfast this AM and therefore cannot undergo stress test today, however will plan for this tomorrow at [facility] -Continue ASA, metoprolol, Crestor   2. Poorly  controlled DM2 presenting with DKA: -BS on presentation greater than 800 -Currently on SSI for glucose control while inpatient status -Per primary team   3. HLD: -Last LDL, 68 10/2018 -Continue Crestor   4. GERD: -Pt reports that he is moderately controlled on daily Protonix  -Has epigastric pain with some reflux symptoms with lying flat   5. Neuropathy: -Reports LE pain and some tingling -On Neurontin  -No swelling   5. HTN: -Elevated, 150/90>147/86>141/83 -Continue metoprolol, lisinopril  -Will increase lisinopril to 5 and monitor BP for titration  -Creatinine, 0.88 today    For questions or updates, please contact [facility] Please consult [email or web address] for contact info under Cardiology/STEMI.   Signed, Georgie Chard NP-C [facility] Pager: [phone]  02/23/2019 4:29 PM  Patient examined chart reviewed Benign exam with no murmur clear lungs soft abdomen and good peripheral pulses ECG no acute changes Minimal elevation in troponin in setting of DKA and atypical chest pain. History of CAD with DES to RCA. Ate this am. Will order myovue for am to risk stratify given CRF;s known disease and poorly controlled DM although doubt any acute event going on Never f/u with previous myovue  December 2019 and with COVID 19 not doing outpatient studies for a while so will do now while inpatient having DKA Rx  Charlton Haws

## 2019-02-23 NOTE — Progress Notes (Signed)
**Note De-Identified vi Obfusction** PROGRESS NOTE    [name] [name]  YYQ:[id number] DOB: [birth year] DOA: 02/21/2019 PCP: Sherren Moch, MD   Brief Nrrtive:   [name] [name] is  63 y.o. mle with medicl history significnt of insulin-dependent dibetes, hypertension, coronry rtery disese, cute kidney injury, GERD, depression, Brrett's esophgus who presented with some shortness of breth nd hyperglycemi.  Ptient is on insulin nd hs been complint with his insulin.  He strted hving symptoms this morning of nuse nd some shortness of breth.  He lter found his sugr to be mrkedly elevted.  Ptient decided to come to the emergency room for evlution.  He ws found to hve  blood sugr of more thn 800 elevted nion gp s well s ketonuri.  Ptient is being dmitted with dibetic ketocidosis.  He denied ny bdominl pin or ctive dirrhe.  Denied ny vomiting only nuse.  Ptient did hve dirrhe yesterdy which ws short lived.  He hs hd previous DKA's.  Assessment & Pln:   Principl Problem:   DKA (dibetic ketocidoses) (HCC) Active Problems:   GERD (gstroesophgel reflux disese)   Peripherl neuropthy   CAD (coronry rtery disese)   Hyperlipidemi   AKI (cute kidney injury) (HCC)   #1 dibetic ketocidosis:  +ketones nd AG on dmission Uncler etiology.  Pt notes complince with home regimen. A1c lst month is 10 Tkes 45 units levemir in AM nd 40 t night s well s 15 units short cting with brekfst nd dinner.  AG closed this morning.  Pt hs NAGMA likely 2/2 NS resuscittion. Will strt levemir t 30 units BID nd 8 units meltime insulin Continue to monitor blood sugrs Hopefully d/c in AM once monitored on home regimen  # Atypicl Chest Pin  Elevted Troponin: mildly elevted troponin with typicl CP.  He notes he hs this pin occsionlly, describes s pressure in epigstric region which gets better with nitro.  Pin ws reproducible with plption to his epigstric  region.  Pt with hx NSTEMI in 2015 with DES to RCA. EKG showed NSR nd ppered similr to priors Gve full dose ASA.  Continue to trend troponin, initil mildly elevted t 0.06. Continue metoprolol, lisinopril, nd sttin. Nitro prn Will need to dd heprin if troponin continues to rise.  Consider crdiology consult  # Abdominl Pin: pt with epigstric/bdominl pin on exm.  Tolerting PO.  Negtive lipse nd LFT's.  Consider dditionl imging.    # NAGMA: likely relted to NS resuscittion, improving  #2 dibetic neuropthy: Continue with his pin mngement from home (gbpentin, norco).  # Hypertension: continue lisinopril, metoprolol  #3 coronry rtery disese: As noted bove.  #4 hyperlipidemi: Continue with sttin.  #5 cute kidney injury: Aggressively hydrte.  Improved with IVF.  Continue ACEi s ordered.  #6 GERD: Continue with PPIs  DVT prophylxis: lovenox Code Sttus: full  Fmily Communiction: none t bedside Disposition Pln: pending evl of typicl CP bove   Consultnts:   None  Procedures:   none  Antimicrobils:  Anti-infectives (From dmission, onwrd)   None     Subjective: Notes CP this AM. Epigstric in loction. Pressure.  Hs this from time to time, usully better with nitro.  Better now.  Objective: Vitls:   02/23/19 0516 02/23/19 0823 02/23/19 0951 02/23/19 1412  BP: 129/80 134/81 132/76 118/69  Pulse: 75 77 79 75  Resp: 18 16 12 16   Temp: (!) 97.5 F (36.4 C) 97.8 F (36.6 C) 97.8 F (36.6 C) 98 F (36.7 C)  TempSrc: **Note De-Identified vi Obfusction** Orl Orl Orl Orl  SpO2: 95% 97% 96% 100%  Weight:      Height:        Intke/Output Summry (Lst 24 hours) t 02/23/2019 1429 Lst dt filed t 02/23/2019 0951 Gross per 24 hour  Intke 2002.86 ml  Output 1250 ml  Net 752.86 ml   Filed Weights   02/21/19 2110 02/22/19 1701  Weight: 108.9 kg 106.6 kg    Exmintion:  Generl: No cute distress. Crdiovsculr: Hert sounds show   regulr rte, nd rhythm. Lungs: Cler to usculttion bilterlly Abdomen: Soft, TTP in epigstric region which reproduces his CP on exm Neurologicl: Alert nd oriented 3. Moves ll extremities 4. Crnil nerves II through XII grossly intct. Skin: Wrm nd dry. No rshes or lesions. Extremities: No clubbing or cynosis. No edem.  Psychitric: Mood nd ffect re norml. Insight nd judgment re pproprite.    Dt Reviewed: I hve personlly reviewed following lbs nd imging studies  CBC: Recent Lbs  Lb 02/21/19 2215 02/22/19 0214 02/23/19 0441  WBC 7.4 6.9 6.1  NEUTROABS 5.4  --   --   HGB 12.7* 13.1 11.5*  HCT 41.1 42.0 37.6*  MCV 90.9 91.1 90.8  PLT 321 282 255   Bsic Metbolic Pnel: Recent Lbs  Lb 02/22/19 0214 02/22/19 0549 02/22/19 0955 02/22/19 1351 02/23/19 0441  NA 135 138 140 140 136  K 5.1 4.4 3.8 4.1 4.0  CL 107 114* 116* 114* 111  CO2 8* 15* 17* 18* 21*  GLUCOSE 659* 320* 102* 130* 234*  BUN 26* 22* 19 18 15   CREATININE 1.68* 1.39* 1.09 1.06 0.99  CALCIUM 8.4* 8.4* 8.1* 8.0* 7.8*  MG  --   --   --   --  1.9   GFR: Estimted Cretinine Clernce: 95.5 mL/min (by C-G formul bsed on SCr of 0.99 mg/dL). Liver Function Tests: Recent Lbs  Lb 02/21/19 2215 02/23/19 0441  AST 23 22  ALT 17 12  ALKPHOS 135* 108  BILITOT 1.4* 0.4  PROT 6.9 5.4*  ALBUMIN 4.0 3.0*   Recent Lbs  Lb 02/23/19 0441  LIPASE 20   No results for input(s): AMMONIA in the lst 168 hours. Cogultion Profile: No results for input(s): INR, PROTIME in the lst 168 hours. Crdic Enzymes: Recent Lbs  Lb 02/23/19 0903  TROPONINI 0.06*   BNP (lst 3 results) No results for input(s): PROBNP in the lst 8760 hours. HbA1C: No results for input(s): HGBA1C in the lst 72 hours. CBG: Recent Lbs  Lb 02/22/19 1422 02/22/19 1614 02/22/19 2208 02/23/19 0737 02/23/19 1149  GLUCAP 149* 175* 156* 183* 119*   Lipid Profile: No results for input(s):  CHOL, HDL, LDLCALC, TRIG, CHOLHDL, LDLDIRECT in the lst 72 hours. Thyroid Function Tests: No results for input(s): TSH, T4TOTAL, FREET4, T3FREE, THYROIDAB in the lst 72 hours. Anemi Pnel: No results for input(s): VITAMINB12, FOLATE, FERRITIN, TIBC, IRON, RETICCTPCT in the lst 72 hours. Sepsis Lbs: No results for input(s): PROCALCITON, LATICACIDVEN in the lst 168 hours.  Recent Results (from the pst 240 hour(s))  MRSA PCR Screening     Sttus: None   Collection Time: 02/22/19  1:36 AM  Result Vlue Ref Rnge Sttus   MRSA by PCR NEGATIVE NEGATIVE Finl    Comment:        The GeneXpert MRSA Assy (FDA pproved for NASAL specimens only), is one component of  comprehensive MRSA coloniztion surveillnce progrm. It is not intended to dignose MRSA infection nor to guide or monitor tretment for MRSA infections. Performed at [facility], [address] [place], [place] [address]          Radiology Studies: Dg Chest Portable 1 View  Result Date: 02/21/2019 CLINICAL DATA:  Chest pain and dyspnea for 24 hours EXAM: PORTABLE CHEST 1 VIEW COMPARISON:  01/28/2019 FINDINGS: The heart size and mediastinal contours are within normal limits. Mild pulmonary hyperinflation, upper lobe predominant. Pulmonary consolidation or edema. The visualized skeletal structures are unremarkable. IMPRESSION: Mild upper lobe predominant hyperinflation of lungs. No active pulmonary disease. Electronically Signed   By: Tollie Eth M.D.   On: 02/21/2019 22:12        Scheduled Meds: . Chlorhexidine Gluconate Cloth  6 each Topical Daily  . enoxaparin (LOVENOX) injection  40 mg Subcutaneous Q24H  . gabapentin  200 mg Oral BID  . insulin aspart  0-15 Units Subcutaneous TID WC  . insulin aspart  8 Units Subcutaneous TID WC  . insulin detemir  30 Units Subcutaneous BID  . lisinopril  2.5 mg Oral Daily  . mouth rinse  15 mL Mouth Rinse BID  . metoprolol tartrate  12.5 mg Oral  BID  . pantoprazole  40 mg Oral Daily  . rosuvastatin  40 mg Oral Daily   Continuous Infusions: . insulin Stopped (02/22/19 1421)     LOS: 2 days    Time spent: over 30 min    Lacretia Nicks, MD [facility] Pager AMION  If 7PM-7AM, please contact night-coverage [email or web address] Password [facility] 02/23/2019, 2:29 PM

## 2019-02-23 NOTE — Progress Notes (Signed)
Pt has serial troponin I blood draw, result came back of 0.05. provider on call informed. Awaiting for new order. Will continue to monitor patient.

## 2019-02-24 ENCOUNTER — Inpatient Hospital Stay (HOSPITAL_COMMUNITY): Payer: Medicare HMO

## 2019-02-24 DIAGNOSIS — R7989 Other specified abnormal findings of blood chemistry: Secondary | ICD-10-CM

## 2019-02-24 DIAGNOSIS — I2510 Atherosclerotic heart disease of native coronary artery without angina pectoris: Secondary | ICD-10-CM

## 2019-02-24 LAB — CBC
HEMATOCRIT: 36.7 % — AB (ref 39.0–52.0)
Hemoglobin: 11.9 g/dL — ABNORMAL LOW (ref 13.0–17.0)
MCH: 28.3 pg (ref 26.0–34.0)
MCHC: 32.4 g/dL (ref 30.0–36.0)
MCV: 87.2 fL (ref 80.0–100.0)
NRBC: 0 % (ref 0.0–0.2)
Platelets: 240 10*3/uL (ref 150–400)
RBC: 4.21 MIL/uL — ABNORMAL LOW (ref 4.22–5.81)
RDW: 13.6 % (ref 11.5–15.5)
WBC: 5.5 10*3/uL (ref 4.0–10.5)

## 2019-02-24 LAB — COMPREHENSIVE METABOLIC PANEL
ALK PHOS: 106 U/L (ref 38–126)
ALT: 13 U/L (ref 0–44)
AST: 21 U/L (ref 15–41)
Albumin: 3.1 g/dL — ABNORMAL LOW (ref 3.5–5.0)
Anion gap: 7 (ref 5–15)
BUN: 13 mg/dL (ref 6–20)
CO2: 21 mmol/L — ABNORMAL LOW (ref 22–32)
Calcium: 8.1 mg/dL — ABNORMAL LOW (ref 8.9–10.3)
Chloride: 112 mmol/L — ABNORMAL HIGH (ref 98–111)
Creatinine, Ser: 0.88 mg/dL (ref 0.61–1.24)
GFR calc Af Amer: >60 mL/min (ref >60)
GFR calc non Af Amer: >60 mL/min (ref >60)
Glucose, Bld: 151 mg/dL — ABNORMAL HIGH (ref 70–99)
Potassium: 3.7 mmol/L (ref 3.5–5.1)
Sodium: 140 mmol/L (ref 135–145)
Total Bilirubin: 0.4 mg/dL (ref 0.3–1.2)
Total Protein: 5.6 g/dL — ABNORMAL LOW (ref 6.5–8.1)

## 2019-02-24 LAB — GLUCOSE, CAPILLARY
GLUCOSE-CAPILLARY: 250 mg/dL — AB (ref 70–99)
Glucose-Capillary: 120 mg/dL — ABNORMAL HIGH (ref 70–99)
Glucose-Capillary: 195 mg/dL — ABNORMAL HIGH (ref 70–99)
Glucose-Capillary: 183 mg/dL — ABNORMAL HIGH (ref 70–99)

## 2019-02-24 LAB — TROPONIN I: Troponin I: 0.06 ng/mL (ref <0.03)

## 2019-02-24 LAB — MAGNESIUM: Magnesium: 1.7 mg/dL (ref 1.7–2.4)

## 2019-02-24 MED ORDER — SODIUM CHLORIDE (PF) 0.9 % IJ SOLN
INTRAMUSCULAR | Status: AC
  Filled 2019-02-24: qty 50

## 2019-02-24 MED ORDER — LACTATED RINGERS IV SOLN: INTRAVENOUS | Status: AC

## 2019-02-24 MED ORDER — IOHEXOL 300 MG/ML  SOLN
100.0000 mL | Freq: Once | INTRAMUSCULAR | Status: AC | PRN
  Administered 2019-02-24: 100 mL via INTRAVENOUS

## 2019-02-24 MED ORDER — LISINOPRIL 5 MG PO TABS
5.0000 mg | ORAL_TABLET | Freq: Every day | ORAL | Status: DC
  Administered 2019-02-24 – 2019-02-25 (×2): 5 mg via ORAL
  Filled 2019-02-24 (×2): qty 1

## 2019-02-24 MED ORDER — LACTATED RINGERS IV SOLN
INTRAVENOUS | Status: AC
  Administered 2019-02-24 (×2): via INTRAVENOUS

## 2019-02-24 MED ORDER — INSULIN ASPART 100 UNIT/ML ~~LOC~~ SOLN
0.0000 [IU] | Freq: Three times a day (TID) | SUBCUTANEOUS | Status: DC
  Administered 2019-02-25: 5 [IU] via SUBCUTANEOUS

## 2019-02-24 MED ORDER — INSULIN ASPART 100 UNIT/ML ~~LOC~~ SOLN: 0.0000 [IU] | Freq: Every day | SUBCUTANEOUS | Status: DC

## 2019-02-24 MED ORDER — IOHEXOL 300 MG/ML  SOLN: 15.0000 mL | Freq: Once | INTRAMUSCULAR | Status: AC | PRN

## 2019-02-24 MED ORDER — INSULIN DETEMIR 100 UNIT/ML ~~LOC~~ SOLN
20.0000 [IU] | Freq: Two times a day (BID) | SUBCUTANEOUS | Status: DC
  Administered 2019-02-24 – 2019-02-25 (×2): 20 [IU] via SUBCUTANEOUS
  Filled 2019-02-24 (×3): qty 0.2

## 2019-02-24 NOTE — Care Management Important Message (Signed)
Important Message  Patient Details  Name: [name] MRN: [id number] Date of Birth: 11/05/1956   Medicare Important Message Given:  Yes    Caren Macadam 02/24/2019, 10:36 AMImportant Message  Patient Details  Name: [name] [name] MRN: [id number] Date of Birth: 1956/07/28   Medicare Important Message Given:  Yes    Caren Macadam 02/24/2019, 10:35 AM

## 2019-02-24 NOTE — Progress Notes (Signed)
PROGRESS NOTE    [name] [name]  EBR:[id number] DOB: 06/05/1956 DOA: 02/21/2019 PCP: Sherren Mocha, MD   Brief Narrative:   [name] [name] is [name] [name] 63 y.o. male with medical history significant of insulin-dependent diabetes, hypertension, coronary artery disease, acute kidney injury, GERD, depression, Barrett's esophagus who presented with some shortness of breath and hyperglycemia.  Patient is on insulin and has been compliant with his insulin.  He started having symptoms this morning of nausea and some shortness of breath.  He later found his sugar to be markedly elevated.  Patient decided to come to the emergency room for evaluation.  He was found to have [name] blood sugar of more than 800 elevated anion gap as well as ketonuria.  Patient is being admitted with diabetic ketoacidosis.  He denied any abdominal pain or active diarrhea.  Denied any vomiting only nausea.  Patient did have diarrhea yesterday which was short lived.  He has had previous DKA's.  Assessment & Plan:   Principal Problem:   DKA (diabetic ketoacidoses) (HCC) Active Problems:   GERD (gastroesophageal reflux disease)   Peripheral neuropathy   CAD (coronary artery disease)   Hyperlipidemia   AKI (acute kidney injury) (HCC)   #1 diabetic ketoacidosis:  +ketones and AG on admission Unclear etiology.  Pt notes compliance with home regimen. A1c last month is 10 Takes 45 units levemir in AM and 40 at night as well as 15 units short acting with breakfast and dinner.  AG closed this morning.  Pt has NAGMA likely 2/2 NS resuscitation. Will start levemir at 30 units BID and 4 units mealtime insulin. Continue to monitor blood sugars BG appropriate this AM  # Atypical Chest Pain   Elevated Troponin: mildly elevated troponin with atypical CP.  He notes he has this pain occasionally, describes as pressure in epigastric region which gets better with nitro.  Pain was reproducible with palpation to his epigastric region.  Pt with hx NSTEMI in  2015 with DES to RCA. EKG showed NSR and appeared similar to priors Gave full dose ASA.  Continue metoprolol, lisinopril, and statin. Nitro prn Troponins relatively flat.  Appreciate cardiology input.  Planning for myoview tomorrow AM.  # Abdominal Pain: pt with epigastric/abdominal pain on exam.  Tolerating PO.  Negative lipase and LFT's.  Will follow with CT abdomen pelvis given lack of improvement.  # NAGMA: likely related to NS resuscitation, improving  #2 diabetic neuropathy: Continue with his pain management from home (gabapentin, norco).  # Hypertension: continue lisinopril (increased to 5 mg by cards), metoprolol  #3 coronary artery disease: As noted above.  #4 hyperlipidemia: Continue with statin.  #5 acute kidney injury: Aggressively hydrate.  Improved with IVF.  Continue ACEi as ordered.  #6 GERD: Continue with PPIs  DVT prophylaxis: lovenox Code Status: full  Family Communication: none at bedside Disposition Plan: pending eval of atypical CP above and CT abdomen/pelvis   Consultants:   None  Procedures:   none  Antimicrobials:  Anti-infectives (From admission, onward)   None     Subjective: CP better. Still with abdominal pain.  Objective: Vitals:   02/23/19 2210 02/24/19 0122 02/24/19 0549 02/24/19 1009  BP: (!) 141/83 (!) 147/86 (!) 150/90 (!) 143/78  Pulse: 74 79 79 80  Resp:  15 16 16   Temp:  98.1 F (36.7 C) 98.3 F (36.8 C) 98.1 F (36.7 C)  TempSrc:  Oral Oral Oral  SpO2: 99% 96% 96% 98%  Weight:  Height:        Intake/Output Summary (Last 24 hours) at 02/24/2019 1016 Last data filed at 02/24/2019 0732 Gross per 24 hour  Intake 360 ml  Output 1500 ml  Net -1140 ml   Filed Weights   02/21/19 2110 02/22/19 1701  Weight: 108.9 kg 106.6 kg    Examination:  General: No acute distress. Cardiovascular: Heart sounds show [name] regular rate, and rhythm.  Lungs: Clear to auscultation bilaterally  Abdomen: Soft, nontender,  nondistended  Neurological: Alert and oriented 3. Moves all extremities 4. Cranial nerves II through XII grossly intact. Skin: Warm and dry. No rashes or lesions. Extremities: No clubbing or cyanosis. No edema. Psychiatric: Mood and affect are normal. Insight and judgment are appropriate.   Data Reviewed: I have personally reviewed following labs and imaging studies  CBC: Recent Labs  Lab 02/21/19 2215 02/22/19 0214 02/23/19 0441 02/24/19 0429  WBC 7.4 6.9 6.1 5.5  NEUTROABS 5.4  --   --   --   HGB 12.7* 13.1 11.5* 11.9*  HCT 41.1 42.0 37.6* 36.7*  MCV 90.9 91.1 90.8 87.2  PLT 321 282 255 240   Basic Metabolic Panel: Recent Labs  Lab 02/22/19 0549 02/22/19 0955 02/22/19 1351 02/23/19 0441 02/24/19 0429  NA 138 140 140 136 140  K 4.4 3.8 4.1 4.0 3.7  CL 114* 116* 114* 111 112*  CO2 15* 17* 18* 21* 21*  GLUCOSE 320* 102* 130* 234* 151*  BUN 22* 19 18 15 13   CREATININE 1.39* 1.09 1.06 0.99 0.88  CALCIUM 8.4* 8.1* 8.0* 7.8* 8.1*  MG  --   --   --  1.9 1.7   GFR: Estimated Creatinine Clearance: 107.4 mL/min (by C-G formula based on SCr of 0.88 mg/dL). Liver Function Tests: Recent Labs  Lab 02/21/19 2215 02/23/19 0441 02/24/19 0429  AST 23 22 21   ALT 17 12 13   ALKPHOS 135* 108 106  BILITOT 1.4* 0.4 0.4  PROT 6.9 5.4* 5.6*  ALBUMIN 4.0 3.0* 3.1*   Recent Labs  Lab 02/23/19 0441  LIPASE 20   No results for input(s): AMMONIA in the last 168 hours. Coagulation Profile: No results for input(s): INR, PROTIME in the last 168 hours. Cardiac Enzymes: Recent Labs  Lab 02/23/19 0903 02/23/19 1441 02/23/19 1947 02/24/19 0429  TROPONINI 0.06* 0.07* 0.05* 0.06*   BNP (last 3 results) No results for input(s): PROBNP in the last 8760 hours. HbA1C: No results for input(s): HGBA1C in the last 72 hours. CBG: Recent Labs  Lab 02/23/19 1149 02/23/19 1625 02/23/19 2108 02/23/19 2215 02/24/19 0731  GLUCAP 119* 81 104* 122* 120*   Lipid Profile: No results  for input(s): CHOL, HDL, LDLCALC, TRIG, CHOLHDL, LDLDIRECT in the last 72 hours. Thyroid Function Tests: No results for input(s): TSH, T4TOTAL, FREET4, T3FREE, THYROIDAB in the last 72 hours. Anemia Panel: No results for input(s): VITAMINB12, FOLATE, FERRITIN, TIBC, IRON, RETICCTPCT in the last 72 hours. Sepsis Labs: No results for input(s): PROCALCITON, LATICACIDVEN in the last 168 hours.  Recent Results (from the past 240 hour(s))  MRSA PCR Screening     Status: None   Collection Time: 02/22/19  1:36 AM  Result Value Ref Range Status   MRSA by PCR NEGATIVE NEGATIVE Final    Comment:        The GeneXpert MRSA Assay (FDA approved for NASAL specimens only), is one component of Benno Brensinger comprehensive MRSA colonization surveillance program. It is not intended to diagnose MRSA infection nor to guide or monitor  treatment for MRSA infections. Performed at [facility], [address] [place], [place] [address]          Radiology Studies: No results found.      Scheduled Meds:  aspirin EC  81 mg Oral Daily   Chlorhexidine Gluconate Cloth  6 each Topical Daily   enoxaparin (LOVENOX) injection  40 mg Subcutaneous Q24H   gabapentin  200 mg Oral BID   insulin aspart  0-15 Units Subcutaneous TID WC   insulin aspart  4 Units Subcutaneous TID WC   insulin detemir  30 Units Subcutaneous BID   lisinopril  5 mg Oral Daily   mouth rinse  15 mL Mouth Rinse BID   metoprolol tartrate  12.5 mg Oral BID   pantoprazole  40 mg Oral Daily   rosuvastatin  40 mg Oral Daily   Continuous Infusions:  insulin Stopped (02/22/19 1421)   lactated ringers       LOS: 3 days    Time spent: over 30 min    Lacretia Nicks, MD [facility] Pager AMION  If 7PM-7AM, please contact night-coverage [email or web address] Password [facility] 02/24/2019, 10:16 AM

## 2019-02-25 ENCOUNTER — Ambulatory Visit (HOSPITAL_COMMUNITY)
Admission: RE | Admit: 2019-02-25 | Discharge: 2019-02-25 | Disposition: A | Discharge status: Home / Self Care | Payer: Medicare HMO | Source: Ambulatory Visit | Attending: Cardiology | Admitting: Cardiology

## 2019-02-25 DIAGNOSIS — R079 Chest pain, unspecified: Secondary | ICD-10-CM | POA: Insufficient documentation

## 2019-02-25 DIAGNOSIS — R0789 Other chest pain: Secondary | ICD-10-CM | POA: Diagnosis not present

## 2019-02-25 LAB — COMPREHENSIVE METABOLIC PANEL
ALT: 12 U/L (ref 0–44)
AST: 18 U/L (ref 15–41)
Albumin: 3.4 g/dL — ABNORMAL LOW (ref 3.5–5.0)
Alkaline Phosphatase: 114 U/L (ref 38–126)
Anion gap: 8 (ref 5–15)
BUN: 12 mg/dL (ref 6–20)
CO2: 24 mmol/L (ref 22–32)
Calcium: 8.5 mg/dL — ABNORMAL LOW (ref 8.9–10.3)
Chloride: 109 mmol/L (ref 98–111)
Creatinine, Ser: 0.75 mg/dL (ref 0.61–1.24)
GFR calc Af Amer: >60 mL/min (ref >60)
GFR calc non Af Amer: >60 mL/min (ref >60)
Glucose, Bld: 113 mg/dL — ABNORMAL HIGH (ref 70–99)
Potassium: 3.5 mmol/L (ref 3.5–5.1)
Sodium: 141 mmol/L (ref 135–145)
Total Bilirubin: 0.5 mg/dL (ref 0.3–1.2)
Total Protein: 6.1 g/dL — ABNORMAL LOW (ref 6.5–8.1)

## 2019-02-25 LAB — CBC
HCT: 39.9 % (ref 39.0–52.0)
Hemoglobin: 12.7 g/dL — ABNORMAL LOW (ref 13.0–17.0)
MCH: 28 pg (ref 26.0–34.0)
MCHC: 31.8 g/dL (ref 30.0–36.0)
MCV: 88.1 fL (ref 80.0–100.0)
Platelets: 259 10*3/uL (ref 150–400)
RBC: 4.53 MIL/uL (ref 4.22–5.81)
RDW: 13.4 % (ref 11.5–15.5)
WBC: 3.9 10*3/uL — ABNORMAL LOW (ref 4.0–10.5)
nRBC: 0 % (ref 0.0–0.2)

## 2019-02-25 LAB — GLUCOSE, CAPILLARY
GLUCOSE-CAPILLARY: 226 mg/dL — AB (ref 70–99)
Glucose-Capillary: 97 mg/dL (ref 70–99)
Glucose-Capillary: 105 mg/dL — ABNORMAL HIGH (ref 70–99)

## 2019-02-25 LAB — NM MYOCAR MULTI W/SPECT W/WALL MOTION / EF
CSEPPHR: 116 {beats}/min
Estimated workload: 1 METS
Exercise duration (min): 5 min
Exercise duration (sec): 14 s
MPHR: 160 {beats}/min
Percent HR: 72 %
Rest HR: 71 {beats}/min

## 2019-02-25 LAB — MAGNESIUM: Magnesium: 1.9 mg/dL (ref 1.7–2.4)

## 2019-02-25 MED ORDER — INSULIN ASPART 100 UNIT/ML ~~LOC~~ SOLN: 8.0000 [IU] | Freq: Three times a day (TID) | SUBCUTANEOUS | Status: DC

## 2019-02-25 MED ORDER — REGADENOSON 0.4 MG/5ML IV SOLN
INTRAVENOUS | Status: AC
  Administered 2019-02-25: 0.4 mg via INTRAVENOUS
  Filled 2019-02-25: qty 5

## 2019-02-25 MED ORDER — REGADENOSON 0.4 MG/5ML IV SOLN
0.4000 mg | Freq: Once | INTRAVENOUS | Status: AC
  Administered 2019-02-25: 0.4 mg via INTRAVENOUS

## 2019-02-25 MED ORDER — INSULIN DETEMIR 100 UNIT/ML ~~LOC~~ SOLN: 30.0000 [IU] | Freq: Two times a day (BID) | SUBCUTANEOUS | 0 refills | Status: DC

## 2019-02-25 MED ORDER — TECHNETIUM TC 99M TETROFOSMIN IV KIT
30.0000 | PACK | Freq: Once | INTRAVENOUS | Status: AC | PRN
  Administered 2019-02-25: 30 via INTRAVENOUS

## 2019-02-25 MED ORDER — ONDANSETRON HCL 4 MG/2ML IJ SOLN: 4.0000 mg | Freq: Four times a day (QID) | INTRAMUSCULAR | Status: DC | PRN

## 2019-02-25 MED ORDER — LISINOPRIL 5 MG PO TABS: 5.0000 mg | ORAL_TABLET | Freq: Every day | ORAL | 0 refills | Status: DC

## 2019-02-25 MED ORDER — TECHNETIUM TC 99M TETROFOSMIN IV KIT
10.0000 | PACK | Freq: Once | INTRAVENOUS | Status: AC | PRN
  Administered 2019-02-25: 10 via INTRAVENOUS

## 2019-02-25 MED ORDER — INSULIN DETEMIR 100 UNIT/ML ~~LOC~~ SOLN
30.0000 [IU] | Freq: Two times a day (BID) | SUBCUTANEOUS | Status: DC
  Filled 2019-02-25: qty 0.3

## 2019-02-25 NOTE — Progress Notes (Signed)
Test completed, pt tolerated exam with minimal distress, pt claimed nausea, gave diet gingerale after exam completed.  Pt states he is feeling better at this time.

## 2019-02-25 NOTE — Progress Notes (Addendum)
Progress Note  Patient Name: [name] [name] Date of Encounter: 02/25/2019  Primary Cardiologist:  Lance Muss, MD  Subjective   CP is a 3/10. Has been continuous for about a week.   Inpatient Medications    Scheduled Meds:  aspirin EC  81 mg Oral Daily   Chlorhexidine Gluconate Cloth  6 each Topical Daily   enoxaparin (LOVENOX) injection  40 mg Subcutaneous Q24H   gabapentin  200 mg Oral BID   insulin aspart  0-15 Units Subcutaneous TID WC   insulin aspart  0-5 Units Subcutaneous QHS   insulin aspart  4 Units Subcutaneous TID WC   insulin detemir  20 Units Subcutaneous BID   lisinopril  5 mg Oral Daily   mouth rinse  15 mL Mouth Rinse BID   metoprolol tartrate  12.5 mg Oral BID   pantoprazole  40 mg Oral Daily   rosuvastatin  40 mg Oral Daily   Continuous Infusions:  lactated ringers 100 mL/hr at 02/25/19 0009   PRN Meds: HYDROcodone-acetaminophen, methocarbamol, nitroGLYCERIN   Vital Signs    Vitals:   02/24/19 1009 02/24/19 1410 02/24/19 2136 02/25/19 0630  BP: (!) 143/78 (!) 142/78 135/79 (!) 153/95  Pulse: 80 77 74 75  Resp: 16 17 18 18   Temp: 98.1 F (36.7 C) 98.1 F (36.7 C) 97.7 F (36.5 C) 97.7 F (36.5 C)  TempSrc: Oral Oral Oral Oral  SpO2: 98% 98% 97% 95%  Weight:      Height:        Intake/Output Summary (Last 24 hours) at 02/25/2019 1004 Last data filed at 02/25/2019 0700 Gross per 24 hour  Intake 2183.94 ml  Output --  Net 2183.94 ml   Filed Weights   02/21/19 2110 02/22/19 1701  Weight: 108.9 kg 106.6 kg    Telemetry    SR, seen in nuc med - Personally Reviewed  ECG    03/28, SR, no acute ischemic changes - Personally Reviewed  Physical Exam   General: Well developed, well nourished, male appearing in no acute distress. Head: Normocephalic, atraumatic.  Neck: Supple without bruits, JVD not elevated. Lungs:  Resp regular and unlabored, CTA. Heart: RRR, S1, S2, no S3, S4, or murmur; no rub. Abdomen: Soft,  non-tender, non-distended with normoactive bowel sounds. No hepatomegaly. No rebound/guarding. No obvious abdominal masses. Extremities: No clubbing, cyanosis, no edema. Distal pedal pulses are 2+ bilaterally. Neuro: Alert and oriented X 3. Moves all extremities spontaneously. Psych: Normal affect.  Labs    Hematology Recent Labs  Lab 02/23/19 0441 02/24/19 0429 02/25/19 0345  WBC 6.1 5.5 3.9*  RBC 4.14* 4.21* 4.53  HGB 11.5* 11.9* 12.7*  HCT 37.6* 36.7* 39.9  MCV 90.8 87.2 88.1  MCH 27.8 28.3 28.0  MCHC 30.6 32.4 31.8  RDW 13.7 13.6 13.4  PLT 255 240 259    Chemistry Recent Labs  Lab 02/23/19 0441 02/24/19 0429 02/25/19 0345  NA 136 140 141  K 4.0 3.7 3.5  CL 111 112* 109  CO2 21* 21* 24  GLUCOSE 234* 151* 113*  BUN 15 13 12   CREATININE 0.99 0.88 0.75  CALCIUM 7.8* 8.1* 8.5*  PROT 5.4* 5.6* 6.1*  ALBUMIN 3.0* 3.1* 3.4*  AST 22 21 18   ALT 12 13 12   ALKPHOS 108 106 114  BILITOT 0.4 0.4 0.5  GFRNONAA >60 >60 >60  GFRAA >60 >60 >60  ANIONGAP 4* 7 8     Cardiac Enzymes Recent Labs  Lab 02/23/19 0903 02/23/19 1441 02/23/19  1947 02/24/19 0429  TROPONINI 0.06* 0.07* 0.05* 0.06*   No results for input(s): TROPIPOC in the last 168 hours.    Radiology    Ct Abdomen Pelvis W Contrast  Result Date: 02/24/2019 CLINICAL DATA:  Abdominal pain. EXAM: CT ABDOMEN AND PELVIS WITH CONTRAST TECHNIQUE: Multidetector CT imaging of the abdomen and pelvis was performed using the standard protocol following bolus administration of intravenous contrast. CONTRAST:  OMNIPAQUE IOHEXOL 300 MG/ML  SOLN COMPARISON:  09/29/2017 FINDINGS: Lower chest: 4 mm left lower lobe pulmonary nodule identified on image 20/series 6. Minimal dependent atelectasis in the lung bases. Hepatobiliary: No suspicious focal abnormality within the liver parenchyma. Gallbladder decompressed. No intrahepatic or extrahepatic biliary dilation. Pancreas: No focal mass lesion. No dilatation of the main duct.  No intraparenchymal cyst. No peripancreatic edema. Spleen: No splenomegaly. No focal mass lesion. Adrenals/Urinary Tract: No adrenal nodule or mass. Kidneys unremarkable. No evidence for hydroureter. The urinary bladder appears normal for the degree of distention. Stomach/Bowel: Tiny hiatal hernia. Stomach otherwise unremarkable. Duodenum is normally positioned as is the ligament of Treitz. No small bowel wall thickening. No small bowel dilatation. The terminal ileum is normal. The appendix is normal. No gross colonic mass. No colonic wall thickening. Vascular/Lymphatic: There is abdominal aortic atherosclerosis without aneurysm. There is no gastrohepatic or hepatoduodenal ligament lymphadenopathy. No intraperitoneal or retroperitoneal lymphadenopathy. No pelvic sidewall lymphadenopathy. Reproductive: The prostate gland and seminal vesicles are unremarkable. Other: No intraperitoneal free fluid. Musculoskeletal: No worrisome lytic or sclerotic osseous abnormality. IMPRESSION: 1. No acute findings in the abdomen or pelvis. Specifically, no findings to explain the patient's history of abdominal pain. 2. 4 mm left lower lobe pulmonary nodule. No follow-up needed if patient is low-risk. Non-contrast chest CT can be considered in 12 months if patient is high-risk. This recommendation follows the consensus statement: Guidelines for Management of Incidental Pulmonary Nodules Detected on CT Images: From the Fleischner Society 2017; Radiology 2017; 284:228-243. 3.  Aortic Atherosclerois (ICD10-170.0) Electronically Signed   By: Kennith Center M.D.   On: 02/24/2019 13:08   Dg Chest Portable 1 View  Result Date: 02/21/2019 CLINICAL DATA:  Chest pain and dyspnea for 24 hours EXAM: PORTABLE CHEST 1 VIEW COMPARISON:  01/28/2019 FINDINGS: The heart size and mediastinal contours are within normal limits. Mild pulmonary hyperinflation, upper lobe predominant. Pulmonary consolidation or edema. The visualized skeletal structures  are unremarkable. IMPRESSION: Mild upper lobe predominant hyperinflation of lungs. No active pulmonary disease. Electronically Signed   By: Tollie Eth M.D.   On: 02/21/2019 22:12     Cardiac Studies   ECHO:    Patient Profile     63 y.o. male w/ hx CAD (NSTEMI in 2015 - DES to RCA), LVEF 50-65%, insulin dependent DM2, HTN, HLD and peripheral neuropathy who was admitted 03/26 w/ DKA and chest pain.   Assessment & Plan    1. Chest pain:  - Ez mildly elevated, more c/w acute illness than ACS - CP has been continuous for a week. - ECG not acute - Ok for MV today, f/u on results. No further cardiac workup if low risk.  Otherwise, per IM Principal Problem:   DKA (diabetic ketoacidoses) (HCC) Active Problems:   GERD (gastroesophageal reflux disease)   Peripheral neuropathy   CAD (coronary artery disease)   Hyperlipidemia   AKI (acute kidney injury) (HCC)  Signed, Theodore Demark , PA-C 10:04 AM 02/25/2019 Pager: [phone]  I have seen, examined the patient, and reviewed the above assessment and plan.  Changes to above are made where necessary.  On exam, normal WOB, alert, NAD.  Symptoms have primarily atypical features.  Will proceed with myoview.  If low risk, medical therapy is advised.  Co Sign: Hillis Range, MD 02/25/2019 10:22 AM

## 2019-02-25 NOTE — Progress Notes (Signed)
   [name] presented for a nuclear stress test today.  No immediate complications.  Stress imaging is pending at this time.  Preliminary EKG findings may be listed in the chart, but the stress test result will not be finalized until perfusion imaging is complete.  1 day study, cardiology to read.  Theodore Demark, PA-C 02/25/2019, 10:28 AM    ADDENDUM: Preliminary report by Dr. Delton See, Myoview is low risk.  No further cardiac work-up needed.  Will advise Dr. Nevada Crane, PA-C 02/25/2019 12:29 PM Beeper [phone]

## 2019-02-25 NOTE — Progress Notes (Signed)
Reviewed discharge paperwork, follow up appointments, & current medication regimen with patient. Advised where to pick up prescriptions. Patient declined wheelchair, walked down & was was escorted by NT.

## 2019-02-25 NOTE — Discharge Summary (Signed)
Physician Discharge Summary  [name] [name] ZYT:[id number] DOB: 11-12-1956 DOA: 02/21/2019  PCP: Sherren Mocha, MD  Admit date: 02/21/2019 Discharge date: 02/25/2019  Time spent: 40 minutes  Recommendations for Outpatient Follow-up:  1. Follow up outpatient CBC/CMP 2. Follow up with cardiology outpatient regarding mildly decreased EF 3. Follow up with PCP regarding pulmonary nodule and need for repeat imaging 4. Follow up insulin regimen with PCP    Discharge Diagnoses:  Principal Problem:   DKA (diabetic ketoacidoses) (HCC) Active Problems:   GERD (gastroesophageal reflux disease)   Peripheral neuropathy   CAD (coronary artery disease)   Hyperlipidemia   AKI (acute kidney injury) (HCC)   Chest pain of uncertain etiology   Discharge Condition: stable  Diet recommendation: heart healthy   Filed Weights   02/21/19 2110 02/22/19 1701  Weight: 108.9 kg 106.6 kg    History of present illness:  [name] [name] 62 y.o.malewith medical history significant ofinsulin-dependent diabetes, hypertension, coronary artery disease, acute kidney injury, GERD, depression, Barrett's esophagus who presented with some shortness of breath and hyperglycemia. Patient is on insulin and has been compliant with his insulin. He started having symptoms this morning of nausea and some shortness of breath. He later found his sugar to be markedly elevated. Patient decided to come to the emergency room for evaluation. He was found to have [name] [name] blood sugar of more than 800 elevated anion gap as well as ketonuria. Patient is being admitted with diabetic ketoacidosis. He denied any abdominal pain or active diarrhea. Denied any vomiting only nausea. Patient did have diarrhea yesterday which was short lived. He has had previous DKA's.  His DKA improved on an insulin gtt and he was transitioned to his home regimen.  He c/o CP and had mildly elevated troponin.  Had low risk stress test.  CT abdomen pelvis also  obtained due to abdominal pain without significant abnormalties.  Hospital Course:  #1 diabetic ketoacidosis:  +ketones and AG on admission Unclear etiology.  Pt notes compliance with home regimen. A1c last month is 10 Takes 45 units levemir in AM and 40 at night as well as 15 units short acting with breakfast and dinner.  Resume levemir at 30 units BID at discharge, increase as tolerated.  Resume SSI with meals.  # Atypical Chest Pain  Elevated Troponin: mildly elevated troponin with atypical CP.  He notes he has this pain occasionally, describes as pressure in epigastric region which gets better with nitro.  Pain was reproducible with palpation to his epigastric region.  Pt with hx NSTEMI in 2015 with DES to RCA. EKG showed NSR and appeared similar to priors Gave full dose ASA.  Continue metoprolol, lisinopril, and statin. Nitro prn Troponins relatively flat.  Appreciate cardiology input.  Myoview with low risk study.  Mildly decreased EF.  Discussed with cards, planning for outpatient follow up.  Discussed with pt.  # Abdominal Pain: pt with epigastric/abdominal pain on exam.  Tolerating PO.  Negative lipase and LFT's.   CT abdomen/pelvis without acute findings  # 4 mm L lower lobe pulmonary nodule: follow up with outpatient provider, discussed with pt.  # NAGMA: likely related to NS resuscitation, improving  #2 diabetic neuropathy:Continue with his pain management from home (gabapentin, norco).  # Hypertension: continue lisinopril (increased to 5 mg by cards), metoprolol  #3 coronary artery disease: As noted above.  #4 hyperlipidemia:Continue with statin.  #5 acute kidney injury:Aggressively hydrate.  Improved with IVF.  Continue ACEi as ordered.  #6  GERD: Continue with PPIs  Procedures:  none  Consultations:  cardiology  Discharge Exam: Vitals:   02/24/19 2136 02/25/19 0630  BP: 135/79 (!) 153/95  Pulse: 74 75  Resp: 18 18  Temp: 97.7 F (36.5 C)  97.7 F (36.5 C)  SpO2: 97% 95%   Feels ok. Discussed plan for discharge and instructions.  General: No acute distress. Cardiovascular: Heart sounds show [name] [name] regular rate, and rhythm.  Lungs: Clear to auscultation bilaterally  Abdomen: Soft, nontender, nondistended  Neurological: Alert and oriented 3. Moves all extremities 4 Skin: Warm and dry. No rashes or lesions. Extremities: No clubbing or cyanosis. No edema.  Discharge Instructions   Discharge Instructions    Call MD for:  difficulty breathing, headache or visual disturbances   Complete by:  As directed    Call MD for:  extreme fatigue   Complete by:  As directed    Call MD for:  hives   Complete by:  As directed    Call MD for:  persistant dizziness or light-headedness   Complete by:  As directed    Call MD for:  persistant nausea and vomiting   Complete by:  As directed    Call MD for:  redness, tenderness, or signs of infection (pain, swelling, redness, odor or green/yellow discharge around incision site)   Complete by:  As directed    Call MD for:  severe uncontrolled pain   Complete by:  As directed    Call MD for:  temperature >100.4   Complete by:  As directed    Diet - low sodium heart healthy   Complete by:  As directed    Discharge instructions   Complete by:  As directed    You were seen for high blood sugars.  This has improved.  Continue your home regimen (start with levemir 30 units twice daily) and your mealtime insulin (sliding scale).  If your blood sugars run high, you can gradually increase back to your previous dose (45 units in the morning and 40 in the evening).  Please follow up with your outpatient PCP.    Please follow up with cardiology as an outpatient.   You have [name] [name] pulmonary nodule that should be followed up as an outpatient.  Return for new, recurrent, or worsening symptoms.  Please ask your PCP to request records from this hospitalization so they know what was done and what the  next steps will be.   Increase activity slowly   Complete by:  As directed      Allergies as of 02/25/2019   No Known Allergies     Medication List    TAKE these medications   EQ Aspirin Adult Low Dose 81 MG EC tablet Generic drug:  aspirin TAKE ONE TABLET BY MOUTH ONCE DAILY What changed:  how much to take   gabapentin 100 MG capsule Commonly known as:  NEURONTIN TAKE 2 CAPSULES BY MOUTH EVERY MORNING AND 2 CAPSULES BY MOUTH  AT BEDTIME What changed:    how much to take  how to take this  when to take this   HYDROcodone-acetaminophen 7.5-325 MG tablet Commonly known as:  NORCO Take 1 tablet by mouth every 6 (six) hours as needed for moderate pain.   insulin aspart 100 UNIT/ML injection Commonly known as:  novoLOG Inject 0-15 Units into the skin 3 (three) times daily with meals for 30 days. For glucose 121 to 150 use 2 units, for 151 to 200 use 4 units, for  201-250 use 7 units, for 251 to 300 use 9 units, for 301 to 350 use 12 units, for 351 or greater use 15 units.   insulin detemir 100 UNIT/ML injection Commonly known as:  LEVEMIR Inject 0.3 mLs (30 Units total) into the skin 2 (two) times daily for 30 days. What changed:  how much to take   lisinopril 5 MG tablet Commonly known as:  PRINIVIL,ZESTRIL Take 1 tablet (5 mg total) by mouth daily for 30 days. Start taking on:  February 26, 2019 What changed:    medication strength  See the new instructions.   methocarbamol 500 MG tablet Commonly known as:  ROBAXIN Take 500 mg by mouth 2 (two) times daily as needed for muscle spasms.   metoprolol tartrate 25 MG tablet Commonly known as:  LOPRESSOR TAKE 1/2 TABLET(12.5 MG) BY MOUTH TWICE DAILY What changed:  See the new instructions.   nitroGLYCERIN 0.4 MG SL tablet Commonly known as:  NITROSTAT Place 1 tablet (0.4 mg total) under the tongue every 5 (five) minutes as needed for chest pain (up to 3 doses).   pantoprazole 40 MG tablet Commonly known as:   PROTONIX TAKE 1 TABLET BY MOUTH EVERY DAY   rosuvastatin 40 MG tablet Commonly known as:  CRESTOR TAKE 1 TABLET(40 MG) BY MOUTH DAILY What changed:  See the new instructions.      No Known Allergies    The results of significant diagnostics from this hospitalization (including imaging, microbiology, ancillary and laboratory) are listed below for reference.    Significant Diagnostic Studies: Dg Chest 2 View  Result Date: 01/28/2019 CLINICAL DATA:  Nonproductive cough for 1 week EXAM: CHEST - 2 VIEW COMPARISON:  01/14/2019 FINDINGS: The heart size and mediastinal contours are within normal limits. Both lungs are clear. The visualized skeletal structures are unremarkable. IMPRESSION: No active cardiopulmonary disease. Electronically Signed   By: Alcide Clever M.D.   On: 01/28/2019 10:38   Ct Abdomen Pelvis W Contrast  Result Date: 02/24/2019 CLINICAL DATA:  Abdominal pain. EXAM: CT ABDOMEN AND PELVIS WITH CONTRAST TECHNIQUE: Multidetector CT imaging of the abdomen and pelvis was performed using the standard protocol following bolus administration of intravenous contrast. CONTRAST:  OMNIPAQUE IOHEXOL 300 MG/ML  SOLN COMPARISON:  09/29/2017 FINDINGS: Lower chest: 4 mm left lower lobe pulmonary nodule identified on image 20/series 6. Minimal dependent atelectasis in the lung bases. Hepatobiliary: No suspicious focal abnormality within the liver parenchyma. Gallbladder decompressed. No intrahepatic or extrahepatic biliary dilation. Pancreas: No focal mass lesion. No dilatation of the main duct. No intraparenchymal cyst. No peripancreatic edema. Spleen: No splenomegaly. No focal mass lesion. Adrenals/Urinary Tract: No adrenal nodule or mass. Kidneys unremarkable. No evidence for hydroureter. The urinary bladder appears normal for the degree of distention. Stomach/Bowel: Tiny hiatal hernia. Stomach otherwise unremarkable. Duodenum is normally positioned as is the ligament of Treitz. No small bowel  wall thickening. No small bowel dilatation. The terminal ileum is normal. The appendix is normal. No gross colonic mass. No colonic wall thickening. Vascular/Lymphatic: There is abdominal aortic atherosclerosis without aneurysm. There is no gastrohepatic or hepatoduodenal ligament lymphadenopathy. No intraperitoneal or retroperitoneal lymphadenopathy. No pelvic sidewall lymphadenopathy. Reproductive: The prostate gland and seminal vesicles are unremarkable. Other: No intraperitoneal free fluid. Musculoskeletal: No worrisome lytic or sclerotic osseous abnormality. IMPRESSION: 1. No acute findings in the abdomen or pelvis. Specifically, no findings to explain the patient's history of abdominal pain. 2. 4 mm left lower lobe pulmonary nodule. No follow-up needed if patient is  low-risk. Non-contrast chest CT can be considered in 12 months if patient is high-risk. This recommendation follows the consensus statement: Guidelines for Management of Incidental Pulmonary Nodules Detected on CT Images: From the Fleischner Society 2017; Radiology 2017; 284:228-243. 3.  Aortic Atherosclerois (ICD10-170.0) Electronically Signed   By: Kennith Center M.D.   On: 02/24/2019 13:08   Nm Myocar Multi W/spect W/wall Motion / Ef  Result Date: 02/25/2019  There was no ST segment deviation noted during stress.  No T wave inversion was noted during stress.  The study is normal.  This is Salinda Snedeker low risk study.  The left ventricular ejection fraction is mildly decreased (45-54%).  Normal pharmacologic nuclear stress test with no evidence for prior infarct or ischemia.   Dg Chest Portable 1 View  Result Date: 02/21/2019 CLINICAL DATA:  Chest pain and dyspnea for 24 hours EXAM: PORTABLE CHEST 1 VIEW COMPARISON:  01/28/2019 FINDINGS: The heart size and mediastinal contours are within normal limits. Mild pulmonary hyperinflation, upper lobe predominant. Pulmonary consolidation or edema. The visualized skeletal structures are unremarkable.  IMPRESSION: Mild upper lobe predominant hyperinflation of lungs. No active pulmonary disease. Electronically Signed   By: Tollie Eth M.D.   On: 02/21/2019 22:12    Microbiology: Recent Results (from the past 240 hour(s))  MRSA PCR Screening     Status: None   Collection Time: 02/22/19  1:36 AM  Result Value Ref Range Status   MRSA by PCR NEGATIVE NEGATIVE Final    Comment:        The GeneXpert MRSA Assay (FDA approved for NASAL specimens only), is one component of [facility] Swift comprehensive MRSA colonization surveillance program. It is not intended to diagnose MRSA infection nor to guide or monitor treatment for MRSA infections. Performed at [facility], [address], [place], [place] [address]      Labs: Basic Metabolic Panel: Recent Labs  Lab 02/22/19 0955 02/22/19 1351 02/23/19 0441 02/24/19 0429 02/25/19 0345  NA 140 140 136 140 141  K 3.8 4.1 4.0 3.7 3.5  CL 116* 114* 111 112* 109  CO2 17* 18* 21* 21* 24  GLUCOSE 102* 130* 234* 151* 113*  BUN CREATININE 1.09 1.06 0.99 0.88 0.75  CALCIUM 8.1* 8.0* 7.8* 8.1* 8.5*  MG  --   --  1.9 1.7 1.9   Liver Function Tests: Recent Labs  Lab 02/21/19 2215 02/23/19 0441 02/24/19 0429 02/25/19 0345  AST ALT ALKPHOS 135* 108 106 114  BILITOT 1.4* 0.4 0.4 0.5  PROT 6.9 5.4* 5.6* 6.1*  ALBUMIN 4.0 3.0* 3.1* 3.4*   Recent Labs  Lab 02/23/19 0441  LIPASE 20   No results for input(s): AMMONIA in the last 168 hours. CBC: Recent Labs  Lab 02/21/19 2215 02/22/19 0214 02/23/19 0441 02/24/19 0429 02/25/19 0345  WBC 7.4 6.9 6.1 5.5 3.9*  NEUTROABS 5.4  --   --   --   --   HGB 12.7* 13.1 11.5* 11.9* 12.7*  HCT 41.1 42.0 37.6* 36.7* 39.9  MCV 90.9 91.1 90.8 87.2 88.1  PLT 321 282 255 240 259   Cardiac Enzymes: Recent Labs  Lab 02/23/19 0903 02/23/19 1441 02/23/19 1947 02/24/19 0429  TROPONINI 0.06* 0.07* 0.05* 0.06*   BNP: BNP (last 3 results) Recent  Labs    11/12/18 0529 11/19/18 0819  BNP 15.0 10.2    ProBNP (last 3 results) No results for input(s): PROBNP  in the last 8760 hours.  CBG: Recent Labs  Lab 02/24/19 1727 02/24/19 2202 02/25/19 0415 02/25/19 0738 02/25/19 1126  GLUCAP 250* 183* 97 105* 226*       Signed:  Lacretia Nicks MD.  [facility] 02/25/2019, 12:58 PM

## 2019-02-27 ENCOUNTER — Encounter: Payer: Self-pay | Admitting: *Deleted

## 2019-02-27 ENCOUNTER — Other Ambulatory Visit: Payer: Self-pay | Admitting: *Deleted

## 2019-02-27 ENCOUNTER — Other Ambulatory Visit: Payer: Self-pay

## 2019-02-27 DIAGNOSIS — E1110 Type 2 diabetes mellitus with ketoacidosis without coma: Secondary | ICD-10-CM

## 2019-02-27 NOTE — Patient Outreach (Signed)
[facility]) Care Management  02/27/2019  NEVAAN AHERNE [birth year] [id number]    Referral: 02/27/2019 Outreach date: 02/27/2019 Verified primary care provider to do [facility] (Dr. Norberto Sorenson)  Initial outreach today unsuccessful. RN able to leave a HIPAA approved voice message requesting a call back. Will inquire further on pt's management of care at that time.  Will rescheduled another follow up call this week.   Elliot Cousin, RN Care Management Coordinator [facility] Main Office [phone]

## 2019-03-02 ENCOUNTER — Other Ambulatory Visit: Payer: Self-pay | Admitting: *Deleted

## 2019-03-02 NOTE — Patient Outreach (Signed)
[facility]) Care Management  03/02/2019  TOMOHIRO MAZZUCCO 05-20-1956 [id number]    RN 2nd outreach attempt to reach pt however only able to leave a HIPAA approved voice message requesting a call back. Will further engage at that time on needed services.   Plan: Will reschedule another outreach call within the next 4 days.  Elliot Cousin, RN Care Management Coordinator [facility] Main Office [phone]

## 2019-03-08 ENCOUNTER — Other Ambulatory Visit: Payer: Self-pay | Admitting: *Deleted

## 2019-03-08 NOTE — Patient Outreach (Signed)
[facility]) Care Management  03/08/2019  [name] [name] 03-16-1956 [id number]   Outreach attempt #3 to patient. No answer. RN CM left HIPAA compliant voicemail message along with contact info.  RN CM has already sent unsuccessful outreach letter to patient and will close case if no response from patient by the end of this week (10 days).  Elliot Cousin, RN Care Management Coordinator [facility] Main Office [phone]

## 2019-03-13 ENCOUNTER — Other Ambulatory Visit: Payer: Self-pay | Admitting: *Deleted

## 2019-03-13 NOTE — Patient Outreach (Signed)
[facility]) Care Management  03/13/2019  FREDDY DISTEFANO 11/29/1956 [id number]    Unsuccessful after several outreach attempts with no respond to calls or outreach letter sent to pt. Will close this case and notify the provider and team accordingly.  Elliot Cousin, RN Care Management Coordinator [facility] Main Office [phone]

## 2019-04-19 IMAGING — CT CT ABDOMEN AND PELVIS WITH CONTRAST
2 of 5 series · 16 of 46 positions shown, 18 images · IV contrast (OMNIPAQUE)
Comparison: 09/29/2017

CLINICAL DATA: Abdominal pain.

EXAM:
CT ABDOMEN AND PELVIS WITH CONTRAST
TECHNIQUE: Multidetector CT imaging of the abdomen and pelvis was performed
using the standard protocol following bolus administration of
intravenous contrast.
CONTRAST:  100mL OMNIPAQUE IOHEXOL 300 MG/ML  SOLN

[Series 2: axial st · axial · 0.77mm/px · z∈[+216,+636]mm · 13 of 99 slices shown, 15 images]
[im 8/99  soft-tissue]
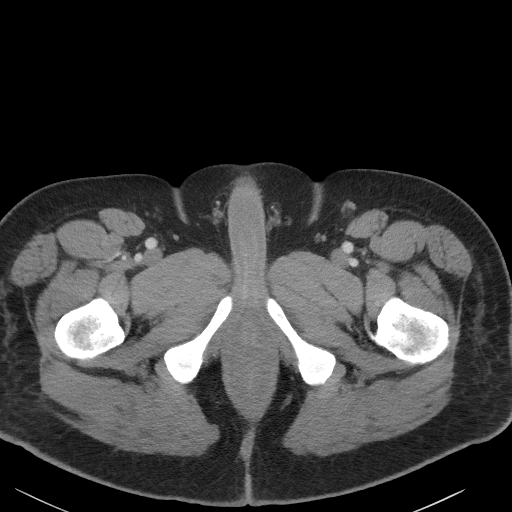
[im 8/99  bone]
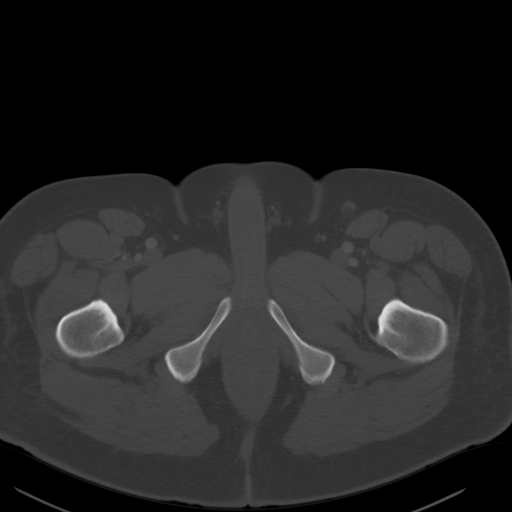
[im 15/99  soft-tissue]
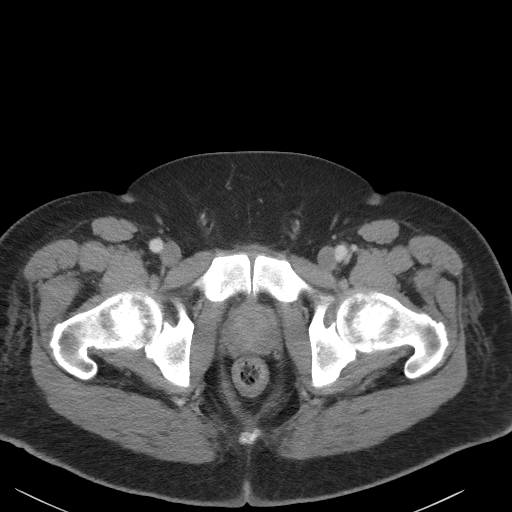
[im 22/99  soft-tissue]
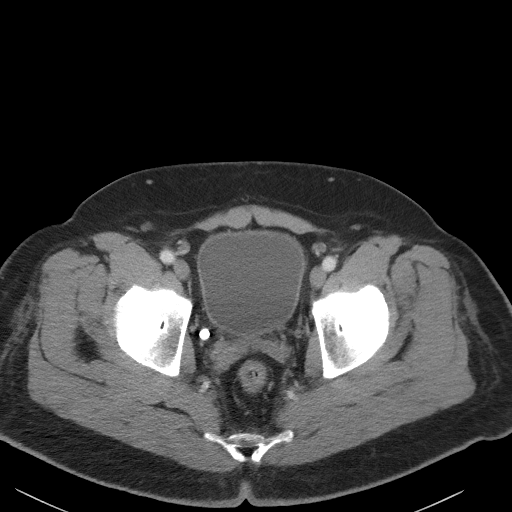
[im 29/99  soft-tissue]
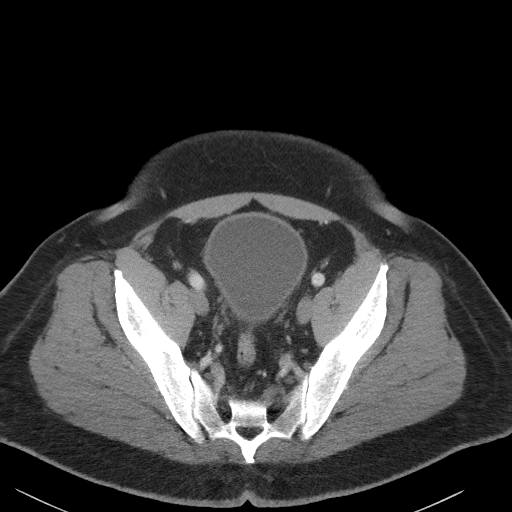
[im 36/99  soft-tissue]
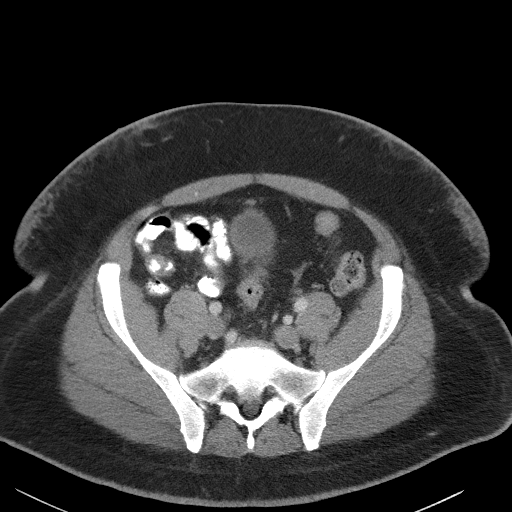
[im 43/99  soft-tissue]
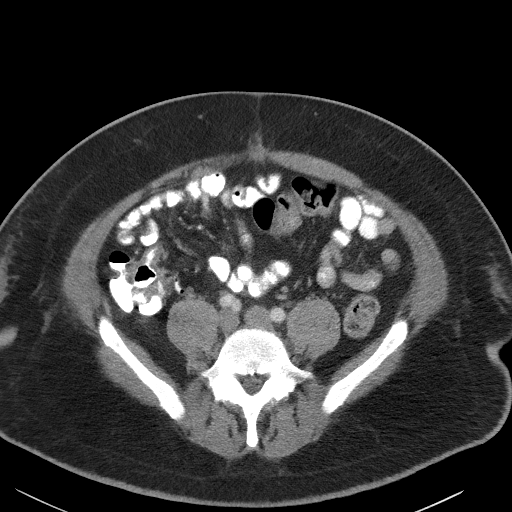
[im 50/99  soft-tissue]
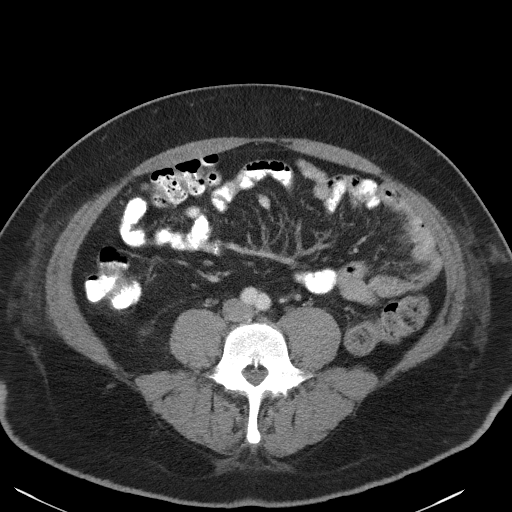
[im 57/99  soft-tissue]
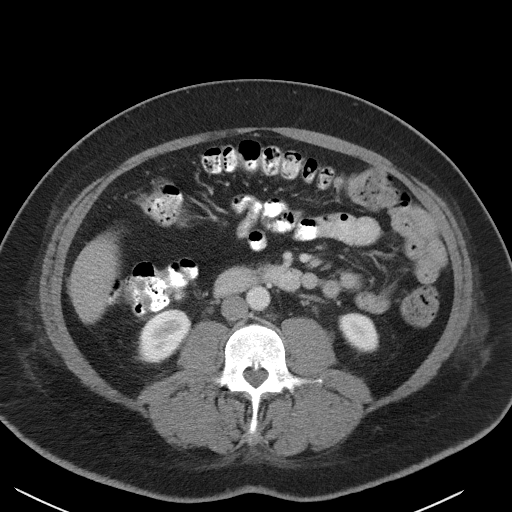
[im 64/99  soft-tissue]
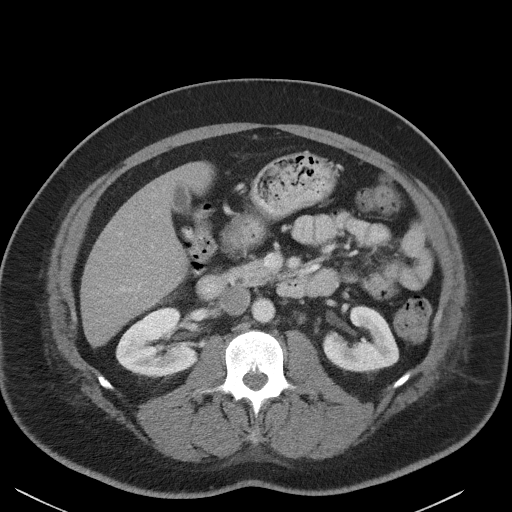
[im 64/99  bone]
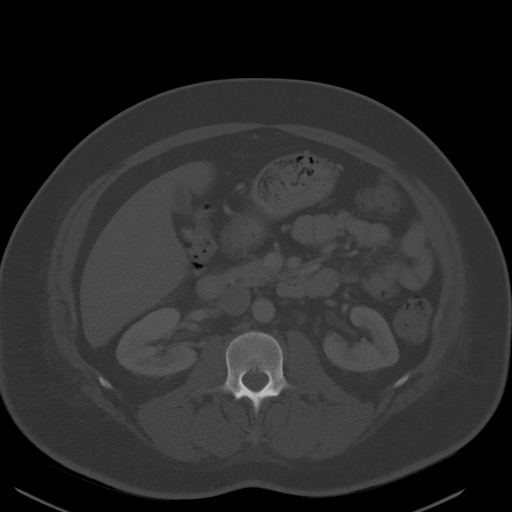
[im 71/99  soft-tissue]
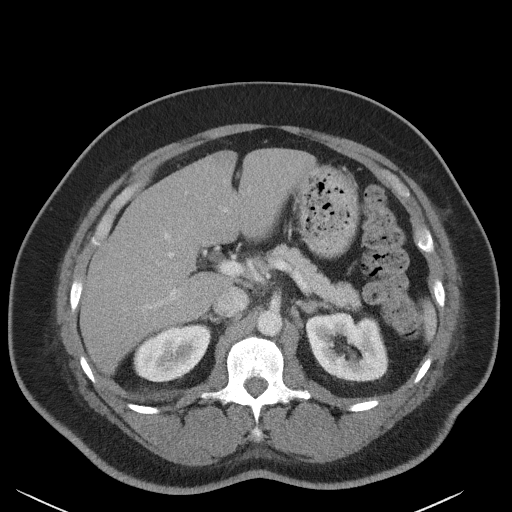
[im 78/99  soft-tissue]
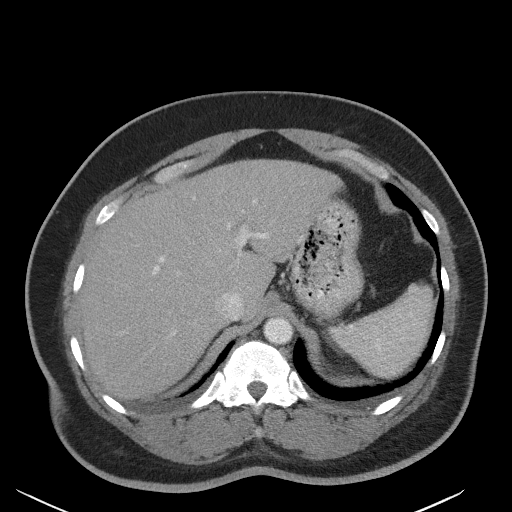
[im 85/99  soft-tissue]
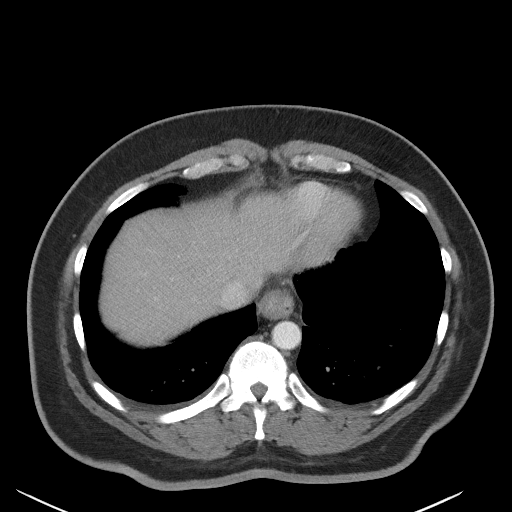
[im 92/99  soft-tissue]
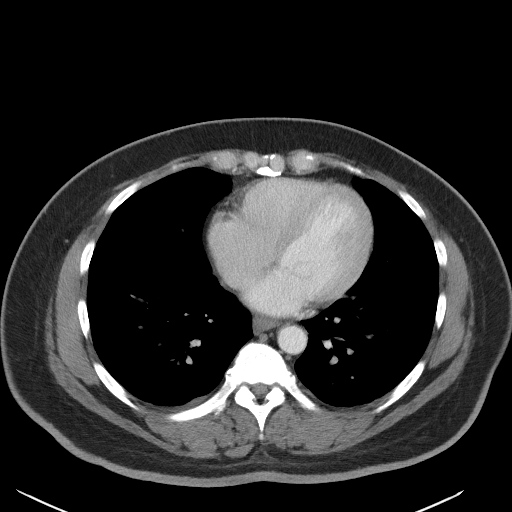

[Series 4: coronal st · coronal · 0.87mm/px · 3 of 90 slices shown]
[im 30/90  soft-tissue]
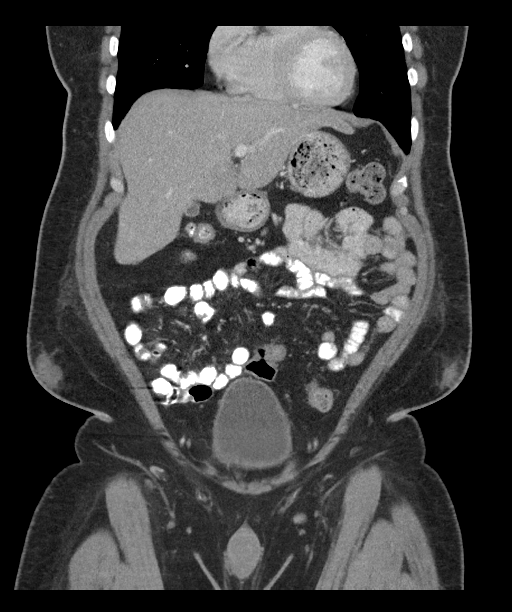
[im 40/90  soft-tissue]
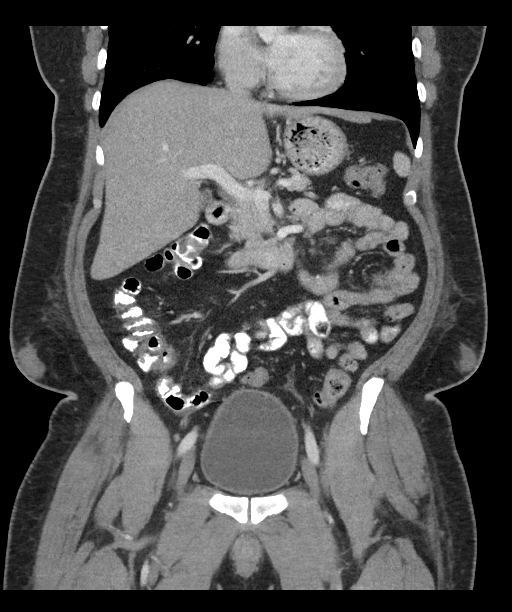
[im 50/90  soft-tissue]
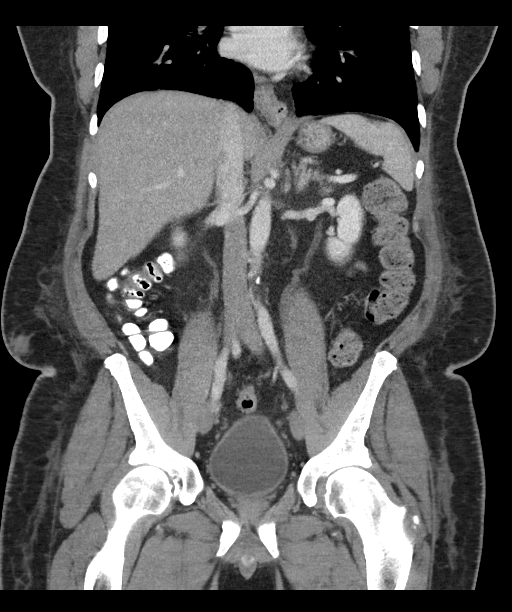

[16 of 46 positions shown; findings below may reference images not displayed]

FINDINGS: Lower chest: 4 mm left lower lobe pulmonary nodule identified on
image 20/series 6. Minimal dependent atelectasis in the lung bases.

Hepatobiliary: No suspicious focal abnormality within the liver
parenchyma. Gallbladder decompressed. No intrahepatic or
extrahepatic biliary dilation.

Pancreas: No focal mass lesion. No dilatation of the main duct. No
intraparenchymal cyst. No peripancreatic edema.

Spleen: No splenomegaly. No focal mass lesion.

Adrenals/Urinary Tract: No adrenal nodule or mass. Kidneys
unremarkable. No evidence for hydroureter. The urinary bladder
appears normal for the degree of distention.

Stomach/Bowel: Tiny hiatal hernia. Stomach otherwise unremarkable.
Duodenum is normally positioned as is the ligament of Treitz. No
small bowel wall thickening. No small bowel dilatation. The terminal
ileum is normal. The appendix is normal. No gross colonic mass. No
colonic wall thickening.

Vascular/Lymphatic: There is abdominal aortic atherosclerosis
without aneurysm. There is no gastrohepatic or hepatoduodenal
ligament lymphadenopathy. No intraperitoneal or retroperitoneal
lymphadenopathy. No pelvic sidewall lymphadenopathy.

Reproductive: The prostate gland and seminal vesicles are
unremarkable.

Other: No intraperitoneal free fluid.

Musculoskeletal: No worrisome lytic or sclerotic osseous
abnormality.
IMPRESSION: 1. No acute findings in the abdomen or pelvis. Specifically, no
findings to explain the patient's history of abdominal pain.
2. 4 mm left lower lobe pulmonary nodule. No follow-up needed if
patient is low-risk. Non-contrast chest CT can be considered in 12
months if patient is high-risk. This recommendation follows the
consensus statement: Guidelines for Management of Incidental
Pulmonary Nodules Detected on CT Images: From the [HOSPITAL]
3.  Aortic Atherosclerois ([id number]-170.0)

## 2019-04-20 ENCOUNTER — Other Ambulatory Visit: Payer: Self-pay

## 2019-04-20 ENCOUNTER — Encounter: Payer: Self-pay | Admitting: Registered Nurse

## 2019-04-20 ENCOUNTER — Encounter: Payer: Medicare HMO | Attending: Physical Medicine & Rehabilitation | Admitting: Registered Nurse

## 2019-04-20 VITALS — Ht 69.0 in | Wt 249.0 lb

## 2019-04-20 DIAGNOSIS — M5416 Radiculopathy, lumbar region: Secondary | ICD-10-CM

## 2019-04-20 DIAGNOSIS — Z5181 Encounter for therapeutic drug level monitoring: Secondary | ICD-10-CM | POA: Insufficient documentation

## 2019-04-20 DIAGNOSIS — M7062 Trochanteric bursitis, left hip: Secondary | ICD-10-CM | POA: Diagnosis not present

## 2019-04-20 DIAGNOSIS — G63 Polyneuropathy in diseases classified elsewhere: Secondary | ICD-10-CM | POA: Diagnosis not present

## 2019-04-20 DIAGNOSIS — G894 Chronic pain syndrome: Secondary | ICD-10-CM

## 2019-04-20 DIAGNOSIS — M7989 Other specified soft tissue disorders: Secondary | ICD-10-CM | POA: Insufficient documentation

## 2019-04-20 DIAGNOSIS — M25551 Pain in right hip: Secondary | ICD-10-CM | POA: Insufficient documentation

## 2019-04-20 DIAGNOSIS — M7061 Trochanteric bursitis, right hip: Secondary | ICD-10-CM | POA: Diagnosis not present

## 2019-04-20 DIAGNOSIS — Z79891 Long term (current) use of opiate analgesic: Secondary | ICD-10-CM | POA: Diagnosis not present

## 2019-04-20 DIAGNOSIS — M25552 Pain in left hip: Secondary | ICD-10-CM | POA: Insufficient documentation

## 2019-04-20 DIAGNOSIS — M799 Soft tissue disorder, unspecified: Secondary | ICD-10-CM | POA: Insufficient documentation

## 2019-04-20 DIAGNOSIS — E119 Type 2 diabetes mellitus without complications: Secondary | ICD-10-CM | POA: Insufficient documentation

## 2019-04-20 DIAGNOSIS — Z79899 Other long term (current) drug therapy: Secondary | ICD-10-CM | POA: Insufficient documentation

## 2019-04-20 MED ORDER — GABAPENTIN 100 MG PO CAPS: ORAL_CAPSULE | ORAL | 3 refills | Status: DC

## 2019-04-20 MED ORDER — HYDROCODONE-ACETAMINOPHEN 7.5-325 MG PO TABS: 1.0000 | ORAL_TABLET | Freq: Four times a day (QID) | ORAL | 0 refills | Status: DC | PRN

## 2019-04-20 NOTE — Progress Notes (Signed)
Subjective:    Patient ID: [name] [name], male    DOB: 18-Jan-1956, 63 y.o.   MRN: [id number]  HPI: [name] [name] is a 63 y.o. male his appointment was changed, due to national recommendations of social distancing due to COVID 19, an audio/video telehealth visit is felt to be most appropriate for this patient at this time.  See Chart message from today for the patient's [facility].     He  states his  pain is located in his lower back radiating into bilateral lower extremities L>R,bilateral hip pain and bilateral feet pain with tingling and burning. He rates his pain 4. His current exercise regime is walking and performing stretching exercises.  [name] [name] equivalent is 30.00MME.  Last UDS was Performed on 05/11/2018, it was consistent.    Angela NevinKenneth Wessling CMA asked the Health and History questions. This provider and Purvis SheffieldKen Wessling verified we were speaking with the correct person using two identifiers.  Pain Inventory Average Pain 3 Pain Right Now 4 My pain is intermittent and sharp  In the last 24 hours, has pain interfered with the following? General activity 4 Relation with others 4 Enjoyment of life 3 What TIME of day is your pain at its worst? morning Sleep (in general) Poor  Pain is worse with: bending and sitting Pain improves with: therapy/exercise, medication and injections Relief from Meds: 8  Mobility walk without assistance how many minutes can you walk? 10 ability to climb steps?  yes do you drive?  yes  Function not employed: date last employed .  Neuro/Psych weakness numbness tingling spasms anxiety  Prior Studies Any changes since last visit?  no  Physicians involved in your care Any changes since last visit?  no   Family History  Problem Relation Age of Onset  . Hypertension Mother   . Alzheimer's disease Mother   . Alcohol abuse Father   . Cancer Father [phone]       "Throat"   . Diabetes type II Brother   . Cancer Brother 68       throat cancer  . Heart disease Neg Hx    Social History   Socioeconomic History  . Marital status: Married    Spouse name: Not on file  . Number of children: Not on file  . Years of education: Not on file  . Highest education level: Not on file  Occupational History  . Occupation: unemployed    Comment: applying for disability  Social Needs  . Financial resource strain: Not on file  . Food insecurity:    Worry: Not on file    Inability: Not on file  . Transportation needs:    Medical: Not on file    Non-medical: Not on file  Tobacco Use  . Smoking status: Former Smoker    Packs/day: 0.25    Years: 3.00    Pack years: 0.75    Last attempt to quit: 11/30/1998    Years since quitting: 20.4  . Smokeless tobacco: Never Used  Substance and Sexual Activity  . Alcohol use: No    Alcohol/week: 1.0 standard drinks    Types: 1 Standard drinks or equivalent per week  . Drug use: No  . Sexual activity: Yes    Birth control/protection: None  Lifestyle  . Physical activity:    Days per week: Not on file    Minutes per session: Not on file  . Stress: Not on  file  Relationships  . Social connections:    Talks on phone: Not on file    Gets together: Not on file    Attends religious service: Not on file    Active member of club or organization: Not on file    Attends meetings of clubs or organizations: Not on file    Relationship status: Not on file  Other Topics Concern  . Not on file  Social History Narrative  . Not on file   Past Surgical History:  Procedure Laterality Date  . LEFT HEART CATHETERIZATION WITH CORONARY ANGIOGRAM N/A 06/14/2014   Procedure: LEFT HEART CATHETERIZATION WITH CORONARY ANGIOGRAM;  Surgeon: Corky Crafts, MD;  Location[facility] CATH LAB;  Service: Cardiovascular;  Laterality: N/A;  . TONSILLECTOMY     Past Medical History:  Diagnosis Date  . Barrett's esophagus   . CAD (coronary artery  disease)    a. NSTEMI 05/2014 - occluded RCA dominant proximal s/p asp-thrombectomy/DES to RCA, minimal LAD/LCx, EF 50% by cath, 65-70% by echo.  . Depression   . Diabetes type 2, uncontrolled (HCC)   . Former tobacco use   . GERD (gastroesophageal reflux disease)   . Hemorrhoids    hx of  . Hypercholesterolemia   . Hypertension   . MI (myocardial infarction) (HCC)   . Peripheral neuropathy    Ht 5\' 9"  (1.753 m)   Wt 249 lb (112.9 kg)   BMI 36.77 kg/m   Opioid Risk Score:   Fall Risk Score:  `1  Depression screen PHQ 2/9  Depression screen [facility] 2/9 09/12/2018 06/10/2018 04/12/2018 03/11/2018 10/01/2017 09/29/2017 03/08/2017  Decreased Interest 0 0 0 0 0 0 0  Down, Depressed, Hopeless 0 0 0 0 0 0 0  PHQ - 2 Score 0 0 0 0 0 0 0  Altered sleeping - - - - - - -  Tired, decreased energy - - - - - - -  Change in appetite - - - - - - -  Feeling bad or failure about yourself  - - - - - - -  Trouble concentrating - - - - - - -  Moving slowly or fidgety/restless - - - - - - -  Suicidal thoughts - - - - - - -  PHQ-9 Score - - - - - - -  Difficult doing work/chores - - - - - - -  Some recent data might be hidden    Review of Systems  Constitutional: Negative.   HENT: Negative.   Eyes: Negative.   Respiratory: Negative.   Gastrointestinal: Negative.   Endocrine: Negative.   Genitourinary: Negative.   Musculoskeletal: Positive for arthralgias and back pain.  Skin: Negative.   Allergic/Immunologic: Negative.   Neurological: Positive for weakness and numbness.       Tingling   Psychiatric/Behavioral: The patient is nervous/anxious.   All other systems reviewed and are negative.      Objective:   Physical Exam Vitals signs and nursing note reviewed.  Musculoskeletal:     Comments: No Physical Exam Performed: Virtual Visit  Neurological:     Mental Status: He is oriented to person, place, and time.           Assessment & Plan:  1.Bilateral hip pain. MRI evidence of  chondromalacia acetabulae right greater than left. Ortho Following: Dr. Thurston Hole regarding right hip placement.04/20/2019 We will continue with current medication regime.Refilled: Hydrocodone 7.5/325 mg One tablet every 6 hours as needed #120. Second script e-scribe for  the following month. We will continue the opioid monitoring program, this consists of regular clinic visits, examinations, urine drug screen, pill counts as well as use of [place] Controlled Substance Reporting System.  2. Cervicalgia: Ortho Following: No complaints today. Continue with HEP as tolerated. Continue to Monitor.04/20/2019 3. Type II DM: Polyneuropathy:Continuecurrent medication regime withGabapentin:04/20/2019 4. Lumbar Radiculitis: Continuecurrent medication regimen withGabapentin. 04/20/2019. 5. Bilateral Ankle Joint Pain: No complaints today. Continue current medication regime.   F/U in 2 months  [employer] Location of patient: In his Home Location of provider: Office Established patient Time spent on call:10 minutes

## 2019-05-03 ENCOUNTER — Inpatient Hospital Stay (HOSPITAL_COMMUNITY)
Admission: EM | Admit: 2019-05-03 | Discharge: 2019-05-06 | DRG: 638 | Disposition: A | Discharge status: Home / Self Care | Payer: Medicare HMO | Attending: Internal Medicine | Admitting: Internal Medicine

## 2019-05-03 ENCOUNTER — Encounter (HOSPITAL_COMMUNITY): Payer: Self-pay

## 2019-05-03 ENCOUNTER — Emergency Department (HOSPITAL_COMMUNITY): Payer: Medicare HMO

## 2019-05-03 ENCOUNTER — Other Ambulatory Visit: Payer: Self-pay

## 2019-05-03 DIAGNOSIS — Z87891 Personal history of nicotine dependence: Secondary | ICD-10-CM

## 2019-05-03 DIAGNOSIS — Z833 Family history of diabetes mellitus: Secondary | ICD-10-CM | POA: Diagnosis not present

## 2019-05-03 DIAGNOSIS — R1084 Generalized abdominal pain: Secondary | ICD-10-CM | POA: Diagnosis not present

## 2019-05-03 DIAGNOSIS — I252 Old myocardial infarction: Secondary | ICD-10-CM | POA: Diagnosis not present

## 2019-05-03 DIAGNOSIS — K219 Gastro-esophageal reflux disease without esophagitis: Secondary | ICD-10-CM | POA: Diagnosis not present

## 2019-05-03 DIAGNOSIS — Z8249 Family history of ischemic heart disease and other diseases of the circulatory system: Secondary | ICD-10-CM

## 2019-05-03 DIAGNOSIS — R358 Other polyuria: Secondary | ICD-10-CM | POA: Diagnosis present

## 2019-05-03 DIAGNOSIS — E7800 Pure hypercholesterolemia, unspecified: Secondary | ICD-10-CM | POA: Diagnosis present

## 2019-05-03 DIAGNOSIS — Z820 Family history of epilepsy and other diseases of the nervous system: Secondary | ICD-10-CM | POA: Diagnosis not present

## 2019-05-03 DIAGNOSIS — Z808 Family history of malignant neoplasm of other organs or systems: Secondary | ICD-10-CM

## 2019-05-03 DIAGNOSIS — I2510 Atherosclerotic heart disease of native coronary artery without angina pectoris: Secondary | ICD-10-CM | POA: Diagnosis present

## 2019-05-03 DIAGNOSIS — E1140 Type 2 diabetes mellitus with diabetic neuropathy, unspecified: Secondary | ICD-10-CM | POA: Diagnosis not present

## 2019-05-03 DIAGNOSIS — Z811 Family history of alcohol abuse and dependence: Secondary | ICD-10-CM

## 2019-05-03 DIAGNOSIS — F329 Major depressive disorder, single episode, unspecified: Secondary | ICD-10-CM | POA: Diagnosis present

## 2019-05-03 DIAGNOSIS — Z7982 Long term (current) use of aspirin: Secondary | ICD-10-CM

## 2019-05-03 DIAGNOSIS — N179 Acute kidney failure, unspecified: Secondary | ICD-10-CM | POA: Diagnosis not present

## 2019-05-03 DIAGNOSIS — E875 Hyperkalemia: Secondary | ICD-10-CM | POA: Diagnosis not present

## 2019-05-03 DIAGNOSIS — K2270 Barrett's esophagus without dysplasia: Secondary | ICD-10-CM | POA: Diagnosis present

## 2019-05-03 DIAGNOSIS — Z6834 Body mass index (BMI) 34.0-34.9, adult: Secondary | ICD-10-CM

## 2019-05-03 DIAGNOSIS — E1110 Type 2 diabetes mellitus with ketoacidosis without coma: Principal | ICD-10-CM | POA: Diagnosis present

## 2019-05-03 DIAGNOSIS — E1165 Type 2 diabetes mellitus with hyperglycemia: Secondary | ICD-10-CM

## 2019-05-03 DIAGNOSIS — Z794 Long term (current) use of insulin: Secondary | ICD-10-CM

## 2019-05-03 DIAGNOSIS — F32A Depression, unspecified: Secondary | ICD-10-CM | POA: Diagnosis present

## 2019-05-03 DIAGNOSIS — E785 Hyperlipidemia, unspecified: Secondary | ICD-10-CM | POA: Diagnosis not present

## 2019-05-03 DIAGNOSIS — R52 Pain, unspecified: Secondary | ICD-10-CM | POA: Diagnosis not present

## 2019-05-03 DIAGNOSIS — R079 Chest pain, unspecified: Secondary | ICD-10-CM | POA: Diagnosis not present

## 2019-05-03 DIAGNOSIS — I10 Essential (primary) hypertension: Secondary | ICD-10-CM | POA: Diagnosis present

## 2019-05-03 DIAGNOSIS — E669 Obesity, unspecified: Secondary | ICD-10-CM | POA: Diagnosis not present

## 2019-05-03 DIAGNOSIS — Z955 Presence of coronary angioplasty implant and graft: Secondary | ICD-10-CM | POA: Diagnosis not present

## 2019-05-03 DIAGNOSIS — R69 Illness, unspecified: Secondary | ICD-10-CM | POA: Diagnosis not present

## 2019-05-03 DIAGNOSIS — IMO0002 Reserved for concepts with insufficient information to code with codable children: Secondary | ICD-10-CM | POA: Diagnosis present

## 2019-05-03 DIAGNOSIS — Z20828 Contact with and (suspected) exposure to other viral communicable diseases: Secondary | ICD-10-CM | POA: Diagnosis present

## 2019-05-03 DIAGNOSIS — R631 Polydipsia: Secondary | ICD-10-CM | POA: Diagnosis present

## 2019-05-03 DIAGNOSIS — Z79899 Other long term (current) drug therapy: Secondary | ICD-10-CM

## 2019-05-03 DIAGNOSIS — R112 Nausea with vomiting, unspecified: Secondary | ICD-10-CM | POA: Diagnosis not present

## 2019-05-03 DIAGNOSIS — E1310 Other specified diabetes mellitus with ketoacidosis without coma: Secondary | ICD-10-CM

## 2019-05-03 LAB — CBC WITH DIFFERENTIAL/PLATELET
Abs Immature Granulocytes: 0.09 10*3/uL — ABNORMAL HIGH (ref 0.00–0.07)
Basophils Absolute: 0.1 10*3/uL (ref 0.0–0.1)
Basophils Relative: 0 %
Eosinophils Absolute: 0.1 10*3/uL (ref 0.0–0.5)
Eosinophils Relative: 0 %
HCT: 37.3 % — ABNORMAL LOW (ref 39.0–52.0)
Hemoglobin: 11.4 g/dL — ABNORMAL LOW (ref 13.0–17.0)
Immature Granulocytes: 1 %
Lymphocytes Relative: 12 %
Lymphs Abs: 2.4 10*3/uL (ref 0.7–4.0)
MCH: 28.9 pg (ref 26.0–34.0)
MCHC: 30.6 g/dL (ref 30.0–36.0)
MCV: 94.7 fL (ref 80.0–100.0)
Monocytes Absolute: 0.6 10*3/uL (ref 0.1–1.0)
Monocytes Relative: 3 %
Neutro Abs: 15.9 10*3/uL — ABNORMAL HIGH (ref 1.7–7.7)
Neutrophils Relative %: 84 %
Platelets: 296 10*3/uL (ref 150–400)
RBC: 3.94 MIL/uL — ABNORMAL LOW (ref 4.22–5.81)
RDW: 14.6 % (ref 11.5–15.5)
WBC: 19 10*3/uL — ABNORMAL HIGH (ref 4.0–10.5)
nRBC: 0 % (ref 0.0–0.2)

## 2019-05-03 LAB — COMPREHENSIVE METABOLIC PANEL
ALT: 15 U/L (ref 0–44)
AST: 20 U/L (ref 15–41)
Albumin: 3.4 g/dL — ABNORMAL LOW (ref 3.5–5.0)
Alkaline Phosphatase: 113 U/L (ref 38–126)
Anion gap: 18 — ABNORMAL HIGH (ref 5–15)
BUN: 19 mg/dL (ref 6–20)
CO2: 8 mmol/L — ABNORMAL LOW (ref 22–32)
Calcium: 7.4 mg/dL — ABNORMAL LOW (ref 8.9–10.3)
Chloride: 106 mmol/L (ref 98–111)
Creatinine, Ser: 1.46 mg/dL — ABNORMAL HIGH (ref 0.61–1.24)
GFR calc Af Amer: 60 mL/min — ABNORMAL LOW (ref >60)
GFR calc non Af Amer: 52 mL/min — ABNORMAL LOW (ref >60)
Glucose, Bld: 668 mg/dL (ref 70–99)
Potassium: 6.6 mmol/L (ref 3.5–5.1)
Sodium: 132 mmol/L — ABNORMAL LOW (ref 135–145)
Total Bilirubin: 1.5 mg/dL — ABNORMAL HIGH (ref 0.3–1.2)
Total Protein: 5.8 g/dL — ABNORMAL LOW (ref 6.5–8.1)

## 2019-05-03 LAB — TROPONIN I: Troponin I: <0.03 ng/mL (ref <0.03)

## 2019-05-03 LAB — BASIC METABOLIC PANEL
Anion gap: 21 — ABNORMAL HIGH (ref 5–15)
Anion gap: 14 (ref 5–15)
BUN: 22 mg/dL — ABNORMAL HIGH (ref 6–20)
BUN: 21 mg/dL — ABNORMAL HIGH (ref 6–20)
CO2: 10 mmol/L — ABNORMAL LOW (ref 22–32)
CO2: 13 mmol/L — ABNORMAL LOW (ref 22–32)
Calcium: 8.9 mg/dL (ref 8.9–10.3)
Calcium: 8.8 mg/dL — ABNORMAL LOW (ref 8.9–10.3)
Chloride: 104 mmol/L (ref 98–111)
Chloride: 108 mmol/L (ref 98–111)
Creatinine, Ser: 1.73 mg/dL — ABNORMAL HIGH (ref 0.61–1.24)
Creatinine, Ser: 1.5 mg/dL — ABNORMAL HIGH (ref 0.61–1.24)
GFR calc Af Amer: 49 mL/min — ABNORMAL LOW (ref >60)
GFR calc Af Amer: 58 mL/min — ABNORMAL LOW (ref >60)
GFR calc non Af Amer: 42 mL/min — ABNORMAL LOW (ref >60)
GFR calc non Af Amer: 50 mL/min — ABNORMAL LOW (ref >60)
Glucose, Bld: 354 mg/dL — ABNORMAL HIGH (ref 70–99)
Glucose, Bld: 190 mg/dL — ABNORMAL HIGH (ref 70–99)
Potassium: 5.3 mmol/L — ABNORMAL HIGH (ref 3.5–5.1)
Potassium: 4.9 mmol/L (ref 3.5–5.1)
Sodium: 135 mmol/L (ref 135–145)
Sodium: 135 mmol/L (ref 135–145)

## 2019-05-03 LAB — URINALYSIS, ROUTINE W REFLEX MICROSCOPIC
Bacteria, UA: NONE SEEN
Bilirubin Urine: NEGATIVE
Glucose, UA: >=500 mg/dL — AB
Hgb urine dipstick: NEGATIVE
Ketones, ur: 80 mg/dL — AB
Leukocytes,Ua: NEGATIVE
Nitrite: NEGATIVE
Protein, ur: NEGATIVE mg/dL
Specific Gravity, Urine: 1.018 (ref 1.005–1.030)
pH: 5 (ref 5.0–8.0)

## 2019-05-03 LAB — GLUCOSE, CAPILLARY
Glucose-Capillary: 246 mg/dL — ABNORMAL HIGH (ref 70–99)
Glucose-Capillary: 209 mg/dL — ABNORMAL HIGH (ref 70–99)
Glucose-Capillary: 164 mg/dL — ABNORMAL HIGH (ref 70–99)
Glucose-Capillary: 148 mg/dL — ABNORMAL HIGH (ref 70–99)
Glucose-Capillary: 125 mg/dL — ABNORMAL HIGH (ref 70–99)

## 2019-05-03 LAB — SARS CORONAVIRUS 2 BY RT PCR (HOSPITAL ORDER, PERFORMED IN ~~LOC~~ HOSPITAL LAB): SARS Coronavirus 2: NEGATIVE

## 2019-05-03 LAB — CBG MONITORING, ED
Glucose-Capillary: >600 mg/dL (ref 70–99)
Glucose-Capillary: >600 mg/dL (ref 70–99)
Glucose-Capillary: 526 mg/dL (ref 70–99)

## 2019-05-03 LAB — BLOOD GAS, VENOUS
Acid-base deficit: 20.4 mmol/L — ABNORMAL HIGH (ref 0.0–2.0)
Bicarbonate: 7.9 mmol/L — ABNORMAL LOW (ref 20.0–28.0)
O2 Saturation: 64.5 %
Patient temperature: 98.6
pCO2, Ven: 25.8 mmHg — ABNORMAL LOW (ref 44.0–60.0)
pH, Ven: 7.114 — CL (ref 7.250–7.430)
pO2, Ven: 43.2 mmHg (ref 32.0–45.0)

## 2019-05-03 LAB — MRSA PCR SCREENING: MRSA by PCR: NEGATIVE

## 2019-05-03 MED ORDER — INSULIN ASPART 100 UNIT/ML ~~LOC~~ SOLN
10.0000 [IU] | Freq: Once | SUBCUTANEOUS | Status: AC
  Administered 2019-05-03: 10 [IU] via INTRAVENOUS
  Filled 2019-05-03: qty 1

## 2019-05-03 MED ORDER — HYDROCODONE-ACETAMINOPHEN 7.5-325 MG PO TABS
1.0000 | ORAL_TABLET | Freq: Four times a day (QID) | ORAL | Status: DC | PRN
  Administered 2019-05-04 – 2019-05-06 (×4): 1 via ORAL
  Filled 2019-05-03 (×4): qty 1

## 2019-05-03 MED ORDER — SODIUM CHLORIDE 0.9 % IV SOLN: INTRAVENOUS | Status: DC

## 2019-05-03 MED ORDER — ACETAMINOPHEN 650 MG RE SUPP: 650.0000 mg | Freq: Four times a day (QID) | RECTAL | Status: DC | PRN

## 2019-05-03 MED ORDER — METOPROLOL TARTRATE 25 MG PO TABS
12.5000 mg | ORAL_TABLET | Freq: Two times a day (BID) | ORAL | Status: DC
  Administered 2019-05-03 – 2019-05-06 (×6): 12.5 mg via ORAL
  Filled 2019-05-03 (×6): qty 1

## 2019-05-03 MED ORDER — SODIUM BICARBONATE 8.4 % IV SOLN
Freq: Once | INTRAVENOUS | Status: AC
  Administered 2019-05-03: 16:00:00 via INTRAVENOUS
  Filled 2019-05-03: qty 100

## 2019-05-03 MED ORDER — ENOXAPARIN SODIUM 40 MG/0.4ML ~~LOC~~ SOLN
40.0000 mg | SUBCUTANEOUS | Status: DC
  Administered 2019-05-03 – 2019-05-05 (×3): 40 mg via SUBCUTANEOUS
  Filled 2019-05-03 (×3): qty 0.4

## 2019-05-03 MED ORDER — SODIUM CHLORIDE 0.9 % IV BOLUS
1000.0000 mL | Freq: Once | INTRAVENOUS | Status: AC
  Administered 2019-05-03: 1000 mL via INTRAVENOUS

## 2019-05-03 MED ORDER — PANTOPRAZOLE SODIUM 40 MG PO TBEC
40.0000 mg | DELAYED_RELEASE_TABLET | Freq: Every day | ORAL | Status: DC
  Administered 2019-05-03 – 2019-05-06 (×4): 40 mg via ORAL
  Filled 2019-05-03 (×4): qty 1

## 2019-05-03 MED ORDER — NITROGLYCERIN 0.4 MG SL SUBL: 0.4000 mg | SUBLINGUAL_TABLET | SUBLINGUAL | Status: DC | PRN

## 2019-05-03 MED ORDER — CHLORHEXIDINE GLUCONATE CLOTH 2 % EX PADS
6.0000 | MEDICATED_PAD | Freq: Every day | CUTANEOUS | Status: DC
  Administered 2019-05-04: 6 via TOPICAL

## 2019-05-03 MED ORDER — GABAPENTIN 100 MG PO CAPS
200.0000 mg | ORAL_CAPSULE | Freq: Two times a day (BID) | ORAL | Status: DC
  Administered 2019-05-03 – 2019-05-06 (×6): 200 mg via ORAL
  Filled 2019-05-03 (×6): qty 2

## 2019-05-03 MED ORDER — INSULIN REGULAR(HUMAN) IN NACL 100-0.9 UT/100ML-% IV SOLN
INTRAVENOUS | Status: DC
  Administered 2019-05-03: 5.4 [IU]/h via INTRAVENOUS
  Filled 2019-05-03: qty 100

## 2019-05-03 MED ORDER — ROSUVASTATIN CALCIUM 20 MG PO TABS
40.0000 mg | ORAL_TABLET | Freq: Every day | ORAL | Status: DC
  Administered 2019-05-03 – 2019-05-06 (×4): 40 mg via ORAL
  Filled 2019-05-03 (×4): qty 2

## 2019-05-03 MED ORDER — ONDANSETRON HCL 4 MG PO TABS: 4.0000 mg | ORAL_TABLET | Freq: Four times a day (QID) | ORAL | Status: DC | PRN

## 2019-05-03 MED ORDER — INSULIN REGULAR(HUMAN) IN NACL 100-0.9 UT/100ML-% IV SOLN
INTRAVENOUS | Status: DC
  Administered 2019-05-03: 9.5 [IU]/h via INTRAVENOUS
  Filled 2019-05-03: qty 100

## 2019-05-03 MED ORDER — SODIUM CHLORIDE 0.9 % IV SOLN
1.0000 g | Freq: Once | INTRAVENOUS | Status: AC
  Administered 2019-05-03: 1 g via INTRAVENOUS
  Filled 2019-05-03: qty 10

## 2019-05-03 MED ORDER — ACETAMINOPHEN 325 MG PO TABS
650.0000 mg | ORAL_TABLET | Freq: Four times a day (QID) | ORAL | Status: DC | PRN
  Administered 2019-05-04: 650 mg via ORAL
  Filled 2019-05-03: qty 2

## 2019-05-03 MED ORDER — DEXTROSE-NACL 5-0.45 % IV SOLN
INTRAVENOUS | Status: DC
  Administered 2019-05-03: 21:00:00 via INTRAVENOUS

## 2019-05-03 MED ORDER — ONDANSETRON HCL 4 MG/2ML IJ SOLN
4.0000 mg | Freq: Four times a day (QID) | INTRAMUSCULAR | Status: DC | PRN
  Administered 2019-05-03 – 2019-05-05 (×2): 4 mg via INTRAVENOUS
  Filled 2019-05-03: qty 2

## 2019-05-03 NOTE — ED Notes (Signed)
Bed: WA20 Expected date:  Expected time:  Means of arrival:  Comments: EMS/Abd. Pain/hyperglycemia

## 2019-05-03 NOTE — ED Notes (Signed)
Date and time results received: 05/03/19 14:15   Test: Glucose  Critical Value: 668  Name of Provider Notified: Latanya Presser. PA   Orders Received? Or Actions Taken?: Will continue to monitor patient and await new orders

## 2019-05-03 NOTE — ED Triage Notes (Signed)
Pt BIB EMS from hotel room. Pt c/o abdominal cramping, N/V/D x 2 days. Pt c/o hyperglycemia. Pt reports taking his insulin as prescribed.   CBG 589 IM zofran 4 mg from EMS

## 2019-05-03 NOTE — ED Notes (Signed)
ED TO INPATIENT HANDOFF REPORT  ED Nurse Name and Phone #:   S Name/Age/Gender [name] [name] 63 y.o. male Room/Bed: WA20/WA20  Code Status   Code Status: Prior  Home/SNF/Other Home Patient oriented to: self, place, time and situation Is this baseline? Yes   Triage Complete: Triage complete  Chief Complaint abdomina pain hyperglycemia  Triage Note Pt BIB EMS from hotel room. Pt c/o abdominal cramping, N/V/D x 2 days. Pt c/o hyperglycemia. Pt reports taking his insulin as prescribed.   CBG 589 IM zofran 4 mg from EMS   Allergies No Known Allergies  Level of Care/Admitting Diagnosis ED Disposition    ED Disposition Condition Comment   Admit  Hospital Area: [facility] [[id number]]  Level of Care: Stepdown [14]  Admit to SDU based on following criteria: Other see comments  Comments: dka  Covid Evaluation: N/A  Diagnosis: DKA (diabetic ketoacidoses) [facility]) [295621]  Admitting Physician: Coralie Keens [[id number]]  Attending Physician: Coralie Keens [[id number]]  PT Class (Do Not Modify): Observation [104]  PT Acc Code (Do Not Modify): Observation [10022]       B Medical/Surgery History Past Medical History:  Diagnosis Date  . Barrett's esophagus   . CAD (coronary artery disease)    a. NSTEMI 05/2014 - occluded RCA dominant proximal s/p asp-thrombectomy/DES to RCA, minimal LAD/LCx, EF 50% by cath, 65-70% by echo.  . Depression   . Diabetes type 2, uncontrolled (HCC)   . Former tobacco use   . GERD (gastroesophageal reflux disease)   . Hemorrhoids    hx of  . Hypercholesterolemia   . Hypertension   . MI (myocardial infarction) (HCC)   . Peripheral neuropathy    Past Surgical History:  Procedure Laterality Date  . LEFT HEART CATHETERIZATION WITH CORONARY ANGIOGRAM N/A 06/14/2014   Procedure: LEFT HEART CATHETERIZATION WITH CORONARY ANGIOGRAM;  Surgeon: Corky Crafts, MD;  Location: [facility] CATH LAB;  Service: Cardiovascular;   Laterality: N/A;  . TONSILLECTOMY       A IV Location/Drains/Wounds Patient Lines/Drains/Airways Status   Active Line/Drains/Airways    Name:   Placement date:   Placement time:   Site:   Days:   Peripheral IV 05/03/19 Right Hand   05/03/19    -    Hand   less than 1          Intake/Output Last 24 hours  Intake/Output Summary (Last 24 hours) at 05/03/2019 1656 Last data filed at 05/03/2019 1505 Gross per 24 hour  Intake -  Output 600 ml  Net -600 ml    Labs/Imaging Results for orders placed or performed during the hospital encounter of 05/03/19 (from the past 48 hour(s))  Urinalysis, Routine w reflex microscopic     Status: Abnormal   Collection Time: 05/03/19 12:16 PM  Result Value Ref Range   Color, Urine COLORLESS (A) YELLOW   APPearance CLEAR CLEAR   Specific Gravity, Urine 1.018 1.005 - 1.030   pH 5.0 5.0 - 8.0   Glucose, UA >=500 (A) NEGATIVE mg/dL   Hgb urine dipstick NEGATIVE NEGATIVE   Bilirubin Urine NEGATIVE NEGATIVE   Ketones, ur 80 (A) NEGATIVE mg/dL   Protein, ur NEGATIVE NEGATIVE mg/dL   Nitrite NEGATIVE NEGATIVE   Leukocytes,Ua NEGATIVE NEGATIVE   RBC / HPF 0-5 0 - 5 RBC/hpf   WBC, UA 0-5 0 - 5 WBC/hpf   Bacteria, UA NONE SEEN NONE SEEN    Comment: Performed at [facility], [address] [place], [place] [address]  Blood gas, venous     Status: Abnormal   Collection Time: 05/03/19  1:18 PM  Result Value Ref Range   pH, Ven 7.114 (LL) 7.250 - 7.430    Comment: CRITICAL RESULT CALLED TO, READ BACK BY AND VERIFIED WITH: A.NANAVATI, MD AT 1321 BY M.JESTER, RRT, RCP ON 05/03/2019    pCO2, Ven 25.8 (L) 44.0 - 60.0 mmHg   pO2, Ven 43.2 32.0 - 45.0 mmHg   Bicarbonate 7.9 (L) 20.0 - 28.0 mmol/L   Acid-base deficit 20.4 (H) 0.0 - 2.0 mmol/L   O2 Saturation 64.5 %   Patient temperature 98.6    Collection site VENOUS    Drawn by DRAWN BY RN    Sample type VENOUS     Comment: Performed at [facility], [address] [place], [place] [address]  Comprehensive metabolic panel     Status: Abnormal   Collection Time: 05/03/19  1:20 PM  Result Value Ref Range   Sodium 132 (L) 135 - 145 mmol/L   Potassium 6.6 (HH) 3.5 - 5.1 mmol/L    Comment: NO VISIBLE HEMOLYSIS Pablo Ledger [id number] @ 1415 BY J SCOTTON    Chloride 106 98 - 111 mmol/L   CO2 8 (L) 22 - 32 mmol/L   Glucose, Bld 668 (HH) 70 - 99 mg/dL    Comment: CRITICAL RESULT CALLED TO, READ BACK BY AND VERIFIED WITH: Pablo Ledger [id number] @ 1415 BY J SCOTTON    BUN 19 6 - 20 mg/dL   Creatinine, Ser 5.62 (H) 0.61 - 1.24 mg/dL   Calcium 7.4 (L) 8.9 - 10.3 mg/dL   Total Protein 5.8 (L) 6.5 - 8.1 g/dL   Albumin 3.4 (L) 3.5 - 5.0 g/dL   AST 20 15 - 41 U/L   ALT 15 0 - 44 U/L   Alkaline Phosphatase 113 38 - 126 U/L   Total Bilirubin 1.5 (H) 0.3 - 1.2 mg/dL   GFR calc non Af Amer 52 (L) >60 mL/min   GFR calc Af Amer 60 (L) >60 mL/min   Anion gap 18 (H) 5 - 15    Comment: Performed at [facility], [address] [place], [place] [address]  CBC with Differential     Status: Abnormal   Collection Time: 05/03/19  1:20 PM  Result Value Ref Range   WBC 19.0 (H) 4.0 - 10.5 K/uL   RBC 3.94 (L) 4.22 - 5.81 MIL/uL   Hemoglobin 11.4 (L) 13.0 - 17.0 g/dL   HCT 57.8 (L) 46.9 - 62.9 %   MCV 94.7 80.0 - 100.0 fL   MCH 28.9 26.0 - 34.0 pg   MCHC 30.6 30.0 - 36.0 g/dL   RDW 52.8 41.3 - 24.4 %   Platelets 296 150 - 400 K/uL   nRBC 0.0 0.0 - 0.2 %   Neutrophils Relative % 84 %   Neutro Abs 15.9 (H) 1.7 - 7.7 K/uL   Lymphocytes Relative 12 %   Lymphs Abs 2.4 0.7 - 4.0 K/uL   Monocytes Relative 3 %   Monocytes Absolute 0.6 0.1 - 1.0 K/uL   Eosinophils Relative 0 %   Eosinophils Absolute 0.1 0.0 - 0.5 K/uL   Basophils Relative 0 %   Basophils Absolute 0.1 0.0 - 0.1 K/uL   Immature Granulocytes 1 %   Abs Immature Granulocytes 0.09 (H) 0.00 - 0.07 K/uL    Comment: Performed at [facility], [address]  [place], [place] [address]  Troponin I - ONCE - STAT     Status: None   Collection Time: 05/03/19  1:20 PM  Result Value Ref Range   Troponin I <0.03 <0.03 ng/mL    Comment: Performed at [facility], [address], [place], [place] [address]  POC CBG, ED     Status: Abnormal   Collection Time: 05/03/19  2:05 PM  Result Value Ref Range   Glucose-Capillary >600 (HH) 70 - 99 mg/dL  CBG monitoring, ED     Status: Abnormal   Collection Time: 05/03/19  3:14 PM  Result Value Ref Range   Glucose-Capillary >600 (HH) 70 - 99 mg/dL  CBG monitoring, ED     Status: Abnormal   Collection Time: 05/03/19  4:45 PM  Result Value Ref Range   Glucose-Capillary 526 (HH) 70 - 99 mg/dL   Dg Chest 1 View  Result Date: 05/03/2019 CLINICAL DATA:  Chest pain. EXAM: CHEST  1 VIEW COMPARISON:  Radiograph February 21, 2019. FINDINGS: The heart size and mediastinal contours are within normal limits. Both lungs are clear. No pneumothorax or pleural effusion is noted. The visualized skeletal structures are unremarkable. IMPRESSION: No active disease. Electronically Signed   By: Lupita Raider M.D.   On: 05/03/2019 12:44    Pending Labs Unresulted Labs (From admission, onward)    Start     Ordered   05/03/19 1700  Basic metabolic panel  Now then every 4 hours,   R     05/03/19 1526   05/03/19 1509  SARS Coronavirus 2 (CEPHEID - Performed in [facility] hospital lab), Hosp Order  (Asymptomatic Patients Labs)  Once,   R    Question:  Rule Out  Answer:  Yes   05/03/19 1508   Signed and Held  CBC  (enoxaparin (LOVENOX)    CrCl >/= 30 ml/min)  Once,   R    Comments:  Baseline for enoxaparin therapy IF NOT ALREADY DRAWN.  Notify MD if PLT < 100 K.    Signed and Held   Signed and Held  Creatinine, serum  (enoxaparin (LOVENOX)    CrCl >/= 30 ml/min)  Once,   R    Comments:  Baseline for enoxaparin therapy IF NOT ALREADY DRAWN.    Signed and Held   Signed and Held  Creatinine, serum  (enoxaparin (LOVENOX)     CrCl >/= 30 ml/min)  Weekly,   R    Comments:  while on enoxaparin therapy    Signed and Held          Vitals/Pain Today's Vitals   05/03/19 1500 05/03/19 1530 05/03/19 1600 05/03/19 1630  BP: (!) 167/65 (!) 145/53 134/68 (!) 130/48  Pulse: (!) 108 100 (!) 112 (!) 112  Resp: (!) 23 (!) 21 (!) 22 (!) 21  Temp:      TempSrc:      SpO2: 100% 100% 100% 100%    Isolation Precautions No active isolations  Medications Medications  insulin regular, human (MYXREDLIN) 100 units/ 100 mL infusion (14 Units/hr Intravenous Rate/Dose Change 05/03/19 1646)  sodium bicarbonate 100 mEq in dextrose 5 % 1,000 mL infusion ( Intravenous New Bag/Given 05/03/19 1549)  sodium chloride 0.9 % bolus 1,000 mL (0 mLs Intravenous Stopped 05/03/19 1354)  insulin aspart (novoLOG) injection 10 Units (10 Units Intravenous Given 05/03/19 1239)  calcium gluconate 1 g in sodium chloride 0.9 % 100 mL IVPB (0 g Intravenous Stopped 05/03/19 1544)    Mobility  walks Moderate fall risk       R Recommendations: See Admitting Provider Note  Report given to:   Additional Notes:

## 2019-05-03 NOTE — ED Notes (Addendum)
Date and time results received: 05/03/19 14:15  Test: Potassium  Critical Value: 6.6   Name of Provider Notified: Latanya Presser. PA   Orders Received? Or Actions Taken?: Will continue to monitor and await new orders

## 2019-05-03 NOTE — ED Provider Notes (Signed)
[facility]-EMERGENCY DEPT Provider Note   CSN: [id number] Arrival date & time: 05/03/19  1117    History   Chief Complaint Chief Complaint  Patient presents with  . Abdominal Pain  . Hyperglycemia    HPI [name] [name] is a 63 y.o. male who presents with abdominal pain, N/V. PMH significant for insulin dependent diabetes, homelessness, CAD, GERD, HTN. He is a poor historian. He states that he has been feeling unwell for several days. He reports chills, epigastric abdominal pain, N/V, and SOB. He started to develop some chest pressure in the ED. He denies fever, cough, diarrhea. He has been urinating normally. He is currently living in a hotel. He reports compliance with his insulin and denies missing doses.      HPI  Past Medical History:  Diagnosis Date  . Barrett's esophagus   . CAD (coronary artery disease)    a. NSTEMI 05/2014 - occluded RCA dominant proximal s/p asp-thrombectomy/DES to RCA, minimal LAD/LCx, EF 50% by cath, 65-70% by echo.  . Depression   . Diabetes type 2, uncontrolled (HCC)   . Former tobacco use   . GERD (gastroesophageal reflux disease)   . Hemorrhoids    hx of  . Hypercholesterolemia   . Hypertension   . MI (myocardial infarction) (HCC)   . Peripheral neuropathy     Patient Active Problem List   Diagnosis Date Noted  . Cerebral thrombosis with cerebral infarction 01/15/2019  . Subarachnoid hemorrhage 01/15/2019  . Stroke-like symptoms 01/14/2019  . Hypoglycemia due to insulin 01/14/2019  . Noncompliance with medications   . Homeless   . DKA (diabetic ketoacidosis) (HCC) 11/27/2018  . Chest pain of uncertain etiology 11/19/2018  . Dizziness 11/19/2018  . Bilateral low back pain without sciatica 04/12/2018  . Vitamin D deficiency 04/07/2018  . Pill dysphagia 04/07/2018  . Enlarged thyroid gland 04/07/2018  . Labile blood glucose 04/07/2018  . Brittle diabetes mellitus (HCC) 04/07/2018  . AKI (acute kidney injury) (HCC)  09/22/2017  . Chronic pain syndrome 08/18/2016  . Diabetic ketoacidosis (HCC) 04/12/2016  . Hip pain, bilateral 08/23/2015  . Chronic neck pain 08/23/2015  . DKA (diabetic ketoacidoses) (HCC) 05/30/2015  . Acute renal failure (HCC) 05/30/2015  . Restless legs 08/31/2014  . Uncontrolled diabetes mellitus type 2 with peripheral artery disease (HCC) 08/10/2014  . Old myocardial infarction 08/10/2014  . CAD (coronary artery disease) 06/18/2014  . Hyperlipidemia 06/18/2014  . NSTEMI (non-ST elevated myocardial infarction) (HCC) 06/14/2014  . N&V (nausea and vomiting) 07/21/2013  . Chronic pain in left shoulder 04/06/2013  . Peripheral neuropathy 02/28/2012  . Depression 12/25/2011  . GERD (gastroesophageal reflux disease) 12/25/2011  . DM (diabetes mellitus), type 2, uncontrolled (HCC) 11/18/2011    Past Surgical History:  Procedure Laterality Date  . LEFT HEART CATHETERIZATION WITH CORONARY ANGIOGRAM N/A 06/14/2014   Procedure: LEFT HEART CATHETERIZATION WITH CORONARY ANGIOGRAM;  Surgeon: Corky Crafts, MD;  Location: [facility] CATH LAB;  Service: Cardiovascular;  Laterality: N/A;  . TONSILLECTOMY          Home Medications    Prior to Admission medications   Medication Sig Start Date End Date Taking? Authorizing Provider  EQ ASPIRIN ADULT LOW DOSE 81 MG EC tablet TAKE ONE TABLET BY MOUTH ONCE DAILY Patient taking differently: Take 81 mg by mouth daily.  07/02/15  Yes Corky Crafts, MD  gabapentin (NEURONTIN) 100 MG capsule TAKE 2 CAPSULES BY MOUTH EVERY MORNING AND 2 CAPSULES BY MOUTH  AT BEDTIME  04/20/19  Yes Jones Baleshomas, Eunice L, NP  HYDROcodone-acetaminophen (NORCO) 7.5-325 MG tablet Take 1 tablet by mouth every 6 (six) hours as needed for moderate pain. 04/20/19  Yes Jones Baleshomas, Eunice L, NP  insulin aspart (NOVOLOG) 100 UNIT/ML injection Inject 0-15 Units into the skin 3 (three) times daily with meals for 30 days. For glucose 121 to 150 use 2 units, for 151 to 200 use 4 units, for  201-250 use 7 units, for 251 to 300 use 9 units, for 301 to 350 use 12 units, for 351 or greater use 15 units. 01/17/19 05/03/19 Yes Arrien, York RamMauricio Daniel, MD  insulin detemir (LEVEMIR) 100 UNIT/ML injection Inject 0.3 mLs (30 Units total) into the skin 2 (two) times daily for 30 days. 02/25/19 05/03/19 Yes Zigmund DanielPowell, A Caldwell Jr., MD  lisinopril (PRINIVIL,ZESTRIL) 5 MG tablet Take 1 tablet (5 mg total) by mouth daily for 30 days. 02/26/19 05/03/19 Yes Zigmund DanielPowell, A Caldwell Jr., MD  metoprolol tartrate (LOPRESSOR) 25 MG tablet TAKE 1/2 TABLET(12.5 MG) BY MOUTH TWICE DAILY Patient taking differently: Take 12.5 mg by mouth 2 (two) times daily.  09/12/18  Yes Sherren MochaShaw, Eva N, MD  pantoprazole (PROTONIX) 40 MG tablet TAKE 1 TABLET BY MOUTH EVERY DAY Patient taking differently: Take 40 mg by mouth daily.  12/29/18  Yes Sherren MochaShaw, Eva N, MD  rosuvastatin (CRESTOR) 40 MG tablet TAKE 1 TABLET(40 MG) BY MOUTH DAILY Patient taking differently: Take 40 mg by mouth daily.  12/29/18  Yes Sherren MochaShaw, Eva N, MD  nitroGLYCERIN (NITROSTAT) 0.4 MG SL tablet Place 1 tablet (0.4 mg total) under the tongue every 5 (five) minutes as needed for chest pain (up to 3 doses). 11/10/16   Corky CraftsVaranasi, Jayadeep S, MD    Family History Family History  Problem Relation Age of Onset  . Hypertension Mother   . Alzheimer's disease Mother   . Alcohol abuse Father   . Cancer Father [id number]       "Throat"  . Diabetes type II Brother   . Cancer Brother 68       throat cancer  . Heart disease Neg Hx     Social History Social History   Tobacco Use  . Smoking status: Former Smoker    Packs/day: 0.25    Years: 3.00    Pack years: 0.75    Last attempt to quit: 11/30/1998    Years since quitting: 20.4  . Smokeless tobacco: Never Used  Substance Use Topics  . Alcohol use: No    Alcohol/week: 1.0 standard drinks    Types: 1 Standard drinks or equivalent per week  . Drug use: No     Allergies   Patient has no known allergies.   Review of Systems  Review of Systems  Constitutional: Positive for chills. Negative for fever.  Respiratory: Positive for shortness of breath. Negative for cough.   Cardiovascular: Positive for chest pain. Negative for palpitations and leg swelling.  Gastrointestinal: Positive for abdominal pain, nausea and vomiting. Negative for diarrhea.  Allergic/Immunologic: Positive for immunocompromised state.  All other systems reviewed and are negative.    Physical Exam Updated Vital Signs BP (!) 167/65   Pulse (!) 108   Temp 98.2 F (36.8 C) (Oral)   Resp (!) 23   SpO2 100%   Physical Exam Vitals signs and nursing note reviewed.  Constitutional:      General: He is in acute distress (mild).     Appearance: He is well-developed. He is ill-appearing.  HENT:  Head: Normocephalic and atraumatic.  Eyes:     General: No scleral icterus.       Right eye: No discharge.        Left eye: No discharge.     Conjunctiva/sclera: Conjunctivae normal.     Pupils: Pupils are equal, round, and reactive to light.  Neck:     Musculoskeletal: Normal range of motion.  Cardiovascular:     Rate and Rhythm: Regular rhythm. Tachycardia present.  Pulmonary:     Effort: Pulmonary effort is normal. Tachypnea present. No respiratory distress.     Breath sounds: Normal breath sounds.  Abdominal:     General: There is no distension.     Palpations: Abdomen is soft.     Tenderness: There is abdominal tenderness (epigastric).  Skin:    General: Skin is warm and dry.  Neurological:     Mental Status: He is alert and oriented to person, place, and time.  Psychiatric:        Behavior: Behavior normal.      ED Treatments / Results  Labs (all labs ordered are listed, but only abnormal results are displayed) Labs Reviewed  COMPREHENSIVE METABOLIC PANEL - Abnormal; Notable for the following components:      Result Value   Sodium 132 (*)    Potassium 6.6 (*)    CO2 8 (*)    Glucose, Bld 668 (*)    Creatinine, Ser 1.46  (*)    Calcium 7.4 (*)    Total Protein 5.8 (*)    Albumin 3.4 (*)    Total Bilirubin 1.5 (*)    GFR calc non Af Amer 52 (*)    GFR calc Af Amer 60 (*)    Anion gap 18 (*)    All other components within normal limits  CBC WITH DIFFERENTIAL/PLATELET - Abnormal; Notable for the following components:   WBC 19.0 (*)    RBC 3.94 (*)    Hemoglobin 11.4 (*)    HCT 37.3 (*)    Neutro Abs 15.9 (*)    Abs Immature Granulocytes 0.09 (*)    All other components within normal limits  BLOOD GAS, VENOUS - Abnormal; Notable for the following components:   pH, Ven 7.114 (*)    pCO2, Ven 25.8 (*)    Bicarbonate 7.9 (*)    Acid-base deficit 20.4 (*)    All other components within normal limits  URINALYSIS, ROUTINE W REFLEX MICROSCOPIC - Abnormal; Notable for the following components:   Color, Urine COLORLESS (*)    Glucose, UA >=500 (*)    Ketones, ur 80 (*)    All other components within normal limits  CBG MONITORING, ED - Abnormal; Notable for the following components:   Glucose-Capillary >600 (*)    All other components within normal limits  CBG MONITORING, ED - Abnormal; Notable for the following components:   Glucose-Capillary >600 (*)    All other components within normal limits  SARS CORONAVIRUS 2 (HOSPITAL ORDER, PERFORMED IN [facility] LAB)  TROPONIN I  CBG MONITORING, ED    EKG EKG Interpretation  Date/Time:  Wednesday May 03 2019 12:12:41 EDT Ventricular Rate:  82 PR Interval:    QRS Duration: 81 QT Interval:  356 QTC Calculation: 416 R Axis:   54 Text Interpretation:  Sinus rhythm ST elevation, consider inferior injury No acute changes No significant change since last tracing Confirmed by Derwood Kaplan ([id number]) on 05/03/2019 1:55:54 PM   Radiology Dg Chest 1 View  Result  Date: 05/03/2019 CLINICAL DATA:  Chest pain. EXAM: CHEST  1 VIEW COMPARISON:  Radiograph February 21, 2019. FINDINGS: The heart size and mediastinal contours are within normal limits. Both lungs  are clear. No pneumothorax or pleural effusion is noted. The visualized skeletal structures are unremarkable. IMPRESSION: No active disease. Electronically Signed   By: Lupita Raider M.D.   On: 05/03/2019 12:44    Procedures Procedures (including critical care time)  CRITICAL CARE Performed by: Bethel Born   Total critical care time: 35 minutes  Critical care time was exclusive of separately billable procedures and treating other patients.  Critical care was necessary to treat or prevent imminent or life-threatening deterioration.  Critical care was time spent personally by me on the following activities: development of treatment plan with patient and/or surrogate as well as nursing, discussions with consultants, evaluation of patient's response to treatment, examination of patient, obtaining history from patient or surrogate, ordering and performing treatments and interventions, ordering and review of laboratory studies, ordering and review of radiographic studies, pulse oximetry and re-evaluation of patient's condition.   Medications Ordered in ED Medications  insulin regular, human (MYXREDLIN) 100 units/ 100 mL infusion (5.4 Units/hr Intravenous New Bag/Given 05/03/19 1409)  calcium gluconate 1 g in sodium chloride 0.9 % 100 mL IVPB (1 g Intravenous New Bag/Given 05/03/19 1503)  sodium bicarbonate 100 mEq in dextrose 5 % 1,000 mL infusion (has no administration in time range)  sodium chloride 0.9 % bolus 1,000 mL (0 mLs Intravenous Stopped 05/03/19 1354)  insulin aspart (novoLOG) injection 10 Units (10 Units Intravenous Given 05/03/19 1239)     Initial Impression / Assessment and Plan / ED Course  I have reviewed the triage vital signs and the nursing notes.  Pertinent labs & imaging results that were available during my care of the patient were reviewed by me and considered in my medical decision making (see chart for details).  63 year old male presents with abdominal pain,  N/V, SOB. He has developed some chest pressure here in the ED. Symptoms feel consistent with when he's had DKA in the past. Unclear trigger. He denies infectious symptoms and reports compliance with insulin. DKA order set was utilized. Fluids and insulin ordered.  CBC is remarkable for significant leukocytosis (19). May be reactive as he does not have signs of infection. He has mild anemia (11.4). CMP is remarkable for hyperkalemia (6.6), low bicarb (8), AKI (SCr 1.46), anion gap (18), elevated bilirubin (1.5). UA has 80 ketones and does not look like there is infection. Venous pH is 7.1. Insulin drip was started which will help with potassium. EKG does not show EKG changes but will give Ca gluconate and bicarb. CXR is negative. COVID was ordered.   Shared visit with Dr. Rhunette Croft. Will request admission. Spoke with Dr. Ella Jubilee who will see pt.  Final Clinical Impressions(s) / ED Diagnoses   Final diagnoses:  Diabetic ketoacidosis without coma associated with other specified diabetes mellitus [facility])    ED Discharge Orders    None       Bethel Born, PA-C 05/03/19 1528    Derwood Kaplan, MD 05/04/19 [phone]

## 2019-05-03 NOTE — Progress Notes (Signed)
Paged MD about multiple orders for fluids. Sodium bicarb at 162ml/hr and NS at 178ml/hr. Do not want to fluid overload the patient. Asked MD to clarify theses orders.

## 2019-05-03 NOTE — H&P (Signed)
History and Physical    LAROY PINTA LID:[id number] DOB: 01-11-1956 DOA: 05/03/2019  PCP: Sherren Mocha, MD   Patient coming from: Home  Chief Complaint:  Nausea and vomiting.   HPI: [name] [name] is a 63 y.o. male with medical history significant of T2DM, dyslipidemia, coronary artery disease, HTN and depression.  Patient reports 2-day history of severe nausea, vomiting and abdominal pain, no improving or worsening factors, associated with polydipsia and polyuria.  Apparently patient left his insulin in his car, exposed to elevated temperatures.  Despite using his insulin he continued to have persistent symptoms, his capillary glucose reached up to 400.  He has not been able to tolerate p.o. intake due to severe gastrointestinal symptoms.  Denies any fevers or chills.  He takes 40 units in the morning and 45 units at night of long-acting insulin, with insulin sliding scale.  ED Course: Patient was found with severe hyperglycemia, positive anion gap, received IV fluids and he was started on IV insulin.  Review of Systems:  1. General: No fevers, no chills, no weight gain or weight loss 2. ENT: No runny nose or sore throat, no hearing disturbances 3. Pulmonary: No dyspnea, cough, wheezing, or hemoptysis 4. Cardiovascular: No angina, claudication, lower extremity edema, pnd or orthopnea 5. Gastrointestinal: Positive nausea and vomiting, no diarrhea or constipation 6. Hematology: No easy bruisability or frequent infections 7. Urology: No dysuria, hematuria or increased urinary frequency 8. Dermatology: No rashes. 9. Neurology: No seizures or paresthesias 10. Musculoskeletal: No joint pain or deformities  Past Medical History:  Diagnosis Date   Barrett's esophagus    CAD (coronary artery disease)    a. NSTEMI 05/2014 - occluded RCA dominant proximal s/p asp-thrombectomy/DES to RCA, minimal LAD/LCx, EF 50% by cath, 65-70% by echo.   Depression    Diabetes type 2, uncontrolled (HCC)     Former tobacco use    GERD (gastroesophageal reflux disease)    Hemorrhoids    hx of   Hypercholesterolemia    Hypertension    MI (myocardial infarction) (HCC)    Peripheral neuropathy     Past Surgical History:  Procedure Laterality Date   LEFT HEART CATHETERIZATION WITH CORONARY ANGIOGRAM N/A 06/14/2014   Procedure: LEFT HEART CATHETERIZATION WITH CORONARY ANGIOGRAM;  Surgeon: Corky Crafts, MD;  Location: [facility] CATH LAB;  Service: Cardiovascular;  Laterality: N/A;   TONSILLECTOMY       reports that he quit smoking about 20 years ago. He has a 0.75 pack-year smoking history. He has never used smokeless tobacco. He reports that he does not drink alcohol or use drugs.  No Known Allergies  Family History  Problem Relation Age of Onset   Hypertension Mother    Alzheimer's disease Mother    Alcohol abuse Father    Cancer Father 39       "Throat"   Diabetes type II Brother    Cancer Brother 68       throat cancer   Heart disease Neg Hx      Prior to Admission medications   Medication Sig Start Date End Date Taking? Authorizing Provider  EQ ASPIRIN ADULT LOW DOSE 81 MG EC tablet TAKE ONE TABLET BY MOUTH ONCE DAILY Patient taking differently: Take 81 mg by mouth daily.  07/02/15  Yes Corky Crafts, MD  gabapentin (NEURONTIN) 100 MG capsule TAKE 2 CAPSULES BY MOUTH EVERY MORNING AND 2 CAPSULES BY MOUTH  AT BEDTIME 04/20/19  Yes Jones Bales, NP  HYDROcodone-acetaminophen (  NORCO) 7.5-325 MG tablet Take 1 tablet by mouth every 6 (six) hours as needed for moderate pain. 04/20/19  Yes Jones Baleshomas, Eunice L, NP  insulin aspart (NOVOLOG) 100 UNIT/ML injection Inject 0-15 Units into the skin 3 (three) times daily with meals for 30 days. For glucose 121 to 150 use 2 units, for 151 to 200 use 4 units, for 201-250 use 7 units, for 251 to 300 use 9 units, for 301 to 350 use 12 units, for 351 or greater use 15 units. 01/17/19 05/03/19 Yes Henrik Orihuela, York RamMauricio Daniel, MD  insulin  detemir (LEVEMIR) 100 UNIT/ML injection Inject 0.3 mLs (30 Units total) into the skin 2 (two) times daily for 30 days. 02/25/19 05/03/19 Yes Zigmund DanielPowell, A Caldwell Jr., MD  lisinopril (PRINIVIL,ZESTRIL) 5 MG tablet Take 1 tablet (5 mg total) by mouth daily for 30 days. 02/26/19 05/03/19 Yes Zigmund DanielPowell, A Caldwell Jr., MD  metoprolol tartrate (LOPRESSOR) 25 MG tablet TAKE 1/2 TABLET(12.5 MG) BY MOUTH TWICE DAILY Patient taking differently: Take 12.5 mg by mouth 2 (two) times daily.  09/12/18  Yes Sherren MochaShaw, Eva N, MD  pantoprazole (PROTONIX) 40 MG tablet TAKE 1 TABLET BY MOUTH EVERY DAY Patient taking differently: Take 40 mg by mouth daily.  12/29/18  Yes Sherren MochaShaw, Eva N, MD  rosuvastatin (CRESTOR) 40 MG tablet TAKE 1 TABLET(40 MG) BY MOUTH DAILY Patient taking differently: Take 40 mg by mouth daily.  12/29/18  Yes Sherren MochaShaw, Eva N, MD  nitroGLYCERIN (NITROSTAT) 0.4 MG SL tablet Place 1 tablet (0.4 mg total) under the tongue every 5 (five) minutes as needed for chest pain (up to 3 doses). 11/10/16   Corky CraftsVaranasi, Jayadeep S, MD    Physical Exam: Vitals:   05/03/19 1530 05/03/19 1600 05/03/19 1630 05/03/19 1700  BP: (!) 145/53 134/68 (!) 130/48 (!) 160/77  Pulse: 100 (!) 112 (!) 112 (!) 120  Resp: (!) 21 (!) 22 (!) 21 (!) 21  Temp:      TempSrc:      SpO2: 100% 100% 100% 100%    Vitals:   05/03/19 1530 05/03/19 1600 05/03/19 1630 05/03/19 1700  BP: (!) 145/53 134/68 (!) 130/48 (!) 160/77  Pulse: 100 (!) 112 (!) 112 (!) 120  Resp: (!) 21 (!) 22 (!) 21 (!) 21  Temp:      TempSrc:      SpO2: 100% 100% 100% 100%   General: deconditioned and ill looking appearing Neurology: Awake and alert, non focal Head and Neck. Head normocephalic. Neck supple with no adenopathy or thyromegaly.   E ENT: no pallor, no icterus, oral mucosa dry.  Cardiovascular: No JVD. S1-S2 present, rhythmic, no gallops, rubs, or murmurs. No lower extremity edema. Pulmonary: positive breath sounds bilaterally, adequate air movement, no wheezing,  rhonchi or rales. Gastrointestinal. Abdomen with no organomegaly, non tender, no rebound or guarding Skin. No rashes Musculoskeletal: no joint deformities    Labs on Admission: I have personally reviewed following labs and imaging studies  CBC: Recent Labs  Lab 05/03/19 1320  WBC 19.0*  NEUTROABS 15.9*  HGB 11.4*  HCT 37.3*  MCV 94.7  PLT 296   Basic Metabolic Panel: Recent Labs  Lab 05/03/19 1320  NA 132*  K 6.6*  CL 106  CO2 8*  GLUCOSE 668*  BUN 19  CREATININE 1.46*  CALCIUM 7.4*   GFR: Estimated Creatinine Clearance: 66.7 mL/min (A) (by C-G formula based on SCr of 1.46 mg/dL (H)). Liver Function Tests: Recent Labs  Lab 05/03/19 1320  AST 20  ALT  15  ALKPHOS 113  BILITOT 1.5*  PROT 5.8*  ALBUMIN 3.4*   No results for input(s): LIPASE, AMYLASE in the last 168 hours. No results for input(s): AMMONIA in the last 168 hours. Coagulation Profile: No results for input(s): INR, PROTIME in the last 168 hours. Cardiac Enzymes: Recent Labs  Lab 05/03/19 1320  TROPONINI <0.03   BNP (last 3 results) No results for input(s): PROBNP in the last 8760 hours. HbA1C: No results for input(s): HGBA1C in the last 72 hours. CBG: Recent Labs  Lab 05/03/19 1405 05/03/19 1514 05/03/19 1645  GLUCAP >600* >600* 526*   Lipid Profile: No results for input(s): CHOL, HDL, LDLCALC, TRIG, CHOLHDL, LDLDIRECT in the last 72 hours. Thyroid Function Tests: No results for input(s): TSH, T4TOTAL, FREET4, T3FREE, THYROIDAB in the last 72 hours. Anemia Panel: No results for input(s): VITAMINB12, FOLATE, FERRITIN, TIBC, IRON, RETICCTPCT in the last 72 hours. Urine analysis:    Component Value Date/Time   COLORURINE COLORLESS (A) 05/03/2019 1216   APPEARANCEUR CLEAR 05/03/2019 1216   LABSPEC 1.018 05/03/2019 1216   PHURINE 5.0 05/03/2019 1216   GLUCOSEU >=500 (A) 05/03/2019 1216   HGBUR NEGATIVE 05/03/2019 1216   BILIRUBINUR NEGATIVE 05/03/2019 1216   BILIRUBINUR negative  03/11/2018 1613   KETONESUR 80 (A) 05/03/2019 1216   PROTEINUR NEGATIVE 05/03/2019 1216   UROBILINOGEN 0.2 03/11/2018 1613   UROBILINOGEN 0.2 06/14/2014 0809   NITRITE NEGATIVE 05/03/2019 1216   LEUKOCYTESUR NEGATIVE 05/03/2019 1216    Radiological Exams on Admission: Dg Chest 1 View  Result Date: 05/03/2019 CLINICAL DATA:  Chest pain. EXAM: CHEST  1 VIEW COMPARISON:  Radiograph February 21, 2019. FINDINGS: The heart size and mediastinal contours are within normal limits. Both lungs are clear. No pneumothorax or pleural effusion is noted. The visualized skeletal structures are unremarkable. IMPRESSION: No active disease. Electronically Signed   By: Lupita Raider M.D.   On: 05/03/2019 12:44    EKG: Independently reviewed. 82 bpm, with normal axis, normal intervals, sinus, with no st segment or t wave changes.   Assessment/Plan Principal Problem:   DKA (diabetic ketoacidoses) (HCC) Active Problems:   DM (diabetes mellitus), type 2, uncontrolled (HCC)   Depression   GERD (gastroesophageal reflux disease)   Acute renal failure (HCC)  63 year old male with type 2 diabetes mellitus, recent hospitalization for DKA and hypoglycemia who apparently left his insulin exposed to elevated temperatures.  For the last 48 hours persistent nausea, vomiting and decreased p.o. intake, elevated glucose, polyuria and polydipsia.  On his initial physical examination pressure 134/68, heart rate 112, respiratory 22, oxygen saturation 99%, he has dry mucous membranes, his lungs are clear to auscultation bilaterally, heart S1-S2 present and rhythmic tachycardic, abdomen soft, no lower extremity edema.  Sodium 132, potassium 6.6, chloride 106, bicarb 8, glucose 668, BUN 19, creatinine 1.46, white count 19.0, hemoglobin 11.4, hematocrit 37.3, platelets 296.  SARS COVID-19 negative.  Urinalysis more than 500 glucose.  Chest radiograph with no infiltrates.  Patient will be admitted to the hospital with the working  diagnosis of diabetes ketoacidosis.  1. DKA. Will admit patient to the progressive care unit, will continue insulin drip, monitor basic metabolic panel every 4 H, will plan to start IV dextrose when glucose less than 250 mg/dl. Continue insuoin IV until closing of anion gap.  Close follow-up on potassium with aggressive correction.  Target K is 4.  2.  Type 2 diabetes mellitus.  Patient had frequent hospitalizations for uncontrolled type 2 diabetes mellitus, will  continue management of DKA, will need close follow-up as an outpatient.  3.  Diabetic neuropathy.  Continue gabapentin.  4.  Hypertension.  Continue metoprolol, 12.5 mg twice daily, hold the lisinopril for now.  5.  Acute kidney injury with hyperkalemia.  Continue aggressive fluid resuscitation with isotonic saline at 100 mils per hour, insulin therapy per DKA protocol, follow-up kidney function every 4 hours.  6.  Dyslipidemia.  Continue rosuvastatin.    DVT prophylaxis:  Enoxaparin  Code Status:  full  Family Communication: no family at the bedside   Disposition Plan: step down unit    Consults called: non   Admission status: Observation    Charise Leinbach Annett Gula MD [facility]   05/03/2019, 5:18 PM

## 2019-05-04 LAB — BASIC METABOLIC PANEL WITH GFR
Anion gap: 8 (ref 5–15)
BUN: 19 mg/dL (ref 6–20)
CO2: 19 mmol/L — ABNORMAL LOW (ref 22–32)
Calcium: 8.4 mg/dL — ABNORMAL LOW (ref 8.9–10.3)
Chloride: 108 mmol/L (ref 98–111)
Creatinine, Ser: 1.37 mg/dL — ABNORMAL HIGH (ref 0.61–1.24)
GFR calc Af Amer: >60 mL/min (ref >60)
GFR calc non Af Amer: 56 mL/min — ABNORMAL LOW (ref >60)
Glucose, Bld: 156 mg/dL — ABNORMAL HIGH (ref 70–99)
Potassium: 4.3 mmol/L (ref 3.5–5.1)
Sodium: 135 mmol/L (ref 135–145)

## 2019-05-04 LAB — GLUCOSE, CAPILLARY
Glucose-Capillary: 104 mg/dL — ABNORMAL HIGH (ref 70–99)
Glucose-Capillary: 114 mg/dL — ABNORMAL HIGH (ref 70–99)
Glucose-Capillary: 125 mg/dL — ABNORMAL HIGH (ref 70–99)
Glucose-Capillary: 133 mg/dL — ABNORMAL HIGH (ref 70–99)
Glucose-Capillary: 139 mg/dL — ABNORMAL HIGH (ref 70–99)
Glucose-Capillary: 169 mg/dL — ABNORMAL HIGH (ref 70–99)
Glucose-Capillary: 171 mg/dL — ABNORMAL HIGH (ref 70–99)
Glucose-Capillary: 182 mg/dL — ABNORMAL HIGH (ref 70–99)
Glucose-Capillary: 183 mg/dL — ABNORMAL HIGH (ref 70–99)
Glucose-Capillary: 183 mg/dL — ABNORMAL HIGH (ref 70–99)
Glucose-Capillary: 196 mg/dL — ABNORMAL HIGH (ref 70–99)
Glucose-Capillary: 197 mg/dL — ABNORMAL HIGH (ref 70–99)
Glucose-Capillary: 219 mg/dL — ABNORMAL HIGH (ref 70–99)
Glucose-Capillary: 232 mg/dL — ABNORMAL HIGH (ref 70–99)
Glucose-Capillary: 377 mg/dL — ABNORMAL HIGH (ref 70–99)
Glucose-Capillary: 41 mg/dL — CL (ref 70–99)
Glucose-Capillary: 62 mg/dL — ABNORMAL LOW (ref 70–99)
Glucose-Capillary: 87 mg/dL (ref 70–99)
Glucose-Capillary: 91 mg/dL (ref 70–99)

## 2019-05-04 LAB — BASIC METABOLIC PANEL
Anion gap: 10 (ref 5–15)
Anion gap: 7 (ref 5–15)
BUN: 16 mg/dL (ref 6–20)
BUN: 15 mg/dL (ref 6–20)
CO2: 17 mmol/L — ABNORMAL LOW (ref 22–32)
CO2: 19 mmol/L — ABNORMAL LOW (ref 22–32)
Calcium: 8.5 mg/dL — ABNORMAL LOW (ref 8.9–10.3)
Calcium: 8.5 mg/dL — ABNORMAL LOW (ref 8.9–10.3)
Chloride: 108 mmol/L (ref 98–111)
Chloride: 108 mmol/L (ref 98–111)
Creatinine, Ser: 1.25 mg/dL — ABNORMAL HIGH (ref 0.61–1.24)
Creatinine, Ser: 1.16 mg/dL (ref 0.61–1.24)
GFR calc Af Amer: >60 mL/min (ref >60)
GFR calc Af Amer: >60 mL/min (ref >60)
GFR calc non Af Amer: >60 mL/min (ref >60)
GFR calc non Af Amer: >60 mL/min (ref >60)
Glucose, Bld: 183 mg/dL — ABNORMAL HIGH (ref 70–99)
Glucose, Bld: 112 mg/dL — ABNORMAL HIGH (ref 70–99)
Potassium: 4.2 mmol/L (ref 3.5–5.1)
Potassium: 3.9 mmol/L (ref 3.5–5.1)
Sodium: 135 mmol/L (ref 135–145)
Sodium: 134 mmol/L — ABNORMAL LOW (ref 135–145)

## 2019-05-04 MED ORDER — INSULIN ASPART 100 UNIT/ML ~~LOC~~ SOLN
0.0000 [IU] | Freq: Three times a day (TID) | SUBCUTANEOUS | Status: DC
  Administered 2019-05-05 – 2019-05-06 (×2): 3 [IU] via SUBCUTANEOUS
  Administered 2019-05-06: 5 [IU] via SUBCUTANEOUS

## 2019-05-04 MED ORDER — INSULIN DETEMIR 100 UNIT/ML ~~LOC~~ SOLN
20.0000 [IU] | Freq: Two times a day (BID) | SUBCUTANEOUS | Status: DC
  Administered 2019-05-04 – 2019-05-06 (×4): 20 [IU] via SUBCUTANEOUS
  Filled 2019-05-04 (×5): qty 0.2

## 2019-05-04 MED ORDER — INSULIN DETEMIR 100 UNIT/ML ~~LOC~~ SOLN
40.0000 [IU] | Freq: Two times a day (BID) | SUBCUTANEOUS | Status: DC
  Administered 2019-05-04: 40 [IU] via SUBCUTANEOUS
  Filled 2019-05-04 (×2): qty 0.4

## 2019-05-04 MED ORDER — DEXTROSE 5 % IV SOLN
INTRAVENOUS | Status: DC
  Administered 2019-05-04: 09:00:00 via INTRAVENOUS

## 2019-05-04 MED ORDER — INSULIN ASPART 100 UNIT/ML ~~LOC~~ SOLN
0.0000 [IU] | Freq: Three times a day (TID) | SUBCUTANEOUS | Status: DC
  Administered 2019-05-04: 3 [IU] via SUBCUTANEOUS

## 2019-05-04 NOTE — Progress Notes (Addendum)
PROGRESS NOTE    TRUE COWDIN  XJD:[id number] DOB: 05-23-1956 DOA: 05/03/2019 PCP: Sherren Mocha, MD    Brief Narrative:  63 year old male with type 2 diabetes mellitus, recent hospitalization for DKA and hypoglycemia who apparently left his insulin exposed to elevated temperatures.  For the last 48 hours persistent nausea, vomiting and decreased p.o. intake, elevated glucose, polyuria and polydipsia.  On his initial physical examination pressure 134/68, heart rate 112, respiratory 22, oxygen saturation 99%, he has dry mucous membranes, his lungs are clear to auscultation bilaterally, heart S1-S2 present and rhythmic tachycardic, abdomen soft, no lower extremity edema.  Sodium 132, potassium 6.6, chloride 106, bicarb 8, glucose 668, BUN 19, creatinine 1.46, white count 19.0, hemoglobin 11.4, hematocrit 37.3, platelets 296.  SARS COVID-19 negative.  Urinalysis more than 500 glucose.  Chest radiograph with no infiltrates.  Patient will be admitted to the hospital with the working diagnosis of diabetes ketoacidosis.   Assessment & Plan:   Principal Problem:   DKA (diabetic ketoacidoses) (HCC) Active Problems:   DM (diabetes mellitus), type 2, uncontrolled (HCC)   Depression   GERD (gastroesophageal reflux disease)   Acute renal failure (HCC)   1. DKA. Improved anion gap and serum glucose, patient is tolerating po well but this am continue on insulin drip. Over last 8 H has required about 25 units, estimating 100 U requirement for 24 H. Will transition to sq insulin 40 units bid, starting this am. Advance diet as tolerated to regular, continue glucose cover and monitoring with insulin sliding scale. As needed IV antiemetics. Decrease rate of IV fluids, and plan to discontinue when patient off insulin drip. Patient continue to be tachycardic and risk of worsening hyperglycemia.   2.  Type 2 diabetes mellitus.  Uncontrolled diease, had 2 recent hospitalizations one for DKA and one for  hypoglycemia. Will need diabetic education.   3.  Diabetic neuropathy.  On gabapentin.  4.  Hypertension.  On metoprolol, 12.5 mg twice daily, blood pressure this am 153 systolic, will continue to hold on ace inh for now.   5.  Acute kidney injury with hyperkalemia.  Renal function with serum cr down to 1,25 from 1,35, K down to 4,2, will follow on renal panel this am.   6.  Dyslipidemia.  On rosuvastatin.  7. Obesity. Calculated bmi is 34.   DVT prophylaxis: enoxaparin   Code Status:  full Family Communication: no family at the bedside  Disposition Plan/ discharge barriers: pending clinical improvement.   Body mass index is 34.18 kg/m. Malnutrition Type:      Malnutrition Characteristics:      Nutrition Interventions:     RN Pressure Injury Documentation:     Consultants:     Procedures:     Antimicrobials:       Subjective: Patient feeling better but not yet back to baseline, no nausea or vomiting, no chest pain or dyspnea. Continue to be tachycardic.   Objective: Vitals:   05/04/19 0342 05/04/19 0400 05/04/19 0500 05/04/19 0600  BP:  (!) 145/65 (!) 120/38 (!) 130/55  Pulse:  93 98 95  Resp:  13 16 16   Temp: 98.6 F (37 C)     TempSrc: Oral     SpO2:  99% 99% 98%  Weight:      Height:        Intake/Output Summary (Last 24 hours) at 05/04/2019 0753 Last data filed at 05/04/2019 0700 Gross per 24 hour  Intake 1520.56 ml  Output 600 ml  Net 920.56 ml   Filed Weights   05/03/19 1818  Weight: 105 kg    Examination:   General: deconditioned and ill looking appearing  Neurology: Awake and alert, non focal  E ENT: positive pallor, no icterus, oral mucosa dry  Cardiovascular: No JVD. S1-S2 present, rhythmic, no gallops, rubs, or murmurs. No lower extremity edema. Pulmonary: positive breath sounds bilaterally, adequate air movement, no wheezing, rhonchi or rales. Gastrointestinal. Abdomen with no organomegaly, non tender, no rebound or  guarding Skin. No rashes Musculoskeletal: no joint deformities     Data Reviewed: I have personally reviewed following labs and imaging studies  CBC: Recent Labs  Lab 05/03/19 1320  WBC 19.0*  NEUTROABS 15.9*  HGB 11.4*  HCT 37.3*  MCV 94.7  PLT 296   Basic Metabolic Panel: Recent Labs  Lab 05/03/19 1320 05/03/19 1828 05/03/19 2049 05/04/19 0129  NA 132* 135 135 135  K 6.6* 5.3* 4.9 4.3  CL 106 104 108 108  CO2 8* 10* 13* 19*  GLUCOSE 668* 354* 190* 156*  BUN 19 22* 21* 19  CREATININE 1.46* 1.73* 1.50* 1.37*  CALCIUM 7.4* 8.9 8.8* 8.4*   GFR: Estimated Creatinine Clearance: 68.5 mL/min (A) (by C-G formula based on SCr of 1.37 mg/dL (H)). Liver Function Tests: Recent Labs  Lab 05/03/19 1320  AST 20  ALT 15  ALKPHOS 113  BILITOT 1.5*  PROT 5.8*  ALBUMIN 3.4*   No results for input(s): LIPASE, AMYLASE in the last 168 hours. No results for input(s): AMMONIA in the last 168 hours. Coagulation Profile: No results for input(s): INR, PROTIME in the last 168 hours. Cardiac Enzymes: Recent Labs  Lab 05/03/19 1320  TROPONINI <0.03   BNP (last 3 results) No results for input(s): PROBNP in the last 8760 hours. HbA1C: No results for input(s): HGBA1C in the last 72 hours. CBG: Recent Labs  Lab 05/04/19 0306 05/04/19 0359 05/04/19 0511 05/04/19 0611 05/04/19 0659  GLUCAP 169* 183* 219* 196* 171*   Lipid Profile: No results for input(s): CHOL, HDL, LDLCALC, TRIG, CHOLHDL, LDLDIRECT in the last 72 hours. Thyroid Function Tests: No results for input(s): TSH, T4TOTAL, FREET4, T3FREE, THYROIDAB in the last 72 hours. Anemia Panel: No results for input(s): VITAMINB12, FOLATE, FERRITIN, TIBC, IRON, RETICCTPCT in the last 72 hours.    Radiology Studies: I have reviewed all of the imaging during this hospital visit personally     Scheduled Meds: . Chlorhexidine Gluconate Cloth  6 each Topical Daily  . enoxaparin (LOVENOX) injection  40 mg Subcutaneous  Q24H  . gabapentin  200 mg Oral BID  . metoprolol tartrate  12.5 mg Oral BID  . pantoprazole  40 mg Oral Daily  . rosuvastatin  40 mg Oral Daily   Continuous Infusions: . sodium chloride    . dextrose 5 % and 0.45% NaCl 150 mL/hr at 05/04/19 0700  . insulin 3.3 mL/hr at 05/04/19 0700     LOS: 0 days        Crystian Frith Annett Gula, MD

## 2019-05-04 NOTE — Progress Notes (Signed)
CRITICAL VALUE ALERT  Critical Value:  41  Date & Time Notied:  05/04/2019 1654  Provider Notified: None at this time   Orders Received/Actions taken: followed hypoglycemic protocol, 8oz apple juice given.   Patient reports he "doesn't feel too good, tired, flat affect"   RECHECK   CRITICAL VALUE ALERT  Critical Value:  74  Date & Time Notied:  05/04/2019 1728  Provider Notified: MD M. Arrien    Orders Received/Actions taken: followed hypoglycemic protocol, 8oz apple juice given.    Patient reports feeling better, sitting in bedside chair, eating dinner.   New orders received from MD for PM coverage.   BS RECHECK Value 87 1810

## 2019-05-05 LAB — BASIC METABOLIC PANEL WITH GFR
Anion gap: 9 (ref 5–15)
BUN: 11 mg/dL (ref 6–20)
CO2: 20 mmol/L — ABNORMAL LOW (ref 22–32)
Calcium: 8.7 mg/dL — ABNORMAL LOW (ref 8.9–10.3)
Chloride: 107 mmol/L (ref 98–111)
Creatinine, Ser: 0.95 mg/dL (ref 0.61–1.24)
GFR calc Af Amer: >60 mL/min
GFR calc non Af Amer: >60 mL/min
Glucose, Bld: 148 mg/dL — ABNORMAL HIGH (ref 70–99)
Potassium: 3.8 mmol/L (ref 3.5–5.1)
Sodium: 136 mmol/L (ref 135–145)

## 2019-05-05 LAB — GLUCOSE, CAPILLARY
Glucose-Capillary: 114 mg/dL — ABNORMAL HIGH (ref 70–99)
Glucose-Capillary: 100 mg/dL — ABNORMAL HIGH (ref 70–99)
Glucose-Capillary: 229 mg/dL — ABNORMAL HIGH (ref 70–99)
Glucose-Capillary: 55 mg/dL — ABNORMAL LOW (ref 70–99)
Glucose-Capillary: 61 mg/dL — ABNORMAL LOW (ref 70–99)
Glucose-Capillary: 84 mg/dL (ref 70–99)

## 2019-05-05 LAB — BASIC METABOLIC PANEL
Anion gap: 6 (ref 5–15)
BUN: 12 mg/dL (ref 6–20)
CO2: 20 mmol/L — ABNORMAL LOW (ref 22–32)
Calcium: 7.8 mg/dL — ABNORMAL LOW (ref 8.9–10.3)
Chloride: 99 mmol/L (ref 98–111)
Creatinine, Ser: 0.97 mg/dL (ref 0.61–1.24)
GFR calc Af Amer: >60 mL/min (ref >60)
GFR calc non Af Amer: >60 mL/min (ref >60)
Glucose, Bld: 101 mg/dL — ABNORMAL HIGH (ref 70–99)
Potassium: 3.4 mmol/L — ABNORMAL LOW (ref 3.5–5.1)
Sodium: 125 mmol/L — ABNORMAL LOW (ref 135–145)

## 2019-05-05 MED ORDER — GLUCOSE 4 G PO CHEW
CHEWABLE_TABLET | ORAL | Status: AC
  Administered 2019-05-05: 1
  Filled 2019-05-05: qty 1

## 2019-05-05 MED ORDER — INSULIN DETEMIR 100 UNIT/ML ~~LOC~~ SOLN: 30.0000 [IU] | Freq: Two times a day (BID) | SUBCUTANEOUS | 0 refills | Status: DC

## 2019-05-05 MED ORDER — INSULIN ASPART 100 UNIT/ML ~~LOC~~ SOLN: 0.0000 [IU] | Freq: Three times a day (TID) | SUBCUTANEOUS | 0 refills | Status: DC

## 2019-05-05 NOTE — Progress Notes (Signed)
Inpatient Diabetes Program Recommendations  AACE/ADA: New Consensus Statement on Inpatient Glycemic Control (2015)  Target Ranges:  Prepandial:   less than 140 mg/dL      Peak postprandial:   less than 180 mg/dL (1-2 hours)      Critically ill patients:  140 - 180 mg/dL   Lab Results  Component Value Date   GLUCAP 100 (H) 05/05/2019   HGBA1C 10.0 (H) 01/14/2019    Review of Glycemic Control  To be discharged on:  Levemir 30 units bid Novolog 0-15 units tidwc  Diabetes Coordinator has spoken to pt on 2/17 and 3/25 about his diabetes control and HgbA1C of 10.0%.  Talked with pt about importance of f/u with Dr. [name]. Pt seems depressed, states he had lows once in awhile, but not very often at home. States that he wants to stay another night in the hospital. Pt continued to talk about his stomach hurting and states it hurts at home, also.    Talked about lifestyle modifications with diet and exercise. Pt said he sometimes only eats 1-2 meals/day, due to abdominal pain. Very little exercise. States he checks blood sugars daily (2-3x) and stressed importance of f/u with MD. York Spaniel he had no other questions regarding his diabetes. Discussed 4th ED/Admission in the past 6 months.   Thank you. Ailene Ards, RD, LDN, CDE Inpatient Diabetes Coordinator [phone]

## 2019-05-05 NOTE — Progress Notes (Signed)
Pt does not have cardiac monitoring orders. Pt is med-surg status.  Pt removed from monitor.  Tommy Medal RN

## 2019-05-05 NOTE — Discharge Summary (Signed)
Physician Discharge Summary  [name] [name] JYN:[id number] DOB: 1956/02/04 DOA: 05/03/2019  PCP: Sherren Mocha, MD  Admit date: 05/03/2019 Discharge date: 05/05/2019  Admitted From: Home  Disposition:  Home   Recommendations for Outpatient Follow-up and new medication changes:  1. Follow up with Dr. [name] in 7 days.  2. Please obtain BMP/CBC in one week 3. Please follow up on the following pending results:  Home Health: no  Equipment/Devices: no   Discharge Condition: stable  CODE STATUS: Full  Diet recommendation:  Heart healthy and diabetic prudent.   Brief/Interim Summary: 63 year old male with type 2 diabetes mellitus, recent hospitalization for DKA and hypoglycemia who apparently left his insulin exposed to elevated temperatures. For the last 48 hours prior to admission he had persistent nausea, vomiting and decreased p.o. intake, elevated glucose, polyuria and polydipsia. On his initial physical examination blood pressure 134/68, heart rate 112, respiratory rate 22, oxygen saturation 99%, he had dry mucous membranes, his lungswereclear to auscultation bilaterally, heart S1-S2 present and rhythmic tachycardic, abdomen soft, no lower extremity edema. Sodium 132, potassium 6.6, chloride 106, bicarb 8, glucose 668, BUN 19, creatinine 1.46, anion gap was 18, white count 19.0, hemoglobin 11.4, hematocrit 37.3, platelets 296.SARS COVID-19 negative. Urinalysis more than 500 glucose. Chest radiograph with no infiltrates.  Patient was  admitted to the hospitalwith theworking diagnosis of diabetes ketoacidosis.  1.  Diabetes ketoacidosis.  Patient was admitted to the stepdown unit, he was placed on intravenous fluids and a continuous infusion of IV insulin, he had close monitoring of kidney function, electrolytes and anion gap.  He responded well to medical therapy, his anion gap closed, his diet was advanced, and he was successfully transitioned to subcutaneous insulin.  His discharge fasting  glucose is 101 mg/dl, after receiving 60 units of long-acting insulin, along with insulin sliding scale.  Patient will be discharge home, to resume his usual insulin regimen with 30 units of long-acting insulin twice daily plus insulin sliding scale.  A new prescription will be provided for insulin.  2.  Type 2 diabetes mellitus.  Patient had frequent hospitalizations for uncontrolled diabetes, diabetes ketoacidosis and hypoglycemia.  He will need close follow-up as an outpatient.  3.  Acute kidney injury with hyperkalemia.  Patient received IV fluids with improvement of kidney function, his discharge creatinine 0.97, potassium 3.4.  4.  Diabetic neuropathy.  Continue gabapentin.  5.  Hypertension.  Lisinopril was held during his hospitalization to prevent further kidney injury and hyperkalemia.  He was continued on with metoprolol.  At discharge he will resume ACE inhibitors.  6.  Dyslipidemia.  Continue statin therapy with rosuvastatin.  7.  Obesity.  His calculated BMI is 34, he will need close follow-up as an outpatient.  Discharge Diagnoses:  Principal Problem:   DKA (diabetic ketoacidoses) (HCC) Active Problems:   DM (diabetes mellitus), type 2, uncontrolled (HCC)   Depression   GERD (gastroesophageal reflux disease)   Acute renal failure (HCC)    Discharge Instructions   Allergies as of 05/05/2019   No Known Allergies     Medication List    TAKE these medications   EQ Aspirin Adult Low Dose 81 MG EC tablet Generic drug:  aspirin TAKE ONE TABLET BY MOUTH ONCE DAILY What changed:  how much to take   gabapentin 100 MG capsule Commonly known as:  NEURONTIN TAKE 2 CAPSULES BY MOUTH EVERY MORNING AND 2 CAPSULES BY MOUTH  AT BEDTIME   HYDROcodone-acetaminophen 7.5-325 MG tablet Commonly known as:  NORCO Take 1 tablet by mouth every 6 (six) hours as needed for moderate pain.   insulin aspart 100 UNIT/ML injection Commonly known as:  novoLOG Inject 0-15 Units into the  skin 3 (three) times daily with meals for 30 days. For glucose 121 to 150 use 2 units, for 151 to 200 use 4 units, for 201-250 use 7 units, for 251 to 300 use 9 units, for 301 to 350 use 12 units, for 351 or greater use 15 units.   insulin detemir 100 UNIT/ML injection Commonly known as:  LEVEMIR Inject 0.3 mLs (30 Units total) into the skin 2 (two) times daily for 30 days.   lisinopril 5 MG tablet Commonly known as:  ZESTRIL Take 1 tablet (5 mg total) by mouth daily for 30 days.   metoprolol tartrate 25 MG tablet Commonly known as:  LOPRESSOR TAKE 1/2 TABLET(12.5 MG) BY MOUTH TWICE DAILY What changed:  See the new instructions.   nitroGLYCERIN 0.4 MG SL tablet Commonly known as:  NITROSTAT Place 1 tablet (0.4 mg total) under the tongue every 5 (five) minutes as needed for chest pain (up to 3 doses).   pantoprazole 40 MG tablet Commonly known as:  PROTONIX TAKE 1 TABLET BY MOUTH EVERY DAY   rosuvastatin 40 MG tablet Commonly known as:  CRESTOR TAKE 1 TABLET(40 MG) BY MOUTH DAILY What changed:  See the new instructions.       No Known Allergies  Consultations:     Procedures/Studies: Dg Chest 1 View  Result Date: 05/03/2019 CLINICAL DATA:  Chest pain. EXAM: CHEST  1 VIEW COMPARISON:  Radiograph February 21, 2019. FINDINGS: The heart size and mediastinal contours are within normal limits. Both lungs are clear. No pneumothorax or pleural effusion is noted. The visualized skeletal structures are unremarkable. IMPRESSION: No active disease. Electronically Signed   By: Lupita Raider M.D.   On: 05/03/2019 12:44      Procedures:   Subjective: Patient is feeling better, no nausea or vomiting, no chest pain or dyspnea. No further tachycardia.   Discharge Exam: Vitals:   05/05/19 0000 05/05/19 0400  BP:    Pulse:    Resp:    Temp: 98.1 F (36.7 C) 98.2 F (36.8 C)  SpO2:     Vitals:   05/04/19 2100 05/04/19 2200 05/05/19 0000 05/05/19 0400  BP: 121/61     Pulse: 75  80    Resp:      Temp:   98.1 F (36.7 C) 98.2 F (36.8 C)  TempSrc:   Oral Oral  SpO2: 97% 99%    Weight:      Height:        General: Not in pain or dyspnea  Neurology: Awake and alert, non focal  E ENT: no pallor, no icterus, oral mucosa moist Cardiovascular: No JVD. S1-S2 present, rhythmic, no gallops, rubs, or murmurs. No lower extremity edema. Pulmonary: vesicular breath sounds bilaterally, adequate air movement, no wheezing, rhonchi or rales. Gastrointestinal. Abdomen with, no organomegaly, non tender, no rebound or guarding Skin. No rashes Musculoskeletal: no joint deformities   The results of significant diagnostics from this hospitalization (including imaging, microbiology, ancillary and laboratory) are listed below for reference.     Microbiology: Recent Results (from the past 240 hour(s))  SARS Coronavirus 2 (CEPHEID - Performed in [facility] hospital lab), Hosp Order     Status: None   Collection Time: 05/03/19  3:45 PM  Result Value Ref Range Status   SARS Coronavirus 2  NEGATIVE NEGATIVE Final    Comment: (NOTE) If result is NEGATIVE SARS-CoV-2 target nucleic acids are NOT DETECTED. The SARS-CoV-2 RNA is generally detectable in upper and lower  respiratory specimens during the acute phase of infection. The lowest  concentration of SARS-CoV-2 viral copies this assay can detect is 250  copies / mL. A negative result does not preclude SARS-CoV-2 infection  and should not be used as the sole basis for treatment or other  patient management decisions.  A negative result may occur with  improper specimen collection / handling, submission of specimen other  than nasopharyngeal swab, presence of viral mutation(s) within the  areas targeted by this assay, and inadequate number of viral copies  (<250 copies / mL). A negative result must be combined with clinical  observations, patient history, and epidemiological information. If result is POSITIVE SARS-CoV-2 target  nucleic acids are DETECTED. The SARS-CoV-2 RNA is generally detectable in upper and lower  respiratory specimens dur ing the acute phase of infection.  Positive  results are indicative of active infection with SARS-CoV-2.  Clinical  correlation with patient history and other diagnostic information is  necessary to determine patient infection status.  Positive results do  not rule out bacterial infection or co-infection with other viruses. If result is PRESUMPTIVE POSTIVE SARS-CoV-2 nucleic acids MAY BE PRESENT.   A presumptive positive result was obtained on the submitted specimen  and confirmed on repeat testing.  While 2019 novel coronavirus  (SARS-CoV-2) nucleic acids may be present in the submitted sample  additional confirmatory testing may be necessary for epidemiological  and / or clinical management purposes  to differentiate between  SARS-CoV-2 and other Sarbecovirus currently known to infect humans.  If clinically indicated additional testing with an alternate test  methodology [phone]) is advised. The SARS-CoV-2 RNA is generally  detectable in upper and lower respiratory sp ecimens during the acute  phase of infection. The expected result is Negative. Fact Sheet for Patients:  BoilerBrush.com.cy Fact Sheet for Healthcare Providers: https://pope.com/ This test is not yet approved or cleared by the [place] FDA and has been authorized for detection and/or diagnosis of SARS-CoV-2 by FDA under an Emergency Use Authorization (EUA).  This EUA will remain in effect (meaning this test can be used) for the duration of the COVID-19 declaration under Section 564(b)(1) of the Act, 21 U.S.C. section 360bbb-3(b)(1), unless the authorization is terminated or revoked sooner. Performed at [facility], [address], [place], [place] [address]   MRSA PCR Screening     Status: None   Collection Time: 05/03/19  5:59  PM  Result Value Ref Range Status   MRSA by PCR NEGATIVE NEGATIVE Final    Comment:        The GeneXpert MRSA Assay (FDA approved for NASAL specimens only), is one component of a comprehensive MRSA colonization surveillance program. It is not intended to diagnose MRSA infection nor to guide or monitor treatment for MRSA infections. Performed at [facility], [address] [place], [place] [address]      Labs: BNP (last 3 results) Recent Labs    11/12/18 0529 11/19/18 0819  BNP 15.0 10.2   Basic Metabolic Panel: Recent Labs  Lab 05/03/19 2049 05/04/19 0129 05/04/19 0725 05/04/19 1536 05/05/19 0232  NA 135 135 135 134* 125*  K 4.9 4.3 4.2 3.9 3.4*  CL 108 108 108 108 99  CO2 13* 19* 17* 19* 20*  GLUCOSE 190* 156* 183* 112* 101*  BUN 21* 19 16 15 12   CREATININE 1.50* 1.37* 1.25* 1.16 0.97  CALCIUM 8.8* 8.4* 8.5* 8.5* 7.8*   Liver Function Tests: Recent Labs  Lab 05/03/19 1320  AST 20  ALT 15  ALKPHOS 113  BILITOT 1.5*  PROT 5.8*  ALBUMIN 3.4*   No results for input(s): LIPASE, AMYLASE in the last 168 hours. No results for input(s): AMMONIA in the last 168 hours. CBC: Recent Labs  Lab 05/03/19 1320  WBC 19.0*  NEUTROABS 15.9*  HGB 11.4*  HCT 37.3*  MCV 94.7  PLT 296   Cardiac Enzymes: Recent Labs  Lab 05/03/19 1320  TROPONINI <0.03   BNP: Invalid input(s): POCBNP CBG: Recent Labs  Lab 05/04/19 1654 05/04/19 1728 05/04/19 1810 05/04/19 2202 05/05/19 0741  GLUCAP 41* 62* 87 91 114*   D-Dimer No results for input(s): DDIMER in the last 72 hours. Hgb A1c No results for input(s): HGBA1C in the last 72 hours. Lipid Profile No results for input(s): CHOL, HDL, LDLCALC, TRIG, CHOLHDL, LDLDIRECT in the last 72 hours. Thyroid function studies No results for input(s): TSH, T4TOTAL, T3FREE, THYROIDAB in the last 72 hours.  Invalid input(s): FREET3 Anemia work up No results for input(s): VITAMINB12, FOLATE, FERRITIN,  TIBC, IRON, RETICCTPCT in the last 72 hours. Urinalysis    Component Value Date/Time   COLORURINE COLORLESS (A) 05/03/2019 1216   APPEARANCEUR CLEAR 05/03/2019 1216   LABSPEC 1.018 05/03/2019 1216   PHURINE 5.0 05/03/2019 1216   GLUCOSEU >=500 (A) 05/03/2019 1216   HGBUR NEGATIVE 05/03/2019 1216   BILIRUBINUR NEGATIVE 05/03/2019 1216   BILIRUBINUR negative 03/11/2018 1613   KETONESUR 80 (A) 05/03/2019 1216   PROTEINUR NEGATIVE 05/03/2019 1216   UROBILINOGEN 0.2 03/11/2018 1613   UROBILINOGEN 0.2 06/14/2014 0809   NITRITE NEGATIVE 05/03/2019 1216   LEUKOCYTESUR NEGATIVE 05/03/2019 1216   Sepsis Labs Invalid input(s): PROCALCITONIN,  WBC,  LACTICIDVEN Microbiology Recent Results (from the past 240 hour(s))  SARS Coronavirus 2 (CEPHEID - [facility]), Hosp Order     Status: None   Collection Time: 05/03/19  3:45 PM  Result Value Ref Range Status   SARS Coronavirus 2 NEGATIVE NEGATIVE Final    Comment: (NOTE) If result is NEGATIVE SARS-CoV-2 target nucleic acids are NOT DETECTED. The SARS-CoV-2 RNA is generally detectable in upper and lower  respiratory specimens during the acute phase of infection. The lowest  concentration of SARS-CoV-2 viral copies this assay can detect is 250  copies / mL. A negative result does not preclude SARS-CoV-2 infection  and should not be used as the sole basis for treatment or other  patient management decisions.  A negative result may occur with  improper specimen collection / handling, submission of specimen other  than nasopharyngeal swab, presence of viral mutation(s) within the  areas targeted by this assay, and inadequate number of viral copies  (<250 copies / mL). A negative result must be combined with clinical  observations, patient history, and epidemiological information. If result is POSITIVE SARS-CoV-2 target nucleic acids are DETECTED. The SARS-CoV-2 RNA is generally detectable in upper and lower   respiratory specimens dur ing the acute phase of infection.  Positive  results are indicative of active infection with SARS-CoV-2.  Clinical  correlation with patient history and other diagnostic information is  necessary to determine patient infection status.  Positive results do  not rule out bacterial infection or co-infection with other viruses. If result is PRESUMPTIVE POSTIVE SARS-CoV-2 nucleic acids MAY BE PRESENT.  A presumptive positive result was obtained on the submitted specimen  and confirmed on repeat testing.  While 2019 novel coronavirus  (SARS-CoV-2) nucleic acids may be present in the submitted sample  additional confirmatory testing may be necessary for epidemiological  and / or clinical management purposes  to differentiate between  SARS-CoV-2 and other Sarbecovirus currently known to infect humans.  If clinically indicated additional testing with an alternate test  methodology [phone]) is advised. The SARS-CoV-2 RNA is generally  detectable in upper and lower respiratory sp ecimens during the acute  phase of infection. The expected result is Negative. Fact Sheet for Patients:  BoilerBrush.com.cy Fact Sheet for Healthcare Providers: https://pope.com/ This test is not yet approved or cleared by the [place] FDA and has been authorized for detection and/or diagnosis of SARS-CoV-2 by FDA under an Emergency Use Authorization (EUA).  This EUA will remain in effect (meaning this test can be used) for the duration of the COVID-19 declaration under Section 564(b)(1) of the Act, 21 U.S.C. section 360bbb-3(b)(1), unless the authorization is terminated or revoked sooner. Performed at [facility], [address] [place], [place] [address]   MRSA PCR Screening     Status: None   Collection Time: 05/03/19  5:59 PM  Result Value Ref Range Status   MRSA by PCR NEGATIVE NEGATIVE Final    Comment:         The GeneXpert MRSA Assay (FDA approved for NASAL specimens only), is one component of a comprehensive MRSA colonization surveillance program. It is not intended to diagnose MRSA infection nor to guide or monitor treatment for MRSA infections. Performed at [facility], [address] [place], [place] [address]      Time coordinating discharge: 45 minutes  SIGNED:   Coralie Keens, MD  [facility] 05/05/2019, 8:03 AM

## 2019-05-05 NOTE — Progress Notes (Signed)
Hypoglycemic Event  CBG: 61  Treatment:  4 oz juice and [place] sandwich   Symptoms: None  Follow-up CBG: Time:2126 CBG Result:55  Possible Reasons for Event: Low PO intake   Comments/MD notified: On call NP Blount paged and made aware.     Terie Purser

## 2019-05-05 NOTE — Progress Notes (Signed)
Hypoglycemic Event  CBG: 55  Treatment: glucose 4 gm chewable tablet: 1 tablet   Symptoms: None   Follow-up CBG: Time:2152 CBG Result:84  Possible Reasons for Event: low PO intake  Comments/MD notified:On call NP Blount paged and notified     Terie Purser

## 2019-05-06 LAB — GLUCOSE, CAPILLARY
Glucose-Capillary: 201 mg/dL — ABNORMAL HIGH (ref 70–99)
Glucose-Capillary: 278 mg/dL — ABNORMAL HIGH (ref 70–99)

## 2019-05-06 NOTE — Progress Notes (Signed)
Patient is feeling better, tolerating po well, no nausea or vomiting. Capillary glucose this am 201.   Will discharge patient home today.

## 2019-05-06 NOTE — TOC Transition Note (Signed)
Transition of Care [facility]) - CM/SW Discharge Note   Patient Details  Name: [name] [name] MRN: [id number] Date of Birth: 1956/06/01  [facility] CM/SW Contact:  Wende Neighbors, LCSW Phone Number: 05/06/2019, 3:03 PM   Clinical Narrative:   CSW received consult from RN to assist patient with a ride home. RN requested a cab voucher on behalf of patient. Address was verified with patient and cab voucher was given to RN          Patient Goals and CMS Choice        Discharge Placement                       Discharge Plan and Services                                     Social Determinants of Health (SDOH) Interventions     Readmission Risk Interventions No flowsheet data found.

## 2019-05-17 ENCOUNTER — Encounter (HOSPITAL_COMMUNITY): Payer: Self-pay

## 2019-05-17 ENCOUNTER — Other Ambulatory Visit: Payer: Self-pay

## 2019-05-17 ENCOUNTER — Emergency Department (HOSPITAL_COMMUNITY): Payer: Medicare HMO

## 2019-05-17 ENCOUNTER — Emergency Department (HOSPITAL_COMMUNITY)
Admission: EM | Admit: 2019-05-17 | Discharge: 2019-05-17 | Disposition: A | Discharge status: Home / Self Care | Payer: Medicare HMO | Attending: Emergency Medicine | Admitting: Emergency Medicine

## 2019-05-17 DIAGNOSIS — Z794 Long term (current) use of insulin: Secondary | ICD-10-CM | POA: Diagnosis not present

## 2019-05-17 DIAGNOSIS — I10 Essential (primary) hypertension: Secondary | ICD-10-CM | POA: Diagnosis not present

## 2019-05-17 DIAGNOSIS — R1084 Generalized abdominal pain: Secondary | ICD-10-CM | POA: Insufficient documentation

## 2019-05-17 DIAGNOSIS — R531 Weakness: Secondary | ICD-10-CM | POA: Diagnosis not present

## 2019-05-17 DIAGNOSIS — K449 Diaphragmatic hernia without obstruction or gangrene: Secondary | ICD-10-CM | POA: Diagnosis not present

## 2019-05-17 DIAGNOSIS — E1165 Type 2 diabetes mellitus with hyperglycemia: Secondary | ICD-10-CM | POA: Diagnosis not present

## 2019-05-17 DIAGNOSIS — Z7982 Long term (current) use of aspirin: Secondary | ICD-10-CM | POA: Diagnosis not present

## 2019-05-17 DIAGNOSIS — R112 Nausea with vomiting, unspecified: Secondary | ICD-10-CM

## 2019-05-17 DIAGNOSIS — K760 Fatty (change of) liver, not elsewhere classified: Secondary | ICD-10-CM | POA: Diagnosis not present

## 2019-05-17 DIAGNOSIS — I2510 Atherosclerotic heart disease of native coronary artery without angina pectoris: Secondary | ICD-10-CM | POA: Insufficient documentation

## 2019-05-17 DIAGNOSIS — R739 Hyperglycemia, unspecified: Secondary | ICD-10-CM

## 2019-05-17 DIAGNOSIS — Z79899 Other long term (current) drug therapy: Secondary | ICD-10-CM | POA: Insufficient documentation

## 2019-05-17 DIAGNOSIS — Z87891 Personal history of nicotine dependence: Secondary | ICD-10-CM | POA: Insufficient documentation

## 2019-05-17 LAB — COMPREHENSIVE METABOLIC PANEL
ALT: 51 U/L — ABNORMAL HIGH (ref 0–44)
AST: 27 U/L (ref 15–41)
Albumin: 4.7 g/dL (ref 3.5–5.0)
Alkaline Phosphatase: 189 U/L — ABNORMAL HIGH (ref 38–126)
Anion gap: 15 (ref 5–15)
BUN: 19 mg/dL (ref 6–20)
CO2: 20 mmol/L — ABNORMAL LOW (ref 22–32)
Calcium: 10.2 mg/dL (ref 8.9–10.3)
Chloride: 102 mmol/L (ref 98–111)
Creatinine, Ser: 1.1 mg/dL (ref 0.61–1.24)
GFR calc Af Amer: >60 mL/min (ref >60)
GFR calc non Af Amer: >60 mL/min (ref >60)
Glucose, Bld: 230 mg/dL — ABNORMAL HIGH (ref 70–99)
Potassium: 4.4 mmol/L (ref 3.5–5.1)
Sodium: 137 mmol/L (ref 135–145)
Total Bilirubin: 1 mg/dL (ref 0.3–1.2)
Total Protein: 8.3 g/dL — ABNORMAL HIGH (ref 6.5–8.1)

## 2019-05-17 LAB — URINALYSIS, ROUTINE W REFLEX MICROSCOPIC
Bacteria, UA: NONE SEEN
Bilirubin Urine: NEGATIVE
Glucose, UA: >=500 mg/dL — AB
Hgb urine dipstick: NEGATIVE
Ketones, ur: 80 mg/dL — AB
Leukocytes,Ua: NEGATIVE
Nitrite: NEGATIVE
Protein, ur: NEGATIVE mg/dL
Specific Gravity, Urine: 1.024 (ref 1.005–1.030)
pH: 5 (ref 5.0–8.0)

## 2019-05-17 LAB — BLOOD GAS, VENOUS
Acid-base deficit: 1.9 mmol/L (ref 0.0–2.0)
Bicarbonate: 22.8 mmol/L (ref 20.0–28.0)
O2 Saturation: 58.1 %
Patient temperature: 98.6
pCO2, Ven: 40.9 mmHg — ABNORMAL LOW (ref 44.0–60.0)
pH, Ven: 7.366 (ref 7.250–7.430)
pO2, Ven: 32.6 mmHg (ref 32.0–45.0)

## 2019-05-17 LAB — CBC
HCT: 43.7 % (ref 39.0–52.0)
Hemoglobin: 14.6 g/dL (ref 13.0–17.0)
MCH: 29 pg (ref 26.0–34.0)
MCHC: 33.4 g/dL (ref 30.0–36.0)
MCV: 86.7 fL (ref 80.0–100.0)
Platelets: 380 10*3/uL (ref 150–400)
RBC: 5.04 MIL/uL (ref 4.22–5.81)
RDW: 14.5 % (ref 11.5–15.5)
WBC: 12.9 10*3/uL — ABNORMAL HIGH (ref 4.0–10.5)
nRBC: 0 % (ref 0.0–0.2)

## 2019-05-17 LAB — CBG MONITORING, ED
Glucose-Capillary: 351 mg/dL — ABNORMAL HIGH (ref 70–99)
Glucose-Capillary: 305 mg/dL — ABNORMAL HIGH (ref 70–99)

## 2019-05-17 LAB — LIPASE, BLOOD: Lipase: 26 U/L (ref 11–51)

## 2019-05-17 MED ORDER — LACTATED RINGERS IV BOLUS
1000.0000 mL | Freq: Once | INTRAVENOUS | Status: AC
  Administered 2019-05-17: 1000 mL via INTRAVENOUS

## 2019-05-17 MED ORDER — ONDANSETRON 4 MG PO TBDP: 4.0000 mg | ORAL_TABLET | Freq: Three times a day (TID) | ORAL | 0 refills | Status: DC | PRN

## 2019-05-17 MED ORDER — SODIUM CHLORIDE 0.9% FLUSH: 3.0000 mL | Freq: Once | INTRAVENOUS | Status: DC

## 2019-05-17 MED ORDER — SODIUM CHLORIDE (PF) 0.9 % IJ SOLN
INTRAMUSCULAR | Status: AC
  Filled 2019-05-17: qty 50

## 2019-05-17 MED ORDER — ONDANSETRON HCL 4 MG/2ML IJ SOLN
4.0000 mg | Freq: Once | INTRAMUSCULAR | Status: AC
  Administered 2019-05-17: 4 mg via INTRAVENOUS
  Filled 2019-05-17: qty 2

## 2019-05-17 MED ORDER — PANTOPRAZOLE SODIUM 40 MG PO TBEC: 40.0000 mg | DELAYED_RELEASE_TABLET | Freq: Every day | ORAL | 0 refills | Status: DC

## 2019-05-17 MED ORDER — IOHEXOL 300 MG/ML  SOLN
100.0000 mL | Freq: Once | INTRAMUSCULAR | Status: AC | PRN
  Administered 2019-05-17: 100 mL via INTRAVENOUS

## 2019-05-17 NOTE — ED Notes (Signed)
Discharge paperwork reviewed with pt, pt verbalized understanding to f/u with GI doctor tomorrow, in addition to contacting his PCP to discuss blood work.  Pt ambulatory at discharge, NAD.

## 2019-05-17 NOTE — ED Triage Notes (Signed)
Patient c/o  N/V/D, abdominal pain, SOB, and body aches x 4 days. Patient was recently hospitalized for DKA.  Patient's CBG-351

## 2019-05-17 NOTE — ED Notes (Signed)
Bed: WA23 Expected date:  Expected time:  Means of arrival:  Comments: 

## 2019-05-17 NOTE — Discharge Instructions (Signed)
It was my pleasure taking care of you today!   Zofran as needed for nausea. I have refilled your protonix.   Call the GI doctor tomorrow to schedule a follow up appointment. Follow up with your primary care doctor as well to discuss your blood sugars.   Return to ER for fever, vomiting not controlled with nausea medication, new or worsening symptoms, any additional concerns.

## 2019-05-17 NOTE — ED Provider Notes (Signed)
[facility]-EMERGENCY DEPT Provider Note   CSN: [id number] Arrival date & time: 05/17/19  1059    History   Chief Complaint Chief Complaint  Patient presents with   Emesis   Diarrhea   Shortness of Breath   Generalized Body Aches   Abdominal Pain    HPI [name] [name] is a 63 y.o. male.     The history is provided by the patient and medical records. No language interpreter was used.  Emesis Associated symptoms: abdominal pain and diarrhea   Diarrhea Associated symptoms: abdominal pain and vomiting   Shortness of Breath Associated symptoms: abdominal pain and vomiting   Abdominal Pain Associated symptoms: diarrhea, nausea and vomiting    [name] [name] is a 63 y.o. male  with a PMH as listed below who presents to the Emergency Department complaining of generalized weakness, nausea, vomiting and diarrhea.  Patient states this is been gradually worsening over the last 4 days.  Feels similar to when he has had DKA in the past.  He was actually recently admitted for DKA on the third of this month.  Patient states that his CBGs have been running between 500 and "high" for the last 2 days.  He does note that he has been compliant with his insulin regimen, other than this morning because he came to the emergency department instead.  He states his meter read high last night, so he took an extra dose of his short acting insulin and his sugars came down to the high 400s at about 2 AM.  He has had a couple episodes of emesis over the last several days, but none today.  He is experiencing nausea.  He has had a couple loose stools as well.  He denies any chest pain.  Apparently reported shortness of breath to triage nurse, but denies any cough or shortness of breath to me.  Past Medical History:  Diagnosis Date   Barrett's esophagus    CAD (coronary artery disease)    a. NSTEMI 05/2014 - occluded RCA dominant proximal s/p asp-thrombectomy/DES to RCA, minimal LAD/LCx,  EF 50% by cath, 65-70% by echo.   Depression    Diabetes type 2, uncontrolled (HCC)    Former tobacco use    GERD (gastroesophageal reflux disease)    Hemorrhoids    hx of   Hypercholesterolemia    Hypertension    MI (myocardial infarction) (HCC)    Peripheral neuropathy     Patient Active Problem List   Diagnosis Date Noted   Cerebral thrombosis with cerebral infarction 01/15/2019   Subarachnoid hemorrhage 01/15/2019   Stroke-like symptoms 01/14/2019   Hypoglycemia due to insulin 01/14/2019   Noncompliance with medications    Homeless    DKA (diabetic ketoacidosis) (HCC) 11/27/2018   Chest pain of uncertain etiology 11/19/2018   Dizziness 11/19/2018   Bilateral low back pain without sciatica 04/12/2018   Vitamin D deficiency 04/07/2018   Pill dysphagia 04/07/2018   Enlarged thyroid gland 04/07/2018   Labile blood glucose 04/07/2018   Brittle diabetes mellitus (HCC) 04/07/2018   AKI (acute kidney injury) (HCC) 09/22/2017   Chronic pain syndrome 08/18/2016   Diabetic ketoacidosis (HCC) 04/12/2016   Hip pain, bilateral 08/23/2015   Chronic neck pain 08/23/2015   DKA (diabetic ketoacidoses) (HCC) 05/30/2015   Acute renal failure (HCC) 05/30/2015   Restless legs 08/31/2014   Uncontrolled diabetes mellitus type 2 with peripheral artery disease (HCC) 08/10/2014   Old myocardial infarction 08/10/2014   CAD (coronary  artery disease) 06/18/2014   Hyperlipidemia 06/18/2014   NSTEMI (non-ST elevated myocardial infarction) ([place]) 06/14/2014   N&V (nausea and vomiting) 07/21/2013   Chronic pain in left shoulder 04/06/2013   Peripheral neuropathy 02/28/2012   Depression 12/25/2011   GERD (gastroesophageal reflux disease) 12/25/2011   DM (diabetes mellitus), type 2, uncontrolled ([place]) 11/18/2011    Past Surgical History:  Procedure Laterality Date   LEFT HEART CATHETERIZATION WITH CORONARY ANGIOGRAM N/A 06/14/2014   Procedure: LEFT  HEART CATHETERIZATION WITH CORONARY ANGIOGRAM;  Surgeon: Jettie Booze, MD;  Location: [facility] CATH LAB;  Service: Cardiovascular;  Laterality: N/A;   TONSILLECTOMY          Home Medications    Prior to Admission medications   Medication Sig Start Date End Date Taking? Authorizing Provider  calcium carbonate (TUMS EX) 750 MG chewable tablet Chew 2 tablets by mouth daily.   Yes [provider]  EQ ASPIRIN ADULT LOW DOSE 81 MG EC tablet TAKE ONE TABLET BY MOUTH ONCE DAILY Patient taking differently: Take 81 mg by mouth daily.  07/02/15  Yes Jettie Booze, MD  gabapentin (NEURONTIN) 100 MG capsule TAKE 2 CAPSULES BY MOUTH EVERY MORNING AND 2 CAPSULES BY MOUTH  AT BEDTIME Patient taking differently: Take 200 mg by mouth 2 (two) times daily.  04/20/19  Yes Bayard Hugger, NP  HYDROcodone-acetaminophen (NORCO) 7.5-325 MG tablet Take 1 tablet by mouth every 6 (six) hours as needed for moderate pain. 04/20/19  Yes Bayard Hugger, NP  insulin aspart (NOVOLOG) 100 UNIT/ML injection Inject 0-15 Units into the skin 3 (three) times daily with meals for 30 days. For glucose 121 to 150 use 2 units, for 151 to 200 use 4 units, for 201-250 use 7 units, for 251 to 300 use 9 units, for 301 to 350 use 12 units, for 351 or greater use 15 units. 05/05/19 06/04/19 Yes Arrien, Jimmy Picket, MD  insulin detemir (LEVEMIR) 100 UNIT/ML injection Inject 0.3 mLs (30 Units total) into the skin 2 (two) times daily for 30 days. 05/05/19 06/04/19 Yes Arrien, Jimmy Picket, MD  lisinopril (PRINIVIL,ZESTRIL) 5 MG tablet Take 1 tablet (5 mg total) by mouth daily for 30 days. 02/26/19 05/17/19 Yes Elodia Florence., MD  metoprolol tartrate (LOPRESSOR) 25 MG tablet TAKE 1/2 TABLET(12.5 MG) BY MOUTH TWICE DAILY Patient taking differently: Take 12.5 mg by mouth 2 (two) times daily.  09/12/18  Yes Shawnee Knapp, MD  nitroGLYCERIN (NITROSTAT) 0.4 MG SL tablet Place 1 tablet (0.4 mg total) under the tongue every 5 (five)  minutes as needed for chest pain (up to 3 doses). 11/10/16  Yes Jettie Booze, MD  rosuvastatin (CRESTOR) 40 MG tablet TAKE 1 TABLET(40 MG) BY MOUTH DAILY Patient taking differently: Take 40 mg by mouth daily.  12/29/18  Yes Shawnee Knapp, MD  ondansetron (ZOFRAN ODT) 4 MG disintegrating tablet Take 1 tablet (4 mg total) by mouth every 8 (eight) hours as needed for nausea or vomiting. 05/17/19   Maliea Grandmaison, Ozella Almond, PA-C  pantoprazole (PROTONIX) 40 MG tablet Take 1 tablet (40 mg total) by mouth daily. 05/17/19   Amato Sevillano, Ozella Almond, PA-C    Family History Family History  Problem Relation Age of Onset   Hypertension Mother    Alzheimer's disease Mother    Alcohol abuse Father    Cancer Father 85       "Throat"   Diabetes type II Brother    Cancer Brother 34  throat cancer   Heart disease Neg Hx     Social History Social History   Tobacco Use   Smoking status: Former Smoker    Packs/day: 0.25    Years: 3.00    Pack years: 0.75    Quit date: 11/30/1998    Years since quitting: 20.4   Smokeless tobacco: Never Used  Substance Use Topics   Alcohol use: No    Alcohol/week: 1.0 standard drinks    Types: 1 Standard drinks or equivalent per week   Drug use: No     Allergies   Patient has no known allergies.   Review of Systems Review of Systems  Gastrointestinal: Positive for abdominal pain, diarrhea, nausea and vomiting.  All other systems reviewed and are negative.    Physical Exam Updated Vital Signs BP (!) 156/83    Pulse 83    Temp 97.9 F (36.6 C) (Oral)    Resp 18    Ht 5\' 9"  (1.753 m)    Wt 104.3 kg    SpO2 100%    BMI 33.97 kg/m   Physical Exam Vitals signs and nursing note reviewed.  Constitutional:      General: He is not in acute distress.    Appearance: He is well-developed.  HENT:     Head: Normocephalic and atraumatic.  Neck:     Musculoskeletal: Neck supple.  Cardiovascular:     Rate and Rhythm: Normal rate and regular rhythm.       Heart sounds: Normal heart sounds. No murmur.  Pulmonary:     Effort: Pulmonary effort is normal. No respiratory distress.     Breath sounds: Normal breath sounds.  Abdominal:     General: There is no distension.     Palpations: Abdomen is soft.     Comments: Generalized abdominal tenderness without focality.   Skin:    General: Skin is warm and dry.  Neurological:     Mental Status: He is alert and oriented to person, place, and time.      ED Treatments / Results  Labs (all labs ordered are listed, but only abnormal results are displayed) Labs Reviewed  COMPREHENSIVE METABOLIC PANEL - Abnormal; Notable for the following components:      Result Value   CO2 20 (*)    Glucose, Bld 230 (*)    Total Protein 8.3 (*)    ALT 51 (*)    Alkaline Phosphatase 189 (*)    All other components within normal limits  CBC - Abnormal; Notable for the following components:   WBC 12.9 (*)    All other components within normal limits  URINALYSIS, ROUTINE W REFLEX MICROSCOPIC - Abnormal; Notable for the following components:   Glucose, UA >=500 (*)    Ketones, ur 80 (*)    All other components within normal limits  BLOOD GAS, VENOUS - Abnormal; Notable for the following components:   pCO2, Ven 40.9 (*)    All other components within normal limits  CBG MONITORING, ED - Abnormal; Notable for the following components:   Glucose-Capillary 351 (*)    All other components within normal limits  CBG MONITORING, ED - Abnormal; Notable for the following components:   Glucose-Capillary 305 (*)    All other components within normal limits  LIPASE, BLOOD    EKG EKG Interpretation  Date/Time:  Wednesday May 17 2019 11:27:59 EDT Ventricular Rate:  89 PR Interval:    QRS Duration: 73 QT Interval:  355 QTC Calculation: 432 R  Axis:   30 Text Interpretation:  Sinus rhythm Ventricular premature complex Baseline wander Artifact When compared with ECG of 05/03/2019 No significant change was found  Confirmed by Samuel JesterMcManus, Kathleen [phone]([id number]) on 05/17/2019 11:54:49 AM   Radiology Ct Abdomen Pelvis W Contrast  Result Date: 05/17/2019 CLINICAL DATA:  Abdominal pain, shortness of breath and body aches for 4 days. EXAM: CT ABDOMEN AND PELVIS WITH CONTRAST TECHNIQUE: Multidetector CT imaging of the abdomen and pelvis was performed using the standard protocol following bolus administration of intravenous contrast. CONTRAST:  100 mL OMNIPAQUE IOHEXOL 300 MG/ML  SOLN COMPARISON:  CT abdomen and pelvis 02/24/2019. FINDINGS: Lower chest: Mild dependent atelectasis. Lung bases otherwise clear. Heart size normal. No pleural or pericardial effusion Hepatobiliary: No focal liver abnormality is seen. No gallstones, gallbladder wall thickening, or biliary dilatation. The liver is low attenuating relative to the spleen consistent with fatty infiltration. Pancreas: Unremarkable. No pancreatic ductal dilatation or surrounding inflammatory changes. Spleen: Normal in size without focal abnormality. Adrenals/Urinary Tract: Adrenal glands are unremarkable. Kidneys are normal, without renal calculi, focal lesion, or hydronephrosis. Bladder is unremarkable. Stomach/Bowel: Stomach is within normal limits. Appendix appears normal. No evidence of bowel wall thickening, distention, or inflammatory changes. Small hiatal hernia noted. Vascular/Lymphatic: Aortic atherosclerosis. No enlarged abdominal or pelvic lymph nodes. Reproductive: Prostate is unremarkable. Other: Chronic infiltration of superficial subcutaneous fat in the lateral aspects of the abdominal wall is unchanged and may represent injection sites. Musculoskeletal: No acute or focal abnormality. IMPRESSION: No acute abnormality abdomen or pelvis. No finding to explain the patient's symptoms. Fatty infiltration of the liver. Small hiatal hernia. Electronically Signed   By: Drusilla Kannerhomas  Dalessio M.D.   On: 05/17/2019 15:53   Dg Chest Portable 1 View  Result Date:  05/17/2019 CLINICAL DATA:  Weakness EXAM: PORTABLE CHEST 1 VIEW COMPARISON:  05/03/2019 FINDINGS: The heart size and mediastinal contours are within normal limits. Both lungs are clear. The visualized skeletal structures are unremarkable. IMPRESSION: No active disease. Electronically Signed   By: Marlan Palauharles  Clark M.D.   On: 05/17/2019 12:42    Procedures Procedures (including critical care time)  Medications Ordered in ED Medications  sodium chloride (PF) 0.9 % injection (0 mLs  Hold 05/17/19 1525)  lactated ringers bolus 1,000 mL (0 mLs Intravenous Stopped 05/17/19 1604)  ondansetron (ZOFRAN) injection 4 mg (4 mg Intravenous Given 05/17/19 1402)  iohexol (OMNIPAQUE) 300 MG/ML solution 100 mL (100 mLs Intravenous Contrast Given 05/17/19 1510)     Initial Impression / Assessment and Plan / ED Course  I have reviewed the triage vital signs and the nursing notes.  Pertinent labs & imaging results that were available during my care of the patient were reviewed by me and considered in my medical decision making (see chart for details).       [name] [name] is a 63 y.o. male who presents to ED for abdominal pain, nausea and vomiting.  History of diabetes blood sugars have been up as well.  On exam, patient is afebrile, hemodynamically stable with generalized abdominal tenderness.  Lungs clear.  Labs reviewed: Leukocytosis of 12.9.  CMP with hyperglycemia at 230, but normal anion gap.  CO2 of 20.  He is not acidotic with a pH of 7.36.  Chest x-ray clear and CT of the abdomen without acute findings.  He feels much improved after Zofran and fluid bolus in ED.  He does note that he has been out of his Protonix and feels as if this could be contributing.  He is tolerating p.o. Stressed importance of hydration, close glycemic monitoring/control, close PCP follow-up and low threshold with return. All questions answered.   Patient discussed with Dr. Rubin PayorPickering who agrees with treatment plan.    Final Clinical  Impressions(s) / ED Diagnoses   Final diagnoses:  Hyperglycemia  Non-intractable vomiting with nausea, unspecified vomiting type    ED Discharge Orders         Ordered    pantoprazole (PROTONIX) 40 MG tablet  Daily     05/17/19 1617    ondansetron (ZOFRAN ODT) 4 MG disintegrating tablet  Every 8 hours PRN     05/17/19 1617           Dontae Minerva, Chase PicketJaime Pilcher, PA-C 05/17/19 1650    Benjiman CorePickering, Nathan, MD 05/17/19 2154

## 2019-05-17 NOTE — ED Notes (Signed)
Attempted IV start x2, unsuccessful.  [place] IV placement requested.

## 2019-05-17 NOTE — ED Notes (Signed)
Attempted IV in right hand w/o success.  Pt states that he often requires [place] for IV placement.

## 2019-05-17 NOTE — ED Notes (Signed)
Pt with no emesis occurrences after eating.  Stating that he feels slightly nauseas now, but feel comfortable with being d/c with nausea medications for home.

## 2019-05-17 NOTE — ED Notes (Signed)
Pt provided diet ginger ale and [place] sandwich, per his request.  Will continue to monitor.

## 2019-05-17 NOTE — ED Notes (Signed)
Patient transported to CT 

## 2019-05-24 DIAGNOSIS — R69 Illness, unspecified: Secondary | ICD-10-CM | POA: Diagnosis not present

## 2019-06-15 ENCOUNTER — Inpatient Hospital Stay (HOSPITAL_COMMUNITY)
Admission: AD | Admit: 2019-06-15 | Discharge: 2019-06-21 | DRG: 885 | Disposition: A | Discharge status: Home / Self Care | Payer: Medicare HMO | Attending: Psychiatry | Admitting: Psychiatry

## 2019-06-15 ENCOUNTER — Encounter (HOSPITAL_COMMUNITY): Payer: Self-pay

## 2019-06-15 ENCOUNTER — Other Ambulatory Visit: Payer: Self-pay

## 2019-06-15 DIAGNOSIS — R45851 Suicidal ideations: Secondary | ICD-10-CM | POA: Diagnosis present

## 2019-06-15 DIAGNOSIS — G894 Chronic pain syndrome: Secondary | ICD-10-CM | POA: Diagnosis not present

## 2019-06-15 DIAGNOSIS — K219 Gastro-esophageal reflux disease without esophagitis: Secondary | ICD-10-CM | POA: Diagnosis present

## 2019-06-15 DIAGNOSIS — Z87891 Personal history of nicotine dependence: Secondary | ICD-10-CM

## 2019-06-15 DIAGNOSIS — Z62811 Personal history of psychological abuse in childhood: Secondary | ICD-10-CM | POA: Diagnosis not present

## 2019-06-15 DIAGNOSIS — E1142 Type 2 diabetes mellitus with diabetic polyneuropathy: Secondary | ICD-10-CM | POA: Diagnosis not present

## 2019-06-15 DIAGNOSIS — G63 Polyneuropathy in diseases classified elsewhere: Secondary | ICD-10-CM | POA: Diagnosis not present

## 2019-06-15 DIAGNOSIS — E1165 Type 2 diabetes mellitus with hyperglycemia: Secondary | ICD-10-CM | POA: Diagnosis not present

## 2019-06-15 DIAGNOSIS — E162 Hypoglycemia, unspecified: Secondary | ICD-10-CM | POA: Diagnosis not present

## 2019-06-15 DIAGNOSIS — T50902A Poisoning by unspecified drugs, medicaments and biological substances, intentional self-harm, initial encounter: Secondary | ICD-10-CM | POA: Diagnosis not present

## 2019-06-15 DIAGNOSIS — G8929 Other chronic pain: Secondary | ICD-10-CM | POA: Diagnosis not present

## 2019-06-15 DIAGNOSIS — Z5181 Encounter for therapeutic drug level monitoring: Secondary | ICD-10-CM | POA: Diagnosis not present

## 2019-06-15 DIAGNOSIS — Z590 Homelessness: Secondary | ICD-10-CM | POA: Diagnosis not present

## 2019-06-15 DIAGNOSIS — I10 Essential (primary) hypertension: Secondary | ICD-10-CM | POA: Diagnosis present

## 2019-06-15 DIAGNOSIS — G4700 Insomnia, unspecified: Secondary | ICD-10-CM | POA: Diagnosis present

## 2019-06-15 DIAGNOSIS — Z1159 Encounter for screening for other viral diseases: Secondary | ICD-10-CM

## 2019-06-15 DIAGNOSIS — Z62810 Personal history of physical and sexual abuse in childhood: Secondary | ICD-10-CM | POA: Diagnosis not present

## 2019-06-15 DIAGNOSIS — F332 Major depressive disorder, recurrent severe without psychotic features: Secondary | ICD-10-CM | POA: Diagnosis not present

## 2019-06-15 DIAGNOSIS — F32A Depression, unspecified: Secondary | ICD-10-CM | POA: Diagnosis present

## 2019-06-15 DIAGNOSIS — Z915 Personal history of self-harm: Secondary | ICD-10-CM | POA: Diagnosis not present

## 2019-06-15 DIAGNOSIS — E782 Mixed hyperlipidemia: Secondary | ICD-10-CM | POA: Diagnosis not present

## 2019-06-15 DIAGNOSIS — F329 Major depressive disorder, single episode, unspecified: Secondary | ICD-10-CM | POA: Diagnosis present

## 2019-06-15 DIAGNOSIS — E160 Drug-induced hypoglycemia without coma: Secondary | ICD-10-CM | POA: Diagnosis not present

## 2019-06-15 DIAGNOSIS — T383X5A Adverse effect of insulin and oral hypoglycemic [antidiabetic] drugs, initial encounter: Secondary | ICD-10-CM | POA: Diagnosis not present

## 2019-06-15 DIAGNOSIS — R69 Illness, unspecified: Secondary | ICD-10-CM | POA: Diagnosis not present

## 2019-06-15 DIAGNOSIS — T50992A Poisoning by other drugs, medicaments and biological substances, intentional self-harm, initial encounter: Secondary | ICD-10-CM | POA: Diagnosis not present

## 2019-06-15 DIAGNOSIS — Z20828 Contact with and (suspected) exposure to other viral communicable diseases: Secondary | ICD-10-CM | POA: Diagnosis not present

## 2019-06-15 DIAGNOSIS — IMO0002 Reserved for concepts with insufficient information to code with codable children: Secondary | ICD-10-CM | POA: Diagnosis present

## 2019-06-15 DIAGNOSIS — Z79891 Long term (current) use of opiate analgesic: Secondary | ICD-10-CM | POA: Diagnosis not present

## 2019-06-15 DIAGNOSIS — I2510 Atherosclerotic heart disease of native coronary artery without angina pectoris: Secondary | ICD-10-CM | POA: Diagnosis not present

## 2019-06-15 DIAGNOSIS — I252 Old myocardial infarction: Secondary | ICD-10-CM | POA: Diagnosis not present

## 2019-06-15 DIAGNOSIS — E11649 Type 2 diabetes mellitus with hypoglycemia without coma: Secondary | ICD-10-CM | POA: Diagnosis not present

## 2019-06-15 DIAGNOSIS — Z03818 Encounter for observation for suspected exposure to other biological agents ruled out: Secondary | ICD-10-CM | POA: Diagnosis not present

## 2019-06-15 DIAGNOSIS — F411 Generalized anxiety disorder: Secondary | ICD-10-CM | POA: Diagnosis not present

## 2019-06-15 LAB — GLUCOSE, CAPILLARY: Glucose-Capillary: 188 mg/dL — ABNORMAL HIGH (ref 70–99)

## 2019-06-15 LAB — SARS CORONAVIRUS 2 BY RT PCR (HOSPITAL ORDER, PERFORMED IN ~~LOC~~ HOSPITAL LAB): SARS Coronavirus 2: NEGATIVE

## 2019-06-15 MED ORDER — ALUM & MAG HYDROXIDE-SIMETH 200-200-20 MG/5ML PO SUSP: 30.0000 mL | ORAL | Status: DC | PRN

## 2019-06-15 MED ORDER — GABAPENTIN 100 MG PO CAPS
200.0000 mg | ORAL_CAPSULE | Freq: Two times a day (BID) | ORAL | Status: DC
  Administered 2019-06-15 – 2019-06-16 (×2): 200 mg via ORAL
  Filled 2019-06-15 (×5): qty 2

## 2019-06-15 MED ORDER — HYDROXYZINE HCL 25 MG PO TABS
25.0000 mg | ORAL_TABLET | Freq: Three times a day (TID) | ORAL | Status: DC | PRN
  Administered 2019-06-15: 25 mg via ORAL
  Filled 2019-06-15: qty 1

## 2019-06-15 MED ORDER — LISINOPRIL 5 MG PO TABS
5.0000 mg | ORAL_TABLET | Freq: Every day | ORAL | Status: DC
  Administered 2019-06-15 – 2019-06-19 (×5): 5 mg via ORAL
  Filled 2019-06-15 (×7): qty 1

## 2019-06-15 MED ORDER — ASPIRIN 81 MG PO TBEC
81.0000 mg | DELAYED_RELEASE_TABLET | Freq: Every day | ORAL | Status: DC
  Administered 2019-06-15 – 2019-06-21 (×7): 81 mg via ORAL
  Filled 2019-06-15 (×9): qty 1

## 2019-06-15 MED ORDER — ACETAMINOPHEN 325 MG PO TABS
650.0000 mg | ORAL_TABLET | Freq: Four times a day (QID) | ORAL | Status: DC | PRN
  Administered 2019-06-15 – 2019-06-16 (×2): 650 mg via ORAL
  Filled 2019-06-15 (×3): qty 2

## 2019-06-15 MED ORDER — INSULIN ASPART 100 UNIT/ML ~~LOC~~ SOLN
0.0000 [IU] | Freq: Three times a day (TID) | SUBCUTANEOUS | Status: DC
  Administered 2019-06-16: 20 [IU] via SUBCUTANEOUS

## 2019-06-15 MED ORDER — INSULIN DETEMIR 100 UNIT/ML ~~LOC~~ SOLN
30.0000 [IU] | Freq: Two times a day (BID) | SUBCUTANEOUS | Status: DC
  Administered 2019-06-15 – 2019-06-19 (×8): 30 [IU] via SUBCUTANEOUS

## 2019-06-15 MED ORDER — METOPROLOL TARTRATE 12.5 MG HALF TABLET
12.5000 mg | ORAL_TABLET | Freq: Two times a day (BID) | ORAL | Status: DC
  Administered 2019-06-15 – 2019-06-19 (×8): 12.5 mg via ORAL
  Filled 2019-06-15 (×11): qty 1

## 2019-06-15 MED ORDER — MAGNESIUM HYDROXIDE 400 MG/5ML PO SUSP: 30.0000 mL | Freq: Every day | ORAL | Status: DC | PRN

## 2019-06-15 MED ORDER — ROSUVASTATIN CALCIUM 20 MG PO TABS
40.0000 mg | ORAL_TABLET | Freq: Every day | ORAL | Status: DC
  Administered 2019-06-15 – 2019-06-21 (×7): 40 mg via ORAL
  Filled 2019-06-15 (×2): qty 2
  Filled 2019-06-15 (×2): qty 1
  Filled 2019-06-15 (×2): qty 2
  Filled 2019-06-15 (×2): qty 1
  Filled 2019-06-15: qty 2
  Filled 2019-06-15: qty 1

## 2019-06-15 MED ORDER — TRAZODONE HCL 50 MG PO TABS: 50.0000 mg | ORAL_TABLET | Freq: Every evening | ORAL | Status: DC | PRN

## 2019-06-15 MED ORDER — NITROGLYCERIN 0.4 MG SL SUBL: 0.4000 mg | SUBLINGUAL_TABLET | SUBLINGUAL | Status: DC | PRN
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
## 2019-06-15 NOTE — Tx Team (Signed)
Initial Treatment Plan 06/15/2019 11:11 PM [name] HTD:[id number]    PATIENT STRESSORS: Health problems Marital or family conflict   PATIENT STRENGTHS: Ability for insight Average or above average intelligence Capable of independent living Communication skills General fund of knowledge Motivation for treatment/growth Special hobby/interest   PATIENT IDENTIFIED PROBLEMS: "At risk for suicide"   "Depression"                    DISCHARGE CRITERIA:  Ability to meet basic life and health needs Improved stabilization in mood, thinking, and/or behavior Verbal commitment to aftercare and medication compliance  PRELIMINARY DISCHARGE PLAN: Attend PHP/IOP Outpatient therapy Return to previous living arrangement  PATIENT/FAMILY INVOLVEMENT: This treatment plan has been presented to and reviewed with the patient, [name] [name].The patient have been given the opportunity to ask questions and make suggestions.  Lonia Skinner, RN 06/15/2019, 11:11 PM

## 2019-06-15 NOTE — Progress Notes (Signed)
[place] NOVEL CORONAVIRUS (COVID-19) DAILY CHECK-OFF SYMPTOMS - answer yes or no to each - every day NO YES  Have you had a fever in the past 24 hours?  . Fever (Temp > 37.80C / 100F) X   Have you had any of these symptoms in the past 24 hours? . New Cough .  Sore Throat  .  Shortness of Breath .  Difficulty Breathing .  Unexplained Body Aches   X   Have you had any one of these symptoms in the past 24 hours not related to allergies?   . Runny Nose .  Nasal Congestion .  Sneezing   X   If you have had runny nose, nasal congestion, sneezing in the past 24 hours, has it worsened?  X   EXPOSURES - check yes or no X   Have you traveled outside the state in the past 14 days?  X   Have you been in contact with someone with a confirmed diagnosis of COVID-19 or PUI in the past 14 days without wearing appropriate PPE?  X   Have you been living in the same home as a person with confirmed diagnosis of COVID-19 or a PUI (household contact)?    X   Have you been diagnosed with COVID-19?    X              What to do next: Answered NO to all: Answered YES to anything:   Proceed with unit schedule Follow the [facility] Inpatient Flowsheet.   
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
## 2019-06-15 NOTE — Progress Notes (Addendum)
UA cup given. Pt made aware. Pending sample.  

## 2019-06-16 DIAGNOSIS — Z62810 Personal history of physical and sexual abuse in childhood: Secondary | ICD-10-CM

## 2019-06-16 DIAGNOSIS — Z915 Personal history of self-harm: Secondary | ICD-10-CM

## 2019-06-16 DIAGNOSIS — F332 Major depressive disorder, recurrent severe without psychotic features: Principal | ICD-10-CM

## 2019-06-16 DIAGNOSIS — E1165 Type 2 diabetes mellitus with hyperglycemia: Secondary | ICD-10-CM

## 2019-06-16 DIAGNOSIS — Z62811 Personal history of psychological abuse in childhood: Secondary | ICD-10-CM

## 2019-06-16 LAB — COMPREHENSIVE METABOLIC PANEL
ALT: 40 U/L (ref 0–44)
AST: 25 U/L (ref 15–41)
Albumin: 4 g/dL (ref 3.5–5.0)
Alkaline Phosphatase: 141 U/L — ABNORMAL HIGH (ref 38–126)
Anion gap: 12 (ref 5–15)
BUN: 17 mg/dL (ref 8–23)
CO2: 21 mmol/L — ABNORMAL LOW (ref 22–32)
Calcium: 9.1 mg/dL (ref 8.9–10.3)
Chloride: 105 mmol/L (ref 98–111)
Creatinine, Ser: 0.95 mg/dL (ref 0.61–1.24)
GFR calc Af Amer: >60 mL/min (ref >60)
GFR calc non Af Amer: >60 mL/min (ref >60)
Glucose, Bld: 305 mg/dL — ABNORMAL HIGH (ref 70–99)
Potassium: 3.7 mmol/L (ref 3.5–5.1)
Sodium: 138 mmol/L (ref 135–145)
Total Bilirubin: 0.4 mg/dL (ref 0.3–1.2)
Total Protein: 7.2 g/dL (ref 6.5–8.1)

## 2019-06-16 LAB — RAPID URINE DRUG SCREEN, HOSP PERFORMED
Amphetamines: NOT DETECTED
Barbiturates: NOT DETECTED
Benzodiazepines: NOT DETECTED
Cocaine: NOT DETECTED
Opiates: POSITIVE — AB
Tetrahydrocannabinol: POSITIVE — AB

## 2019-06-16 LAB — CBC
HCT: 44 % (ref 39.0–52.0)
Hemoglobin: 13.8 g/dL (ref 13.0–17.0)
MCH: 28 pg (ref 26.0–34.0)
MCHC: 31.4 g/dL (ref 30.0–36.0)
MCV: 89.2 fL (ref 80.0–100.0)
Platelets: 348 10*3/uL (ref 150–400)
RBC: 4.93 MIL/uL (ref 4.22–5.81)
RDW: 14.1 % (ref 11.5–15.5)
WBC: 5.1 10*3/uL (ref 4.0–10.5)
nRBC: 0 % (ref 0.0–0.2)

## 2019-06-16 LAB — TSH: TSH: 2.264 u[IU]/mL (ref 0.350–4.500)

## 2019-06-16 LAB — GLUCOSE, CAPILLARY
Glucose-Capillary: 364 mg/dL — ABNORMAL HIGH (ref 70–99)
Glucose-Capillary: 209 mg/dL — ABNORMAL HIGH (ref 70–99)
Glucose-Capillary: 308 mg/dL — ABNORMAL HIGH (ref 70–99)
Glucose-Capillary: 309 mg/dL — ABNORMAL HIGH (ref 70–99)

## 2019-06-16 LAB — LIPID PANEL
Cholesterol: 263 mg/dL — ABNORMAL HIGH (ref 0–200)
HDL: 37 mg/dL — ABNORMAL LOW (ref >40)
LDL Cholesterol: 151 mg/dL — ABNORMAL HIGH (ref 0–99)
Total CHOL/HDL Ratio: 7.1 RATIO
Triglycerides: 376 mg/dL — ABNORMAL HIGH (ref <150)
VLDL: 75 mg/dL — ABNORMAL HIGH (ref 0–40)

## 2019-06-16 LAB — HEMOGLOBIN A1C
Hgb A1c MFr Bld: 9.6 % — ABNORMAL HIGH (ref 4.8–5.6)
Mean Plasma Glucose: 228.82 mg/dL

## 2019-06-16 LAB — ETHANOL: Alcohol, Ethyl (B): <10 mg/dL (ref <10)

## 2019-06-16 MED ORDER — MIRTAZAPINE 30 MG PO TABS
30.0000 mg | ORAL_TABLET | Freq: Every day | ORAL | Status: DC
  Administered 2019-06-16 – 2019-06-17 (×2): 30 mg via ORAL
  Filled 2019-06-16 (×5): qty 1

## 2019-06-16 MED ORDER — HYDROCODONE-ACETAMINOPHEN 10-325 MG PO TABS
1.0000 | ORAL_TABLET | Freq: Four times a day (QID) | ORAL | Status: DC | PRN
  Administered 2019-06-16 – 2019-06-21 (×12): 1 via ORAL
  Filled 2019-06-16 (×12): qty 1

## 2019-06-16 MED ORDER — GABAPENTIN 100 MG PO CAPS
200.0000 mg | ORAL_CAPSULE | Freq: Two times a day (BID) | ORAL | Status: DC
  Administered 2019-06-16 – 2019-06-21 (×10): 200 mg via ORAL
  Filled 2019-06-16 (×12): qty 2

## 2019-06-16 MED ORDER — SERTRALINE HCL 25 MG PO TABS
25.0000 mg | ORAL_TABLET | Freq: Every day | ORAL | Status: DC
  Administered 2019-06-16 – 2019-06-17 (×2): 25 mg via ORAL
  Filled 2019-06-16 (×3): qty 1

## 2019-06-16 MED ORDER — PANTOPRAZOLE SODIUM 40 MG PO TBEC
40.0000 mg | DELAYED_RELEASE_TABLET | Freq: Every day | ORAL | Status: DC
  Administered 2019-06-16 – 2019-06-21 (×6): 40 mg via ORAL
  Filled 2019-06-16 (×7): qty 1

## 2019-06-16 MED ORDER — BUPROPION HCL ER (XL) 150 MG PO TB24
150.0000 mg | ORAL_TABLET | Freq: Every day | ORAL | Status: DC
  Administered 2019-06-16 – 2019-06-21 (×5): 150 mg via ORAL
  Filled 2019-06-16 (×9): qty 1

## 2019-06-16 MED ORDER — GABAPENTIN 400 MG PO CAPS: 400.0000 mg | ORAL_CAPSULE | Freq: Two times a day (BID) | ORAL | Status: DC

## 2019-06-16 MED ORDER — HYDROCODONE-ACETAMINOPHEN 7.5-325 MG PO TABS: 1.0000 | ORAL_TABLET | Freq: Four times a day (QID) | ORAL | Status: DC | PRN

## 2019-06-16 MED ORDER — INSULIN ASPART 100 UNIT/ML ~~LOC~~ SOLN
0.0000 [IU] | Freq: Every day | SUBCUTANEOUS | Status: DC
  Administered 2019-06-16 – 2019-06-18 (×3): 4 [IU] via SUBCUTANEOUS
  Administered 2019-06-19: 5 [IU] via SUBCUTANEOUS
  Administered 2019-06-20: 3 [IU] via SUBCUTANEOUS

## 2019-06-16 MED ORDER — CALCIUM CARBONATE ANTACID 500 MG PO CHEW
2.0000 | CHEWABLE_TABLET | Freq: Every day | ORAL | Status: DC
  Administered 2019-06-16 – 2019-06-17 (×2): 400 mg via ORAL
  Filled 2019-06-16 (×8): qty 2

## 2019-06-16 MED ORDER — ONDANSETRON HCL 4 MG PO TABS: 4.0000 mg | ORAL_TABLET | Freq: Three times a day (TID) | ORAL | Status: DC | PRN

## 2019-06-16 MED ORDER — INSULIN ASPART 100 UNIT/ML ~~LOC~~ SOLN
0.0000 [IU] | Freq: Three times a day (TID) | SUBCUTANEOUS | Status: DC
  Administered 2019-06-16: 3 [IU] via SUBCUTANEOUS
  Administered 2019-06-16: 7 [IU] via SUBCUTANEOUS
  Administered 2019-06-17: 3 [IU] via SUBCUTANEOUS
  Administered 2019-06-17: 9 [IU] via SUBCUTANEOUS
  Administered 2019-06-18: 7 [IU] via SUBCUTANEOUS
  Administered 2019-06-18: 1 [IU] via SUBCUTANEOUS
  Administered 2019-06-18: 3 [IU] via SUBCUTANEOUS
  Administered 2019-06-19: 5 [IU] via SUBCUTANEOUS
  Administered 2019-06-19: 7 [IU] via SUBCUTANEOUS
  Administered 2019-06-19: 9 [IU] via SUBCUTANEOUS
  Administered 2019-06-20: 5 [IU] via SUBCUTANEOUS
  Administered 2019-06-20: 1 [IU] via SUBCUTANEOUS
  Administered 2019-06-20: 9 [IU] via SUBCUTANEOUS
  Administered 2019-06-21: 1 [IU] via SUBCUTANEOUS
  Filled 2019-06-16: qty 10
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
## 2019-06-16 NOTE — Progress Notes (Signed)
UA collected. Pending results.  

## 2019-06-16 NOTE — Tx Team (Signed)
Interdisciplinary Treatment and Diagnostic Plan Update  06/16/2019 Time of Session:  CORDALE MANERA MRN: [id number]  Principal Diagnosis: Depression  Secondary Diagnoses: Principal Problem:   Depression Active Problems:   DM (diabetes mellitus), type 2, uncontrolled ([place])   MDD (major depressive disorder), recurrent severe, without psychosis ([place])   Current Medications:  Current Facility-Administered Medications  Medication Dose Route Frequency Provider Last Rate Last Dose  . acetaminophen (TYLENOL) tablet 650 mg  650 mg Oral Q6H PRN Ethelene Hal, NP   650 mg at 06/16/19 0618  . alum & mag hydroxide-simeth (MAALOX/MYLANTA) 200-200-20 MG/5ML suspension 30 mL  30 mL Oral Q4H PRN Ethelene Hal, NP      . aspirin EC tablet 81 mg  81 mg Oral Daily Ethelene Hal, NP   81 mg at 06/16/19 0759  . buPROPion (WELLBUTRIN XL) 24 hr tablet 150 mg  150 mg Oral Daily Sharma Covert, MD      . calcium carbonate (TUMS - dosed in mg elemental calcium) chewable tablet 400 mg of elemental calcium  2 tablet Oral Daily Sharma Covert, MD      . gabapentin (NEURONTIN) capsule 200 mg  200 mg Oral BID Sharma Covert, MD      . HYDROcodone-acetaminophen [facility]) 10-325 MG per tablet 1 tablet  1 tablet Oral Q6H PRN Sharma Covert, MD      . hydrOXYzine (ATARAX/VISTARIL) tablet 25 mg  25 mg Oral TID PRN Ethelene Hal, NP   25 mg at 06/15/19 2120  . insulin aspart (novoLOG) injection 0-20 Units  0-20 Units Subcutaneous TID WC Ethelene Hal, NP   20 Units at 06/16/19 0617  . insulin detemir (LEVEMIR) injection 30 Units  30 Units Subcutaneous BID Ethelene Hal, NP   30 Units at 06/16/19 0757  . lisinopril (ZESTRIL) tablet 5 mg  5 mg Oral Daily Ethelene Hal, NP   5 mg at 06/16/19 0759  . magnesium hydroxide (MILK OF MAGNESIA) suspension 30 mL  30 mL Oral Daily PRN Ethelene Hal, NP      . metoprolol tartrate (LOPRESSOR) tablet 12.5 mg  12.5  mg Oral BID Ethelene Hal, NP   12.5 mg at 06/16/19 0759  . nitroGLYCERIN (NITROSTAT) SL tablet 0.4 mg  0.4 mg Sublingual Q5 min PRN Ethelene Hal, NP      . ondansetron [facility]) tablet 4 mg  4 mg Oral Q8H PRN Sharma Covert, MD      . pantoprazole (PROTONIX) EC tablet 40 mg  40 mg Oral Daily Sharma Covert, MD      . rosuvastatin (CRESTOR) tablet 40 mg  40 mg Oral Daily Ethelene Hal, NP   40 mg at 06/16/19 0759  . sertraline (ZOLOFT) tablet 25 mg  25 mg Oral Daily Sharma Covert, MD      . traZODone (DESYREL) tablet 50 mg  50 mg Oral QHS PRN Ethelene Hal, NP       PTA Medications: Medications Prior to Admission  Medication Sig Dispense Refill Last Dose  . calcium carbonate (TUMS EX) 750 MG chewable tablet Chew 2 tablets by mouth daily.     . EQ ASPIRIN ADULT LOW DOSE 81 MG EC tablet TAKE ONE TABLET BY MOUTH ONCE DAILY (Patient taking differently: Take 81 mg by mouth daily. ) 90 tablet 0   . gabapentin (NEURONTIN) 100 MG capsule TAKE 2 CAPSULES BY MOUTH EVERY MORNING AND 2 CAPSULES BY MOUTH  AT BEDTIME (Patient taking differently: Take 200 mg by mouth 2 (two) times daily. ) 360 capsule 3   . HYDROcodone-acetaminophen (NORCO) 7.5-325 MG tablet Take 1 tablet by mouth every 6 (six) hours as needed for moderate pain. 120 tablet 0   . insulin aspart (NOVOLOG) 100 UNIT/ML injection Inject 0-15 Units into the skin 3 (three) times daily with meals for 30 days. For glucose 121 to 150 use 2 units, for 151 to 200 use 4 units, for 201-250 use 7 units, for 251 to 300 use 9 units, for 301 to 350 use 12 units, for 351 or greater use 15 units. 13.5 mL 0   . insulin detemir (LEVEMIR) 100 UNIT/ML injection Inject 0.3 mLs (30 Units total) into the skin 2 (two) times daily for 30 days. 18 mL 0   . lisinopril (PRINIVIL,ZESTRIL) 5 MG tablet Take 1 tablet (5 mg total) by mouth daily for 30 days. 30 tablet 0   . metoprolol tartrate (LOPRESSOR) 25 MG tablet TAKE 1/2 TABLET(12.5  MG) BY MOUTH TWICE DAILY (Patient taking differently: Take 12.5 mg by mouth 2 (two) times daily. ) 90 tablet 3   . nitroGLYCERIN (NITROSTAT) 0.4 MG SL tablet Place 1 tablet (0.4 mg total) under the tongue every 5 (five) minutes as needed for chest pain (up to 3 doses). 25 tablet 0   . ondansetron (ZOFRAN ODT) 4 MG disintegrating tablet Take 1 tablet (4 mg total) by mouth every 8 (eight) hours as needed for nausea or vomiting. 20 tablet 0   . pantoprazole (PROTONIX) 40 MG tablet Take 1 tablet (40 mg total) by mouth daily. 90 tablet 0   . rosuvastatin (CRESTOR) 40 MG tablet TAKE 1 TABLET(40 MG) BY MOUTH DAILY (Patient taking differently: Take 40 mg by mouth daily. ) 90 tablet 0     Patient Stressors: Health problems Marital or family conflict  Patient Strengths: Ability for insight Average or above average intelligence Capable of independent living Curator fund of knowledge Motivation for treatment/growth Special hobby/interest  Treatment Modalities: Medication Management, Group therapy, Case management,  1 to 1 session with clinician, Psychoeducation, Recreational therapy.   Physician Treatment Plan for Primary Diagnosis: Depression Long Term Goal(s):     Short Term Goals:    Medication Management: Evaluate patient's response, side effects, and tolerance of medication regimen.  Therapeutic Interventions: 1 to 1 sessions, Unit Group sessions and Medication administration.  Evaluation of Outcomes: Not Met  Physician Treatment Plan for Secondary Diagnosis: Principal Problem:   Depression Active Problems:   DM (diabetes mellitus), type 2, uncontrolled ([place])   MDD (major depressive disorder), recurrent severe, without psychosis ([place])  Long Term Goal(s):     Short Term Goals:       Medication Management: Evaluate patient's response, side effects, and tolerance of medication regimen.  Therapeutic Interventions: 1 to 1 sessions, Unit Group sessions and  Medication administration.  Evaluation of Outcomes: Not Met   RN Treatment Plan for Primary Diagnosis: Depression Long Term Goal(s): Knowledge of disease and therapeutic regimen to maintain health will improve  Short Term Goals: Ability to participate in decision making will improve, Ability to verbalize feelings will improve, Ability to disclose and discuss suicidal ideas and Ability to identify and develop effective coping behaviors will improve  Medication Management: RN will administer medications as ordered by provider, will assess and evaluate patient's response and provide education to patient for prescribed medication. RN will report any adverse and/or side effects to prescribing provider.  Therapeutic Interventions:  1 on 1 counseling sessions, Psychoeducation, Medication administration, Evaluate responses to treatment, Monitor vital signs and CBGs as ordered, Perform/monitor CIWA, COWS, AIMS and Fall Risk screenings as ordered, Perform wound care treatments as ordered.  Evaluation of Outcomes: Not Met   LCSW Treatment Plan for Primary Diagnosis: Depression Long Term Goal(s): Safe transition to appropriate next level of care at discharge, Engage patient in therapeutic group addressing interpersonal concerns.  Short Term Goals: Engage patient in aftercare planning with referrals and resources  Therapeutic Interventions: Assess for all discharge needs, 1 to 1 time with Social worker, Explore available resources and support systems, Assess for adequacy in community support network, Educate family and significant other(s) on suicide prevention, Complete Psychosocial Assessment, Interpersonal group therapy.  Evaluation of Outcomes: Not Met   Progress in Treatment: Attending groups: No. New to unit  Participating in groups: No. Taking medication as prescribed: Yes. Toleration medication: Yes. Family/Significant other contact made: No, will contact:  patient declined consent for  collateral contacts Patient understands diagnosis: Yes. Discussing patient identified problems/goals with staff: Yes. Medical problems stabilized or resolved: Yes. Denies suicidal/homicidal ideation: Yes. Issues/concerns per patient self-inventory: No. Other:   New problem(s) identified:   New Short Term/Long Term Goal(s): medication stabilization, elimination of SI thoughts, development of comprehensive mental wellness plan.    Patient Goals:    Discharge Plan or Barriers: Patient recently admitted. CSW will continue to follow and assess for appropriate referrals and possible discharge planning.    Reason for Continuation of Hospitalization: Anxiety Depression Medication stabilization Suicidal ideation  Estimated Length of Stay: 3-5 days   Attendees: Patient: 06/16/2019 10:39 AM  Physician: Dr. Myles Lipps, MD 06/16/2019 10:39 AM  Nursing: Legrand Como.Chauncey Cruel, RN 06/16/2019 10:39 AM  RN Care Manager: 06/16/2019 10:39 AM  Social Worker: Radonna Ricker, [place] 06/16/2019 10:39 AM  Recreational Therapist:  06/16/2019 10:39 AM  Other:  06/16/2019 10:39 AM  Other:  06/16/2019 10:39 AM  Other: 06/16/2019 10:39 AM    Scribe for Treatment Team: Marylee Floras, [place] 06/16/2019 10:39 AM

## 2019-06-16 NOTE — Plan of Care (Signed)
Progress note  D: pt found in bed; compliant with medication administration. Pt states his lower back is still hurting him. Pt denies any other physical complaints. Pt is animated and assertive in his demeanor. Pt has been resting most of the day. Pt denies si/hi/ah/vh and verbally agrees to approach staff if these become apparent or before harming himself/others while at [place]: Pt provided support and encouragement. Pt given medication per protocol and standing orders. Q27m safety checks implemented and continued.  R: Pt safe on the unit. Will continue to monitor.  Pt progressing in the following metrics  Problem: Education: Goal: Utilization of techniques to improve thought processes will improve Outcome: Progressing Goal: Knowledge of the prescribed therapeutic regimen will improve Outcome: Progressing   Problem: Activity: Goal: Interest or engagement in leisure activities will improve Outcome: Progressing Goal: Imbalance in normal sleep/wake cycle will improve Outcome: Progressing

## 2019-06-16 NOTE — Progress Notes (Signed)
Inpatient Diabetes Program Recommendations  AACE/ADA: New Consensus Statement on Inpatient Glycemic Control (2015)  Target Ranges:  Prepandial:   less than 140 mg/dL      Peak postprandial:   less than 180 mg/dL (1-2 hours)      Critically ill patients:  140 - 180 mg/dL   Lab Results  Component Value Date   GLUCAP 364 (H) 06/16/2019   HGBA1C 9.6 (H) 06/16/2019    Review of Glycemic Control  Orders: Levemir 30 units bid, Novolog 0-20 units tidwc  From progress note on 05/05/19:  Diabetes Coordinator has spoken to pt on 2/17 and 3/25 about his diabetes control and HgbA1C of 10.0%.  Talked with pt about importance of f/u with Dr. Brigitte Pulse. Pt seems depressed, states he had lows once in awhile, but not very often at home. States that he wants to stay another night in the hospital. Pt continued to talk about his stomach hurting and states it hurts at home, also.    Talked about lifestyle modifications with diet and exercise. Pt said he sometimes only eats 1-2 meals/day, due to abdominal pain. Very little exercise. States he checks blood sugars daily (2-3x) and stressed importance of f/u with MD. Michela Pitcher he had no other questions regarding his diabetes. Discussed 4th ED/Admission in the past 6 months.  Inpatient Diabetes Program Recommendations:     Decrease Novolog to 0-15 units tidwc and hs.  Will follow closely.  Thank you. Lorenda Peck, RD, LDN, CDE Inpatient Diabetes Coordinator [phone]
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
## 2019-06-16 NOTE — BHH Suicide Risk Assessment (Signed)
[facility] INPATIENT:  Family/Significant Other Suicide Prevention Education  Suicide Prevention Education:  Patient Refusal for Family/Significant Other Suicide Prevention Education: The patient [name] [name] has refused to provide written consent for family/significant other to be provided Family/Significant Other Suicide Prevention Education during admission and/or prior to discharge.  Physician notified.  SPE completed with patient, as patient refused to consent to family contact. SPI pamphlet provided to pt and pt was encouraged to share information with support network, ask questions, and talk about any concerns relating to SPE. Patient denies access to guns/firearms and verbalized understanding of information provided. Mobile Crisis information also provided to patient.    Marylee Floras 06/16/2019, 9:33 AM

## 2019-06-17 LAB — GLUCOSE, CAPILLARY
Glucose-Capillary: 73 mg/dL (ref 70–99)
Glucose-Capillary: 376 mg/dL — ABNORMAL HIGH (ref 70–99)
Glucose-Capillary: 234 mg/dL — ABNORMAL HIGH (ref 70–99)
Glucose-Capillary: 335 mg/dL — ABNORMAL HIGH (ref 70–99)

## 2019-06-17 MED ORDER — SERTRALINE HCL 50 MG PO TABS
50.0000 mg | ORAL_TABLET | Freq: Every day | ORAL | Status: DC
  Filled 2019-06-17 (×3): qty 1

## 2019-06-17 NOTE — BHH Group Notes (Signed)
[facility] LCSW Group Therapy Note  06/17/2019  10:00-11:00AM  Type of Therapy and Topic:  Group Therapy - Accepting We Are All Damaged People  Participation Level:  Minimal   Description of Group:  Patients in this group were asked to share whether they feel that they are "damaged" and explain their responses.  A song entitled "Damaged People" was then played, followed by a discussion of the relevance/relatedness of this song to each patient.   The conclusion of the group was that our goal as humans does not need to be perfection, but rather growth.  Insights among group members were shared, including that it is easy to point the fingers at others as being damaged, but actually we need to realize that we also are flawed humans with problems to overcome.  The group concluded with an emphasis on how this is ultimately a message of hope that we face struggles like every other person in the world, and that we are not alone.  Therapeutic Goals: 1)  introduce the concept of pain and hardship being universal  2)  connect emotionally to a musical message and to other group members  3)  identify the patient's current beliefs about their own broken methods of resolving their life problems to date, specifically related to this hospitalization  4)  allow time and space for patients to vent their pain and receive support from other patients  5)  elicit hope that arises from realizing we are not alone in our human struggles   Summary of Patient Progress:  The patient expressed that he does not feel he is "damaged," because he had a good childhood.  After other group members had shared, CSW came back to him to give him the opportunity to speak again and he said his medicine was making him dizzy, left the room to lie down.  Therapeutic Modalities:   Motivational Interviewing Activity  Maretta Los  06/17/2019 8:45 AM

## 2019-06-17 NOTE — Progress Notes (Signed)
DAR NOTE: Pt present with flat affect and depressed mood in the unit. Pt stated he is feeling guilty for attempting to kill himself, stated it has been hard coping with no job, family away from him and loss of his brother a year ago. Pt complained of hip pain , took all his meds as scheduled.  Pt's safety ensured with 15 minute and environmental checks. Pt currently denies SI/HI and A/V hallucinations. Pt verbally agrees to seek staff if SI/HI or A/VH occurs and to consult with staff before acting on these thoughts. Will continue POC.

## 2019-06-17 NOTE — BHH Group Notes (Signed)
[facility] Group Notes:  (Nursing/MHT/Case Management/Adjunct)  Date:  06/17/2019  Time:  1:15 PM  Type of Therapy:  Nurse Education  Participation Level:  Did Not Attend   Baron Sane 06/17/2019, 3:10 PM

## 2019-06-17 NOTE — Plan of Care (Signed)
Progress note  D: pt found in bed; compliant with medication administration. Pt has complaints from his chronic lower back pain. Pt denies any other symptoms. Pt is animated and assertive in his interaction. Pt states his family has plans to move back here soon. Pt has been resting in bed most of the day. Pt denies si/hi/ah/vh and verbally agrees to approach staff if these become apparent or before harming himself/others while at [facility]: Pt provided support and encouragement. Pt given medication per protocol and standing orders. Q22m safety checks implemented and continued.  R: Pt safe on the unit. Will continue to monitor.  Pt progressing in the following metrics  Problem: Coping: Goal: Coping ability will improve Outcome: Progressing Goal: Will verbalize feelings Outcome: Progressing   Problem: Health Behavior/Discharge Planning: Goal: Ability to make decisions will improve Outcome: Progressing Goal: Compliance with therapeutic regimen will improve Outcome: Progressing

## 2019-06-17 NOTE — Progress Notes (Signed)
Pt sts he feels better today.  Pt attends all required meetings and is involved in the dayroom.  Pt BS=335 and received coverage per order.  Pt sts he is looking forward to discharge. Pt c/o back pain at this time. Pt received pain medication and is sitting in dayroom watching a movie. Pt remains safe on unit

## 2019-06-17 NOTE — Progress Notes (Signed)
[facility] MD Progress Note  06/17/2019 9:00 AM [name] [name]  MRN:  [id number] Subjective:  [name] [name] reported " guilt and hopelessness"   Objective: [name] [name] observed sitting in day room interacting with peers.  He presents flat, guarded but pleasant.  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   Reported some remorse related to putting his self first while seeking this degree.  Reported his wife health has been declining, and he has been unable to care for her and now she is scheduled for surgery and plans to return back to [place].  Reported he currently resides here with his oldest daughter.  As he states they have 4 children. [name] [name] rates his depression 8 out of 10 with 10 being the worst during this assessment.  Reports worsening depressive symptoms when he is alone with his thoughts.  He denies suicidal or homicidal ideations.  Denies auditory or visual hallucinations.  Discussed titration to Zoloft 25 mg to 50 mg p.o. daily patient was receptive to plan.  Reports a good appetite.  States he is resting " okay" throughout the night.  Support encouragement and reassurance was provided.   Principal Problem: Depression Diagnosis: Principal Problem:   Depression Active Problems:   DM (diabetes mellitus), type 2, uncontrolled (HCC)   MDD (major depressive disorder), recurrent severe, without psychosis (HCC)  Total Time spent with patient: 15 minutes  Past Psychiatric History: Reported previous inpatient admission  Past Medical History:  Past Medical History:  Diagnosis Date  . Barrett's esophagus   . CAD (coronary artery disease)    a. NSTEMI 05/2014 - occluded RCA dominant proximal s/p asp-thrombectomy/DES to RCA, minimal LAD/LCx, EF 50% by cath, 65-70% by echo.  . Depression    . Diabetes type 2, uncontrolled (HCC)   . Former tobacco use   . GERD (gastroesophageal reflux disease)   . Hemorrhoids    hx of  . Hypercholesterolemia   . Hypertension   . MI (myocardial infarction) (HCC)   . Peripheral neuropathy     Past Surgical History:  Procedure Laterality Date  . LEFT HEART CATHETERIZATION WITH CORONARY ANGIOGRAM N/A 06/14/2014   Procedure: LEFT HEART CATHETERIZATION WITH CORONARY ANGIOGRAM;  Surgeon: Corky Crafts, MD;  Location: [facility] CATH LAB;  Service: Cardiovascular;  Laterality: N/A;  . TONSILLECTOMY     Family History:  Family History  Problem Relation Age of Onset  . Hypertension Mother   . Alzheimer's disease Mother   . Alcohol abuse Father   . Cancer Father 72       "Throat"  . Diabetes type II Brother   . Cancer Brother 68       throat cancer  . Heart disease Neg Hx    Family Psychiatric  History:  Social History:  Social History   Substance and Sexual Activity  Alcohol Use No  . Alcohol/week: 1.0 standard drinks  . Types: 1 Standard drinks or equivalent per week     Social History   Substance and Sexual Activity  Drug Use No    Social History   Socioeconomic History  . Marital status: Single    Spouse name: Not on file  . Number of children: Not on file  . Years of education: Not on file  . Highest education  level: Not on file  Occupational History  . Occupation: unemployed    Comment: applying for disability  Social Needs  . Financial resource strain: Not on file  . Food insecurity    Worry: Not on file    Inability: Not on file  . Transportation needs    Medical: Not on file    Non-medical: Not on file  Tobacco Use  . Smoking status: Former Smoker    Packs/day: 0.25    Years: 3.00    Pack years: 0.75    Quit date: 11/30/1998    Years since quitting: 20.5  . Smokeless tobacco: Never Used  Substance and Sexual Activity  . Alcohol use: No    Alcohol/week: 1.0 standard drinks    Types: 1 Standard drinks or  equivalent per week  . Drug use: No  . Sexual activity: Yes    Birth control/protection: None  Lifestyle  . Physical activity    Days per week: Not on file    Minutes per session: Not on file  . Stress: Not on file  Relationships  . Social Musicianconnections    Talks on phone: Not on file    Gets together: Not on file    Attends religious service: Not on file    Active member of club or organization: Not on file    Attends meetings of clubs or organizations: Not on file    Relationship status: Not on file  Other Topics Concern  . Not on file  Social History Narrative  . Not on file   Additional Social History:    Pain Medications: See MAR Prescriptions: See MAR- pt states he isn't taking his medications regularly Over the Counter: See MAR History of alcohol / drug use?: No history of alcohol / drug abuse                    Sleep: Fair  Appetite:  Fair  Current Medications: Current Facility-Administered Medications  Medication Dose Route Frequency Provider Last Rate Last Dose  . acetaminophen (TYLENOL) tablet 650 mg  650 mg Oral Q6H PRN Laveda AbbeParks, Laurie Britton, NP   650 mg at 06/16/19 0618  . alum & mag hydroxide-simeth (MAALOX/MYLANTA) 200-200-20 MG/5ML suspension 30 mL  30 mL Oral Q4H PRN Laveda AbbeParks, Laurie Britton, NP      . aspirin EC tablet 81 mg  81 mg Oral Daily Laveda AbbeParks, Laurie Britton, NP   81 mg at 06/17/19 0758  . buPROPion (WELLBUTRIN XL) 24 hr tablet 150 mg  150 mg Oral Daily Antonieta Pertlary, Greg Lawson, MD   150 mg at 06/17/19 0758  . calcium carbonate (TUMS - dosed in mg elemental calcium) chewable tablet 400 mg of elemental calcium  2 tablet Oral Daily Antonieta Pertlary, Greg Lawson, MD   400 mg of elemental calcium at 06/17/19 0758  . gabapentin (NEURONTIN) capsule 200 mg  200 mg Oral BID Antonieta Pertlary, Greg Lawson, MD   200 mg at 06/17/19 0758  . HYDROcodone-acetaminophen (NORCO) 10-325 MG per tablet 1 tablet  1 tablet Oral Q6H PRN Antonieta Pertlary, Greg Lawson, MD   1 tablet at 06/17/19 0806  .  hydrOXYzine (ATARAX/VISTARIL) tablet 25 mg  25 mg Oral TID PRN Laveda AbbeParks, Laurie Britton, NP   25 mg at 06/15/19 2120  . insulin aspart (novoLOG) injection 0-5 Units  0-5 Units Subcutaneous QHS Antonieta Pertlary, Greg Lawson, MD   4 Units at 06/16/19 2128  . insulin aspart (novoLOG) injection 0-9 Units  0-9 Units Subcutaneous TID WC Antonieta Pertlary, Greg Lawson,  MD   7 Units at 06/16/19 1723  . insulin detemir (LEVEMIR) injection 30 Units  30 Units Subcutaneous BID Laveda Abbe, NP   30 Units at 06/17/19 0759  . lisinopril (ZESTRIL) tablet 5 mg  5 mg Oral Daily Laveda Abbe, NP   5 mg at 06/17/19 0758  . magnesium hydroxide (MILK OF MAGNESIA) suspension 30 mL  30 mL Oral Daily PRN Laveda Abbe, NP      . metoprolol tartrate (LOPRESSOR) tablet 12.5 mg  12.5 mg Oral BID Laveda Abbe, NP   12.5 mg at 06/17/19 0759  . mirtazapine (REMERON) tablet 30 mg  30 mg Oral QHS Donell Sievert E, PA-C   30 mg at 06/16/19 2120  . nitroGLYCERIN (NITROSTAT) SL tablet 0.4 mg  0.4 mg Sublingual Q5 min PRN Laveda Abbe, NP      . ondansetron [facility]) tablet 4 mg  4 mg Oral Q8H PRN [name], MD      . pantoprazole (PROTONIX) EC tablet 40 mg  40 mg Oral Daily [name], MD   40 mg at 06/17/19 0758  . rosuvastatin (CRESTOR) tablet 40 mg  40 mg Oral Daily Laveda Abbe, NP   40 mg at 06/17/19 0759  . sertraline (ZOLOFT) tablet 25 mg  25 mg Oral Daily [name], MD   25 mg at 06/17/19 6553    Lab Results:  Results for orders placed or performed during the hospital encounter of 06/15/19 (from the past 48 hour(s))  SARS Coronavirus 2 (CEPHEID - Performed in [facility] hospital lab), Hosp Order     Status: None   Collection Time: 06/15/19  6:15 PM   Specimen: Nasopharyngeal Swab  Result Value Ref Range   SARS Coronavirus 2 NEGATIVE NEGATIVE    Comment: (NOTE) If result is NEGATIVE SARS-CoV-2 target nucleic acids are NOT DETECTED. The SARS-CoV-2 RNA is generally  detectable in upper and lower  respiratory specimens during the acute phase of infection. The lowest  concentration of SARS-CoV-2 viral copies this assay can detect is 250  copies / mL. A negative result does not preclude SARS-CoV-2 infection  and should not be used as the sole basis for treatment or other  patient management decisions.  A negative result may occur with  improper specimen collection / handling, submission of specimen other  than nasopharyngeal swab, presence of viral mutation(s) within the  areas targeted by this assay, and inadequate number of viral copies  (<250 copies / mL). A negative result must be combined with clinical  observations, patient history, and epidemiological information. If result is POSITIVE SARS-CoV-2 target nucleic acids are DETECTED. The SARS-CoV-2 RNA is generally detectable in upper and lower  respiratory specimens dur ing the acute phase of infection.  Positive  results are indicative of active infection with SARS-CoV-2.  Clinical  correlation with patient history and other diagnostic information is  necessary to determine patient infection status.  Positive results do  not rule out bacterial infection or co-infection with other viruses. If result is PRESUMPTIVE POSTIVE SARS-CoV-2 nucleic acids MAY BE PRESENT.   A presumptive positive result was obtained on the submitted specimen  and confirmed on repeat testing.  While 2019 novel coronavirus  (SARS-CoV-2) nucleic acids may be present in the submitted sample  additional confirmatory testing may be necessary for epidemiological  and / or clinical management purposes  to differentiate between  SARS-CoV-2 and other Sarbecovirus currently known to infect humans.  If clinically  indicated additional testing with an alternate test  methodology [phone]([id number]) is advised. The SARS-CoV-2 RNA is generally  detectable in upper and lower respiratory sp ecimens during the acute  phase of infection. The  expected result is Negative. Fact Sheet for Patients:  BoilerBrush.com.cyhttps://www.fda.gov/media/136312/download Fact Sheet for Healthcare Providers: https://pope.com/https://www.fda.gov/media/136313/download This test is not yet approved or cleared by the [place] FDA and has been authorized for detection and/or diagnosis of SARS-CoV-2 by FDA under an Emergency Use Authorization (EUA).  This EUA will remain in effect (meaning this test can be used) for the duration of the COVID-19 declaration under Section 564(b)(1) of the Act, 21 U.S.C. section 360bbb-3(b)(1), unless the authorization is terminated or revoked sooner. Performed at [facility], [address] [place], [place] [address]   Urine rapid drug screen (hosp performed)not at [facility]     Status: Abnormal   Collection Time: 06/15/19  8:19 PM  Result Value Ref Range   Opiates POSITIVE (A) NONE DETECTED   Cocaine NONE DETECTED NONE DETECTED   Benzodiazepines NONE DETECTED NONE DETECTED   Amphetamines NONE DETECTED NONE DETECTED   Tetrahydrocannabinol POSITIVE (A) NONE DETECTED   Barbiturates NONE DETECTED NONE DETECTED    Comment: (NOTE) DRUG SCREEN FOR MEDICAL PURPOSES ONLY.  IF CONFIRMATION IS NEEDED FOR ANY PURPOSE, NOTIFY LAB WITHIN 5 DAYS. LOWEST DETECTABLE LIMITS FOR URINE DRUG SCREEN Drug Class                     Cutoff (ng/mL) Amphetamine and metabolites    1000 Barbiturate and metabolites    200 Benzodiazepine                 200 Tricyclics and metabolites     300 Opiates and metabolites        300 Cocaine and metabolites        300 THC                            50 Performed at [facility], [address] [place], [place] [address]   Glucose, capillary     Status: Abnormal   Collection Time: 06/15/19  8:33 PM  Result Value Ref Range   Glucose-Capillary 188 (H) 70 - 99 mg/dL   Comment 1 Notify RN   Glucose, capillary     Status: Abnormal   Collection Time: 06/16/19  5:52 AM  Result Value Ref  Range   Glucose-Capillary 364 (H) 70 - 99 mg/dL  CBC     Status: None   Collection Time: 06/16/19  6:55 AM  Result Value Ref Range   WBC 5.1 4.0 - 10.5 K/uL   RBC 4.93 4.22 - 5.81 MIL/uL   Hemoglobin 13.8 13.0 - 17.0 g/dL   HCT 47.844.0 29.539.0 - 62.152.0 %   MCV 89.2 80.0 - 100.0 fL   MCH 28.0 26.0 - 34.0 pg   MCHC 31.4 30.0 - 36.0 g/dL   RDW 30.814.1 65.711.5 - 84.615.5 %   Platelets 348 150 - 400 K/uL   nRBC 0.0 0.0 - 0.2 %    Comment: Performed at [facility], [address] [place], [place] [address]  Comprehensive metabolic panel     Status: Abnormal   Collection Time: 06/16/19  6:55 AM  Result Value Ref Range   Sodium 138 135 - 145 mmol/L   Potassium 3.7 3.5 - 5.1 mmol/L   Chloride 105 98 - 111 mmol/L  CO2 21 (L) 22 - 32 mmol/L   Glucose, Bld 305 (H) 70 - 99 mg/dL   BUN 17 8 - 23 mg/dL   Creatinine, Ser 4.090.95 0.61 - 1.24 mg/dL   Calcium 9.1 8.9 - 81.110.3 mg/dL   Total Protein 7.2 6.5 - 8.1 g/dL   Albumin 4.0 3.5 - 5.0 g/dL   AST 25 15 - 41 U/L   ALT 40 0 - 44 U/L   Alkaline Phosphatase 141 (H) 38 - 126 U/L   Total Bilirubin 0.4 0.3 - 1.2 mg/dL   GFR calc non Af Amer >60 >60 mL/min   GFR calc Af Amer >60 >60 mL/min   Anion gap 12 5 - 15    Comment: Performed at [facility], [address] [place], [place] [address]  Hemoglobin A1c     Status: Abnormal   Collection Time: 06/16/19  6:55 AM  Result Value Ref Range   Hgb A1c MFr Bld 9.6 (H) 4.8 - 5.6 %    Comment: (NOTE) Pre diabetes:          5.7%-6.4% Diabetes:              >6.4% Glycemic control for   <7.0% adults with diabetes    Mean Plasma Glucose 228.82 mg/dL    Comment: Performed at [facility], [address] [place], [place] [address]  Ethanol     Status: None   Collection Time: 06/16/19  6:55 AM  Result Value Ref Range   Alcohol, Ethyl (B) <10 <10 mg/dL    Comment: (NOTE) Lowest detectable limit for serum alcohol is 10 mg/dL. For medical purposes only. Performed at [facility], [address] [place], [place] [address]   Lipid panel     Status: Abnormal   Collection Time: 06/16/19  6:55 AM  Result Value Ref Range   Cholesterol 263 (H) 0 - 200 mg/dL   Triglycerides 578376 (H) <150 mg/dL   HDL 37 (L) >46>40 mg/dL   Total CHOL/HDL Ratio 7.1 RATIO   VLDL 75 (H) 0 - 40 mg/dL   LDL Cholesterol 962151 (H) 0 - 99 mg/dL    Comment:        Total Cholesterol/HDL:CHD Risk Coronary Heart Disease Risk Table                     Men   Women  1/2 Average Risk   3.4   3.3  Average Risk       5.0   4.4  2 X Average Risk   9.6   7.1  3 X Average Risk  23.4   11.0        Use the calculated Patient Ratio above and the CHD Risk Table to determine the patient's CHD Risk.        ATP III CLASSIFICATION (LDL):  <100     mg/dL   Optimal  [id number]  mg/dL   Near or Above                    Optimal  130-159  mg/dL   Borderline  [id number]  mg/dL   High  >027>190     mg/dL   Very High Performed at [facility], [address] [place], [place] [address]   TSH     Status: None   Collection Time: 06/16/19  6:55 AM  Result Value Ref Range   TSH 2.264 0.350 - 4.500 uIU/mL  Comment: Performed by a 3rd Generation assay with a functional sensitivity of <=0.01 uIU/mL. Performed at [facility], [address] [place], [place] [address]   Glucose, capillary     Status: Abnormal   Collection Time: 06/16/19 12:13 PM  Result Value Ref Range   Glucose-Capillary 209 (H) 70 - 99 mg/dL  Glucose, capillary     Status: Abnormal   Collection Time: 06/16/19  5:19 PM  Result Value Ref Range   Glucose-Capillary 308 (H) 70 - 99 mg/dL  Glucose, capillary     Status: Abnormal   Collection Time: 06/16/19  8:03 PM  Result Value Ref Range   Glucose-Capillary 309 (H) 70 - 99 mg/dL  Glucose, capillary     Status: None   Collection Time: 06/17/19  6:17 AM  Result Value Ref Range   Glucose-Capillary 73 70 - 99 mg/dL    Blood Alcohol level:   Lab Results  Component Value Date   ETH <10 06/16/2019   ETH <10 01/14/2019    Metabolic Disorder Labs: Lab Results  Component Value Date   HGBA1C 9.6 (H) 06/16/2019   MPG 228.82 06/16/2019   MPG 240 01/14/2019   No results found for: PROLACTIN Lab Results  Component Value Date   CHOL 263 (H) 06/16/2019   TRIG 376 (H) 06/16/2019   HDL 37 (L) 06/16/2019   CHOLHDL 7.1 06/16/2019   VLDL 75 (H) 06/16/2019   LDLCALC 151 (H) 06/16/2019   LDLCALC 68 11/20/2018    Physical Findings: AIMS:  , ,  ,  ,    CIWA:    COWS:     Musculoskeletal: Strength & Muscle Tone: within normal limits Gait & Station: normal Patient leans: N/A  Psychiatric Specialty Exam: Physical Exam  Vitals reviewed. Constitutional: He appears well-developed.  Cardiovascular: Normal rate.  Psychiatric: He has a normal mood and affect. His behavior is normal.    Review of Systems  Psychiatric/Behavioral: Positive for depression and suicidal ideas. The patient is nervous/anxious.   All other systems reviewed and are negative.   Blood pressure 140/85, pulse 96, temperature 97.8 F (36.6 C), temperature source Oral, resp. rate 18, height  (1.702 m), weight 105.2 kg, SpO2 96 %.Body mass index is 36.34 kg/m.  General Appearance: Casual  Eye Contact:  Fair  Speech:  Clear and Coherent  Volume:  Normal  Mood:  Anxious and Depressed  Affect:  Congruent  Thought Process:  Coherent  Orientation:  Full (Time, Place, and Person)  Thought Content:  Hallucinations: None  Suicidal Thoughts:  Yes.  with intent/plan  Homicidal Thoughts:  No  Memory:  Immediate;   Fair Recent;   Fair Remote;   Fair  Judgement:  Fair  Insight:  Fair  Psychomotor Activity:  Normal  Concentration:  Concentration: Fair  Recall:  [employer] of Knowledge:  Fair  Language:  Fair  Akathisia:  No  Handed:  Right  AIMS (if indicated):     Assets:  Communication Skills Desire for Improvement Resilience Social Support   ADL's:  Intact  Cognition:  WNL  Sleep:  Number of Hours: 6.5     Treatment Plan Summary: Daily contact with patient to assess and evaluate symptoms and progress in treatment and Medication management   Continue with current treatment plan on 06/17/2019 as listed below except where noted  Major Depression:  Increase Zoloft 25 mg to 50 mg p.o. daily Continue Wellbutrin 150 mg p.o. daily Continue Neurontin 200 mg p.o. twice daily  Continue Vistaril 25 mg p.o. 3 times daily as needed Continue Remeron 30 mg p.o. nightly   CSW to continue working on discharge disposition Patient encouraged to participate within the therapeutic milieu  [place], NP 06/17/2019, 9:00 AM

## 2019-06-18 LAB — GLUCOSE, CAPILLARY
Glucose-Capillary: 141 mg/dL — ABNORMAL HIGH (ref 70–99)
Glucose-Capillary: 318 mg/dL — ABNORMAL HIGH (ref 70–99)
Glucose-Capillary: 250 mg/dL — ABNORMAL HIGH (ref 70–99)
Glucose-Capillary: 324 mg/dL — ABNORMAL HIGH (ref 70–99)

## 2019-06-18 MED ORDER — HYDROXYZINE HCL 50 MG PO TABS
50.0000 mg | ORAL_TABLET | Freq: Every day | ORAL | Status: DC
  Administered 2019-06-18: 50 mg via ORAL
  Filled 2019-06-18 (×5): qty 1

## 2019-06-18 NOTE — [facility] Group Notes (Signed)
Adult Psychoeducational Group Note  Date:  06/18/2019 Time:  10:03 PM  Group Topic/Focus:  Wrap-Up Group:   The focus of this group is to help patients review their daily goal of treatment and discuss progress on daily workbooks.  Participation Level:  Active  Participation Quality:  Appropriate and Attentive  Affect:  Appropriate  Cognitive:  Alert and Appropriate  Insight: Appropriate and Good  Engagement in Group:  Engaged  Modes of Intervention:  Discussion and Education  Additional Comments:  Pt attended and participated in wrap up group this evening and rated their day a 5/10. Pt did not completed their goal, which was to get discharged.   Cristi Loron 06/18/2019, 10:03 PM

## 2019-06-18 NOTE — [facility] Group Notes (Signed)
Life SKills  Date:  06/18/2019  Time:  1300  Type of Therapy:  Nurse Education  :  The gorup focuses on teaching patients how to identify their unhealthy coping skills and how to develop and practice newer and healtheir wasy to get their needs met.   Participation Level:  Did Not Attend  Participation Quality:    Affect:    Cognitive:    Insight:    Engagement in Group:    Modes of Intervention:    Summary of Progress/Problems:  Lauralyn Primes 06/18/2019, 3:50 PM

## 2019-06-18 NOTE — [facility] Group Notes (Signed)
[facility] LCSW Group Therapy Note  Date/Time:  06/18/2019    10:15-11:15AM  Type of Therapy and Topic:  Group Therapy:  Healthy and Unhealthy Supports  Participation Level:  Active  Description of Group:  Patients in this group were led to examine the current supports in their lives and determine whether those supports are healthy or unhealthy for them.   Setting boundaries was discussed and described in detail.  An emphasis was placed on using individual counselor, doctor, therapy groups, 12-step groups, and problem-specific support groups to expand supports.  Therapeutic Goals: 1) compare healthy versus unhealthy supports and identify some examples of each 2) discuss importance of setting boundaries or terminating relationships with unhealthy supports  3)  generate ideas and descriptions of healthy supports that can be added  4)  offer mutual support about how to address unhealthy supports  5)  encourage active participation in and adherence to discharge plan    Summary of Patient Progress:  The patient stated that current healthy supports in his life are his doctor and his education while current unhealthy supports include his wife who does not believe that mental health issues are real and tells him to "be a man."  The patient expressed that he has already taken his wife with him to his therapist and it did not change her opinion.  CSW suggested taking her to the doctor's visits so that a psychiatrist can tell her about the actual disease process.  He stated he himself is often unhealthy because he won't talk to his friends about what is going on, will instead "stuff it down."   Therapeutic Modalities:   Brief Solution-Focused Therapy  Selmer Dominion, LCSW

## 2019-06-18 NOTE — Progress Notes (Signed)
D: Patient presents depressed, anxious. Patient denies SI/HI/AVH. He has been having trouble falling asleep. He hopes to get referral to a psychiatrist outpatient. He is unsure of his living situation after discharge.  A: Patient checked q15 min, and checks reviewed. Reviewed medication changes with patient and educated on side effects. Educated patient on importance of attending group therapy sessions and educated on several coping skills. Encouarged participation in milieu through recreation therapy and attending meals with peers. Support and encouragement provided. Fluids offered. R: Patient receptive to education on medications, and is medication compliant. Patient contracts for safety on the unit.

## 2019-06-18 NOTE — Progress Notes (Addendum)
[facility]  06/18/2019 11:53 AM [name] DamesKevin J [name]  MRN:  [id number] Subjective:  [name] BeeKevin reported " feeling a little groggy after evening medications and yesterday"   Objective: [name] BeeKevin observed sitting in day room.  He presents flat guarded but pleasant.  Denying suicidal or homicidal ideations.  Denies auditory or visual hallucinations.  Patient rates his depression 4 out of 10 with 10 being the worst.  Reports grogginess with taking Remeron at night.  Discussed discontinuing Remeron will initiate Vistaril 50 mg p.o. nightly nightly.  He declined initiating trazodone at this time.  Patient reports feeling hopeful however continues to ruminate with " being a failure as a husband and a father" reports a good appetite.  States he is resting well throughout the night.  Support, encouragement and reassurance was provided.   Principal Problem: Depression Diagnosis: Principal Problem:   Depression Active Problems:   DM (diabetes mellitus), type 2, uncontrolled (HCC)   MDD (major depressive disorder), recurrent severe, without psychosis (HCC)  Total Time spent with patient: 15 minutes  Past Psychiatric History: Reported previous inpatient admission  Past Medical History:  Past Medical History:  Diagnosis Date  . Barrett's esophagus   . CAD (coronary artery disease)    a. NSTEMI 05/2014 - occluded RCA dominant proximal s/p asp-thrombectomy/DES to RCA, minimal LAD/LCx, EF 50% by cath, 65-70% by echo.  . Depression   . Diabetes type 2, uncontrolled (HCC)   . Former tobacco use   . GERD (gastroesophageal reflux disease)   . Hemorrhoids    hx of  . Hypercholesterolemia   . Hypertension   . MI (myocardial infarction) (HCC)   . Peripheral neuropathy     Past Surgical History:  Procedure Laterality Date  . LEFT HEART CATHETERIZATION WITH CORONARY ANGIOGRAM N/A 06/14/2014   Procedure: LEFT HEART CATHETERIZATION WITH CORONARY ANGIOGRAM;  Surgeon: Corky CraftsJayadeep S Varanasi, MD;  [facility];  Service: Cardiovascular;  Laterality: N/A;  . TONSILLECTOMY     Family History:  Family History  Problem Relation Age of Onset  . Hypertension Mother   . Alzheimer's disease Mother   . Alcohol abuse Father   . Cancer Father [id number]       "Throat"  . Diabetes type II Brother   . Cancer Brother 68       throat cancer  . Heart disease Neg Hx    Family Psychiatric  History:  Social History:  Social History   Substance and Sexual Activity  Alcohol Use No  . Alcohol/week: 1.0 standard drinks  . Types: 1 Standard drinks or equivalent per week     Social History   Substance and Sexual Activity  Drug Use No    Social History   Socioeconomic History  . Marital status: Single    Spouse name: Not on file  . Number of children: Not on file  . Years of education: Not on file  . Highest education level: Not on file  Occupational History  . Occupation: unemployed    Comment: applying for disability  Social Needs  . Financial resource strain: Not on file  . Food insecurity    Worry: Not on file    Inability: Not on file  . Transportation needs    Medical: Not on file    Non-medical: Not on file  Tobacco Use  . Smoking status: Former Smoker    Packs/day: 0.25    Years: 3.00    Pack years: 0.75    Quit date: 11/30/1998  Years since quitting: 20.5  . Smokeless tobacco: Never Used  Substance and Sexual Activity  . Alcohol use: No    Alcohol/week: 1.0 standard drinks    Types: 1 Standard drinks or equivalent per week  . Drug use: No  . Sexual activity: Yes    Birth control/protection: None  Lifestyle  . Physical activity    Days per week: Not on file    Minutes per session: Not on file  . Stress: Not on file  Relationships  . Social Musician on phone: Not on file    Gets together: Not on file    Attends religious service: Not on file    Active member of club or organization: Not on file    Attends meetings of clubs or organizations: Not on file     Relationship status: Not on file  Other Topics Concern  . Not on file  Social History Narrative  . Not on file   Additional Social History:    Pain Medications: See MAR Prescriptions: See MAR- pt states he isn't taking his medications regularly Over the Counter: See MAR History of alcohol / drug use?: No history of alcohol / drug abuse                    Sleep: Fair  Appetite:  Fair  Current Medications: Current Facility-Administered Medications  Medication Dose Route Frequency Provider Last Rate Last Dose  . acetaminophen (TYLENOL) tablet 650 mg  650 mg Oral Q6H PRN Laveda Abbe, NP   650 mg at 06/16/19 0618  . alum & mag hydroxide-simeth (MAALOX/MYLANTA) 200-200-20 MG/5ML suspension 30 mL  30 mL Oral Q4H PRN Laveda Abbe, NP      . aspirin EC tablet 81 mg  81 mg Oral Daily Laveda Abbe, NP   81 mg at 06/18/19 0915  . buPROPion (WELLBUTRIN XL) 24 hr tablet 150 mg  150 mg Oral Daily [name], MD   150 mg at 06/17/19 0758  . calcium carbonate (TUMS - dosed in mg elemental calcium) chewable tablet 400 mg of elemental calcium  2 tablet Oral Daily [name], MD   400 mg of elemental calcium at 06/17/19 0758  . gabapentin (NEURONTIN) capsule 200 mg  200 mg Oral BID [name], MD   200 mg at 06/18/19 0915  . HYDROcodone-acetaminophen (NORCO) 10-325 MG per tablet 1 tablet  1 tablet Oral Q6H PRN [name], MD   1 tablet at 06/18/19 0931  . hydrOXYzine (ATARAX/VISTARIL) tablet 25 mg  25 mg Oral TID PRN Laveda Abbe, NP   25 mg at 06/15/19 2120  . hydrOXYzine (ATARAX/VISTARIL) tablet 50 mg  50 mg Oral QHS Oneta Rack, NP      . insulin aspart (novoLOG) injection 0-5 Units  0-5 Units Subcutaneous QHS [name], MD   4 Units at 06/17/19 2152  . insulin aspart (novoLOG) injection 0-9 Units  0-9 Units Subcutaneous TID WC [name], MD   1 Units at 06/18/19 [phone]  . insulin detemir (LEVEMIR)  injection 30 Units  30 Units Subcutaneous BID Laveda Abbe, NP   30 Units at 06/18/19 0929  . lisinopril (ZESTRIL) tablet 5 mg  5 mg Oral Daily Laveda Abbe, NP   5 mg at 06/18/19 0920  . magnesium hydroxide (MILK OF MAGNESIA) suspension 30 mL  30 mL Oral Daily PRN Laveda Abbe, NP      .  metoprolol tartrate (LOPRESSOR) tablet 12.5 mg  12.5 mg Oral BID Ethelene Hal, NP   12.5 mg at 06/18/19 0919  . nitroGLYCERIN (NITROSTAT) SL tablet 0.4 mg  0.4 mg Sublingual Q5 min PRN Ethelene Hal, NP      . ondansetron [facility]) tablet 4 mg  4 mg Oral Q8H PRN Sharma Covert, MD      . pantoprazole (PROTONIX) EC tablet 40 mg  40 mg Oral Daily Sharma Covert, MD   40 mg at 06/18/19 0919  . rosuvastatin (CRESTOR) tablet 40 mg  40 mg Oral Daily Ethelene Hal, NP   40 mg at 06/18/19 0916  . sertraline (ZOLOFT) tablet 50 mg  50 mg Oral Daily Derrill Center, NP        Lab Results:  Results for orders placed or performed during the hospital encounter of 06/15/19 (from the past 48 hour(s))  Glucose, capillary     Status: Abnormal   Collection Time: 06/16/19 12:13 PM  Result Value Ref Range   Glucose-Capillary 209 (H) 70 - 99 mg/dL  Glucose, capillary     Status: Abnormal   Collection Time: 06/16/19  5:19 PM  Result Value Ref Range   Glucose-Capillary 308 (H) 70 - 99 mg/dL  Glucose, capillary     Status: Abnormal   Collection Time: 06/16/19  8:03 PM  Result Value Ref Range   Glucose-Capillary 309 (H) 70 - 99 mg/dL  Glucose, capillary     Status: None   Collection Time: 06/17/19  6:17 AM  Result Value Ref Range   Glucose-Capillary 73 70 - 99 mg/dL  Glucose, capillary     Status: Abnormal   Collection Time: 06/17/19 10:53 AM  Result Value Ref Range   Glucose-Capillary 376 (H) 70 - 99 mg/dL  Glucose, capillary     Status: Abnormal   Collection Time: 06/17/19  4:48 PM  Result Value Ref Range   Glucose-Capillary 234 (H) 70 - 99 mg/dL  Glucose,  capillary     Status: Abnormal   Collection Time: 06/17/19  8:45 PM  Result Value Ref Range   Glucose-Capillary 335 (H) 70 - 99 mg/dL  Glucose, capillary     Status: Abnormal   Collection Time: 06/18/19  5:44 AM  Result Value Ref Range   Glucose-Capillary 141 (H) 70 - 99 mg/dL   Comment 1 Notify RN    Comment 2 Document in Chart     Blood Alcohol level:  Lab Results  Component Value Date   ETH <10 06/16/2019   ETH <10 85/46/2703    Metabolic Disorder Labs: Lab Results  Component Value Date   HGBA1C 9.6 (H) 06/16/2019   MPG 228.82 06/16/2019   MPG 240 01/14/2019   No results found for: PROLACTIN Lab Results  Component Value Date   CHOL 263 (H) 06/16/2019   TRIG 376 (H) 06/16/2019   HDL 37 (L) 06/16/2019   CHOLHDL 7.1 06/16/2019   VLDL 75 (H) 06/16/2019   LDLCALC 151 (H) 06/16/2019   LDLCALC 68 11/20/2018    Physical Findings: AIMS: Facial and Oral Movements Muscles of Facial Expression: None, normal Lips and Perioral Area: None, normal Jaw: None, normal Tongue: None, normal,Extremity Movements Upper (arms, wrists, hands, fingers): None, normal Lower (legs, knees, ankles, toes): None, normal, Trunk Movements Neck, shoulders, hips: None, normal, Overall Severity Severity of abnormal movements (highest score from questions above): None, normal Incapacitation due to abnormal movements: None, normal Patient's awareness of abnormal movements (rate only patient's report):  No Awareness, Dental Status Current problems with teeth and/or dentures?: No Does patient usually wear dentures?: No  CIWA:  CIWA-Ar Total: 0 COWS:  COWS Total Score: 0  Musculoskeletal: Strength & Muscle Tone: within normal limits Gait & Station: normal Patient leans: N/A  Psychiatric Specialty Exam: Physical Exam  Vitals reviewed. Constitutional: He is oriented to person, place, and time. He appears well-developed.  Cardiovascular: Normal rate.  Neurological: He is alert and oriented to  person, place, and time.  Psychiatric: He has a normal mood and affect. His behavior is normal.    Review of Systems  Psychiatric/Behavioral: Positive for depression and suicidal ideas. The patient is nervous/anxious and has insomnia.   All other systems reviewed and are negative.   Blood pressure (!) 161/84, pulse 97, temperature 98.2 F (36.8 C), temperature source Oral, resp. rate 18, height 5\' 7"  (1.702 m), weight 105.2 kg, SpO2 96 %.Body mass index is 36.34 kg/m.  General Appearance: Casual  Eye Contact:  Fair  Speech:  Clear and Coherent  Volume:  Normal  Mood:  Anxious and Depressed  Affect:  Congruent  Thought Process:  Coherent  Orientation:  Full (Time, Place, and Person)  Thought Content:  Hallucinations: None  Suicidal Thoughts:  Yes.  with intent/plan  Homicidal Thoughts:  No  Memory:  Immediate;   Fair Recent;   Fair Remote;   Fair  Judgement:  Fair  Insight:  Fair  Psychomotor Activity:  Normal  Concentration:  Concentration: Fair  Recall:  [employer]Fair  Fund of Knowledge:  Fair  Language:  Fair  Akathisia:  No  Handed:  Right  AIMS (if indicated):     Assets:  Communication Skills Desire for Improvement Resilience Social Support  ADL's:  Intact  Cognition:  WNL  Sleep:  Number of Hours: 5.25     Treatment Plan Summary: Daily contact with patient to assess and evaluate symptoms and progress in treatment and Medication management   Continue with current treatment plan on 06/18/2019 as listed below except where noted  Major Depression:  Continue Zoloft  50 mg p.o. daily Continue Wellbutrin 150 mg p.o. daily Continue Neurontin 200 mg p.o. twice daily Continue Vistaril 25 mg p.o. 3 times daily as needed Discontinue Remeron 30 mg p.o. nightly Patient to start take Vistaril 50 mg PO QHS PRN    CSW to continue working on discharge disposition Patient encouraged to participate within the therapeutic milieu  Oneta Rackanika N Lewis, NP 06/18/2019, 11:53 AM

## 2019-06-19 DIAGNOSIS — G8929 Other chronic pain: Secondary | ICD-10-CM

## 2019-06-19 DIAGNOSIS — F411 Generalized anxiety disorder: Secondary | ICD-10-CM

## 2019-06-19 LAB — GLUCOSE, CAPILLARY
Glucose-Capillary: 267 mg/dL — ABNORMAL HIGH (ref 70–99)
Glucose-Capillary: 352 mg/dL — ABNORMAL HIGH (ref 70–99)
Glucose-Capillary: 313 mg/dL — ABNORMAL HIGH (ref 70–99)
Glucose-Capillary: 351 mg/dL — ABNORMAL HIGH (ref 70–99)

## 2019-06-19 MED ORDER — INSULIN DETEMIR 100 UNIT/ML ~~LOC~~ SOLN
34.0000 [IU] | Freq: Two times a day (BID) | SUBCUTANEOUS | Status: DC
  Administered 2019-06-19 – 2019-06-21 (×4): 34 [IU] via SUBCUTANEOUS

## 2019-06-19 MED ORDER — HYDROXYZINE HCL 25 MG PO TABS: 25.0000 mg | ORAL_TABLET | Freq: Four times a day (QID) | ORAL | Status: DC | PRN

## 2019-06-19 MED ORDER — INSULIN DETEMIR 100 UNIT/ML ~~LOC~~ SOLN: 35.0000 [IU] | Freq: Two times a day (BID) | SUBCUTANEOUS | Status: DC

## 2019-06-19 MED ORDER — LISINOPRIL 10 MG PO TABS
10.0000 mg | ORAL_TABLET | Freq: Every day | ORAL | Status: DC
  Administered 2019-06-20 – 2019-06-21 (×2): 10 mg via ORAL
  Filled 2019-06-19 (×3): qty 1

## 2019-06-19 MED ORDER — METOPROLOL TARTRATE 25 MG PO TABS
25.0000 mg | ORAL_TABLET | Freq: Two times a day (BID) | ORAL | Status: DC
  Administered 2019-06-19 – 2019-06-21 (×4): 25 mg via ORAL
  Filled 2019-06-19 (×6): qty 1

## 2019-06-19 MED ORDER — METOPROLOL TARTRATE 25 MG PO TABS
25.0000 mg | ORAL_TABLET | ORAL | Status: AC
  Administered 2019-06-19: 25 mg via ORAL
  Filled 2019-06-19 (×2): qty 1

## 2019-06-19 NOTE — Tx Team (Signed)
Interdisciplinary Treatment and Diagnostic Plan Update  06/19/2019 Time of Session:  [name] [name] MRN: [id number]  Principal Diagnosis: Depression  Secondary Diagnoses: Principal Problem:   Depression Active Problems:   DM (diabetes mellitus), type 2, uncontrolled (HCC)   MDD (major depressive disorder), recurrent severe, without psychosis (HCC)   Current Medications:  Current Facility-Administered Medications  Medication Dose Route Frequency Provider Last Rate Last Dose  . acetaminophen (TYLENOL) tablet 650 mg  650 mg Oral Q6H PRN Laveda AbbeParks, Laurie Britton, NP   650 mg at 06/16/19 0618  . alum & mag hydroxide-simeth (MAALOX/MYLANTA) 200-200-20 MG/5ML suspension 30 mL  30 mL Oral Q4H PRN Laveda AbbeParks, Laurie Britton, NP      . aspirin EC tablet 81 mg  81 mg Oral Daily Laveda AbbeParks, Laurie Britton, NP   81 mg at 06/19/19 [id number]  . buPROPion (WELLBUTRIN XL) 24 hr tablet 150 mg  150 mg Oral Daily Antonieta Pertlary, Greg Lawson, MD   150 mg at 06/19/19 [id number]  . calcium carbonate (TUMS - dosed in mg elemental calcium) chewable tablet 400 mg of elemental calcium  2 tablet Oral Daily Antonieta Pertlary, Greg Lawson, MD   400 mg of elemental calcium at 06/17/19 0758  . gabapentin (NEURONTIN) capsule 200 mg  200 mg Oral BID Antonieta Pertlary, Greg Lawson, MD   200 mg at 06/19/19 [id number]  . HYDROcodone-acetaminophen (NORCO) 10-325 MG per tablet 1 tablet  1 tablet Oral Q6H PRN Antonieta Pertlary, Greg Lawson, MD   1 tablet at 06/19/19 0810  . hydrOXYzine (ATARAX/VISTARIL) tablet 25 mg  25 mg Oral TID PRN Laveda AbbeParks, Laurie Britton, NP   25 mg at 06/15/19 2120  . hydrOXYzine (ATARAX/VISTARIL) tablet 50 mg  50 mg Oral QHS Oneta RackLewis, Tanika N, NP   50 mg at 06/18/19 2152  . insulin aspart (novoLOG) injection 0-5 Units  0-5 Units Subcutaneous QHS Antonieta Pertlary, Greg Lawson, MD   4 Units at 06/18/19 2155  . insulin aspart (novoLOG) injection 0-9 Units  0-9 Units Subcutaneous TID WC Antonieta Pertlary, Greg Lawson, MD   5 Units at 06/19/19 [phone]  . insulin detemir (LEVEMIR) injection 30 Units  30 Units  Subcutaneous BID Laveda AbbeParks, Laurie Britton, NP   30 Units at 06/19/19 [phone]  . lisinopril (ZESTRIL) tablet 5 mg  5 mg Oral Daily Laveda AbbeParks, Laurie Britton, NP   5 mg at 06/19/19 [id number]  . magnesium hydroxide (MILK OF MAGNESIA) suspension 30 mL  30 mL Oral Daily PRN Laveda AbbeParks, Laurie Britton, NP      . metoprolol tartrate (LOPRESSOR) tablet 12.5 mg  12.5 mg Oral BID Laveda AbbeParks, Laurie Britton, NP   12.5 mg at 06/19/19 [id number]  . nitroGLYCERIN (NITROSTAT) SL tablet 0.4 mg  0.4 mg Sublingual Q5 min PRN Laveda AbbeParks, Laurie Britton, NP      . ondansetron [facility](ZOFRAN) tablet 4 mg  4 mg Oral Q8H PRN Antonieta Pertlary, Greg Lawson, MD      . pantoprazole (PROTONIX) EC tablet 40 mg  40 mg Oral Daily Antonieta Pertlary, Greg Lawson, MD   40 mg at 06/19/19 [id number]  . rosuvastatin (CRESTOR) tablet 40 mg  40 mg Oral Daily Laveda AbbeParks, Laurie Britton, NP   40 mg at 06/19/19 [id number]  . sertraline (ZOLOFT) tablet 50 mg  50 mg Oral Daily Oneta RackLewis, Tanika N, NP       PTA Medications: Medications Prior to Admission  Medication Sig Dispense Refill Last Dose  . calcium carbonate (TUMS EX) 750 MG chewable tablet Chew 2 tablets by mouth daily.     . EQ ASPIRIN ADULT LOW DOSE 81  MG EC tablet TAKE ONE TABLET BY MOUTH ONCE DAILY (Patient taking differently: Take 81 mg by mouth daily. ) 90 tablet 0   . gabapentin (NEURONTIN) 100 MG capsule TAKE 2 CAPSULES BY MOUTH EVERY MORNING AND 2 CAPSULES BY MOUTH  AT BEDTIME (Patient taking differently: Take 200 mg by mouth 2 (two) times daily. ) 360 capsule 3   . HYDROcodone-acetaminophen (NORCO) 7.5-325 MG tablet Take 1 tablet by mouth every 6 (six) hours as needed for moderate pain. 120 tablet 0   . insulin aspart (NOVOLOG) 100 UNIT/ML injection Inject 0-15 Units into the skin 3 (three) times daily with meals for 30 days. For glucose 121 to 150 use 2 units, for 151 to 200 use 4 units, for 201-250 use 7 units, for 251 to 300 use 9 units, for 301 to 350 use 12 units, for 351 or greater use 15 units. 13.5 mL 0   . insulin detemir (LEVEMIR) 100 UNIT/ML  injection Inject 0.3 mLs (30 Units total) into the skin 2 (two) times daily for 30 days. 18 mL 0   . lisinopril (PRINIVIL,ZESTRIL) 5 MG tablet Take 1 tablet (5 mg total) by mouth daily for 30 days. 30 tablet 0   . metoprolol tartrate (LOPRESSOR) 25 MG tablet TAKE 1/2 TABLET(12.5 MG) BY MOUTH TWICE DAILY (Patient taking differently: Take 12.5 mg by mouth 2 (two) times daily. ) 90 tablet 3   . nitroGLYCERIN (NITROSTAT) 0.4 MG SL tablet Place 1 tablet (0.4 mg total) under the tongue every 5 (five) minutes as needed for chest pain (up to 3 doses). 25 tablet 0   . ondansetron (ZOFRAN ODT) 4 MG disintegrating tablet Take 1 tablet (4 mg total) by mouth every 8 (eight) hours as needed for nausea or vomiting. 20 tablet 0   . pantoprazole (PROTONIX) 40 MG tablet Take 1 tablet (40 mg total) by mouth daily. 90 tablet 0   . rosuvastatin (CRESTOR) 40 MG tablet TAKE 1 TABLET(40 MG) BY MOUTH DAILY (Patient taking differently: Take 40 mg by mouth daily. ) 90 tablet 0     Patient Stressors: Health problems Marital or family conflict  Patient Strengths: Ability for insight Average or above average intelligence Capable of independent living Curator fund of knowledge Motivation for treatment/growth Special hobby/interest  Treatment Modalities: Medication Management, Group therapy, Case management,  1 to 1 session with clinician, Psychoeducation, Recreational therapy.   Physician Treatment Plan for Primary Diagnosis: Depression Long Term Goal(s): Improvement in symptoms so as ready for discharge Improvement in symptoms so as ready for discharge   Short Term Goals: Ability to identify changes in lifestyle to reduce recurrence of condition will improve Ability to verbalize feelings will improve Ability to disclose and discuss suicidal ideas Ability to demonstrate self-control will improve Ability to identify and develop effective coping behaviors will improve Ability to maintain  clinical measurements within normal limits will improve Compliance with prescribed medications will improve Ability to identify changes in lifestyle to reduce recurrence of condition will improve Ability to verbalize feelings will improve Ability to disclose and discuss suicidal ideas Ability to demonstrate self-control will improve Ability to identify and develop effective coping behaviors will improve Ability to maintain clinical measurements within normal limits will improve Compliance with prescribed medications will improve  Medication Management: Evaluate patient's response, side effects, and tolerance of medication regimen.  Therapeutic Interventions: 1 to 1 sessions, Unit Group sessions and Medication administration.  Evaluation of Outcomes: Progressing  Physician Treatment Plan for Secondary Diagnosis: Principal Problem:  Depression Active Problems:   DM (diabetes mellitus), type 2, uncontrolled (HCC)   MDD (major depressive disorder), recurrent severe, without psychosis (HCC)  Long Term Goal(s): Improvement in symptoms so as ready for discharge Improvement in symptoms so as ready for discharge   Short Term Goals: Ability to identify changes in lifestyle to reduce recurrence of condition will improve Ability to verbalize feelings will improve Ability to disclose and discuss suicidal ideas Ability to demonstrate self-control will improve Ability to identify and develop effective coping behaviors will improve Ability to maintain clinical measurements within normal limits will improve Compliance with prescribed medications will improve Ability to identify changes in lifestyle to reduce recurrence of condition will improve Ability to verbalize feelings will improve Ability to disclose and discuss suicidal ideas Ability to demonstrate self-control will improve Ability to identify and develop effective coping behaviors will improve Ability to maintain clinical measurements  within normal limits will improve Compliance with prescribed medications will improve     Medication Management: Evaluate patient's response, side effects, and tolerance of medication regimen.  Therapeutic Interventions: 1 to 1 sessions, Unit Group sessions and Medication administration.  Evaluation of Outcomes: Progressing   RN Treatment Plan for Primary Diagnosis: Depression Long Term Goal(s): Knowledge of disease and therapeutic regimen to maintain health will improve  Short Term Goals: Ability to participate in decision making will improve, Ability to verbalize feelings will improve, Ability to disclose and discuss suicidal ideas and Ability to identify and develop effective coping behaviors will improve  Medication Management: RN will administer medications as ordered by provider, will assess and evaluate patient's response and provide education to patient for prescribed medication. RN will report any adverse and/or side effects to prescribing provider.  Therapeutic Interventions: 1 on 1 counseling sessions, Psychoeducation, Medication administration, Evaluate responses to treatment, Monitor vital signs and CBGs as ordered, Perform/monitor CIWA, COWS, AIMS and Fall Risk screenings as ordered, Perform wound care treatments as ordered.  Evaluation of Outcomes: Progressing   LCSW Treatment Plan for Primary Diagnosis: Depression Long Term Goal(s): Safe transition to appropriate next level of care at discharge, Engage patient in therapeutic group addressing interpersonal concerns.  Short Term Goals: Engage patient in aftercare planning with referrals and resources  Therapeutic Interventions: Assess for all discharge needs, 1 to 1 time with Social worker, Explore available resources and support systems, Assess for adequacy in community support network, Educate family and significant other(s) on suicide prevention, Complete Psychosocial Assessment, Interpersonal group therapy.  Evaluation  of Outcomes: Progressing   Progress in Treatment: Attending groups: Yes.  Participating in groups: Yes. Taking medication as prescribed: Yes. Toleration medication: Yes. Family/Significant other contact made: No, will contact:  patient declined consent for collateral contacts Patient understands diagnosis: Yes. Discussing patient identified problems/goals with staff: Yes. Medical problems stabilized or resolved: Yes. Denies suicidal/homicidal ideation: Yes. Issues/concerns per patient self-inventory: No. Other:   New problem(s) identified:   New Short Term/Long Term Goal(s): medication stabilization, elimination of SI thoughts, development of comprehensive mental wellness plan.    Patient Goals:    Discharge Plan or Barriers: Patient is currently homeless. He is unsure of any alternative living arrangements. CSW will continue to follow and assess for appropriate referrals and possible discharge planning.    Reason for Continuation of Hospitalization: Depression Medication stabilization Suicidal ideation  Estimated Length of Stay: 1-2 days    Attendees: Patient: 06/19/2019 9:16 AM  Physician: Dr. Landry MellowGreg Clary, MD 06/19/2019 9:16 AM  Nursing: Casimiro [email or web address], RN 06/19/2019 9:16 AM  RN Care Manager:  06/19/2019 9:16 AM  Social Worker: Baldo Daub, LCSWA 06/19/2019 9:16 AM  Recreational Therapist:  06/19/2019 9:16 AM  Other:  06/19/2019 9:16 AM  Other:  06/19/2019 9:16 AM  Other: 06/19/2019 9:16 AM    Scribe for Treatment Team: Maeola Sarah, LCSWA 06/19/2019 9:16 AM

## 2019-06-19 NOTE — [facility] Group Notes (Signed)
LCSW Group Therapy Note 06/19/2019 2:34 PM  Type of Therapy and Topic: Group Therapy: Overcoming Obstacles  Participation Level: Active  Description of Group:  In this group patients will be encouraged to explore what they see as obstacles to their own wellness and recovery. They will be guided to discuss their thoughts, feelings, and behaviors related to these obstacles. The group will process together ways to cope with barriers, with attention given to specific choices patients can make. Each patient will be challenged to identify changes they are motivated to make in order to overcome their obstacles. This group will be process-oriented, with patients participating in exploration of their own experiences as well as giving and receiving support and challenge from other group members.  Therapeutic Goals: 1. Patient will identify personal and current obstacles as they relate to admission. 2. Patient will identify barriers that currently interfere with their wellness or overcoming obstacles.  3. Patient will identify feelings, thought process and behaviors related to these barriers. 4. Patient will identify two changes they are willing to make to overcome these obstacles:   Summary of Patient Progress  [name] [name] was engaged and participated throughout the group session. [name] [name] reports his main obstacle is lack of finances, lack of supports and lack of stability.   Therapeutic Modalities:  Cognitive Behavioral Therapy Solution Focused Therapy Motivational Interviewing Relapse Prevention Therapy   Theresa Duty Clinical Social Worker

## 2019-06-19 NOTE — Progress Notes (Signed)
[facility]  06/19/2019 10:29 AM [name] [name]  MRN:  [id number] Subjective:  Patient is a 63 year old male with a past psychiatric history significant for depression who presented under involuntary commitment to the behavioral health hospital secondary to being petitioned by the local police.   Objective: Patient is seen and examined.  Patient is a 63 year old male with a past psychiatric history significant for major depression.  He is seen in follow-up.  He refused his Zoloft and Wellbutrin this morning.  He had dizziness, nausea, and some other side effects of feeling dissociated.  He was unsure which medication it was due to, so he refused both his Wellbutrin and Zoloft this a.m.  He stated he was not sure which one in particular.  We discussed the propensity for Zoloft to have some nausea, and we discussed that.  Reviewing his medications he stated he thought that he may have had some side effects with Zoloft previously.  We discussed stopping that.  We discussed the potential for changing to a different antidepressant, but at this point what we will do is give him the Wellbutrin XL, and see how he tolerates that first before making any medication changes with his psychiatric medicines.  His blood sugar still remains elevated.  He stated that his previous Lantus/Levemir dose had been as high as 45 units subcu twice daily.  He stated that that had recently been decreased to 30.  We discussed increasing his Lantus insulin up to 34 units twice daily so that he would not become necessarily hypoglycemic.  We decided to also continue the sliding scale.  Other causes of the dizziness may be his diabetes control as well as his hypertension.  His blood pressures been elevated since admission.  He also stated he felt like he may have had some heart flutter at night.  We discussed increasing his metoprolol to control his heart rate as well as lower his blood pressure.  This especially given his history of  heart disease.  He is on lisinopril as well, and this was restarted at a low dose given his creatinine.  He stated his previous lisinopril dose was as high as 40 mg.  We discussed potentially increasing that slightly.  He stated his mood is still low, but he denied suicidal ideation this morning.  Principal Problem: Depression Diagnosis: Principal Problem:   Depression Active Problems:   DM (diabetes mellitus), type 2, uncontrolled (HCC)   MDD (major depressive disorder), recurrent severe, without psychosis (HCC)  Total Time spent with patient: 20 minutes  Past Psychiatric History: See admission H&P  Past Medical History:  Past Medical History:  Diagnosis Date  . Barrett's esophagus   . CAD (coronary artery disease)    a. NSTEMI 05/2014 - occluded RCA dominant proximal s/p asp-thrombectomy/DES to RCA, minimal LAD/LCx, EF 50% by cath, 65-70% by echo.  . Depression   . Diabetes type 2, uncontrolled (HCC)   . Former tobacco use   . GERD (gastroesophageal reflux disease)   . Hemorrhoids    hx of  . Hypercholesterolemia   . Hypertension   . MI (myocardial infarction) (HCC)   . Peripheral neuropathy     Past Surgical History:  Procedure Laterality Date  . LEFT HEART CATHETERIZATION WITH CORONARY ANGIOGRAM N/A 06/14/2014   Procedure: LEFT HEART CATHETERIZATION WITH CORONARY ANGIOGRAM;  Surgeon: Corky CraftsJayadeep S Varanasi, MD;  [facility];  Service: Cardiovascular;  Laterality: N/A;  . TONSILLECTOMY     Family History:  Family  History  Problem Relation Age of Onset  . Hypertension Mother   . Alzheimer's disease Mother   . Alcohol abuse Father   . Cancer Father [id number]       "Throat"  . Diabetes type II Brother   . Cancer Brother 68       throat cancer  . Heart disease Neg Hx    Family Psychiatric  History: See admission H&P Social History:  Social History   Substance and Sexual Activity  Alcohol Use No  . Alcohol/week: 1.0 standard drinks  . Types: 1 Standard drinks or  equivalent per week     Social History   Substance and Sexual Activity  Drug Use No    Social History   Socioeconomic History  . Marital status: Single    Spouse name: Not on file  . Number of children: Not on file  . Years of education: Not on file  . Highest education level: Not on file  Occupational History  . Occupation: unemployed    Comment: applying for disability  Social Needs  . Financial resource strain: Not on file  . Food insecurity    Worry: Not on file    Inability: Not on file  . Transportation needs    Medical: Not on file    Non-medical: Not on file  Tobacco Use  . Smoking status: Former Smoker    Packs/day: 0.25    Years: 3.00    Pack years: 0.75    Quit date: 11/30/1998    Years since quitting: 20.5  . Smokeless tobacco: Never Used  Substance and Sexual Activity  . Alcohol use: No    Alcohol/week: 1.0 standard drinks    Types: 1 Standard drinks or equivalent per week  . Drug use: No  . Sexual activity: Yes    Birth control/protection: None  Lifestyle  . Physical activity    Days per week: Not on file    Minutes per session: Not on file  . Stress: Not on file  Relationships  . Social Musicianconnections    Talks on phone: Not on file    Gets together: Not on file    Attends religious service: Not on file    Active member of club or organization: Not on file    Attends meetings of clubs or organizations: Not on file    Relationship status: Not on file  Other Topics Concern  . Not on file  Social History Narrative  . Not on file   Additional Social History:    Pain Medications: See MAR Prescriptions: See MAR- pt states he isn't taking his medications regularly Over the Counter: See MAR History of alcohol / drug use?: No history of alcohol / drug abuse                    Sleep: Fair  Appetite:  Fair  Current Medications: Current Facility-Administered Medications  Medication Dose Route Frequency Provider Last Rate Last Dose  .  acetaminophen (TYLENOL) tablet 650 mg  650 mg Oral Q6H PRN Laveda AbbeParks, Laurie Britton, NP   650 mg at 06/16/19 0618  . alum & mag hydroxide-simeth (MAALOX/MYLANTA) 200-200-20 MG/5ML suspension 30 mL  30 mL Oral Q4H PRN Laveda AbbeParks, Laurie Britton, NP      . aspirin EC tablet 81 mg  81 mg Oral Daily Laveda AbbeParks, Laurie Britton, NP   81 mg at 06/19/19 [id number]  . buPROPion (WELLBUTRIN XL) 24 hr tablet 150 mg  150 mg Oral Daily Landry Mellowlary, Masahiro Iglesia  Hart Rochester, MD   150 mg at 06/19/19 1657  . calcium carbonate (TUMS - dosed in mg elemental calcium) chewable tablet 400 mg of elemental calcium  2 tablet Oral Daily [name], MD   400 mg of elemental calcium at 06/17/19 0758  . gabapentin (NEURONTIN) capsule 200 mg  200 mg Oral BID [name], MD   200 mg at 06/19/19 9038  . HYDROcodone-acetaminophen (NORCO) 10-325 MG per tablet 1 tablet  1 tablet Oral Q6H PRN [name], MD   1 tablet at 06/19/19 0810  . hydrOXYzine (ATARAX/VISTARIL) tablet 25 mg  25 mg Oral TID PRN Laveda Abbe, NP   25 mg at 06/15/19 2120  . hydrOXYzine (ATARAX/VISTARIL) tablet 50 mg  50 mg Oral QHS Oneta Rack, NP   50 mg at 06/18/19 2152  . insulin aspart (novoLOG) injection 0-5 Units  0-5 Units Subcutaneous QHS [name], MD   4 Units at 06/18/19 2155  . insulin aspart (novoLOG) injection 0-9 Units  0-9 Units Subcutaneous TID WC [name], MD   5 Units at 06/19/19 [phone]  . insulin detemir (LEVEMIR) injection 35 Units  35 Units Subcutaneous BID [name], MD      . Melene Muller ON 06/20/2019] lisinopril (ZESTRIL) tablet 10 mg  10 mg Oral Daily [name], MD      . magnesium hydroxide (MILK OF MAGNESIA) suspension 30 mL  30 mL Oral Daily PRN Laveda Abbe, NP      . metoprolol tartrate (LOPRESSOR) tablet 25 mg  25 mg Oral BID [name], MD      . metoprolol tartrate (LOPRESSOR) tablet 25 mg  25 mg Oral NOW [name], MD      . nitroGLYCERIN (NITROSTAT) SL tablet 0.4 mg  0.4 mg  Sublingual Q5 min PRN Laveda Abbe, NP      . [facility]) tablet 4 mg  4 mg Oral Q8H PRN [name], MD      . pantoprazole (PROTONIX) EC tablet 40 mg  40 mg Oral Daily [name], MD   40 mg at 06/19/19 3291  . rosuvastatin (CRESTOR) tablet 40 mg  40 mg Oral Daily Laveda Abbe, NP   40 mg at 06/19/19 9166    Lab Results:  Results for orders placed or performed during the hospital encounter of 06/15/19 (from the past 48 hour(s))  Glucose, capillary     Status: Abnormal   Collection Time: 06/17/19 10:53 AM  Result Value Ref Range   Glucose-Capillary 376 (H) 70 - 99 mg/dL  Glucose, capillary     Status: Abnormal   Collection Time: 06/17/19  4:48 PM  Result Value Ref Range   Glucose-Capillary 234 (H) 70 - 99 mg/dL  Glucose, capillary     Status: Abnormal   Collection Time: 06/17/19  8:45 PM  Result Value Ref Range   Glucose-Capillary 335 (H) 70 - 99 mg/dL  Glucose, capillary     Status: Abnormal   Collection Time: 06/18/19  5:44 AM  Result Value Ref Range   Glucose-Capillary 141 (H) 70 - 99 mg/dL   Comment 1 Notify RN    Comment 2 Document in Chart   Glucose, capillary     Status: Abnormal   Collection Time: 06/18/19 12:05 PM  Result Value Ref Range   Glucose-Capillary 318 (H) 70 - 99 mg/dL  Glucose, capillary     Status: Abnormal   Collection Time: 06/18/19  4:45 PM  Result Value Ref Range   Glucose-Capillary 250 (H) 70 - 99 mg/dL  Glucose, capillary     Status: Abnormal   Collection Time: 06/18/19  8:36 PM  Result Value Ref Range   Glucose-Capillary 324 (H) 70 - 99 mg/dL   Comment 1 Notify RN    Comment 2 Document in Chart   Glucose, capillary     Status: Abnormal   Collection Time: 06/19/19  5:42 AM  Result Value Ref Range   Glucose-Capillary 267 (H) 70 - 99 mg/dL   Comment 1 Notify RN    Comment 2 Document in Chart     Blood Alcohol level:  Lab Results  Component Value Date   ETH <10 06/16/2019   ETH <10 01/14/2019     Metabolic Disorder Labs: Lab Results  Component Value Date   HGBA1C 9.6 (H) 06/16/2019   MPG 228.82 06/16/2019   MPG 240 01/14/2019   No results found for: PROLACTIN Lab Results  Component Value Date   CHOL 263 (H) 06/16/2019   TRIG 376 (H) 06/16/2019   HDL 37 (L) 06/16/2019   CHOLHDL 7.1 06/16/2019   VLDL 75 (H) 06/16/2019   LDLCALC 151 (H) 06/16/2019   LDLCALC 68 11/20/2018    Physical Findings: AIMS: Facial and Oral Movements Muscles of Facial Expression: None, normal Lips and Perioral Area: None, normal Jaw: None, normal Tongue: None, normal,Extremity Movements Upper (arms, wrists, hands, fingers): None, normal Lower (legs, knees, ankles, toes): None, normal, Trunk Movements Neck, shoulders, hips: None, normal, Overall Severity Severity of abnormal movements (highest score from questions above): None, normal Incapacitation due to abnormal movements: None, normal Patient's awareness of abnormal movements (rate only patient's report): No Awareness, Dental Status Current problems with teeth and/or dentures?: No Does patient usually wear dentures?: No  CIWA:  CIWA-Ar Total: 0 COWS:  COWS Total Score: 0  Musculoskeletal: Strength & Muscle Tone: within normal limits Gait & Station: normal Patient leans: N/A  Psychiatric Specialty Exam: Physical Exam  Nursing note and vitals reviewed. Constitutional: He is oriented to person, place, and time. He appears well-developed and well-nourished.  HENT:  Head: Normocephalic and atraumatic.  Respiratory: Effort normal.  Neurological: He is alert and oriented to person, place, and time.    ROS  Blood pressure (!) 146/94, pulse 97, temperature 98.3 F (36.8 C), resp. rate 20, height 5\' 7"  (1.702 m), weight 105.2 kg, SpO2 96 %.Body mass index is 36.34 kg/m.  General Appearance: Casual  Eye Contact:  Fair  Speech:  Normal Rate  Volume:  Decreased  Mood:  Depressed  Affect:  Congruent  Thought Process:  Coherent and  Descriptions of Associations: Intact  Orientation:  Full (Time, Place, and Person)  Thought Content:  Logical  Suicidal Thoughts:  No  Homicidal Thoughts:  No  Memory:  Immediate;   Fair Recent;   Fair Remote;   Fair  Judgement:  Intact  Insight:  Fair  Psychomotor Activity:  Normal  Concentration:  Concentration: Fair and Attention Span: Fair  Recall:  F[employer]ervFair  Fund of Knowledge:  Fair  Language:  Good  Akathisia:  Negative  Handed:  Right  AIMS (if indicated):     Assets:  Desire for Improvement Resilience  ADL's:  Intact  Cognition:  WNL  Sleep:  Number of Hours: 5     Treatment Plan Summary: Daily contact with patient to assess and evaluate symptoms and progress in treatment, Medication management and Plan : Patient is seen and examined.  Patient is a 63 year old male with the above-stated past medical and psychiatric history who is seen in follow-up.   Diagnosis: #1 major depression, recurrent, severe without psychotic features, #2 generalized anxiety disorder, #3 poorly controlled diabetes mellitus type 2, #3 hypertension, #4 coronary artery disease, #5 hyperlipidemia, #6 chronic pain, #7 history of diabetic ketoacidosis, #8 lumbar radiculopathy, #9 osteoarthritis  Patient is seen in follow-up.  Unfortunately his mood is essentially unchanged, and he has had some side effects.  Most likely the nausea is secondary to the Zoloft.  We have decided to stop that today.  We will continue the Wellbutrin XL for now.  We will see if there are side effects associated with it.  If necessary that may need to be changed tomorrow.  His blood sugar remains elevated.  His sugar this morning is 267.  We will increase his Levemir to 34 units subcu twice daily.  He stated that his previous dosage had been as high as 45 units twice daily but had gotten hypoglycemic with that.  We will continue the sliding scale.  With regard to his blood pressure as well as his coronary artery disease his blood  pressures have been elevated, and his heart rate is been in the 90s.  Given his coronary artery disease I will increase his metoprolol short acting to 25 mg p.o. twice daily.  We will give him 25 mg right now.  Hopefully these changes will assist in his recovery. 1.  Continue Wellbutrin XL 150 mg p.o. daily for depression. 2.  Continue elemental calcium through [employer] for nutritional supplementation and vitamin D. 3.  Continue Neurontin 200 mg p.o. twice daily for peripheral neuropathy and chronic pain. 4.  Continue hydroxyzine 50 mg p.o. nightly for insomnia. 5.  Continue hydroxyzine 25 mg p.o. every 6 hours as needed anxiety. 6.  Continue sliding scale insulin. 7.  Increase Levemir to 34 units subcu twice daily for diabetic control. 8.  Increase lisinopril to 10 mg p.o. daily for hypertension as well as kidney protection. 9.  Increase metoprolol short acting to 25 mg p.o. twice daily for hypertension and coronary artery disease protection. 10.  Continue nitroglycerin sublingual as needed chest pain. 11.  Continue Crestor 40 mg p.o. daily for hyperlipidemia. 12.  Continue Zofran 4 mg p.o. every 8 hours as needed nausea. 13.  Continue Protonix 40 mg p.o. daily for gastric protection. 14.  Continue hydrocodone 10/325 1 tablet p.o. every 6 hours as needed pain. 15.  Continue coated aspirin 81 mg p.o. daily for coronary artery disease. 16.  Disposition planning-in progress.  Sharma Covert, MD 06/19/2019, 10:29 AM

## 2019-06-19 NOTE — Plan of Care (Signed)
Progress note  D: pt found in bed; compliant with medication administration. Pt has no complaints except from his chronic lower back pain. Pt states he slept poorly. Pt is animated and in good spirits. Pt denies si/hi/ah/vh and verbally agrees to approach staff if these become apparent or before harming himself/others while at [facility]: Pt provided support and encouragement. Pt given medication per protocol and standing orders. Q32m safety checks implemented and continued.  R: Pt safe on the unit. Will continue to monitor.  Pt progressing in the following metrics  Problem: Role Relationship: Goal: Will demonstrate positive changes in social behaviors and relationships Outcome: Progressing   Problem: Safety: Goal: Ability to disclose and discuss suicidal ideas will improve Outcome: Progressing Goal: Ability to identify and utilize support systems that promote safety will improve Outcome: Progressing   Problem: Self-Concept: Goal: Will verbalize positive feelings about self Outcome: Progressing

## 2019-06-19 NOTE — Progress Notes (Signed)
Inpatient Diabetes Program Recommendations  AACE/ADA: New Consensus Statement on Inpatient Glycemic Control (2015)  Target Ranges:  Prepandial:   less than 140 mg/dL      Peak postprandial:   less than 180 mg/dL (1-2 hours)      Critically ill patients:  140 - 180 mg/dL   Results for [name] [name], [name] [name] (MRN [id number]) as of 06/19/2019 08:59  Ref. Range 06/18/2019 05:44 06/18/2019 12:05 06/18/2019 16:45 06/18/2019 20:36 06/19/2019 05:42  Glucose-Capillary Latest Ref Range: 70 - 99 mg/dL 141 (H) 318 (H) 250 (H) 324 (H) 267 (H)    Review of Glycemic Control  Diabetes history: DM 2 Outpatient Diabetes medications: Levemir 30 units bid, Novolog 0-15 units,  Current orders for Inpatient glycemic control: Levemir 30 units bid, Novolog 0-9 units tid + Novolog 0-5 units qhs  Inpatient Diabetes Program Recommendations:    Glucose trends increase after meals. Consider:  -   Novolog 3 units tid meal coverage. -   Novolog 0-15 units tid.  Thanks,  Tama Headings RN, MSN, BC-ADM Inpatient Diabetes Coordinator Team Pager [phone] (8a-5p)

## 2019-06-19 NOTE — Progress Notes (Signed)
D: Pt denies SI/HI/AVH. Pt is pleasant and cooperative. Pt stated he felt the same. Pt appeared visibly sad, but would brighten briefly when engaged.  A: Pt was offered support and encouragement. Pt was given scheduled medications. Pt was encourage to attend groups. Q 15 minute checks were done for safety.  R:Pt attends groups and interacts  with peers and staff. Pt is taking medication. Pt has no complaints.Pt receptive to treatment and safety maintained on unit.

## 2019-06-19 NOTE — [facility] Group Notes (Signed)
Adult Psychoeducational Group Note  Date:  06/19/2019 Time:  9:58 PM  Group Topic/Focus:  Wrap-Up Group:   The focus of this group is to help patients review their daily goal of treatment and discuss progress on daily workbooks.  Participation Level:  Active  Participation Quality:  Appropriate and Attentive  Affect:  Appropriate  Cognitive:  Alert and Appropriate  Insight: Appropriate and Good  Engagement in Group:  Engaged  Modes of Intervention:  Discussion and Education  Additional Comments:  Pt attended and participated in group this evening and rated their day a 5/10. Pt did not have a goal that they were working on today.   Cristi Loron 06/19/2019, 9:58 PM

## 2019-06-20 ENCOUNTER — Encounter: Payer: Medicare HMO | Admitting: Registered Nurse

## 2019-06-20 LAB — GLUCOSE, CAPILLARY
Glucose-Capillary: 136 mg/dL — ABNORMAL HIGH (ref 70–99)
Glucose-Capillary: 282 mg/dL — ABNORMAL HIGH (ref 70–99)
Glucose-Capillary: 389 mg/dL — ABNORMAL HIGH (ref 70–99)
Glucose-Capillary: 298 mg/dL — ABNORMAL HIGH (ref 70–99)

## 2019-06-20 NOTE — Plan of Care (Signed)
Progress note  D: pt found in bed; compliant with medication administration. Pt still has complaints of his chronic pain his back and hips. Pt continues to improve, but still complains of insomnia. Pt is still animated and in good spirits, conversing with other patients frequently. Pt denies si/hi/ah/vh and verbally agrees to approach staff if these become apparent or before harming himself/others while at [facility]: Pt provided support and encouragement. Pt given medication per protocol and standing orders. Q33m safety checks implemented and continued.  R: Pt safe on the unit. Will continue to monitor.  Pt progressing in the following metrics  Problem: Self-Concept: Goal: Level of anxiety will decrease Outcome: Progressing   Problem: Education: Goal: Knowledge of [facility] Education information/materials will improve Outcome: Progressing Goal: Emotional status will improve Outcome: Progressing Goal: Mental status will improve Outcome: Progressing

## 2019-06-20 NOTE — Progress Notes (Signed)
Inpatient Diabetes Program Recommendations  AACE/ADA: New Consensus Statement on Inpatient Glycemic Control (2015)  Target Ranges:  Prepandial:   less than 140 mg/dL      Peak postprandial:   less than 180 mg/dL (1-2 hours)      Critically ill patients:  140 - 180 mg/dL    Results for IAAN, OREGEL (MRN [id number]) as of 06/20/2019 08:48  Ref. Range 06/19/2019 05:42 06/19/2019 12:01 06/19/2019 17:01 06/19/2019 20:27 06/20/2019 05:47  Glucose-Capillary Latest Ref Range: 70 - 99 mg/dL 267 (H) 352 (H) 313 (H) 351 (H) 136 (H)    Review of Glycemic Control  Diabetes history: DM 2 Outpatient Diabetes medications: Levemir 30 units bid, Novolog 0-15 units,  Current orders for Inpatient glycemic control: Levemir 30 units bid, Novolog 0-9 units tid + Novolog 0-5 units qhs  Inpatient Diabetes Program Recommendations:    Glucose trends increase after meals. Consider:  -   Novolog 3 units tid meal coverage. -   Novolog 0-15 units tid.  Thanks,  Tama Headings RN, MSN, BC-ADM Inpatient Diabetes Coordinator Team Pager [phone] (8a-5p)

## 2019-06-20 NOTE — Progress Notes (Signed)
[facility] MD Progress Note  06/20/2019 1:44 PM [name] [name]  MRN:  [id number] Subjective:  Patient reports some improvement compared to admission, but describes lingering depression, anxiety, mainly with regards to homelessness. Today denies suicidal ideations, and is focusing more on disposition planning options. Of note, reports he is hoping to discharge soon, as he has an upcoming appointment at his outpatient pain clinic which he does not want to miss. States " if I miss it , they won't be able to see me for several weeks". Currently denies medication side effects.  Objective:  I have discussed case with treatment team and have met with patient. 63 year old male , presented for worsening depression , suicidal ideations. Reported significant psychosocial stressors, including homelessness and separation from his wife/children who moved to [place].  Today patient reports some improvement but does continue to endorse some depression and presents with a constricted and vaguely anxious affect. Denies suicidal ideations and as above, is future oriented, focused on outpatient treatment and appointments following discharge. I met with patient along with CSW, Jolan, during part of today's session and disposition options were reviewed. Referral to [facility] has been considered but currently patient is ambivalent partly due to concerns about transportation ( states he has a car but currently unable to afford fuel). Was recently started on Wellbutrin XL trial, denies medication side effects thus far . Side effects, including potential risk of seizures, has been reviewed . No disruptive or agitated behaviors on unit.  CBGs have trended down and today are 136, 282. Staff reports patient has been less isolative and more interactive with peers .   Principal Problem: Depression Diagnosis: Principal Problem:   Depression Active Problems:   DM (diabetes mellitus), type 2, uncontrolled ([place])   MDD (major  depressive disorder), recurrent severe, without psychosis ([place])  Total Time spent with patient: 20 minutes  Past Psychiatric History: See admission H&P  Past Medical History:  Past Medical History:  Diagnosis Date  . Barrett's esophagus   . CAD (coronary artery disease)    a. NSTEMI 05/2014 - occluded RCA dominant proximal s/p asp-thrombectomy/DES to RCA, minimal LAD/LCx, EF 50% by cath, 65-70% by echo.  . Depression   . Diabetes type 2, uncontrolled ([place])   . Former tobacco use   . GERD (gastroesophageal reflux disease)   . Hemorrhoids    hx of  . Hypercholesterolemia   . Hypertension   . MI (myocardial infarction) ([place])   . Peripheral neuropathy     Past Surgical History:  Procedure Laterality Date  . LEFT HEART CATHETERIZATION WITH CORONARY ANGIOGRAM N/A 06/14/2014   Procedure: LEFT HEART CATHETERIZATION WITH CORONARY ANGIOGRAM;  Surgeon: Jettie Booze, MD;  Location: [facility] CATH LAB;  Service: Cardiovascular;  Laterality: N/A;  . TONSILLECTOMY     Family History:  Family History  Problem Relation Age of Onset  . Hypertension Mother   . Alzheimer's disease Mother   . Alcohol abuse Father   . Cancer Father 52       "Throat"  . Diabetes type II Brother   . Cancer Brother 68       throat cancer  . Heart disease Neg Hx    Family Psychiatric  History: See admission H&P Social History:  Social History   Substance and Sexual Activity  Alcohol Use No  . Alcohol/week: 1.0 standard drinks  . Types: 1 Standard drinks or equivalent per week     Social History   Substance and Sexual Activity  Drug  Use No    Social History   Socioeconomic History  . Marital status: Single    Spouse name: Not on file  . Number of children: Not on file  . Years of education: Not on file  . Highest education level: Not on file  Occupational History  . Occupation: unemployed    Comment: applying for disability  Social Needs  . Financial resource strain: Not on file  . Food  insecurity    Worry: Not on file    Inability: Not on file  . Transportation needs    Medical: Not on file    Non-medical: Not on file  Tobacco Use  . Smoking status: Former Smoker    Packs/day: 0.25    Years: 3.00    Pack years: 0.75    Quit date: 11/30/1998    Years since quitting: 20.5  . Smokeless tobacco: Never Used  Substance and Sexual Activity  . Alcohol use: No    Alcohol/week: 1.0 standard drinks    Types: 1 Standard drinks or equivalent per week  . Drug use: No  . Sexual activity: Yes    Birth control/protection: None  Lifestyle  . Physical activity    Days per week: Not on file    Minutes per session: Not on file  . Stress: Not on file  Relationships  . Social Herbalist on phone: Not on file    Gets together: Not on file    Attends religious service: Not on file    Active member of club or organization: Not on file    Attends meetings of clubs or organizations: Not on file    Relationship status: Not on file  Other Topics Concern  . Not on file  Social History Narrative  . Not on file   Additional Social History:    Pain Medications: See MAR Prescriptions: See MAR- pt states he isn't taking his medications regularly Over the Counter: See MAR History of alcohol / drug use?: No history of alcohol / drug abuse  Sleep: improving   Appetite:  Fair  Current Medications: Current Facility-Administered Medications  Medication Dose Route Frequency Provider Last Rate Last Dose  . acetaminophen (TYLENOL) tablet 650 mg  650 mg Oral Q6H PRN Ethelene Hal, NP   650 mg at 06/16/19 0618  . alum & mag hydroxide-simeth (MAALOX/MYLANTA) 200-200-20 MG/5ML suspension 30 mL  30 mL Oral Q4H PRN Ethelene Hal, NP      . aspirin EC tablet 81 mg  81 mg Oral Daily Ethelene Hal, NP   81 mg at 06/20/19 0732  . buPROPion (WELLBUTRIN XL) 24 hr tablet 150 mg  150 mg Oral Daily Sharma Covert, MD   150 mg at 06/20/19 0732  . calcium carbonate  (TUMS - dosed in mg elemental calcium) chewable tablet 400 mg of elemental calcium  2 tablet Oral Daily Sharma Covert, MD   400 mg of elemental calcium at 06/17/19 0758  . gabapentin (NEURONTIN) capsule 200 mg  200 mg Oral BID Sharma Covert, MD   200 mg at 06/20/19 0732  . HYDROcodone-acetaminophen (NORCO) 10-325 MG per tablet 1 tablet  1 tablet Oral Q6H PRN Sharma Covert, MD   1 tablet at 06/20/19 0734  . hydrOXYzine (ATARAX/VISTARIL) tablet 25 mg  25 mg Oral Q6H PRN Sharma Covert, MD      . hydrOXYzine (ATARAX/VISTARIL) tablet 50 mg  50 mg Oral QHS Derrill Center, NP  50 mg at 06/18/19 2152  . insulin aspart (novoLOG) injection 0-5 Units  0-5 Units Subcutaneous QHS Sharma Covert, MD   5 Units at 06/19/19 2135  . insulin aspart (novoLOG) injection 0-9 Units  0-9 Units Subcutaneous TID WC Sharma Covert, MD   5 Units at 06/20/19 1157  . insulin detemir (LEVEMIR) injection 34 Units  34 Units Subcutaneous BID Sharma Covert, MD   34 Units at 06/20/19 0754  . lisinopril (ZESTRIL) tablet 10 mg  10 mg Oral Daily Sharma Covert, MD   10 mg at 06/20/19 0732  . magnesium hydroxide (MILK OF MAGNESIA) suspension 30 mL  30 mL Oral Daily PRN Ethelene Hal, NP      . metoprolol tartrate (LOPRESSOR) tablet 25 mg  25 mg Oral BID Sharma Covert, MD   25 mg at 06/20/19 0732  . nitroGLYCERIN (NITROSTAT) SL tablet 0.4 mg  0.4 mg Sublingual Q5 min PRN Ethelene Hal, NP      . ondansetron [facility]) tablet 4 mg  4 mg Oral Q8H PRN Sharma Covert, MD      . pantoprazole (PROTONIX) EC tablet 40 mg  40 mg Oral Daily Sharma Covert, MD   40 mg at 06/20/19 6203  . rosuvastatin (CRESTOR) tablet 40 mg  40 mg Oral Daily Ethelene Hal, NP   40 mg at 06/20/19 5597    Lab Results:  Results for orders placed or performed during the hospital encounter of 06/15/19 (from the past 48 hour(s))  Glucose, capillary     Status: Abnormal   Collection Time: 06/18/19   4:45 PM  Result Value Ref Range   Glucose-Capillary 250 (H) 70 - 99 mg/dL  Glucose, capillary     Status: Abnormal   Collection Time: 06/18/19  8:36 PM  Result Value Ref Range   Glucose-Capillary 324 (H) 70 - 99 mg/dL   Comment 1 Notify RN    Comment 2 Document in Chart   Glucose, capillary     Status: Abnormal   Collection Time: 06/19/19  5:42 AM  Result Value Ref Range   Glucose-Capillary 267 (H) 70 - 99 mg/dL   Comment 1 Notify RN    Comment 2 Document in Chart   Glucose, capillary     Status: Abnormal   Collection Time: 06/19/19 12:01 PM  Result Value Ref Range   Glucose-Capillary 352 (H) 70 - 99 mg/dL   Comment 1 Notify RN   Glucose, capillary     Status: Abnormal   Collection Time: 06/19/19  5:01 PM  Result Value Ref Range   Glucose-Capillary 313 (H) 70 - 99 mg/dL  Glucose, capillary     Status: Abnormal   Collection Time: 06/19/19  8:27 PM  Result Value Ref Range   Glucose-Capillary 351 (H) 70 - 99 mg/dL   Comment 1 Notify RN    Comment 2 Document in Chart   Glucose, capillary     Status: Abnormal   Collection Time: 06/20/19  5:47 AM  Result Value Ref Range   Glucose-Capillary 136 (H) 70 - 99 mg/dL   Comment 1 Notify RN    Comment 2 Document in Chart   Glucose, capillary     Status: Abnormal   Collection Time: 06/20/19 11:55 AM  Result Value Ref Range   Glucose-Capillary 282 (H) 70 - 99 mg/dL   Comment 1 Notify RN    Comment 2 Document in Chart     Blood Alcohol level:  Lab  Results  Component Value Date   ETH <10 06/16/2019   ETH <10 09/26/2535    Metabolic Disorder Labs: Lab Results  Component Value Date   HGBA1C 9.6 (H) 06/16/2019   MPG 228.82 06/16/2019   MPG 240 01/14/2019   No results found for: PROLACTIN Lab Results  Component Value Date   CHOL 263 (H) 06/16/2019   TRIG 376 (H) 06/16/2019   HDL 37 (L) 06/16/2019   CHOLHDL 7.1 06/16/2019   VLDL 75 (H) 06/16/2019   LDLCALC 151 (H) 06/16/2019   LDLCALC 68 11/20/2018    Physical  Findings: AIMS: Facial and Oral Movements Muscles of Facial Expression: None, normal Lips and Perioral Area: None, normal Jaw: None, normal Tongue: None, normal,Extremity Movements Upper (arms, wrists, hands, fingers): None, normal Lower (legs, knees, ankles, toes): None, normal, Trunk Movements Neck, shoulders, hips: None, normal, Overall Severity Severity of abnormal movements (highest score from questions above): None, normal Incapacitation due to abnormal movements: None, normal Patient's awareness of abnormal movements (rate only patient's report): No Awareness, Dental Status Current problems with teeth and/or dentures?: No Does patient usually wear dentures?: No  CIWA:  CIWA-Ar Total: 0 COWS:  COWS Total Score: 0  Musculoskeletal: Strength & Muscle Tone: within normal limits Gait & Station: normal Patient leans: N/A  Psychiatric Specialty Exam: Physical Exam  Nursing note and vitals reviewed. Constitutional: He is oriented to person, place, and time. He appears well-developed and well-nourished.  HENT:  Head: Normocephalic and atraumatic.  Respiratory: Effort normal.  Neurological: He is alert and oriented to person, place, and time.    ROS no headache , no chest pain, no shortness of breath, no vomiting   Blood pressure 138/88, pulse 83, temperature 98 F (36.7 C), temperature source Oral, resp. rate 16, height 5' 7"  (1.702 m), weight 105.2 kg, SpO2 96 %.Body mass index is 36.34 kg/m.  General Appearance: Casual  Eye Contact:  Good  Speech:  Normal Rate  Volume:  Decreased  Mood:  partial improvement compared to admission, still depressed   Affect:  Congruent, vaguely anxious, smiles briefly at times during session  Thought Process:  Coherent and Descriptions of Associations: Intact  Orientation:  Full (Time, Place, and Person)  Thought Content:  no hallucinations,  no delusions   Suicidal Thoughts:  No currently no suicidal or self injurious ideations, no homicidal  or violent ideations, contracts for safety on unit   Homicidal Thoughts:  No  Memory:  recent and remote grossly intact   Judgement:  Fair/ improving   Insight:  Fair  Psychomotor Activity:  Normal  Concentration:  Concentration: Good and Attention Span: Good  Recall:  Good  Fund of Knowledge:  Good  Language:  Good  Akathisia:  Negative  Handed:  Right  AIMS (if indicated):     Assets:  Desire for Improvement Resilience  ADL's:  Intact  Cognition:  WNL  Sleep:  Number of Hours: 6.25   Assessment: 64 year old male , presented for worsening depression , suicidal ideations. Reported significant psychosocial stressors, including homelessness and separation from his wife/children who moved to [place].   Patient currently remains depressed, anxious, although does endorse some improvement compared to admission and staff reports he has been more interactive and  presents future oriented/ denies suicidal ideations. Currently ruminative about upcoming outpatient appointment for pain management that he does not want to miss due to concern that rescheduling an appointment may take weeks.  Denies medication side effects.     Treatment Plan Summary: Treatment  Plan reviewed as below today 7/21 Encourage group and milieu participation  1.  Continue Wellbutrin XL 150 mg p.o. daily for depression. 2.  Continue elemental calcium through Tums for nutritional supplementation and vitamin D. 3.  Continue Neurontin 200 mg p.o. twice daily for peripheral neuropathy and chronic pain. 4.  Continue hydroxyzine 50 mg p.o. nightly for insomnia. 5.  Continue hydroxyzine 25 mg p.o. every 6 hours as needed anxiety. 6.  Continue sliding scale insulin. 7.  Continue  Levemir 34 units subcu twice daily for diabetic control. 8.  Increase Lisinopril  10 mg p.o. daily for hypertension as well as kidney protection. 9.  Continue Metoprolol short acting to 25 mg p.o. twice daily for hypertension and coronary artery disease  protection. 10.  Continue nitroglycerin sublingual as needed chest pain. 11.  Continue Crestor 40 mg p.o. daily for hyperlipidemia. 12.  Continue Zofran 4 mg p.o. every 8 hours as needed nausea. 13.  Continue Protonix 40 mg p.o. daily for gastric protection. 14.  Continue hydrocodone 10/325 1 tablet p.o. every 6 hours as needed pain. 15.  Continue coated aspirin 81 mg p.o. daily for coronary artery disease. 16.  Disposition planning-in progress.  Jenne Campus, MD 06/20/2019, 1:44 PM   Patient ID: Nigel Berthold, male   DOB: September 21, 1956, 63 y.o.   MRN: [id number]

## 2019-06-20 NOTE — Progress Notes (Signed)
D: Pt denies SI/HI/AVH. Pt is pleasant and cooperative. Pt seen on the unit this evening, but appeared depressed and guarded. Pt forwards minimal information this evening.  A: Pt was offered support and encouragement.  Pt was encourage to attend groups. Q 15 minute checks were done for safety.  R: Pt is taking medication. Pt has no complaints.Pt receptive to treatment and safety maintained on unit.

## 2019-06-20 NOTE — Progress Notes (Signed)
Adult Psychoeducational Group Note  Date:  06/20/2019 Time:  9:42 PM  Group Topic/Focus:  Wrap-Up Group:   The focus of this group is to help patients review their daily goal of treatment and discuss progress on daily workbooks.  Participation Level:  Active  Participation Quality:  Appropriate  Affect:  Appropriate  Cognitive:  Alert  Insight: Appropriate  Engagement in Group:  Engaged  Modes of Intervention:  Discussion  Additional Comments:  Pt stated that his goal was to schedule an appointment with primary care doctor, pt was unable to do that. One new coping skill that the pt was learned is patience.  Wynelle Fanny R 06/20/2019, 9:42 PM

## 2019-06-21 ENCOUNTER — Other Ambulatory Visit: Payer: Self-pay

## 2019-06-21 ENCOUNTER — Encounter: Payer: Medicare HMO | Attending: Physical Medicine & Rehabilitation | Admitting: Physical Medicine & Rehabilitation

## 2019-06-21 ENCOUNTER — Encounter: Payer: Self-pay | Admitting: Physical Medicine & Rehabilitation

## 2019-06-21 ENCOUNTER — Inpatient Hospital Stay (HOSPITAL_COMMUNITY)
Admission: EM | Admit: 2019-06-21 | Discharge: 2019-06-24 | DRG: 918 | Disposition: A | Discharge status: Other Acute Inpatient Hospital | Payer: Medicare HMO | Attending: Internal Medicine | Admitting: Internal Medicine

## 2019-06-21 VITALS — BP 125/72 | HR 80 | Temp 98.1°F | Ht 69.0 in | Wt 231.0 lb

## 2019-06-21 DIAGNOSIS — Z820 Family history of epilepsy and other diseases of the nervous system: Secondary | ICD-10-CM | POA: Diagnosis not present

## 2019-06-21 DIAGNOSIS — I2510 Atherosclerotic heart disease of native coronary artery without angina pectoris: Secondary | ICD-10-CM | POA: Diagnosis not present

## 2019-06-21 DIAGNOSIS — Z8249 Family history of ischemic heart disease and other diseases of the circulatory system: Secondary | ICD-10-CM

## 2019-06-21 DIAGNOSIS — E162 Hypoglycemia, unspecified: Secondary | ICD-10-CM | POA: Diagnosis not present

## 2019-06-21 DIAGNOSIS — E785 Hyperlipidemia, unspecified: Secondary | ICD-10-CM | POA: Diagnosis present

## 2019-06-21 DIAGNOSIS — Z5181 Encounter for therapeutic drug level monitoring: Secondary | ICD-10-CM

## 2019-06-21 DIAGNOSIS — Z79891 Long term (current) use of opiate analgesic: Secondary | ICD-10-CM

## 2019-06-21 DIAGNOSIS — Z590 Homelessness: Secondary | ICD-10-CM

## 2019-06-21 DIAGNOSIS — T383X5A Adverse effect of insulin and oral hypoglycemic [antidiabetic] drugs, initial encounter: Secondary | ICD-10-CM | POA: Diagnosis not present

## 2019-06-21 DIAGNOSIS — E160 Drug-induced hypoglycemia without coma: Secondary | ICD-10-CM | POA: Diagnosis not present

## 2019-06-21 DIAGNOSIS — E782 Mixed hyperlipidemia: Secondary | ICD-10-CM | POA: Diagnosis not present

## 2019-06-21 DIAGNOSIS — M7989 Other specified soft tissue disorders: Secondary | ICD-10-CM | POA: Insufficient documentation

## 2019-06-21 DIAGNOSIS — Z915 Personal history of self-harm: Secondary | ICD-10-CM | POA: Diagnosis not present

## 2019-06-21 DIAGNOSIS — R69 Illness, unspecified: Secondary | ICD-10-CM | POA: Diagnosis not present

## 2019-06-21 DIAGNOSIS — G2581 Restless legs syndrome: Secondary | ICD-10-CM | POA: Diagnosis present

## 2019-06-21 DIAGNOSIS — Z955 Presence of coronary angioplasty implant and graft: Secondary | ICD-10-CM

## 2019-06-21 DIAGNOSIS — Z79899 Other long term (current) drug therapy: Secondary | ICD-10-CM

## 2019-06-21 DIAGNOSIS — IMO0002 Reserved for concepts with insufficient information to code with codable children: Secondary | ICD-10-CM | POA: Diagnosis present

## 2019-06-21 DIAGNOSIS — G894 Chronic pain syndrome: Secondary | ICD-10-CM | POA: Insufficient documentation

## 2019-06-21 DIAGNOSIS — Z560 Unemployment, unspecified: Secondary | ICD-10-CM

## 2019-06-21 DIAGNOSIS — Z62810 Personal history of physical and sexual abuse in childhood: Secondary | ICD-10-CM | POA: Diagnosis present

## 2019-06-21 DIAGNOSIS — T50902A Poisoning by unspecified drugs, medicaments and biological substances, intentional self-harm, initial encounter: Secondary | ICD-10-CM | POA: Diagnosis present

## 2019-06-21 DIAGNOSIS — G63 Polyneuropathy in diseases classified elsewhere: Secondary | ICD-10-CM

## 2019-06-21 DIAGNOSIS — M25551 Pain in right hip: Secondary | ICD-10-CM | POA: Insufficient documentation

## 2019-06-21 DIAGNOSIS — I10 Essential (primary) hypertension: Secondary | ICD-10-CM | POA: Diagnosis present

## 2019-06-21 DIAGNOSIS — Z833 Family history of diabetes mellitus: Secondary | ICD-10-CM

## 2019-06-21 DIAGNOSIS — K219 Gastro-esophageal reflux disease without esophagitis: Secondary | ICD-10-CM | POA: Diagnosis not present

## 2019-06-21 DIAGNOSIS — K2270 Barrett's esophagus without dysplasia: Secondary | ICD-10-CM | POA: Diagnosis present

## 2019-06-21 DIAGNOSIS — T50992A Poisoning by other drugs, medicaments and biological substances, intentional self-harm, initial encounter: Principal | ICD-10-CM | POA: Diagnosis present

## 2019-06-21 DIAGNOSIS — E11649 Type 2 diabetes mellitus with hypoglycemia without coma: Secondary | ICD-10-CM | POA: Diagnosis present

## 2019-06-21 DIAGNOSIS — E1165 Type 2 diabetes mellitus with hyperglycemia: Secondary | ICD-10-CM | POA: Diagnosis present

## 2019-06-21 DIAGNOSIS — Z03818 Encounter for observation for suspected exposure to other biological agents ruled out: Secondary | ICD-10-CM | POA: Diagnosis not present

## 2019-06-21 DIAGNOSIS — F332 Major depressive disorder, recurrent severe without psychotic features: Secondary | ICD-10-CM | POA: Diagnosis present

## 2019-06-21 DIAGNOSIS — Z87891 Personal history of nicotine dependence: Secondary | ICD-10-CM | POA: Diagnosis not present

## 2019-06-21 DIAGNOSIS — Z20828 Contact with and (suspected) exposure to other viral communicable diseases: Secondary | ICD-10-CM | POA: Diagnosis not present

## 2019-06-21 DIAGNOSIS — F329 Major depressive disorder, single episode, unspecified: Secondary | ICD-10-CM | POA: Diagnosis present

## 2019-06-21 DIAGNOSIS — I252 Old myocardial infarction: Secondary | ICD-10-CM | POA: Diagnosis not present

## 2019-06-21 DIAGNOSIS — Z7982 Long term (current) use of aspirin: Secondary | ICD-10-CM | POA: Diagnosis not present

## 2019-06-21 DIAGNOSIS — M799 Soft tissue disorder, unspecified: Secondary | ICD-10-CM | POA: Insufficient documentation

## 2019-06-21 DIAGNOSIS — Z62811 Personal history of psychological abuse in childhood: Secondary | ICD-10-CM | POA: Diagnosis not present

## 2019-06-21 DIAGNOSIS — E1142 Type 2 diabetes mellitus with diabetic polyneuropathy: Secondary | ICD-10-CM | POA: Diagnosis present

## 2019-06-21 DIAGNOSIS — Z794 Long term (current) use of insulin: Secondary | ICD-10-CM

## 2019-06-21 DIAGNOSIS — E119 Type 2 diabetes mellitus without complications: Secondary | ICD-10-CM | POA: Insufficient documentation

## 2019-06-21 DIAGNOSIS — M25552 Pain in left hip: Secondary | ICD-10-CM | POA: Insufficient documentation

## 2019-06-21 DIAGNOSIS — F339 Major depressive disorder, recurrent, unspecified: Secondary | ICD-10-CM | POA: Diagnosis not present

## 2019-06-21 DIAGNOSIS — F32A Depression, unspecified: Secondary | ICD-10-CM | POA: Diagnosis present

## 2019-06-21 LAB — COMPREHENSIVE METABOLIC PANEL
ALT: 25 U/L (ref 0–44)
AST: 27 U/L (ref 15–41)
Albumin: 5 g/dL (ref 3.5–5.0)
Alkaline Phosphatase: 162 U/L — ABNORMAL HIGH (ref 38–126)
Anion gap: 11 (ref 5–15)
BUN: 15 mg/dL (ref 8–23)
CO2: 24 mmol/L (ref 22–32)
Calcium: 9.9 mg/dL (ref 8.9–10.3)
Chloride: 105 mmol/L (ref 98–111)
Creatinine, Ser: 1.11 mg/dL (ref 0.61–1.24)
GFR calc Af Amer: >60 mL/min (ref >60)
GFR calc non Af Amer: >60 mL/min (ref >60)
Glucose, Bld: 84 mg/dL (ref 70–99)
Potassium: 3.5 mmol/L (ref 3.5–5.1)
Sodium: 140 mmol/L (ref 135–145)
Total Bilirubin: 0.8 mg/dL (ref 0.3–1.2)
Total Protein: 8.8 g/dL — ABNORMAL HIGH (ref 6.5–8.1)

## 2019-06-21 LAB — LACTIC ACID, PLASMA
Lactic Acid, Venous: 2.4 mmol/L (ref 0.5–1.9)
Lactic Acid, Venous: 1.2 mmol/L (ref 0.5–1.9)

## 2019-06-21 LAB — MAGNESIUM: Magnesium: 2.1 mg/dL (ref 1.7–2.4)

## 2019-06-21 LAB — CBG MONITORING, ED
Glucose-Capillary: 33 mg/dL — CL (ref 70–99)
Glucose-Capillary: 77 mg/dL (ref 70–99)
Glucose-Capillary: 212 mg/dL — ABNORMAL HIGH (ref 70–99)
Glucose-Capillary: 52 mg/dL — ABNORMAL LOW (ref 70–99)
Glucose-Capillary: 54 mg/dL — ABNORMAL LOW (ref 70–99)

## 2019-06-21 LAB — CBC
HCT: 47.5 % (ref 39.0–52.0)
Hemoglobin: 15.1 g/dL (ref 13.0–17.0)
MCH: 27.8 pg (ref 26.0–34.0)
MCHC: 31.8 g/dL (ref 30.0–36.0)
MCV: 87.5 fL (ref 80.0–100.0)
Platelets: 408 10*3/uL — ABNORMAL HIGH (ref 150–400)
RBC: 5.43 MIL/uL (ref 4.22–5.81)
RDW: 13.4 % (ref 11.5–15.5)
WBC: 6.1 10*3/uL (ref 4.0–10.5)
nRBC: 0 % (ref 0.0–0.2)

## 2019-06-21 LAB — PHOSPHORUS: Phosphorus: 2.2 mg/dL — ABNORMAL LOW (ref 2.5–4.6)

## 2019-06-21 LAB — ETHANOL: Alcohol, Ethyl (B): <10 mg/dL (ref <10)

## 2019-06-21 LAB — GLUCOSE, CAPILLARY
Glucose-Capillary: 100 mg/dL — ABNORMAL HIGH (ref 70–99)
Glucose-Capillary: 122 mg/dL — ABNORMAL HIGH (ref 70–99)
Glucose-Capillary: 74 mg/dL (ref 70–99)

## 2019-06-21 LAB — SARS CORONAVIRUS 2 BY RT PCR (HOSPITAL ORDER, PERFORMED IN ~~LOC~~ HOSPITAL LAB): SARS Coronavirus 2: NEGATIVE

## 2019-06-21 LAB — SALICYLATE LEVEL: Salicylate Lvl: <7 mg/dL (ref 2.8–30.0)

## 2019-06-21 LAB — ACETAMINOPHEN LEVEL: Acetaminophen (Tylenol), Serum: <10 ug/mL — ABNORMAL LOW (ref 10–30)

## 2019-06-21 MED ORDER — METHYLPREDNISOLONE SODIUM SUCC 40 MG IJ SOLR
40.0000 mg | Freq: Once | INTRAMUSCULAR | Status: AC
  Administered 2019-06-21: 40 mg via INTRAVENOUS
  Filled 2019-06-21: qty 1

## 2019-06-21 MED ORDER — BUPROPION HCL ER (XL) 150 MG PO TB24: 150.0000 mg | ORAL_TABLET | Freq: Every day | ORAL | 0 refills | Status: DC

## 2019-06-21 MED ORDER — DEXTROSE 50 % IV SOLN
INTRAVENOUS | Status: AC
  Administered 2019-06-21: 50 mL
  Filled 2019-06-21: qty 50

## 2019-06-21 MED ORDER — LISINOPRIL 10 MG PO TABS
10.0000 mg | ORAL_TABLET | Freq: Every day | ORAL | Status: DC
  Administered 2019-06-22 – 2019-06-24 (×3): 10 mg via ORAL
  Filled 2019-06-21 (×3): qty 1

## 2019-06-21 MED ORDER — INSULIN DETEMIR 100 UNIT/ML ~~LOC~~ SOLN
30.0000 [IU] | Freq: Two times a day (BID) | SUBCUTANEOUS | Status: DC
  Filled 2019-06-21: qty 0.3

## 2019-06-21 MED ORDER — GLUCAGON HCL RDNA (DIAGNOSTIC) 1 MG IJ SOLR
1.0000 mg | Freq: Once | INTRAMUSCULAR | Status: AC
  Administered 2019-06-21: 1 mg via INTRAVENOUS
  Filled 2019-06-21: qty 1

## 2019-06-21 MED ORDER — PANTOPRAZOLE SODIUM 40 MG IV SOLR
40.0000 mg | Freq: Two times a day (BID) | INTRAVENOUS | Status: DC
  Administered 2019-06-21: 40 mg via INTRAVENOUS
  Filled 2019-06-21: qty 40

## 2019-06-21 MED ORDER — INSULIN DETEMIR 100 UNIT/ML ~~LOC~~ SOLN: 34.0000 [IU] | Freq: Two times a day (BID) | SUBCUTANEOUS | Status: DC

## 2019-06-21 MED ORDER — GABAPENTIN 100 MG PO CAPS: 200.0000 mg | ORAL_CAPSULE | Freq: Two times a day (BID) | ORAL | 0 refills | Status: DC

## 2019-06-21 MED ORDER — ONDANSETRON HCL 4 MG/2ML IJ SOLN
4.0000 mg | Freq: Four times a day (QID) | INTRAMUSCULAR | Status: DC | PRN
  Administered 2019-06-23: 4 mg via INTRAVENOUS
  Filled 2019-06-21: qty 2

## 2019-06-21 MED ORDER — K PHOS MONO-SOD PHOS DI & MONO 155-852-130 MG PO TABS
500.0000 mg | ORAL_TABLET | Freq: Two times a day (BID) | ORAL | Status: AC
  Administered 2019-06-21 – 2019-06-22 (×3): 500 mg via ORAL
  Filled 2019-06-21 (×3): qty 2

## 2019-06-21 MED ORDER — SODIUM CHLORIDE 4 MEQ/ML IV SOLN
INTRAVENOUS | Status: DC
  Administered 2019-06-21 – 2019-06-22 (×2): via INTRAVENOUS
  Filled 2019-06-21 (×4): qty 1000

## 2019-06-21 MED ORDER — DEXTROSE 50 % IV SOLN
50.0000 mL | Freq: Once | INTRAVENOUS | Status: AC
  Administered 2019-06-21: 50 mL via INTRAVENOUS
  Filled 2019-06-21: qty 50

## 2019-06-21 MED ORDER — SODIUM CHLORIDE 0.9 % IV BOLUS
1000.0000 mL | Freq: Once | INTRAVENOUS | Status: AC
  Administered 2019-06-21: 1000 mL via INTRAVENOUS

## 2019-06-21 MED ORDER — SODIUM CHLORIDE 0.9 % IV SOLN: 250.0000 mL | INTRAVENOUS | Status: DC | PRN

## 2019-06-21 MED ORDER — SODIUM CHLORIDE 0.9% FLUSH
3.0000 mL | Freq: Two times a day (BID) | INTRAVENOUS | Status: DC
  Administered 2019-06-21 – 2019-06-23 (×5): 3 mL via INTRAVENOUS

## 2019-06-21 MED ORDER — ROSUVASTATIN CALCIUM 20 MG PO TABS
40.0000 mg | ORAL_TABLET | Freq: Every day | ORAL | Status: DC
  Administered 2019-06-22 – 2019-06-24 (×3): 40 mg via ORAL
  Filled 2019-06-21 (×3): qty 2

## 2019-06-21 MED ORDER — DEXTROSE 10 % IV SOLN
INTRAVENOUS | Status: DC
  Administered 2019-06-21: 19:00:00 via INTRAVENOUS
  Filled 2019-06-21: qty 1000

## 2019-06-21 MED ORDER — GABAPENTIN 100 MG PO CAPS
200.0000 mg | ORAL_CAPSULE | Freq: Two times a day (BID) | ORAL | Status: DC
  Administered 2019-06-21 – 2019-06-24 (×6): 200 mg via ORAL
  Filled 2019-06-21 (×6): qty 2

## 2019-06-21 MED ORDER — NALOXONE HCL 0.4 MG/ML IJ SOLN
0.4000 mg | Freq: Once | INTRAMUSCULAR | Status: AC
  Administered 2019-06-21: 19:00:00 0.4 mg via INTRAVENOUS
  Filled 2019-06-21: qty 1

## 2019-06-21 MED ORDER — LISINOPRIL 10 MG PO TABS: 10.0000 mg | ORAL_TABLET | Freq: Every day | ORAL | 0 refills | Status: DC

## 2019-06-21 MED ORDER — NITROGLYCERIN 0.4 MG SL SUBL: 0.4000 mg | SUBLINGUAL_TABLET | SUBLINGUAL | Status: DC | PRN

## 2019-06-21 MED ORDER — METOPROLOL TARTRATE 25 MG PO TABS: 25.0000 mg | ORAL_TABLET | Freq: Two times a day (BID) | ORAL | 0 refills | Status: DC

## 2019-06-21 MED ORDER — HYDROCODONE-ACETAMINOPHEN 7.5-325 MG PO TABS
1.0000 | ORAL_TABLET | Freq: Four times a day (QID) | ORAL | Status: DC | PRN
  Administered 2019-06-22 – 2019-06-24 (×7): 1 via ORAL
  Filled 2019-06-21 (×7): qty 1

## 2019-06-21 MED ORDER — ASPIRIN EC 81 MG PO TBEC
81.0000 mg | DELAYED_RELEASE_TABLET | Freq: Every day | ORAL | Status: DC
  Administered 2019-06-22 – 2019-06-24 (×3): 81 mg via ORAL
  Filled 2019-06-21 (×3): qty 1

## 2019-06-21 MED ORDER — BUPROPION HCL ER (XL) 150 MG PO TB24
150.0000 mg | ORAL_TABLET | Freq: Every day | ORAL | Status: DC
  Administered 2019-06-22 – 2019-06-24 (×3): 150 mg via ORAL
  Filled 2019-06-21 (×3): qty 1

## 2019-06-21 MED ORDER — PANTOPRAZOLE SODIUM 40 MG PO TBEC
40.0000 mg | DELAYED_RELEASE_TABLET | Freq: Every day | ORAL | Status: DC
  Administered 2019-06-22 – 2019-06-24 (×3): 40 mg via ORAL
  Filled 2019-06-21 (×3): qty 1

## 2019-06-21 MED ORDER — HYDROCODONE-ACETAMINOPHEN 7.5-325 MG PO TABS: 1.0000 | ORAL_TABLET | Freq: Four times a day (QID) | ORAL | 0 refills | Status: DC | PRN

## 2019-06-21 MED ORDER — HYDROMORPHONE HCL 1 MG/ML IJ SOLN
1.0000 mg | Freq: Once | INTRAMUSCULAR | Status: AC
  Administered 2019-06-21: 1 mg via INTRAVENOUS
  Filled 2019-06-21: qty 1

## 2019-06-21 MED ORDER — SODIUM CHLORIDE 0.9% FLUSH: 3.0000 mL | INTRAVENOUS | Status: DC | PRN

## 2019-06-21 MED ORDER — CALCIUM CARBONATE ANTACID 500 MG PO CHEW: 3.0000 | CHEWABLE_TABLET | Freq: Every day | ORAL | Status: DC | PRN

## 2019-06-21 MED ORDER — ONDANSETRON HCL 4 MG PO TABS: 4.0000 mg | ORAL_TABLET | Freq: Four times a day (QID) | ORAL | Status: DC | PRN

## 2019-06-21 MED ORDER — ENOXAPARIN SODIUM 40 MG/0.4ML ~~LOC~~ SOLN
40.0000 mg | Freq: Every day | SUBCUTANEOUS | Status: DC
  Administered 2019-06-21 – 2019-06-23 (×3): 40 mg via SUBCUTANEOUS
  Filled 2019-06-21 (×3): qty 0.4

## 2019-06-21 NOTE — Patient Instructions (Signed)
PLEASE FEEL FREE TO CALL OUR OFFICE WITH ANY PROBLEMS OR QUESTIONS ([phone])      

## 2019-06-21 NOTE — ED Notes (Signed)
Provider made aware of CBG of 54. Verbal order to give another amp of Dextrose 50%. Will continue to monitor patient.

## 2019-06-21 NOTE — ED Notes (Signed)
Poison Control Recommendations-  24 Hour observation with continuous cardiac monitoring Monitor for Seizures and Hypoglycemia, Bradycardia and Hypotension.

## 2019-06-21 NOTE — ED Notes (Signed)
Patient denies SI or HI at this time, but states he was feeling suicidal earlier when he took the medication. Will place order for sitter due to circumstances and continue to monitor patient.

## 2019-06-21 NOTE — ED Notes (Addendum)
Provider made aware of CBG. Will continue to monitor patient.

## 2019-06-21 NOTE — ED Triage Notes (Signed)
Pt reports that he took a whole bottle of meloxicam and metoprolol. Pt reports he is suicidal. Pt is lethargic during triage and diaphoretic. Unable to ascertain more information due to patient's status.

## 2019-06-21 NOTE — ED Notes (Signed)
Verbal order to administer dextrose 50% push and increase dextrose infusion from 75 mL/hr to 125 mL/hr. Will continue to monitor patient.

## 2019-06-21 NOTE — Progress Notes (Signed)
Patient was provided housing resources throughout his inpatient hospitalization.CSW has contacted multiple homeless shelters in the respective area, however there has been no bed availability thus far. CSW presented the patient with information on [employer], however he reports that [employer] was too far.   Patient reports he would keep their contact information in case he cannot find housing elsewhere in the [place] area.   Patient was agreeable with discharging today to go to his pain management appointment. Patient expressed gratitude and states that he will remain hopeful for potential housing.   CSW will continue to follow for a safe discharge.    Radonna Ricker, MSW, [place] Worker [facility]  Phone: [phone]

## 2019-06-21 NOTE — ED Provider Notes (Signed)
[facility]-EMERGENCY DEPT Provider Note   CSN: [id number] Arrival date & time: 06/21/19  1826  LEVEL 5 CAVEAT - [facility]  History   Chief Complaint Chief Complaint  Patient presents with  . Suicidal  . Drug Overdose    HPI [name] [name] is a 63 y.o. male.     HPI  63 year old male presents with drug overdose.  History is limited as the patient is lethargic and speaks very slowly and sometimes just does not answer.  Was released from behavioral health this morning.  At some point today he took an overdose of medicines, which he states includes metoprolol, insulin, and it sounds like he is trying to say methocarbamol.  He states he is having chronic pain but no new pain.  Further history is very limited and he does not know what time he took all these medicines.  Past Medical History:  Diagnosis Date  . Barrett's esophagus   . CAD (coronary artery disease)    a. NSTEMI 05/2014 - occluded RCA dominant proximal s/p asp-thrombectomy/DES to RCA, minimal LAD/LCx, EF 50% by cath, 65-70% by echo.  . Depression   . Diabetes type 2, uncontrolled (HCC)   . Former tobacco use   . GERD (gastroesophageal reflux disease)   . Hemorrhoids    hx of  . Hypercholesterolemia   . Hypertension   . MI (myocardial infarction) (HCC)   . Peripheral neuropathy     Patient Active Problem List   Diagnosis Date Noted  . Hypoglycemia 06/21/2019  . MDD (major depressive disorder), recurrent severe, without psychosis (HCC) 06/15/2019  . Cerebral thrombosis with cerebral infarction 01/15/2019  . Subarachnoid hemorrhage 01/15/2019  . Stroke-like symptoms 01/14/2019  . Hypoglycemia due to insulin 01/14/2019  . Noncompliance with medications   . Homeless   . DKA (diabetic ketoacidosis) (HCC) 11/27/2018  . Chest pain of uncertain etiology 11/19/2018  . Dizziness 11/19/2018  . Bilateral low back pain without sciatica 04/12/2018  . Vitamin D deficiency 04/07/2018  . Pill dysphagia  04/07/2018  . Enlarged thyroid gland 04/07/2018  . Labile blood glucose 04/07/2018  . Brittle diabetes mellitus (HCC) 04/07/2018  . AKI (acute kidney injury) (HCC) 09/22/2017  . Chronic pain syndrome 08/18/2016  . Diabetic ketoacidosis (HCC) 04/12/2016  . Hip pain, bilateral 08/23/2015  . Chronic neck pain 08/23/2015  . DKA (diabetic ketoacidoses) (HCC) 05/30/2015  . Acute renal failure (HCC) 05/30/2015  . Restless legs 08/31/2014  . Uncontrolled diabetes mellitus type 2 with peripheral artery disease (HCC) 08/10/2014  . Old myocardial infarction 08/10/2014  . CAD (coronary artery disease) 06/18/2014  . Hyperlipidemia 06/18/2014  . NSTEMI (non-ST elevated myocardial infarction) (HCC) 06/14/2014  . N&V (nausea and vomiting) 07/21/2013  . Chronic pain in left shoulder 04/06/2013  . Peripheral neuropathy 02/28/2012  . Depression 12/25/2011  . GERD (gastroesophageal reflux disease) 12/25/2011  . DM (diabetes mellitus), type 2, uncontrolled (HCC) 11/18/2011    Past Surgical History:  Procedure Laterality Date  . LEFT HEART CATHETERIZATION WITH CORONARY ANGIOGRAM N/A 06/14/2014   Procedure: LEFT HEART CATHETERIZATION WITH CORONARY ANGIOGRAM;  Surgeon: Corky CraftsJayadeep S Varanasi, MD;  [facility];  Service: Cardiovascular;  Laterality: N/A;  . TONSILLECTOMY          Home Medications    Prior to Admission medications   Medication Sig Start Date End Date Taking? Authorizing Provider  buPROPion (WELLBUTRIN XL) 150 MG 24 hr tablet Take 1 tablet (150 mg total) by mouth daily. 06/22/19  Yes Marciano SequinSykes, Janet  E, NP  calcium carbonate (TUMS EX) 750 MG chewable tablet Chew 2 tablets by mouth daily as needed for heartburn.    Yes [provider]  EQ ASPIRIN ADULT LOW DOSE 81 MG EC tablet TAKE ONE TABLET BY MOUTH ONCE DAILY Patient taking differently: Take 81 mg by mouth daily.  07/02/15  Yes Corky Crafts, MD  gabapentin (NEURONTIN) 100 MG capsule Take 2 capsules (200 mg total) by  mouth 2 (two) times daily. 06/21/19  Yes Aldean Baker, NP  HYDROcodone-acetaminophen (NORCO) 7.5-325 MG tablet Take 1 tablet by mouth every 6 (six) hours as needed for moderate pain. 06/21/19  Yes Ranelle Oyster, MD  insulin aspart (NOVOLOG) 100 UNIT/ML injection Inject 0-15 Units into the skin 3 (three) times daily before meals. 121-150=2 units, 151-200=4 units, 201-250=7 units, 251-300=9 units, 301-350=12 units, >351=15 units   Yes [provider]  insulin detemir (LEVEMIR) 100 UNIT/ML injection Inject 30 Units into the skin 2 (two) times daily.   Yes [provider]  lisinopril (ZESTRIL) 10 MG tablet Take 1 tablet (10 mg total) by mouth daily. 06/22/19  Yes Aldean Baker, NP  metoprolol tartrate (LOPRESSOR) 25 MG tablet Take 1 tablet (25 mg total) by mouth 2 (two) times daily. 06/21/19  Yes Aldean Baker, NP  pantoprazole (PROTONIX) 40 MG tablet Take 1 tablet (40 mg total) by mouth daily. 05/17/19  Yes Ward, Chase Picket, PA-C  rosuvastatin (CRESTOR) 40 MG tablet TAKE 1 TABLET(40 MG) BY MOUTH DAILY Patient taking differently: Take 40 mg by mouth daily.  12/29/18  Yes Sherren Mocha, MD  insulin aspart (NOVOLOG) 100 UNIT/ML injection Inject 0-15 Units into the skin 3 (three) times daily with meals for 30 days. For glucose 121 to 150 use 2 units, for 151 to 200 use 4 units, for 201-250 use 7 units, for 251 to 300 use 9 units, for 301 to 350 use 12 units, for 351 or greater use 15 units. 05/05/19 06/04/19  Arrien, York Ram, MD  insulin detemir (LEVEMIR) 100 UNIT/ML injection Inject 0.3 mLs (30 Units total) into the skin 2 (two) times daily for 30 days. 05/05/19 06/04/19  Arrien, York Ram, MD  nitroGLYCERIN (NITROSTAT) 0.4 MG SL tablet Place 1 tablet (0.4 mg total) under the tongue every 5 (five) minutes as needed for chest pain (up to 3 doses). 11/10/16   Corky Crafts, MD    Family History Family History  Problem Relation Age of Onset  . Hypertension Mother   .  Alzheimer's disease Mother   . Alcohol abuse Father   . Cancer Father 33       "Throat"  . Diabetes type II Brother   . Cancer Brother 68       throat cancer  . Heart disease Neg Hx     Social History Social History   Tobacco Use  . Smoking status: Former Smoker    Packs/day: 0.25    Years: 3.00    Pack years: 0.75    Quit date: 11/30/1998    Years since quitting: 20.5  . Smokeless tobacco: Never Used  Substance Use Topics  . Alcohol use: No    Alcohol/week: 1.0 standard drinks    Types: 1 Standard drinks or equivalent per week  . Drug use: No     Allergies   Patient has no known allergies.   Review of Systems Review of Systems  Unable to perform ROS: Mental status change     Physical Exam Updated  Vital Signs BP 120/64   Pulse 65   Temp 98.1 F (36.7 C) (Oral)   Resp 16   Ht 5\' 9"  (1.753 m)   Wt 104.8 kg   SpO2 96%   BMI 34.11 kg/m   Physical Exam Vitals signs and nursing note reviewed.  Constitutional:      General: He is not in acute distress.    Appearance: He is well-developed. He is not ill-appearing or diaphoretic.  HENT:     Head: Normocephalic and atraumatic.     Right Ear: External ear normal.     Left Ear: External ear normal.     Nose: Nose normal.  Eyes:     General:        Right eye: No discharge.        Left eye: No discharge.  Neck:     Musculoskeletal: Neck supple.  Cardiovascular:     Rate and Rhythm: Normal rate and regular rhythm.     Heart sounds: Normal heart sounds.  Pulmonary:     Effort: Pulmonary effort is normal.     Breath sounds: Normal breath sounds.  Abdominal:     Palpations: Abdomen is soft.     Tenderness: There is no abdominal tenderness.  Skin:    General: Skin is warm and dry.  Neurological:     Mental Status: He is lethargic.     Comments: Oriented to person, place and month.  Psychiatric:        Mood and Affect: Mood is not anxious.      ED Treatments / Results  Labs (all labs ordered are  listed, but only abnormal results are displayed) Labs Reviewed  COMPREHENSIVE METABOLIC PANEL - Abnormal; Notable for the following components:      Result Value   Total Protein 8.8 (*)    Alkaline Phosphatase 162 (*)    All other components within normal limits  ACETAMINOPHEN LEVEL - Abnormal; Notable for the following components:   Acetaminophen (Tylenol), Serum <10 (*)    All other components within normal limits  CBC - Abnormal; Notable for the following components:   Platelets 408 (*)    All other components within normal limits  LACTIC ACID, PLASMA - Abnormal; Notable for the following components:   Lactic Acid, Venous 2.4 (*)    All other components within normal limits  PHOSPHORUS - Abnormal; Notable for the following components:   Phosphorus 2.2 (*)    All other components within normal limits  CBG MONITORING, ED - Abnormal; Notable for the following components:   Glucose-Capillary 33 (*)    All other components within normal limits  CBG MONITORING, ED - Abnormal; Notable for the following components:   Glucose-Capillary 212 (*)    All other components within normal limits  CBG MONITORING, ED - Abnormal; Notable for the following components:   Glucose-Capillary 52 (*)    All other components within normal limits  CBG MONITORING, ED - Abnormal; Notable for the following components:   Glucose-Capillary 54 (*)    All other components within normal limits  SARS CORONAVIRUS 2 (HOSPITAL ORDER, [facility])  ETHANOL  SALICYLATE LEVEL  LACTIC ACID, PLASMA  MAGNESIUM  RAPID URINE DRUG SCREEN, HOSP PERFORMED  COMPREHENSIVE METABOLIC PANEL  CBC  CBG MONITORING, ED  CBG MONITORING, ED  CBG MONITORING, ED    EKG EKG Interpretation  Date/Time:  Wednesday June 21 2019 18:45:03 EDT Ventricular Rate:  70 PR Interval:    QRS  Duration: 76 QT Interval:  337 QTC Calculation: 364 R Axis:   16 Text Interpretation:  Sinus rhythm Borderline ST  elevation, anterior leads no significant change since June 2020 Confirmed by Pricilla Loveless [phone]) on 06/21/2019 7:23:46 PM   Radiology No results found.  Procedures .Critical Care Performed by: Pricilla Loveless, MD Authorized by: Pricilla Loveless, MD   Critical care provider statement:    Critical care time (minutes):  35   Critical care time was exclusive of:  Separately billable procedures and treating other patients   Critical care was necessary to treat or prevent imminent or life-threatening deterioration of the following conditions:  Toxidrome and shock   Critical care was time spent personally by me on the following activities:  Discussions with consultants, evaluation of patient's response to treatment, examination of patient, ordering and performing treatments and interventions, ordering and review of laboratory studies, ordering and review of radiographic studies, pulse oximetry, re-evaluation of patient's condition, obtaining history from patient or surrogate and review of old charts   (including critical care time)  Medications Ordered in ED Medications  sodium chloride flush (NS) 0.9 % injection 3 mL ( Intravenous Canceled Entry 06/21/19 1855)  sodium chloride flush (NS) 0.9 % injection 3 mL (has no administration in time range)  0.9 %  sodium chloride infusion (has no administration in time range)  pantoprazole (PROTONIX) injection 40 mg (40 mg Intravenous Given 06/21/19 2115)  dextrose 10 % 1,000 mL with sodium chloride 0.45 %, potassium chloride 40 mEq/L infusion ( Intravenous New Bag/Given 06/21/19 2219)  enoxaparin (LOVENOX) injection 40 mg (has no administration in time range)  ondansetron (ZOFRAN) tablet 4 mg (has no administration in time range)    Or  ondansetron (ZOFRAN) injection 4 mg (has no administration in time range)  naloxone [facility]) injection 0.4 mg (0.4 mg Intravenous Given 06/21/19 1904)  sodium chloride 0.9 % bolus 1,000 mL (0 mLs Intravenous Stopped  06/21/19 2016)  dextrose 50 % solution 50 mL (50 mLs Intravenous Given 06/21/19 1931)  glucagon (human recombinant) (GLUCAGEN) injection 1 mg (1 mg Intravenous Given 06/21/19 1931)  dextrose 50 % solution (50 mLs  Given 06/21/19 2115)     Initial Impression / Assessment and Plan / ED Course  I have reviewed the triage vital signs and the nursing notes.  Pertinent labs & imaging results that were available during my care of the patient were reviewed by me and considered in my medical decision making (see chart for details).        Patient initially was lethargic and hard to follow.  Narcan did not seem to do much.  His initial glucose seem to be okay although he is noted to usually be hyperglycemic.  Glucose checked about 30 minutes later was 33 and after dextrose his mental status has significantly improved.  He remains with good mental status.  He does not appear to need immediate airway management.  Has good IV access.  He will need prolonged monitoring given the hypoglycemia and potential for long-term insulin overdose.  He reports metoprolol overdose but no hypotension or bradycardia.  Dr. Robb Matar to admit.  [name] [name] was evaluated in Emergency Department on 06/21/2019 for the symptoms described in the history of present illness. He was evaluated in the context of the global COVID-19 pandemic, which necessitated consideration that the patient might be at risk for infection with the SARS-CoV-2 virus that causes COVID-19. Institutional protocols and algorithms that pertain to the evaluation of patients at risk for  COVID-19 are in a state of rapid change based on information released by regulatory bodies including the CDC and federal and state organizations. These policies and algorithms were followed during the patient's care in the ED.   Final Clinical Impressions(s) / ED Diagnoses   Final diagnoses:  Hypoglycemia  Intentional drug overdose, initial encounter [facility](HCC)    ED Discharge Orders     None       Pricilla LovelessGoldston, Haruki Arnold, MD 06/21/19 2223

## 2019-06-21 NOTE — [facility] Suicide Risk Assessment (Signed)
[facility] Discharge Suicide Risk Assessment   Principal Problem: Depression Discharge Diagnoses: Principal Problem:   Depression Active Problems:   DM (diabetes mellitus), type 2, uncontrolled ([place])   MDD (major depressive disorder), recurrent severe, without psychosis ([place])   Total Time spent with patient: 30 minutes  Musculoskeletal: Strength & Muscle Tone: within normal limits Gait & Station: normal Patient leans: N/A  Psychiatric Specialty Exam: ROS no  chest pain or shortness of breath, no cough,no fever, no chills  Blood pressure (!) 157/73, pulse 79, temperature 98 F (36.7 C), temperature source Oral, resp. rate 16, height 5\' 7"  (1.702 m), weight 105.2 kg, SpO2 96 %.Body mass index is 36.34 kg/m.  General Appearance: Well Groomed  Eye Contact::  Good  Speech:  Normal Rate409  Volume:  Normal  Mood:  reports improving mood and states he is feeling noticeably better than he did on admission  Affect:  Appropriate and becoming more reactive, smiles at times during session  Thought Process:  Linear and Descriptions of Associations: Intact  Orientation:  Full (Time, Place, and Person)  Thought Content:  no hallucinations or delusions, currently future oriented   Suicidal Thoughts:  No denies suicidal or self injurious ideations, denies homicidal or violent ideations  Homicidal Thoughts:  No  Memory:  recent and remote grossly intact   Judgement:  Other:  improving   Insight:  improving   Psychomotor Activity:  Normal  Concentration:  Good  Recall:  Good  Fund of Knowledge:Good  Language: Good  Akathisia:  Negative  Handed:  Right  AIMS (if indicated):     Assets:  Desire for Improvement Resilience  Sleep:  Number of Hours: 6.25  Cognition: WNL  ADL's:  Intact   Mental Status Per Nursing Assessment::   On Admission:  Suicidal ideation indicated by patient, Suicide plan  Demographic Factors:  63 year old male   Loss Factors: Family separation, current homelessness    Historical Factors: History of Depression  Risk Reduction Factors:   Positive coping skills or problem solving skills  Continued Clinical Symptoms:  Currently patient is alert, attentive, calm, reports improving mood and presents with a more reactive affect . Denies suicidal ideations and presents future oriented,denies homicidal or violent ideations, no hallucinations or delusions. Denies medication side effects and states he feels medications are " beginning to kick in". Side effects reviewed, including risk of seizures on Buproprion. Behavior on unit calm and in good control, polite on approach. Currently patient states he may opt to go to [facility], but more likely is going to wait for his next check /income and get a hotel room. He also may opt to utilize [facility] resources. At this time his immediate goal is to keep pain management appointment scheduled for later today. He also plans to follow up with PCP for ongoing medical management as needed. He also states he would return to ED if he notices worsening or deterioration of mood symptoms.  CBG today 122.    Cognitive Features That Contribute To Risk:  No gross cognitive deficits noted upon discharge. Is alert , attentive, and oriented x 3   Suicide Risk:  Mild:  Suicidal ideation of limited frequency, intensity, duration, and specificity.  There are no identifiable plans, no associated intent, mild dysphoria and related symptoms, good self-control (both objective and subjective assessment), few other risk factors, and identifiable protective factors, including available and accessible social support.  Follow-up Information    [facility] Follow up  on 06/21/2019.   Why: Medication management appointment is Wednesday, 7/22 at 1:20p. Please bring your current medications and discharge paperwork from this hospitalization.  Contact information: [address] [place]  [address] Ph: [phone] Fx: [phone]          Plan Of Care/Follow-up recommendations:  Activity:  as tolerated  Diet:  Diabetic/Heart healthy Tests:  NA Other:  See below Patient is requesting discharge/expressing readiness for discharge and there are no current grounds for further involuntary commitment.  Plans to follow up as above.  Plans to follow up with PCP for ongoing diabetic /medical management .   Craige Cotta, MD 06/21/2019, 9:07 AM
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
## 2019-06-21 NOTE — ED Notes (Signed)
EKG given to EDP,Goldston,.MD., for review. 

## 2019-06-21 NOTE — Plan of Care (Signed)
Discharge note  Patient verbalizes readiness for discharge. Follow up plan explained, AVS, Transition record and SRA given. Prescriptions and teaching provided. Belongings returned and signed for.Suicide safety plan completed and signed. Patient verbalizes understanding. Patient denies SI/HI and assures this Probation officer he will seek assistance should that change. Patient discharged to lobby where lyft was waiting.  Problem: Education: Goal: Utilization of techniques to improve thought processes will improve Outcome: Adequate for Discharge Goal: Knowledge of the prescribed therapeutic regimen will improve Outcome: Adequate for Discharge   Problem: Activity: Goal: Interest or engagement in leisure activities will improve Outcome: Adequate for Discharge Goal: Imbalance in normal sleep/wake cycle will improve Outcome: Adequate for Discharge   Problem: Coping: Goal: Coping ability will improve Outcome: Adequate for Discharge Goal: Will verbalize feelings Outcome: Adequate for Discharge   Problem: Health Behavior/Discharge Planning: Goal: Ability to make decisions will improve Outcome: Adequate for Discharge Goal: Compliance with therapeutic regimen will improve Outcome: Adequate for Discharge   Problem: Role Relationship: Goal: Will demonstrate positive changes in social behaviors and relationships Outcome: Adequate for Discharge   Problem: Safety: Goal: Ability to disclose and discuss suicidal ideas will improve Outcome: Adequate for Discharge Goal: Ability to identify and utilize support systems that promote safety will improve Outcome: Adequate for Discharge   Problem: Self-Concept: Goal: Will verbalize positive feelings about self Outcome: Adequate for Discharge Goal: Level of anxiety will decrease Outcome: Adequate for Discharge   Problem: Education: Goal: Knowledge of [facility] Education information/materials will improve Outcome: Adequate for Discharge Goal:  Emotional status will improve Outcome: Adequate for Discharge Goal: Mental status will improve Outcome: Adequate for Discharge Goal: Verbalization of understanding the information provided will improve Outcome: Adequate for Discharge   Problem: Activity: Goal: Interest or engagement in activities will improve Outcome: Adequate for Discharge Goal: Sleeping patterns will improve Outcome: Adequate for Discharge   Problem: Coping: Goal: Ability to verbalize frustrations and anger appropriately will improve Outcome: Adequate for Discharge Goal: Ability to demonstrate self-control will improve Outcome: Adequate for Discharge   Problem: Health Behavior/Discharge Planning: Goal: Identification of resources available to assist in meeting health care needs will improve Outcome: Adequate for Discharge Goal: Compliance with treatment plan for underlying cause of condition will improve Outcome: Adequate for Discharge   Problem: Physical Regulation: Goal: Ability to maintain clinical measurements within normal limits will improve Outcome: Adequate for Discharge   Problem: Safety: Goal: Periods of time without injury will increase Outcome: Adequate for Discharge   Problem: Education: Goal: Knowledge of [place] Education information/materials will improve Outcome: Adequate for Discharge Goal: Emotional status will improve Outcome: Adequate for Discharge Goal: Mental status will improve Outcome: Adequate for Discharge Goal: Verbalization of understanding the information provided will improve Outcome: Adequate for Discharge   Problem: Activity: Goal: Interest or engagement in activities will improve Outcome: Adequate for Discharge Goal: Sleeping patterns will improve Outcome: Adequate for Discharge   Problem: Coping: Goal: Ability to verbalize frustrations and anger appropriately will improve Outcome: Adequate for Discharge Goal: Ability to demonstrate self-control will  improve Outcome: Adequate for Discharge   Problem: Health Behavior/Discharge Planning: Goal: Identification of resources available to assist in meeting health care needs will improve Outcome: Adequate for Discharge Goal: Compliance with treatment plan for underlying cause of condition will improve Outcome: Adequate for Discharge   Problem: Physical Regulation: Goal: Ability to maintain clinical measurements within normal limits will improve Outcome: Adequate for Discharge   Problem: Safety: Goal: Periods of time without injury will increase  Outcome: Adequate for Discharge   Problem: Education: Goal: Ability to make informed decisions regarding treatment will improve Outcome: Adequate for Discharge   Problem: Coping: Goal: Coping ability will improve Outcome: Adequate for Discharge   Problem: Health Behavior/Discharge Planning: Goal: Identification of resources available to assist in meeting health care needs will improve Outcome: Adequate for Discharge   Problem: Medication: Goal: Compliance with prescribed medication regimen will improve Outcome: Adequate for Discharge   Problem: Self-Concept: Goal: Ability to disclose and discuss suicidal ideas will improve Outcome: Adequate for Discharge Goal: Will verbalize positive feelings about self Outcome: Adequate for Discharge

## 2019-06-21 NOTE — Addendum Note (Signed)
Addended by: Caro Hight on: 06/21/2019 02:27 PM   Modules accepted: Orders

## 2019-06-21 NOTE — H&P (Signed)
History and Physical    [name] [name] ZOX:[id number] DOB: 08/20/1956 DOA: 06/21/2019  PCP: Sherren MochaShaw, Eva N, MD   Patient coming from: Home.  I have personally briefly reviewed patient's old medical records in [facility] [facility]  Chief Complaint: Multiple medication OD.  HPI: [name] [name] is a [age] y.o. male with medical history significant of Barrett's esophagus, GERD, CAD/NSTEMI, depression, type 2 diabetes, history of hemorrhoids, hyperlipidemia, hypertension, peripheral neuropathy who [facility] and is now coming to the emergency department after taking metoprolol, insulin and possibly methocarbamol.  The patient does not know how many units of insulin he injected, but has been consistently hypoglycemic.  He denies fever, headache, sore throat, rhinorrhea, dyspnea, hemoptysis, chest pain, palpitations, diaphoresis, PND, orthopnea or pitting edema of the lower extremities.  Denies abdominal pain, nausea or emesis, diarrhea or constipation, melena or hematochezia.  No dysuria, frequency or hematuria.  ED Course: Initial vital signs temperature 98.1 F, pulse 71, respirations 16, blood pressure 112/69 mmHg and O2 sat 99% on room air.  The patient was given dextrose 10 and 50% in the emergency department.  CBC was normal.  CMP shows total protein of 8.8 g/dL and alkaline phosphatase of 160 units/L, all other values are within normal limits.  Initial lactic acid was 2.4 and repeat was 1.2 mmol/L.  Acetaminophen, salicylate and until alcohol levels were unremarkable.  SARS coronavirus 2 swab was negative.  Review of Systems: As per HPI otherwise 10 point review of systems negative.   Past Medical History:  Diagnosis Date  . Barrett's esophagus   . CAD (coronary artery disease)    a. NSTEMI 05/2014 - occluded RCA dominant proximal s/p asp-thrombectomy/DES to RCA, minimal LAD/LCx, EF 50% by cath, 65-70% by echo.  . Depression   . Diabetes type 2, uncontrolled (HCC)   . Former  tobacco use   . GERD (gastroesophageal reflux disease)   . Hemorrhoids    hx of  . Hypercholesterolemia   . Hypertension   . MI (myocardial infarction) (HCC)   . Peripheral neuropathy     Past Surgical History:  Procedure Laterality Date  . LEFT HEART CATHETERIZATION WITH CORONARY ANGIOGRAM N/A 06/14/2014   Procedure: LEFT HEART CATHETERIZATION WITH CORONARY ANGIOGRAM;  Surgeon: Corky CraftsJayadeep S Varanasi, MD;  [facility];  Service: Cardiovascular;  Laterality: N/A;  . TONSILLECTOMY       reports that he quit smoking about 20 years ago. He has a 0.75 pack-year smoking history. He has never used smokeless tobacco. He reports that he does not drink alcohol or use drugs.  No Known Allergies  Family History  Problem Relation Age of Onset  . Hypertension Mother   . Alzheimer's disease Mother   . Alcohol abuse Father   . Cancer Father [phone]       "Throat"  . Diabetes type II Brother   . Cancer Brother 68       throat cancer  . Heart disease Neg Hx    Prior to Admission medications   Medication Sig Start Date End Date Taking? Authorizing Provider  buPROPion (WELLBUTRIN XL) 150 MG 24 hr tablet Take 1 tablet (150 mg total) by mouth daily. 06/22/19  Yes Aldean BakerSykes, Janet E, NP  calcium carbonate (TUMS EX) 750 MG chewable tablet Chew 2 tablets by mouth daily as needed for heartburn.    Yes [provider]  EQ ASPIRIN ADULT LOW DOSE 81 MG EC tablet TAKE ONE TABLET BY MOUTH ONCE DAILY  Patient taking differently: Take 81 mg by mouth daily.  07/02/15  Yes Corky Crafts, MD  gabapentin (NEURONTIN) 100 MG capsule Take 2 capsules (200 mg total) by mouth 2 (two) times daily. 06/21/19  Yes Aldean Baker, NP  HYDROcodone-acetaminophen (NORCO) 7.5-325 MG tablet Take 1 tablet by mouth every 6 (six) hours as needed for moderate pain. 06/21/19  Yes Ranelle Oyster, MD  insulin aspart (NOVOLOG) 100 UNIT/ML injection Inject 0-15 Units into the skin 3 (three) times daily before meals. 121-150=2  units, 151-200=4 units, 201-250=7 units, 251-300=9 units, 301-350=12 units, >351=15 units   Yes [provider]  insulin detemir (LEVEMIR) 100 UNIT/ML injection Inject 30 Units into the skin 2 (two) times daily.   Yes [provider]  lisinopril (ZESTRIL) 10 MG tablet Take 1 tablet (10 mg total) by mouth daily. 06/22/19  Yes Aldean Baker, NP  metoprolol tartrate (LOPRESSOR) 25 MG tablet Take 1 tablet (25 mg total) by mouth 2 (two) times daily. 06/21/19  Yes Aldean Baker, NP  pantoprazole (PROTONIX) 40 MG tablet Take 1 tablet (40 mg total) by mouth daily. 05/17/19  Yes Ward, Chase Picket, PA-C  rosuvastatin (CRESTOR) 40 MG tablet TAKE 1 TABLET(40 MG) BY MOUTH DAILY Patient taking differently: Take 40 mg by mouth daily.  12/29/18  Yes Sherren Mocha, MD  insulin aspart (NOVOLOG) 100 UNIT/ML injection Inject 0-15 Units into the skin 3 (three) times daily with meals for 30 days. For glucose 121 to 150 use 2 units, for 151 to 200 use 4 units, for 201-250 use 7 units, for 251 to 300 use 9 units, for 301 to 350 use 12 units, for 351 or greater use 15 units. 05/05/19 06/04/19  Arrien, York Ram, MD  insulin detemir (LEVEMIR) 100 UNIT/ML injection Inject 0.3 mLs (30 Units total) into the skin 2 (two) times daily for 30 days. 05/05/19 06/04/19  Arrien, York Ram, MD  nitroGLYCERIN (NITROSTAT) 0.4 MG SL tablet Place 1 tablet (0.4 mg total) under the tongue every 5 (five) minutes as needed for chest pain (up to 3 doses). 11/10/16   Corky Crafts, MD    Physical Exam: Vitals:   06/21/19 2120 06/21/19 2200 06/21/19 2220 06/21/19 2240  BP: 129/67 120/64 135/70 118/65  Pulse: 71 65 65 68  Resp: 20 16 18 19   Temp:      TempSrc:      SpO2: 95% 96% 97% 97%  Weight:      Height:        Constitutional: NAD, calm, comfortable Eyes: PERRL, lids and conjunctivae normal ENMT: Mucous membranes are mildly dry. Posterior pharynx clear of any exudate or lesions. Neck: normal, supple, no  masses, no thyromegaly Respiratory: clear to auscultation bilaterally, no wheezing, no crackles. Normal respiratory effort. No accessory muscle use.  Cardiovascular: Regular rate and rhythm, no murmurs / rubs / gallops. No extremity edema. 2+ pedal pulses. No carotid bruits.  Abdomen: Soft, no tenderness, no masses palpated. No hepatosplenomegaly. Bowel sounds positive.  Musculoskeletal: no clubbing / cyanosis.  Good ROM, no contractures. Normal muscle tone.  Skin: no rashes, lesions, ulcers. No induration on limited dermatological examination. Neurologic: CN 2-12 grossly intact. Sensation intact, DTR normal. Strength 5/5 in all 4.  Psychiatric: Alert and oriented x 3.  Depressed mood.   Labs on Admission: I have personally reviewed following labs and imaging studies  CBC: Recent Labs  Lab 06/16/19 0655 06/21/19 1840  WBC 5.1 6.1  HGB 13.8 15.1  HCT 44.0  47.5  MCV 89.2 87.5  PLT 348 408*   Basic Metabolic Panel: Recent Labs  Lab 06/16/19 0655 06/21/19 1840 06/21/19 1852  NA 138 140  --   K 3.7 3.5  --   CL 105 105  --   CO2 21* 24  --   GLUCOSE 305* 84  --   BUN 17 15  --   CREATININE 0.95 1.11  --   CALCIUM 9.1 9.9  --   MG  --   --  2.1  PHOS  --   --  2.2*   GFR: Estimated Creatinine Clearance: 83.3 mL/min (by C-G formula based on SCr of 1.11 mg/dL). Liver Function Tests: Recent Labs  Lab 06/16/19 0655 06/21/19 1840  AST 25 27  ALT 40 25  ALKPHOS 141* 162*  BILITOT 0.4 0.8  PROT 7.2 8.8*  ALBUMIN 4.0 5.0   No results for input(s): LIPASE, AMYLASE in the last 168 hours. No results for input(s): AMMONIA in the last 168 hours. Coagulation Profile: No results for input(s): INR, PROTIME in the last 168 hours. Cardiac Enzymes: No results for input(s): CKTOTAL, CKMB, CKMBINDEX, TROPONINI in the last 168 hours. BNP (last 3 results) No results for input(s): PROBNP in the last 8760 hours. HbA1C: No results for input(s): HGBA1C in the last 72 hours. CBG: Recent  Labs  Lab 06/21/19 1842 06/21/19 1915 06/21/19 1952 06/21/19 2103 06/21/19 2217  GLUCAP 77 33* 212* 52* 54*   Lipid Profile: No results for input(s): CHOL, HDL, LDLCALC, TRIG, CHOLHDL, LDLDIRECT in the last 72 hours. Thyroid Function Tests: No results for input(s): TSH, T4TOTAL, FREET4, T3FREE, THYROIDAB in the last 72 hours. Anemia Panel: No results for input(s): VITAMINB12, FOLATE, FERRITIN, TIBC, IRON, RETICCTPCT in the last 72 hours. Urine analysis:    Component Value Date/Time   COLORURINE YELLOW 05/17/2019 1413   APPEARANCEUR CLEAR 05/17/2019 1413   LABSPEC 1.024 05/17/2019 1413   PHURINE 5.0 05/17/2019 1413   GLUCOSEU >=500 (A) 05/17/2019 1413   HGBUR NEGATIVE 05/17/2019 1413   BILIRUBINUR NEGATIVE 05/17/2019 1413   BILIRUBINUR negative 03/11/2018 1613   KETONESUR 80 (A) 05/17/2019 1413   PROTEINUR NEGATIVE 05/17/2019 1413   UROBILINOGEN 0.2 03/11/2018 1613   UROBILINOGEN 0.2 06/14/2014 0809   NITRITE NEGATIVE 05/17/2019 1413   LEUKOCYTESUR NEGATIVE 05/17/2019 1413    Radiological Exams on Admission: No results found.  EKG: Independently reviewed.  Vent. rate 70 BPM PR interval * ms QRS duration 76 ms QT/QTc 337/364 ms P-R-T axes 66 16 54 Sinus rhythm Borderline ST elevation, anterior leads  Assessment/Plan Principal Problem:   Hypoglycemia Admit to stepdown/inpatient. Received 1 mg of GlucaGen in the ED. Continue dextrose 10% infusion. CBG monitoring hourly and as needed. Solu-Medrol 40 mg IVP x1 dose. Encourage oral caloric intake. [facility] evaluation.  Active Problems:   Deliberate medication overdose, initial encounter [facility](HCC)   Depression Will have behavioral health evaluate in a.m. He will need to be discharged to their service in 48 to 72 hours. Continue Wellbutrin 150 mg p.o. daily.    DM (diabetes mellitus), type 2, uncontrolled (HCC) Carbohydrate modified diet. Hold insulin. Continue close CBG monitoring.    GERD (gastroesophageal reflux  disease) Continue pantoprazole 40 mg p.o. daily.    CAD (coronary artery disease) Continue aspirin and Crestor. Hold metoprolol for now.    Hyperlipidemia On Crestor 40 mg p.o. daily.    Chronic pain syndrome Continue gabapentin and Norco.   DVT prophylaxis: Lovenox SQ. Code Status: Full code. Family Communication: Disposition  Plan: Admit for hypoglycemia monitoring and treatment. Consults called: [place] in the morning. Admission status: Inpatient/stepdown.   Reubin Milan MD [facility]  If 7PM-7AM, please contact night-coverage [email or web address]  06/21/2019, 10:52 PM   This document was prepared using Dragon voice recognition software and may contain some unintended transcription errors.

## 2019-06-21 NOTE — Progress Notes (Signed)
  [facility] Adult Case Management Discharge Plan :  Will you be returning to the same living situation after discharge:  Yes,  patient reports he is discharging to [place] to get his car out of the parking lot. He reports he plans to go to the [facility] for additional housing resources.  At discharge, do you have transportation home?: Yes,  [employer] (Ly[employer]) transport Do you have the ability to pay for your medications: Yes,  [employer] Medicare; SSDI  Release of information consent forms completed and in the chart;  Patient's signature needed at discharge.  Patient to Follow up at: Follow-up Information    [facility] Follow up on 06/21/2019.   Why: Medication management appointment is Wednesday, 7/22 at 1:20p. Please bring your current medications and discharge paperwork from this hospitalization.  Contact information: [address] [place] [address] Ph: [phone] Fx: [phone]       [facility] Follow up on 07/07/2019.   Why: Primary care appointment is Friday, 8/7 at 8:30a.  Please bring your photo ID, insurance card, current medications and discharge paperwork from this hospitalization.  Contact information: [place] [address] [phone]       [facility] Follow up on 06/23/2019.   Why: Telephonic hospital follow up appointment is Friday, 7/24 at 9:00a.  The provider will contact you.  Contact information: [address] [place] [address] [phone]           Next level of care provider has access to [place] and Suicide Prevention discussed: Yes,  with the patient  Have you used any form of tobacco in the last 30 days? (Cigarettes, Smokeless Tobacco, Cigars, and/or Pipes): No  Has patient been referred to the Quitline?: N/A patient is not a smoker  Patient has been referred for addiction treatment: N/A  Marylee Floras, [place] 06/21/2019,  10:37 AM

## 2019-06-21 NOTE — Progress Notes (Signed)
Subjective:    Patient ID: [name] [name], male    DOB: 08/22/1956, 63 y.o.   MRN: [id number]  HPI   Denard is here in follow up of his chronic pain. He was admitted to behavioral health with severe depression last week. It worsened with family being away, stresses of life, his recent birthday, etc. He has been living at [employer] at [facility] apparently. He is working on housing now he tells me. He finished up the main part of his education in May with plans on looking for work this Fall.   He tested twice +THC ealier this year and again last week.  He states that his roommate was smoking and that he partook in smoking on a few occasions because "it was there".        Pain Inventory Average Pain 2 Pain Right Now 4 My pain is constant and aching  In the last 24 hours, has pain interfered with the following? General activity 3 Relation with others 1 Enjoyment of life 2 What TIME of day is your pain at its worst? night Sleep (in general) Fair  Pain is worse with: walking and inactivity Pain improves with: medication and injections Relief from Meds: 8  Mobility walk without assistance ability to climb steps?  yes do you drive?  yes  Function disabled: date disabled 2012  Neuro/Psych weakness numbness tingling spasms depression  Prior Studies Any changes since last visit?  no  Physicians involved in your care Any changes since last visit?  no   Family History  Problem Relation Age of Onset  . Hypertension Mother   . Alzheimer's disease Mother   . Alcohol abuse Father   . Cancer Father 28       "Throat"  . Diabetes type II Brother   . Cancer Brother 68       throat cancer  . Heart disease Neg Hx    Social History   Socioeconomic History  . Marital status: Single    Spouse name: Not on file  . Number of children: Not on file  . Years of education: Not on file  . Highest education level: Not on file  Occupational History  . Occupation: unemployed   Comment: applying for disability  Social Needs  . Financial resource strain: Not on file  . Food insecurity    Worry: Not on file    Inability: Not on file  . Transportation needs    Medical: Not on file    Non-medical: Not on file  Tobacco Use  . Smoking status: Former Smoker    Packs/day: 0.25    Years: 3.00    Pack years: 0.75    Quit date: 11/30/1998    Years since quitting: 20.5  . Smokeless tobacco: Never Used  Substance and Sexual Activity  . Alcohol use: No    Alcohol/week: 1.0 standard drinks    Types: 1 Standard drinks or equivalent per week  . Drug use: No  . Sexual activity: Yes    Birth control/protection: None  Lifestyle  . Physical activity    Days per week: Not on file    Minutes per session: Not on file  . Stress: Not on file  Relationships  . Social Herbalist on phone: Not on file    Gets together: Not on file    Attends religious service: Not on file    Active member of club or organization: Not on file    Attends  meetings of clubs or organizations: Not on file    Relationship status: Not on file  Other Topics Concern  . Not on file  Social History Narrative  . Not on file   Past Surgical History:  Procedure Laterality Date  . LEFT HEART CATHETERIZATION WITH CORONARY ANGIOGRAM N/A 06/14/2014   Procedure: LEFT HEART CATHETERIZATION WITH CORONARY ANGIOGRAM;  Surgeon: Corky Crafts, MD;  Location: [facility] CATH LAB;  Service: Cardiovascular;  Laterality: N/A;  . TONSILLECTOMY     Past Medical History:  Diagnosis Date  . Barrett's esophagus   . CAD (coronary artery disease)    a. NSTEMI 05/2014 - occluded RCA dominant proximal s/p asp-thrombectomy/DES to RCA, minimal LAD/LCx, EF 50% by cath, 65-70% by echo.  . Depression   . Diabetes type 2, uncontrolled (HCC)   . Former tobacco use   . GERD (gastroesophageal reflux disease)   . Hemorrhoids    hx of  . Hypercholesterolemia   . Hypertension   . MI (myocardial infarction) (HCC)   .  Peripheral neuropathy    BP 125/72   Pulse 80   Temp 98.1 F (36.7 C)   Ht 5\' 9"  (1.753 m)   Wt 231 lb (104.8 kg)   SpO2 97%   BMI 34.11 kg/m   Opioid Risk Score:   Fall Risk Score:  `1  Depression screen PHQ 2/9  Depression screen [facility] 2/9 09/12/2018 06/10/2018 04/12/2018 03/11/2018 10/01/2017 09/29/2017 03/08/2017  Decreased Interest 0 0 0 0 0 0 0  Down, Depressed, Hopeless 0 0 0 0 0 0 0  PHQ - 2 Score 0 0 0 0 0 0 0  Altered sleeping - - - - - - -  Tired, decreased energy - - - - - - -  Change in appetite - - - - - - -  Feeling bad or failure about yourself  - - - - - - -  Trouble concentrating - - - - - - -  Moving slowly or fidgety/restless - - - - - - -  Suicidal thoughts - - - - - - -  PHQ-9 Score - - - - - - -  Difficult doing work/chores - - - - - - -  Some recent data might be hidden     Review of Systems  Constitutional: Negative.   HENT: Negative.   Eyes: Negative.   Respiratory: Negative.   Cardiovascular: Negative.   Gastrointestinal: Positive for vomiting.  Endocrine: Negative.   Genitourinary: Negative.   Musculoskeletal: Positive for arthralgias, back pain and myalgias.  Skin: Negative.   Allergic/Immunologic: Negative.   Neurological: Positive for weakness and numbness.  Hematological: Negative.   Psychiatric/Behavioral: Positive for dysphoric mood.  All other systems reviewed and are negative.      Objective:   Physical Exam General: No acute distress HEENT: EOMI, oral membranes moist Cards: reg rate  Chest: normal effort Abdomen: Soft, NT, ND Skin: dry, intact Extremities: no edema Neuro:Pt is cognitively appropriate with normal insight, memory, and awareness. Cranial nerves 2-12 are intact. Sensory exam appears grosslynormal. Reflexes are 2+ in all 4's. Fine motor coordination is intact. No tremors. Motor function is grossly 5/5.  Musculoskeletal: generalized low back pain. Transfers easily. Marland Kitchen Psych:Pts behavior normal.        Assessment & Plan:  1. Bilateral hip pain. 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  2. Left medial calf cyst. History of previous inflammation.--resolved 3. Type II DM, poorly controlledf 4.Lumbar spondylosis with facet arthropathy  Plan: 1. Continuenorco at 5/325 one q6prn #120.       We will continue the controlled substance monitoring program, this consists of regular clinic visits, examinations, routine drug screening, pill counts as well as use of [place] Controlled Substance Reporting [place]. [place] today.    -will check UDS at next visit  -I Told him that any further positive THC tests would mean non-narcotic mgt here 2.Reviewed importance of better glucose control 3. Ortho plan per Delbert HarnessMurphy Wainer regarding right hip replacement-- l potentially pursue hip replacementat some point.  4.BP control better 5. Continue gabapentin to 200mg  bid for mood/pain.  6. Seizure prophylaxis with keppra. keppra d'ced 7. Severe depression: just discharged from behavioral health.   -continue wellbutrin  -asked him to call us if needed in any way  15 minutesof face to face patient care time were spent during this visit. All questions were encouraged and answered. Will follow up in 1 monthswith NP

## 2019-06-22 ENCOUNTER — Encounter (HOSPITAL_COMMUNITY): Payer: Self-pay

## 2019-06-22 ENCOUNTER — Telehealth: Payer: Self-pay

## 2019-06-22 DIAGNOSIS — E782 Mixed hyperlipidemia: Secondary | ICD-10-CM

## 2019-06-22 DIAGNOSIS — F329 Major depressive disorder, single episode, unspecified: Secondary | ICD-10-CM

## 2019-06-22 DIAGNOSIS — E160 Drug-induced hypoglycemia without coma: Secondary | ICD-10-CM

## 2019-06-22 DIAGNOSIS — T383X5A Adverse effect of insulin and oral hypoglycemic [antidiabetic] drugs, initial encounter: Secondary | ICD-10-CM

## 2019-06-22 DIAGNOSIS — I2510 Atherosclerotic heart disease of native coronary artery without angina pectoris: Secondary | ICD-10-CM

## 2019-06-22 DIAGNOSIS — T50992A Poisoning by other drugs, medicaments and biological substances, intentional self-harm, initial encounter: Principal | ICD-10-CM

## 2019-06-22 DIAGNOSIS — T50902A Poisoning by unspecified drugs, medicaments and biological substances, intentional self-harm, initial encounter: Secondary | ICD-10-CM

## 2019-06-22 DIAGNOSIS — F332 Major depressive disorder, recurrent severe without psychotic features: Secondary | ICD-10-CM

## 2019-06-22 DIAGNOSIS — E1165 Type 2 diabetes mellitus with hyperglycemia: Secondary | ICD-10-CM

## 2019-06-22 DIAGNOSIS — G894 Chronic pain syndrome: Secondary | ICD-10-CM

## 2019-06-22 DIAGNOSIS — K219 Gastro-esophageal reflux disease without esophagitis: Secondary | ICD-10-CM

## 2019-06-22 LAB — COMPREHENSIVE METABOLIC PANEL
ALT: 20 U/L (ref 0–44)
AST: 21 U/L (ref 15–41)
Albumin: 3.7 g/dL (ref 3.5–5.0)
Alkaline Phosphatase: 125 U/L (ref 38–126)
Anion gap: 9 (ref 5–15)
BUN: 13 mg/dL (ref 8–23)
CO2: 20 mmol/L — ABNORMAL LOW (ref 22–32)
Calcium: 8.8 mg/dL — ABNORMAL LOW (ref 8.9–10.3)
Chloride: 110 mmol/L (ref 98–111)
Creatinine, Ser: 0.86 mg/dL (ref 0.61–1.24)
GFR calc Af Amer: >60 mL/min (ref >60)
GFR calc non Af Amer: >60 mL/min (ref >60)
Glucose, Bld: 63 mg/dL — ABNORMAL LOW (ref 70–99)
Potassium: 3.8 mmol/L (ref 3.5–5.1)
Sodium: 139 mmol/L (ref 135–145)
Total Bilirubin: 0.5 mg/dL (ref 0.3–1.2)
Total Protein: 6.5 g/dL (ref 6.5–8.1)

## 2019-06-22 LAB — GLUCOSE, CAPILLARY
Glucose-Capillary: 102 mg/dL — ABNORMAL HIGH (ref 70–99)
Glucose-Capillary: 74 mg/dL (ref 70–99)
Glucose-Capillary: 105 mg/dL — ABNORMAL HIGH (ref 70–99)
Glucose-Capillary: 94 mg/dL (ref 70–99)
Glucose-Capillary: 111 mg/dL — ABNORMAL HIGH (ref 70–99)
Glucose-Capillary: 108 mg/dL — ABNORMAL HIGH (ref 70–99)
Glucose-Capillary: 105 mg/dL — ABNORMAL HIGH (ref 70–99)
Glucose-Capillary: 98 mg/dL (ref 70–99)
Glucose-Capillary: 93 mg/dL (ref 70–99)
Glucose-Capillary: 97 mg/dL (ref 70–99)
Glucose-Capillary: 116 mg/dL — ABNORMAL HIGH (ref 70–99)
Glucose-Capillary: 100 mg/dL — ABNORMAL HIGH (ref 70–99)
Glucose-Capillary: 57 mg/dL — ABNORMAL LOW (ref 70–99)
Glucose-Capillary: 113 mg/dL — ABNORMAL HIGH (ref 70–99)
Glucose-Capillary: 81 mg/dL (ref 70–99)
Glucose-Capillary: 115 mg/dL — ABNORMAL HIGH (ref 70–99)
Glucose-Capillary: 118 mg/dL — ABNORMAL HIGH (ref 70–99)
Glucose-Capillary: 127 mg/dL — ABNORMAL HIGH (ref 70–99)
Glucose-Capillary: 188 mg/dL — ABNORMAL HIGH (ref 70–99)
Glucose-Capillary: 210 mg/dL — ABNORMAL HIGH (ref 70–99)
Glucose-Capillary: 236 mg/dL — ABNORMAL HIGH (ref 70–99)
Glucose-Capillary: 227 mg/dL — ABNORMAL HIGH (ref 70–99)
Glucose-Capillary: 222 mg/dL — ABNORMAL HIGH (ref 70–99)

## 2019-06-22 LAB — CBC
HCT: 40.3 % (ref 39.0–52.0)
Hemoglobin: 13 g/dL (ref 13.0–17.0)
MCH: 28.8 pg (ref 26.0–34.0)
MCHC: 32.3 g/dL (ref 30.0–36.0)
MCV: 89.2 fL (ref 80.0–100.0)
Platelets: 357 10*3/uL (ref 150–400)
RBC: 4.52 MIL/uL (ref 4.22–5.81)
RDW: 13.5 % (ref 11.5–15.5)
WBC: 9.9 10*3/uL (ref 4.0–10.5)
nRBC: 0 % (ref 0.0–0.2)

## 2019-06-22 LAB — ACETAMINOPHEN LEVEL: Acetaminophen (Tylenol), Serum: <10 ug/mL — ABNORMAL LOW (ref 10–30)

## 2019-06-22 LAB — RAPID URINE DRUG SCREEN, HOSP PERFORMED
Amphetamines: NOT DETECTED
Barbiturates: NOT DETECTED
Benzodiazepines: NOT DETECTED
Cocaine: NOT DETECTED
Opiates: POSITIVE — AB
Tetrahydrocannabinol: POSITIVE — AB

## 2019-06-22 LAB — MRSA PCR SCREENING: MRSA by PCR: NEGATIVE

## 2019-06-22 MED ORDER — DEXTROSE 50 % IV SOLN
50.0000 mL | Freq: Once | INTRAVENOUS | Status: AC
  Administered 2019-06-23 (×2): 25 mL via INTRAVENOUS

## 2019-06-22 MED ORDER — ORAL CARE MOUTH RINSE
15.0000 mL | Freq: Two times a day (BID) | OROMUCOSAL | Status: DC
  Administered 2019-06-22 – 2019-06-23 (×3): 15 mL via OROMUCOSAL

## 2019-06-22 MED ORDER — DEXTROSE 50 % IV SOLN
INTRAVENOUS | Status: AC
  Filled 2019-06-22: qty 50

## 2019-06-22 MED ORDER — CHLORHEXIDINE GLUCONATE CLOTH 2 % EX PADS
6.0000 | MEDICATED_PAD | Freq: Every day | CUTANEOUS | Status: DC
  Administered 2019-06-22: 11:00:00 6 via TOPICAL

## 2019-06-22 MED ORDER — DEXTROSE 10 % IV SOLN
INTRAVENOUS | Status: DC
  Administered 2019-06-22 (×2): via INTRAVENOUS
  Filled 2019-06-22 (×6): qty 1000

## 2019-06-22 NOTE — TOC Initial Note (Signed)
Transition of Care [facility]) - Initial/Assessment Note    Patient Details  Name: [name] [name] MRN: [id number] Date of Birth: [birth year]  [facility] CM/SW Contact:    Nelwyn Salisbury, LCSW Phone Number: [phone] 06/22/2019, 1:04 PM  Clinical Narrative:   Pt admitted with intentional drug overdose, history of same and was just inpatient at [facility] 7/16-7/22/20. Pt is homeless and reports being unable to access homeless resources due to [phone] community effects. Psychiatry consult recommending pt receive inpatient psychiatric treatment again when medically stable. TOC team will follow to assist.                 Expected Discharge Plan: Psychiatric Hospital Barriers to Discharge: Continued Medical Work up   Patient Goals and CMS Choice        Expected Discharge Plan and Services Expected Discharge Plan: Psychiatric Hospital In-house Referral: Clinical Social Work     Living arrangements for the past 2 months: Homeless Expected Discharge Date: (unknown)                                    Prior Living Arrangements/Services Living arrangements for the past 2 months: Homeless   Patient language and need for interpreter reviewed:: No              Criminal Activity/Legal Involvement Pertinent to Current Situation/Hospitalization: No - Comment as needed  Activities of Daily Living Home Assistive Devices/Equipment: CBG Meter, Eyeglasses(has meter but out of test strips) ADL Screening (condition at time of admission) Patient's cognitive ability adequate to safely complete daily activities?: Yes Is the patient deaf or have difficulty hearing?: No Does the patient have difficulty seeing, even when wearing glasses/contacts?: No Does the patient have difficulty concentrating, remembering, or making decisions?: No Patient able to express need for assistance with ADLs?: Yes Does the patient have difficulty dressing or bathing?: No Independently performs ADLs?: Yes  (appropriate for developmental age) Does the patient have difficulty walking or climbing stairs?: Yes(decondary to joint pain and weakness) Weakness of Legs: Both Weakness of Arms/Hands: Both  Permission Sought/Granted                  Emotional Assessment Appearance:: Appears stated age       Alcohol / Substance Use: Illicit Drugs Psych Involvement: Yes (comment)(see above)  Admission diagnosis:  Hypoglycemia [E16.2] Intentional drug overdose, initial encounter [facility]) [T50.902A] Patient Active Problem List   Diagnosis Date Noted  . Deliberate medication overdose, initial encounter (HCC) 06/22/2019  . Hypoglycemia 06/21/2019  . MDD (major depressive disorder), recurrent severe, without psychosis (HCC) 06/15/2019  . Cerebral thrombosis with cerebral infarction 01/15/2019  . Subarachnoid hemorrhage 01/15/2019  . Stroke-like symptoms 01/14/2019  . Hypoglycemia due to insulin 01/14/2019  . Noncompliance with medications   . Homeless   . DKA (diabetic ketoacidosis) (HCC) 11/27/2018  . Chest pain of uncertain etiology 11/19/2018  . Dizziness 11/19/2018  . Bilateral low back pain without sciatica 04/12/2018  . Vitamin D deficiency 04/07/2018  . Pill dysphagia 04/07/2018  . Enlarged thyroid gland 04/07/2018  . Labile blood glucose 04/07/2018  . Brittle diabetes mellitus (HCC) 04/07/2018  . AKI (acute kidney injury) (HCC) 09/22/2017  . Chronic pain syndrome 08/18/2016  . Diabetic ketoacidosis (HCC) 04/12/2016  . Hip pain, bilateral 08/23/2015  . Chronic neck pain 08/23/2015  . DKA (diabetic ketoacidoses) (HCC) 05/30/2015  . Acute renal failure (HCC) 05/30/2015  . Restless  legs 08/31/2014  . Uncontrolled diabetes mellitus type 2 with peripheral artery disease ([place]) 08/10/2014  . Old myocardial infarction 08/10/2014  . CAD (coronary artery disease) 06/18/2014  . Hyperlipidemia 06/18/2014  . NSTEMI (non-ST elevated myocardial infarction) ([place]) 06/14/2014  . N&V (nausea and  vomiting) 07/21/2013  . Chronic pain in left shoulder 04/06/2013  . Peripheral neuropathy 02/28/2012  . Depression 12/25/2011  . GERD (gastroesophageal reflux disease) 12/25/2011  . DM (diabetes mellitus), type 2, uncontrolled ([place]) 11/18/2011   PCP:  Shawnee Knapp, MD Pharmacy:   [facility] [phone] [address], [place] [address] [place] [address] Phone: [phone] Fax: [phone]     Social Determinants of Health (SDOH) Interventions    Readmission Risk Interventions No flowsheet data found.

## 2019-06-22 NOTE — Telephone Encounter (Signed)
Error
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
## 2019-06-22 NOTE — Progress Notes (Signed)
PROGRESS NOTE  JOAQUIN KNEBEL DGL:[id number] DOB: [birth year] DOA: 06/21/2019 PCP: Shawnee Knapp, MD  Brief History   The patient is a 63 yr old man who was discharged from [facility] on the morning of 06/21/2019. He presented back to [facility] ED last night lethargic. He admitted to taking overdoses of metoprolol, Detemir insulin, and possibly methacarbamol. He has been admitted to a telemetry bed, and he has been on D10 gtt. Despite this the patient has been hypoglycemic this afternoon, and required an increased rate of D10.   Psychiatry has been consulted and has accepted the patient to return to inpatient psychiatric treatment when he is medically cleared for discharge.  Consultants  . Psychiatry  Procedures  . None  Antibiotics   Anti-infectives (From admission, onward)   None    .   Subjective  The patient is resting comfortably. He states that he has no appetite.  Objective   Vitals:  Vitals:   06/22/19 1200 06/22/19 1201  BP: (!) 161/79 140/77  Pulse: 76   Resp:    Temp:  98 F (36.7 C)  SpO2: 97% 96%    Exam:  Constitutional:  . The patient is awake, alert, and oriented x 3. He has a very flat affect and isn't speaking much. Marland Kitchen  Respiratory:  . No increased work of breathing.  . No wheezes, rales, or rhonchi. . No tactile fremitus Cardiovascular:  . Regular rate and rhythm. . No murmurs, ectopy, or gallups are appreciated. . No lateral PMI. No thrills. Abdomen:  . Abdomen is soft, non-tender, non-distended. . No hernias, masses, or organomegaly are appreciated. . Normoactive bowel sounds are present. Musculoskeletal:  . No cyanosis, clubbing, or edema Skin:  . No rashes, lesions, ulcers . palpation of skin: no induration or nodules Neurologic:  . CN 2-12 intact . Sensation all 4 extremities intact Psychiatric:  . Mental status o Mood: depressed. Affect: Falt o Orientation to person, place, time  . judgment and insight are not intact   I have personally  reviewed the following:   Today's Data  . Vitals, glucoses, CMP, CBC  Scheduled Meds: . aspirin EC  81 mg Oral Daily  . buPROPion  150 mg Oral Daily  . Chlorhexidine Gluconate Cloth  6 each Topical Daily  . dextrose  50 mL Intravenous Once  . dextrose      . enoxaparin (LOVENOX) injection  40 mg Subcutaneous QHS  . gabapentin  200 mg Oral BID  . lisinopril  10 mg Oral Daily  . mouth rinse  15 mL Mouth Rinse BID  . pantoprazole  40 mg Oral Daily  . phosphorus  500 mg Oral BID  . rosuvastatin  40 mg Oral q1800  . sodium chloride flush  3 mL Intravenous Q12H   Continuous Infusions: . sodium chloride    . D-10-0.45% Sodium Chloride with KCL 40 meq/L 1000 ml 125 mL/hr at 06/22/19 1200  . dextrose      Principal Problem:   MDD (major depressive disorder), recurrent severe, without psychosis ([place]) Active Problems:   DM (diabetes mellitus), type 2, uncontrolled ([place])   Depression   GERD (gastroesophageal reflux disease)   CAD (coronary artery disease)   Hyperlipidemia   Chronic pain syndrome   Hypoglycemia   Deliberate medication overdose, initial encounter ([place])   LOS: 1 day    A & P   Suicidality: Patient has been accepted to return to [facility] for continued inpatient psychiatric treatment when medically cleared. I appreciate psychiatry's  assistance.  Deliberate medication overdose: Pt is being monitored on telemetry. Glucoses will be monitored frequently. Lethargy is clearing.  Hypoglycemia: Patient took an overdose of Detemir, an unknown quantity. This is peaking and requiring an increased rate of D10 infusion. Glucoses will be monitored frequently.  DM II, Uncontrolled: The patient is on a carbohydrate controlled diet, but he is not eating much. Blood sugars are supported by D 10.  GERD: Continue pantoprazole 40 mg daily.  CAD: Continue ASA 81 mg and crestor. Metoprolol is held due to intentional overdose. Monitor on telemetry.  Hyperlipidemia: Continue crestor 40 mg as  at home.  Chronic pain syndrome: Continue gabapentin and Norco.  I have seen and examined this patient myself. I have spent 38 minute in his evaluation and care.  DVT prophylaxis: Lovenox Code Status: Full Code Family Communication: None available. Disposition Plan: Discharge to [facility] when blood sugars are stable off of dextrose drip.  Adhrit Krenz, DO [facility] Direct contact: see [email or web address]  7PM-7AM contact night coverage as above 06/22/2019, 1:58 PM  LOS: 1 day

## 2019-06-23 ENCOUNTER — Encounter (HOSPITAL_COMMUNITY): Payer: Self-pay

## 2019-06-23 LAB — GLUCOSE, CAPILLARY
Glucose-Capillary: 102 mg/dL — ABNORMAL HIGH (ref 70–99)
Glucose-Capillary: 117 mg/dL — ABNORMAL HIGH (ref 70–99)
Glucose-Capillary: 119 mg/dL — ABNORMAL HIGH (ref 70–99)
Glucose-Capillary: 123 mg/dL — ABNORMAL HIGH (ref 70–99)
Glucose-Capillary: 130 mg/dL — ABNORMAL HIGH (ref 70–99)
Glucose-Capillary: 135 mg/dL — ABNORMAL HIGH (ref 70–99)
Glucose-Capillary: 136 mg/dL — ABNORMAL HIGH (ref 70–99)
Glucose-Capillary: 138 mg/dL — ABNORMAL HIGH (ref 70–99)
Glucose-Capillary: 145 mg/dL — ABNORMAL HIGH (ref 70–99)
Glucose-Capillary: 177 mg/dL — ABNORMAL HIGH (ref 70–99)
Glucose-Capillary: 52 mg/dL — ABNORMAL LOW (ref 70–99)
Glucose-Capillary: 59 mg/dL — ABNORMAL LOW (ref 70–99)
Glucose-Capillary: 60 mg/dL — ABNORMAL LOW (ref 70–99)
Glucose-Capillary: 60 mg/dL — ABNORMAL LOW (ref 70–99)
Glucose-Capillary: 61 mg/dL — ABNORMAL LOW (ref 70–99)
Glucose-Capillary: 68 mg/dL — ABNORMAL LOW (ref 70–99)
Glucose-Capillary: 76 mg/dL (ref 70–99)
Glucose-Capillary: 77 mg/dL (ref 70–99)
Glucose-Capillary: 81 mg/dL (ref 70–99)
Glucose-Capillary: 87 mg/dL (ref 70–99)
Glucose-Capillary: 89 mg/dL (ref 70–99)
Glucose-Capillary: 90 mg/dL (ref 70–99)
Glucose-Capillary: 95 mg/dL (ref 70–99)

## 2019-06-23 MED ORDER — DEXTROSE 50 % IV SOLN
INTRAVENOUS | Status: AC
  Administered 2019-06-23: 50 mL
  Filled 2019-06-23: qty 50

## 2019-06-23 MED ORDER — DEXTROSE 10 % IV SOLN
INTRAVENOUS | Status: DC
  Administered 2019-06-23 – 2019-06-24 (×2): via INTRAVENOUS
  Filled 2019-06-23 (×2): qty 1000

## 2019-06-23 MED ORDER — INSULIN DETEMIR 100 UNIT/ML ~~LOC~~ SOLN: 20.0000 [IU] | Freq: Two times a day (BID) | SUBCUTANEOUS | 0 refills | Status: DC

## 2019-06-23 NOTE — Discharge Summary (Addendum)
Physician Discharge Summary  [name] [name] VHQ:[id number] DOB: 03-08-1956 DOA: 06/21/2019  PCP: Sherren MochaShaw, Eva N, MD  Admit date: 06/21/2019 Discharge date: 06/23/2019  Recommendations for Outpatient Follow-up:  Patient is discharged to Behavioral Health to the care of Dr. Sharma CovertNorman, Check blood sugars once before breakfast and once before dinner. Follow sliding scale for Novolog. Follow up with PCP [facility]  Discharge Diagnoses: Principal diagnosis is #1 1. Intentional overdose of Detemir insulin, metoprolol, and methacarbomal. 2. Suicidality 3. Hypoglycemia 4. GERD 5. CAD 6. Hyperlipidemia 7. Chronic pain syndrome  Discharge Condition: Fair Disposition: Behavioral health  Diet recommendation: Heart healthy diet.  Filed Weights   06/21/19 1936 06/21/19 2310  Weight: 104.8 kg 104 kg    History of present illness:   [name] DamesKevin J Dillinger is a 63 y.o. male with medical history significant of Barrett's esophagus, GERD, CAD/NSTEMI, depression, type 2 diabetes, history of hemorrhoids, hyperlipidemia, hypertension, peripheral neuropathy who [facility] and is now coming to the emergency department after taking metoprolol, insulin and possibly methocarbamol.  The patient does not know how many units of insulin he injected, but has been consistently hypoglycemic.  He denies fever, headache, sore throat, rhinorrhea, dyspnea, hemoptysis, chest pain, palpitations, diaphoresis, PND, orthopnea or pitting edema of the lower extremities.  Denies abdominal pain, nausea or emesis, diarrhea or constipation, melena or hematochezia.  No dysuria, frequency or hematuria.  Hospital Course:  The patient was admitted to a telemetry bed. He was placed on a heart healthy diet and his glucoses were checked every 4 hours. The paitient was placed on IV D10 to maintain Glucoses greater than 70. This morning the patient was still requiring D10, but by midmorning his  glucoses were consistently high enough that D10 was stopped. He has been in the 90-110 range since then.   Psychiatry was consulted on 06/21/2019. [facility] Dr. Sharma CovertNorman. He is discharged to behavioral health in fair condition. He will restart his Detemir tomorrow at a reduced dose from what he was previously taking at home. His glucoses will be checked twice a day and followed with SSI.  Today's assessment: S: The patient is resting comfortably. No new complaints. O: Vitals:  Vitals:   06/23/19 1100 06/23/19 1200  BP: (!) 136/58 (!) 123/51  Pulse: 82 81  Resp:    Temp:  98.1 F (36.7 C)  SpO2: 97% 96%    Constitutional: . The patient is awake, alert, and oriented x 3. No acute distress. Respiratory:  . No increased work of breathing. . No wheezes, rales, or rhonchi. . No tactile fremitus. Cardiovascular:  . Regular rate and rhythm. . No murmurs, ectopy, or gallups. . No lateral PMI. No thrills. Abdomen:  . Abdomen is soft, non-tender, non-distended. . No hernias, masses, or organomegaly . Normoactive bowel sounds. Musculoskeletal:  . No cyanosis, clubbing, or edema. Skin:  . No rashes, lesions, ulcers . palpation of skin: no induration or nodules Neurologic:  . CN 2-12 intact . Sensation all 4 extremities intact Psychiatric:  . judgement and insight are impaired. . Mental status o Mood is depressed and affect is flat. o Orientation to person, place, time   Discharge Instructions  Discharge Instructions    Activity as tolerated - No restrictions   Complete by: As directed    Call MD for:  persistant nausea and vomiting   Complete by: As directed    Call MD  for:  severe uncontrolled pain   Complete by: As directed    Diet - low sodium heart healthy   Complete by: As directed    Discharge instructions   Complete by: As directed    Patient is discharged to Behavioral health to the care of Dr. Sharma CovertNorman.  Bedtime shack daily. Follow up with PCP in 7-10 days after discharge. Check Finger Stick Blood Sugars Every morning before breakfast and each evening before dinner. Follow sliding scale for novolog.   Increase activity slowly   Complete by: As directed      Allergies as of 06/23/2019   No Known Allergies     Medication List    TAKE these medications   buPROPion 150 MG 24 hr tablet Commonly known as: WELLBUTRIN XL Take 1 tablet (150 mg total) by mouth daily.   calcium carbonate 750 MG chewable tablet Commonly known as: TUMS EX Chew 2 tablets by mouth daily as needed for heartburn.   EQ Aspirin Adult Low Dose 81 MG EC tablet Generic drug: aspirin TAKE ONE TABLET BY MOUTH ONCE DAILY What changed: how much to take   gabapentin 100 MG capsule Commonly known as: NEURONTIN Take 2 capsules (200 mg total) by mouth 2 (two) times daily.   HYDROcodone-acetaminophen 7.5-325 MG tablet Commonly known as: NORCO Take 1 tablet by mouth every 6 (six) hours as needed for moderate pain.   insulin aspart 100 UNIT/ML injection Commonly known as: novoLOG Inject 0-15 Units into the skin 3 (three) times daily before meals. 121-150=2 units, 151-200=4 units, 201-250=7 units, 251-300=9 units, 301-350=12 units, >351=15 units What changed: Another medication with the same name was removed. Continue taking this medication, and follow the directions you see here.   insulin detemir 100 UNIT/ML injection Commonly known as: LEVEMIR Inject 0.2 mLs (20 Units total) into the skin 2 (two) times daily. Start taking on: June 24, 2019 What changed:   how much to take  Another medication with the same name was removed. Continue taking this medication, and follow the directions you see here.   lisinopril 10 MG tablet Commonly known as: ZESTRIL Take 1 tablet (10 mg total) by mouth daily.   metoprolol tartrate 25 MG tablet Commonly known as: LOPRESSOR Take 1 tablet (25 mg total) by mouth 2 (two) times  daily.   nitroGLYCERIN 0.4 MG SL tablet Commonly known as: NITROSTAT Place 1 tablet (0.4 mg total) under the tongue every 5 (five) minutes as needed for chest pain (up to 3 doses).   pantoprazole 40 MG tablet Commonly known as: PROTONIX Take 1 tablet (40 mg total) by mouth daily.   rosuvastatin 40 MG tablet Commonly known as: CRESTOR TAKE 1 TABLET(40 MG) BY MOUTH DAILY What changed: See the new instructions.      No Known Allergies  The results of significant diagnostics from this hospitalization (including imaging, microbiology, ancillary and laboratory) are listed below for reference.    Significant Diagnostic Studies: No results found.  Microbiology: Recent Results (from the past 240 hour(s))  SARS Coronavirus 2 (CEPHEID - Performed in [facility] lab), Hosp Order     Status: None   Collection Time: 06/15/19  6:15 PM   Specimen: Nasopharyngeal Swab  Result Value Ref Range Status   SARS Coronavirus 2 NEGATIVE NEGATIVE Final    Comment: (NOTE) If result is NEGATIVE SARS-CoV-2 target nucleic acids are NOT DETECTED. The SARS-CoV-2 RNA is generally detectable in upper and lower  respiratory specimens during the acute phase of infection. The  lowest  concentration of SARS-CoV-2 viral copies this assay can detect is 250  copies / mL. A negative result does not preclude SARS-CoV-2 infection  and should not be used as the sole basis for treatment or other  patient management decisions.  A negative result may occur with  improper specimen collection / handling, submission of specimen other  than nasopharyngeal swab, presence of viral mutation(s) within the  areas targeted by this assay, and inadequate number of viral copies  (<250 copies / mL). A negative result must be combined with clinical  observations, patient history, and epidemiological information. If result is POSITIVE SARS-CoV-2 target nucleic acids are DETECTED. The SARS-CoV-2 RNA is generally detectable in  upper and lower  respiratory specimens dur ing the acute phase of infection.  Positive  results are indicative of active infection with SARS-CoV-2.  Clinical  correlation with patient history and other diagnostic information is  necessary to determine patient infection status.  Positive results do  not rule out bacterial infection or co-infection with other viruses. If result is PRESUMPTIVE POSTIVE SARS-CoV-2 nucleic acids MAY BE PRESENT.   A presumptive positive result was obtained on the submitted specimen  and confirmed on repeat testing.  While 2019 novel coronavirus  (SARS-CoV-2) nucleic acids may be present in the submitted sample  additional confirmatory testing may be necessary for epidemiological  and / or clinical management purposes  to differentiate between  SARS-CoV-2 and other Sarbecovirus currently known to infect humans.  If clinically indicated additional testing with an alternate test  methodology [phone]) is advised. The SARS-CoV-2 RNA is generally  detectable in upper and lower respiratory sp ecimens during the acute  phase of infection. The expected result is Negative. Fact Sheet for Patients:  BoilerBrush.com.cyhttps://www.fda.gov/media/136312/download Fact Sheet for Healthcare Providers: https://pope.com/https://www.fda.gov/media/136313/download This test is not yet approved or cleared by the [place] FDA and has been authorized for detection and/or diagnosis of SARS-CoV-2 by FDA under an Emergency Use Authorization (EUA).  This EUA will remain in effect (meaning this test can be used) for the duration of the COVID-19 declaration under Section 564(b)(1) of the Act, 21 U.S.C. section 360bbb-3(b)(1), unless the authorization is terminated or revoked sooner. Performed at [facility], [address] [place], [place] [address]   SARS Coronavirus 2 (CEPHEID - Performed in [facility] lab), Hosp Order     Status: None   Collection Time: 06/21/19  9:21 PM    Specimen: Nasopharyngeal Swab  Result Value Ref Range Status   SARS Coronavirus 2 NEGATIVE NEGATIVE Final    Comment: (NOTE) If result is NEGATIVE SARS-CoV-2 target nucleic acids are NOT DETECTED. The SARS-CoV-2 RNA is generally detectable in upper and lower  respiratory specimens during the acute phase of infection. The lowest  concentration of SARS-CoV-2 viral copies this assay can detect is 250  copies / mL. A negative result does not preclude SARS-CoV-2 infection  and should not be used as the sole basis for treatment or other  patient management decisions.  A negative result may occur with  improper specimen collection / handling, submission of specimen other  than nasopharyngeal swab, presence of viral mutation(s) within the  areas targeted by this assay, and inadequate number of viral copies  (<250 copies / mL). A negative result must be combined with clinical  observations, patient history, and epidemiological information. If result is POSITIVE SARS-CoV-2 target nucleic acids are DETECTED. The SARS-CoV-2 RNA is generally detectable in upper and lower  respiratory specimens dur ing  the acute phase of infection.  Positive  results are indicative of active infection with SARS-CoV-2.  Clinical  correlation with patient history and other diagnostic information is  necessary to determine patient infection status.  Positive results do  not rule out bacterial infection or co-infection with other viruses. If result is PRESUMPTIVE POSTIVE SARS-CoV-2 nucleic acids MAY BE PRESENT.   A presumptive positive result was obtained on the submitted specimen  and confirmed on repeat testing.  While 2019 novel coronavirus  (SARS-CoV-2) nucleic acids may be present in the submitted sample  additional confirmatory testing may be necessary for epidemiological  and / or clinical management purposes  to differentiate between  SARS-CoV-2 and other Sarbecovirus currently known to infect humans.  If  clinically indicated additional testing with an alternate test  methodology [phone]) is advised. The SARS-CoV-2 RNA is generally  detectable in upper and lower respiratory sp ecimens during the acute  phase of infection. The expected result is Negative. Fact Sheet for Patients:  BoilerBrush.com.cy Fact Sheet for Healthcare Providers: https://pope.com/ This test is not yet approved or cleared by the [place] FDA and has been authorized for detection and/or diagnosis of SARS-CoV-2 by FDA under an Emergency Use Authorization (EUA).  This EUA will remain in effect (meaning this test can be used) for the duration of the COVID-19 declaration under Section 564(b)(1) of the Act, 21 U.S.C. section 360bbb-3(b)(1), unless the authorization is terminated or revoked sooner. Performed at [facility], [address], [place], [place] [address]   MRSA PCR Screening     Status: None   Collection Time: 06/21/19 10:59 PM   Specimen: Nasopharyngeal Wash  Result Value Ref Range Status   MRSA by PCR NEGATIVE NEGATIVE Final    Comment:        The GeneXpert MRSA Assay (FDA approved for NASAL specimens only), is one component of a comprehensive MRSA colonization surveillance program. It is not intended to diagnose MRSA infection nor to guide or monitor treatment for MRSA infections. Performed at [facility], [address] [place], [place] [address]      Labs: Basic Metabolic Panel: Recent Labs  Lab 06/21/19 1840 06/21/19 1852 06/22/19 0225  NA 140  --  139  K 3.5  --  3.8  CL 105  --  110  CO2 24  --  20*  GLUCOSE 84  --  63*  BUN 15  --  13  CREATININE 1.11  --  0.86  CALCIUM 9.9  --  8.8*  MG  --  2.1  --   PHOS  --  2.2*  --    Liver Function Tests: Recent Labs  Lab 06/21/19 1840 06/22/19 0225  AST 27 21  ALT 25 20  ALKPHOS 162* 125  BILITOT 0.8 0.5  PROT 8.8* 6.5  ALBUMIN 5.0  3.7   No results for input(s): LIPASE, AMYLASE in the last 168 hours. No results for input(s): AMMONIA in the last 168 hours. CBC: Recent Labs  Lab 06/21/19 1840 06/22/19 0225  WBC 6.1 9.9  HGB 15.1 13.0  HCT 47.5 40.3  MCV 87.5 89.2  PLT 408* 357   Cardiac Enzymes: No results for input(s): CKTOTAL, CKMB, CKMBINDEX, TROPONINI in the last 168 hours. BNP: BNP (last 3 results) Recent Labs    11/12/18 0529 11/19/18 0819  BNP 15.0 10.2    ProBNP (last 3 results) No results for input(s): PROBNP in the last 8760 hours.  CBG: Recent Labs  Lab  06/23/19 0918 06/23/19 1051 06/23/19 1152 06/23/19 1320 06/23/19 1420  GLUCAP 138* 145* 135* 117* 95    Principal Problem:   MDD (major depressive disorder), recurrent severe, without psychosis ([place]) Active Problems:   DM (diabetes mellitus), type 2, uncontrolled ([facility])   Depression   GERD (gastroesophageal reflux disease)   CAD (coronary artery disease)   Hyperlipidemia   Chronic pain syndrome   Hypoglycemia   Deliberate medication overdose, initial encounter ([place])   Time coordinating discharge: 38 minutes.  Signed:        Aeralyn Barna, DO [facility]  06/24/2019, 3:44 pm

## 2019-06-23 NOTE — Progress Notes (Signed)
Poison Control called and was updated

## 2019-06-23 NOTE — Progress Notes (Signed)
Hypoglycemic Event  CBG: 59  Treatment: 8 oz full sugar ginger ale  Symptoms: none  Follow-up CBG: Time: 16:30 CBG Result: 81  Possible Reasons for Event: insulin overdose  Comments/MD notified: Swayze, MD notified  Cancelling discharge orders at this time, restarted on D10 drip at 18mL/hour.    Arthor Captain [place]

## 2019-06-23 NOTE — Progress Notes (Addendum)
Hypoglycemic Event  CBG: 61  Treatment: 2 apple juice  Symptoms: none  Follow-up CBG: Time: 08:40 CBG Result: 52  Treatment: 25 mg D50 IV  Follow-up CBG: Time: 09:15 CBG Result: 138  Possible Reasons for Event: Insulin overdose  Comments/MD notified: Swayze, MD notified on rounds.    Arthor Captain [place]

## 2019-06-23 NOTE — TOC Transition Note (Signed)
Transition of Care [facility]) - CM/SW Discharge Note   Patient Details  Name: [name] [name] MRN: [id number] Date of Birth: [birth year]  [facility] CM/SW Contact:  Cleaster Shiffer, Marjie Skiff, RN Phone Number: 06/23/2019, 4:11 PM   Clinical Narrative:     Voluntary consent for treatment form for [facility] and [place] signed by pt and placed on the shadow chart. [facility] alerted of need for inpt psych bed. Pt just had low blood sugar reading per staff RN so dc cancelled. [place] made aware. Marney Doctor RN,BSN [phone]

## 2019-06-24 ENCOUNTER — Inpatient Hospital Stay (HOSPITAL_COMMUNITY)
Admission: AD | Admit: 2019-06-24 | Discharge: 2019-06-30 | DRG: 885 | Disposition: A | Discharge status: Home / Self Care | Payer: Medicare HMO | Source: Intra-hospital | Attending: Psychiatry | Admitting: Psychiatry

## 2019-06-24 DIAGNOSIS — F411 Generalized anxiety disorder: Secondary | ICD-10-CM | POA: Diagnosis present

## 2019-06-24 DIAGNOSIS — E782 Mixed hyperlipidemia: Secondary | ICD-10-CM | POA: Diagnosis not present

## 2019-06-24 DIAGNOSIS — I252 Old myocardial infarction: Secondary | ICD-10-CM

## 2019-06-24 DIAGNOSIS — G8929 Other chronic pain: Secondary | ICD-10-CM | POA: Diagnosis present

## 2019-06-24 DIAGNOSIS — E1142 Type 2 diabetes mellitus with diabetic polyneuropathy: Secondary | ICD-10-CM | POA: Diagnosis present

## 2019-06-24 DIAGNOSIS — E162 Hypoglycemia, unspecified: Secondary | ICD-10-CM | POA: Diagnosis not present

## 2019-06-24 DIAGNOSIS — Z590 Homelessness: Secondary | ICD-10-CM

## 2019-06-24 DIAGNOSIS — E785 Hyperlipidemia, unspecified: Secondary | ICD-10-CM | POA: Diagnosis present

## 2019-06-24 DIAGNOSIS — Z794 Long term (current) use of insulin: Secondary | ICD-10-CM

## 2019-06-24 DIAGNOSIS — E1165 Type 2 diabetes mellitus with hyperglycemia: Secondary | ICD-10-CM | POA: Diagnosis not present

## 2019-06-24 DIAGNOSIS — F1210 Cannabis abuse, uncomplicated: Secondary | ICD-10-CM | POA: Diagnosis present

## 2019-06-24 DIAGNOSIS — E160 Drug-induced hypoglycemia without coma: Secondary | ICD-10-CM | POA: Diagnosis not present

## 2019-06-24 DIAGNOSIS — Z808 Family history of malignant neoplasm of other organs or systems: Secondary | ICD-10-CM | POA: Diagnosis not present

## 2019-06-24 DIAGNOSIS — Z79899 Other long term (current) drug therapy: Secondary | ICD-10-CM

## 2019-06-24 DIAGNOSIS — I2510 Atherosclerotic heart disease of native coronary artery without angina pectoris: Secondary | ICD-10-CM | POA: Diagnosis not present

## 2019-06-24 DIAGNOSIS — R45851 Suicidal ideations: Secondary | ICD-10-CM | POA: Diagnosis present

## 2019-06-24 DIAGNOSIS — G894 Chronic pain syndrome: Secondary | ICD-10-CM | POA: Diagnosis not present

## 2019-06-24 DIAGNOSIS — I10 Essential (primary) hypertension: Secondary | ICD-10-CM | POA: Diagnosis present

## 2019-06-24 DIAGNOSIS — F332 Major depressive disorder, recurrent severe without psychotic features: Secondary | ICD-10-CM | POA: Diagnosis present

## 2019-06-24 DIAGNOSIS — K219 Gastro-esophageal reflux disease without esophagitis: Secondary | ICD-10-CM | POA: Diagnosis not present

## 2019-06-24 DIAGNOSIS — E7800 Pure hypercholesterolemia, unspecified: Secondary | ICD-10-CM | POA: Diagnosis present

## 2019-06-24 DIAGNOSIS — Z833 Family history of diabetes mellitus: Secondary | ICD-10-CM | POA: Diagnosis not present

## 2019-06-24 DIAGNOSIS — Z62811 Personal history of psychological abuse in childhood: Secondary | ICD-10-CM | POA: Diagnosis present

## 2019-06-24 DIAGNOSIS — G63 Polyneuropathy in diseases classified elsewhere: Secondary | ICD-10-CM

## 2019-06-24 DIAGNOSIS — R69 Illness, unspecified: Secondary | ICD-10-CM | POA: Diagnosis not present

## 2019-06-24 DIAGNOSIS — Z87891 Personal history of nicotine dependence: Secondary | ICD-10-CM

## 2019-06-24 DIAGNOSIS — Z8249 Family history of ischemic heart disease and other diseases of the circulatory system: Secondary | ICD-10-CM | POA: Diagnosis not present

## 2019-06-24 DIAGNOSIS — Z62810 Personal history of physical and sexual abuse in childhood: Secondary | ICD-10-CM | POA: Diagnosis present

## 2019-06-24 DIAGNOSIS — G4700 Insomnia, unspecified: Secondary | ICD-10-CM | POA: Diagnosis present

## 2019-06-24 DIAGNOSIS — F339 Major depressive disorder, recurrent, unspecified: Secondary | ICD-10-CM | POA: Diagnosis not present

## 2019-06-24 LAB — GLUCOSE, CAPILLARY
Glucose-Capillary: 93 mg/dL (ref 70–99)
Glucose-Capillary: 82 mg/dL (ref 70–99)
Glucose-Capillary: 103 mg/dL — ABNORMAL HIGH (ref 70–99)
Glucose-Capillary: 211 mg/dL — ABNORMAL HIGH (ref 70–99)
Glucose-Capillary: 264 mg/dL — ABNORMAL HIGH (ref 70–99)
Glucose-Capillary: 224 mg/dL — ABNORMAL HIGH (ref 70–99)
Glucose-Capillary: 240 mg/dL — ABNORMAL HIGH (ref 70–99)
Glucose-Capillary: 289 mg/dL — ABNORMAL HIGH (ref 70–99)
Glucose-Capillary: 314 mg/dL — ABNORMAL HIGH (ref 70–99)
Glucose-Capillary: 331 mg/dL — ABNORMAL HIGH (ref 70–99)
Glucose-Capillary: 233 mg/dL — ABNORMAL HIGH (ref 70–99)

## 2019-06-24 MED ORDER — ACETAMINOPHEN 325 MG PO TABS
650.0000 mg | ORAL_TABLET | Freq: Four times a day (QID) | ORAL | Status: DC | PRN
  Administered 2019-06-26: 650 mg via ORAL
  Filled 2019-06-24: qty 2

## 2019-06-24 MED ORDER — BUPROPION HCL ER (XL) 150 MG PO TB24
150.0000 mg | ORAL_TABLET | Freq: Every day | ORAL | Status: DC
  Administered 2019-06-25: 150 mg via ORAL
  Filled 2019-06-24 (×2): qty 1

## 2019-06-24 MED ORDER — METOPROLOL TARTRATE 25 MG PO TABS
25.0000 mg | ORAL_TABLET | Freq: Two times a day (BID) | ORAL | Status: DC
  Administered 2019-06-24: 25 mg via ORAL
  Filled 2019-06-24: qty 1

## 2019-06-24 MED ORDER — HYDROCORTISONE 0.5 % EX CREA
TOPICAL_CREAM | Freq: Two times a day (BID) | CUTANEOUS | Status: DC | PRN
  Administered 2019-06-25: 1 via TOPICAL
  Filled 2019-06-24: qty 28.35

## 2019-06-24 MED ORDER — METOPROLOL TARTRATE 25 MG PO TABS
25.0000 mg | ORAL_TABLET | Freq: Two times a day (BID) | ORAL | Status: DC
  Administered 2019-06-24 – 2019-06-30 (×12): 25 mg via ORAL
  Filled 2019-06-24 (×17): qty 1

## 2019-06-24 MED ORDER — MAGNESIUM HYDROXIDE 400 MG/5ML PO SUSP: 30.0000 mL | Freq: Every day | ORAL | Status: DC | PRN

## 2019-06-24 MED ORDER — INSULIN ASPART 100 UNIT/ML ~~LOC~~ SOLN: 0.0000 [IU] | Freq: Three times a day (TID) | SUBCUTANEOUS | Status: DC

## 2019-06-24 MED ORDER — ASPIRIN 81 MG PO TBEC
81.0000 mg | DELAYED_RELEASE_TABLET | Freq: Every day | ORAL | Status: DC
  Administered 2019-06-25 – 2019-06-30 (×6): 81 mg via ORAL
  Filled 2019-06-24 (×8): qty 1

## 2019-06-24 MED ORDER — HYDROCORTISONE 0.5 % EX CREA
TOPICAL_CREAM | Freq: Two times a day (BID) | CUTANEOUS | Status: DC
  Administered 2019-06-24: 14:00:00 via TOPICAL
  Filled 2019-06-24: qty 28.35

## 2019-06-24 MED ORDER — INSULIN ASPART 100 UNIT/ML ~~LOC~~ SOLN
14.0000 [IU] | Freq: Once | SUBCUTANEOUS | Status: AC
  Administered 2019-06-24: 14 [IU] via SUBCUTANEOUS

## 2019-06-24 MED ORDER — HYDROXYZINE HCL 25 MG PO TABS
25.0000 mg | ORAL_TABLET | Freq: Three times a day (TID) | ORAL | Status: DC | PRN
  Filled 2019-06-24: qty 1

## 2019-06-24 MED ORDER — GABAPENTIN 100 MG PO CAPS
200.0000 mg | ORAL_CAPSULE | Freq: Two times a day (BID) | ORAL | Status: DC
  Administered 2019-06-24 – 2019-06-26 (×4): 200 mg via ORAL
  Filled 2019-06-24 (×7): qty 2

## 2019-06-24 MED ORDER — LISINOPRIL 10 MG PO TABS
10.0000 mg | ORAL_TABLET | Freq: Every day | ORAL | Status: DC
  Administered 2019-06-25 – 2019-06-28 (×4): 10 mg via ORAL
  Filled 2019-06-24 (×6): qty 1

## 2019-06-24 MED ORDER — HYDROCORTISONE 0.5 % EX CREA: TOPICAL_CREAM | Freq: Two times a day (BID) | CUTANEOUS | 0 refills | Status: DC

## 2019-06-24 MED ORDER — ALUM & MAG HYDROXIDE-SIMETH 200-200-20 MG/5ML PO SUSP: 30.0000 mL | ORAL | Status: DC | PRN

## 2019-06-24 MED ORDER — ROSUVASTATIN CALCIUM 40 MG PO TABS
40.0000 mg | ORAL_TABLET | Freq: Every day | ORAL | Status: DC
  Administered 2019-06-25 – 2019-06-30 (×6): 40 mg via ORAL
  Filled 2019-06-24 (×8): qty 1

## 2019-06-24 MED ORDER — PANTOPRAZOLE SODIUM 40 MG PO TBEC
40.0000 mg | DELAYED_RELEASE_TABLET | Freq: Every day | ORAL | Status: DC
  Administered 2019-06-24 – 2019-06-30 (×7): 40 mg via ORAL
  Filled 2019-06-24 (×10): qty 1

## 2019-06-24 MED ORDER — INSULIN DETEMIR 100 UNIT/ML ~~LOC~~ SOLN
20.0000 [IU] | Freq: Two times a day (BID) | SUBCUTANEOUS | Status: DC
  Administered 2019-06-24 – 2019-06-26 (×4): 20 [IU] via SUBCUTANEOUS
  Filled 2019-06-24: qty 0.2

## 2019-06-24 MED ORDER — HYDROCODONE-ACETAMINOPHEN 7.5-325 MG PO TABS: 1.0000 | ORAL_TABLET | Freq: Four times a day (QID) | ORAL | Status: DC | PRN

## 2019-06-24 NOTE — Progress Notes (Signed)
[name] [name] [id number] d/c to [facility] by [employer].

## 2019-06-24 NOTE — Progress Notes (Signed)
2nd shift CSW received a handoff from the 1st shift [facility] CSW.   CSW received a call from pt's RN asking about pt's imminent transfer to [facility].  CSW reviewed pt's chart and notes pt was set to be referred to [facility] on 7/24 but blood sugar was low so D/C was cancelled.  CSW called [facility] who placed pt back on referral list.  CSW updated pt's RN who will contact provider to insure pt has no barriers to D/C today.  RN will call and updated CSW.  CSW will continue to follow for D/C needs.  Alphonse Guild. Julieanna Geraci, LCSW, LCAS, CSI Transitions of Care Clinical Social Worker Care Coordination Department Ph: [phone]

## 2019-06-24 NOTE — Progress Notes (Signed)
PROGRESS NOTE  [name] [name] VPX:[id number] DOB: 09-09-1956 DOA: 06/21/2019 PCP: [name] Knapp, MD  Brief History   The patient is a 63 yr old man who was discharged from [facility] on the morning of 06/21/2019. He presented back to [facility] ED last night lethargic. He admitted to taking overdoses of metoprolol, Detemir insulin, and possibly methacarbamol. He has been admitted to a telemetry bed, and he has been on D10 gtt. Despite this the patient has been hypoglycemic this afternoon, and required an increased rate of D10.   Psychiatry has been consulted and has accepted the patient to return to inpatient psychiatric treatment when he is medically cleared for discharge. Unfortunately, the patient dropped his glucose into the 50's again once D10 was stopped today. Discharge to behavioral health was cancelled.  Consultants  . Psychiatry  Procedures  . None  Antibiotics   Anti-infectives (From admission, onward)   None      Subjective  The patient is resting comfortably. He states that he has no appetite.  Objective   Vitals:  Vitals:   06/24/19 1100 06/24/19 1200  BP: (!) 179/89   Pulse: 75 74  Resp: 15 (!) 22  Temp:  97.9 F (36.6 C)  SpO2: 97% 96%    Exam:  Constitutional:  . The patient is awake, alert, and oriented x 3. He has a very flat affect and isn't speaking much. Marland Kitchen  Respiratory:  . No increased work of breathing.  . No wheezes, rales, or rhonchi. . No tactile fremitus Cardiovascular:  . Regular rate and rhythm. . No murmurs, ectopy, or gallups are appreciated. . No lateral PMI. No thrills. Abdomen:  . Abdomen is soft, non-tender, non-distended. . No hernias, masses, or organomegaly are appreciated. . Normoactive bowel sounds are present. Musculoskeletal:  . No cyanosis, clubbing, or edema Skin:  . No rashes, lesions, ulcers . palpation of skin: no induration or nodules Neurologic:  . CN 2-12 intact . Sensation all 4 extremities intact Psychiatric:  . Mental  status o Mood: depressed. Affect: Falt o Orientation to person, place, time  . judgment and insight are not intact   I have personally reviewed the following:   Today's Data  . Vitals, glucoses, CMP, CBC  Scheduled Meds: . aspirin EC  81 mg Oral Daily  . buPROPion  150 mg Oral Daily  . Chlorhexidine Gluconate Cloth  6 each Topical Daily  . enoxaparin (LOVENOX) injection  40 mg Subcutaneous QHS  . gabapentin  200 mg Oral BID  . hydrocortisone cream   Topical BID  . lisinopril  10 mg Oral Daily  . mouth rinse  15 mL Mouth Rinse BID  . metoprolol tartrate  25 mg Oral BID  . pantoprazole  40 mg Oral Daily  . rosuvastatin  40 mg Oral q1800  . sodium chloride flush  3 mL Intravenous Q12H   Continuous Infusions: . sodium chloride    . dextrose Stopped (06/24/19 0930)    Principal Problem:   MDD (major depressive disorder), recurrent severe, without psychosis ([place]) Active Problems:   DM (diabetes mellitus), type 2, uncontrolled ([place])   Depression   GERD (gastroesophageal reflux disease)   CAD (coronary artery disease)   Hyperlipidemia   Chronic pain syndrome   Hypoglycemia   Deliberate medication overdose, initial encounter ([place])   LOS: 3 days    A & P   Suicidality: Patient has been accepted to return to [facility] for continued inpatient psychiatric treatment when medically cleared. I appreciate psychiatry's  assistance.  Deliberate medication overdose: Pt is being monitored on telemetry. Glucoses will be monitored frequently. Lethargy is clearing.  Hypoglycemia: Patient took an overdose of Detemir, he now states that he thinks that he took 5 boxes of detemir pens full of insulin. If so, this would be 2500 units. This is peaking and requiring an increased rate of D10 infusion. Glucoses will be monitored frequently.  DM II, Uncontrolled: The patient is on a carbohydrate controlled diet, but he is not eating much. Blood sugars are supported by D 10.  GERD: Continue pantoprazole  40 mg daily.  CAD: Continue ASA 81 mg and crestor. Metoprolol is held due to intentional overdose. Monitor on telemetry.  Hyperlipidemia: Continue crestor 40 mg as at home.  Chronic pain syndrome: Continue gabapentin and Norco.  I have seen and examined this patient myself. I have spent 42 minute in his evaluation and care.  DVT prophylaxis: Lovenox Code Status: Full Code Family Communication: None available. Disposition Plan: Discharge to [facility] when blood sugars are stable off of dextrose drip.  Ashlay Altieri, DO [facility] Direct contact: see [email or web address]  7PM-7AM contact night coverage as above 06/23/2019, 3:22 PM  LOS: 1 day

## 2019-06-24 NOTE — Progress Notes (Signed)
CSW received a call from pt's RN stating pt is now, per provider, safe and ready for D/C.  CSW called [facility] and was informed that pt has been accepted to [facility] and can arrive today (7/25) at or after 5pm and will be going to Room 404 Bed 2 and # for report is at ph: [phone].  CSW called [employer] who stated they will be at the main entrance to [employer] (where parking is) to pick up the pt (logo on the side of the minivan).  Pt is voluntary and a call will need to be placed to [facility] Transportation at ph: [phone] if there are any changes or they aren't found at the front entrance.  Alphonse Guild. Mikiah Durall, LCSW, LCAS, CSI Transitions of Care Clinical Social Worker Care Coordination Department Ph: [phone]
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
## 2019-06-25 ENCOUNTER — Other Ambulatory Visit: Payer: Self-pay

## 2019-06-25 ENCOUNTER — Encounter (HOSPITAL_COMMUNITY): Payer: Self-pay

## 2019-06-25 DIAGNOSIS — F332 Major depressive disorder, recurrent severe without psychotic features: Principal | ICD-10-CM

## 2019-06-25 LAB — GLUCOSE, CAPILLARY
Glucose-Capillary: 79 mg/dL (ref 70–99)
Glucose-Capillary: 343 mg/dL — ABNORMAL HIGH (ref 70–99)
Glucose-Capillary: 373 mg/dL — ABNORMAL HIGH (ref 70–99)
Glucose-Capillary: 374 mg/dL — ABNORMAL HIGH (ref 70–99)

## 2019-06-25 LAB — HEMOGLOBIN A1C
Hgb A1c MFr Bld: 9.7 % — ABNORMAL HIGH (ref 4.8–5.6)
Mean Plasma Glucose: 231.69 mg/dL

## 2019-06-25 MED ORDER — INSULIN ASPART 100 UNIT/ML ~~LOC~~ SOLN
0.0000 [IU] | Freq: Three times a day (TID) | SUBCUTANEOUS | Status: DC
  Administered 2019-06-25: 9 [IU] via SUBCUTANEOUS
  Administered 2019-06-25: 7 [IU] via SUBCUTANEOUS
  Administered 2019-06-26: 1 [IU] via SUBCUTANEOUS
  Administered 2019-06-26 (×2): 7 [IU] via SUBCUTANEOUS
  Administered 2019-06-27: 3 [IU] via SUBCUTANEOUS
  Administered 2019-06-27: 2 [IU] via SUBCUTANEOUS
  Administered 2019-06-27: 5 [IU] via SUBCUTANEOUS
  Administered 2019-06-28: 7 [IU] via SUBCUTANEOUS
  Administered 2019-06-28: 1 [IU] via SUBCUTANEOUS
  Administered 2019-06-28: 3 [IU] via SUBCUTANEOUS
  Administered 2019-06-29: 2 [IU] via SUBCUTANEOUS
  Administered 2019-06-29: 1 [IU] via SUBCUTANEOUS
  Administered 2019-06-29 – 2019-06-30 (×2): 3 [IU] via SUBCUTANEOUS

## 2019-06-25 MED ORDER — INSULIN ASPART 100 UNIT/ML ~~LOC~~ SOLN: 0.0000 [IU] | Freq: Three times a day (TID) | SUBCUTANEOUS | Status: DC

## 2019-06-25 MED ORDER — DULOXETINE HCL 30 MG PO CPEP
30.0000 mg | ORAL_CAPSULE | Freq: Every day | ORAL | Status: DC
  Administered 2019-06-26: 30 mg via ORAL
  Filled 2019-06-25 (×2): qty 1

## 2019-06-25 MED ORDER — HYDROCODONE-ACETAMINOPHEN 5-325 MG PO TABS
1.0000 | ORAL_TABLET | Freq: Four times a day (QID) | ORAL | Status: DC | PRN
  Administered 2019-06-25 – 2019-06-30 (×14): 1 via ORAL
  Filled 2019-06-25 (×14): qty 1

## 2019-06-25 MED ORDER — INSULIN ASPART 100 UNIT/ML ~~LOC~~ SOLN
0.0000 [IU] | Freq: Every day | SUBCUTANEOUS | Status: DC
  Administered 2019-06-25: 5 [IU] via SUBCUTANEOUS
  Administered 2019-06-26: 4 [IU] via SUBCUTANEOUS
  Administered 2019-06-27: 3 [IU] via SUBCUTANEOUS
  Administered 2019-06-28: 21:00:00 5 [IU] via SUBCUTANEOUS
  Administered 2019-06-29: 21:00:00 2 [IU] via SUBCUTANEOUS

## 2019-06-25 NOTE — [facility] Group Notes (Signed)
[facility] Group Notes:  (Nursing/MHT/Case Management/Adjunct)  Date:  06/25/2019  Time:  1:41 PM  Type of Therapy:  Psychoeducational Skills  Participation Level:  Did Not Attend    Coralyn Mark Trevone Prestwood 06/25/2019, 1:41 PM

## 2019-06-25 NOTE — Progress Notes (Signed)
D: Pt denies SI/HI/AVH. Pt is pleasant and cooperative. Pt visible in the dayroom this evening. Pt visibly sad, but seen interacting with peers at times.   A: Pt was offered support and encouragement. Pt was given scheduled medications. Pt was encourage to attend groups. Q 15 minute checks were done for safety.   R:Pt attends groups and interacts  with peers and staff. Pt is taking medication. Pt has no complaints.Pt receptive to treatment and safety maintained on unit.

## 2019-06-25 NOTE — Progress Notes (Signed)
Adult Psychoeducational Group Note  Date:  06/25/2019 Time:  10:53 PM  Group Topic/Focus:  Wrap-Up Group:   The focus of this group is to help patients review their daily goal of treatment and discuss progress on daily workbooks.  Participation Level:  Active  Participation Quality:  Appropriate  Affect:  Appropriate  Cognitive:  Appropriate  Insight: Appropriate  Engagement in Group:  Engaged  Modes of Intervention:  Discussion  Additional Comments:  Pt attended group and said that his day was a 33. His goal for today was to speak to the physician and his goal was met.   Draeden Kellman W Masa Lubin 7/98/1025, 10:53 PM

## 2019-06-25 NOTE — H&P (Addendum)
Psychiatric Admission Assessment Adult  Patient Identification: [name] DamesKevin J Embree MRN:  [id number] Date of Evaluation:  06/25/2019 Chief Complaint:  " Down and out" Principal Diagnosis: MDD, No Psychotic Features, S/P Overdose  Diagnosis:  Active Problems:   MDD (major depressive disorder), recurrent episode, severe (HCC)  History of Present Illness: 63 year old male. [facility] from 7/17 through 06/21/2019. Had been admitted due to worsening depression, suicidal ideations in the context of severe stressors ( separation, wife and children had moved to [place], homelessness ). At the time was discharged on Wellbutrin XL 150 mgrs QDAY, Neurontin 200 mgrs BID.  Patient returned to  ED later the same day reporting suicidal ideations and overdose on meloxicam, metoprolol, Insulin.  Was admitted to medical unit for hypoglycemia, and [facility] . *Discharge medications - Neurontin 200 mgrs BID, Wellbutrin XL 150 mgrs QDAY, Norco 7.5 mgrs Q 6 hours PRN for pain, Insuline Levemir 30 units BID, Novolog  Sliding scale ,Lisinopril 10 mgrs  QDAY, Lopressor 25 mgrs BID, Crestor 40 mgrs QDAY, Protonix 40 mgrs QDAY    Associated Signs/Symptoms: Depression Symptoms:  depressed mood, anhedonia, suicidal attempt, anxiety, loss of energy/fatigue, decreased appetite, (Hypo) Manic Symptoms:  None noted or reported Anxiety Symptoms:  Reports some increased anxiety in the context of stressors Psychotic Symptoms: denies  PTSD Symptoms: denies   Total Time spent with patient: 45 minutes  Past Psychiatric History: history of prior psychiatric admissions, most recently earlier in July. Reports long history of depression, exacerbated by severe psychosocial stressors. Currently does not endorse history of mania or hypomania. Denies history of psychosis.  Is the patient at risk to self? Yes.    Has the patient been a risk to self in the past 6 months? Yes.    Has the  patient been a risk to self within the distant past? No.  Is the patient a risk to others? No.  Has the patient been a risk to others in the past 6 months? No.  Has the patient been a risk to others within the distant past? No.   Prior Inpatient Therapy:  as above  Prior [facility]  [facility]  Alcohol Screening: 1. How often do you have a drink containing alcohol?: Never 2. How many drinks containing alcohol do you have on a typical day when you are drinking?: 1 or 2 3. How often do you have six or more drinks on one occasion?: Never AUDIT-C Score: 0 4. How often during the last year have you found that you were not able to stop drinking once you had started?: Never 5. How often during the last year have you failed to do what was normally expected from you becasue of drinking?: Never 6. How often during the last year have you needed a first drink in the morning to get yourself going after a heavy drinking session?: Never 7. How often during the last year have you had a feeling of guilt of remorse after drinking?: Never 8. How often during the last year have you been unable to remember what happened the night before because you had been drinking?: Never 9. Have you or someone else been injured as a result of your drinking?: No 10. Has a relative or friend or a doctor or another health worker been concerned about your drinking or suggested you cut down?: No Alcohol Use Disorder Identification Test Final Score (AUDIT): 0 Alcohol Brief Interventions/Follow-up: AUDIT Score <7  follow-up not indicated Substance Abuse History in the last 12 months: Denies alcohol or drug abuse . He is prescribed opiate analgesics for chronic pain, denies abuse or misuse. Consequences of Substance Abuse: Denies  Previous Psychotropic Medications: Was recently discharged on Wellbutrin XL 150 mgrs QDAY- was started on it a few weeks ago, but has been on it in the past as well , Neurontin mgr  BID Remembers having been on Zoloft, Prozac in the past .  Psychological Evaluations:  No  Past Medical History: history of CAD, HTN, DM II, chronic pain ( mainly hip) , Peripheral Neuropathy. Denies history of seizures . Past Medical History:  Diagnosis Date  . Barrett's esophagus   . CAD (coronary artery disease)    a. NSTEMI 05/2014 - occluded RCA dominant proximal s/p asp-thrombectomy/DES to RCA, minimal LAD/LCx, EF 50% by cath, 65-70% by echo.  . Depression   . Diabetes type 2, uncontrolled ([place])   . Former tobacco use   . GERD (gastroesophageal reflux disease)   . Hemorrhoids    hx of  . Hypercholesterolemia   . Hypertension   . MI (myocardial infarction) ([place])   . Peripheral neuropathy     Past Surgical History:  Procedure Laterality Date  . LEFT HEART CATHETERIZATION WITH CORONARY ANGIOGRAM N/A 06/14/2014   Procedure: LEFT HEART CATHETERIZATION WITH CORONARY ANGIOGRAM;  Surgeon: Jettie Booze, MD;  Location: [facility] CATH LAB;  Service: Cardiovascular;  Laterality: N/A;  . TONSILLECTOMY     Family History: parents deceased . Both father and his brother deceased from pharyngeal cancer. Family History  Problem Relation Age of Onset  . Hypertension Mother   . Alzheimer's disease Mother   . Alcohol abuse Father   . Cancer Father 77       "Throat"  . Diabetes type II Brother   . Cancer Brother 68       throat cancer  . Heart disease Neg Hx    Family Psychiatric  History: Father was alcoholic. No suicides in family  Tobacco Screening: Have you used any form of tobacco in the last 30 days? (Cigarettes, Smokeless Tobacco, Cigars, and/or Pipes): No Social History: 44, currently separated, homeless , on disability,reports wife and daughter are in [place], has 4 adult children Social History   Substance and Sexual Activity  Alcohol Use No  . Alcohol/week: 1.0 standard drinks  . Types: 1 Standard drinks or equivalent per week     Social History   Substance and Sexual Activity   Drug Use No    Additional Social History:  Allergies:  No Known Allergies Lab Results:  Results for orders placed or performed during the hospital encounter of 06/24/19 (from the past 48 hour(s))  Glucose, capillary     Status: Abnormal   Collection Time: 06/24/19  8:45 PM  Result Value Ref Range   Glucose-Capillary 233 (H) 70 - 99 mg/dL  Glucose, capillary     Status: None   Collection Time: 06/25/19  6:01 AM  Result Value Ref Range   Glucose-Capillary 79 70 - 99 mg/dL    Blood Alcohol level:  Lab Results  Component Value Date   ETH <10 06/21/2019   ETH <10 09/23/8526    Metabolic Disorder Labs:  Lab Results  Component Value Date   HGBA1C 9.6 (H) 06/16/2019   MPG 228.82 06/16/2019   MPG 240 01/14/2019   No results found for: PROLACTIN Lab Results  Component Value Date   CHOL 263 (H) 06/16/2019   TRIG 376 (  H) 06/16/2019   HDL 37 (L) 06/16/2019   CHOLHDL 7.1 06/16/2019   VLDL 75 (H) 06/16/2019   LDLCALC 151 (H) 06/16/2019   LDLCALC 68 11/20/2018    Current Medications: Current Facility-Administered Medications  Medication Dose Route Frequency Provider Last Rate Last Dose  . acetaminophen (TYLENOL) tablet 650 mg  650 mg Oral Q6H PRN Maryagnes AmosStarkes-Perry, Takia S, FNP      . alum & mag hydroxide-simeth (MAALOX/MYLANTA) 200-200-20 MG/5ML suspension 30 mL  30 mL Oral Q4H PRN Rosario AdieStarkes-Perry, Juel Burrowakia S, FNP      . aspirin EC tablet 81 mg  81 mg Oral Daily Maryagnes AmosStarkes-Perry, Takia S, FNP   81 mg at 06/25/19 [id number]  . buPROPion (WELLBUTRIN XL) 24 hr tablet 150 mg  150 mg Oral Daily Maryagnes AmosStarkes-Perry, Takia S, FNP   150 mg at 06/25/19 [id number]  . gabapentin (NEURONTIN) capsule 200 mg  200 mg Oral BID Maryagnes AmosStarkes-Perry, Takia S, FNP   200 mg at 06/25/19 [id number]  . HYDROcodone-acetaminophen (NORCO) 7.5-325 MG per tablet 1 tablet  1 tablet Oral Q6H PRN Starkes-Perry, Juel Burrowakia S, FNP      . hydrocortisone cream 0.5 %   Topical BID PRN Kerry HoughSimon, Spencer E, PA-C   1 application at 06/25/19 0831  . hydrOXYzine  (ATARAX/VISTARIL) tablet 25 mg  25 mg Oral TID PRN Maryagnes AmosStarkes-Perry, Takia S, FNP      . insulin aspart (novoLOG) injection 0-15 Units  0-15 Units Subcutaneous TID AC Starkes-Perry, Juel Burrowakia S, FNP      . insulin detemir (LEVEMIR) injection 20 Units  20 Units Subcutaneous BID Maryagnes AmosStarkes-Perry, Takia S, FNP   20 Units at 06/25/19 [id number]  . lisinopril (ZESTRIL) tablet 10 mg  10 mg Oral Daily Maryagnes AmosStarkes-Perry, Takia S, FNP   10 mg at 06/25/19 [id number]  . magnesium hydroxide (MILK OF MAGNESIA) suspension 30 mL  30 mL Oral Daily PRN Maryagnes AmosStarkes-Perry, Takia S, FNP      . metoprolol tartrate (LOPRESSOR) tablet 25 mg  25 mg Oral BID Maryagnes AmosStarkes-Perry, Takia S, FNP   25 mg at 06/25/19 [id number]  . pantoprazole (PROTONIX) EC tablet 40 mg  40 mg Oral Daily Maryagnes AmosStarkes-Perry, Takia S, FNP   40 mg at 06/25/19 [id number]  . rosuvastatin (CRESTOR) tablet 40 mg  40 mg Oral Daily Maryagnes AmosStarkes-Perry, Takia S, FNP   40 mg at 06/25/19 [id number]   PTA Medications: Medications Prior to Admission  Medication Sig Dispense Refill Last Dose  . buPROPion (WELLBUTRIN XL) 150 MG 24 hr tablet Take 1 tablet (150 mg total) by mouth daily. 30 tablet 0   . calcium carbonate (TUMS EX) 750 MG chewable tablet Chew 2 tablets by mouth daily as needed for heartburn.      . EQ ASPIRIN ADULT LOW DOSE 81 MG EC tablet TAKE ONE TABLET BY MOUTH ONCE DAILY (Patient taking differently: Take 81 mg by mouth daily. ) 90 tablet 0   . gabapentin (NEURONTIN) 100 MG capsule Take 2 capsules (200 mg total) by mouth 2 (two) times daily. 120 capsule 0   . HYDROcodone-acetaminophen (NORCO) 7.5-325 MG tablet Take 1 tablet by mouth every 6 (six) hours as needed for moderate pain. 120 tablet 0   . hydrocortisone cream 0.5 % Apply topically 2 (two) times daily. 30 g 0   . insulin aspart (NOVOLOG) 100 UNIT/ML injection Inject 0-15 Units into the skin 3 (three) times daily before meals. 121-150=2 units, 151-200=4 units, 201-250=7 units, 251-300=9 units, 301-350=12 units, >351=15 units     . insulin detemir  (LEVEMIR) 100 UNIT/ML  injection Inject 0.2 mLs (20 Units total) into the skin 2 (two) times daily. 12 mL 0   . lisinopril (ZESTRIL) 10 MG tablet Take 1 tablet (10 mg total) by mouth daily. 30 tablet 0   . metoprolol tartrate (LOPRESSOR) 25 MG tablet Take 1 tablet (25 mg total) by mouth 2 (two) times daily. 60 tablet 0   . nitroGLYCERIN (NITROSTAT) 0.4 MG SL tablet Place 1 tablet (0.4 mg total) under the tongue every 5 (five) minutes as needed for chest pain (up to 3 doses). 25 tablet 0   . pantoprazole (PROTONIX) 40 MG tablet Take 1 tablet (40 mg total) by mouth daily. 90 tablet 0   . rosuvastatin (CRESTOR) 40 MG tablet TAKE 1 TABLET(40 MG) BY MOUTH DAILY (Patient taking differently: Take 40 mg by mouth daily. ) 90 tablet 0     Musculoskeletal: Strength & Muscle Tone: within normal limits Gait & Station: normal Patient leans: N/A  Psychiatric Specialty Exam: Physical Exam  Review of Systems  Constitutional: Negative.  Negative for chills and fever.  HENT: Negative.   Eyes: Negative.   Respiratory: Negative for cough and shortness of breath.   Cardiovascular: Negative for chest pain.  Gastrointestinal: Negative.  Negative for diarrhea, nausea and vomiting.  Genitourinary: Negative.   Musculoskeletal: Negative.        Reports chronic hip pain and peripheral neuropathy related pain  Skin: Negative.   Neurological: Negative for seizures.  Psychiatric/Behavioral: Positive for depression and suicidal ideas.    Blood pressure 134/90, pulse 81, temperature 98.1 F (36.7 C), resp. rate 20, height 5\' 9"  (1.753 m), weight 104.8 kg, SpO2 98 %.Body mass index is 34.11 kg/m.  General Appearance: Well Groomed  Eye Contact:  Fair  Speech:  Normal Rate  Volume:  Normal  Mood:  Depressed   Affect:  Constricted and Flat  Thought Process:  Linear and Descriptions of Associations: Intact  Orientation:  Full (Time, Place, and Person)  Thought Content:  no hallucinations, no delusions  Suicidal  Thoughts:  No  Denies current suicidal or self injurious ideations and contracts for safety, denies homicidal or violent ideations  Homicidal Thoughts:  No  Memory:  recent and remote grossly intact   Judgement:  Fair  Insight:  Fair  Psychomotor Activity:  Decreased  Concentration:  Concentration: Good and Attention Span: Good  Recall:  Good  Fund of Knowledge:  Good  Language:  Good  Akathisia:  Negative  Handed:  Right  AIMS (if indicated):     Assets:  Desire for Improvement Resilience  ADL's:  Intact  Cognition:  WNL  Sleep:  Number of Hours: 6.25    Treatment Plan Summary: Daily contact with patient to assess and evaluate symptoms and progress in treatment, Medication management, Plan inpatient treatment and medications as below  Observation Level/Precautions:  15 minute checks  Laboratory:  as needed  Psychotherapy: milieu, group therapy  Medications: we discussed antidepressant medication options- based on persistent depression and history of  cardiovascula disease-CAD/HTN, would discontinue Wellbutrin and try another antidepressant trial. He reports he was on Cymbalta for close to two years in the past, without side effects, although does not remember " how much it worked". Cymbalta may also help address pain . Side effects reviewed. Start Cymbalta 30 mgrs QDAY  Continue Neurontin 200 mgrs BID  Consultations: as needed    Discharge Concerns:  Homelessness, readmission to [facility] within one week  Estimated LOS: 4-5 days  Other:     Physician Treatment  Plan for Primary Diagnosis: MDD Long Term Goal(s): Improvement in symptoms so as ready for discharge  Short Term Goals: Ability to identify changes in lifestyle to reduce recurrence of condition will improve, Ability to verbalize feelings will improve, Ability to disclose and discuss suicidal ideas, Ability to demonstrate self-control will improve, Ability to identify and develop effective coping behaviors will improve and  Ability to maintain clinical measurements within normal limits will improve  Physician Treatment Plan for Secondary Diagnosis: Active Problems:   MDD (major depressive disorder), recurrent episode, severe (HCC)  Long Term Goal(s): Improvement in symptoms so as ready for discharge  Short Term Goals: Ability to identify changes in lifestyle to reduce recurrence of condition will improve, Ability to verbalize feelings will improve, Ability to disclose and discuss suicidal ideas, Ability to demonstrate self-control will improve, Ability to identify and develop effective coping behaviors will improve and Ability to maintain clinical measurements within normal limits will improve  I certify that inpatient services furnished can reasonably be expected to improve the patient's condition.    Craige CottaFernando A Aharon Carriere, MD 7/26/20208:36 AM

## 2019-06-25 NOTE — Progress Notes (Signed)
[place] NOVEL CORONAVIRUS (COVID-19) DAILY CHECK-OFF SYMPTOMS - answer yes or no to each - every day NO YES  Have you had a fever in the past 24 hours?  . Fever (Temp > 37.80C / 100F) X   Have you had any of these symptoms in the past 24 hours? . New Cough .  Sore Throat  .  Shortness of Breath .  Difficulty Breathing .  Unexplained Body Aches   X   Have you had any one of these symptoms in the past 24 hours not related to allergies?   . Runny Nose .  Nasal Congestion .  Sneezing   X   If you have had runny nose, nasal congestion, sneezing in the past 24 hours, has it worsened?  X   EXPOSURES - check yes or no X   Have you traveled outside the state in the past 14 days?  X   Have you been in contact with someone with a confirmed diagnosis of COVID-19 or PUI in the past 14 days without wearing appropriate PPE?  X   Have you been living in the same home as a person with confirmed diagnosis of COVID-19 or a PUI (household contact)?    X   Have you been diagnosed with COVID-19?    X              What to do next: Answered NO to all: Answered YES to anything:   Proceed with unit schedule Follow the [facility] Inpatient Flowsheet.   

## 2019-06-25 NOTE — [facility] Group Notes (Signed)
[place] LCSW Group Therapy Note  Date/Time:  06/25/2019 9:00-10:00 or 10:00-11:00AM  Type of Therapy and Topic:  Group Therapy:  Healthy and Unhealthy Supports  Participation Level:  Active   Description of Group:  Patients in this group were introduced to the idea of adding a variety of healthy supports to address the various needs in their lives.Patients discussed what additional healthy supports could be helpful in their recovery and wellness after discharge in order to prevent future hospitalizations.   An emphasis was placed on using counselor, doctor, therapy groups, 12-step groups, and problem-specific support groups to expand supports.  They also worked as a group on developing a specific plan for several patients to deal with unhealthy supports through [place], psychoeducation with loved ones, and even termination of relationships.   Therapeutic Goals:   1)  discuss importance of adding supports to stay well once out of the hospital  2)  compare healthy versus unhealthy supports and identify some examples of each  3)  generate ideas and descriptions of healthy supports that can be added  4)  offer mutual support about how to address unhealthy supports  5)  encourage active participation in and adherence to discharge plan    Summary of Patient Progress:  The patient stated that current healthy supports in his life are kids while current unhealthy supports include some family members.  The patient expressed a willingness to add therapy as support to help in his recovery journey.   Therapeutic Modalities:   Motivational Interviewing Brief Solution-Focused Therapy  Rolanda Jay

## 2019-06-25 NOTE — Progress Notes (Signed)
Adult Psychoeducational Group Note  Date:  06/25/2019 Time:  12:08 AM  Group Topic/Focus:  Wrap-Up Group:   The focus of this group is to help patients review their daily goal of treatment and discuss progress on daily workbooks.  Participation Level:  Active  Participation Quality:  Appropriate  Affect:  Appropriate  Cognitive:  Appropriate  Insight: Appropriate  Engagement in Group:  Engaged  Modes of Intervention:  Discussion  Additional Comments:  Pt was just admitted this afternoon.  Pt does not have any goals.  Pt stated he feels like his meds need to be adjusted.  Pt rated the day at a 4/10.  [name] [name] 06/25/2019, 12:08 AM

## 2019-06-25 NOTE — Progress Notes (Signed)
Admission Note:  63 yr male who presents VC in no acute distress for the treatment of SI and Depression. Pt appears flat and depressed. Pt was calm and cooperative with admission process. Pt presents with passive SI and contracts for safety upon admission. Pt denies AVH . Pt was D/C from [facility] 7/22 12:20 pm and was back at [facility] @ 6pm 7/22. Pt OD on metoprolol, methocarbamol, and injected insulin. Pt CBG at ED was 33.   A: Skin was assessed by previous shift. PT searched and no contraband found by previous shift .   R:Pt had no additional questions or concerns.

## 2019-06-25 NOTE — [facility] Suicide Risk Assessment (Signed)
[facility] Admission Suicide Risk Assessment   Nursing information obtained from:  Patient Demographic factors:  Male, Living alone Current Mental Status:  Suicidal ideation indicated by patient, Suicide plan Loss Factors:  Decline in physical health Historical Factors:  NA Risk Reduction Factors:  Positive social support  Total Time spent with patient: 45 minutes Principal Problem: MDD Diagnosis:  Active Problems:   MDD (major depressive disorder), recurrent episode, severe (HCC)  Subjective Data:   Continued Clinical Symptoms:  Alcohol Use Disorder Identification Test Final Score (AUDIT): 0 The "Alcohol Use Disorders Identification Test", Guidelines for Use in Primary Care, Second Edition.  World Pharmacologist [facility]). Score between 0-7:  no or low risk or alcohol related problems. Score between 8-15:  moderate risk of alcohol related problems. Score between 16-19:  high risk of alcohol related problems. Score 20 or above:  warrants further diagnostic evaluation for alcohol dependence and treatment.   CLINICAL FACTORS:  63 year old male.  Presented to the emergency room for depression, overdose on meloxicam, metoprolol, insulin on 7/22 following being discharged from [facility] (from 7/17 to 7/22) earlier that day.  He was initially admitted to medical unit for stabilization.  Patient has a history of depression, and has been facing significant stressors including separation/wife and adult child moving out of state to [place], homelessness.    Musculoskeletal:   Psychiatric Specialty Exam: Physical Exam  ROS  Blood pressure 134/90, pulse 81, temperature 98.1 F (36.7 C), resp. rate 20, height 5\' 9"  (1.753 m), weight 104.8 kg, SpO2 98 %.Body mass index is 34.11 kg/m.  See admit note MSE   COGNITIVE FEATURES THAT CONTRIBUTE TO RISK:  Closed-mindedness and Loss of executive function    SUICIDE RISK:   Moderate:  Frequent suicidal ideation with limited intensity, and duration, some  specificity in terms of plans, no associated intent, good self-control, limited dysphoria/symptomatology, some risk factors present, and identifiable protective factors, including available and accessible social support.  PLAN OF CARE: Patient will be admitted to inpatient psychiatric unit for stabilization and safety. Will provide and encourage milieu participation. Provide medication management and maked adjustments as needed.  Will follow daily.    I certify that inpatient services furnished can reasonably be expected to improve the patient's condition.   Jenne Campus, MD 06/25/2019, 10:45 AM

## 2019-06-25 NOTE — Tx Team (Signed)
Initial Treatment Plan 06/25/2019 3:55 AM Nigel Berthold GDJ:[id number]    PATIENT STRESSORS: Health problems Marital or family conflict Substance abuse   PATIENT STRENGTHS: General fund of knowledge Motivation for treatment/growth   PATIENT IDENTIFIED PROBLEMS:  risk for suicide  depression  "game plan trying to get some sort of norm"                 DISCHARGE CRITERIA:  Improved stabilization in mood, thinking, and/or behavior Safe-care adequate arrangements made  PRELIMINARY DISCHARGE PLAN: Attend aftercare/continuing care group Attend 12-step recovery group Outpatient therapy  PATIENT/FAMILY INVOLVEMENT: This treatment plan has been presented to and reviewed with the patient, SEIBERT KEETER.  The patient and family have been given the opportunity to ask questions and make suggestions.  Providence Crosby, RN 06/25/2019, 3:55 AM

## 2019-06-25 NOTE — Progress Notes (Signed)
D. Pt presents with a flat affect/depressed mood- calm and cooperative behavior- friendly during interactions- pt has been visible in the milieu interacting appropriately with peers and staff. Per pt's self inventory, pt rated his depression, hopelessness and anxiety a 4/5/0, respectively.  Pt currently denies SI/HI and AVH  Pt writes that his goal today is " to adjust meds" . A. Labs and vitals monitored. Pt compliant with  medications. Pt supported emotionally and encouraged to express concerns and ask questions.   R. Pt remains safe with 15 minute checks. Will continue POC.

## 2019-06-26 LAB — GLUCOSE, CAPILLARY
Glucose-Capillary: 148 mg/dL — ABNORMAL HIGH (ref 70–99)
Glucose-Capillary: 321 mg/dL — ABNORMAL HIGH (ref 70–99)
Glucose-Capillary: 308 mg/dL — ABNORMAL HIGH (ref 70–99)
Glucose-Capillary: 322 mg/dL — ABNORMAL HIGH (ref 70–99)

## 2019-06-26 MED ORDER — GABAPENTIN 300 MG PO CAPS
300.0000 mg | ORAL_CAPSULE | Freq: Two times a day (BID) | ORAL | Status: DC
  Administered 2019-06-26 – 2019-06-30 (×8): 300 mg via ORAL
  Filled 2019-06-26 (×12): qty 1

## 2019-06-26 MED ORDER — DULOXETINE HCL 20 MG PO CPEP
40.0000 mg | ORAL_CAPSULE | Freq: Every day | ORAL | Status: DC
  Administered 2019-06-27 – 2019-06-28 (×2): 40 mg via ORAL
  Filled 2019-06-26 (×4): qty 2

## 2019-06-26 MED ORDER — INSULIN DETEMIR 100 UNIT/ML ~~LOC~~ SOLN
30.0000 [IU] | Freq: Two times a day (BID) | SUBCUTANEOUS | Status: DC
  Administered 2019-06-26 – 2019-06-30 (×8): 30 [IU] via SUBCUTANEOUS
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
## 2019-06-26 NOTE — Progress Notes (Signed)
D: Pt denies SI/HI/AVH. Pt is pleasant and cooperative. Pt stated he was doing better, pt said he feels safe in here. Pt visibly on the unit some this evening with peers in the dayroom A: Pt was offered support and encouragement. Pt was given scheduled medications. Pt was encourage to attend groups. Q 15 minute checks were done for safety.  R:Pt attends groups and interacts well with peers and staff. Pt is taking medication. Pt has no complaints.Pt receptive to treatment and safety maintained on unit.

## 2019-06-26 NOTE — Progress Notes (Addendum)
[facility] MD Progress Note  06/26/2019 3:15 PM [name] [name]  MRN:  [id number] Subjective:  Patient reports some improvement compared to admission. Denies medication side effects. Denies suicidal ideations. Objective: I have discussed case with treatment team and have met with patient. 63 year old male.  Presented to the emergency room for depression, overdose on meloxicam, metoprolol, insulin on 7/22 following being discharged from [facility] (from 7/17 to 7/22) earlier that day.  He was initially admitted to medical unit for stabilization.  Patient has a history of depression, and has been facing significant stressors including separation/wife and adult child moving out of state to [place], homelessness.   Patient reports partially improved mood. Affect remains vaguely depressed, constricted. Denies suicidal ideations. Behavior on unit calm and in good control, visible in day room and going to some groups. Denies medication side effects. Labs- HgbA1c 9.7, CBG s 373/374 today .   Principal Problem:  Depression Diagnosis: Active Problems:   MDD (major depressive disorder), recurrent episode, severe ([place])  Total Time spent with patient: 20 minutes  Past Psychiatric History:  Past Medical History:  Past Medical History:  Diagnosis Date  . Barrett's esophagus   . CAD (coronary artery disease)    a. NSTEMI 05/2014 - occluded RCA dominant proximal s/p asp-thrombectomy/DES to RCA, minimal LAD/LCx, EF 50% by cath, 65-70% by echo.  . Depression   . Diabetes type 2, uncontrolled ([place])   . Former tobacco use   . GERD (gastroesophageal reflux disease)   . Hemorrhoids    hx of  . Hypercholesterolemia   . Hypertension   . MI (myocardial infarction) ([place])   . Peripheral neuropathy     Past Surgical History:  Procedure Laterality Date  . LEFT HEART CATHETERIZATION WITH CORONARY ANGIOGRAM N/A 06/14/2014   Procedure: LEFT HEART CATHETERIZATION WITH CORONARY ANGIOGRAM;  Surgeon: Jettie Booze, MD;   Location: [facility] CATH LAB;  Service: Cardiovascular;  Laterality: N/A;  . TONSILLECTOMY     Family History:  Family History  Problem Relation Age of Onset  . Hypertension Mother   . Alzheimer's disease Mother   . Alcohol abuse Father   . Cancer Father 17       "Throat"  . Diabetes type II Brother   . Cancer Brother 68       throat cancer  . Heart disease Neg Hx    Family Psychiatric  History:  Social History:  Social History   Substance and Sexual Activity  Alcohol Use No  . Alcohol/week: 1.0 standard drinks  . Types: 1 Standard drinks or equivalent per week     Social History   Substance and Sexual Activity  Drug Use No    Social History   Socioeconomic History  . Marital status: Single    Spouse name: Not on file  . Number of children: Not on file  . Years of education: Not on file  . Highest education level: Not on file  Occupational History  . Occupation: unemployed    Comment: applying for disability  Social Needs  . Financial resource strain: Not on file  . Food insecurity    Worry: Not on file    Inability: Not on file  . Transportation needs    Medical: Not on file    Non-medical: Not on file  Tobacco Use  . Smoking status: Former Smoker    Packs/day: 0.25    Years: 3.00    Pack years: 0.75    Quit date: 11/30/1998    Years since  quitting: 20.5  . Smokeless tobacco: Never Used  Substance and Sexual Activity  . Alcohol use: No    Alcohol/week: 1.0 standard drinks    Types: 1 Standard drinks or equivalent per week  . Drug use: No  . Sexual activity: Yes    Birth control/protection: None  Lifestyle  . Physical activity    Days per week: Not on file    Minutes per session: Not on file  . Stress: Not on file  Relationships  . Social Herbalist on phone: Not on file    Gets together: Not on file    Attends religious service: Not on file    Active member of club or organization: Not on file    Attends meetings of clubs or organizations:  Not on file    Relationship status: Not on file  Other Topics Concern  . Not on file  Social History Narrative  . Not on file   Additional Social History:   Sleep: Good  Appetite:  Fair  Current Medications: Current Facility-Administered Medications  Medication Dose Route Frequency Provider Last Rate Last Dose  . acetaminophen (TYLENOL) tablet 650 mg  650 mg Oral Q6H PRN Suella Broad, FNP   650 mg at 06/26/19 1214  . alum & mag hydroxide-simeth (MAALOX/MYLANTA) 200-200-20 MG/5ML suspension 30 mL  30 mL Oral Q4H PRN Burt Ek, Gayland Curry, FNP      . aspirin EC tablet 81 mg  81 mg Oral Daily Suella Broad, FNP   81 mg at 06/26/19 0806  . [START ON 06/27/2019] DULoxetine (CYMBALTA) DR capsule 40 mg  40 mg Oral Daily ,  A, MD      . gabapentin (NEURONTIN) capsule 300 mg  300 mg Oral BID , Myer Peer, MD      . HYDROcodone-acetaminophen (NORCO/VICODIN) 5-325 MG per tablet 1 tablet  1 tablet Oral Q6H PRN , Myer Peer, MD   1 tablet at 06/26/19 1411  . hydrocortisone cream 0.5 %   Topical BID PRN Laverle Hobby, PA-C   1 application at 94/49/67 0831  . hydrOXYzine (ATARAX/VISTARIL) tablet 25 mg  25 mg Oral TID PRN Suella Broad, FNP      . insulin aspart (novoLOG) injection 0-5 Units  0-5 Units Subcutaneous QHS Derrill Center, NP   5 Units at 06/25/19 2117  . insulin aspart (novoLOG) injection 0-9 Units  0-9 Units Subcutaneous TID WC Connye Burkitt, NP   7 Units at 06/26/19 1214  . insulin detemir (LEVEMIR) injection 20 Units  20 Units Subcutaneous BID Suella Broad, FNP   20 Units at 06/26/19 0805  . lisinopril (ZESTRIL) tablet 10 mg  10 mg Oral Daily Suella Broad, FNP   10 mg at 06/26/19 0806  . magnesium hydroxide (MILK OF MAGNESIA) suspension 30 mL  30 mL Oral Daily PRN Suella Broad, FNP      . metoprolol tartrate (LOPRESSOR) tablet 25 mg  25 mg Oral BID Suella Broad, FNP   25 mg at 06/26/19 0806   . pantoprazole (PROTONIX) EC tablet 40 mg  40 mg Oral Daily Suella Broad, FNP   40 mg at 06/26/19 0806  . rosuvastatin (CRESTOR) tablet 40 mg  40 mg Oral Daily Suella Broad, FNP   40 mg at 06/26/19 5916    Lab Results:  Results for orders placed or performed during the hospital encounter of 06/24/19 (from the past 48 hour(s))  Glucose, capillary  Status: Abnormal   Collection Time: 06/24/19  8:45 PM  Result Value Ref Range   Glucose-Capillary 233 (H) 70 - 99 mg/dL  Glucose, capillary     Status: None   Collection Time: 06/25/19  6:01 AM  Result Value Ref Range   Glucose-Capillary 79 70 - 99 mg/dL  Glucose, capillary     Status: Abnormal   Collection Time: 06/25/19 12:02 PM  Result Value Ref Range   Glucose-Capillary 343 (H) 70 - 99 mg/dL   Comment 1 Notify RN    Comment 2 Document in Chart   Glucose, capillary     Status: Abnormal   Collection Time: 06/25/19  4:54 PM  Result Value Ref Range   Glucose-Capillary 373 (H) 70 - 99 mg/dL  Hemoglobin A1c     Status: Abnormal   Collection Time: 06/25/19  6:02 PM  Result Value Ref Range   Hgb A1c MFr Bld 9.7 (H) 4.8 - 5.6 %    Comment: (NOTE) Pre diabetes:          5.7%-6.4% Diabetes:              >6.4% Glycemic control for   <7.0% adults with diabetes    Mean Plasma Glucose 231.69 mg/dL    Comment: Performed at [facility], [place] [address], [place], [place] [address]  Glucose, capillary     Status: Abnormal   Collection Time: 06/25/19  8:17 PM  Result Value Ref Range   Glucose-Capillary 374 (H) 70 - 99 mg/dL   Comment 1 Notify RN   Glucose, capillary     Status: Abnormal   Collection Time: 06/26/19  5:55 AM  Result Value Ref Range   Glucose-Capillary 148 (H) 70 - 99 mg/dL   Comment 1 Notify RN   Glucose, capillary     Status: Abnormal   Collection Time: 06/26/19 12:13 PM  Result Value Ref Range   Glucose-Capillary 321 (H) 70 - 99 mg/dL    Blood Alcohol level:  Lab Results  Component Value  Date   ETH <10 06/21/2019   ETH <10 50/01/7047    Metabolic Disorder Labs: Lab Results  Component Value Date   HGBA1C 9.7 (H) 06/25/2019   MPG 231.69 06/25/2019   MPG 228.82 06/16/2019   No results found for: PROLACTIN Lab Results  Component Value Date   CHOL 263 (H) 06/16/2019   TRIG 376 (H) 06/16/2019   HDL 37 (L) 06/16/2019   CHOLHDL 7.1 06/16/2019   VLDL 75 (H) 06/16/2019   LDLCALC 151 (H) 06/16/2019   LDLCALC 68 11/20/2018    Physical Findings: AIMS:  , ,  ,  ,    CIWA:    COWS:     Musculoskeletal: Strength & Muscle Tone: within normal limits Gait & Station: normal Patient leans: N/A  Psychiatric Specialty Exam: Physical Exam  ROS denies headache, no chest pain, no shortness of breath, no cough, no vomiting , no fever or chills   Blood pressure 127/87, pulse 83, temperature 98 F (36.7 C), temperature source Oral, resp. rate 20, height _0  (1.753 m), weight 104.8 kg, SpO2 98 %.Body mass index is 34.11 kg/m.  General Appearance: improivng grooming   Eye Contact:  Good  Speech:  Normal Rate  Volume:  Normal  Mood:  reports some improvement, still depressed  Affect:  vaguely constricted, does smile briefly at times  Thought Process:  Linear and Descriptions of Associations: Intact  Orientation:  Full (Time, Place, and Person)  Thought Content:  no hallucinations, no delusions, not internally preoccupied   Suicidal Thoughts:  No denies suicidal or self injurious ideations, denies homicidal or violent ideations at this time and contracts for safety on unit   Homicidal Thoughts:  No  Memory:  recent and remote grossly intact   Judgement:  Fair/ improving  Insight:  fair/improving  Psychomotor Activity:  Normal  Concentration:  Concentration: Good and Attention Span: Good  Recall:  Good  Fund of Knowledge:  Good  Language:  Good  Akathisia:  Negative  Handed:  Right  AIMS (if indicated):     Assets:  Desire for Improvement Resilience  ADL's:  Intact   Cognition:  WNL  Sleep:  Number of Hours: 6.25   Assessment -  63 year old male.  Presented to the emergency room for depression, overdose on meloxicam, metoprolol, insulin on 7/22 following being discharged from [facility] (from 7/17 to 7/22) earlier that day.  He was initially admitted to medical unit for stabilization.  Patient has a history of depression, and has been facing significant stressors including separation/wife and adult child moving out of state to [place], homelessness.  Patient reports some improvement compared to admission, but remains depressed , with a vaguely constricted affect. Denies suicidal ideations at this time.  Tolerating Cymbalta trial  well thus far. DM is poorly controlled - CBG  321, HgbA1C 9.7- I spoke with hospitalist consultant , reviewed case with him for help with diabetic management. Recommendation is to increase Levemir Insulin to 30 units BID, which is what patient reports he was taking prior to admission.   Treatment Plan Summary: Daily contact with patient to assess and evaluate symptoms and progress in treatment, Medication management, Plan inpatient treatment and medications as below Encourage patient to participate in groups and milieu Treatment team working on disposition planning options Increase Cymbalta to 40 mgrs QDAY for depression, anxiety, pain Increase  Neurontin to 300 mgrs BID to address pain Continue Hydrocodone- Acetaminophen 5/325 Q 6 hours PRN for pain Continue diabetic management- increase Levemir to 30 units BID, Novolog meal sliding scale Continue Lisinopril , Lopressor for HTN Continue Rosuvastatin for hyperlipidemia Continue Protonix 40 mgrs QDAY for gastric protection/GERD   Jenne Campus, MD 06/26/2019, 3:15 PM

## 2019-06-26 NOTE — Progress Notes (Signed)
Patient slef inventory- patient slept well last night, sleep medication was not requested, appetite is good, energy level low, concentration poor. Depression, hopelessness, and anxiety rated 4, 5, 1 out of 10. Denies SI HI AVH. Endorses pain 6/10 in hips and lower back. Patient's goal is "adjust meds and speak with doctor."  Patient is compliant with medications. Denies any side effects. Safety is maintained with 15 minute checks as well as environmental checks. Will continue to monitor and assess.

## 2019-06-26 NOTE — Progress Notes (Signed)
Adult Psychoeducational Group Note  Date:  06/26/2019 Time:  11:33 PM  Group Topic/Focus:  Wrap-Up Group:   The focus of this group is to help patients review their daily goal of treatment and discuss progress on daily workbooks.  Participation Level:  Active  Participation Quality:  Appropriate  Affect:  Appropriate  Cognitive:  Appropriate  Insight: Appropriate  Engagement in Group:  Engaged  Modes of Intervention:  Discussion  Additional Comments:  Patient attended group and participated.  [name] [name] 0/93/2355, 11:33 PM

## 2019-06-26 NOTE — Tx Team (Signed)
Interdisciplinary Treatment and Diagnostic Plan Update  06/26/2019 Time of Session:  [name] [name] MRN: [id number]  Principal Diagnosis: <principal problem not specified>  Secondary Diagnoses: Active Problems:   MDD (major depressive disorder), recurrent episode, severe ([facility])   Current Medications:  Current Facility-Administered Medications  Medication Dose Route Frequency Provider Last Rate Last Dose  . acetaminophen (TYLENOL) tablet 650 mg  650 mg Oral Q6H PRN Suella Broad, FNP      . alum & mag hydroxide-simeth (MAALOX/MYLANTA) 200-200-20 MG/5ML suspension 30 mL  30 mL Oral Q4H PRN Burt Ek, Gayland Curry, FNP      . aspirin EC tablet 81 mg  81 mg Oral Daily Suella Broad, FNP   81 mg at 06/26/19 0806  . DULoxetine (CYMBALTA) DR capsule 30 mg  30 mg Oral Daily Cobos, Myer Peer, MD   30 mg at 06/26/19 0806  . gabapentin (NEURONTIN) capsule 200 mg  200 mg Oral BID Suella Broad, FNP   200 mg at 06/26/19 0806  . HYDROcodone-acetaminophen (NORCO/VICODIN) 5-325 MG per tablet 1 tablet  1 tablet Oral Q6H PRN Cobos, Myer Peer, MD   1 tablet at 06/26/19 0806  . hydrocortisone cream 0.5 %   Topical BID PRN Laverle Hobby, PA-C   1 application at 85/02/77 0831  . hydrOXYzine (ATARAX/VISTARIL) tablet 25 mg  25 mg Oral TID PRN Suella Broad, FNP      . insulin aspart (novoLOG) injection 0-5 Units  0-5 Units Subcutaneous QHS Derrill Center, NP   5 Units at 06/25/19 2117  . insulin aspart (novoLOG) injection 0-9 Units  0-9 Units Subcutaneous TID WC Connye Burkitt, NP   1 Units at 06/26/19 [phone]  . insulin detemir (LEVEMIR) injection 20 Units  20 Units Subcutaneous BID Suella Broad, FNP   20 Units at 06/26/19 0805  . lisinopril (ZESTRIL) tablet 10 mg  10 mg Oral Daily Suella Broad, FNP   10 mg at 06/26/19 0806  . magnesium hydroxide (MILK OF MAGNESIA) suspension 30 mL  30 mL Oral Daily PRN Suella Broad, FNP      . metoprolol tartrate  (LOPRESSOR) tablet 25 mg  25 mg Oral BID Suella Broad, FNP   25 mg at 06/26/19 0806  . pantoprazole (PROTONIX) EC tablet 40 mg  40 mg Oral Daily Suella Broad, FNP   40 mg at 06/26/19 0806  . rosuvastatin (CRESTOR) tablet 40 mg  40 mg Oral Daily Suella Broad, FNP   40 mg at 06/26/19 7867   PTA Medications: Medications Prior to Admission  Medication Sig Dispense Refill Last Dose  . buPROPion (WELLBUTRIN XL) 150 MG 24 hr tablet Take 1 tablet (150 mg total) by mouth daily. 30 tablet 0   . calcium carbonate (TUMS EX) 750 MG chewable tablet Chew 2 tablets by mouth daily as needed for heartburn.      . EQ ASPIRIN ADULT LOW DOSE 81 MG EC tablet TAKE ONE TABLET BY MOUTH ONCE DAILY (Patient taking differently: Take 81 mg by mouth daily. ) 90 tablet 0   . gabapentin (NEURONTIN) 100 MG capsule Take 2 capsules (200 mg total) by mouth 2 (two) times daily. 120 capsule 0   . HYDROcodone-acetaminophen (NORCO) 7.5-325 MG tablet Take 1 tablet by mouth every 6 (six) hours as needed for moderate pain. 120 tablet 0   . hydrocortisone cream 0.5 % Apply topically 2 (two) times daily. 30 g 0   . insulin aspart (NOVOLOG) 100  UNIT/ML injection Inject 0-15 Units into the skin 3 (three) times daily before meals. 121-150=2 units, 151-200=4 units, 201-250=7 units, 251-300=9 units, 301-350=12 units, >351=15 units     . insulin detemir (LEVEMIR) 100 UNIT/ML injection Inject 0.2 mLs (20 Units total) into the skin 2 (two) times daily. 12 mL 0   . lisinopril (ZESTRIL) 10 MG tablet Take 1 tablet (10 mg total) by mouth daily. 30 tablet 0   . metoprolol tartrate (LOPRESSOR) 25 MG tablet Take 1 tablet (25 mg total) by mouth 2 (two) times daily. 60 tablet 0   . nitroGLYCERIN (NITROSTAT) 0.4 MG SL tablet Place 1 tablet (0.4 mg total) under the tongue every 5 (five) minutes as needed for chest pain (up to 3 doses). 25 tablet 0   . pantoprazole (PROTONIX) 40 MG tablet Take 1 tablet (40 mg total) by mouth daily.  90 tablet 0   . rosuvastatin (CRESTOR) 40 MG tablet TAKE 1 TABLET(40 MG) BY MOUTH DAILY (Patient taking differently: Take 40 mg by mouth daily. ) 90 tablet 0     Patient Stressors: Health problems Marital or family conflict Substance abuse  Patient Strengths: General fund of knowledge Motivation for treatment/growth  Treatment Modalities: Medication Management, Group therapy, Case management,  1 to 1 session with clinician, Psychoeducation, Recreational therapy.   Physician Treatment Plan for Primary Diagnosis: <principal problem not specified> Long Term Goal(s): Improvement in symptoms so as ready for discharge Improvement in symptoms so as ready for discharge   Short Term Goals: Ability to identify changes in lifestyle to reduce recurrence of condition will improve Ability to verbalize feelings will improve Ability to disclose and discuss suicidal ideas Ability to demonstrate self-control will improve Ability to identify and develop effective coping behaviors will improve Ability to maintain clinical measurements within normal limits will improve Ability to identify changes in lifestyle to reduce recurrence of condition will improve Ability to verbalize feelings will improve Ability to disclose and discuss suicidal ideas Ability to demonstrate self-control will improve Ability to identify and develop effective coping behaviors will improve Ability to maintain clinical measurements within normal limits will improve  Medication Management: Evaluate patient's response, side effects, and tolerance of medication regimen.  Therapeutic Interventions: 1 to 1 sessions, Unit Group sessions and Medication administration.  Evaluation of Outcomes: Not Met  Physician Treatment Plan for Secondary Diagnosis: Active Problems:   MDD (major depressive disorder), recurrent episode, severe ([place])  Long Term Goal(s): Improvement in symptoms so as ready for discharge Improvement in symptoms so as  ready for discharge   Short Term Goals: Ability to identify changes in lifestyle to reduce recurrence of condition will improve Ability to verbalize feelings will improve Ability to disclose and discuss suicidal ideas Ability to demonstrate self-control will improve Ability to identify and develop effective coping behaviors will improve Ability to maintain clinical measurements within normal limits will improve Ability to identify changes in lifestyle to reduce recurrence of condition will improve Ability to verbalize feelings will improve Ability to disclose and discuss suicidal ideas Ability to demonstrate self-control will improve Ability to identify and develop effective coping behaviors will improve Ability to maintain clinical measurements within normal limits will improve     Medication Management: Evaluate patient's response, side effects, and tolerance of medication regimen.  Therapeutic Interventions: 1 to 1 sessions, Unit Group sessions and Medication administration.  Evaluation of Outcomes: Not Met   RN Treatment Plan for Primary Diagnosis: <principal problem not specified> Long Term Goal(s): Knowledge of disease and therapeutic regimen  to maintain health will improve  Short Term Goals: Ability to participate in decision making will improve, Ability to verbalize feelings will improve, Ability to disclose and discuss suicidal ideas, Ability to identify and develop effective coping behaviors will improve and Compliance with prescribed medications will improve  Medication Management: RN will administer medications as ordered by provider, will assess and evaluate patient's response and provide education to patient for prescribed medication. RN will report any adverse and/or side effects to prescribing provider.  Therapeutic Interventions: 1 on 1 counseling sessions, Psychoeducation, Medication administration, Evaluate responses to treatment, Monitor vital signs and CBGs as  ordered, Perform/monitor CIWA, COWS, AIMS and Fall Risk screenings as ordered, Perform wound care treatments as ordered.  Evaluation of Outcomes: Not Met   LCSW Treatment Plan for Primary Diagnosis: <principal problem not specified> Long Term Goal(s): Safe transition to appropriate next level of care at discharge, Engage patient in therapeutic group addressing interpersonal concerns.  Short Term Goals: Engage patient in aftercare planning with referrals and resources  Therapeutic Interventions: Assess for all discharge needs, 1 to 1 time with Social worker, Explore available resources and support systems, Assess for adequacy in community support network, Educate family and significant other(s) on suicide prevention, Complete Psychosocial Assessment, Interpersonal group therapy.  Evaluation of Outcomes: Not Met   Progress in Treatment: Attending groups: No. New to unit Participating in groups: No. Taking medication as prescribed: Yes. Toleration medication: Yes. Family/Significant other contact made: No, will contact:  if patient consents to collateral contacts Patient understands diagnosis: Yes. Discussing patient identified problems/goals with staff: Yes. Medical problems stabilized or resolved: Yes. Denies suicidal/homicidal ideation: Yes. Issues/concerns per patient self-inventory: No. Other:   New problem(s) identified: None   New Short Term/Long Term Goal(s): Detox, medication stabilization, elimination of SI thoughts, development of comprehensive mental wellness plan.    Patient Goals:    Discharge Plan or Barriers: Patient recently admitted. CSW will continue to follow and assess for appropriate referrals and possible discharge planning.    Reason for Continuation of Hospitalization: Anxiety Depression Medication stabilization Suicidal ideation  Estimated Length of Stay: 3-5 days   Attendees: Patient: 06/26/2019 11:35 AM  Physician: Dr. Neita Garnet, MD  06/26/2019 11:35 AM  Nursing: Yetta Flock.Lenna Sciara RN 06/26/2019 11:35 AM  RN Care Manager: 06/26/2019 11:35 AM  Social Worker: Radonna Ricker, [place] 06/26/2019 11:35 AM  Recreational Therapist:  06/26/2019 11:35 AM  Other:  06/26/2019 11:35 AM  Other:  06/26/2019 11:35 AM  Other: 06/26/2019 11:35 AM    Scribe for Treatment Team: Marylee Floras, [place] 06/26/2019 11:35 AM

## 2019-06-26 NOTE — [facility] Suicide Risk Assessment (Signed)
[facility] INPATIENT:  Family/Significant Other Suicide Prevention Education  Suicide Prevention Education:  Patient Refusal for Family/Significant Other Suicide Prevention Education: The patient [name] [name] has refused to provide written consent for family/significant other to be provided Family/Significant Other Suicide Prevention Education during admission and/or prior to discharge.  Physician notified.  SPE completed with patient, as patient refused to consent to family contact. SPI pamphlet provided to pt and pt was encouraged to share information with support network, ask questions, and talk about any concerns relating to SPE. Patient denies access to guns/firearms and verbalized understanding of information provided. Mobile Crisis information also provided to patient.    Marylee Floras 06/26/2019, 3:47 PM

## 2019-06-26 NOTE — Plan of Care (Signed)
  Problem: Education: Goal: Verbalization of understanding the information provided will improve Outcome: Progressing   Problem: Coping: Goal: Ability to verbalize frustrations and anger appropriately will improve Outcome: Progressing Goal: Ability to demonstrate self-control will improve Outcome: Progressing   Problem: Health Behavior/Discharge Planning: Goal: Identification of resources available to assist in meeting health care needs will improve Outcome: Progressing Goal: Compliance with treatment plan for underlying cause of condition will improve Outcome: Progressing

## 2019-06-26 NOTE — Progress Notes (Signed)
Recreation Therapy Notes  Date:  7.27.20 Time: 0930 Location: 300 Hall Dayroom  Group Topic: Stress Management  Goal Area(s) Addresses:  Patient will identify positive stress management techniques. Patient will identify benefits of using stress management post d/c.  Intervention:  Stress Management  Activity :  Meditation.  LRT introduced the stress management technique of meditation.  LRT played a meditation that focused on making the most of your day.  Patients were to listen to follow along as listen meditation played.  Education:  Stress Management, Discharge Planning.   Education Outcome: Acknowledges Education  Clinical Observations/Feedback: Pt did not attend group.     Victorino Sparrow, LRT/CTRS         Ria Comment, Francie Keeling A 06/26/2019 10:58 AM

## 2019-06-27 DIAGNOSIS — F339 Major depressive disorder, recurrent, unspecified: Secondary | ICD-10-CM

## 2019-06-27 LAB — GLUCOSE, CAPILLARY
Glucose-Capillary: 172 mg/dL — ABNORMAL HIGH (ref 70–99)
Glucose-Capillary: 220 mg/dL — ABNORMAL HIGH (ref 70–99)
Glucose-Capillary: 251 mg/dL — ABNORMAL HIGH (ref 70–99)
Glucose-Capillary: 260 mg/dL — ABNORMAL HIGH (ref 70–99)

## 2019-06-27 MED ORDER — ONDANSETRON 4 MG PO TBDP
4.0000 mg | ORAL_TABLET | Freq: Three times a day (TID) | ORAL | Status: DC | PRN
  Administered 2019-06-27: 4 mg via ORAL
  Filled 2019-06-27: qty 1

## 2019-06-27 NOTE — [facility] Group Notes (Signed)
LCSW Group Therapy Note 06/27/2019 2:15 PM  Type of Therapy and Topic: Group Therapy: Overcoming Obstacles  Participation Level: Active  Description of Group:  In this group patients will be encouraged to explore what they see as obstacles to their own wellness and recovery. They will be guided to discuss their thoughts, feelings, and behaviors related to these obstacles. The group will process together ways to cope with barriers, with attention given to specific choices patients can make. Each patient will be challenged to identify changes they are motivated to make in order to overcome their obstacles. This group will be process-oriented, with patients participating in exploration of their own experiences as well as giving and receiving support and challenge from other group members.  Therapeutic Goals: 1. Patient will identify personal and current obstacles as they relate to admission. 2. Patient will identify barriers that currently interfere with their wellness or overcoming obstacles.  3. Patient will identify feelings, thought process and behaviors related to these barriers. 4. Patient will identify two changes they are willing to make to overcome these obstacles:   Summary of Patient Progress  [name] [name] was engaged and participated throughout the group session. [name] [name] reports that his obstacles are homelessness and food insecurities. [name] [name] reports his mental health issues are his main barriers.     Therapeutic Modalities:  Cognitive Behavioral Therapy Solution Focused Therapy Motivational Interviewing Relapse Prevention Therapy   Theresa Duty Clinical Social Worker

## 2019-06-27 NOTE — Progress Notes (Signed)
[facility] MD Progress Note  06/27/2019 9:47 AM [name] [name]  MRN:  [id number] Subjective: I feel a little better. I am trying to get adjusted to this medication. They started me on some new medications. I feel drowsy and dizzy. Im trying to work on a game plan for when I leave the hospital.    Objective: I have discussed case with treatment team and have met with patient. 63 year old male. Patient discharged from [facility] on 07/22, states all of his resources fell through due to beds being at full capacity.  He states he overdosed the same day of being discharged. He was transferred here once he was medically cleared. He identifies some of his stressors as separation from wife, homeless, and emptiness. During the evaluation today he states he is feeling better but his affect is not consistent with his mood. He continues to present as flat, despondent, and melancholic. He states his goal today is to reach out to his and develop a game plan for discharge. He states he is eating and sleeping well with no changes or disturbances at this time. He rates his depression 6/10 and anxiety 4/10 with 10 being the worse. At this time he continues to endorse intermittent fleeting thoughts of suicide. He is able to contract for safety at this time.  Behavior on unit calm and in good control, visible in day room and going to some groups. Denies medication side effects. Labs- HgbA1c 9.7, CBG s 373/374 today .   Principal Problem:  Depression Diagnosis: Active Problems:   MDD (major depressive disorder), recurrent episode, severe ([place])  Total Time spent with patient: 20 minutes  Past Psychiatric History: MDD severe Past Medical History:  Past Medical History:  Diagnosis Date  . Barrett's esophagus   . CAD (coronary artery disease)    a. NSTEMI 05/2014 - occluded RCA dominant proximal s/p asp-thrombectomy/DES to RCA, minimal LAD/LCx, EF 50% by cath, 65-70% by echo.  . Depression   . Diabetes type 2, uncontrolled ([place])    . Former tobacco use   . GERD (gastroesophageal reflux disease)   . Hemorrhoids    hx of  . Hypercholesterolemia   . Hypertension   . MI (myocardial infarction) ([place])   . Peripheral neuropathy     Past Surgical History:  Procedure Laterality Date  . LEFT HEART CATHETERIZATION WITH CORONARY ANGIOGRAM N/A 06/14/2014   Procedure: LEFT HEART CATHETERIZATION WITH CORONARY ANGIOGRAM;  Surgeon: Jettie Booze, MD;  Location: [facility] C[facility] LAB;  Service: Cardiovascular;  Laterality: N/A;  . TONSILLECTOMY     Family History:  Family History  Problem Relation Age of Onset  . Hypertension Mother   . Alzheimer's disease Mother   . Alcohol abuse Father   . Cancer Father 21       "Throat"  . Diabetes type II Brother   . Cancer Brother 68       throat cancer  . Heart disease Neg Hx    Family Psychiatric  History:  Social History:  Social History   Substance and Sexual Activity  Alcohol Use No  . Alcohol/week: 1.0 standard drinks  . Types: 1 Standard drinks or equivalent per week     Social History   Substance and Sexual Activity  Drug Use No    Social History   Socioeconomic History  . Marital status: Single    Spouse name: Not on file  . Number of children: Not on file  . Years of education: Not on file  .  Highest education level: Not on file  Occupational History  . Occupation: unemployed    Comment: applying for disability  Social Needs  . Financial resource strain: Not on file  . Food insecurity    Worry: Not on file    Inability: Not on file  . Transportation needs    Medical: Not on file    Non-medical: Not on file  Tobacco Use  . Smoking status: Former Smoker    Packs/day: 0.25    Years: 3.00    Pack years: 0.75    Quit date: 11/30/1998    Years since quitting: 20.5  . Smokeless tobacco: Never Used  Substance and Sexual Activity  . Alcohol use: No    Alcohol/week: 1.0 standard drinks    Types: 1 Standard drinks or equivalent per week  . Drug use: No  .  Sexual activity: Yes    Birth control/protection: None  Lifestyle  . Physical activity    Days per week: Not on file    Minutes per session: Not on file  . Stress: Not on file  Relationships  . Social Herbalist on phone: Not on file    Gets together: Not on file    Attends religious service: Not on file    Active member of club or organization: Not on file    Attends meetings of clubs or organizations: Not on file    Relationship status: Not on file  Other Topics Concern  . Not on file  Social History Narrative  . Not on file   Additional Social History:   Sleep: Good  Appetite:  Fair  Current Medications: Current Facility-Administered Medications  Medication Dose Route Frequency Provider Last Rate Last Dose  . acetaminophen (TYLENOL) tablet 650 mg  650 mg Oral Q6H PRN Suella Broad, FNP   650 mg at 06/26/19 1214  . alum & mag hydroxide-simeth (MAALOX/MYLANTA) 200-200-20 MG/5ML suspension 30 mL  30 mL Oral Q4H PRN Burt Ek, Gayland Curry, FNP      . aspirin EC tablet 81 mg  81 mg Oral Daily Suella Broad, FNP   81 mg at 06/27/19 0756  . DULoxetine (CYMBALTA) DR capsule 40 mg  40 mg Oral Daily Cobos, Myer Peer, MD   40 mg at 06/27/19 0801  . gabapentin (NEURONTIN) capsule 300 mg  300 mg Oral BID Cobos, Myer Peer, MD   300 mg at 06/27/19 0757  . HYDROcodone-acetaminophen (NORCO/VICODIN) 5-325 MG per tablet 1 tablet  1 tablet Oral Q6H PRN Cobos, Myer Peer, MD   1 tablet at 06/27/19 0756  . hydrocortisone cream 0.5 %   Topical BID PRN Laverle Hobby, PA-C   1 application at 49/17/91 0831  . hydrOXYzine (ATARAX/VISTARIL) tablet 25 mg  25 mg Oral TID PRN Suella Broad, FNP      . insulin aspart (novoLOG) injection 0-5 Units  0-5 Units Subcutaneous QHS Derrill Center, NP   4 Units at 06/26/19 2130  . insulin aspart (novoLOG) injection 0-9 Units  0-9 Units Subcutaneous TID WC Connye Burkitt, NP   2 Units at 06/27/19 [phone]  . insulin detemir  (LEVEMIR) injection 30 Units  30 Units Subcutaneous BID Cobos, Myer Peer, MD   30 Units at 06/27/19 0758  . lisinopril (ZESTRIL) tablet 10 mg  10 mg Oral Daily Suella Broad, FNP   10 mg at 06/27/19 0757  . magnesium hydroxide (MILK OF MAGNESIA) suspension 30 mL  30 mL Oral Daily PRN  Suella Broad, FNP      . metoprolol tartrate (LOPRESSOR) tablet 25 mg  25 mg Oral BID Suella Broad, FNP   25 mg at 06/27/19 0757  . pantoprazole (PROTONIX) EC tablet 40 mg  40 mg Oral Daily Suella Broad, FNP   40 mg at 06/27/19 0757  . rosuvastatin (CRESTOR) tablet 40 mg  40 mg Oral Daily Suella Broad, FNP   40 mg at 06/27/19 2094    Lab Results:  Results for orders placed or performed during the hospital encounter of 06/24/19 (from the past 48 hour(s))  Glucose, capillary     Status: Abnormal   Collection Time: 06/25/19 12:02 PM  Result Value Ref Range   Glucose-Capillary 343 (H) 70 - 99 mg/dL   Comment 1 Notify RN    Comment 2 Document in Chart   Glucose, capillary     Status: Abnormal   Collection Time: 06/25/19  4:54 PM  Result Value Ref Range   Glucose-Capillary 373 (H) 70 - 99 mg/dL  Hemoglobin A1c     Status: Abnormal   Collection Time: 06/25/19  6:02 PM  Result Value Ref Range   Hgb A1c MFr Bld 9.7 (H) 4.8 - 5.6 %    Comment: (NOTE) Pre diabetes:          5.7%-6.4% Diabetes:              >6.4% Glycemic control for   <7.0% adults with diabetes    Mean Plasma Glucose 231.69 mg/dL    Comment: Performed at [facility], [address] [place], [place] [address]  Glucose, capillary     Status: Abnormal   Collection Time: 06/25/19  8:17 PM  Result Value Ref Range   Glucose-Capillary 374 (H) 70 - 99 mg/dL   Comment 1 Notify RN   Glucose, capillary     Status: Abnormal   Collection Time: 06/26/19  5:55 AM  Result Value Ref Range   Glucose-Capillary 148 (H) 70 - 99 mg/dL   Comment 1 Notify RN   Glucose, capillary     Status: Abnormal    Collection Time: 06/26/19 12:13 PM  Result Value Ref Range   Glucose-Capillary 321 (H) 70 - 99 mg/dL  Glucose, capillary     Status: Abnormal   Collection Time: 06/26/19  4:50 PM  Result Value Ref Range   Glucose-Capillary 308 (H) 70 - 99 mg/dL  Glucose, capillary     Status: Abnormal   Collection Time: 06/26/19  7:56 PM  Result Value Ref Range   Glucose-Capillary 322 (H) 70 - 99 mg/dL   Comment 1 Notify RN   Glucose, capillary     Status: Abnormal   Collection Time: 06/27/19  5:46 AM  Result Value Ref Range   Glucose-Capillary 172 (H) 70 - 99 mg/dL   Comment 1 Notify RN     Blood Alcohol level:  Lab Results  Component Value Date   ETH <10 06/21/2019   ETH <10 83/66/2947    Metabolic Disorder Labs: Lab Results  Component Value Date   HGBA1C 9.7 (H) 06/25/2019   MPG 231.69 06/25/2019   MPG 228.82 06/16/2019   No results found for: PROLACTIN Lab Results  Component Value Date   CHOL 263 (H) 06/16/2019   TRIG 376 (H) 06/16/2019   HDL 37 (L) 06/16/2019   CHOLHDL 7.1 06/16/2019   VLDL 75 (H) 06/16/2019   LDLCALC 151 (H) 06/16/2019   LDLCALC 68 11/20/2018    Physical  Findings: AIMS:  , ,  ,  ,    CIWA:    COWS:     Musculoskeletal: Strength & Muscle Tone: within normal limits Gait & Station: normal Patient leans: N/A  Psychiatric Specialty Exam: Physical Exam   ROS  denies headache, no chest pain, no shortness of breath, no cough, no vomiting , no fever or chills   Blood pressure (!) 141/87, pulse 86, temperature 98.4 F (36.9 C), temperature source Oral, resp. rate 20, height 5' 9"  (1.753 m), weight 104.8 kg, SpO2 98 %.Body mass index is 34.11 kg/m.  General Appearance: improivng grooming   Eye Contact:  Good  Speech:  Normal Rate  Volume:  Normal  Mood:  Depressed and Dysphoric  Affect:  Constricted, Depressed and Flat  Thought Process:  Linear and Descriptions of Associations: Intact  Orientation:  Full (Time, Place, and Person)  Thought Content:  no  hallucinations, no delusions, not internally preoccupied   Suicidal Thoughts:  No denies suicidal or self injurious ideations, denies homicidal or violent ideations at this time and contracts for safety on unit   Homicidal Thoughts:  No  Memory:  recent and remote grossly intact   Judgement:  Fair/ improving  Insight:  fair/improving  Psychomotor Activity:  Normal  Concentration:  Concentration: Good and Attention Span: Good  Recall:  Good  Fund of Knowledge:  Good  Language:  Good  Akathisia:  Negative  Handed:  Right  AIMS (if indicated):     Assets:  Desire for Improvement Resilience  ADL's:  Intact  Cognition:  WNL  Sleep:  Number of Hours: 5.75   Assessment -  63 year old male.  Presented to the emergency room for depression, overdose on meloxicam, metoprolol, insulin on 7/22 following being discharged from [facility] (from 7/17 to 7/22) earlier that day.  He was initially admitted to medical unit for stabilization.  Patient has a history of depression, and has been facing significant stressors including separation/wife and adult child moving out of state to [place], homelessness.  Patient reports some improvement compared to admission, but remains depressed , with a vaguely constricted affect. Denies suicidal ideations at this time.  Tolerating Cymbalta trial  well thus far. DM is poorly controlled - CBG  321, HgbA1C 9.7- I spoke with hospitalist consultant , reviewed case with him for help with diabetic management. Recommendation is to increase Levemir Insulin to 30 units BID, which is what patient reports he was taking prior to admission.   Treatment Plan Summary: Daily contact with patient to assess and evaluate symptoms and progress in treatment, Medication management, Plan inpatient treatment and medications as below Encourage patient to participate in groups and milieu Treatment team working on disposition planning options Increase Cymbalta to 40 mgrs QDAY for depression,  anxiety, pain Increase  Neurontin to 300 mgrs BID to address pain Continue Hydrocodone- Acetaminophen 5/325 Q 6 hours PRN for pain Continue diabetic management- increase Levemir to 30 units BID, Novolog meal sliding scale Continue Lisinopril , Lopressor for HTN Continue Rosuvastatin for hyperlipidemia Continue Protonix 40 mgrs QDAY for gastric protection/GERD   Suella Broad, FNP 06/27/2019, 9:47 AM

## 2019-06-27 NOTE — [facility] Group Notes (Signed)
Adult Psychoeducational Group Note  Date:  06/27/2019 Time:  10:16 PM  Group Topic/Focus:  Wrap-Up Group:   The focus of this group is to help patients review their daily goal of treatment and discuss progress on daily workbooks.  Participation Level:  Active  Participation Quality:  Appropriate and Attentive  Affect:  Appropriate  Cognitive:  Alert and Appropriate  Insight: Appropriate and Good  Engagement in Group:  Engaged  Modes of Intervention:  Discussion and Education  Additional Comments:  Pt attended and participated in wrap up group this evening and rated their day a 6/10. Pt did not complete their goal, which was to "get off the street".   Cristi Loron 06/27/2019, 10:16 PM

## 2019-06-27 NOTE — Progress Notes (Signed)
Inpatient Diabetes Program Recommendations  AACE/ADA: New Consensus Statement on Inpatient Glycemic Control (2015)  Target Ranges:  Prepandial:   less than 140 mg/dL      Peak postprandial:   less than 180 mg/dL (1-2 hours)      Critically ill patients:  140 - 180 mg/dL   Lab Results  Component Value Date   GLUCAP 220 (H) 06/27/2019   HGBA1C 9.7 (H) 06/25/2019    Review of Glycemic Control  Diabetes history: DM2 Outpatient Diabetes medications: Levemir 20 units bid, Novolog 0-15 units tidwc Current orders for Inpatient glycemic control: Levemir 30 units bid, Novolog 0-9 units tidwc and hs  HgbA1C - 9.7%- Pt has been counseled multiple times regarding HgbA1C.May benefit from addition of meal coverage insulin.  Inpatient Diabetes Program Recommendations:     Add Novolog 4 units tidwc for meal coverage insulin if pt eats > 50% meal.  Will continue to follow.  Thank you. Lorenda Peck, RD, LDN, CDE Inpatient Diabetes Coordinator [phone]

## 2019-06-27 NOTE — Progress Notes (Signed)
Spiritual care group on grief and loss facilitated by chaplain Juline Sanderford  Group Goal:  Support / Education around grief and loss Members engage in facilitated group support and psycho-social education.  Group Description:  Following introductions and group rules, group members engaged in facilitated group dialog and support around topic of loss, with particular support around experiences of loss in their lives. Group Identified types of loss (relationships / self / things) and identified patterns, circumstances, and changes that precipitate losses. Reflected on thoughts / feelings around loss, normalized grief responses, and recognized variety in grief experience. Patient Progress:  

## 2019-06-28 LAB — GLUCOSE, CAPILLARY
Glucose-Capillary: 131 mg/dL — ABNORMAL HIGH (ref 70–99)
Glucose-Capillary: 211 mg/dL — ABNORMAL HIGH (ref 70–99)
Glucose-Capillary: 337 mg/dL — ABNORMAL HIGH (ref 70–99)
Glucose-Capillary: 399 mg/dL — ABNORMAL HIGH (ref 70–99)

## 2019-06-28 MED ORDER — VITAMIN D 25 MCG (1000 UNIT) PO TABS
1000.0000 [IU] | ORAL_TABLET | Freq: Every day | ORAL | Status: DC
  Administered 2019-06-28 – 2019-06-30 (×3): 1000 [IU] via ORAL
  Filled 2019-06-28 (×5): qty 1

## 2019-06-28 MED ORDER — DULOXETINE HCL 60 MG PO CPEP
60.0000 mg | ORAL_CAPSULE | Freq: Every day | ORAL | Status: DC
  Administered 2019-06-29 – 2019-06-30 (×2): 60 mg via ORAL
  Filled 2019-06-28 (×4): qty 1

## 2019-06-28 MED ORDER — LISINOPRIL 10 MG PO TABS
10.0000 mg | ORAL_TABLET | Freq: Once | ORAL | Status: AC
  Administered 2019-06-28: 10 mg via ORAL
  Filled 2019-06-28: qty 1

## 2019-06-28 MED ORDER — LISINOPRIL 20 MG PO TABS
20.0000 mg | ORAL_TABLET | Freq: Every day | ORAL | Status: DC
  Administered 2019-06-29 – 2019-06-30 (×2): 20 mg via ORAL
  Filled 2019-06-28 (×4): qty 1

## 2019-06-28 MED ORDER — INSULIN ASPART 100 UNIT/ML ~~LOC~~ SOLN
4.0000 [IU] | Freq: Three times a day (TID) | SUBCUTANEOUS | Status: DC
  Administered 2019-06-28 – 2019-06-30 (×5): 4 [IU] via SUBCUTANEOUS

## 2019-06-28 NOTE — Progress Notes (Signed)
D: Pt denies SI/HI/AVH. Pt is pleasant and cooperative. Pt visible on the unit this evening. Pt stated he was doing ok A: Pt was offered support and encouragement. Pt was given scheduled medications. Pt was encourage to attend groups. Q 15 minute checks were done for safety.  R:Pt attends groups and interacts well with peers and staff. Pt is taking medication. Pt has no complaints.Pt receptive to treatment and safety maintained on unit.

## 2019-06-28 NOTE — Progress Notes (Signed)
Pt attended wrap-up group. He denies SI/HI/AVH at this time. Pt appears blunted/depressed in affect and mood. He c/o of pain and anxiety. Pt states he needs to find a place to go to upon d/c. CBG at HS was 399. Support offered and safety maintained.

## 2019-06-28 NOTE — Progress Notes (Signed)
[facility] Group Notes:  (Nursing/MHT/Case Management/Adjunct)  Date:  06/28/2019  Time: 2030   Type of Therapy:  wrap up group  Participation Level:  Active  Participation Quality:  Appropriate, Attentive, Sharing and Supportive  Affect:  Blunted  Cognitive:  Appropriate  Insight:  Improving  Engagement in Group:  Engaged  Modes of Intervention:  Clarification, Education and Support  Summary of Progress/Problems: Pt reported having a good conversation with his wife today. Pt shared that he plans on finding a new living situation and is grateful for his four children.   [facility] Rast S 06/28/2019, 9:06 PM

## 2019-06-28 NOTE — Progress Notes (Signed)
[facility]  06/28/2019 1:59 PM Bryson DamesKevin J Mccaskey  MRN:  [id number] Subjective: Patient is a [age] year old male with a past psychiatric history significant for major depression with one previous psychiatric admissions less than a week ago, and a re-presentation on 06/25/2019 with suicidal ideation.  Objective: Patient is seen and examined.  Patient is a [age] year old male familiar to me from his previous admission on 06/16/2019.  He apparently had been discharged on 06/21/2019.  He had return to the emergency department that same day for readmission.  He stated he does not feel well today physically.  He stated he has had a headache, and overall had a lack of energy.  Review of his laboratories showed that his blood sugar was stable.  We discussed the need for him to participate in his care, and to become more active and discharge planning.  He continued to endorse helplessness, hopelessness and worthlessness.  He stated that he is attempting to work with his wife over resolving some of their issues.  He continues on Cymbalta 40 mg p.o. daily, coated aspirin, gabapentin, hydrocodone, insulin, metoprolol, Protonix, and Lipitor.  His blood sugar this morning was approximately 130, and this afternoon it is 211.  His blood pressure is elevated at 147/84, pulse is 75.  He is afebrile.  He slept 6 hours last night.  Principal Problem: <principal problem not specified> Diagnosis: Active Problems:   MDD (major depressive disorder), recurrent episode, severe (HCC)  Total Time spent with patient: 20 minutes  Past Psychiatric History: See admission H&P  Past Medical History:  Past Medical History:  Diagnosis Date  . Barrett's esophagus   . CAD (coronary artery disease)    a. NSTEMI 05/2014 - occluded RCA dominant proximal s/p asp-thrombectomy/DES to RCA, minimal LAD/LCx, EF 50% by cath, 65-70% by echo.  . Depression   . Diabetes type 2, uncontrolled (HCC)   . Former tobacco use   . GERD (gastroesophageal  reflux disease)   . Hemorrhoids    hx of  . Hypercholesterolemia   . Hypertension   . MI (myocardial infarction) (HCC)   . Peripheral neuropathy     Past Surgical History:  Procedure Laterality Date  . LEFT HEART CATHETERIZATION WITH CORONARY ANGIOGRAM N/A 06/14/2014   Procedure: LEFT HEART CATHETERIZATION WITH CORONARY ANGIOGRAM;  Surgeon: Corky CraftsJayadeep S Varanasi, MD;  [facility];  Service: Cardiovascular;  Laterality: N/A;  . TONSILLECTOMY     Family History:  Family History  Problem Relation Age of Onset  . Hypertension Mother   . Alzheimer's disease Mother   . Alcohol abuse Father   . Cancer Father [id number]       "Throat"  . Diabetes type II Brother   . Cancer Brother 68       throat cancer  . Heart disease Neg Hx    Family Psychiatric  History: See admission H&P Social History:  Social History   Substance and Sexual Activity  Alcohol Use No  . Alcohol/week: 1.0 standard drinks  . Types: 1 Standard drinks or equivalent per week     Social History   Substance and Sexual Activity  Drug Use No    Social History   Socioeconomic History  . Marital status: Single    Spouse name: Not on file  . Number of children: Not on file  . Years of education: Not on file  . Highest education level: Not on file  Occupational History  . Occupation: unemployed    Comment: applying for  disability  Social Needs  . Financial resource strain: Not on file  . Food insecurity    Worry: Not on file    Inability: Not on file  . Transportation needs    Medical: Not on file    Non-medical: Not on file  Tobacco Use  . Smoking status: Former Smoker    Packs/day: 0.25    Years: 3.00    Pack years: 0.75    Quit date: 11/30/1998    Years since quitting: 20.5  . Smokeless tobacco: Never Used  Substance and Sexual Activity  . Alcohol use: No    Alcohol/week: 1.0 standard drinks    Types: 1 Standard drinks or equivalent per week  . Drug use: No  . Sexual activity: Yes    Birth  control/protection: None  Lifestyle  . Physical activity    Days per week: Not on file    Minutes per session: Not on file  . Stress: Not on file  Relationships  . Social Musicianconnections    Talks on phone: Not on file    Gets together: Not on file    Attends religious service: Not on file    Active member of club or organization: Not on file    Attends meetings of clubs or organizations: Not on file    Relationship status: Not on file  Other Topics Concern  . Not on file  Social History Narrative  . Not on file   Additional Social History:                         Sleep: Good  Appetite:  Fair  Current Medications: Current Facility-Administered Medications  Medication Dose Route Frequency Provider Last Rate Last Dose  . acetaminophen (TYLENOL) tablet 650 mg  650 mg Oral Q6H PRN Maryagnes AmosStarkes-Perry, Takia S, FNP   650 mg at 06/26/19 1214  . alum & mag hydroxide-simeth (MAALOX/MYLANTA) 200-200-20 MG/5ML suspension 30 mL  30 mL Oral Q4H PRN Rosario AdieStarkes-Perry, Juel Burrowakia S, FNP      . aspirin EC tablet 81 mg  81 mg Oral Daily Maryagnes AmosStarkes-Perry, Takia S, FNP   81 mg at 06/28/19 0752  . DULoxetine (CYMBALTA) DR capsule 40 mg  40 mg Oral Daily Cobos, Rockey SituFernando A, MD   40 mg at 06/28/19 0752  . gabapentin (NEURONTIN) capsule 300 mg  300 mg Oral BID Cobos, Rockey SituFernando A, MD   300 mg at 06/28/19 0752  . HYDROcodone-acetaminophen (NORCO/VICODIN) 5-325 MG per tablet 1 tablet  1 tablet Oral Q6H PRN Cobos, Rockey SituFernando A, MD   1 tablet at 06/28/19 0758  . hydrocortisone cream 0.5 %   Topical BID PRN Kerry HoughSimon, Spencer E, PA-C   1 application at 06/25/19 0831  . hydrOXYzine (ATARAX/VISTARIL) tablet 25 mg  25 mg Oral TID PRN Maryagnes AmosStarkes-Perry, Takia S, FNP      . insulin aspart (novoLOG) injection 0-5 Units  0-5 Units Subcutaneous QHS Oneta RackLewis, Tanika N, NP   3 Units at 06/27/19 2323  . insulin aspart (novoLOG) injection 0-9 Units  0-9 Units Subcutaneous TID WC Aldean BakerSykes, Janet E, NP   3 Units at 06/28/19 1212  . insulin detemir  (LEVEMIR) injection 30 Units  30 Units Subcutaneous BID Cobos, Rockey SituFernando A, MD   30 Units at 06/28/19 0756  . lisinopril (ZESTRIL) tablet 10 mg  10 mg Oral Daily Maryagnes AmosStarkes-Perry, Takia S, FNP   10 mg at 06/28/19 0752  . magnesium hydroxide (MILK OF MAGNESIA) suspension 30 mL  30 mL  Oral Daily PRN Maryagnes Amos, FNP      . metoprolol tartrate (LOPRESSOR) tablet 25 mg  25 mg Oral BID Maryagnes Amos, FNP   25 mg at 06/28/19 0752  . ondansetron (ZOFRAN-ODT) disintegrating tablet 4 mg  4 mg Oral Q8H PRN Aldean Baker, NP   4 mg at 06/27/19 1109  . pantoprazole (PROTONIX) EC tablet 40 mg  40 mg Oral Daily Maryagnes Amos, FNP   40 mg at 06/28/19 0752  . rosuvastatin (CRESTOR) tablet 40 mg  40 mg Oral Daily Maryagnes Amos, FNP   40 mg at 06/28/19 5366    Lab Results:  Results for orders placed or performed during the hospital encounter of 06/24/19 (from the past 48 hour(s))  Glucose, capillary     Status: Abnormal   Collection Time: 06/26/19  4:50 PM  Result Value Ref Range   Glucose-Capillary 308 (H) 70 - 99 mg/dL  Glucose, capillary     Status: Abnormal   Collection Time: 06/26/19  7:56 PM  Result Value Ref Range   Glucose-Capillary 322 (H) 70 - 99 mg/dL   Comment 1 Notify RN   Glucose, capillary     Status: Abnormal   Collection Time: 06/27/19  5:46 AM  Result Value Ref Range   Glucose-Capillary 172 (H) 70 - 99 mg/dL   Comment 1 Notify RN   Glucose, capillary     Status: Abnormal   Collection Time: 06/27/19 11:37 AM  Result Value Ref Range   Glucose-Capillary 220 (H) 70 - 99 mg/dL  Glucose, capillary     Status: Abnormal   Collection Time: 06/27/19  4:48 PM  Result Value Ref Range   Glucose-Capillary 251 (H) 70 - 99 mg/dL   Comment 1 Notify RN    Comment 2 Document in Chart   Glucose, capillary     Status: Abnormal   Collection Time: 06/27/19  8:16 PM  Result Value Ref Range   Glucose-Capillary 260 (H) 70 - 99 mg/dL   Comment 1 Notify RN    Comment 2  Document in Chart   Glucose, capillary     Status: Abnormal   Collection Time: 06/28/19  5:51 AM  Result Value Ref Range   Glucose-Capillary 131 (H) 70 - 99 mg/dL   Comment 1 Notify RN    Comment 2 Document in Chart   Glucose, capillary     Status: Abnormal   Collection Time: 06/28/19 12:06 PM  Result Value Ref Range   Glucose-Capillary 211 (H) 70 - 99 mg/dL    Blood Alcohol level:  Lab Results  Component Value Date   ETH <10 06/21/2019   ETH <10 06/16/2019    Metabolic Disorder Labs: Lab Results  Component Value Date   HGBA1C 9.7 (H) 06/25/2019   MPG 231.69 06/25/2019   MPG 228.82 06/16/2019   No results found for: PROLACTIN Lab Results  Component Value Date   CHOL 263 (H) 06/16/2019   TRIG 376 (H) 06/16/2019   HDL 37 (L) 06/16/2019   CHOLHDL 7.1 06/16/2019   VLDL 75 (H) 06/16/2019   LDLCALC 151 (H) 06/16/2019   LDLCALC 68 11/20/2018    Physical Findings: AIMS: Facial and Oral Movements Muscles of Facial Expression: None, normal Lips and Perioral Area: None, normal Jaw: None, normal Tongue: None, normal,Extremity Movements Upper (arms, wrists, hands, fingers): None, normal Lower (legs, knees, ankles, toes): None, normal, Trunk Movements Neck, shoulders, hips: None, normal, Overall Severity Severity of abnormal movements (highest score from questions  above): None, normal Incapacitation due to abnormal movements: None, normal Patient's awareness of abnormal movements (rate only patient's report): No Awareness, Dental Status Current problems with teeth and/or dentures?: No Does patient usually wear dentures?: No  CIWA:    COWS:     Musculoskeletal: Strength & Muscle Tone: within normal limits Gait & Station: normal Patient leans: N/A  Psychiatric Specialty Exam: Physical Exam  Nursing note and vitals reviewed. Constitutional: He is oriented to person, place, and time. He appears well-developed and well-nourished.  HENT:  Head: Normocephalic and  atraumatic.  Respiratory: Effort normal.  Neurological: He is alert and oriented to person, place, and time.    ROS  Blood pressure (!) 147/84, pulse 75, temperature 98 F (36.7 C), temperature source Oral, resp. rate 16, height 5\' 9"  (1.753 m), weight 104.8 kg, SpO2 98 %.Body mass index is 34.11 kg/m.  General Appearance: Casual  Eye Contact:  Fair  Speech:  Normal Rate  Volume:  Decreased  Mood:  Depressed  Affect:  Congruent  Thought Process:  Coherent and Descriptions of Associations: Circumstantial  Orientation:  Full (Time, Place, and Person)  Thought Content:  Logical  Suicidal Thoughts:  Yes.  without intent/plan  Homicidal Thoughts:  No  Memory:  Immediate;   Fair Recent;   Fair Remote;   Fair  Judgement:  Intact  Insight:  Fair  Psychomotor Activity:  Decreased  Concentration:  Concentration: Fair and Attention Span: Fair  Recall:  [employer] of Knowledge:  Fair  Language:  Good  Akathisia:  Negative  Handed:  Right  AIMS (if indicated):     Assets:  Desire for Improvement Resilience  ADL's:  Intact  Cognition:  WNL  Sleep:  Number of Hours: 6     Treatment Plan Summary: Daily contact with patient to assess and evaluate symptoms and progress in treatment, Medication management and Plan : Patient is a 63 year old male with the above-stated past psychiatric history who is seen in follow-up.   Diagnosis: #1 major depression, recurrent, severe without psychotic features, #2 generalized anxiety disorder, #3 poorly controlled diabetes mellitus type 1, #4 coronary artery disease, #5 Barrett's esophagus, #6 hyperlipidemia, #7 chronic pain, #8 marijuana use disorder  Patient is seen in follow-up.  He is essentially unchanged.  I am going to increase his Cymbalta to 60 mg p.o. daily.  I am going to increase his gabapentin to 300 mg p.o. 3 times daily.  I am also going to add NovoLog to 4 units 3 times daily AC.  We will continue the sliding scale.  His blood pressure is  elevated, and I will increase his lisinopril to 20 mg p.o. daily.  His most recent creatinine was at 0.86.  I have asked him to contact social work and begin participating more actively in his discharge planning.  No other changes in his current medications. 1.  Continue a coated aspirin 81 mg p.o. daily for heart health. 2.  Increase Cymbalta to 60 mg p.o. daily for mood and depression. 3.  Increase gabapentin to 300 mg p.o. 3 times daily for mood stability as well as peripheral neuropathy. 4.  Continue hydrocodone every 6 hours as needed pain. 5.  Continue hydroxyzine 25 mg p.o. 3 times daily as needed anxiety. 6.  Add NovoLog 4 units subcu 3 times daily AC for diabetes mellitus. 7.  Continue Lantus insulin 30 units subcu twice daily for diabetes. 8.  Continue Lopressor 25 mg p.o. twice daily for heart health and hypertension. 9.  Continue Zofran 4 mg every 8 hours as needed nausea. 10.  Continue Protonix 40 mg p.o. daily for GERD. 11.  Continue Crestor 40 mg p.o. daily for hyperlipidemia. 12.  Continue sliding scale insulin. 13.  Disposition planning-in progress  Antonieta PertGreg Lawson Clary, MD 06/28/2019, 1:59 PM

## 2019-06-28 NOTE — Progress Notes (Signed)
Patient ID: [name] [name], male   DOB: Feb 20, 1956, 63 y.o.   MRN: [id number] D: Patient calm and cooperative, mood is depressed, affect blunted, pt reported his sleep quality last night as fair, reported his appetite in the last 24 hrs as fair, reported his energy level as low, rates his depression as 7 (10 being the worst), rates his hopelessness level as 8 (10 being the worst), and rates his anxiety level today as 2 (10 being the worst). Pt also complaining of back pain of 6/10, and states that the most important thing for him today is "finding a place to go", and to "talk with social worker".  A: Patient is being given all of his medications as scheduled, and is being maintained on Q15 minute checks for safety.  Pt's being medicated with Hydrocodone for pain as needed.  R: Pt is being maintained on Q15 minute checks, and all concerns will be addressed as they arises.  Will continue to monitor.

## 2019-06-28 NOTE — Progress Notes (Signed)
[place] NOVEL CORONAVIRUS (COVID-19) DAILY CHECK-OFF SYMPTOMS - answer yes or no to each - every day NO YES  Have you had a fever in the past 24 hours?  . Fever (Temp > 37.80C / 100F) X   Have you had any of these symptoms in the past 24 hours? . New Cough .  Sore Throat  .  Shortness of Breath .  Difficulty Breathing .  Unexplained Body Aches   X   Have you had any one of these symptoms in the past 24 hours not related to allergies?   . Runny Nose .  Nasal Congestion .  Sneezing   X   If you have had runny nose, nasal congestion, sneezing in the past 24 hours, has it worsened?  X   EXPOSURES - check yes or no X   Have you traveled outside the state in the past 14 days?  X   Have you been in contact with someone with a confirmed diagnosis of COVID-19 or PUI in the past 14 days without wearing appropriate PPE?  X   Have you been living in the same home as a person with confirmed diagnosis of COVID-19 or a PUI (household contact)?    X   Have you been diagnosed with COVID-19?    X              What to do next: Answered NO to all: Answered YES to anything:   Proceed with unit schedule Follow the [facility] Inpatient Flowsheet.   

## 2019-06-29 LAB — GLUCOSE, CAPILLARY
Glucose-Capillary: 154 mg/dL — ABNORMAL HIGH (ref 70–99)
Glucose-Capillary: 146 mg/dL — ABNORMAL HIGH (ref 70–99)
Glucose-Capillary: 234 mg/dL — ABNORMAL HIGH (ref 70–99)
Glucose-Capillary: 222 mg/dL — ABNORMAL HIGH (ref 70–99)

## 2019-06-29 MED ORDER — ZOLPIDEM TARTRATE 5 MG PO TABS
5.0000 mg | ORAL_TABLET | Freq: Every evening | ORAL | Status: DC | PRN
  Filled 2019-06-29: qty 1

## 2019-06-29 NOTE — Plan of Care (Addendum)
Patient self inventory- patient slept fair last night, sleep medication was not requested. Appetite is good, energy level low, concentration poor. Depression, hopelessness, and anxiety rated 5, 3, 1. Denies SI HI AVH. Endorses physical pain in back and hips. Patient's goal is "finding housing, working with Education officer, museum." Patient is compliant with medications. Denies any side effects. Safety is maintained with 15 minute checks as well as environmental checks. Will continue to monitor and assess throughout the shift.  Problem: Education: Goal: Knowledge of [place] [facility] Education information/materials will improve Outcome: Progressing Goal: Mental status will improve Outcome: Progressing Goal: Verbalization of understanding the information provided will improve Outcome: Progressing   Problem: Coping: Goal: Ability to verbalize frustrations and anger appropriately will improve Outcome: Progressing Goal: Ability to demonstrate self-control will improve Outcome: Progressing

## 2019-06-29 NOTE — Progress Notes (Addendum)
Adult Psychoeducational Group Note  Date:  06/29/2019 Time:  9:47 AM  Group Topic/Focus:  Goals Group:   The focus of this group is to help patients establish daily goals to achieve during treatment and discuss how the patient can incorporate goal setting into their daily lives to aide in recovery. Orientation:   The focus of this group is to educate the patient on the purpose and policies of crisis stabilization and provide a format to answer questions about their admission.  The group details unit policies and expectations of patients while admitted.  Participation Level:  Minimal  Participation Quality:  Appropriate  Affect:  Flat  Cognitive:  Appropriate  Insight: Appropriate  Engagement in Group:  Resistant  Modes of Intervention:  Discussion and Education  Additional Comments:    Pt discussed what events led to their hospitalization. Pt worked on developing a Surveyor, quantity. The crisis plan lists triggers/ warning signs leading up to a crisis situation, people that the pt can reach out to for treatment, and coping skills to use.    Pt spoke to the group when asked a question, but he did not complete the worksheet given to him.   Lita Mains 06/29/2019, 9:47 AM

## 2019-06-29 NOTE — [facility] Group Notes (Signed)
LCSW Group Therapy Note 06/29/2019 2:30 PM  Type of Therapy/Topic: Group Therapy: Emotion Regulation  Participation Level: Minimal   Description of Group:  The purpose of this group is to assist patients in learning to regulate negative emotions and experience positive emotions. Patients will be guided to discuss ways in which they have been vulnerable to their negative emotions. These vulnerabilities will be juxtaposed with experiences of positive emotions or situations, and patients will be challenged to use positive emotions to combat negative ones. Special emphasis will be placed on coping with negative emotions in conflict situations, and patients will process healthy conflict resolution skills.  Therapeutic Goals: 1. Patient will identify two positive emotions or experiences to reflect on in order to balance out negative emotions 2. Patient will label two or more emotions that they find the most difficult to experience 3. Patient will demonstrate positive conflict resolution skills through discussion and/or role plays  Summary of Patient Progress:  [name] [name] was engaged throughout the group session, however his participation was minimal. He shared that he struggles regulating his emotions of fear and hopelessness. He did not disclose any further details or information during the group session.    Therapeutic Modalities:  Cognitive Behavioral Therapy Feelings Identification Dialectical Behavioral Therapy   Theresa Duty Clinical Social Worker

## 2019-06-29 NOTE — Progress Notes (Signed)
[facility]  06/29/2019 11:22 AM Bryson DamesKevin J Crager  MRN:  [id number] Subjective:  Patient is a 63 year old male with a past psychiatric history significant for major depression with one previous psychiatric admissions less than a week ago, and a re-presentation on 06/25/2019 with suicidal ideation.  Objective: Patient is seen and examined.  Patient is a 63 year old male familiar to me from his previous admission on 06/16/2019.  He apparently had been discharged on 06/21/2019.  Patient denied complaint today.  He stated he was a bit fatigued with the new medication changes.  He stated he is working on the game plan to be able [place].  He denied suicidal ideation today.  No other complaints.  His blood pressure was elevated earlier today, but when rechecked was stable.  His lisinopril was increased yesterday.  His blood pressure this morning was 140/81, pulse was 82, he is afebrile.  He only slept 4.25 hours last night.  Principal Problem: <principal problem not specified> Diagnosis: Active Problems:   MDD (major depressive disorder), recurrent episode, severe (HCC)  Total Time spent with patient: 20 minutes  Past Psychiatric History: See admission H&P  Past Medical History:  Past Medical History:  Diagnosis Date  . Barrett's esophagus   . CAD (coronary artery disease)    a. NSTEMI 05/2014 - occluded RCA dominant proximal s/p asp-thrombectomy/DES to RCA, minimal LAD/LCx, EF 50% by cath, 65-70% by echo.  . Depression   . Diabetes type 2, uncontrolled (HCC)   . Former tobacco use   . GERD (gastroesophageal reflux disease)   . Hemorrhoids    hx of  . Hypercholesterolemia   . Hypertension   . MI (myocardial infarction) (HCC)   . Peripheral neuropathy     Past Surgical History:  Procedure Laterality Date  . LEFT HEART CATHETERIZATION WITH CORONARY ANGIOGRAM N/A 06/14/2014   Procedure: LEFT HEART CATHETERIZATION WITH CORONARY ANGIOGRAM;  Surgeon: Corky CraftsJayadeep S  Varanasi, MD;  [facility];  Service: Cardiovascular;  Laterality: N/A;  . TONSILLECTOMY     Family History:  Family History  Problem Relation Age of Onset  . Hypertension Mother   . Alzheimer's disease Mother   . Alcohol abuse Father   . Cancer Father [id number]       "Throat"  . Diabetes type II Brother   . Cancer Brother 68       throat cancer  . Heart disease Neg Hx    Family Psychiatric  History: See admission H&P Social History:  Social History   Substance and Sexual Activity  Alcohol Use No  . Alcohol/week: 1.0 standard drinks  . Types: 1 Standard drinks or equivalent per week     Social History   Substance and Sexual Activity  Drug Use No    Social History   Socioeconomic History  . Marital status: Single    Spouse name: Not on file  . Number of children: Not on file  . Years of education: Not on file  . Highest education level: Not on file  Occupational History  . Occupation: unemployed    Comment: applying for disability  Social Needs  . Financial resource strain: Not on file  . Food insecurity    Worry: Not on file    Inability: Not on file  . Transportation needs    Medical: Not on file    Non-medical: Not on file  Tobacco Use  . Smoking status: Former Smoker    Packs/day: 0.25  Years: 3.00    Pack years: 0.75    Quit date: 11/30/1998    Years since quitting: 20.5  . Smokeless tobacco: Never Used  Substance and Sexual Activity  . Alcohol use: No    Alcohol/week: 1.0 standard drinks    Types: 1 Standard drinks or equivalent per week  . Drug use: No  . Sexual activity: Yes    Birth control/protection: None  Lifestyle  . Physical activity    Days per week: Not on file    Minutes per session: Not on file  . Stress: Not on file  Relationships  . Social Herbalist on phone: Not on file    Gets together: Not on file    Attends religious service: Not on file    Active member of club or organization: Not on file    Attends  meetings of clubs or organizations: Not on file    Relationship status: Not on file  Other Topics Concern  . Not on file  Social History Narrative  . Not on file   Additional Social History:                         Sleep: Fair  Appetite:  Fair  Current Medications: Current Facility-Administered Medications  Medication Dose Route Frequency Provider Last Rate Last Dose  . acetaminophen (TYLENOL) tablet 650 mg  650 mg Oral Q6H PRN Suella Broad, FNP   650 mg at 06/26/19 1214  . alum & mag hydroxide-simeth (MAALOX/MYLANTA) 200-200-20 MG/5ML suspension 30 mL  30 mL Oral Q4H PRN Burt Ek, Gayland Curry, FNP      . aspirin EC tablet 81 mg  81 mg Oral Daily Suella Broad, FNP   81 mg at 06/29/19 0758  . cholecalciferol (VITAMIN D3) tablet 1,000 Units  1,000 Units Oral Daily Sharma Covert, MD   1,000 Units at 06/29/19 [phone]  . DULoxetine (CYMBALTA) DR capsule 60 mg  60 mg Oral Daily Sharma Covert, MD   60 mg at 06/29/19 0758  . gabapentin (NEURONTIN) capsule 300 mg  300 mg Oral BID Cobos, Myer Peer, MD   300 mg at 06/29/19 0758  . HYDROcodone-acetaminophen (NORCO/VICODIN) 5-325 MG per tablet 1 tablet  1 tablet Oral Q6H PRN Cobos, Myer Peer, MD   1 tablet at 06/29/19 9604  . hydrocortisone cream 0.5 %   Topical BID PRN Laverle Hobby, PA-C   1 application at 54/09/81 0831  . hydrOXYzine (ATARAX/VISTARIL) tablet 25 mg  25 mg Oral TID PRN Suella Broad, FNP      . insulin aspart (novoLOG) injection 0-5 Units  0-5 Units Subcutaneous QHS Derrill Center, NP   5 Units at 06/28/19 2104  . insulin aspart (novoLOG) injection 0-9 Units  0-9 Units Subcutaneous TID WC Connye Burkitt, NP   2 Units at 06/29/19 [phone]  . insulin aspart (novoLOG) injection 4 Units  4 Units Subcutaneous TID WC Sharma Covert, MD   4 Units at 06/29/19 [phone]  . insulin detemir (LEVEMIR) injection 30 Units  30 Units Subcutaneous BID Cobos, Myer Peer, MD   30 Units at 06/29/19 0757   . lisinopril (ZESTRIL) tablet 20 mg  20 mg Oral Daily Sharma Covert, MD   20 mg at 06/29/19 0758  . magnesium hydroxide (MILK OF MAGNESIA) suspension 30 mL  30 mL Oral Daily PRN Suella Broad, FNP      . metoprolol tartrate (LOPRESSOR)  tablet 25 mg  25 mg Oral BID Maryagnes AmosStarkes-Perry, Takia S, FNP   25 mg at 06/29/19 0758  . ondansetron (ZOFRAN-ODT) disintegrating tablet 4 mg  4 mg Oral Q8H PRN Aldean BakerSykes, Janet E, NP   4 mg at 06/27/19 1109  . pantoprazole (PROTONIX) EC tablet 40 mg  40 mg Oral Daily Maryagnes AmosStarkes-Perry, Takia S, FNP   40 mg at 06/29/19 0758  . rosuvastatin (CRESTOR) tablet 40 mg  40 mg Oral Daily Maryagnes AmosStarkes-Perry, Takia S, FNP   40 mg at 06/29/19 [phone]    Lab Results:  Results for orders placed or performed during the hospital encounter of 06/24/19 (from the past 48 hour(s))  Glucose, capillary     Status: Abnormal   Collection Time: 06/27/19 11:37 AM  Result Value Ref Range   Glucose-Capillary 220 (H) 70 - 99 mg/dL  Glucose, capillary     Status: Abnormal   Collection Time: 06/27/19  4:48 PM  Result Value Ref Range   Glucose-Capillary 251 (H) 70 - 99 mg/dL   Comment 1 Notify RN    Comment 2 Document in Chart   Glucose, capillary     Status: Abnormal   Collection Time: 06/27/19  8:16 PM  Result Value Ref Range   Glucose-Capillary 260 (H) 70 - 99 mg/dL   Comment 1 Notify RN    Comment 2 Document in Chart   Glucose, capillary     Status: Abnormal   Collection Time: 06/28/19  5:51 AM  Result Value Ref Range   Glucose-Capillary 131 (H) 70 - 99 mg/dL   Comment 1 Notify RN    Comment 2 Document in Chart   Glucose, capillary     Status: Abnormal   Collection Time: 06/28/19 12:06 PM  Result Value Ref Range   Glucose-Capillary 211 (H) 70 - 99 mg/dL  Glucose, capillary     Status: Abnormal   Collection Time: 06/28/19  4:27 PM  Result Value Ref Range   Glucose-Capillary 337 (H) 70 - 99 mg/dL   Comment 1 Notify RN    Comment 2 Document in Chart   Glucose, capillary      Status: Abnormal   Collection Time: 06/28/19  8:51 PM  Result Value Ref Range   Glucose-Capillary 399 (H) 70 - 99 mg/dL   Comment 1 Notify RN    Comment 2 Document in Chart   Glucose, capillary     Status: Abnormal   Collection Time: 06/29/19  5:35 AM  Result Value Ref Range   Glucose-Capillary 154 (H) 70 - 99 mg/dL   Comment 1 Notify RN    Comment 2 Document in Chart     Blood Alcohol level:  Lab Results  Component Value Date   ETH <10 06/21/2019   ETH <10 06/16/2019    Metabolic Disorder Labs: Lab Results  Component Value Date   HGBA1C 9.7 (H) 06/25/2019   MPG 231.69 06/25/2019   MPG 228.82 06/16/2019   No results found for: PROLACTIN Lab Results  Component Value Date   CHOL 263 (H) 06/16/2019   TRIG 376 (H) 06/16/2019   HDL 37 (L) 06/16/2019   CHOLHDL 7.1 06/16/2019   VLDL 75 (H) 06/16/2019   LDLCALC 151 (H) 06/16/2019   LDLCALC 68 11/20/2018    Physical Findings: AIMS: Facial and Oral Movements Muscles of Facial Expression: None, normal Lips and Perioral Area: None, normal Jaw: None, normal Tongue: None, normal,Extremity Movements Upper (arms, wrists, hands, fingers): None, normal Lower (legs, knees, ankles, toes): None, normal, Trunk Movements  Neck, shoulders, hips: None, normal, Overall Severity Severity of abnormal movements (highest score from questions above): None, normal Incapacitation due to abnormal movements: None, normal Patient's awareness of abnormal movements (rate only patient's report): No Awareness, Dental Status Current problems with teeth and/or dentures?: No Does patient usually wear dentures?: No  CIWA:    COWS:     Musculoskeletal: Strength & Muscle Tone: within normal limits Gait & Station: normal Patient leans: N/A  Psychiatric Specialty Exam: Physical Exam  Nursing note and vitals reviewed. Constitutional: He is oriented to person, place, and time. He appears well-developed and well-nourished.  HENT:  Head: Normocephalic  and atraumatic.  Respiratory: Effort normal.  Neurological: He is alert and oriented to person, place, and time.    ROS  Blood pressure 140/81, pulse 82, temperature (!) 97.5 F (36.4 C), temperature source Oral, resp. rate 16, height 5\' 9"  (1.753 m), weight 104.8 kg, SpO2 97 %.Body mass index is 34.11 kg/m.  General Appearance: Casual  Eye Contact:  Fair  Speech:  Normal Rate  Volume:  Normal  Mood:  Anxious  Affect:  Congruent  Thought Process:  Coherent and Descriptions of Associations: Intact  Orientation:  Full (Time, Place, and Person)  Thought Content:  Logical  Suicidal Thoughts:  No  Homicidal Thoughts:  No  Memory:  Immediate;   Fair Recent;   Fair Remote;   Fair  Judgement:  Intact  Insight:  Fair  Psychomotor Activity:  Normal  Concentration:  Concentration: Fair and Attention Span: Fair  Recall:  [employer]Fair  Fund of Knowledge:  Good  Language:  Good  Akathisia:  Negative  Handed:  Right  AIMS (if indicated):     Assets:  Desire for Improvement Resilience  ADL's:  Intact  Cognition:  WNL  Sleep:  Number of Hours: 4.25     Treatment Plan Summary: Daily contact with patient to assess and evaluate symptoms and progress in treatment, Medication management and Plan : Patient is a 63 year old male with the above-stated past psychiatric history who is seen in follow-up.    Diagnosis: #1 major depression, recurrent, severe without psychotic features, #2 generalized anxiety disorder, #3 poorly controlled diabetes mellitus type 1, #4 coronary artery disease, #5 Barrett's esophagus, #6 hyperlipidemia, #7 chronic pain, #8 marijuana use disorder  Patient is seen in follow-up.  He is essentially unchanged.  He did not sleep well last night.  I will add low-dose Ambien to see if that helps him sleep a bit better.  His blood pressure looks like it is getting a little bit better with the changes in the medicines.  His Cymbalta was increased yesterday.  He is working with social work  to be able to get [place] [employer].  We will continue all those medications as currently written.  1.  Continue a coated aspirin 81 mg p.o. daily for heart health. 2.  Continue Cymbalta to 60 mg p.o. daily for mood and depression. 3.  Continue gabapentin to 300 mg p.o. 3 times daily for mood stability as well as peripheral neuropathy. 4.  Continue hydrocodone every 6 hours as needed pain. 5.  Continue hydroxyzine 25 mg p.o. 3 times daily as needed anxiety. 6.  Continue NovoLog 4 units subcu 3 times daily AC for diabetes mellitus. 7.  Continue Lantus insulin 30 units subcu twice daily for diabetes. 8.  Continue Lopressor 25 mg p.o. twice daily for heart health and hypertension. 9.  Continue Zofran 4 mg every 8 hours as needed nausea.  10.  Continue Protonix 40 mg p.o. daily for GERD. 11.  Continue Crestor 40 mg p.o. daily for hyperlipidemia. 12.  Continue sliding scale insulin. 13.    Add Ambien 5 mg p.o. nightly as needed insomnia. 14.  Disposition planning-in progress [name], MD 06/29/2019, 11:22 AM

## 2019-06-30 LAB — BASIC METABOLIC PANEL
Anion gap: 11 (ref 5–15)
BUN: 13 mg/dL (ref 8–23)
CO2: 23 mmol/L (ref 22–32)
Calcium: 8.9 mg/dL (ref 8.9–10.3)
Chloride: 105 mmol/L (ref 98–111)
Creatinine, Ser: 0.87 mg/dL (ref 0.61–1.24)
GFR calc Af Amer: >60 mL/min (ref >60)
GFR calc non Af Amer: >60 mL/min (ref >60)
Glucose, Bld: 218 mg/dL — ABNORMAL HIGH (ref 70–99)
Potassium: 3.9 mmol/L (ref 3.5–5.1)
Sodium: 139 mmol/L (ref 135–145)

## 2019-06-30 LAB — GLUCOSE, CAPILLARY: Glucose-Capillary: 213 mg/dL — ABNORMAL HIGH (ref 70–99)

## 2019-06-30 MED ORDER — INSULIN DETEMIR 100 UNIT/ML ~~LOC~~ SOLN: 30.0000 [IU] | Freq: Two times a day (BID) | SUBCUTANEOUS | 11 refills | Status: DC

## 2019-06-30 MED ORDER — LISINOPRIL 20 MG PO TABS: 20.0000 mg | ORAL_TABLET | Freq: Every day | ORAL | 0 refills | Status: DC

## 2019-06-30 MED ORDER — VITAMIN D3 25 MCG PO TABS: 1000.0000 [IU] | ORAL_TABLET | Freq: Every day | ORAL | 0 refills | Status: DC

## 2019-06-30 MED ORDER — ASPIRIN 81 MG PO TBEC: 81.0000 mg | DELAYED_RELEASE_TABLET | Freq: Every day | ORAL | 12 refills | Status: DC

## 2019-06-30 MED ORDER — ZOLPIDEM TARTRATE 5 MG PO TABS: 5.0000 mg | ORAL_TABLET | Freq: Every evening | ORAL | 0 refills | Status: DC | PRN

## 2019-06-30 MED ORDER — DULOXETINE HCL 60 MG PO CPEP: 60.0000 mg | ORAL_CAPSULE | Freq: Every day | ORAL | 0 refills | Status: DC

## 2019-06-30 MED ORDER — GABAPENTIN 300 MG PO CAPS: 300.0000 mg | ORAL_CAPSULE | Freq: Two times a day (BID) | ORAL | 0 refills | Status: DC

## 2019-06-30 MED ORDER — HYDROXYZINE HCL 25 MG PO TABS: 25.0000 mg | ORAL_TABLET | Freq: Three times a day (TID) | ORAL | 0 refills | Status: DC | PRN

## 2019-06-30 NOTE — Progress Notes (Signed)
Recreation Therapy Notes  Date:  7.31.20 Time: 0930 Location: [address] Dayroom  Group Topic: Stress Management  Goal Area(s) Addresses:  Patient will identify positive stress management techniques. Patient will identify benefits of using stress management post d/c.  Intervention: Stress Management  Activity : Guided Imagery.  LRT read a script that allowed patients to take a mental vacation.  Patients were to listen as the script was read to engage in the activity.   Education:  Stress Management, Discharge Planning.   Education Outcome: Acknowledges Education  Clinical Observations/Feedback: Pt did not attend group.     Victorino Sparrow, LRT/CTRS         Ria Comment, Carol Theys A 06/30/2019 10:56 AM

## 2019-06-30 NOTE — Progress Notes (Signed)
Pt denies SI/HI. Pt received both written and verbal discharge instructions. Pt verbalized understanding of discharge instructions. Pt agreed to f/u appt and med regimen. Pt received prescriptions, AVS, SRA and belongings from locker. Pt safely discharged to the lobby. Pt expressed picking his car up from [place] and driving to [place] rescue [facility].

## 2019-06-30 NOTE — Tx Team (Signed)
Interdisciplinary Treatment and Diagnostic Plan Update  06/30/2019 Time of Session:  [name] DamesKevin J [name] MRN: [id number]  Principal Diagnosis: <principal problem not specified>  Secondary Diagnoses: Active Problems:   MDD (major depressive disorder), recurrent episode, severe (HCC)   Current Medications:  Current Facility-Administered Medications  Medication Dose Route Frequency Provider Last Rate Last Dose  . acetaminophen (TYLENOL) tablet 650 mg  650 mg Oral Q6H PRN Maryagnes AmosStarkes-Perry, Takia S, FNP   650 mg at 06/26/19 1214  . alum & mag hydroxide-simeth (MAALOX/MYLANTA) 200-200-20 MG/5ML suspension 30 mL  30 mL Oral Q4H PRN Rosario AdieStarkes-Perry, Juel Burrowakia S, FNP      . aspirin EC tablet 81 mg  81 mg Oral Daily Maryagnes AmosStarkes-Perry, Takia S, FNP   81 mg at 06/30/19 [id number]  . cholecalciferol (VITAMIN D3) tablet 1,000 Units  1,000 Units Oral Daily Antonieta Pertlary, Greg Lawson, MD   1,000 Units at 06/30/19 [phone]  . DULoxetine (CYMBALTA) DR capsule 60 mg  60 mg Oral Daily Antonieta Pertlary, Greg Lawson, MD   60 mg at 06/30/19 [id number]  . gabapentin (NEURONTIN) capsule 300 mg  300 mg Oral BID Cobos, Rockey SituFernando A, MD   300 mg at 06/30/19 [id number]  . HYDROcodone-acetaminophen (NORCO/VICODIN) 5-325 MG per tablet 1 tablet  1 tablet Oral Q6H PRN Cobos, Rockey SituFernando A, MD   1 tablet at 06/30/19 0940  . hydrocortisone cream 0.5 %   Topical BID PRN Kerry HoughSimon, Spencer E, PA-C   1 application at 06/25/19 0831  . hydrOXYzine (ATARAX/VISTARIL) tablet 25 mg  25 mg Oral TID PRN Maryagnes AmosStarkes-Perry, Takia S, FNP      . insulin aspart (novoLOG) injection 0-5 Units  0-5 Units Subcutaneous QHS Oneta RackLewis, Tanika N, NP   2 Units at 06/29/19 2129  . insulin aspart (novoLOG) injection 0-9 Units  0-9 Units Subcutaneous TID WC Aldean BakerSykes, Janet E, NP   3 Units at 06/30/19 [phone]  . insulin aspart (novoLOG) injection 4 Units  4 Units Subcutaneous TID WC Antonieta Pertlary, Greg Lawson, MD   4 Units at 06/30/19 [phone]  . insulin detemir (LEVEMIR) injection 30 Units  30 Units Subcutaneous BID Cobos, Rockey SituFernando A, MD   30  Units at 06/30/19 [phone]  . lisinopril (ZESTRIL) tablet 20 mg  20 mg Oral Daily Antonieta Pertlary, Greg Lawson, MD   20 mg at 06/30/19 [phone]  . magnesium hydroxide (MILK OF MAGNESIA) suspension 30 mL  30 mL Oral Daily PRN Maryagnes AmosStarkes-Perry, Takia S, FNP      . metoprolol tartrate (LOPRESSOR) tablet 25 mg  25 mg Oral BID Maryagnes AmosStarkes-Perry, Takia S, FNP   25 mg at 06/30/19 [id number]  . ondansetron (ZOFRAN-ODT) disintegrating tablet 4 mg  4 mg Oral Q8H PRN Aldean BakerSykes, Janet E, NP   4 mg at 06/27/19 1109  . pantoprazole (PROTONIX) EC tablet 40 mg  40 mg Oral Daily Maryagnes AmosStarkes-Perry, Takia S, FNP   40 mg at 06/30/19 [id number]  . rosuvastatin (CRESTOR) tablet 40 mg  40 mg Oral Daily Maryagnes AmosStarkes-Perry, Takia S, FNP   40 mg at 06/30/19 [id number]  . zolpidem (AMBIEN) tablet 5 mg  5 mg Oral QHS PRN Antonieta Pertlary, Greg Lawson, MD       PTA Medications: Medications Prior to Admission  Medication Sig Dispense Refill Last Dose  . buPROPion (WELLBUTRIN XL) 150 MG 24 hr tablet Take 1 tablet (150 mg total) by mouth daily. 30 tablet 0   . calcium carbonate (TUMS EX) 750 MG chewable tablet Chew 2 tablets by mouth daily as needed for heartburn.      Burman Blacksmith. EQ ASPIRIN  ADULT LOW DOSE 81 MG EC tablet TAKE ONE TABLET BY MOUTH ONCE DAILY (Patient taking differently: Take 81 mg by mouth daily. ) 90 tablet 0   . gabapentin (NEURONTIN) 100 MG capsule Take 2 capsules (200 mg total) by mouth 2 (two) times daily. 120 capsule 0   . HYDROcodone-acetaminophen (NORCO) 7.5-325 MG tablet Take 1 tablet by mouth every 6 (six) hours as needed for moderate pain. 120 tablet 0   . hydrocortisone cream 0.5 % Apply topically 2 (two) times daily. 30 g 0   . insulin aspart (NOVOLOG) 100 UNIT/ML injection Inject 0-15 Units into the skin 3 (three) times daily before meals. 121-150=2 units, 151-200=4 units, 201-250=7 units, 251-300=9 units, 301-350=12 units, >351=15 units     . insulin detemir (LEVEMIR) 100 UNIT/ML injection Inject 0.2 mLs (20 Units total) into the skin 2 (two) times daily. 12 mL 0   .  lisinopril (ZESTRIL) 10 MG tablet Take 1 tablet (10 mg total) by mouth daily. 30 tablet 0   . metoprolol tartrate (LOPRESSOR) 25 MG tablet Take 1 tablet (25 mg total) by mouth 2 (two) times daily. 60 tablet 0   . nitroGLYCERIN (NITROSTAT) 0.4 MG SL tablet Place 1 tablet (0.4 mg total) under the tongue every 5 (five) minutes as needed for chest pain (up to 3 doses). 25 tablet 0   . pantoprazole (PROTONIX) 40 MG tablet Take 1 tablet (40 mg total) by mouth daily. 90 tablet 0   . rosuvastatin (CRESTOR) 40 MG tablet TAKE 1 TABLET(40 MG) BY MOUTH DAILY (Patient taking differently: Take 40 mg by mouth daily. ) 90 tablet 0     Patient Stressors: Health problems Marital or family conflict Substance abuse  Patient Strengths: General fund of knowledge Motivation for treatment/growth  Treatment Modalities: Medication Management, Group therapy, Case management,  1 to 1 session with clinician, Psychoeducation, Recreational therapy.   Physician Treatment Plan for Primary Diagnosis: <principal problem not specified> Long Term Goal(s): Improvement in symptoms so as ready for discharge Improvement in symptoms so as ready for discharge   Short Term Goals: Ability to identify changes in lifestyle to reduce recurrence of condition will improve Ability to verbalize feelings will improve Ability to disclose and discuss suicidal ideas Ability to demonstrate self-control will improve Ability to identify and develop effective coping behaviors will improve Ability to maintain clinical measurements within normal limits will improve Ability to identify changes in lifestyle to reduce recurrence of condition will improve Ability to verbalize feelings will improve Ability to disclose and discuss suicidal ideas Ability to demonstrate self-control will improve Ability to identify and develop effective coping behaviors will improve Ability to maintain clinical measurements within normal limits will  improve  Medication Management: Evaluate patient's response, side effects, and tolerance of medication regimen.  Therapeutic Interventions: 1 to 1 sessions, Unit Group sessions and Medication administration.  Evaluation of Outcomes: Adequate for Discharge  Physician Treatment Plan for Secondary Diagnosis: Active Problems:   MDD (major depressive disorder), recurrent episode, severe ([place])  Long Term Goal(s): Improvement in symptoms so as ready for discharge Improvement in symptoms so as ready for discharge   Short Term Goals: Ability to identify changes in lifestyle to reduce recurrence of condition will improve Ability to verbalize feelings will improve Ability to disclose and discuss suicidal ideas Ability to demonstrate self-control will improve Ability to identify and develop effective coping behaviors will improve Ability to maintain clinical measurements within normal limits will improve Ability to identify changes in lifestyle to reduce recurrence  of condition will improve Ability to verbalize feelings will improve Ability to disclose and discuss suicidal ideas Ability to demonstrate self-control will improve Ability to identify and develop effective coping behaviors will improve Ability to maintain clinical measurements within normal limits will improve     Medication Management: Evaluate patient's response, side effects, and tolerance of medication regimen.  Therapeutic Interventions: 1 to 1 sessions, Unit Group sessions and Medication administration.  Evaluation of Outcomes: Adequate for Discharge   RN Treatment Plan for Primary Diagnosis: <principal problem not specified> Long Term Goal(s): Knowledge of disease and therapeutic regimen to maintain health will improve  Short Term Goals: Ability to participate in decision making will improve, Ability to verbalize feelings will improve, Ability to disclose and discuss suicidal ideas, Ability to identify and develop  effective coping behaviors will improve and Compliance with prescribed medications will improve  Medication Management: RN will administer medications as ordered by provider, will assess and evaluate patient's response and provide education to patient for prescribed medication. RN will report any adverse and/or side effects to prescribing provider.  Therapeutic Interventions: 1 on 1 counseling sessions, Psychoeducation, Medication administration, Evaluate responses to treatment, Monitor vital signs and CBGs as ordered, Perform/monitor CIWA, COWS, AIMS and Fall Risk screenings as ordered, Perform wound care treatments as ordered.  Evaluation of Outcomes: Adequate for Discharge   LCSW Treatment Plan for Primary Diagnosis: <principal problem not specified> Long Term Goal(s): Safe transition to appropriate next level of care at discharge, Engage patient in therapeutic group addressing interpersonal concerns.  Short Term Goals: Engage patient in aftercare planning with referrals and resources  Therapeutic Interventions: Assess for all discharge needs, 1 to 1 time with Social worker, Explore available resources and support systems, Assess for adequacy in community support network, Educate family and significant other(s) on suicide prevention, Complete Psychosocial Assessment, Interpersonal group therapy.  Evaluation of Outcomes: Adequate for Discharge   Progress in Treatment: Attending groups: Yes.  Participating in groups: Yes. Taking medication as prescribed: Yes. Toleration medication: Yes. Family/Significant other contact made: No, will contact:  if patient consents to collateral contacts Patient understands diagnosis: Yes. Discussing patient identified problems/goals with staff: Yes. Medical problems stabilized or resolved: Yes. Denies suicidal/homicidal ideation: Yes. Issues/concerns per patient self-inventory: No. Other:   New problem(s) identified: None   New Short Term/Long Term  Goal(s): Detox, medication stabilization, elimination of SI thoughts, development of comprehensive mental wellness plan.    Patient Goals:    Discharge Plan or Barriers: Patient plans to discharge to the [facility] for housing resources. He will follow up with [facility] for medication management and therapy services.    Reason for Continuation of Hospitalization: None   Estimated Length of Stay: Discharging, 06/30/2019  Attendees: Patient: 06/30/2019 11:04 AM  Physician: Dr. Nehemiah Massed, MD 06/30/2019 11:04 AM  Nursing: Arlyss Repress.J, RN; Patrice.Lacretia Nicks, RN 06/30/2019 11:04 AM  RN Care Manager: 06/30/2019 11:04 AM  Social Worker: Baldo Daub, LCSWA 06/30/2019 11:04 AM  Recreational Therapist:  06/30/2019 11:04 AM  Other:  06/30/2019 11:04 AM  Other:  06/30/2019 11:04 AM  Other: 06/30/2019 11:04 AM    Scribe for Treatment Team: Maeola Sarah, LCSWA 06/30/2019 11:04 AM

## 2019-06-30 NOTE — Progress Notes (Signed)
  [facility] Adult Case Management Discharge Plan :  Will you be returning to the same living situation after discharge:  No. Patient is discharging to the [facility].  At discharge, do you have transportation home?: Yes,  patient reports his car is in the [employer] parking lot. Will walk across street to car and transport himself to [employer].  Do you have the ability to pay for your medications: Yes,  [employer] Medicare  Release of information consent forms completed and in the chart;  Patient's signature needed at discharge.  Patient to Follow up at: [place]. Schedule an appointment as soon as possible for a visit.   Specialty: Behavioral Health Why: Walk-in hours are Monday-Friday from 8:00am-5:00pm. Please be sure to bring your Photo ID, any proof of insurance and a list of your current medications. Please call number listed for any additional questions.  Contact information: [place] Follow up.   Contact information: [place], [place] [address]  NVBTY:[phone] Fax: N/A           Next level of care provider has access to [place] and Suicide Prevention discussed: Yes,  with the patient  Have you used any form of tobacco in the last 30 days? (Cigarettes, Smokeless Tobacco, Cigars, and/or Pipes): No  Has patient been referred to the Quitline?: N/A patient is not a smoker  Patient has been referred for addiction treatment: N/A  Marylee Floras, [place] 06/30/2019, 11:06 AM

## 2019-06-30 NOTE — Progress Notes (Signed)
[place] NOVEL CORONAVIRUS (COVID-19) DAILY CHECK-OFF SYMPTOMS - answer yes or no to each - every day NO YES  Have you had a fever in the past 24 hours?  . Fever (Temp > 37.80C / 100F) X   Have you had any of these symptoms in the past 24 hours? . New Cough .  Sore Throat  .  Shortness of Breath .  Difficulty Breathing .  Unexplained Body Aches   X   Have you had any one of these symptoms in the past 24 hours not related to allergies?   . Runny Nose .  Nasal Congestion .  Sneezing   X   If you have had runny nose, nasal congestion, sneezing in the past 24 hours, has it worsened?  X   EXPOSURES - check yes or no X   Have you traveled outside the state in the past 14 days?  X   Have you been in contact with someone with a confirmed diagnosis of COVID-19 or PUI in the past 14 days without wearing appropriate PPE?  X   Have you been living in the same home as a person with confirmed diagnosis of COVID-19 or a PUI (household contact)?    X   Have you been diagnosed with COVID-19?    X              What to do next: Answered NO to all: Answered YES to anything:   Proceed with unit schedule Follow the [facility] Inpatient Flowsheet.   

## 2019-06-30 NOTE — [facility] Suicide Risk Assessment (Signed)
[facility] Discharge Suicide Risk Assessment   Principal Problem: <principal problem not specified> Discharge Diagnoses: Active Problems:   MDD (major depressive disorder), recurrent episode, severe ([place])   Total Time spent with patient: 15 minutes  Musculoskeletal: Strength & Muscle Tone: within normal limits Gait & Station: normal Patient leans: N/A  Psychiatric Specialty Exam: Review of Systems  All other systems reviewed and are negative.   Blood pressure 127/81, pulse 90, temperature 98.4 F (36.9 C), temperature source Oral, resp. rate 16, height 5\' 9"  (1.753 m), weight 104.8 kg, SpO2 97 %.Body mass index is 34.11 kg/m.  General Appearance: Casual  Eye Contact::  Fair  Speech:  Normal Rate409  Volume:  Normal  Mood:  Anxious  Affect:  Congruent  Thought Process:  Coherent and Descriptions of Associations: Circumstantial  Orientation:  Full (Time, Place, and Person)  Thought Content:  Logical  Suicidal Thoughts:  No  Homicidal Thoughts:  No  Memory:  Immediate;   Fair Recent;   Fair Remote;   Fair  Judgement:  Intact  Insight:  Fair  Psychomotor Activity:  Normal  Concentration:  Fair  Recall:  [employer] of Knowledge:Fair  Language: Good  Akathisia:  Negative  Handed:  Right  AIMS (if indicated):     Assets:  Desire for Improvement Resilience  Sleep:  Number of Hours: 6.75  Cognition: WNL  ADL's:  Intact   Mental Status Per Nursing Assessment::   On Admission:  Suicidal ideation indicated by patient, Suicide plan  Demographic Factors:  Male, Low socioeconomic status, Living alone and Unemployed  Loss Factors: Decrease in vocational status and Financial problems/change in socioeconomic status  Historical Factors: Impulsivity  Risk Reduction Factors:   Sense of responsibility to family  Continued Clinical Symptoms:  Depression:   Impulsivity Medical Diagnoses and Treatments/Surgeries  Cognitive Features That Contribute To Risk:  Thought  constriction (tunnel vision)    Suicide Risk:  Minimal: No identifiable suicidal ideation.  Patients presenting with no risk factors but with morbid ruminations; may be classified as minimal risk based on the severity of the depressive symptoms    Plan Of Care/Follow-up recommendations:  Activity:  ad lib  Sharma Covert, MD 06/30/2019, 9:13 AM
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
## 2019-06-30 NOTE — Progress Notes (Signed)
Pt observe watching TV in dayroom. He denies SI/HI/AVH at this time. Pt appears blunted/depressed in affect and mood. He c/o of pain this evening. CBG at HS was 222. Support offered and safety maintained.

## 2019-07-02 ENCOUNTER — Telehealth: Payer: Self-pay | Admitting: Interventional Cardiology

## 2019-07-05 NOTE — Telephone Encounter (Signed)
Left message for patient to call back  

## 2019-07-07 ENCOUNTER — Encounter: Payer: Medicare HMO | Admitting: Registered Nurse

## 2019-07-13 NOTE — Telephone Encounter (Signed)
Called patient and scheduled appt with Dr. Irish Lack on 07/25/19 at 9:40 AM

## 2019-07-19 ENCOUNTER — Encounter: Payer: Medicare HMO | Admitting: Registered Nurse

## 2019-07-23 NOTE — Progress Notes (Deleted)
Cardiology Office Note   Date:  07/23/2019   ID:  [name] [name] 1956-08-29, MRN [id number]  PCP:  [name] Knapp, MD    No chief complaint on file.    Wt Readings from Last 3 Encounters:  06/21/19 229 lb 4.5 oz (104 kg)  06/21/19 231 lb (104.8 kg)  05/17/19 230 lb (104.3 kg)       History of Present Illness: [name] [name] is a 63 y.o. male  who had an inferior MI in July 2015.  He had a DES to the RCA placed.   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   He was hospitalized at [facility] due to depression.      Past Medical History:  Diagnosis Date  . Barrett's esophagus   . CAD (coronary artery disease)    a. NSTEMI 05/2014 - occluded RCA dominant proximal s/p asp-thrombectomy/DES to RCA, minimal LAD/LCx, EF 50% by cath, 65-70% by echo.  . Depression   . Diabetes type 2, uncontrolled ([place])   . Former tobacco use   . GERD (gastroesophageal reflux disease)   . Hemorrhoids    hx of  . Hypercholesterolemia   . Hypertension   . MI (myocardial infarction) ([place])   . Peripheral neuropathy     Past Surgical History:  Procedure Laterality Date  . LEFT HEART CATHETERIZATION WITH CORONARY ANGIOGRAM N/A 06/14/2014   Procedure: LEFT HEART CATHETERIZATION WITH CORONARY ANGIOGRAM;  Surgeon: [name] Booze, MD;  [facility] C[facility] LAB;  Service: Cardiovascular;  Laterality: N/A;  . TONSILLECTOMY       Current Outpatient Medications  Medication Sig Dispense Refill  . aspirin (EQ ASPIRIN ADULT LOW DOSE) 81 MG EC tablet Take 1 tablet (81 mg total) by mouth daily. Swallow whole: For heart health 30 tablet 12  . cholecalciferol (VITAMIN D) 25 MCG tablet Take 1 tablet (1,000 Units total) by mouth daily. For bone health 30 tablet 0  . DULoxetine (CYMBALTA) 60 MG capsule Take 1 capsule (60 mg total) by mouth daily. For depression 30 capsule 0  . gabapentin (NEURONTIN) 300 MG capsule Take 1 capsule (300 mg total) by mouth 2 (two) times daily. For pain/neuropathy 60  capsule 0  . HYDROcodone-acetaminophen (NORCO) 7.5-325 MG tablet Take 1 tablet by mouth every 6 (six) hours as needed for moderate pain. 120 tablet 0  . hydrocortisone cream 0.5 % Apply topically 2 (two) times daily. 30 g 0  . hydrOXYzine (ATARAX/VISTARIL) 25 MG tablet Take 1 tablet (25 mg total) by mouth 3 (three) times daily as needed for anxiety. 60 tablet 0  . insulin aspart (NOVOLOG) 100 UNIT/ML injection Inject 0-15 Units into the skin 3 (three) times daily before meals. 121-150=2 units, 151-200=4 units, 201-250=7 units, 251-300=9 units, 301-350=12 units, >351=15 units    . insulin detemir (LEVEMIR) 100 UNIT/ML injection Inject 0.3 mLs (30 Units total) into the skin 2 (two) times daily. For diabetes management 10 mL 11  . lisinopril (ZESTRIL) 20 MG tablet Take 1 tablet (20 mg total) by mouth daily. For high blood pressure 10 tablet 0  . metoprolol tartrate (LOPRESSOR) 25 MG tablet Take 1 tablet (25 mg total) by mouth 2 (two) times daily. 60 tablet 0  . nitroGLYCERIN (NITROSTAT) 0.4 MG SL tablet Place 1 tablet (0.4 mg total) under the tongue every 5 (five) minutes as needed for chest pain (up to 3 doses). 25 tablet 0  . pantoprazole (PROTONIX) 40 MG tablet Take 1 tablet (40 mg total) by mouth  daily. 90 tablet 0  . rosuvastatin (CRESTOR) 40 MG tablet TAKE 1 TABLET(40 MG) BY MOUTH DAILY (Patient taking differently: Take 40 mg by mouth daily. ) 90 tablet 0  . zolpidem (AMBIEN) 5 MG tablet Take 1 tablet (5 mg total) by mouth at bedtime as needed for sleep. 7 tablet 0   No current facility-administered medications for this visit.     Allergies:   Patient has no known allergies.    Social History:  The patient  reports that he quit smoking about 20 years ago. He has a 0.75 pack-year smoking history. He has never used smokeless tobacco. He reports that he does not drink alcohol or use drugs.   Family History:  The patient's ***family history includes Alcohol abuse in his father; Alzheimer's  disease in his mother; Cancer (age of onset: 52) in his father; Cancer (age of onset: 93) in his brother; Diabetes type II in his brother; Hypertension in his mother.    ROS:  Please see the history of present illness.   Otherwise, review of systems are positive for ***.   All other systems are reviewed and negative.    PHYSICAL EXAM: VS:  There were no vitals taken for this visit. , BMI There is no height or weight on file to calculate BMI. GEN: Well nourished, well developed, in no acute distress  HEENT: normal  Neck: no JVD, carotid bruits, or masses Cardiac: ***RRR; no murmurs, rubs, or gallops,no edema  Respiratory:  clear to auscultation bilaterally, normal work of breathing GI: soft, nontender, nondistended, + BS MS: no deformity or atrophy  Skin: warm and dry, no rash Neuro:  Strength and sensation are intact Psych: euthymic mood, full affect   EKG:   The ekg ordered today demonstrates ***   Recent Labs: 11/19/2018: B Natriuretic Peptide 10.2 06/16/2019: TSH 2.264 06/21/2019: Magnesium 2.1 06/22/2019: ALT 20; Hemoglobin 13.0; Platelets 357 06/30/2019: BUN 13; Creatinine, Ser 0.87; Potassium 3.9; Sodium 139   Lipid Panel    Component Value Date/Time   CHOL 263 (H) 06/16/2019 0655   CHOL 125 09/29/2017 1044   TRIG 376 (H) 06/16/2019 0655   HDL 37 (L) 06/16/2019 0655   HDL 40 09/29/2017 1044   CHOLHDL 7.1 06/16/2019 0655   VLDL 75 (H) 06/16/2019 0655   LDLCALC 151 (H) 06/16/2019 0655   LDLCALC 61 09/29/2017 1044     Other studies Reviewed: Additional studies/ records that were reviewed today with results demonstrating: ***.   ASSESSMENT AND PLAN:  1. CAD: s/p DES to RCA in 7/15.   2. Old MI: Inferior MI.  EF 50% at the time if cath.  3. Hyperlipidemia: 4. DM: A1C 9.7.   Current medicines are reviewed at length with the patient today.  The patient concerns regarding his medicines were addressed.  The following changes have been made:  No change***  Labs/  tests ordered today include: *** No orders of the defined types were placed in this encounter.   Recommend 150 minutes/week of aerobic exercise Low fat, low carb, high fiber diet recommended  Disposition:   FU in ***   Signed, Lance Muss, MD  07/23/2019 11:06 AM    [facility] [address] [place], [place], [place]  [address] Phone: [phone]; Fax: [phone]

## 2019-07-25 ENCOUNTER — Inpatient Hospital Stay: Payer: Medicare HMO | Admitting: Registered Nurse

## 2019-07-25 ENCOUNTER — Encounter: Payer: Medicare HMO | Admitting: Registered Nurse

## 2019-07-25 ENCOUNTER — Ambulatory Visit: Payer: Medicare HMO | Admitting: Interventional Cardiology

## 2019-07-26 ENCOUNTER — Other Ambulatory Visit: Payer: Self-pay

## 2019-07-26 ENCOUNTER — Encounter: Payer: Medicare HMO | Attending: Physical Medicine & Rehabilitation | Admitting: Registered Nurse

## 2019-07-26 VITALS — BP 145/76 | HR 98 | Temp 98.7°F | Resp 14 | Ht 69.0 in | Wt 223.0 lb

## 2019-07-26 DIAGNOSIS — Z5181 Encounter for therapeutic drug level monitoring: Secondary | ICD-10-CM | POA: Diagnosis not present

## 2019-07-26 DIAGNOSIS — M799 Soft tissue disorder, unspecified: Secondary | ICD-10-CM | POA: Diagnosis not present

## 2019-07-26 DIAGNOSIS — Z79899 Other long term (current) drug therapy: Secondary | ICD-10-CM | POA: Diagnosis not present

## 2019-07-26 DIAGNOSIS — M25551 Pain in right hip: Secondary | ICD-10-CM | POA: Diagnosis not present

## 2019-07-26 DIAGNOSIS — Z79891 Long term (current) use of opiate analgesic: Secondary | ICD-10-CM | POA: Diagnosis not present

## 2019-07-26 DIAGNOSIS — M7989 Other specified soft tissue disorders: Secondary | ICD-10-CM | POA: Diagnosis not present

## 2019-07-26 DIAGNOSIS — M7061 Trochanteric bursitis, right hip: Secondary | ICD-10-CM

## 2019-07-26 DIAGNOSIS — E119 Type 2 diabetes mellitus without complications: Secondary | ICD-10-CM | POA: Insufficient documentation

## 2019-07-26 DIAGNOSIS — G63 Polyneuropathy in diseases classified elsewhere: Secondary | ICD-10-CM

## 2019-07-26 DIAGNOSIS — M25552 Pain in left hip: Secondary | ICD-10-CM | POA: Insufficient documentation

## 2019-07-26 DIAGNOSIS — M7062 Trochanteric bursitis, left hip: Secondary | ICD-10-CM | POA: Diagnosis not present

## 2019-07-26 DIAGNOSIS — G8929 Other chronic pain: Secondary | ICD-10-CM | POA: Diagnosis not present

## 2019-07-26 DIAGNOSIS — G894 Chronic pain syndrome: Secondary | ICD-10-CM | POA: Insufficient documentation

## 2019-07-26 DIAGNOSIS — M545 Low back pain: Secondary | ICD-10-CM

## 2019-07-26 NOTE — Progress Notes (Signed)
Subjective:    Patient ID: [name] [name], male    DOB: [birth year], 64 y.o.   MRN: [id number]  HPI: [name] [name] is a 63 y.o. male who returns for follow up appointment for chronic pain and medication refill. He states his pain is located in his lower back,  bilateral hips and bilateral feet with burning. Marland Kitchen He rates his pain 6. His current exercise regime is walking and performing stretching exercises.  [name] [name] has housing and working on a school grant he reports.   [name] [name] was hospitalized on 06/24/2019 and discharged on 06/30/2019, with recent hospitalization with suicidal ideation triggering suicide attempt, we will not resume his analgesics. [name] [name] understanding.    Pain Inventory Average Pain 3 Pain Right Now 6 My pain is aching  In the last 24 hours, has pain interfered with the following? General activity 4 Relation with others 2 Enjoyment of life 2 What TIME of day is your pain at its worst? morning Sleep (in general) Poor  Pain is worse with: walking and some activites Pain improves with: medication and injections Relief from Meds: 7  Mobility walk without assistance how many minutes can you walk? 10 ability to climb steps?  yes do you drive?  yes  Function disabled: date disabled 64 I need assistance with the following:  dressing  Neuro/Psych weakness numbness tingling spasms  Prior Studies Any changes since last visit?  no  Physicians involved in your care Any changes since last visit?  no   Family History  Problem Relation Age of Onset  . Hypertension Mother   . Alzheimer's disease Mother   . Alcohol abuse Father   . Cancer Father 23       "Throat"  . Diabetes type II Brother   . Cancer Brother 68       throat cancer  . Heart disease Neg Hx    Social History   Socioeconomic History  . Marital status: Single    Spouse name: Not on file  . Number of children: Not on file  . Years of education: Not on file  . Highest  education level: Not on file  Occupational History  . Occupation: unemployed    Comment: applying for disability  Social Needs  . Financial resource strain: Not on file  . Food insecurity    Worry: Not on file    Inability: Not on file  . Transportation needs    Medical: Not on file    Non-medical: Not on file  Tobacco Use  . Smoking status: Former Smoker    Packs/day: 0.25    Years: 3.00    Pack years: 0.75    Quit date: 11/30/1998    Years since quitting: 20.6  . Smokeless tobacco: Never Used  Substance and Sexual Activity  . Alcohol use: No    Alcohol/week: 1.0 standard drinks    Types: 1 Standard drinks or equivalent per week  . Drug use: No  . Sexual activity: Yes    Birth control/protection: None  Lifestyle  . Physical activity    Days per week: Not on file    Minutes per session: Not on file  . Stress: Not on file  Relationships  . Social Musician on phone: Not on file    Gets together: Not on file    Attends religious service: Not on file    Active member of club or organization: Not on file    Attends  meetings of clubs or organizations: Not on file    Relationship status: Not on file  Other Topics Concern  . Not on file  Social History Narrative  . Not on file   Past Surgical History:  Procedure Laterality Date  . LEFT HEART CATHETERIZATION WITH CORONARY ANGIOGRAM N/A 06/14/2014   Procedure: LEFT HEART CATHETERIZATION WITH CORONARY ANGIOGRAM;  Surgeon: Jettie Booze, MD;  [facility];  Service: Cardiovascular;  Laterality: N/A;  . TONSILLECTOMY     Past Medical History:  Diagnosis Date  . Barrett's esophagus   . CAD (coronary artery disease)    a. NSTEMI 05/2014 - occluded RCA dominant proximal s/p asp-thrombectomy/DES to RCA, minimal LAD/LCx, EF 50% by cath, 65-70% by echo.  . Depression   . Diabetes type 2, uncontrolled ([place])   . Former tobacco use   . GERD (gastroesophageal reflux disease)   . Hemorrhoids    hx of  .  Hypercholesterolemia   . Hypertension   . MI (myocardial infarction) ([place])   . Peripheral neuropathy    There were no vitals taken for this visit.  Opioid Risk Score:   Fall Risk Score:  `1  Depression screen PHQ 2/9  Depression screen [facility] 2/9 09/12/2018 06/10/2018 04/12/2018 03/11/2018 10/01/2017 09/29/2017 03/08/2017  Decreased Interest 0 0 0 0 0 0 0  Down, Depressed, Hopeless 0 0 0 0 0 0 0  PHQ - 2 Score 0 0 0 0 0 0 0  Altered sleeping - - - - - - -  Tired, decreased energy - - - - - - -  Change in appetite - - - - - - -  Feeling bad or failure about yourself  - - - - - - -  Trouble concentrating - - - - - - -  Moving slowly or fidgety/restless - - - - - - -  Suicidal thoughts - - - - - - -  PHQ-9 Score - - - - - - -  Difficult doing work/chores - - - - - - -  Some recent data might be hidden     Review of Systems  Constitutional: Negative.   HENT: Negative.   Eyes: Negative.   Respiratory: Negative.   Cardiovascular: Negative.   Gastrointestinal: Negative.   Endocrine: Negative.   Genitourinary: Negative.   Musculoskeletal: Positive for back pain and myalgias.  Skin: Negative.   Allergic/Immunologic: Negative.   Neurological: Positive for weakness and numbness.  Hematological: Negative.   Psychiatric/Behavioral: Negative.   All other systems reviewed and are negative.      Objective:   Physical Exam Vitals signs and nursing note reviewed.  Constitutional:      Appearance: Normal appearance.  Neck:     Musculoskeletal: Normal range of motion and neck supple.  Cardiovascular:     Rate and Rhythm: Normal rate and regular rhythm.     Pulses: Normal pulses.     Heart sounds: Normal heart sounds.  Pulmonary:     Effort: Pulmonary effort is normal.     Breath sounds: Normal breath sounds.  Musculoskeletal:     Comments: Normal Muscle Bulk and Muscle Testing Reveals: Upper Extremities: Full ROM and Muscle Strength 5/5   Lumbar Paraspinal Tenderness: L-4-L-5  Bilateral Greater Trochanter tenderness Arises from Table with ease Narrow Based  Gait   Skin:    General: Skin is warm and dry.  Neurological:     Mental Status: He is alert and oriented to person, place, and time.  Psychiatric:        Mood and Affect: Mood normal.        Behavior: Behavior normal.           Assessment & Plan:  1.Bilateral hip pain. 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 Ortho Following: Dr. Thurston HoleWainer regarding right hip placement.07/26/2019 We will continue to monitor.  2. Cervicalgia: Ortho Following: No complaints today. Continue with HEP as tolerated. Continue to Monitor.07/26/2019 3. Type II DM: Polyneuropathy:Continuecurrent medication regime withGabapentin:PCP Following. 07/26/2019 4. Lumbar Radiculitis: Continuecurrent medication regimen withGabapentin. 07/26/2019. 5. Bilateral Ankle Joint Pain: No complaints today. Continue current medication regime. 6. Depression: Psychiatry Following.   F/U [id number]

## 2019-07-30 ENCOUNTER — Encounter: Payer: Self-pay | Admitting: Registered Nurse

## 2019-07-30 LAB — TOXASSURE SELECT,+ANTIDEPR,UR

## 2019-07-31 ENCOUNTER — Telehealth: Payer: Self-pay | Admitting: *Deleted

## 2019-07-31 NOTE — Telephone Encounter (Signed)
Urine drug screen is postivie for metabolite THC from marijuana. He is no longer being prescribed opioids.

## 2019-08-17 NOTE — Telephone Encounter (Signed)
Since patient no-showed to his appt on 8/25. I called and left a message for patient to call back an reschedule.

## 2019-10-23 ENCOUNTER — Encounter: Payer: Medicare HMO | Admitting: Registered Nurse

## 2019-10-30 ENCOUNTER — Inpatient Hospital Stay (HOSPITAL_COMMUNITY)
Admission: RE | Admit: 2019-10-30 | Discharge: 2019-11-06 | DRG: 885 | Disposition: A | Discharge status: Home / Self Care | Payer: Medicare HMO | Attending: Psychiatry | Admitting: Psychiatry

## 2019-10-30 ENCOUNTER — Other Ambulatory Visit: Payer: Self-pay

## 2019-10-30 ENCOUNTER — Encounter (HOSPITAL_COMMUNITY): Payer: Self-pay | Admitting: *Deleted

## 2019-10-30 DIAGNOSIS — G4700 Insomnia, unspecified: Secondary | ICD-10-CM | POA: Diagnosis present

## 2019-10-30 DIAGNOSIS — R45851 Suicidal ideations: Secondary | ICD-10-CM | POA: Diagnosis present

## 2019-10-30 DIAGNOSIS — Z20828 Contact with and (suspected) exposure to other viral communicable diseases: Secondary | ICD-10-CM | POA: Diagnosis present

## 2019-10-30 DIAGNOSIS — I10 Essential (primary) hypertension: Secondary | ICD-10-CM | POA: Diagnosis present

## 2019-10-30 DIAGNOSIS — F332 Major depressive disorder, recurrent severe without psychotic features: Secondary | ICD-10-CM | POA: Diagnosis present

## 2019-10-30 DIAGNOSIS — I25119 Atherosclerotic heart disease of native coronary artery with unspecified angina pectoris: Secondary | ICD-10-CM | POA: Diagnosis present

## 2019-10-30 DIAGNOSIS — F419 Anxiety disorder, unspecified: Secondary | ICD-10-CM | POA: Diagnosis present

## 2019-10-30 DIAGNOSIS — Z87891 Personal history of nicotine dependence: Secondary | ICD-10-CM

## 2019-10-30 DIAGNOSIS — E1165 Type 2 diabetes mellitus with hyperglycemia: Secondary | ICD-10-CM | POA: Diagnosis present

## 2019-10-30 DIAGNOSIS — R000 Tachycardia, unspecified: Secondary | ICD-10-CM | POA: Diagnosis present

## 2019-10-30 DIAGNOSIS — K219 Gastro-esophageal reflux disease without esophagitis: Secondary | ICD-10-CM | POA: Diagnosis present

## 2019-10-30 HISTORY — DX: Anxiety disorder, unspecified: F41.9

## 2019-10-30 LAB — CBC
HCT: 43.1 % (ref 39.0–52.0)
Hemoglobin: 13.4 g/dL (ref 13.0–17.0)
MCH: 27 pg (ref 26.0–34.0)
MCHC: 31.1 g/dL (ref 30.0–36.0)
MCV: 86.9 fL (ref 80.0–100.0)
Platelets: 261 10*3/uL (ref 150–400)
RBC: 4.96 MIL/uL (ref 4.22–5.81)
RDW: 13.6 % (ref 11.5–15.5)
WBC: 5 10*3/uL (ref 4.0–10.5)
nRBC: 0 % (ref 0.0–0.2)

## 2019-10-30 LAB — SARS CORONAVIRUS 2 BY RT PCR (HOSPITAL ORDER, PERFORMED IN ~~LOC~~ HOSPITAL LAB): SARS Coronavirus 2: NEGATIVE

## 2019-10-30 LAB — GLUCOSE, CAPILLARY
Glucose-Capillary: 304 mg/dL — ABNORMAL HIGH (ref 70–99)
Glucose-Capillary: 271 mg/dL — ABNORMAL HIGH (ref 70–99)
Glucose-Capillary: 320 mg/dL — ABNORMAL HIGH (ref 70–99)
Glucose-Capillary: 311 mg/dL — ABNORMAL HIGH (ref 70–99)
Glucose-Capillary: 229 mg/dL — ABNORMAL HIGH (ref 70–99)

## 2019-10-30 LAB — COMPREHENSIVE METABOLIC PANEL
ALT: 13 U/L (ref 0–44)
AST: 21 U/L (ref 15–41)
Albumin: 4.3 g/dL (ref 3.5–5.0)
Alkaline Phosphatase: 101 U/L (ref 38–126)
Anion gap: 11 (ref 5–15)
BUN: 14 mg/dL (ref 8–23)
CO2: 21 mmol/L — ABNORMAL LOW (ref 22–32)
Calcium: 9 mg/dL (ref 8.9–10.3)
Chloride: 106 mmol/L (ref 98–111)
Creatinine, Ser: 0.82 mg/dL (ref 0.61–1.24)
GFR calc Af Amer: >60 mL/min (ref >60)
GFR calc non Af Amer: >60 mL/min (ref >60)
Glucose, Bld: 285 mg/dL — ABNORMAL HIGH (ref 70–99)
Potassium: 4.1 mmol/L (ref 3.5–5.1)
Sodium: 138 mmol/L (ref 135–145)
Total Bilirubin: 0.4 mg/dL (ref 0.3–1.2)
Total Protein: 7.2 g/dL (ref 6.5–8.1)

## 2019-10-30 LAB — HEMOGLOBIN A1C
Hgb A1c MFr Bld: 9.4 % — ABNORMAL HIGH (ref 4.8–5.6)
Mean Plasma Glucose: 223.08 mg/dL

## 2019-10-30 LAB — TSH: TSH: 2.228 u[IU]/mL (ref 0.350–4.500)

## 2019-10-30 MED ORDER — METOPROLOL TARTRATE 25 MG PO TABS
25.0000 mg | ORAL_TABLET | Freq: Two times a day (BID) | ORAL | Status: DC
  Administered 2019-10-30 – 2019-11-01 (×4): 25 mg via ORAL
  Filled 2019-10-30 (×8): qty 1

## 2019-10-30 MED ORDER — HYDROCODONE-ACETAMINOPHEN 5-325 MG PO TABS
1.0000 | ORAL_TABLET | Freq: Two times a day (BID) | ORAL | Status: DC | PRN
  Administered 2019-10-30 – 2019-11-06 (×14): 1 via ORAL
  Filled 2019-10-30 (×14): qty 1

## 2019-10-30 MED ORDER — INSULIN ASPART 100 UNIT/ML ~~LOC~~ SOLN
0.0000 [IU] | Freq: Three times a day (TID) | SUBCUTANEOUS | Status: DC
  Administered 2019-10-30 (×2): 7 [IU] via SUBCUTANEOUS
  Administered 2019-10-30: 07:00:00 5 [IU] via SUBCUTANEOUS
  Administered 2019-10-31: 1 [IU] via SUBCUTANEOUS
  Administered 2019-10-31 – 2019-11-01 (×3): 2 [IU] via SUBCUTANEOUS
  Administered 2019-11-01: 5 [IU] via SUBCUTANEOUS
  Administered 2019-11-02: 9 [IU] via SUBCUTANEOUS
  Administered 2019-11-02 – 2019-11-04 (×5): 3 [IU] via SUBCUTANEOUS
  Administered 2019-11-04: 2 [IU] via SUBCUTANEOUS
  Administered 2019-11-04: 1 [IU] via SUBCUTANEOUS
  Administered 2019-11-05 (×3): 2 [IU] via SUBCUTANEOUS
  Administered 2019-11-06 (×2): 3 [IU] via SUBCUTANEOUS

## 2019-10-30 MED ORDER — INSULIN ASPART 100 UNIT/ML ~~LOC~~ SOLN
8.0000 [IU] | Freq: Three times a day (TID) | SUBCUTANEOUS | Status: DC
  Administered 2019-10-30 – 2019-11-03 (×11): 8 [IU] via SUBCUTANEOUS

## 2019-10-30 MED ORDER — GABAPENTIN 100 MG PO CAPS
100.0000 mg | ORAL_CAPSULE | Freq: Two times a day (BID) | ORAL | Status: DC
  Administered 2019-10-30 – 2019-11-01 (×4): 100 mg via ORAL
  Filled 2019-10-30 (×7): qty 1

## 2019-10-30 MED ORDER — ASPIRIN 81 MG PO TBEC
81.0000 mg | DELAYED_RELEASE_TABLET | Freq: Every day | ORAL | Status: DC
  Administered 2019-10-30 – 2019-11-06 (×8): 81 mg via ORAL
  Filled 2019-10-30 (×11): qty 1

## 2019-10-30 MED ORDER — SERTRALINE HCL 50 MG PO TABS
50.0000 mg | ORAL_TABLET | Freq: Every day | ORAL | Status: DC
  Administered 2019-10-30 – 2019-11-02 (×4): 50 mg via ORAL
  Filled 2019-10-30 (×8): qty 1

## 2019-10-30 MED ORDER — ALUM & MAG HYDROXIDE-SIMETH 200-200-20 MG/5ML PO SUSP: 30.0000 mL | ORAL | Status: DC | PRN

## 2019-10-30 MED ORDER — INSULIN DETEMIR 100 UNIT/ML ~~LOC~~ SOLN
40.0000 [IU] | Freq: Two times a day (BID) | SUBCUTANEOUS | Status: DC
  Administered 2019-10-30 – 2019-11-01 (×5): 40 [IU] via SUBCUTANEOUS

## 2019-10-30 MED ORDER — GABAPENTIN 100 MG PO CAPS
200.0000 mg | ORAL_CAPSULE | Freq: Two times a day (BID) | ORAL | Status: DC
  Administered 2019-10-30: 09:00:00 200 mg via ORAL
  Filled 2019-10-30 (×6): qty 2

## 2019-10-30 MED ORDER — PANTOPRAZOLE SODIUM 40 MG PO TBEC
40.0000 mg | DELAYED_RELEASE_TABLET | Freq: Every day | ORAL | Status: DC
  Administered 2019-10-30 – 2019-11-06 (×8): 40 mg via ORAL
  Filled 2019-10-30 (×11): qty 1

## 2019-10-30 MED ORDER — LISINOPRIL 20 MG PO TABS
20.0000 mg | ORAL_TABLET | Freq: Every day | ORAL | Status: DC
  Administered 2019-10-30 – 2019-11-03 (×5): 20 mg via ORAL
  Filled 2019-10-30 (×8): qty 1

## 2019-10-30 MED ORDER — ROSUVASTATIN CALCIUM 40 MG PO TABS
40.0000 mg | ORAL_TABLET | Freq: Every day | ORAL | Status: DC
  Administered 2019-10-30 – 2019-11-06 (×8): 40 mg via ORAL
  Filled 2019-10-30 (×10): qty 1

## 2019-10-30 MED ORDER — ACETAMINOPHEN 325 MG PO TABS: 650.0000 mg | ORAL_TABLET | Freq: Four times a day (QID) | ORAL | Status: DC | PRN

## 2019-10-30 MED ORDER — HYDROXYZINE HCL 25 MG PO TABS: 25.0000 mg | ORAL_TABLET | Freq: Three times a day (TID) | ORAL | Status: DC | PRN

## 2019-10-30 MED ORDER — MAGNESIUM HYDROXIDE 400 MG/5ML PO SUSP: 30.0000 mL | Freq: Every day | ORAL | Status: DC | PRN

## 2019-10-30 NOTE — H&P (Signed)
Behavioral Health Medical Screening Exam  [name] [name] is an 63 y.o. male.  Total Time spent with patient: 20 minutes  Psychiatric Specialty Exam: Physical Exam  Constitutional: He is oriented to person, place, and time. He appears well-developed and well-nourished. No distress.  HENT:  Head: Normocephalic and atraumatic.  Right Ear: External ear normal.  Left Ear: External ear normal.  Eyes: Pupils are equal, round, and reactive to light. Right eye exhibits no discharge. Left eye exhibits no discharge.  Respiratory: Effort normal. No respiratory distress.  Musculoskeletal: Normal range of motion.  Neurological: He is alert and oriented to person, place, and time.  Skin: He is not diaphoretic.  Psychiatric: His mood appears anxious. He is not withdrawn and not actively hallucinating. Thought content is not paranoid and not delusional. He exhibits a depressed mood. He expresses suicidal ideation. He expresses no homicidal ideation.    Review of Systems  Constitutional: Negative for chills, diaphoresis, fever, malaise/fatigue and weight loss.  Respiratory: Negative for cough and shortness of breath.   Cardiovascular: Negative for chest pain.  Gastrointestinal: Negative for diarrhea, nausea and vomiting.  Musculoskeletal: Positive for joint pain.  Psychiatric/Behavioral: Positive for depression and suicidal ideas. Negative for hallucinations, memory loss and substance abuse. The patient is nervous/anxious and has insomnia.     There were no vitals taken for this visit.There is no height or weight on file to calculate BMI.  General Appearance: Casual and Fairly Groomed  Eye Contact:  Minimal  Speech:  Clear and Coherent  Volume:  Decreased  Mood:  Anxious, Depressed, Hopeless and Worthless  Affect:  Congruent and Depressed  Thought Process:  Coherent, Goal Directed, Linear and Descriptions of Associations: Intact  Orientation:  Full (Time, Place, and Person)  Thought Content:   Logical and Hallucinations: None  Suicidal Thoughts:  Yes.  without intent/plan  Homicidal Thoughts:  No  Memory:  Immediate;   Good Recent;   Good  Judgement:  Fair  Insight:  Lacking  Psychomotor Activity:  Psychomotor Retardation  Concentration: Concentration: Fair  Recall:  Good  Fund of Knowledge:Good  Language: Good  Akathisia:  Negative  Handed:  Right  AIMS (if indicated):     Assets:  Communication Skills Desire for Improvement Leisure Time  Sleep:       Musculoskeletal: Strength & Muscle Tone: within normal limits Gait & Station: normal Patient leans: N/A  There were no vitals taken for this visit.  Recommendations:  Based on my evaluation the patient does not appear to have an emergency medical condition.  Rozetta Nunnery, NP 10/30/2019, 4:30 AM

## 2019-10-30 NOTE — H&P (Addendum)
Psychiatric Admission Assessment Adult  Patient Identification: [name] [name] MRN:  [id number] Date of Evaluation:  10/30/2019 Chief Complaint:  " I am depressed" Principal Diagnosis: MDD, no psychotic features  Diagnosis:  Active Problems:   Severe recurrent major depression without psychotic features (HCC)  History of Present Illness: 64 year old male . Presented to [facility] voluntarily reporting worsening depression, suicidal ideations, which he describes as passive, without specific plan or intent.  Reports he has been feeling more depressed over the last several days to week. He attributes depression in part to family separation ( reports wife /children relocated to [place] several months ago, has been homeless, most recently had been staying at [employer]).Endorses worsening  neuro-vegetative symptoms as below.Denies psychotic symptoms.  He reports he has been off antidepressant medications x several weeks.    Associated Signs/Symptoms: Depression Symptoms:  depressed mood, anhedonia, insomnia, suicidal thoughts without plan, anxiety, loss of energy/fatigue, (Hypo) Manic Symptoms:  None noted or endorsed  Anxiety Symptoms: reports has been feeling anxious recently  Psychotic Symptoms:  Denies  PTSD Symptoms: Denies  Total Time spent with patient: 45 minutes  Past Psychiatric History: patient is known to [facility] from prior psychiatric admissions, most recently in July 2020. At the time was admitted was admitted twice ( within a few days of each other) for depression, suicidal ideations. Was diagnosed with MDD. A the time was discharged on Cymbalta, Neurontin, Ambien. Reports past history of suicide attempt earlier this year by overdosing . Denies history of psychosis. Denies history of mania or hypomania  Is the patient at risk to self? Yes.    Has the patient been a risk to self in the past 6 months? Yes.    Has the patient been a risk to self within the distant past? Yes.     Is the patient a risk to others? No.  Has the patient been a risk to others in the past 6 months? No.  Has the patient been a risk to others within the distant past? No.   Prior Inpatient Therapy: Prior Inpatient Therapy: Yes Prior Therapy Dates: July 2020 Prior Therapy Facilty/Provider(s): [facility] Reason for Treatment: MDD Prior Outpatient Therapy: reports his psychiatric medications had been prescribed by his PCP, Dr. [name] Alcohol Screening:   Substance Abuse History in the last 12 months:  Denies alcohol or drug abuse. Reports he is prescribed Norco ( opiate) but denies abusing or misusing and states he often uses less than prescribed  Consequences of Substance Abuse: Denies  Previous Psychotropic Medications: reports was not taking any psychiatric medications prior to admission. He had been prescribed Cymbalta earlier this year , but reports he did not think it was working . States he has been on several different antidepressants in the past, and remembers having taken Zoloft, Paxil, Prozac in the past . He remembers Zoloft as helpful and well tolerated.  Psychological Evaluations: No  Past Medical History: Hx of HTN, DM, CAD, reports MI in 2016. NKDA. Patient reports he has been compliant with his medications- Insulin, Lopressor, Lisonopril, Crestor, Neurontin. Of note , he also reports he takes Norco regularly. I have reviewed with pharmacist who has accessed [place] registry and has confirmed Norco 7.5 mgrs # 120 , last prescribed 10/11/2019 Past Medical History:  Diagnosis Date  . Barrett's esophagus   . CAD (coronary artery disease)    a. NSTEMI 05/2014 - occluded RCA dominant proximal s/p asp-thrombectomy/DES to RCA, minimal LAD/LCx, EF 50% by cath, 65-70% by echo.  Marland Kitchen  Depression   . Diabetes type 2, uncontrolled (HCC)   . Former tobacco use   . GERD (gastroesophageal reflux disease)   . Hemorrhoids    hx of  . Hypercholesterolemia   . Hypertension   . MI (myocardial infarction)  (HCC)   . Peripheral neuropathy     Past Surgical History:  Procedure Laterality Date  . LEFT HEART CATHETERIZATION WITH CORONARY ANGIOGRAM N/A 06/14/2014   Procedure: LEFT HEART CATHETERIZATION WITH CORONARY ANGIOGRAM;  Surgeon: Corky Crafts, MD;  Location: [facility] CATH LAB;  Service: Cardiovascular;  Laterality: N/A;  . TONSILLECTOMY     Family History: both parents deceased, had a brother who died from pharyngeal cancer . Family History  Problem Relation Age of Onset  . Hypertension Mother   . Alzheimer's disease Mother   . Alcohol abuse Father   . Cancer Father 93       "Throat"  . Diabetes type II Brother   . Cancer Brother 68       throat cancer  . Heart disease Neg Hx    Family Psychiatric  History: father was alcoholic, denies other history of mental illness in family. No suicides in family Tobacco Screening:  does not smoke  Social History:  Married/ currently separated, has 4 adult children, currently living alone . On disability . Social History   Substance and Sexual Activity  Alcohol Use No  . Alcohol/week: 1.0 standard drinks  . Types: 1 Standard drinks or equivalent per week     Social History   Substance and Sexual Activity  Drug Use No    Additional Social History: Marital status: Married    Pain Medications: Denies abuse Prescriptions: Denies abuse Over the Counter: Denies abuse History of alcohol / drug use?: No history of alcohol / drug abuse Longest period of sobriety (when/how long): NA  Allergies:  No Known Allergies Lab Results:  Results for orders placed or performed during the hospital encounter of 10/30/19 (from the past 48 hour(s))  Glucose, capillary     Status: Abnormal   Collection Time: 10/30/19  3:59 AM  Result Value Ref Range   Glucose-Capillary 304 (H) 70 - 99 mg/dL  Hemoglobin J3H     Status: Abnormal   Collection Time: 10/30/19  4:40 AM  Result Value Ref Range   Hgb A1c MFr Bld 9.4 (H) 4.8 - 5.6 %    Comment: (NOTE) Pre  diabetes:          5.7%-6.4% Diabetes:              >6.4% Glycemic control for   <7.0% adults with diabetes    Mean Plasma Glucose 223.08 mg/dL    Comment: Performed at [facility], [address], [place], [place] [address]  SARS Coronavirus 2 by RT PCR (hospital order, performed in [facility] hospital lab) Nasopharyngeal Nasopharyngeal Swab     Status: None   Collection Time: 10/30/19  4:49 AM   Specimen: Nasopharyngeal Swab  Result Value Ref Range   SARS Coronavirus 2 NEGATIVE NEGATIVE    Comment: (NOTE) SARS-CoV-2 target nucleic acids are NOT DETECTED. The SARS-CoV-2 RNA is generally detectable in upper and lower respiratory specimens during the acute phase of infection. The lowest concentration of SARS-CoV-2 viral copies this assay can detect is 250 copies / mL. A negative result does not preclude SARS-CoV-2 infection and should not be used as the sole basis for treatment or other patient management decisions.  A negative result may occur with  improper specimen collection / handling, submission of specimen other than nasopharyngeal swab, presence of viral mutation(s) within the areas targeted by this assay, and inadequate number of viral copies (<250 copies / mL). A negative result must be combined with clinical observations, patient history, and epidemiological information. Fact Sheet for Patients:   StrictlyIdeas.no Fact Sheet for Healthcare Providers: BankingDealers.co.za This test is not yet approved or cleared  by the [place] FDA and has been authorized for detection and/or diagnosis of SARS-CoV-2 by FDA under an Emergency Use Authorization (EUA).  This EUA will remain in effect (meaning this test can be used) for the duration of the COVID-19 declaration under Section 564(b)(1) of the Act, 21 U.S.C. section 360bbb-3(b)(1), unless the authorization is terminated or revoked sooner. Performed at [facility], [place] [address], [place], [place] [address]   CBC     Status: None   Collection Time: 10/30/19  6:30 AM  Result Value Ref Range   WBC 5.0 4.0 - 10.5 K/uL   RBC 4.96 4.22 - 5.81 MIL/uL   Hemoglobin 13.4 13.0 - 17.0 g/dL   HCT 43.1 39.0 - 52.0 %   MCV 86.9 80.0 - 100.0 fL   MCH 27.0 26.0 - 34.0 pg   MCHC 31.1 30.0 - 36.0 g/dL   RDW 13.6 11.5 - 15.5 %   Platelets 261 150 - 400 K/uL   nRBC 0.0 0.0 - 0.2 %    Comment: Performed at [facility], [place] [address], [place], [place] [address]  Comprehensive metabolic panel     Status: Abnormal   Collection Time: 10/30/19  6:30 AM  Result Value Ref Range   Sodium 138 135 - 145 mmol/L   Potassium 4.1 3.5 - 5.1 mmol/L   Chloride 106 98 - 111 mmol/L   CO2 21 (L) 22 - 32 mmol/L   Glucose, Bld 285 (H) 70 - 99 mg/dL   BUN 14 8 - 23 mg/dL   Creatinine, Ser 0.82 0.61 - 1.24 mg/dL   Calcium 9.0 8.9 - 10.3 mg/dL   Total Protein 7.2 6.5 - 8.1 g/dL   Albumin 4.3 3.5 - 5.0 g/dL   AST 21 15 - 41 U/L   ALT 13 0 - 44 U/L   Alkaline Phosphatase 101 38 - 126 U/L   Total Bilirubin 0.4 0.3 - 1.2 mg/dL   GFR calc non Af Amer >60 >60 mL/min   GFR calc Af Amer >60 >60 mL/min   Anion gap 11 5 - 15    Comment: Performed at [facility], [place] [address] [place], [place] [address]  TSH     Status: None   Collection Time: 10/30/19  6:30 AM  Result Value Ref Range   TSH 2.228 0.350 - 4.500 uIU/mL    Comment: Performed by a 3rd Generation assay with a functional sensitivity of <=0.01 uIU/mL. Performed at [facility], [place] [address], [place], [place] [address]   Glucose, capillary     Status: Abnormal   Collection Time: 10/30/19  6:34 AM  Result Value Ref Range   Glucose-Capillary 271 (H) 70 - 99 mg/dL  Glucose, capillary     Status: Abnormal   Collection Time: 10/30/19 12:59 PM  Result Value Ref Range   Glucose-Capillary 320 (H) 70 - 99 mg/dL    Blood Alcohol level:  Lab  Results  Component Value Date   ETH <10 06/21/2019   ETH <10 17/51/0258    Metabolic Disorder Labs:  Lab Results  Component Value Date   HGBA1C 9.4 (H) 10/30/2019   MPG 223.08 10/30/2019   MPG 231.69 06/25/2019   No results found for: PROLACTIN Lab Results  Component Value Date   CHOL 263 (H) 06/16/2019   TRIG 376 (H) 06/16/2019   HDL 37 (L) 06/16/2019   CHOLHDL 7.1 06/16/2019   VLDL 75 (H) 06/16/2019   LDLCALC 151 (H) 06/16/2019   LDLCALC 68 11/20/2018    Current Medications: Current Facility-Administered Medications  Medication Dose Route Frequency Provider Last Rate Last Dose  . acetaminophen (TYLENOL) tablet 650 mg  650 mg Oral Q6H PRN [name], NP      . alum & mag hydroxide-simeth (MAALOX/MYLANTA) 200-200-20 MG/5ML suspension 30 mL  30 mL Oral Q4H PRN [name], NP      . aspirin EC tablet 81 mg  81 mg Oral Daily [name], NP   81 mg at 10/30/19 0857  . gabapentin (NEURONTIN) capsule 200 mg  200 mg Oral BID [name], NP   200 mg at 10/30/19 0857  . hydrOXYzine (ATARAX/VISTARIL) tablet 25 mg  25 mg Oral TID PRN [name], NP      . insulin aspart (novoLOG) injection 0-9 Units  0-9 Units Subcutaneous TID WC [name], NP   7 Units at 10/30/19 1300  . insulin detemir (LEVEMIR) injection 40 Units  40 Units Subcutaneous BID [name], NP   40 Units at 10/30/19 0858  . lisinopril (ZESTRIL) tablet 20 mg  20 mg Oral Daily [name], NP   20 mg at 10/30/19 0858  . magnesium hydroxide (MILK OF MAGNESIA) suspension 30 mL  30 mL Oral Daily PRN [name], NP      . pantoprazole (PROTONIX) EC tablet 40 mg  40 mg Oral Daily [name], NP   40 mg at 10/30/19 0857  . rosuvastatin (CRESTOR) tablet 40 mg  40 mg Oral Daily [name], NP   40 mg at 10/30/19 1914   PTA Medications: Medications Prior to Admission  Medication Sig Dispense Refill Last Dose  . HYDROcodone-acetaminophen (NORCO) 7.5-325 MG tablet Take 1 tablet by mouth 4  (four) times daily as needed.     Marland Kitchen LEVEMIR FLEXTOUCH 100 UNIT/ML Pen Inject 40 Units into the skin 2 (two) times daily.     Marland Kitchen aspirin (EQ ASPIRIN ADULT LOW DOSE) 81 MG EC tablet Take 1 tablet (81 mg total) by mouth daily. Swallow whole: For heart health 30 tablet 12   . gabapentin (NEURONTIN) 300 MG capsule Take 1 capsule (300 mg total) by mouth 2 (two) times daily. For pain/neuropathy 60 capsule 0   . insulin aspart (NOVOLOG) 100 UNIT/ML injection Inject 0-15 Units into the skin 3 (three) times daily before meals. 121-150=2 units, 151-200=4 units, 201-250=7 units, 251-300=9 units, 301-350=12 units, >351=15 units     . lisinopril (ZESTRIL) 20 MG tablet Take 1 tablet (20 mg total) by mouth daily. For high blood pressure 10 tablet 0   . metoprolol tartrate (LOPRESSOR) 25 MG tablet Take 1 tablet (25 mg total) by mouth 2 (two) times daily. 60 tablet 0   . nitroGLYCERIN (NITROSTAT) 0.4 MG SL tablet Place 1 tablet (0.4 mg total) under the tongue every 5 (five) minutes as needed for chest pain (up to 3 doses). 25 tablet 0   . pantoprazole (PROTONIX) 40 MG tablet Take 1 tablet (40 mg total) by mouth daily. 90 tablet 0   .  rosuvastatin (CRESTOR) 40 MG tablet TAKE 1 TABLET(40 MG) BY MOUTH DAILY (Patient taking differently: Take 40 mg by mouth daily. ) 90 tablet 0     Musculoskeletal: Strength & Muscle Tone: within normal limits Gait & Station: normal Patient leans: N/A  Psychiatric Specialty Exam: Physical Exam  Review of Systems  Constitutional: Negative.  Negative for chills and fever.  HENT: Negative.   Eyes: Negative.   Respiratory: Negative for cough and shortness of breath.   Cardiovascular: Negative for chest pain.  Gastrointestinal: Negative.  Negative for diarrhea, nausea and vomiting.  Genitourinary: Negative.   Musculoskeletal: Negative.  Negative for myalgias and neck pain.  Skin: Negative.  Negative for rash.  Neurological: Negative for seizures and headaches.  Endo/Heme/Allergies:  Negative.   Psychiatric/Behavioral: Positive for depression and suicidal ideas. Negative for hallucinations.    Blood pressure 137/72, pulse 75, temperature 98.2 F (36.8 C), temperature source Oral, resp. rate 18, SpO2 97 %.There is no height or weight on file to calculate BMI.  General Appearance: Well Groomed  Eye Contact:  Fair  Speech:  Normal Rate  Volume:  Decreased  Mood:  Depressed  Affect:  Congruent and constricted   Thought Process:  Linear and Descriptions of Associations: Intact  Orientation:  Full (Time, Place, and Person), presents fully alert and oriented x 3   Thought Content:  denies hallucinations, no delusions expressed, does not appear internally preoccupied   Suicidal Thoughts:  No denies current suicidal or self injurious ideations, contracts for safety on unit, denies homicidal or violent ideations as well   Homicidal Thoughts:  No  Memory:  recent and remote grossly intact   Judgement:  Fair  Insight:  Fair  Psychomotor Activity:  Decreased- currently no restlessness or psychomotor agitation   Concentration:  Concentration: Good and Attention Span: Good  Recall:  Good  Fund of Knowledge:  Good  Language:  Good  Akathisia:  Negative  Handed:  Right  AIMS (if indicated):     Assets:  Desire for Improvement Resilience  ADL's:  Intact  Cognition:  WNL  Sleep:       Treatment Plan Summary: Daily contact with patient to assess and evaluate symptoms and progress in treatment, Medication management, Plan inpatient treatment and medications as below  Observation Level/Precautions:  15 minute checks  Laboratory:  as needed   Psychotherapy:  Milieu, group therapy   Medications:  Patient reports he remembers a prior Zoloft trial as helpful and well tolerated . Start Zoloft 50 mgrs QDAY Have verified long term opiate management with pharmacy and reviewed [place] registry. Patient reports usually takes BID Charlyne Petrin/less than prescribed . Currently presents comfortable and in  no acute distress or severe pain. For now will continue Norco 5 mgrs QDAY Q 12 hours  PRN for pain Have reviewed diabetic and antiypertensive management/ home meds  with hospitalist consultant . Will continue Insulin Levemir 40 units BID, Novolog 8 units TID for meal coverage, and Novolog sliding scale. Continue Lisinopril 20 mgrs QDAY and Metoprolol 25 mgrs BID  Consultations:  Have requested hospitalist ( Dr. Cresenciano GenreKamineni)consult to assist with antihypertensive and diabetic management. Have also consulted diabetic care coordinator consultant   Discharge Concerns:  -  Estimated LOS: 4-5 days   Other:     Physician Treatment Plan for Primary Diagnosis: MDD, no psychotic features  Long Term Goal(s): Improvement in symptoms so as ready for discharge  Short Term Goals: Ability to identify changes in lifestyle to reduce recurrence of condition will improve,  Ability to verbalize feelings will improve, Ability to disclose and discuss suicidal ideas, Ability to demonstrate self-control will improve, Ability to identify and develop effective coping behaviors will improve and Ability to maintain clinical measurements within normal limits will improve  Physician Treatment Plan for Secondary Diagnosis: Active Problems:   Severe recurrent major depression without psychotic features (HCC)  Long Term Goal(s): Improvement in symptoms so as ready for discharge  Short Term Goals: Ability to identify changes in lifestyle to reduce recurrence of condition will improve, Ability to verbalize feelings will improve, Ability to disclose and discuss suicidal ideas, Ability to demonstrate self-control will improve, Ability to identify and develop effective coping behaviors will improve and Ability to maintain clinical measurements within normal limits will improve  I certify that inpatient services furnished can reasonably be expected to improve the patient's condition.    Craige CottaFernando A , MD 11/30/20203:46 PM
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
## 2019-10-30 NOTE — Progress Notes (Signed)
Patient rated his day as a 5 out of a possible 10. He was unable to discuss his day, ie. "not sure". He did not share a goal with the group.

## 2019-10-30 NOTE — Plan of Care (Signed)
Nurse discussed anxiety, depression and coping skills with patient.  
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
## 2019-10-30 NOTE — BHH Suicide Risk Assessment (Signed)
[facility] Admission Suicide Risk Assessment   Nursing information obtained from:   Patient and chart Demographic factors:   63 year old male, separated, lives alone Current Mental Status:   See below Loss Factors:   Separation from family Historical Factors:   Depression, prior psychiatric admissions Risk Reduction Factors:   Resilience  Total Time spent with patient: 45 minutes Principal Problem: MDD, no psychotic features Diagnosis:  Active Problems:   Severe recurrent major depression without psychotic features ([place])  Subjective Data:   Continued Clinical Symptoms:    The "Alcohol Use Disorders Identification Test", Guidelines for Use in Primary Care, Second Edition.  World Pharmacologist [facility]). Score between 0-7:  no or low risk or alcohol related problems. Score between 8-15:  moderate risk of alcohol related problems. Score between 16-19:  high risk of alcohol related problems. Score 20 or above:  warrants further diagnostic evaluation for alcohol dependence and treatment.   CLINICAL FACTORS:  63 year old male, presented to [facility] voluntarily for worsening depression, passive SI, neurovegetative symptoms.  No psychotic symptoms.  Was not taking any psychiatric medications prior to admission. Reports separation from wife, relocated to [place], and homelessness have been contributing factors. History of prior psychiatric admissions for depression Medical history remarkable for DM,HTN, CAD.  Psychiatric Specialty Exam: Physical Exam  ROS  Blood pressure 137/72, pulse 75, temperature 98.2 F (36.8 C), temperature source Oral, resp. rate 18, SpO2 97 %.There is no height or weight on file to calculate BMI.  See admit note MSE    COGNITIVE FEATURES THAT CONTRIBUTE TO RISK:  Closed-mindedness and Loss of executive function    SUICIDE RISK:   Moderate:  Frequent suicidal ideation with limited intensity, and duration, some specificity in terms of plans, no associated intent, good  self-control, limited dysphoria/symptomatology, some risk factors present, and identifiable protective factors, including available and accessible social support.  PLAN OF CARE: Patient will be admitted to inpatient psychiatric unit for stabilization and safety. Will provide and encourage milieu participation. Provide medication management and maked adjustments as needed.  Will follow daily.    I certify that inpatient services furnished can reasonably be expected to improve the patient's condition.   Jenne Campus, MD 10/30/2019, 4:58 PM

## 2019-10-30 NOTE — Progress Notes (Signed)
Reviewed PTA medications with patient. Reports that he takes levemir 40 mg BID. Novolog with meals and SSI, not sure of the dosage. Lisinopril 20 mg daily, pantoprazole 40 mg daily, rosuvastatin 40 mg daily, gabapentin BID, and aspirin daily. Per pharmacy records patient is prescribed novolog 20 units TID with meals. Patient does not take medications consistently at home. At this time will not order routine dose of novolog.

## 2019-10-31 LAB — GLUCOSE, CAPILLARY
Glucose-Capillary: 96 mg/dL (ref 70–99)
Glucose-Capillary: 159 mg/dL — ABNORMAL HIGH (ref 70–99)
Glucose-Capillary: 124 mg/dL — ABNORMAL HIGH (ref 70–99)
Glucose-Capillary: 189 mg/dL — ABNORMAL HIGH (ref 70–99)

## 2019-10-31 LAB — RAPID URINE DRUG SCREEN, HOSP PERFORMED
Amphetamines: NOT DETECTED
Barbiturates: NOT DETECTED
Benzodiazepines: NOT DETECTED
Cocaine: NOT DETECTED
Opiates: POSITIVE — AB
Tetrahydrocannabinol: NOT DETECTED

## 2019-10-31 NOTE — Progress Notes (Signed)
Inpatient Diabetes Program Recommendations  AACE/ADA: New Consensus Statement on Inpatient Glycemic Control (2015)  Target Ranges:  Prepandial:   less than 140 mg/dL      Peak postprandial:   less than 180 mg/dL (1-2 hours)      Critically ill patients:  140 - 180 mg/dL   Lab Results  Component Value Date   GLUCAP 96 10/31/2019   HGBA1C 9.4 (H) 10/30/2019    Review of Glycemic Control  Diabetes history: DM2  Outpatient Diabetes medications: Levemir 40 units bid, Novolog 0-15 units tidwc Current orders for Inpatient glycemic control: Levemir 40 units bid, Novolog 0-9 units tidwc + 8 units tidwc  HgbA1C - 9.4% Familiar with pt from previous admissions Watch post-prandials - may need less meal coverage insulin.  Inpatient Diabetes Program Recommendations:     Consider decreasing Novolog to 4-6 units tidwc if blood sugars < 70 mg/dL.   Will follow daily for glucose trends.   Thank you. Lorenda Peck, RD, LDN, CDE Inpatient Diabetes Coordinator [phone]

## 2019-10-31 NOTE — Progress Notes (Signed)
   10/31/19 0209  Psych Admission Type (Psych Patients Only)  Admission Status Voluntary  Psychosocial Assessment  Patient Complaints Anxiety  Eye Contact Brief  Facial Expression Anxious;Sad  Affect Anxious;Sad;Depressed  Speech Logical/coherent  Interaction Other (Comment) (appropriate)  Motor Activity Other (Comment) (appropriate)  Appearance/Hygiene Unremarkable  Behavior Characteristics Cooperative  Mood Depressed  Thought Process  Coherency WDL  Content WDL  Delusions None reported or observed  Perception WDL  Hallucination None reported or observed  Judgment Poor  Confusion None  Danger to Self  Current suicidal ideation? Denies  Danger to Others  Danger to Others None reported or observed

## 2019-10-31 NOTE — Progress Notes (Signed)
Urine cup given to pt.

## 2019-10-31 NOTE — Progress Notes (Signed)
[place] NOVEL CORONAVIRUS (COVID-19) DAILY CHECK-OFF SYMPTOMS - answer yes or no to each - every day NO YES  Have you had a fever in the past 24 hours?  . Fever (Temp > 37.80C / 100F) X   Have you had any of these symptoms in the past 24 hours? . New Cough .  Sore Throat  .  Shortness of Breath .  Difficulty Breathing .  Unexplained Body Aches   X   Have you had any one of these symptoms in the past 24 hours not related to allergies?   . Runny Nose .  Nasal Congestion .  Sneezing   X   If you have had runny nose, nasal congestion, sneezing in the past 24 hours, has it worsened?  X   EXPOSURES - check yes or no X   Have you traveled outside the state in the past 14 days?  X   Have you been in contact with someone with a confirmed diagnosis of COVID-19 or PUI in the past 14 days without wearing appropriate PPE?  X   Have you been living in the same home as a person with confirmed diagnosis of COVID-19 or a PUI (household contact)?    X   Have you been diagnosed with COVID-19?    X              What to do next: Answered NO to all: Answered YES to anything:   Proceed with unit schedule Follow the [facility] Inpatient Flowsheet.   

## 2019-10-31 NOTE — Progress Notes (Signed)
[facility] MD Progress Note  10/31/2019 1:36 PM [name] [name]  MRN:  [id number] Subjective: Patient is a 63 year old male with a known past psychiatric history significant for major depression who presented with suicidal ideation and worsening depression on 10/30/2019 and was admitted to the hospital.  Objective: Patient is seen and examined.  Patient is a 63 year old male known to me from a previous psychiatric admission.  He is seen in follow-up.  He stated that he went back to [place] for Thanksgiving, and then he returned to the [employer], but they were closed secondary to a coronavirus outbreak at their facility.  He ran out of his psychiatric medications as well as his diabetic medications, and ended up back in the emergency room.  He stated he is really essentially unchanged.  He is only been back on his medications for 1 day.  He admitted to being depressed, and having some mild suicidal ideation, but his hope is that by the time his medicines kick and that he will be able to return to the [facility] rescue [facility].  He is mildly tachycardic at a rate of 101, his blood pressure stable.  He slept 6.75 hours last night.  His laboratories on admission showed a glucose of 285, and his blood sugar this morning is 159.  The rest of his electrolytes were normal.  His CBC was normal.  His hemoglobin A1c is 9.4.  Principal Problem: <principal problem not specified> Diagnosis: Active Problems:   Severe recurrent major depression without psychotic features (HCC)  Total Time spent with patient: 20 minutes  Past Psychiatric History: See admission H&P  Past Medical History:  Past Medical History:  Diagnosis Date  . Anxiety   . Barrett's esophagus   . CAD (coronary artery disease)    a. NSTEMI 05/2014 - occluded RCA dominant proximal s/p asp-thrombectomy/DES to RCA, minimal LAD/LCx, EF 50% by cath, 65-70% by echo.  . Depression   . Diabetes type 2, uncontrolled (HCC)   . Former tobacco use   . GERD  (gastroesophageal reflux disease)   . Hemorrhoids    hx of  . Hypercholesterolemia   . Hypertension   . MI (myocardial infarction) (HCC)   . Peripheral neuropathy     Past Surgical History:  Procedure Laterality Date  . LEFT HEART CATHETERIZATION WITH CORONARY ANGIOGRAM N/A 06/14/2014   Procedure: LEFT HEART CATHETERIZATION WITH CORONARY ANGIOGRAM;  Surgeon: Corky Crafts, MD;  Location: [facility] CATH LAB;  Service: Cardiovascular;  Laterality: N/A;  . TONSILLECTOMY     Family History:  Family History  Problem Relation Age of Onset  . Hypertension Mother   . Alzheimer's disease Mother   . Alcohol abuse Father   . Cancer Father 37       "Throat"  . Diabetes type II Brother   . Cancer Brother 68       throat cancer  . Heart disease Neg Hx    Family Psychiatric  History: See admission H&P Social History:  Social History   Substance and Sexual Activity  Alcohol Use No  . Alcohol/week: 1.0 standard drinks  . Types: 1 Standard drinks or equivalent per week     Social History   Substance and Sexual Activity  Drug Use No    Social History   Socioeconomic History  . Marital status: Single    Spouse name: Not on file  . Number of children: Not on file  . Years of education: Not on file  . Highest education level:  Not on file  Occupational History  . Occupation: unemployed    Comment: applying for disability  Social Needs  . Financial resource strain: Not on file  . Food insecurity    Worry: Not on file    Inability: Not on file  . Transportation needs    Medical: Not on file    Non-medical: Not on file  Tobacco Use  . Smoking status: Former Smoker    Packs/day: 0.25    Years: 3.00    Pack years: 0.75    Quit date: 11/30/1998    Years since quitting: 20.9  . Smokeless tobacco: Never Used  Substance and Sexual Activity  . Alcohol use: No    Alcohol/week: 1.0 standard drinks    Types: 1 Standard drinks or equivalent per week  . Drug use: No  . Sexual activity:  Yes    Birth control/protection: None  Lifestyle  . Physical activity    Days per week: Not on file    Minutes per session: Not on file  . Stress: Not on file  Relationships  . Social Musician on phone: Not on file    Gets together: Not on file    Attends religious service: Not on file    Active member of club or organization: Not on file    Attends meetings of clubs or organizations: Not on file    Relationship status: Not on file  Other Topics Concern  . Not on file  Social History Narrative  . Not on file   Additional Social History:    Pain Medications: see MAR Prescriptions: see MAR Over the Counter: see MAR History of alcohol / drug use?: No history of alcohol / drug abuse Longest period of sobriety (when/how long): unknown Negative Consequences of Use: Financial, Personal relationships, Work / School Withdrawal Symptoms: Other (Comment)(denied withdrawals)                    Sleep: Fair  Appetite:  Fair  Current Medications: Current Facility-Administered Medications  Medication Dose Route Frequency Provider Last Rate Last Dose  . alum & mag hydroxide-simeth (MAALOX/MYLANTA) 200-200-20 MG/5ML suspension 30 mL  30 mL Oral Q4H PRN [name], NP      . aspirin EC tablet 81 mg  81 mg Oral Daily [name], NP   81 mg at 10/31/19 0811  . gabapentin (NEURONTIN) capsule 100 mg  100 mg Oral BID Cobos, [name], MD   100 mg at 10/31/19 0811  . HYDROcodone-acetaminophen (NORCO/VICODIN) 5-325 MG per tablet 1 tablet  1 tablet Oral Q12H PRN Cobos, [name], MD   1 tablet at 10/31/19 2595  . hydrOXYzine (ATARAX/VISTARIL) tablet 25 mg  25 mg Oral TID PRN [name], NP      . insulin aspart (novoLOG) injection 0-9 Units  0-9 Units Subcutaneous TID [name], NP   2 Units at 10/31/19 1207  . insulin aspart (novoLOG) injection 8 Units  8 Units Subcutaneous TID WC Alessandra Bevels, MD   8 Units at 10/31/19 1206  . insulin detemir  (LEVEMIR) injection 40 Units  40 Units Subcutaneous BID [name], NP   40 Units at 10/31/19 0815  . lisinopril (ZESTRIL) tablet 20 mg  20 mg Oral Daily [name], NP   20 mg at 10/31/19 0811  . magnesium hydroxide (MILK OF MAGNESIA) suspension 30 mL  30 mL Oral Daily PRN Jackelyn Poling, NP      .  metoprolol tartrate (LOPRESSOR) tablet 25 mg  25 mg Oral BID Cobos, Rockey SituFernando A, MD   25 mg at 10/31/19 0811  . pantoprazole (PROTONIX) EC tablet 40 mg  40 mg Oral Daily Nira ConnBerry, Jason A, NP   40 mg at 10/31/19 0811  . rosuvastatin (CRESTOR) tablet 40 mg  40 mg Oral Daily Nira ConnBerry, Jason A, NP   40 mg at 10/31/19 0811  . sertraline (ZOLOFT) tablet 50 mg  50 mg Oral Daily Cobos, Rockey SituFernando A, MD   50 mg at 10/31/19 [id number]    Lab Results:  Results for orders placed or performed during the hospital encounter of 10/30/19 (from the past 48 hour(s))  Glucose, capillary     Status: Abnormal   Collection Time: 10/30/19  3:59 AM  Result Value Ref Range   Glucose-Capillary 304 (H) 70 - 99 mg/dL  Hemoglobin [id number]     Status: Abnormal   Collection Time: 10/30/19  4:40 AM  Result Value Ref Range   Hgb A1c MFr Bld 9.4 (H) 4.8 - 5.6 %    Comment: (NOTE) Pre diabetes:          5.7%-6.4% Diabetes:              >6.4% Glycemic control for   <7.0% adults with diabetes    Mean Plasma Glucose 223.08 mg/dL    Comment: Performed at [facility], [address] [place], [place] [address]  SARS Coronavirus 2 by RT PCR (hospital order, performed in [facility] hospital lab) Nasopharyngeal Nasopharyngeal Swab     Status: None   Collection Time: 10/30/19  4:49 AM   Specimen: Nasopharyngeal Swab  Result Value Ref Range   SARS Coronavirus 2 NEGATIVE NEGATIVE    Comment: (NOTE) SARS-CoV-2 target nucleic acids are NOT DETECTED. The SARS-CoV-2 RNA is generally detectable in upper and lower respiratory specimens during the acute phase of infection. The lowest concentration of SARS-CoV-2 viral copies this assay can  detect is 250 copies / mL. A negative result does not preclude SARS-CoV-2 infection and should not be used as the sole basis for treatment or other patient management decisions.  A negative result may occur with improper specimen collection / handling, submission of specimen other than nasopharyngeal swab, presence of viral mutation(s) within the areas targeted by this assay, and inadequate number of viral copies (<250 copies / mL). A negative result must be combined with clinical observations, patient history, and epidemiological information. Fact Sheet for Patients:   BoilerBrush.com.cyhttps://www.fda.gov/media/136312/download Fact Sheet for Healthcare Providers: https://pope.com/https://www.fda.gov/media/136313/download This test is not yet approved or cleared  by the [place] FDA and has been authorized for detection and/or diagnosis of SARS-CoV-2 by FDA under an Emergency Use Authorization (EUA).  This EUA will remain in effect (meaning this test can be used) for the duration of the COVID-19 declaration under Section 564(b)(1) of the Act, 21 U.S.C. section 360bbb-3(b)(1), unless the authorization is terminated or revoked sooner. Performed at [facility], [address] [place], [place] [address]   CBC     Status: None   Collection Time: 10/30/19  6:30 AM  Result Value Ref Range   WBC 5.0 4.0 - 10.5 K/uL   RBC 4.96 4.22 - 5.81 MIL/uL   Hemoglobin 13.4 13.0 - 17.0 g/dL   HCT 69.643.1 29.539.0 - 28.452.0 %   MCV 86.9 80.0 - 100.0 fL   MCH 27.0 26.0 - 34.0 pg   MCHC 31.1 30.0 - 36.0 g/dL   RDW 13.213.6 44.011.5 -  15.5 %   Platelets 261 150 - 400 K/uL   nRBC 0.0 0.0 - 0.2 %    Comment: Performed at [facility], [address] [place], [place] [address]  Comprehensive metabolic panel     Status: Abnormal   Collection Time: 10/30/19  6:30 AM  Result Value Ref Range   Sodium 138 135 - 145 mmol/L   Potassium 4.1 3.5 - 5.1 mmol/L   Chloride 106 98 - 111 mmol/L   CO2 21 (L) 22 - 32  mmol/L   Glucose, Bld 285 (H) 70 - 99 mg/dL   BUN 14 8 - 23 mg/dL   Creatinine, Ser 6.040.82 0.61 - 1.24 mg/dL   Calcium 9.0 8.9 - 54.010.3 mg/dL   Total Protein 7.2 6.5 - 8.1 g/dL   Albumin 4.3 3.5 - 5.0 g/dL   AST 21 15 - 41 U/L   ALT 13 0 - 44 U/L   Alkaline Phosphatase 101 38 - 126 U/L   Total Bilirubin 0.4 0.3 - 1.2 mg/dL   GFR calc non Af Amer >60 >60 mL/min   GFR calc Af Amer >60 >60 mL/min   Anion gap 11 5 - 15    Comment: Performed at [facility], [address] [place], [place] [address]  TSH     Status: None   Collection Time: 10/30/19  6:30 AM  Result Value Ref Range   TSH 2.228 0.350 - 4.500 uIU/mL    Comment: Performed by a 3rd Generation assay with a functional sensitivity of <=0.01 uIU/mL. Performed at [facility], [address] [place], [place] [address]   Glucose, capillary     Status: Abnormal   Collection Time: 10/30/19  6:34 AM  Result Value Ref Range   Glucose-Capillary 271 (H) 70 - 99 mg/dL  Glucose, capillary     Status: Abnormal   Collection Time: 10/30/19 12:59 PM  Result Value Ref Range   Glucose-Capillary 320 (H) 70 - 99 mg/dL  Glucose, capillary     Status: Abnormal   Collection Time: 10/30/19  5:24 PM  Result Value Ref Range   Glucose-Capillary 311 (H) 70 - 99 mg/dL  Glucose, capillary     Status: Abnormal   Collection Time: 10/30/19  8:44 PM  Result Value Ref Range   Glucose-Capillary 229 (H) 70 - 99 mg/dL  Glucose, capillary     Status: None   Collection Time: 10/31/19  5:52 AM  Result Value Ref Range   Glucose-Capillary 96 70 - 99 mg/dL   Comment 1 Notify RN    Comment 2 Document in Chart   Glucose, capillary     Status: Abnormal   Collection Time: 10/31/19 11:55 AM  Result Value Ref Range   Glucose-Capillary 159 (H) 70 - 99 mg/dL   Comment 1 Notify RN    Comment 2 Document in Chart     Blood Alcohol level:  Lab Results  Component Value Date   ETH <10 06/21/2019   ETH <10 06/16/2019     Metabolic Disorder Labs: Lab Results  Component Value Date   HGBA1C 9.4 (H) 10/30/2019   MPG 223.08 10/30/2019   MPG 231.69 06/25/2019   No results found for: PROLACTIN Lab Results  Component Value Date   CHOL 263 (H) 06/16/2019   TRIG 376 (H) 06/16/2019   HDL 37 (L) 06/16/2019   CHOLHDL 7.1 06/16/2019   VLDL 75 (H) 06/16/2019   LDLCALC 151 (H) 06/16/2019   LDLCALC 68 11/20/2018  Physical Findings: AIMS: Facial and Oral Movements Muscles of Facial Expression: None, normal Lips and Perioral Area: None, normal Jaw: None, normal Tongue: None, normal,Extremity Movements Upper (arms, wrists, hands, fingers): None, normal Lower (legs, knees, ankles, toes): None, normal, Trunk Movements Neck, shoulders, hips: None, normal, Overall Severity Severity of abnormal movements (highest score from questions above): None, normal Incapacitation due to abnormal movements: None, normal Patient's awareness of abnormal movements (rate only patient's report): No Awareness, Dental Status Current problems with teeth and/or dentures?: No Does patient usually wear dentures?: No  CIWA:  CIWA-Ar Total: 1 COWS:  COWS Total Score: 1  Musculoskeletal: Strength & Muscle Tone: within normal limits Gait & Station: normal Patient leans: N/A  Psychiatric Specialty Exam: Physical Exam  Nursing note and vitals reviewed. Constitutional: He is oriented to person, place, and time. He appears well-developed and well-nourished.  HENT:  Head: Normocephalic and atraumatic.  Respiratory: Effort normal.  Neurological: He is alert and oriented to person, place, and time.    ROS  Blood pressure 114/75, pulse (!) 101, temperature 98.4 F (36.9 C), temperature source Oral, resp. rate 15, height 5\' 9"  (1.753 m), weight 99.8 kg, SpO2 100 %.Body mass index is 32.49 kg/m.  General Appearance: Casual  Eye Contact:  Fair  Speech:  Normal Rate  Volume:  Normal  Mood:  Depressed  Affect:  Congruent  Thought  Process:  Coherent and Descriptions of Associations: Intact  Orientation:  Full (Time, Place, and Person)  Thought Content:  Logical  Suicidal Thoughts:  Yes.  without intent/plan  Homicidal Thoughts:  No  Memory:  Immediate;   Fair Recent;   Fair Remote;   Fair  Judgement:  Intact  Insight:  Lacking  Psychomotor Activity:  Increased  Concentration:  Concentration: Fair and Attention Span: Fair  Recall:  [employer] of Knowledge:  Fair  Language:  Good  Akathisia:  Negative  Handed:  Right  AIMS (if indicated):     Assets:  Desire for Improvement Resilience  ADL's:  Intact  Cognition:  WNL  Sleep:  Number of Hours: 6.75     Treatment Plan Summary: Daily contact with patient to assess and evaluate symptoms and progress in treatment, Medication management and Plan : Patient is seen and examined.  Patient is a 63 year old male with the above-stated past psychiatric history who is seen in follow-up.   Diagnosis: #1 major depression, recurrent, severe without psychotic features, #2 poorly controlled diabetes mellitus, #3 essential hypertension.  Patient is seen in follow-up.  He is essentially unchanged from admission.  He continues on sliding scale insulin, gabapentin, hydrocodone, insulin, lisinopril, metoprolol, Protonix and sertraline.  No change in these medications currently. 1.  Continue coated aspirin 81 mg p.o. daily for heart health. 2.  Continue gabapentin 100 mg p.o. twice daily for chronic pain and anxiety. 3.  Continue hydroxyzine 25 mg p.o. 3 times daily as needed anxiety. 4.  Continue NovoLog insulin for sliding scale 3 times daily before meals and at bedtime. 5.  Continue NovoLog 8 units subcu 3 times daily AC for diabetes mellitus. 6.  Continue Levemir 40 units subcu twice daily for diabetes mellitus. 7.  Continue metoprolol 25 mg p.o. twice daily for tachycardia and hypertension. 8.  Continue Protonix 40 mg p.o. daily for gastric protection. 9.  Continue Lipitor  40 mg p.o. daily for hyperlipidemia. 10.  Continue sertraline 50 mg p.o. daily for depression and anxiety. 11.  Disposition planning-in progress.  Sharma Covert, MD 10/31/2019, 1:36  PM

## 2019-10-31 NOTE — Progress Notes (Signed)
UA placed in lab refrigerator for pick up.  

## 2019-10-31 NOTE — Progress Notes (Signed)
   10/31/19 0800  Psych Admission Type (Psych Patients Only)  Admission Status Voluntary  Psychosocial Assessment  Patient Complaints None  Eye Contact Brief  Facial Expression Flat  Affect Depressed  Speech Logical/coherent  Interaction Other (Comment) (appropriate to situation)  Motor Activity Other (Comment) (WDL)  Appearance/Hygiene Unremarkable  Behavior Characteristics Cooperative  Mood Depressed  Thought Process  Coherency WDL  Content WDL  Delusions None reported or observed  Perception WDL  Hallucination None reported or observed  Judgment WDL  Confusion WDL  Danger to Self  Current suicidal ideation? Denies  Danger to Others  Danger to Others None reported or observed
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
## 2019-11-01 LAB — GLUCOSE, CAPILLARY
Glucose-Capillary: 181 mg/dL — ABNORMAL HIGH (ref 70–99)
Glucose-Capillary: 190 mg/dL — ABNORMAL HIGH (ref 70–99)
Glucose-Capillary: 282 mg/dL — ABNORMAL HIGH (ref 70–99)
Glucose-Capillary: 362 mg/dL — ABNORMAL HIGH (ref 70–99)

## 2019-11-01 MED ORDER — GLUCERNA SHAKE PO LIQD
237.0000 mL | Freq: Two times a day (BID) | ORAL | Status: DC
  Administered 2019-11-01 – 2019-11-03 (×3): 237 mL via ORAL

## 2019-11-01 MED ORDER — INSULIN DETEMIR 100 UNIT/ML ~~LOC~~ SOLN
45.0000 [IU] | Freq: Every day | SUBCUTANEOUS | Status: DC
  Administered 2019-11-01: 45 [IU] via SUBCUTANEOUS

## 2019-11-01 MED ORDER — IBUPROFEN 400 MG PO TABS: 400.0000 mg | ORAL_TABLET | Freq: Four times a day (QID) | ORAL | Status: DC | PRN

## 2019-11-01 MED ORDER — IBUPROFEN 600 MG PO TABS
600.0000 mg | ORAL_TABLET | Freq: Four times a day (QID) | ORAL | Status: DC | PRN
  Administered 2019-11-01 – 2019-11-05 (×5): 600 mg via ORAL
  Filled 2019-11-01 (×5): qty 1

## 2019-11-01 MED ORDER — INSULIN DETEMIR 100 UNIT/ML ~~LOC~~ SOLN
40.0000 [IU] | Freq: Every day | SUBCUTANEOUS | Status: DC
  Administered 2019-11-02: 40 [IU] via SUBCUTANEOUS

## 2019-11-01 MED ORDER — GABAPENTIN 300 MG PO CAPS
300.0000 mg | ORAL_CAPSULE | Freq: Two times a day (BID) | ORAL | Status: DC
  Administered 2019-11-01 – 2019-11-06 (×10): 300 mg via ORAL
  Filled 2019-11-01 (×14): qty 1

## 2019-11-01 MED ORDER — METOPROLOL TARTRATE 50 MG PO TABS
50.0000 mg | ORAL_TABLET | Freq: Two times a day (BID) | ORAL | Status: DC
  Administered 2019-11-01 – 2019-11-06 (×10): 50 mg via ORAL
  Filled 2019-11-01 (×14): qty 1

## 2019-11-01 NOTE — Progress Notes (Signed)
Patient ID: [name] [name], male   DOB: 03/29/1956, 63 y.o.   MRN: [id number]   [place] NOVEL CORONAVIRUS (COVID-19) DAILY CHECK-OFF SYMPTOMS - answer yes or no to each - every day NO YES  Have you had a fever in the past 24 hours?  . Fever (Temp > 37.80C / 100F) X   Have you had any of these symptoms in the past 24 hours? . New Cough .  Sore Throat  .  Shortness of Breath .  Difficulty Breathing .  Unexplained Body Aches   X   Have you had any one of these symptoms in the past 24 hours not related to allergies?   . Runny Nose .  Nasal Congestion .  Sneezing   X   If you have had runny nose, nasal congestion, sneezing in the past 24 hours, has it worsened?  X   EXPOSURES - check yes or no X   Have you traveled outside the state in the past 14 days?  X   Have you been in contact with someone with a confirmed diagnosis of COVID-19 or PUI in the past 14 days without wearing appropriate PPE?  X   Have you been living in the same home as a person with confirmed diagnosis of COVID-19 or a PUI (household contact)?    X   Have you been diagnosed with COVID-19?    X              What to do next: Answered NO to all: Answered YES to anything:   Proceed with unit schedule Follow the [facility] Inpatient Flowsheet.   

## 2019-11-01 NOTE — Progress Notes (Signed)
   11/01/19 0105  Psych Admission Type (Psych Patients Only)  Admission Status Voluntary  Psychosocial Assessment  Patient Complaints Anxiety  Eye Contact Brief  Facial Expression Flat  Affect Depressed  Speech Logical/coherent  Interaction Other (Comment) (appropriate to situation)  Motor Activity Other (Comment) (WDL)  Appearance/Hygiene Unremarkable  Behavior Characteristics Calm;Cooperative  Mood Pleasant  Thought Process  Coherency WDL  Content WDL  Delusions None reported or observed  Perception WDL  Hallucination None reported or observed  Judgment WDL  Confusion WDL  Danger to Self  Current suicidal ideation? Denies  Danger to Others  Danger to Others None reported or observed

## 2019-11-01 NOTE — [facility] Suicide Risk Assessment (Signed)
[facility] INPATIENT:  Family/Significant Other Suicide Prevention Education  Suicide Prevention Education:  Contact Attempts: Garlan Fillers, spouse, [phone], has been identified by the patient as the family member/significant other with whom the patient will be residing, and identified as the person(s) who will aid the patient in the event of a mental health crisis.  With written consent from the patient, two attempts were made to provide suicide prevention education, prior to and/or following the patient's discharge.  We were unsuccessful in providing suicide prevention education.  A suicide education pamphlet was given to the patient to share with family/significant other.  Date and time of first attempt: 11/01/2019 2:00PM Date and time of second attempt: Second attempt is needed  CSW notes that HIPAA compliant voicemail was left.  Rozann Lesches 11/01/2019, 2:01 PM

## 2019-11-01 NOTE — Progress Notes (Signed)
[facility]  11/01/2019 12:57 PM [name] DamesKevin J [name]  MRN:  [id number] Subjective:  Patient is a 63 year old male with a known past psychiatric history significant for major depression who presented with suicidal ideation and worsening depression on 10/30/2019 and was admitted to the hospital.  Objective: Patient is seen and examined.  Patient is a 63 year old male with a past psychiatric history significant for major depression who is seen in follow-up.  He is essentially unchanged from yesterday.  He does complain of a headache today, and he thinks it is due to his medications.  We discussed the fact that his hemoglobin A1c was 9.4 on admission, and most likely the headache is related to his diabetic control.  His blood sugar this morning was 190, and we discussed the fact that I am increasing his nighttime Lantus to 45 units, and that we will continue the sliding scale in the daytime Lantus at 40 units.  He also stated that his gabapentin dose on discharge was 300 mg p.o. twice daily, and I told him we would increase that.  I also added ibuprofen for headaches to stay away from the narcotic pain medication.  We discussed his housing situation, and he let me know that his daughter lives in student housing at college, and he is not really allowed to go stay there.  He has attempted to get back in touch with the rescue mission, but apparently they are still closed.  He admitted to helplessness, hopelessness and worthlessness but denied suicidal ideation.  His blood pressure this morning was 142/81, pulse was 89.  He is afebrile at 97.9.  He did sleep 6.5 hours last night.  Principal Problem: <principal problem not specified> Diagnosis: Active Problems:   Severe recurrent major depression without psychotic features (HCC)  Total Time spent with patient: 20 minutes  Past Psychiatric History: See admission H&P  Past Medical History:  Past Medical History:  Diagnosis Date  . Anxiety   . Barrett's  esophagus   . CAD (coronary artery disease)    a. NSTEMI 05/2014 - occluded RCA dominant proximal s/p asp-thrombectomy/DES to RCA, minimal LAD/LCx, EF 50% by cath, 65-70% by echo.  . Depression   . Diabetes type 2, uncontrolled (HCC)   . Former tobacco use   . GERD (gastroesophageal reflux disease)   . Hemorrhoids    hx of  . Hypercholesterolemia   . Hypertension   . MI (myocardial infarction) (HCC)   . Peripheral neuropathy     Past Surgical History:  Procedure Laterality Date  . LEFT HEART CATHETERIZATION WITH CORONARY ANGIOGRAM N/A 06/14/2014   Procedure: LEFT HEART CATHETERIZATION WITH CORONARY ANGIOGRAM;  Surgeon: Corky CraftsJayadeep S Varanasi, MD;  [facility];  Service: Cardiovascular;  Laterality: N/A;  . TONSILLECTOMY     Family History:  Family History  Problem Relation Age of Onset  . Hypertension Mother   . Alzheimer's disease Mother   . Alcohol abuse Father   . Cancer Father [id number]       "Throat"  . Diabetes type II Brother   . Cancer Brother 68       throat cancer  . Heart disease Neg Hx    Family Psychiatric  History: See admission H&P Social History:  Social History   Substance and Sexual Activity  Alcohol Use No  . Alcohol/week: 1.0 standard drinks  . Types: 1 Standard drinks or equivalent per week     Social History   Substance and Sexual Activity  Drug Use  No    Social History   Socioeconomic History  . Marital status: Single    Spouse name: Not on file  . Number of children: Not on file  . Years of education: Not on file  . Highest education level: Not on file  Occupational History  . Occupation: unemployed    Comment: applying for disability  Social Needs  . Financial resource strain: Not on file  . Food insecurity    Worry: Not on file    Inability: Not on file  . Transportation needs    Medical: Not on file    Non-medical: Not on file  Tobacco Use  . Smoking status: Former Smoker    Packs/day: 0.25    Years: 3.00    Pack years:  0.75    Quit date: 11/30/1998    Years since quitting: 20.9  . Smokeless tobacco: Never Used  Substance and Sexual Activity  . Alcohol use: No    Alcohol/week: 1.0 standard drinks    Types: 1 Standard drinks or equivalent per week  . Drug use: No  . Sexual activity: Yes    Birth control/protection: None  Lifestyle  . Physical activity    Days per week: Not on file    Minutes per session: Not on file  . Stress: Not on file  Relationships  . Social Herbalist on phone: Not on file    Gets together: Not on file    Attends religious service: Not on file    Active member of club or organization: Not on file    Attends meetings of clubs or organizations: Not on file    Relationship status: Not on file  Other Topics Concern  . Not on file  Social History Narrative  . Not on file   Additional Social History:    Pain Medications: see MAR Prescriptions: see MAR Over the Counter: see MAR History of alcohol / drug use?: No history of alcohol / drug abuse Longest period of sobriety (when/how long): unknown Negative Consequences of Use: Financial, Personal relationships, Work / School Withdrawal Symptoms: Other (Comment)(denied withdrawals)                    Sleep: Good  Appetite:  Good  Current Medications: Current Facility-Administered Medications  Medication Dose Route Frequency Provider Last Rate Last Dose  . alum & mag hydroxide-simeth (MAALOX/MYLANTA) 200-200-20 MG/5ML suspension 30 mL  30 mL Oral Q4H PRN Lindon Romp A, NP      . aspirin EC tablet 81 mg  81 mg Oral Daily Lindon Romp A, NP   81 mg at 11/01/19 0824  . feeding supplement (GLUCERNA SHAKE) (GLUCERNA SHAKE) liquid 237 mL  237 mL Oral BID BM Hampton Abbot, MD   237 mL at 11/01/19 1246  . gabapentin (NEURONTIN) capsule 300 mg  300 mg Oral BID Sharma Covert, MD      . HYDROcodone-acetaminophen (NORCO/VICODIN) 5-325 MG per tablet 1 tablet  1 tablet Oral Q12H PRN Cobos, Myer Peer, MD   1  tablet at 11/01/19 0630  . hydrOXYzine (ATARAX/VISTARIL) tablet 25 mg  25 mg Oral TID PRN Lindon Romp A, NP      . ibuprofen (ADVIL) tablet 600 mg  600 mg Oral Q6H PRN Sharma Covert, MD      . insulin aspart (novoLOG) injection 0-9 Units  0-9 Units Subcutaneous TID WC Rozetta Nunnery, NP   2 Units at 11/01/19 1151  . insulin aspart (  novoLOG) injection 8 Units  8 Units Subcutaneous TID WC Kamineni, Neelima, MD   8 Units at 11/01/19 1152  . [START ON 11/02/2019] insulin detemirAlessandra Bevels (LEVEMIR) injection 40 Units  40 Units Subcutaneous Daily Antonieta Pertlary, Greg Lawson, MD      . insulin detemir (LEVEMIR) injection 45 Units  45 Units Subcutaneous QHS Antonieta Pertlary, Greg Lawson, MD      . lisinopril (ZESTRIL) tablet 20 mg  20 mg Oral Daily Nira ConnBerry, Jason A, NP   20 mg at 11/01/19 0824  . magnesium hydroxide (MILK OF MAGNESIA) suspension 30 mL  30 mL Oral Daily PRN Nira ConnBerry, Jason A, NP      . metoprolol tartrate (LOPRESSOR) tablet 25 mg  25 mg Oral BID Cobos, Rockey SituFernando A, MD   25 mg at 11/01/19 0824  . pantoprazole (PROTONIX) EC tablet 40 mg  40 mg Oral Daily Nira ConnBerry, Jason A, NP   40 mg at 11/01/19 0824  . rosuvastatin (CRESTOR) tablet 40 mg  40 mg Oral Daily Nira ConnBerry, Jason A, NP   40 mg at 11/01/19 0824  . sertraline (ZOLOFT) tablet 50 mg  50 mg Oral Daily Cobos, Rockey SituFernando A, MD   50 mg at 11/01/19 16100824    Lab Results:  Results for orders placed or performed during the hospital encounter of 10/30/19 (from the past 48 hour(s))  Glucose, capillary     Status: Abnormal   Collection Time: 10/30/19 12:59 PM  Result Value Ref Range   Glucose-Capillary 320 (H) 70 - 99 mg/dL  Glucose, capillary     Status: Abnormal   Collection Time: 10/30/19  5:24 PM  Result Value Ref Range   Glucose-Capillary 311 (H) 70 - 99 mg/dL  Glucose, capillary     Status: Abnormal   Collection Time: 10/30/19  8:44 PM  Result Value Ref Range   Glucose-Capillary 229 (H) 70 - 99 mg/dL  Glucose, capillary     Status: None   Collection Time: 10/31/19   5:52 AM  Result Value Ref Range   Glucose-Capillary 96 70 - 99 mg/dL   Comment 1 Notify RN    Comment 2 Document in Chart   Glucose, capillary     Status: Abnormal   Collection Time: 10/31/19 11:55 AM  Result Value Ref Range   Glucose-Capillary 159 (H) 70 - 99 mg/dL   Comment 1 Notify RN    Comment 2 Document in Chart   Glucose, capillary     Status: Abnormal   Collection Time: 10/31/19  5:00 PM  Result Value Ref Range   Glucose-Capillary 124 (H) 70 - 99 mg/dL   Comment 1 Notify RN    Comment 2 Document in Chart   Rapid urine drug screen (hospital performed)     Status: Abnormal   Collection Time: 10/31/19  5:48 PM  Result Value Ref Range   Opiates POSITIVE (A) NONE DETECTED   Cocaine NONE DETECTED NONE DETECTED   Benzodiazepines NONE DETECTED NONE DETECTED   Amphetamines NONE DETECTED NONE DETECTED   Tetrahydrocannabinol NONE DETECTED NONE DETECTED   Barbiturates NONE DETECTED NONE DETECTED    Comment: (NOTE) DRUG SCREEN FOR MEDICAL PURPOSES ONLY.  IF CONFIRMATION IS NEEDED FOR ANY PURPOSE, NOTIFY LAB WITHIN 5 DAYS. LOWEST DETECTABLE LIMITS FOR URINE DRUG SCREEN Drug Class                     Cutoff (ng/mL) Amphetamine and metabolites    1000 Barbiturate and metabolites    200 Benzodiazepine  200 Tricyclics and metabolites     300 Opiates and metabolites        300 Cocaine and metabolites        300 THC                            50 Performed at [facility], [address], [place], [place] [address]   Glucose, capillary     Status: Abnormal   Collection Time: 10/31/19  8:07 PM  Result Value Ref Range   Glucose-Capillary 189 (H) 70 - 99 mg/dL   Comment 1 Notify RN    Comment 2 Document in Chart   Glucose, capillary     Status: Abnormal   Collection Time: 11/01/19  5:49 AM  Result Value Ref Range   Glucose-Capillary 181 (H) 70 - 99 mg/dL   Comment 1 Notify RN    Comment 2 Document in Chart   Glucose, capillary     Status:  Abnormal   Collection Time: 11/01/19 11:49 AM  Result Value Ref Range   Glucose-Capillary 190 (H) 70 - 99 mg/dL   Comment 1 Notify RN    Comment 2 Document in Chart     Blood Alcohol level:  Lab Results  Component Value Date   ETH <10 06/21/2019   ETH <10 06/16/2019    Metabolic Disorder Labs: Lab Results  Component Value Date   HGBA1C 9.4 (H) 10/30/2019   MPG 223.08 10/30/2019   MPG 231.69 06/25/2019   No results found for: PROLACTIN Lab Results  Component Value Date   CHOL 263 (H) 06/16/2019   TRIG 376 (H) 06/16/2019   HDL 37 (L) 06/16/2019   CHOLHDL 7.1 06/16/2019   VLDL 75 (H) 06/16/2019   LDLCALC 151 (H) 06/16/2019   LDLCALC 68 11/20/2018    Physical Findings: AIMS: Facial and Oral Movements Muscles of Facial Expression: None, normal Lips and Perioral Area: None, normal Jaw: None, normal Tongue: None, normal,Extremity Movements Upper (arms, wrists, hands, fingers): None, normal Lower (legs, knees, ankles, toes): None, normal, Trunk Movements Neck, shoulders, hips: None, normal, Overall Severity Severity of abnormal movements (highest score from questions above): None, normal Incapacitation due to abnormal movements: None, normal Patient's awareness of abnormal movements (rate only patient's report): No Awareness, Dental Status Current problems with teeth and/or dentures?: No Does patient usually wear dentures?: No  CIWA:  CIWA-Ar Total: 1 COWS:  COWS Total Score: 1  Musculoskeletal: Strength & Muscle Tone: within normal limits Gait & Station: normal Patient leans: N/A  Psychiatric Specialty Exam: Physical Exam  Nursing note and vitals reviewed. Constitutional: He is oriented to person, place, and time. He appears well-developed and well-nourished.  HENT:  Head: Normocephalic and atraumatic.  Respiratory: Effort normal.  Neurological: He is alert and oriented to person, place, and time.    ROS  Blood pressure (!) 142/81, pulse 89, temperature 97.9  F (36.6 C), temperature source Oral, resp. rate 15, height 5\' 9"  (1.753 m), weight 99.8 kg, SpO2 100 %.Body mass index is 32.49 kg/m.  General Appearance: Casual  Eye Contact:  Fair  Speech:  Normal Rate  Volume:  Normal  Mood:  Depressed  Affect:  Congruent  Thought Process:  Coherent and Descriptions of Associations: Circumstantial  Orientation:  Full (Time, Place, and Person)  Thought Content:  Logical  Suicidal Thoughts:  No  Homicidal Thoughts:  No  Memory:  Immediate;   Fair Recent;   Fair Remote;  Fair  Judgement:  Intact  Insight:  Lacking  Psychomotor Activity:  Normal  Concentration:  Concentration: Fair and Attention Span: Fair  Recall:  [employer] of Knowledge:  Good  Language:  Good  Akathisia:  Negative  Handed:  Right  AIMS (if indicated):     Assets:  Desire for Improvement Resilience  ADL's:  Intact  Cognition:  WNL  Sleep:  Number of Hours: 6.5     Treatment Plan Summary: Daily contact with patient to assess and evaluate symptoms and progress in treatment, Medication management and Plan : Patient is seen and examined.  Patient is a 63 year old male with the above-stated past psychiatric history who is seen in follow-up.   Diagnosis: #1 major depression, recurrent, severe without psychotic features, #2 poorly controlled diabetes mellitus, #3 essential hypertension.  Patient is seen in follow-up.  He is essentially unchanged from yesterday.  I will increase his gabapentin to 300 mg p.o. twice daily.  I will also increase his Levemir insulin to 40 units subcu daily and 45 units subcu nightly.  His blood pressure is elevated as well, and that may be leading to part of his headache symptoms.  I will increase his metoprolol to 50 mg p.o. twice daily.  He will continue on his current sliding scale insulin dosage, Lipitor, and Protonix.  Hopefully we can find housing for him.  1.  Continue coated aspirin 81 mg p.o. daily for heart health. 2.  Increase gabapentin  to 300 mg p.o. twice daily for chronic pain and anxiety. 3.  Continue hydroxyzine 25 mg p.o. 3 times daily as needed anxiety. 4.  Continue NovoLog insulin for sliding scale 3 times daily before meals and at bedtime. 5.  Continue NovoLog 8 units subcu 3 times daily AC for diabetes mellitus. 6.    Increase Levemir to 40 units subcu daily and 45 units subcu nightly for diabetes mellitus. 7.    Increase metoprolol to 50 mg p.o. twice daily for tachycardia and hypertension. 8.  Continue Protonix 40 mg p.o. daily for gastric protection. 9.  Continue Lipitor 40 mg p.o. daily for hyperlipidemia. 10.  Continue sertraline 50 mg p.o. daily for depression and anxiety. 11.    Add ibuprofen 600 mg p.o. every 6 hours as needed headache or pain. 12.  Disposition planning-in progress.  [name], MD 11/01/2019, 12:57 PM

## 2019-11-01 NOTE — Plan of Care (Signed)
Progress note  D: pt found in bed; compliant with medication administration. Pt is pleasant upon approach, but guarded, minimal and sullen. Pt denies any anxiety or depression. Pt does complain of lower back pain that they rate at a 5/10. Pt denies any needs at this time. Pt denies si/hi/ah/vh and verbally agrees to approach staff if these become apparent or before harming themself/others while at [facility].  A: Pt provided support and encouragement. Pt given medication per protocol and standing orders. Q51m safety checks implemented and continued.  R: Pt safe on the unit. Will continue to monitor.  Pt progressing in the following metrics  Problem: Education: Goal: Knowledge of [facility] Education information/materials will improve Outcome: Progressing Goal: Emotional status will improve Outcome: Progressing Goal: Mental status will improve Outcome: Progressing Goal: Verbalization of understanding the information provided will improve Outcome: Progressing

## 2019-11-01 NOTE — [facility] Group Notes (Signed)
Type of Therapy and Topic: Group Therapy: Trust and Honesty  Participation Level: Minimal   Description of Group:  In this group patients will be asked to explore the value of being honest. Patients will be guided to discuss their thoughts, feelings, and behaviors related to honesty and trusting in others. Patients will process together how trust and honesty relate to forming relationships with peers, family members, and self. Each patient will be challenged to identify and express feelings of being vulnerable. Patients will discuss reasons why people are dishonest and identify alternative outcomes if one was truthful (to self or others). This group will be process-oriented, with patients participating in exploration of their own experiences, giving and receiving support, and processing challenge from other group members.  Therapeutic Goals: 1. Patient will identify why honesty is important to relationships and how honesty overall affects relationships. 2. Patient will identify a situation where they lied or were lied too and the feelings, thought process, and behaviors surrounding the situation 3. Patient will identify the meaning of being vulnerable, how that feels, and how that correlates to being honest with self and others. 4. Patient will identify situations where they could have told the truth, but instead lied and explain reasons of dishonesty.  Summary of Patient Progress:  [name] [name] was attentive during the group session, however he did not contribute much to the conversation. However, he did state that sometimes he is untruthful to protect the other person's feelings.   Therapeutic Modalities:  Cognitive Behavioral Therapy Solution Focused Therapy Motivational Interviewing Brief Therapy   [name] [name], MSW, LCSW Clinical Social Worker [facility]  Phone: [phone]

## 2019-11-01 NOTE — Progress Notes (Signed)
NUTRITION ASSESSMENT  Pt identified as at risk on the Malnutrition Screen Tool  INTERVENTION: 1. Supplements: Glucerna Shake po BID, each supplement provides 220 kcal and 10 grams of protein  NUTRITION DIAGNOSIS: Unintentional weight loss related to sub-optimal intake as evidenced by pt report.   Goal: Pt to meet >/= 90% of their estimated nutrition needs.  Monitor:  PO intake  Assessment:  Pt admitted with depression. Pt currently homeless. Per weight records, pt has lost 10 lbs since 7/22 ( 4% wt loss x 4.5 months, insignificant for time frame). Will order Glucerna shakes given history of type 2 diabetes.  Height: Ht Readings from Last 1 Encounters:  10/30/19 5\' 9"  (1.753 m)    Weight: Wt Readings from Last 1 Encounters:  10/30/19 99.8 kg    Weight Hx: Wt Readings from Last 10 Encounters:  10/30/19 99.8 kg  07/26/19 101.2 kg  06/24/19 104.8 kg  06/21/19 104 kg  06/21/19 104.8 kg  06/15/19 105.2 kg  05/17/19 104.3 kg  05/05/19 107.2 kg  04/20/19 112.9 kg  02/22/19 106.6 kg    BMI:  Body mass index is 32.49 kg/m. Pt meets criteria for obesity based on current BMI.  Estimated Nutritional Needs: Kcal: 25-30 kcal/kg Protein: > 1 gram protein/kg Fluid: 1 ml/kcal  Diet Order:  Diet Order            Diet Carb Modified Fluid consistency: Thin; Room service appropriate? Yes  Diet effective now             Pt is also offered choice of unit snacks mid-morning and mid-afternoon.  Pt is eating as desired.   Lab results and medications reviewed.   Clayton Bibles, MS, RD, LDN Inpatient Clinical Dietitian Pager: [phone] After Hours Pager: [phone]

## 2019-11-02 LAB — GLUCOSE, CAPILLARY
Glucose-Capillary: 233 mg/dL — ABNORMAL HIGH (ref 70–99)
Glucose-Capillary: 97 mg/dL (ref 70–99)
Glucose-Capillary: 353 mg/dL — ABNORMAL HIGH (ref 70–99)
Glucose-Capillary: 416 mg/dL — ABNORMAL HIGH (ref 70–99)

## 2019-11-02 MED ORDER — SERTRALINE HCL 100 MG PO TABS
100.0000 mg | ORAL_TABLET | Freq: Every day | ORAL | Status: DC
  Administered 2019-11-03 – 2019-11-06 (×4): 100 mg via ORAL
  Filled 2019-11-02 (×6): qty 1

## 2019-11-02 MED ORDER — INSULIN DETEMIR 100 UNIT/ML ~~LOC~~ SOLN
45.0000 [IU] | Freq: Every day | SUBCUTANEOUS | Status: DC
  Administered 2019-11-03: 45 [IU] via SUBCUTANEOUS

## 2019-11-02 MED ORDER — INSULIN DETEMIR 100 UNIT/ML ~~LOC~~ SOLN
50.0000 [IU] | Freq: Every day | SUBCUTANEOUS | Status: DC
  Administered 2019-11-02: 50 [IU] via SUBCUTANEOUS

## 2019-11-02 NOTE — Progress Notes (Signed)
   11/02/19 0035  Psych Admission Type (Psych Patients Only)  Admission Status Voluntary  Psychosocial Assessment  Patient Complaints None  Eye Contact Fair  Facial Expression Other (Comment) (bright affect)  Affect Appropriate to circumstance  Speech Logical/coherent  Interaction Assertive  Appearance/Hygiene Unremarkable  Behavior Characteristics Cooperative;Appropriate to situation  Mood Pleasant  Thought Process  Coherency WDL  Content WDL  Delusions WDL  Perception WDL  Hallucination None reported or observed  Judgment WDL  Confusion WDL  Danger to Self  Current suicidal ideation? Denies  Danger to Others  Danger to Others None reported or observed  No active SI tonight. Pleasant and cooperative on the unit. Blood sugars remain elevated.

## 2019-11-02 NOTE — [facility] Group Notes (Signed)
Adult Psychoeducational Group Note  Date:  11/02/2019 Time:  10:14 PM  Group Topic/Focus:  Wrap-Up Group:   The focus of this group is to help patients review their daily goal of treatment and discuss progress on daily workbooks.  Participation Level:  Minimal  Participation Quality:  Appropriate and Attentive  Affect:  Depressed and Flat  Cognitive:  Alert and Appropriate  Insight: Lacking  Engagement in Group:  Engaged  Modes of Intervention:  Discussion and Education  Additional Comments:  Pt attended and participated in wrap up group this evening and rated their day a 5/10. Pt had been "going through a lot". Pt goal is to be discharged, but pt has not received discharge date yet.  Cristi Loron 11/02/2019, 10:14 PM

## 2019-11-02 NOTE — Progress Notes (Signed)
[place] NOVEL CORONAVIRUS (COVID-19) DAILY CHECK-OFF SYMPTOMS - answer yes or no to each - every day NO YES  Have you had a fever in the past 24 hours?  . Fever (Temp > 37.80C / 100F) X   Have you had any of these symptoms in the past 24 hours? . New Cough .  Sore Throat  .  Shortness of Breath .  Difficulty Breathing .  Unexplained Body Aches   X   Have you had any one of these symptoms in the past 24 hours not related to allergies?   . Runny Nose .  Nasal Congestion .  Sneezing   X   If you have had runny nose, nasal congestion, sneezing in the past 24 hours, has it worsened?  X   EXPOSURES - check yes or no X   Have you traveled outside the state in the past 14 days?  X   Have you been in contact with someone with a confirmed diagnosis of COVID-19 or PUI in the past 14 days without wearing appropriate PPE?  X   Have you been living in the same home as a person with confirmed diagnosis of COVID-19 or a PUI (household contact)?    X   Have you been diagnosed with COVID-19?    X              What to do next: Answered NO to all: Answered YES to anything:   Proceed with unit schedule Follow the [facility] Inpatient Flowsheet.   

## 2019-11-02 NOTE — [facility] Suicide Risk Assessment (Signed)
[place] INPATIENT:  Family/Significant Other Suicide Prevention Education  Suicide Prevention Education:  Education Completed;  Garlan Fillers, ex-wife, [phone], has been identified by the patient as the family member/significant other with whom the patient will be residing, and identified as the person(s) who will aid the patient in the event of a mental health crisis (suicidal ideations/suicide attempt).  With written consent from the patient, the family member/significant other has been provided the following suicide prevention education, prior to the and/or following the discharge of the patient.  The suicide prevention education provided includes the following:  Suicide risk factors  Suicide prevention and interventions  National Suicide Hotline telephone number  [facility] assessment telephone number  [facility] Emergency Assistance [place] and/or Residential Mobile Crisis Unit telephone number  Request made of family/significant other to:  Remove weapons (e.g., guns, rifles, knives), all items previously/currently identified as safety concern.    Remove drugs/medications (over-the-counter, prescriptions, illicit drugs), all items previously/currently identified as a safety concern.  The family member/significant other verbalizes understanding of the suicide prevention education information provided.  The family member/significant other agrees to remove the items of safety concern listed above.  Pt's wife reports that the patient has been battling depression "for some time".  She reports that the patient has "been going downhill ever since the loss of his brother" in 2018 or 2019.  She reports that patient was further triggered when they separated after a 15-year marriage.  She reports that patient is not able to stay with his daughter as the daughter lives in student housing.  She reports a belief that patient is currently triggered by homelessness.Rozann Lesches 11/02/2019, 2:42 PM

## 2019-11-02 NOTE — Progress Notes (Signed)
Patient Self Inventory Sheet, Pt reports fair appetite, energy low, and fair sleeping pattern. Pt rates his depression 7 out of 10, hopelessness,  10 out of 10, and anxiety at 4 out of 10. Pt states he has SI almost all the time without  a plan. Pt reports his physical problems includes  headaches, and pain at  5 out of 10. His pain is located in back, and hips areas. Pt denies using any medications for pain. Pt states his most important goal is to find a place to live . Medication given as Rx, safety maintained with 15 minute checks. Vital signs monitored.

## 2019-11-02 NOTE — Progress Notes (Signed)
[facility] MD Progress Note  11/02/2019 11:22 AM [name] [name]   MRN:  [id number]  Subjective: [name] [name] reports, "I'm tired of living like this. I can't any help that I need. I have no home. I cannot survive on the streets, not with my health problems. My diabetes is at it's worse. I was living at the [facility], I left there to go spend thanksgiving with my family, returned, but they would not allow me back in because of the bad covid out-break. I ended up here but, no help for me because I don't have substance abuse problems. If I had substance abuse problems, I would been accepted into some place of sober living. I'm depressed real bad. I got bad back pain, my sugar is out of control, I have had heart attack. I just don't know what to do"   Objective:   Patient is a 63 year old male with a known past psychiatric history significant for major depression who presented with suicidal ideation and worsening depression on 10/30/2019 and was admitted to the hospital. [name] [name] is seen for follow-up care evaluation, chart reviewed. The chart findings discussed with the treatment team. He is essentially unchanged from yesterday. He is crying with his facial areas reddened & irritated. He states that he has had similar break-out in the past, but does not know the cause of it. He does complain of a headache pains, back pains & his blood sugar being out of control. Patient appears to have not been following his diabetic diet recommendations. He does have some honey bun saved up for snack in his room. He is instructed & encouraged that such meal snacks are not good for him or his blood sugar. His blood sugar this morning was 233. His Lantus insulin doses has been adjusted to meet his needs. Also, his Sertraline has been increased to 100 mg daily to help combat the symptoms of depression. We discussed his housing/homeless situation. The SW will be providing patient with places with their phone numbers to call for  accomodation. Patient is however pessimistic about finding any living accommodation at this time.  He admitted to helplessness, hopelessness & feeling of worthlessness but denied suicidal ideation. However, he did say that he is tired of leaving like this.  His blood pressure this morning was 152/97, pulse was 75.  He remains afebrile. Patient denies any AVH, delusional thoughts or paranoia.  Principal Problem: <principal problem not specified> Diagnosis: Active Problems:   Severe recurrent major depression without psychotic features ([place])  Total Time spent with patient: 15 minutes  Past Psychiatric History: See admission H&P  Past Medical History:  Past Medical History:  Diagnosis Date  . Anxiety   . Barrett's esophagus   . CAD (coronary artery disease)    a. NSTEMI 05/2014 - occluded RCA dominant proximal s/p asp-thrombectomy/DES to RCA, minimal LAD/LCx, EF 50% by cath, 65-70% by echo.  . Depression   . Diabetes type 2, uncontrolled ([place])   . Former tobacco use   . GERD (gastroesophageal reflux disease)   . Hemorrhoids    hx of  . Hypercholesterolemia   . Hypertension   . MI (myocardial infarction) ([place])   . Peripheral neuropathy     Past Surgical History:  Procedure Laterality Date  . LEFT HEART CATHETERIZATION WITH CORONARY ANGIOGRAM N/A 06/14/2014   Procedure: LEFT HEART CATHETERIZATION WITH CORONARY ANGIOGRAM;  Surgeon: Jettie Booze, MD;  Location: [facility] CATH LAB;  Service: Cardiovascular;  Laterality: N/A;  . TONSILLECTOMY  Family History:  Family History  Problem Relation Age of Onset  . Hypertension Mother   . Alzheimer's disease Mother   . Alcohol abuse Father   . Cancer Father 33       "Throat"  . Diabetes type II Brother   . Cancer Brother 68       throat cancer  . Heart disease Neg Hx    Family Psychiatric  History: See admission H&P  Social History:  Social History   Substance and Sexual Activity  Alcohol Use No  . Alcohol/week: 1.0 standard  drinks  . Types: 1 Standard drinks or equivalent per week     Social History   Substance and Sexual Activity  Drug Use No    Social History   Socioeconomic History  . Marital status: Single    Spouse name: Not on file  . Number of children: Not on file  . Years of education: Not on file  . Highest education level: Not on file  Occupational History  . Occupation: unemployed    Comment: applying for disability  Social Needs  . Financial resource strain: Not on file  . Food insecurity    Worry: Not on file    Inability: Not on file  . Transportation needs    Medical: Not on file    Non-medical: Not on file  Tobacco Use  . Smoking status: Former Smoker    Packs/day: 0.25    Years: 3.00    Pack years: 0.75    Quit date: 11/30/1998    Years since quitting: 20.9  . Smokeless tobacco: Never Used  Substance and Sexual Activity  . Alcohol use: No    Alcohol/week: 1.0 standard drinks    Types: 1 Standard drinks or equivalent per week  . Drug use: No  . Sexual activity: Yes    Birth control/protection: None  Lifestyle  . Physical activity    Days per week: Not on file    Minutes per session: Not on file  . Stress: Not on file  Relationships  . Social Musician on phone: Not on file    Gets together: Not on file    Attends religious service: Not on file    Active member of club or organization: Not on file    Attends meetings of clubs or organizations: Not on file    Relationship status: Not on file  Other Topics Concern  . Not on file  Social History Narrative  . Not on file   Additional Social History:  Pain Medications: see MAR Prescriptions: see MAR Over the Counter: see MAR History of alcohol / drug use?: No history of alcohol / drug abuse Longest period of sobriety (when/how long): unknown Negative Consequences of Use: Financial, Personal relationships, Work / School Withdrawal Symptoms: Other (Comment)(denied withdrawals)  Sleep:  Good  Appetite:  Good  Current Medications: Current Facility-Administered Medications  Medication Dose Route Frequency Provider Last Rate Last Dose  . alum & mag hydroxide-simeth (MAALOX/MYLANTA) 200-200-20 MG/5ML suspension 30 mL  30 mL Oral Q4H PRN [name], NP      . aspirin EC tablet 81 mg  81 mg Oral Daily [name], NP   81 mg at 11/02/19 0735  . feeding supplement (GLUCERNA SHAKE) (GLUCERNA SHAKE) liquid 237 mL  237 mL Oral BID BM Nelly Rout, MD   237 mL at 11/01/19 1811  . gabapentin (NEURONTIN) capsule 300 mg  300 mg Oral BID [name],  MD   300 mg at 11/02/19 0735  . HYDROcodone-acetaminophen (NORCO/VICODIN) 5-325 MG per tablet 1 tablet  1 tablet Oral Q12H PRN Cobos, Rockey SituFernando A, MD   1 tablet at 11/02/19 0650  . hydrOXYzine (ATARAX/VISTARIL) tablet 25 mg  25 mg Oral TID PRN Nira ConnBerry, Jason A, NP      . ibuprofen (ADVIL) tablet 600 mg  600 mg Oral Q6H PRN Antonieta Pertlary, Greg Lawson, MD   600 mg at 11/01/19 1711  . insulin aspart (novoLOG) injection 0-9 Units  0-9 Units Subcutaneous TID WC Jackelyn PolingBerry, Jason A, NP   3 Units at 11/02/19 [phone]  . insulin aspart (novoLOG) injection 8 Units  8 Units Subcutaneous TID WC Alessandra BevelsKamineni, Neelima, MD   8 Units at 11/02/19 0651  . [START ON 11/03/2019] insulin detemir (LEVEMIR) injection 45 Units  45 Units Subcutaneous Daily Antonieta Pertlary, Greg Lawson, MD      . insulin detemir (LEVEMIR) injection 50 Units  50 Units Subcutaneous QHS Antonieta Pertlary, Greg Lawson, MD      . lisinopril (ZESTRIL) tablet 20 mg  20 mg Oral Daily Nira ConnBerry, Jason A, NP   20 mg at 11/02/19 0735  . magnesium hydroxide (MILK OF MAGNESIA) suspension 30 mL  30 mL Oral Daily PRN Nira ConnBerry, Jason A, NP      . metoprolol tartrate (LOPRESSOR) tablet 50 mg  50 mg Oral BID Antonieta Pertlary, Greg Lawson, MD   50 mg at 11/02/19 0736  . pantoprazole (PROTONIX) EC tablet 40 mg  40 mg Oral Daily Nira ConnBerry, Jason A, NP   40 mg at 11/02/19 0736  . rosuvastatin (CRESTOR) tablet 40 mg  40 mg Oral Daily Nira ConnBerry, Jason A, NP   40 mg  at 11/02/19 0736  . [START ON 11/03/2019] sertraline (ZOLOFT) tablet 100 mg  100 mg Oral Daily Antonieta Pertlary, Greg Lawson, MD        Lab Results:  Results for orders placed or performed during the hospital encounter of 10/30/19 (from the past 48 hour(s))  Glucose, capillary     Status: Abnormal   Collection Time: 10/31/19 11:55 AM  Result Value Ref Range   Glucose-Capillary 159 (H) 70 - 99 mg/dL   Comment 1 Notify RN    Comment 2 Document in Chart   Glucose, capillary     Status: Abnormal   Collection Time: 10/31/19  5:00 PM  Result Value Ref Range   Glucose-Capillary 124 (H) 70 - 99 mg/dL   Comment 1 Notify RN    Comment 2 Document in Chart   Rapid urine drug screen (hospital performed)     Status: Abnormal   Collection Time: 10/31/19  5:48 PM  Result Value Ref Range   Opiates POSITIVE (A) NONE DETECTED   Cocaine NONE DETECTED NONE DETECTED   Benzodiazepines NONE DETECTED NONE DETECTED   Amphetamines NONE DETECTED NONE DETECTED   Tetrahydrocannabinol NONE DETECTED NONE DETECTED   Barbiturates NONE DETECTED NONE DETECTED    Comment: (NOTE) DRUG SCREEN FOR MEDICAL PURPOSES ONLY.  IF CONFIRMATION IS NEEDED FOR ANY PURPOSE, NOTIFY LAB WITHIN 5 DAYS. LOWEST DETECTABLE LIMITS FOR URINE DRUG SCREEN Drug Class                     Cutoff (ng/mL) Amphetamine and metabolites    1000 Barbiturate and metabolites    200 Benzodiazepine                 200 Tricyclics and metabolites     300 Opiates and metabolites  300 Cocaine and metabolites        300 THC                            50 Performed at [facility], [address] [place], [place] [address]   Glucose, capillary     Status: Abnormal   Collection Time: 10/31/19  8:07 PM  Result Value Ref Range   Glucose-Capillary 189 (H) 70 - 99 mg/dL   Comment 1 Notify RN    Comment 2 Document in Chart   Glucose, capillary     Status: Abnormal   Collection Time: 11/01/19  5:49 AM  Result Value Ref Range    Glucose-Capillary 181 (H) 70 - 99 mg/dL   Comment 1 Notify RN    Comment 2 Document in Chart   Glucose, capillary     Status: Abnormal   Collection Time: 11/01/19 11:49 AM  Result Value Ref Range   Glucose-Capillary 190 (H) 70 - 99 mg/dL   Comment 1 Notify RN    Comment 2 Document in Chart   Glucose, capillary     Status: Abnormal   Collection Time: 11/01/19  4:47 PM  Result Value Ref Range   Glucose-Capillary 282 (H) 70 - 99 mg/dL   Comment 1 Notify RN    Comment 2 Document in Chart   Glucose, capillary     Status: Abnormal   Collection Time: 11/01/19  9:15 PM  Result Value Ref Range   Glucose-Capillary 362 (H) 70 - 99 mg/dL  Glucose, capillary     Status: Abnormal   Collection Time: 11/02/19  5:50 AM  Result Value Ref Range   Glucose-Capillary 233 (H) 70 - 99 mg/dL   Comment 1 Notify RN    Blood Alcohol level:  Lab Results  Component Value Date   ETH <10 06/21/2019   ETH <10 06/16/2019   Metabolic Disorder Labs: Lab Results  Component Value Date   HGBA1C 9.4 (H) 10/30/2019   MPG 223.08 10/30/2019   MPG 231.69 06/25/2019   No results found for: PROLACTIN Lab Results  Component Value Date   CHOL 263 (H) 06/16/2019   TRIG 376 (H) 06/16/2019   HDL 37 (L) 06/16/2019   CHOLHDL 7.1 06/16/2019   VLDL 75 (H) 06/16/2019   LDLCALC 151 (H) 06/16/2019   LDLCALC 68 11/20/2018   Physical Findings: AIMS: Facial and Oral Movements Muscles of Facial Expression: None, normal Lips and Perioral Area: None, normal Jaw: None, normal Tongue: None, normal,Extremity Movements Upper (arms, wrists, hands, fingers): None, normal Lower (legs, knees, ankles, toes): None, normal, Trunk Movements Neck, shoulders, hips: None, normal, Overall Severity Severity of abnormal movements (highest score from questions above): None, normal Incapacitation due to abnormal movements: None, normal Patient's awareness of abnormal movements (rate only patient's report): No Awareness, Dental  Status Current problems with teeth and/or dentures?: No Does patient usually wear dentures?: No  CIWA:  CIWA-Ar Total: 1 COWS:  COWS Total Score: 1  Musculoskeletal: Strength & Muscle Tone: within normal limits Gait & Station: normal Patient leans: N/A  Psychiatric Specialty Exam: Physical Exam  Nursing note and vitals reviewed. Constitutional: He is oriented to person, place, and time. He appears well-developed and well-nourished.  HENT:  Head: Normocephalic and atraumatic.  Neck: Normal range of motion.  Cardiovascular:  Hx. HTN  Respiratory: Effort normal.  Neurological: He is alert and oriented to person, place, and time.    Review of Systems  Constitutional: Negative for chills and fever.  Respiratory: Negative for cough, shortness of breath and wheezing.   Cardiovascular: Negative for chest pain and palpitations.  Gastrointestinal: Negative for abdominal pain, heartburn, nausea and vomiting.  Musculoskeletal: Positive for back pain and myalgias.  Skin:       Some irritation to facial areas.  Neurological: Negative for dizziness and headaches.  Psychiatric/Behavioral: Positive for depression, substance abuse (Hx. opioid/THC use disorders) and suicidal ideas. Negative for hallucinations and memory loss. The patient is nervous/anxious and has insomnia.     Blood pressure (!) 152/79, pulse 75, temperature 97.9 F (36.6 C), temperature source Oral, resp. rate 15, height 5\' 9"  (1.753 m), weight 99.8 kg, SpO2 100 %.Body mass index is 32.49 kg/m.  General Appearance: Casual  Eye Contact:  Fair  Speech:  Normal Rate  Volume:  Normal  Mood:  Depressed  Affect:  Congruent  Thought Process:  Coherent and Descriptions of Associations: Circumstantial  Orientation:  Full (Time, Place, and Person)  Thought Content:  Logical  Suicidal Thoughts:  No  Homicidal Thoughts:  No  Memory:  Immediate;   Fair Recent;   Fair Remote;   Fair  Judgement:  Intact  Insight:  Lacking   Psychomotor Activity:  Normal  Concentration:  Concentration: Fair and Attention Span: Fair  Recall:  [employer]Fair  Fund of Knowledge:  Good  Language:  Good  Akathisia:  Negative  Handed:  Right  AIMS (if indicated):     Assets:  Desire for Improvement Resilience  ADL's:  Intact  Cognition:  WNL  Sleep:  Number of Hours: 6.75   Treatment Plan Summary: Daily contact with patient to assess and evaluate symptoms and progress in treatment and Medication management   -Continue inpatient hospitalization.  -Will continue today 11/02/2019 plan as below except where it is noted.  -Encourage participation in groups and therapeutic milieu  Diagnosis: #1 major depression, recurrent, severe without psychotic features,  #2 poorly controlled diabetes mellitus,  #3 essential hypertension.  Patient is seen in follow-up.  He is essentially unchanged from yesterday.  I will continue his gabapentin to 300 mg p.o. twice daily. Will increase his Levemir insulin to 45 units subcu daily and 50 units subcu nightly. He will continue on his current sliding scale insulin dosage, Lipitor, and Protonix.  Hopefully we can find housing for him.  1. - Continue coated aspirin 81 mg p.o. daily for heart health. 2.  - Continue gabapentin to 300 mg p.o. twice daily for chronic pain and anxiety. 3.  - Continue hydroxyzine 25 mg p.o. 3 times daily as needed anxiety. 4.  - Continue NovoLog insulin for sliding scale 3 times daily before meals and at bedtime. 5.  - Continue NovoLog 8 units subcu 3 times daily AC for diabetes mellitus. 6.    Increase Levemir to 45 units subcu daily and 50 units subcu nightly for diabetes mellitus. 7.   - Continue metoprolol to 50 mg p.o. twice daily for tachycardia and hypertension. 8.  - Continue Protonix 40 mg p.o. daily for gastric protection. 9.  - Continue Lipitor 40 mg p.o. daily for hyperlipidemia. 10.  - Increase sertraline to 100 mg p.o. daily for depression and anxiety. 11.  - Add  ibuprofen 600 mg p.o. every 6 hours as needed headache or pain. 12.  - Disposition planning-in progress.  Armandina StammerAgnes Terina Mcelhinny, NP, PMHNP, FNP-BC 11/02/2019, 11:22 AMPatient ID: [name] DamesKevin J [name], male   DOB: 12/07/1955, 63 y.o.   MRN: [id number]

## 2019-11-03 LAB — GLUCOSE, CAPILLARY
Glucose-Capillary: 212 mg/dL — ABNORMAL HIGH (ref 70–99)
Glucose-Capillary: 205 mg/dL — ABNORMAL HIGH (ref 70–99)
Glucose-Capillary: 201 mg/dL — ABNORMAL HIGH (ref 70–99)
Glucose-Capillary: 275 mg/dL — ABNORMAL HIGH (ref 70–99)

## 2019-11-03 MED ORDER — INSULIN ASPART 100 UNIT/ML ~~LOC~~ SOLN
10.0000 [IU] | Freq: Three times a day (TID) | SUBCUTANEOUS | Status: DC
  Administered 2019-11-03 – 2019-11-04 (×4): 10 [IU] via SUBCUTANEOUS

## 2019-11-03 MED ORDER — LISINOPRIL 20 MG PO TABS
20.0000 mg | ORAL_TABLET | ORAL | Status: AC
  Administered 2019-11-03: 20 mg via ORAL
  Filled 2019-11-03 (×2): qty 1

## 2019-11-03 MED ORDER — LISINOPRIL 40 MG PO TABS
40.0000 mg | ORAL_TABLET | Freq: Every day | ORAL | Status: DC
  Administered 2019-11-04 – 2019-11-06 (×3): 40 mg via ORAL
  Filled 2019-11-03 (×5): qty 1

## 2019-11-03 MED ORDER — INSULIN DETEMIR 100 UNIT/ML ~~LOC~~ SOLN
50.0000 [IU] | Freq: Two times a day (BID) | SUBCUTANEOUS | Status: DC
  Administered 2019-11-03 – 2019-11-06 (×6): 50 [IU] via SUBCUTANEOUS

## 2019-11-03 NOTE — [facility] Group Notes (Signed)
11/03/2019 8:45am Type of Group and Topic: Psychoeducational Group: Discharge Planning  Participation Level: Active  Description of Group Discharge planning group reviews patient's anticipated discharge plans and assists patients to anticipate and address any barriers to wellness/recovery in the community. Suicide prevention education is reviewed with patients in group. Therapeutic Goals 1. Patients will state their anticipated discharge plan and mental health aftercare 2. Patients will identify potential barriers to wellness in the community setting 3. Patients will engage in problem solving, solution focused discussion of ways to anticipate and address barriers to wellness/recovery   Summary of Patient Progress Plan for Discharge/Comments: [name] [name] reports he is very concerns regarding his discharge plans. He states that he is "freaking out" because he does not know where he will go at discharge while he awaits intake at [facility]. He states that he plans to continue to explore his options while in the hospital.     Transportation Means: To be determined    Supports: Patient's wife and daughter   Therapeutic Modalities: Motivational Interviewing     Radonna Ricker, MSW, [place] Worker [facility]  Phone: [phone] 11/03/2019 3:01 PM

## 2019-11-03 NOTE — Progress Notes (Signed)
   11/03/19 2330  Psych Admission Type (Psych Patients Only)  Admission Status Voluntary  Psychosocial Assessment  Patient Complaints Anxiety  Eye Contact Fair  Facial Expression Animated  Affect Appropriate to circumstance  Speech Logical/coherent;Slurred  Interaction Assertive;Attention-seeking  Motor Activity Slow  Appearance/Hygiene Unremarkable  Behavior Characteristics Cooperative  Mood Depressed;Anxious  Thought Process  Coherency WDL  Content WDL  Delusions None reported or observed  Perception WDL  Hallucination None reported or observed  Judgment Limited  Confusion None  Danger to Self  Current suicidal ideation? Denies  Danger to Others  Danger to Others None reported or observed  D: Patient in dayroom interacting with peers. Pt reports he made many phone calls today in search for placement after discharge.   A: Medications administered as prescribed. Support and encouragement provided as needed.  R: Patient remains safe on the unit. Will continue to monitor for safety and stability.

## 2019-11-03 NOTE — Plan of Care (Signed)
  Progress note  D: pt found in the dayroom interacting with peers; compliant with medication administration. Pt denies any physical complaints. Pt does still rate their lower back pain a 6-7/10. Pt provided medication. Pt denies anxiety/depression. Pt has been viewed making calls for follow up. Pt denies si/hi/ah/vh and verbally agrees to approach staff if these become apparent or before harming themself/others while at [place].  A: Pt provided support and encouragement. Pt given medication per protocol and standing orders. Q70m safety checks implemented and continued.  R: Pt safe on the unit. Will continue to monitor.  Pt progressing in the following metrics  Problem: Education: Goal: Ability to state activities that reduce stress will improve Outcome: Progressing   Problem: Coping: Goal: Ability to identify and develop effective coping behavior will improve Outcome: Progressing   Problem: Education: Goal: Utilization of techniques to improve thought processes will improve Outcome: Progressing Goal: Knowledge of the prescribed therapeutic regimen will improve Outcome: Progressing

## 2019-11-03 NOTE — Progress Notes (Signed)
Recreation Therapy Notes  Date: 12.4.20 Time: 0930 Location: [address] Dayroom  Group Topic: Stress Management  Goal Area(s) Addresses:  Patient will identify positive stress management techniques. Patient will identify benefits of using stress management post d/c.  Behavioral Response: Engaged  Intervention: Stress Management  Activity :  Meditation.  LRT played a meditation that focused on making the most of your day.  Patients were to listen and follow along with the meditation as it played to engage in the meditation.  Education:  Stress Management, Discharge Planning.   Education Outcome: Acknowledges Education  Clinical Observations/Feedback: Pt attended and participated in activity.     Victorino Sparrow, LRT/CTRS         Ria Comment, Jennessy Sandridge A 11/03/2019 11:12 AM

## 2019-11-03 NOTE — Tx Team (Signed)
Interdisciplinary Treatment and Diagnostic Plan Update  11/03/2019 Time of Session:  [name] [name] MRN: [id number]  Principal Diagnosis: <principal problem not specified>  Secondary Diagnoses: Active Problems:   Severe recurrent major depression without psychotic features (HCC)   Current Medications:  Current Facility-Administered Medications  Medication Dose Route Frequency Provider Last Rate Last Dose  . alum & mag hydroxide-simeth (MAALOX/MYLANTA) 200-200-20 MG/5ML suspension 30 mL  30 mL Oral Q4H PRN Lindon Romp A, NP      . aspirin EC tablet 81 mg  81 mg Oral Daily Lindon Romp A, NP   81 mg at 11/03/19 0803  . feeding supplement (GLUCERNA SHAKE) (GLUCERNA SHAKE) liquid 237 mL  237 mL Oral BID BM Hampton Abbot, MD   237 mL at 11/01/19 1811  . gabapentin (NEURONTIN) capsule 300 mg  300 mg Oral BID Sharma Covert, MD   300 mg at 11/03/19 0803  . HYDROcodone-acetaminophen (NORCO/VICODIN) 5-325 MG per tablet 1 tablet  1 tablet Oral Q12H PRN Cobos, Myer Peer, MD   1 tablet at 11/03/19 0806  . hydrOXYzine (ATARAX/VISTARIL) tablet 25 mg  25 mg Oral TID PRN Lindon Romp A, NP      . ibuprofen (ADVIL) tablet 600 mg  600 mg Oral Q6H PRN Sharma Covert, MD   600 mg at 11/02/19 2144  . insulin aspart (novoLOG) injection 0-9 Units  0-9 Units Subcutaneous TID WC Lindon Romp A, NP   3 Units at 11/03/19 0650  . insulin aspart (novoLOG) injection 8 Units  8 Units Subcutaneous TID WC Gu[place] Shi, MD   8 Units at 11/03/19 0649  . insulin detemir (LEVEMIR) injection 45 Units  45 Units Subcutaneous Daily Sharma Covert, MD   45 Units at 11/03/19 0804  . insulin detemir (LEVEMIR) injection 50 Units  50 Units Subcutaneous QHS Sharma Covert, MD   50 Units at 11/02/19 2145  . lisinopril (ZESTRIL) tablet 20 mg  20 mg Oral Daily Lindon Romp A, NP   20 mg at 11/03/19 0803  . magnesium hydroxide (MILK OF MAGNESIA) suspension 30 mL  30 mL Oral Daily PRN Lindon Romp A, NP      .  metoprolol tartrate (LOPRESSOR) tablet 50 mg  50 mg Oral BID Sharma Covert, MD   50 mg at 11/03/19 0803  . pantoprazole (PROTONIX) EC tablet 40 mg  40 mg Oral Daily Lindon Romp A, NP   40 mg at 11/03/19 0803  . rosuvastatin (CRESTOR) tablet 40 mg  40 mg Oral Daily Lindon Romp A, NP   40 mg at 11/03/19 0803  . sertraline (ZOLOFT) tablet 100 mg  100 mg Oral Daily Sharma Covert, MD   100 mg at 11/03/19 0802   PTA Medications: Medications Prior to Admission  Medication Sig Dispense Refill Last Dose  . HYDROcodone-acetaminophen (NORCO) 7.5-325 MG tablet Take 1 tablet by mouth 4 (four) times daily as needed.     Marland Kitchen LEVEMIR FLEXTOUCH 100 UNIT/ML Pen Inject 40 Units into the skin 2 (two) times daily.     Marland Kitchen aspirin (EQ ASPIRIN ADULT LOW DOSE) 81 MG EC tablet Take 1 tablet (81 mg total) by mouth daily. Swallow whole: For heart health 30 tablet 12   . gabapentin (NEURONTIN) 300 MG capsule Take 1 capsule (300 mg total) by mouth 2 (two) times daily. For pain/neuropathy 60 capsule 0   . insulin aspart (NOVOLOG) 100 UNIT/ML injection Inject 0-15 Units into the skin 3 (three) times daily before meals. 121-150=2 units,  151-200=4 units, 201-250=7 units, 251-300=9 units, 301-350=12 units, >351=15 units     . lisinopril (ZESTRIL) 20 MG tablet Take 1 tablet (20 mg total) by mouth daily. For high blood pressure 10 tablet 0   . metoprolol tartrate (LOPRESSOR) 25 MG tablet Take 1 tablet (25 mg total) by mouth 2 (two) times daily. 60 tablet 0   . nitroGLYCERIN (NITROSTAT) 0.4 MG SL tablet Place 1 tablet (0.4 mg total) under the tongue every 5 (five) minutes as needed for chest pain (up to 3 doses). 25 tablet 0   . pantoprazole (PROTONIX) 40 MG tablet Take 1 tablet (40 mg total) by mouth daily. 90 tablet 0   . rosuvastatin (CRESTOR) 40 MG tablet TAKE 1 TABLET(40 MG) BY MOUTH DAILY (Patient taking differently: Take 40 mg by mouth daily. ) 90 tablet 0     Patient Stressors:    Patient Strengths:    Treatment  Modalities: Medication Management, Group therapy, Case management,  1 to 1 session with clinician, Psychoeducation, Recreational therapy.   Physician Treatment Plan for Primary Diagnosis: <principal problem not specified> Long Term Goal(s): Improvement in symptoms so as ready for discharge Improvement in symptoms so as ready for discharge   Short Term Goals: Ability to identify changes in lifestyle to reduce recurrence of condition will improve Ability to verbalize feelings will improve Ability to disclose and discuss suicidal ideas Ability to demonstrate self-control will improve Ability to identify and develop effective coping behaviors will improve Ability to maintain clinical measurements within normal limits will improve Ability to identify changes in lifestyle to reduce recurrence of condition will improve Ability to verbalize feelings will improve Ability to disclose and discuss suicidal ideas Ability to demonstrate self-control will improve Ability to identify and develop effective coping behaviors will improve Ability to maintain clinical measurements within normal limits will improve  Medication Management: Evaluate patient's response, side effects, and tolerance of medication regimen.  Therapeutic Interventions: 1 to 1 sessions, Unit Group sessions and Medication administration.  Evaluation of Outcomes: Progressing  Physician Treatment Plan for Secondary Diagnosis: Active Problems:   Severe recurrent major depression without psychotic features (HCC)  Long Term Goal(s): Improvement in symptoms so as ready for discharge Improvement in symptoms so as ready for discharge   Short Term Goals: Ability to identify changes in lifestyle to reduce recurrence of condition will improve Ability to verbalize feelings will improve Ability to disclose and discuss suicidal ideas Ability to demonstrate self-control will improve Ability to identify and develop effective coping behaviors  will improve Ability to maintain clinical measurements within normal limits will improve Ability to identify changes in lifestyle to reduce recurrence of condition will improve Ability to verbalize feelings will improve Ability to disclose and discuss suicidal ideas Ability to demonstrate self-control will improve Ability to identify and develop effective coping behaviors will improve Ability to maintain clinical measurements within normal limits will improve     Medication Management: Evaluate patient's response, side effects, and tolerance of medication regimen.  Therapeutic Interventions: 1 to 1 sessions, Unit Group sessions and Medication administration.  Evaluation of Outcomes: Progressing   RN Treatment Plan for Primary Diagnosis: <principal problem not specified> Long Term Goal(s): Knowledge of disease and therapeutic regimen to maintain health will improve  Short Term Goals: Ability to participate in decision making will improve, Ability to verbalize feelings will improve, Ability to identify and develop effective coping behaviors will improve and Compliance with prescribed medications will improve  Medication Management: RN will administer medications as ordered by  provider, will assess and evaluate patient's response and provide education to patient for prescribed medication. RN will report any adverse and/or side effects to prescribing provider.  Therapeutic Interventions: 1 on 1 counseling sessions, Psychoeducation, Medication administration, Evaluate responses to treatment, Monitor vital signs and CBGs as ordered, Perform/monitor CIWA, COWS, AIMS and Fall Risk screenings as ordered, Perform wound care treatments as ordered.  Evaluation of Outcomes: Progressing   LCSW Treatment Plan for Primary Diagnosis: <principal problem not specified> Long Term Goal(s): Safe transition to appropriate next level of care at discharge, Engage patient in therapeutic group addressing  interpersonal concerns.  Short Term Goals: Engage patient in aftercare planning with referrals and resources  Therapeutic Interventions: Assess for all discharge needs, 1 to 1 time with Social worker, Explore available resources and support systems, Assess for adequacy in community support network, Educate family and significant other(s) on suicide prevention, Complete Psychosocial Assessment, Interpersonal group therapy.  Evaluation of Outcomes: Progressing   Progress in Treatment: Attending groups: Yes. Participating in groups: Yes. Taking medication as prescribed: Yes. Toleration medication: Yes. Family/Significant other contact made: Yes, individual(s) contacted:  the patient's wife Patient understands diagnosis: Yes. Discussing patient identified problems/goals with staff: Yes. Medical problems stabilized or resolved: Yes. Denies suicidal/homicidal ideation: Yes. Issues/concerns per patient self-inventory: No. Other:   New problem(s) identified: None   New Short Term/Long Term Goal(s):  medication stabilization, elimination of SI thoughts, development of comprehensive mental wellness plan.    Patient Goals:    Discharge Plan or Barriers: Patient is currently homeless and is seeking alternative housing arrangements. The patient's follow up referrals will be based upon his discharge plan. CSW will continue to follow and assess for appropriate referrals.   Reason for Continuation of Hospitalization: Anxiety Depression Medication stabilization Suicidal ideation  Estimated Length of Stay: 2-3 days  Attendees: Patient: 11/03/2019 9:43 AM  Physician: Dr. Landry Mellow, MD 11/03/2019 9:43 AM  Nursing: Ethelene Browns. A, RN 11/03/2019 9:43 AM  RN Care Manager: 11/03/2019 9:43 AM  Social Worker: Baldo Daub, LCSW 11/03/2019 9:43 AM  Recreational Therapist:  11/03/2019 9:43 AM  Other:  11/03/2019 9:43 AM  Other:  11/03/2019 9:43 AM  Other: 11/03/2019 9:43 AM    Scribe for Treatment  Team: Maeola Sarah, LCSWA 11/03/2019 9:43 AM

## 2019-11-03 NOTE — Progress Notes (Signed)
[facility] Notes:  (Nursing/MHT/Case Management/Adjunct)  Date:  11/03/2019  Time:  2030 Type of Therapy:  wrap up group  Participation Level:  Active  Participation Quality:  Appropriate, Attentive, Sharing and Supportive  Affect:  Blunted and Depressed  Cognitive:  Appropriate  Insight:  Improving  Engagement in Group:  Engaged  Modes of Intervention:  Clarification, Education and Support  Summary of Progress/Problems: Pt shared that he enjoyed talking to his wife today. Pt plans on changing his outlook to a more hopeful and grateful for another day instead of everything is not going my way and going wrong for me attitude. 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 Pt is grateful for his 4 children.   Shellia Cleverly 11/03/2019, 10:23 PM

## 2019-11-03 NOTE — Progress Notes (Addendum)
Patient is currently exploring options for potential housing/shelter beds. Patient submitted an application for the [facility] program at the [facility] for a potential shelter bed. According to the patient, he spoke with an Zenia Resides in admissions who stated he could come to the program, however intake would not be until 11/13/2019.   CSW explained to the patient that he more than likely would not be able to stay in the hospital until 11/13/19 and it was important for him to find other options for housing until he could go to the [facility]. Patient expressed understanding and stated he may have enough money to cover a hotel/motel room until he can get into the [facility].    CSW will continue to follow.    Radonna Ricker, MSW, LCSW Clinical Social Worker [facility]  Phone: [phone]

## 2019-11-03 NOTE — Progress Notes (Signed)
[facility]  11/03/2019 11:22 AM [name] DamesKevin J [name]  MRN:  [id number] Subjective:  Patient is a 63 year old male with a known past psychiatric history significant for major depression who presented with suicidal ideation and worsening depression on 10/30/2019 and was admitted to the hospital.   Objective: Patient is seen and examined.  Patient is a 63 year old male with the above-stated past psychiatric history who is seen in follow-up.  He is essentially unchanged.  We had a discussion about becoming more proactive in his care as well as seeking housing.  His motivation for change is limited.  We also discussed his diet.  Progress notes from yesterday site that the patient's blood sugar was significantly elevated, and that he had a sweet roll that was half eaten during his interview yesterday.  We discussed that as well.  He denied any suicidal ideation, but continues to have a great deal of helplessness, almost to the degree of dependent personality disorder.  His blood pressure is slightly elevated today at 145/84.  He is afebrile.  He slept 6.25 hours last night.  His blood sugar this morning is 212 despite increasing his Lantus insulin yesterday.  Principal Problem: <principal problem not specified> Diagnosis: Active Problems:   Severe recurrent major depression without psychotic features (HCC)  Total Time spent with patient: 20 minutes  Past Psychiatric History: See admission H&P  Past Medical History:  Past Medical History:  Diagnosis Date  . Anxiety   . Barrett's esophagus   . CAD (coronary artery disease)    a. NSTEMI 05/2014 - occluded RCA dominant proximal s/p asp-thrombectomy/DES to RCA, minimal LAD/LCx, EF 50% by cath, 65-70% by echo.  . Depression   . Diabetes type 2, uncontrolled (HCC)   . Former tobacco use   . GERD (gastroesophageal reflux disease)   . Hemorrhoids    hx of  . Hypercholesterolemia   . Hypertension   . MI (myocardial infarction) (HCC)   . Peripheral  neuropathy     Past Surgical History:  Procedure Laterality Date  . LEFT HEART CATHETERIZATION WITH CORONARY ANGIOGRAM N/A 06/14/2014   Procedure: LEFT HEART CATHETERIZATION WITH CORONARY ANGIOGRAM;  Surgeon: Corky CraftsJayadeep S Varanasi, MD;  [facility];  Service: Cardiovascular;  Laterality: N/A;  . TONSILLECTOMY     Family History:  Family History  Problem Relation Age of Onset  . Hypertension Mother   . Alzheimer's disease Mother   . Alcohol abuse Father   . Cancer Father [id number]       "Throat"  . Diabetes type II Brother   . Cancer Brother 68       throat cancer  . Heart disease Neg Hx    Family Psychiatric  History: See admission H&P Social History:  Social History   Substance and Sexual Activity  Alcohol Use No  . Alcohol/week: 1.0 standard drinks  . Types: 1 Standard drinks or equivalent per week     Social History   Substance and Sexual Activity  Drug Use No    Social History   Socioeconomic History  . Marital status: Single    Spouse name: Not on file  . Number of children: Not on file  . Years of education: Not on file  . Highest education level: Not on file  Occupational History  . Occupation: unemployed    Comment: applying for disability  Social Needs  . Financial resource strain: Not on file  . Food insecurity    Worry: Not on file  Inability: Not on file  . Transportation needs    Medical: Not on file    Non-medical: Not on file  Tobacco Use  . Smoking status: Former Smoker    Packs/day: 0.25    Years: 3.00    Pack years: 0.75    Quit date: 11/30/1998    Years since quitting: 20.9  . Smokeless tobacco: Never Used  Substance and Sexual Activity  . Alcohol use: No    Alcohol/week: 1.0 standard drinks    Types: 1 Standard drinks or equivalent per week  . Drug use: No  . Sexual activity: Yes    Birth control/protection: None  Lifestyle  . Physical activity    Days per week: Not on file    Minutes per session: Not on file  . Stress: Not  on file  Relationships  . Social Musician on phone: Not on file    Gets together: Not on file    Attends religious service: Not on file    Active member of club or organization: Not on file    Attends meetings of clubs or organizations: Not on file    Relationship status: Not on file  Other Topics Concern  . Not on file  Social History Narrative  . Not on file   Additional Social History:    Pain Medications: see MAR Prescriptions: see MAR Over the Counter: see MAR History of alcohol / drug use?: No history of alcohol / drug abuse Longest period of sobriety (when/how long): unknown Negative Consequences of Use: Financial, Personal relationships, Work / School Withdrawal Symptoms: Other (Comment)(denied withdrawals)                    Sleep: Good  Appetite:  Good  Current Medications: Current Facility-Administered Medications  Medication Dose Route Frequency Provider Last Rate Last Dose  . alum & mag hydroxide-simeth (MAALOX/MYLANTA) 200-200-20 MG/5ML suspension 30 mL  30 mL Oral Q4H PRN [name], NP      . aspirin EC tablet 81 mg  81 mg Oral Daily [name], NP   81 mg at 11/03/19 0803  . feeding supplement (GLUCERNA SHAKE) (GLUCERNA SHAKE) liquid 237 mL  237 mL Oral BID BM Nelly Rout, MD   237 mL at 11/03/19 1011  . gabapentin (NEURONTIN) capsule 300 mg  300 mg Oral BID [name], MD   300 mg at 11/03/19 0803  . HYDROcodone-acetaminophen (NORCO/VICODIN) 5-325 MG per tablet 1 tablet  1 tablet Oral Q12H PRN Cobos, [name], MD   1 tablet at 11/03/19 0806  . hydrOXYzine (ATARAX/VISTARIL) tablet 25 mg  25 mg Oral TID PRN [name], NP      . ibuprofen (ADVIL) tablet 600 mg  600 mg Oral Q6H PRN [name], MD   600 mg at 11/02/19 2144  . insulin aspart (novoLOG) injection 0-9 Units  0-9 Units Subcutaneous TID WC [name], NP   3 Units at 11/03/19 0650  . insulin aspart (novoLOG) injection 8 Units  8 Units  Subcutaneous TID WC Alessandra Bevels, MD   8 Units at 11/03/19 0649  . insulin detemir (LEVEMIR) injection 45 Units  45 Units Subcutaneous Daily [name], MD   45 Units at 11/03/19 0804  . insulin detemir (LEVEMIR) injection 50 Units  50 Units Subcutaneous QHS [name], MD   50 Units at 11/02/19 2145  . lisinopril (ZESTRIL) tablet 20 mg  20 mg Oral Daily  Jackelyn Poling, NP   20 mg at 11/03/19 3212  . magnesium hydroxide (MILK OF MAGNESIA) suspension 30 mL  30 mL Oral Daily PRN [name], NP      . metoprolol tartrate (LOPRESSOR) tablet 50 mg  50 mg Oral BID [name], MD   50 mg at 11/03/19 0803  . pantoprazole (PROTONIX) EC tablet 40 mg  40 mg Oral Daily [name], NP   40 mg at 11/03/19 0803  . rosuvastatin (CRESTOR) tablet 40 mg  40 mg Oral Daily [name], NP   40 mg at 11/03/19 0803  . sertraline (ZOLOFT) tablet 100 mg  100 mg Oral Daily [name], MD   100 mg at 11/03/19 2482    Lab Results:  Results for orders placed or performed during the hospital encounter of 10/30/19 (from the past 48 hour(s))  Glucose, capillary     Status: Abnormal   Collection Time: 11/01/19 11:49 AM  Result Value Ref Range   Glucose-Capillary 190 (H) 70 - 99 mg/dL   Comment 1 Notify RN    Comment 2 Document in Chart   Glucose, capillary     Status: Abnormal   Collection Time: 11/01/19  4:47 PM  Result Value Ref Range   Glucose-Capillary 282 (H) 70 - 99 mg/dL   Comment 1 Notify RN    Comment 2 Document in Chart   Glucose, capillary     Status: Abnormal   Collection Time: 11/01/19  9:15 PM  Result Value Ref Range   Glucose-Capillary 362 (H) 70 - 99 mg/dL  Glucose, capillary     Status: Abnormal   Collection Time: 11/02/19  5:50 AM  Result Value Ref Range   Glucose-Capillary 233 (H) 70 - 99 mg/dL   Comment 1 Notify RN   Glucose, capillary     Status: None   Collection Time: 11/02/19 11:41 AM  Result Value Ref Range   Glucose-Capillary 97 70 - 99  mg/dL   Comment 1 Notify RN    Comment 2 Document in Chart   Glucose, capillary     Status: Abnormal   Collection Time: 11/02/19  4:51 PM  Result Value Ref Range   Glucose-Capillary 353 (H) 70 - 99 mg/dL   Comment 1 Notify RN    Comment 2 Document in Chart   Glucose, capillary     Status: Abnormal   Collection Time: 11/02/19  8:16 PM  Result Value Ref Range   Glucose-Capillary 416 (H) 70 - 99 mg/dL   Comment 1 Notify RN    Comment 2 Document in Chart   Glucose, capillary     Status: Abnormal   Collection Time: 11/03/19  6:15 AM  Result Value Ref Range   Glucose-Capillary 212 (H) 70 - 99 mg/dL    Blood Alcohol level:  Lab Results  Component Value Date   ETH <10 06/21/2019   ETH <10 06/16/2019    Metabolic Disorder Labs: Lab Results  Component Value Date   HGBA1C 9.4 (H) 10/30/2019   MPG 223.08 10/30/2019   MPG 231.69 06/25/2019   No results found for: PROLACTIN Lab Results  Component Value Date   CHOL 263 (H) 06/16/2019   TRIG 376 (H) 06/16/2019   HDL 37 (L) 06/16/2019   CHOLHDL 7.1 06/16/2019   VLDL 75 (H) 06/16/2019   LDLCALC 151 (H) 06/16/2019   LDLCALC 68 11/20/2018    Physical Findings: AIMS: Facial and Oral Movements Muscles of Facial Expression: None, normal  Lips and Perioral Area: None, normal Jaw: None, normal Tongue: None, normal,Extremity Movements Upper (arms, wrists, hands, fingers): None, normal Lower (legs, knees, ankles, toes): None, normal, Trunk Movements Neck, shoulders, hips: None, normal, Overall Severity Severity of abnormal movements (highest score from questions above): None, normal Incapacitation due to abnormal movements: None, normal Patient's awareness of abnormal movements (rate only patient's report): No Awareness, Dental Status Current problems with teeth and/or dentures?: No Does patient usually wear dentures?: No  CIWA:  CIWA-Ar Total: 1 COWS:  COWS Total Score: 0  Musculoskeletal: Strength & Muscle Tone: within normal  limits Gait & Station: normal Patient leans: N/A  Psychiatric Specialty Exam: Physical Exam  Nursing note and vitals reviewed. Constitutional: He is oriented to person, place, and time. He appears well-developed and well-nourished.  HENT:  Head: Normocephalic and atraumatic.  Respiratory: Effort normal.  Neurological: He is alert and oriented to person, place, and time.    ROS  Blood pressure (!) 145/84, pulse 78, temperature 98 F (36.7 C), temperature source Oral, resp. rate 16, height 5\' 9"  (1.753 m), weight 99.8 kg, SpO2 100 %.Body mass index is 32.49 kg/m.  General Appearance: Casual  Eye Contact:  Fair  Speech:  Normal Rate  Volume:  Normal  Mood:  Anxious  Affect:  Congruent  Thought Process:  Coherent and Descriptions of Associations: Intact  Orientation:  Full (Time, Place, and Person)  Thought Content:  Logical  Suicidal Thoughts:  No  Homicidal Thoughts:  No  Memory:  Immediate;   Fair Recent;   Fair Remote;   Fair  Judgement:  Impaired  Insight:  Lacking  Psychomotor Activity:  Normal  Concentration:  Concentration: Fair and Attention Span: Fair  Recall:  [employer]Fair  Fund of Knowledge:  Good  Language:  Good  Akathisia:  Negative  Handed:  Right  AIMS (if indicated):     Assets:  Desire for Improvement Resilience  ADL's:  Intact  Cognition:  WNL  Sleep:  Number of Hours: 6.25     Treatment Plan Summary: Daily contact with patient to assess and evaluate symptoms and progress in treatment, Medication management and Plan : Patient is seen and examined.  Patient is a 63 year old male with the above-stated past medical and psychiatric history who is seen in follow-up.  Diagnosis: #1 major depression, recurrent, severe without psychotic features, #2 poorly controlled diabetes mellitus, #3 essential hypertension.  Patient is seen in follow-up.  He has limited insight towards multiple things including his housing, his diabetes.  I will increase his Lantus to 50  units subcu twice daily.  I am also increasing his NovoLog to 10 units subcu 3 times daily AC.  He will continue with the sliding scale.  I am stopping the Glucerna supplements.  His body weight and body mass index are above baseline and normal.  I do not think he needs the supplemental shakes.  His blood pressure is still elevated, and I will increase his lisinopril to 40 mg p.o. daily.  We will recheck a renal panel on 11/05/2019 to make sure that his creatinine does not worsen.  He will continue on his metoprolol, Crestor and Protonix.  He will continue on his Zoloft.  That was increased to 100 mg p.o. daily on the fourth.  No other changes to his medications.  The plan will be for him to work on housing over the weekend and hopefully to be able to discharge home on 12/7. 1.  Continue coated aspirin 81 mg p.o. daily  for heart health. 2.  Continue Neurontin 300 mg p.o. twice daily for chronic pain and anxiety. 3.  Continue Norco 1 tablet p.o. every 12 hours as needed pain. 4.  Continue hydroxyzine 25 mg p.o. 3 times daily as needed anxiety. 5.  Continue ibuprofen 600 mg p.o. every 6 hours as needed pain, fever or headache.  This will be used with caution given his ACE inhibitor's. 6.  Continue sliding scale insulin as previously written. 7.  Increase NovoLog insulin to 10 units subcu 3 times daily AC for diabetes mellitus. 8.  Increase Levemir to 50 units subcu twice daily for diabetes mellitus. 9.  Increase lisinopril to 40 mg p.o. daily for hypertension. 10.  Continue metoprolol short acting 50 mg p.o. twice daily for hypertension and heart health. 11.  Continue Protonix 40 mg p.o. daily for gastric protection. 12.  Continue Crestor 40 mg p.o. daily for hyperlipidemia. 13.  Continue Zoloft 100 mg p.o. daily for depression and anxiety. 14.  Basic metabolic panel on 64/4. 15.  Stop Glucerna shakes. 16.  Disposition planning-in progress. Sharma Covert, MD 11/03/2019, 11:22 AM

## 2019-11-03 NOTE — Progress Notes (Signed)
   11/03/19 0000  Psych Admission Type (Psych Patients Only)  Admission Status Voluntary  Psychosocial Assessment  Patient Complaints Anxiety  Eye Contact Fair  Facial Expression Other (Comment) (bright affect)  Affect Appropriate to circumstance  Speech Logical/coherent;Slow  Interaction Assertive  Motor Activity Other (Comment) (appropriate)  Appearance/Hygiene Unremarkable  Behavior Characteristics Cooperative  Mood Depressed;Anxious  Thought Process  Coherency WDL  Content WDL  Delusions None reported or observed  Perception WDL  Hallucination None reported or observed  Judgment Impaired  Confusion None  Danger to Self  Current suicidal ideation? Denies  Danger to Others  Danger to Others None reported or observed  D: Patient in dayroom reports he had a good day.  A: Medications administered as prescribed. Support and encouragement provided as needed.  R: Patient remains safe on the unit. Will continue to monitor for safety and stability.

## 2019-11-04 LAB — GLUCOSE, CAPILLARY
Glucose-Capillary: 148 mg/dL — ABNORMAL HIGH (ref 70–99)
Glucose-Capillary: 170 mg/dL — ABNORMAL HIGH (ref 70–99)
Glucose-Capillary: 220 mg/dL — ABNORMAL HIGH (ref 70–99)
Glucose-Capillary: 288 mg/dL — ABNORMAL HIGH (ref 70–99)

## 2019-11-04 MED ORDER — INSULIN ASPART 100 UNIT/ML ~~LOC~~ SOLN
14.0000 [IU] | Freq: Three times a day (TID) | SUBCUTANEOUS | Status: DC
  Administered 2019-11-04 – 2019-11-06 (×6): 14 [IU] via SUBCUTANEOUS

## 2019-11-04 NOTE — [facility] Group Notes (Signed)
LCSW Adult Therapy Group Note  Date:  11/04/2019  Time:  10:00-11:00AM  Group Topic: Vignettes to Examine Healthy vs Unhealthy Coping Techniques  Participation Level:  Active  Description:    To determine what unhealthy coping techniques typically are used or and what healthy coping techniques would be helpful in coping with various problems. Patients were guided in becoming aware of the differences between healthy and unhealthy coping techniques.  Vignettes were used to generate ideas, which led to a discussion about personal application.     Therapeutic Goals 1. Patient will be able to tell whether a coping skill is healthy or unhealthy. 2. Patient will identify 1-2 healthy and 1-2 unhealthy coping skills they regularly use 3. Patient will discuss vignettes presented to determine unhealthy coping skills the hypothetical person might choose to use, and healthy coping skills that would be more helpful. 4. Patients will support each other.  Summary of Patient Progress:  The patient expressed current healthy coping skills employed include music and hobbies, while current unhealthy coping skills often used are isolating himself and eating things he should not.  Patient participated fully whenever he was present, although he was out seeing a provider for a good portion of group.  Therapeutic Modalities Activity Psychoeducation Processing    [name] Dominion, LCSW 11/04/2019, 1:27 PM

## 2019-11-04 NOTE — Progress Notes (Signed)
[facility]  11/04/2019 1:47 PM [name] [name]  MRN:  [id number]  Subjective: [name] [name] reports, "I have not had any luck with finding any housing. [facility], hoping he can make it happen for me to come back there. I'm taking it one day at a time now". Patient is a 63 year old male with a known past psychiatric history significant for major depression who presented with suicidal ideation and worsening depression on 10/30/2019 and was admitted to the hospital.  Objective: Patient is seen, chart reviewed.  The chart findings discussed with the treatment team. Patient is a 63 year old male with the above-stated past psychiatric history who is seen in follow-up.  He is essentially unchanged.  We had a discussion about becoming more proactive in his care as well as seeking housing.  His motivation for change is limited.  We also discussed his diet.  Progress notes from 2 days ago site that the patient's blood sugar was significantly elevated, and that he had a sweet roll that was half eaten during his interview.  We discussed that as well.  He denied any suicidal ideation, but continues to have a great deal of helplessness, almost to the degree of dependent personality disorder.  His blood pressure is good today at 136/76.  He is afebrile.  He slept 5.0 hours last night.  His blood sugar this morning is 170. He does not appear to be in no apparent distress.  Principal Problem: <principal problem not specified> Diagnosis: Active Problems:   Severe recurrent major depression without psychotic features (HCC)  Total Time spent with patient: 15 minutes  Past Psychiatric History: See admission H&P  Past Medical History:  Past Medical History:  Diagnosis Date  . Anxiety   . Barrett's esophagus   . CAD (coronary artery disease)    a. NSTEMI 05/2014 - occluded RCA dominant proximal s/p asp-thrombectomy/DES to RCA, minimal LAD/LCx, EF 50% by cath, 65-70% by  echo.  . Depression   . Diabetes type 2, uncontrolled (HCC)   . Former tobacco use   . GERD (gastroesophageal reflux disease)   . Hemorrhoids    hx of  . Hypercholesterolemia   . Hypertension   . MI (myocardial infarction) (HCC)   . Peripheral neuropathy     Past Surgical History:  Procedure Laterality Date  . LEFT HEART CATHETERIZATION WITH CORONARY ANGIOGRAM N/A 06/14/2014   Procedure: LEFT HEART CATHETERIZATION WITH CORONARY ANGIOGRAM;  Surgeon: Corky CraftsJayadeep S Varanasi, MD;  [facility];  Service: Cardiovascular;  Laterality: N/A;  . TONSILLECTOMY     Family History:  Family History  Problem Relation Age of Onset  . Hypertension Mother   . Alzheimer's disease Mother   . Alcohol abuse Father   . Cancer Father [id number]       "Throat"  . Diabetes type II Brother   . Cancer Brother 68       throat cancer  . Heart disease Neg Hx    Family Psychiatric  History: See admission H&P Social History:  Social History   Substance and Sexual Activity  Alcohol Use No  . Alcohol/week: 1.0 standard drinks  . Types: 1 Standard drinks or equivalent per week     Social History   Substance and Sexual Activity  Drug Use No    Social History   Socioeconomic History  . Marital status: Single    Spouse name: Not on file  . Number of children: Not on file  .  Years of education: Not on file  . Highest education level: Not on file  Occupational History  . Occupation: unemployed    Comment: applying for disability  Social Needs  . Financial resource strain: Not on file  . Food insecurity    Worry: Not on file    Inability: Not on file  . Transportation needs    Medical: Not on file    Non-medical: Not on file  Tobacco Use  . Smoking status: Former Smoker    Packs/day: 0.25    Years: 3.00    Pack years: 0.75    Quit date: 11/30/1998    Years since quitting: 20.9  . Smokeless tobacco: Never Used  Substance and Sexual Activity  . Alcohol use: No    Alcohol/week: 1.0 standard  drinks    Types: 1 Standard drinks or equivalent per week  . Drug use: No  . Sexual activity: Yes    Birth control/protection: None  Lifestyle  . Physical activity    Days per week: Not on file    Minutes per session: Not on file  . Stress: Not on file  Relationships  . Social Musician on phone: Not on file    Gets together: Not on file    Attends religious service: Not on file    Active member of club or organization: Not on file    Attends meetings of clubs or organizations: Not on file    Relationship status: Not on file  Other Topics Concern  . Not on file  Social History Narrative  . Not on file   Additional Social History:    Pain Medications: see MAR Prescriptions: see MAR Over the Counter: see MAR History of alcohol / drug use?: No history of alcohol / drug abuse Longest period of sobriety (when/how long): unknown Negative Consequences of Use: Financial, Personal relationships, Work / School Withdrawal Symptoms: Other (Comment)(denied withdrawals)  Sleep: Good  Appetite:  Good  Current Medications: Current Facility-Administered Medications  Medication Dose Route Frequency Provider Last Rate Last Dose  . alum & mag hydroxide-simeth (MAALOX/MYLANTA) 200-200-20 MG/5ML suspension 30 mL  30 mL Oral Q4H PRN [name], NP      . aspirin EC tablet 81 mg  81 mg Oral Daily [name], NP   81 mg at 11/04/19 0816  . gabapentin (NEURONTIN) capsule 300 mg  300 mg Oral BID [name], MD   300 mg at 11/04/19 0817  . HYDROcodone-acetaminophen (NORCO/VICODIN) 5-325 MG per tablet 1 tablet  1 tablet Oral Q12H PRN Cobos, [name], MD   1 tablet at 11/04/19 0850  . hydrOXYzine (ATARAX/VISTARIL) tablet 25 mg  25 mg Oral TID PRN [name], NP      . ibuprofen (ADVIL) tablet 600 mg  600 mg Oral Q6H PRN [name], MD   600 mg at 11/03/19 1754  . insulin aspart (novoLOG) injection 0-9 Units  0-9 Units Subcutaneous TID [name], NP   2  Units at 11/04/19 1215  . insulin aspart (novoLOG) injection 14 Units  14 Units Subcutaneous TID WC [name], MD      . insulin detemir (LEVEMIR) injection 50 Units  50 Units Subcutaneous BID [name], MD   50 Units at 11/04/19 [phone]  . lisinopril (ZESTRIL) tablet 40 mg  40 mg Oral Daily [name], MD   40 mg at 11/04/19 0817  . magnesium hydroxide (MILK OF MAGNESIA)  suspension 30 mL  30 mL Oral Daily PRN Nira ConnBerry, Jason A, NP      . metoprolol tartrate (LOPRESSOR) tablet 50 mg  50 mg Oral BID Antonieta Pertlary, Greg Lawson, MD   50 mg at 11/04/19 0817  . pantoprazole (PROTONIX) EC tablet 40 mg  40 mg Oral Daily Nira ConnBerry, Jason A, NP   40 mg at 11/04/19 0817  . rosuvastatin (CRESTOR) tablet 40 mg  40 mg Oral Daily Nira ConnBerry, Jason A, NP   40 mg at 11/04/19 0816  . sertraline (ZOLOFT) tablet 100 mg  100 mg Oral Daily Antonieta Pertlary, Greg Lawson, MD   100 mg at 11/04/19 16100817   Lab Results:  Results for orders placed or performed during the hospital encounter of 10/30/19 (from the past 48 hour(s))  Glucose, capillary     Status: Abnormal   Collection Time: 11/02/19  4:51 PM  Result Value Ref Range   Glucose-Capillary 353 (H) 70 - 99 mg/dL   Comment 1 Notify RN    Comment 2 Document in Chart   Glucose, capillary     Status: Abnormal   Collection Time: 11/02/19  8:16 PM  Result Value Ref Range   Glucose-Capillary 416 (H) 70 - 99 mg/dL   Comment 1 Notify RN    Comment 2 Document in Chart   Glucose, capillary     Status: Abnormal   Collection Time: 11/03/19  6:15 AM  Result Value Ref Range   Glucose-Capillary 212 (H) 70 - 99 mg/dL  Glucose, capillary     Status: Abnormal   Collection Time: 11/03/19 11:45 AM  Result Value Ref Range   Glucose-Capillary 205 (H) 70 - 99 mg/dL   Comment 1 Notify RN    Comment 2 Document in Chart   Glucose, capillary     Status: Abnormal   Collection Time: 11/03/19  5:00 PM  Result Value Ref Range   Glucose-Capillary 201 (H) 70 - 99 mg/dL   Comment 1 Notify RN     Comment 2 Document in Chart   Glucose, capillary     Status: Abnormal   Collection Time: 11/03/19  8:35 PM  Result Value Ref Range   Glucose-Capillary 275 (H) 70 - 99 mg/dL   Comment 1 Notify RN    Comment 2 Document in Chart   Glucose, capillary     Status: Abnormal   Collection Time: 11/04/19  5:59 AM  Result Value Ref Range   Glucose-Capillary 148 (H) 70 - 99 mg/dL   Comment 1 Notify RN    Comment 2 Document in Chart   Glucose, capillary     Status: Abnormal   Collection Time: 11/04/19 12:02 PM  Result Value Ref Range   Glucose-Capillary 170 (H) 70 - 99 mg/dL   Blood Alcohol level:  Lab Results  Component Value Date   ETH <10 06/21/2019   ETH <10 06/16/2019   Metabolic Disorder Labs: Lab Results  Component Value Date   HGBA1C 9.4 (H) 10/30/2019   MPG 223.08 10/30/2019   MPG 231.69 06/25/2019   No results found for: PROLACTIN Lab Results  Component Value Date   CHOL 263 (H) 06/16/2019   TRIG 376 (H) 06/16/2019   HDL 37 (L) 06/16/2019   CHOLHDL 7.1 06/16/2019   VLDL 75 (H) 06/16/2019   LDLCALC 151 (H) 06/16/2019   LDLCALC 68 11/20/2018    Physical Findings: AIMS: Facial and Oral Movements Muscles of Facial Expression: None, normal Lips and Perioral Area: None, normal Jaw: None, normal Tongue: None, normal,Extremity  Movements Upper (arms, wrists, hands, fingers): None, normal Lower (legs, knees, ankles, toes): None, normal, Trunk Movements Neck, shoulders, hips: None, normal, Overall Severity Severity of abnormal movements (highest score from questions above): None, normal Incapacitation due to abnormal movements: None, normal Patient's awareness of abnormal movements (rate only patient's report): No Awareness, Dental Status Current problems with teeth and/or dentures?: No Does patient usually wear dentures?: No  CIWA:  CIWA-Ar Total: 1 COWS:  COWS Total Score: 0  Musculoskeletal: Strength & Muscle Tone: within normal limits Gait & Station:  normal Patient leans: N/A  Psychiatric Specialty Exam: Physical Exam  Nursing note and vitals reviewed. Constitutional: He is oriented to person, place, and time. He appears well-developed and well-nourished.  HENT:  Head: Normocephalic and atraumatic.  Respiratory: Effort normal.  Neurological: He is alert and oriented to person, place, and time.    ROS  Blood pressure 137/76, pulse 79, temperature 98 F (36.7 C), temperature source Oral, resp. rate 20, height 5\' 9"  (1.753 m), weight 99.8 kg, SpO2 97 %.Body mass index is 32.49 kg/m.  General Appearance: Casual  Eye Contact:  Fair  Speech:  Normal Rate  Volume:  Normal  Mood:  Anxious  Affect:  Congruent  Thought Process:  Coherent and Descriptions of Associations: Intact  Orientation:  Full (Time, Place, and Person)  Thought Content:  Logical  Suicidal Thoughts:  No  Homicidal Thoughts:  No  Memory:  Immediate;   Fair Recent;   Fair Remote;   Fair  Judgement:  Impaired  Insight:  Lacking  Psychomotor Activity:  Normal  Concentration:  Concentration: Fair and Attention Span: Fair  Recall:  of Knowledge:  Good  Language:  Good  Akathisia:  Negative  Handed:  Right  AIMS (if indicated):     Assets:  Desire for Improvement Resilience  ADL's:  Intact  Cognition:  WNL  Sleep:  Number of Hours: 5   Treatment Plan Summary: Daily contact with patient to assess and evaluate symptoms and progress in treatment, Medication management and Plan : Patient is seen and examined.  Patient is a 63 year old male with the above-stated past medical and psychiatric history who is seen in follow-up.  Diagnosis: #1 major depression, recurrent, severe without psychotic features, #2 poorly controlled diabetes mellitus, #3 essential hypertension.  Patient is seen in follow-up.  He has limited insight towards multiple things including his housing, his diabetes. Lantus was recently increased to 50 units subcu twice daily & his NovoLog  insulin to10 units subcu 3 times daily AC.  He will continue with the sliding scale.  His body weight and body mass index are above baseline and normal.  I do not think he needs the supplemental shakes.  His blood pressure is still elevated, his lisinopril was recently increased to 40 mg p.o. daily.  We will recheck a renal panel on 11/05/2019 to make sure that his creatinine does not worsen.  He will continue on his metoprolol, Crestor and Protonix.  He will continue on his Zoloft. That was increased to 100 mg p.o. daily on the fourth.  No other changes to his medications.  The plan will be for him to work on housing over the weekend and hopefully to be able to discharge home on 11/06/19.  1.  Continue coated aspirin 81 mg p.o. daily for heart health. 2.  Continue Neurontin 300 mg p.o. twice daily for chronic pain and anxiety. 3.  Continue Norco 1 tablet p.o. every 12 hours as needed  pain. 4.  Continue hydroxyzine 25 mg p.o. 3 times daily as needed anxiety. 5.  Continue ibuprofen 600 mg p.o. every 6 hours as needed pain, fever or headache.  This will be used with caution given his ACE inhibitor's. 6.  Continue sliding scale insulin as previously written. 7.  Continue NovoLog insulin10 units subcu 3 times daily AC for diabetes mellitus. 8.  Continue Levemir to 50 units subcu twice daily for diabetes mellitus. 9.  Continue lisinopril to 40 mg p.o. daily for hypertension. 10. Continue metoprolol short acting 50 mg p.o. twice daily for hypertension and heart health. 11.  Continue Protonix 40 mg p.o. daily for gastric protection. 12.  Continue Crestor 40 mg p.o. daily for hyperlipidemia. 13.  Continue Zoloft 100 mg p.o. daily for depression and anxiety. 14.  Basic metabolic panel on 22/9. 15.  Stop Glucerna shakes. 16.  Disposition planning-in progress. Lindell Spar, NP 11/04/2019, 1:47 PMPatient ID: Nigel Berthold, male   DOB: 05/27/1956, 63 y.o.   MRN: [id number]

## 2019-11-04 NOTE — Progress Notes (Signed)
D.  Pt appears guarded on approach, speaks very softly.  Pt denies complaints at this time other than chronic hip pain.  Pt was positive for evening wrap up group, minimally engaged with peers on the unit.  Pt denies SI/HI/AVH at this time.  A.  Support and encouragement offered, medication given as ordered.  R. Pt remains safe on the unit, will continue to monitor.

## 2019-11-04 NOTE — Progress Notes (Signed)
   11/04/19 0900  Psych Admission Type (Psych Patients Only)  Admission Status Voluntary  Psychosocial Assessment  Patient Complaints Anxiety  Eye Contact Fair  Facial Expression Flat  Affect Appropriate to circumstance  Speech Logical/coherent  Interaction Assertive  Motor Activity Slow  Appearance/Hygiene Unremarkable  Behavior Characteristics Cooperative  Mood Depressed;Anxious  Thought Process  Coherency WDL  Content WDL  Delusions None reported or observed  Perception WDL  Hallucination None reported or observed  Judgment WDL  Confusion None  Danger to Self  Current suicidal ideation? Denies

## 2019-11-05 LAB — BASIC METABOLIC PANEL
Anion gap: 12 (ref 5–15)
BUN: 18 mg/dL (ref 8–23)
CO2: 23 mmol/L (ref 22–32)
Calcium: 9 mg/dL (ref 8.9–10.3)
Chloride: 104 mmol/L (ref 98–111)
Creatinine, Ser: 0.87 mg/dL (ref 0.61–1.24)
GFR calc Af Amer: >60 mL/min (ref >60)
GFR calc non Af Amer: >60 mL/min (ref >60)
Glucose, Bld: 215 mg/dL — ABNORMAL HIGH (ref 70–99)
Potassium: 3.9 mmol/L (ref 3.5–5.1)
Sodium: 139 mmol/L (ref 135–145)

## 2019-11-05 LAB — GLUCOSE, CAPILLARY
Glucose-Capillary: 193 mg/dL — ABNORMAL HIGH (ref 70–99)
Glucose-Capillary: 178 mg/dL — ABNORMAL HIGH (ref 70–99)
Glucose-Capillary: 151 mg/dL — ABNORMAL HIGH (ref 70–99)
Glucose-Capillary: 169 mg/dL — ABNORMAL HIGH (ref 70–99)

## 2019-11-05 MED ORDER — INSULIN DETEMIR 100 UNIT/ML ~~LOC~~ SOLN: 50.0000 [IU] | Freq: Two times a day (BID) | SUBCUTANEOUS | 0 refills | Status: DC

## 2019-11-05 MED ORDER — PANTOPRAZOLE SODIUM 40 MG PO TBEC: 40.0000 mg | DELAYED_RELEASE_TABLET | Freq: Every day | ORAL | 0 refills | Status: AC

## 2019-11-05 MED ORDER — LISINOPRIL 40 MG PO TABS: 40.0000 mg | ORAL_TABLET | Freq: Every day | ORAL | 0 refills | Status: AC

## 2019-11-05 MED ORDER — SERTRALINE HCL 100 MG PO TABS: 100.0000 mg | ORAL_TABLET | Freq: Every day | ORAL | 0 refills | Status: DC

## 2019-11-05 MED ORDER — ASPIRIN 81 MG PO TBEC: 81.0000 mg | DELAYED_RELEASE_TABLET | Freq: Every day | ORAL | 12 refills | Status: AC

## 2019-11-05 MED ORDER — INSULIN ASPART 100 UNIT/ML ~~LOC~~ SOLN: 14.0000 [IU] | Freq: Three times a day (TID) | SUBCUTANEOUS | 0 refills | Status: DC

## 2019-11-05 MED ORDER — IBUPROFEN 600 MG PO TABS: 600.0000 mg | ORAL_TABLET | Freq: Four times a day (QID) | ORAL | 0 refills | Status: DC | PRN

## 2019-11-05 MED ORDER — GABAPENTIN 300 MG PO CAPS: 300.0000 mg | ORAL_CAPSULE | Freq: Two times a day (BID) | ORAL | 0 refills | Status: AC

## 2019-11-05 MED ORDER — INSULIN ASPART 100 UNIT/ML ~~LOC~~ SOLN: 0.0000 [IU] | Freq: Three times a day (TID) | SUBCUTANEOUS | 0 refills | Status: DC

## 2019-11-05 MED ORDER — HYDROCODONE-ACETAMINOPHEN 5-325 MG PO TABS: 1.0000 | ORAL_TABLET | Freq: Two times a day (BID) | ORAL | 0 refills | Status: AC | PRN

## 2019-11-05 MED ORDER — HYDROXYZINE HCL 25 MG PO TABS: 25.0000 mg | ORAL_TABLET | Freq: Three times a day (TID) | ORAL | 0 refills | Status: DC | PRN

## 2019-11-05 MED ORDER — ROSUVASTATIN CALCIUM 40 MG PO TABS: 40.0000 mg | ORAL_TABLET | Freq: Every day | ORAL | 0 refills | Status: AC

## 2019-11-05 MED ORDER — METOPROLOL TARTRATE 50 MG PO TABS: 50.0000 mg | ORAL_TABLET | Freq: Two times a day (BID) | ORAL | 0 refills | Status: AC

## 2019-11-05 NOTE — [facility] Group Notes (Signed)
[facility] LCSW Group Therapy Note  Date/Time:  11/05/2019 9:00-10:00 or 10:00-11:00AM  Type of Therapy and Topic:  Group Therapy:  Healthy and Unhealthy Supports  Participation Level:  Active   Description of Group:  Patients in this group were introduced to the idea of adding a variety of healthy supports to address the various needs in their lives.Patients discussed what additional healthy supports could be helpful in their recovery and wellness after discharge in order to prevent future hospitalizations.   An emphasis was placed on using counselor, doctor, therapy groups, 12-step groups, and problem-specific support groups to expand supports.  Several songs were played to emphasize points made throughout group.  Therapeutic Goals:   1)  discuss importance of adding supports to stay well once out of the hospital  2)  compare healthy versus unhealthy supports and identify some examples of each  3)  generate ideas and descriptions of healthy supports that can be added  4)  offer mutual support about how to address unhealthy supports  5)  encourage active participation in and adherence to discharge plan    Summary of Patient Progress:  The patient was out of the group home for a good portion of group, with a provider.  He stated that current healthy support in his life is his wife to whom he shows his real self, while current unhealthy supports include himself, as he is not willing to show other family members his authentic self.  The patient expressed a willingness to add a therapist and psychiatrist to hold him accountable and act as support(s) to help in his recovery journey.   Therapeutic Modalities:   Motivational Interviewing Brief Solution-Focused Therapy  Selmer Dominion, LCSW

## 2019-11-05 NOTE — Progress Notes (Signed)
D:  Patient's self inventory sheet, patient has poor sleep, no sleep medication.  Fair appetite, low energy level, poor concentration.  Rated depression 4, hopeless 3, anxiety 2.  Denied withdrawals.  Denied SI.  Physical problems, pain, headaches.  Physical pain, back and hips.  Pain medicine helpful.  Goal is work on homelessness.  Plans to make calls.  Does have discharge plans. A:  Medications administered per MD orders.  Emotional support and encouragement given patient. R:  Denied SI and HI, contracts for safety.  Denied A/V hallucinations.  Safety maintained with 15 minute checks.

## 2019-11-05 NOTE — [facility] Counselor (Signed)
Clinical Social Work Note  At patient's request, met with him to look at therapy options.  He eventually stated he is not sure if he will be staying in a hotel locally, going to [place] [facility] the rescue [facility] or moving back to [place] with his spouse.  As a result, CSW taught him how to do searches for a therapist on-line so that he can find his own when he makes a decision.  He expressed comfort with doing this moving forward.  Selmer Dominion, LCSW 11/05/2019, 3:28 PM

## 2019-11-05 NOTE — [facility] Suicide Risk Assessment (Signed)
[facility] Discharge Suicide Risk Assessment   Principal Problem: <principal problem not specified> Discharge Diagnoses: Active Problems:   Severe recurrent major depression without psychotic features ([facility])   Total Time spent with patient: 15 minutes  Musculoskeletal: Strength & Muscle Tone: within normal limits Gait & Station: normal Patient leans: N/A  Psychiatric Specialty Exam: Review of Systems  All other systems reviewed and are negative.   Blood pressure 131/81, pulse 80, temperature 98 F (36.7 C), temperature source Oral, resp. rate 20, height 5\' 9"  (1.753 m), weight 99.8 kg, SpO2 97 %.Body mass index is 32.49 kg/m.  General Appearance: Casual  Eye Contact::  Fair  Speech:  Normal Rate409  Volume:  Normal  Mood:  Anxious  Affect:  Congruent  Thought Process:  Coherent and Descriptions of Associations: Intact  Orientation:  Full (Time, Place, and Person)  Thought Content:  Logical  Suicidal Thoughts:  No  Homicidal Thoughts:  No  Memory:  Immediate;   Fair Recent;   Fair Remote;   Fair  Judgement:  Intact  Insight:  Fair  Psychomotor Activity:  Normal  Concentration:  Fair  Recall:  [employer] of Knowledge:Good  Language: Good  Akathisia:  Negative  Handed:  Right  AIMS (if indicated):     Assets:  Desire for Improvement Resilience  Sleep:  Number of Hours: 6.25  Cognition: WNL  ADL's:  Intact   Mental Status Per Nursing Assessment::   On Admission:  NA  Demographic Factors:  Male, Divorced or widowed, Low socioeconomic status, Living alone and Unemployed  Loss Factors: Financial problems/change in socioeconomic status  Historical Factors: Impulsivity  Risk Reduction Factors:   Sense of responsibility to family  Continued Clinical Symptoms:  Depression:   Impulsivity Previous Psychiatric Diagnoses and Treatments Medical Diagnoses and Treatments/Surgeries  Cognitive Features That Contribute To Risk:  Thought constriction (tunnel vision)     Suicide Risk:  Minimal: No identifiable suicidal ideation.  Patients presenting with no risk factors but with morbid ruminations; may be classified as minimal risk based on the severity of the depressive symptoms    Plan Of Care/Follow-up recommendations:  Activity:  ad lib  Sharma Covert, MD 11/05/2019, 11:53 AM

## 2019-11-05 NOTE — Progress Notes (Signed)
[name] MD Progress Note  11/05/2019 12:21 PM [name] [name]  MRN:  [id number]  Subjective: [name] reports, "I'm doing okay today. I'm just wondering how tomorrow will play out. I'm scheduled to be discharged tomorrow morning. I did receive a missed call from the [facility] yesterday. I will call him back tomorrow morning. I just need a base where I can stay to start piecing things together".  Objective: Patient is seen, chart reviewed.  The chart findings discussed with the treatment team. Patient is a 63 year old male with the above-stated past psychiatric history who is seen in follow-up.  He is essentially unchanged.  We had a discussion about becoming more proactive in his care as well as seeking housing.  His motivation for change is limited.  We also discussed his diet.  Progress notes from 3 days ago site that the patient's blood sugar was significantly elevated, and that he had a sweet roll that was half eaten during his interview.  We discussed that as well.  He denied any suicidal ideation, but continues to have a great deal of feeling of helplessness, almost to the degree of dependent personality disorder.  His blood pressure remains stable today at 131/81.  He is afebrile.  He slept 6.25 hours last night.  His blood sugar this morning is 178 this morning. He does not appear to be in no apparent distress. Patient is aware that he will be discharged in tomorrow morning. He is preparing himself mentally for that. He currently denies any SIHI, AVH, delusional thoughts or paranoia. Dicky is in agreement to continue his current plan of care as already in progress.  Principal Problem: Severe recurrent major depression without psychotic features ([place])  Diagnosis: Principal Problem:   Severe recurrent major depression without psychotic features ([place])  Total Time spent with patient: 15 minutes  Past Psychiatric History: See admission H&P  Past Medical History:  Past Medical  History:  Diagnosis Date  . Anxiety   . Barrett's esophagus   . CAD (coronary artery disease)    a. NSTEMI 05/2014 - occluded RCA dominant proximal s/p asp-thrombectomy/DES to RCA, minimal LAD/LCx, EF 50% by cath, 65-70% by echo.  . Depression   . Diabetes type 2, uncontrolled ([place])   . Former tobacco use   . GERD (gastroesophageal reflux disease)   . Hemorrhoids    hx of  . Hypercholesterolemia   . Hypertension   . MI (myocardial infarction) ([place])   . Peripheral neuropathy     Past Surgical History:  Procedure Laterality Date  . LEFT HEART CATHETERIZATION WITH CORONARY ANGIOGRAM N/A 06/14/2014   Procedure: LEFT HEART CATHETERIZATION WITH CORONARY ANGIOGRAM;  Surgeon: Jettie Booze, MD;  Location: [facility] CATH LAB;  Service: Cardiovascular;  Laterality: N/A;  . TONSILLECTOMY     Family History:  Family History  Problem Relation Age of Onset  . Hypertension Mother   . Alzheimer's disease Mother   . Alcohol abuse Father   . Cancer Father 88       "Throat"  . Diabetes type II Brother   . Cancer Brother 68       throat cancer  . Heart disease Neg Hx    Family Psychiatric  History: See admission H&P Social History:  Social History   Substance and Sexual Activity  Alcohol Use No  . Alcohol/week: 1.0 standard drinks  . Types: 1 Standard drinks or equivalent per week     Social History   Substance and Sexual Activity  Drug Use No    Social History   Socioeconomic History  . Marital status: Single    Spouse name: Not on file  . Number of children: Not on file  . Years of education: Not on file  . Highest education level: Not on file  Occupational History  . Occupation: unemployed    Comment: applying for disability  Social Needs  . Financial resource strain: Not on file  . Food insecurity    Worry: Not on file    Inability: Not on file  . Transportation needs    Medical: Not on file    Non-medical: Not on file  Tobacco Use  . Smoking status: Former Smoker     Packs/day: 0.25    Years: 3.00    Pack years: 0.75    Quit date: 11/30/1998    Years since quitting: 20.9  . Smokeless tobacco: Never Used  Substance and Sexual Activity  . Alcohol use: No    Alcohol/week: 1.0 standard drinks    Types: 1 Standard drinks or equivalent per week  . Drug use: No  . Sexual activity: Yes    Birth control/protection: None  Lifestyle  . Physical activity    Days per week: Not on file    Minutes per session: Not on file  . Stress: Not on file  Relationships  . Social Musicianconnections    Talks on phone: Not on file    Gets together: Not on file    Attends religious service: Not on file    Active member of club or organization: Not on file    Attends meetings of clubs or organizations: Not on file    Relationship status: Not on file  Other Topics Concern  . Not on file  Social History Narrative  . Not on file   Additional Social History:    Pain Medications: see MAR Prescriptions: see MAR Over the Counter: see MAR History of alcohol / drug use?: No history of alcohol / drug abuse Longest period of sobriety (when/how long): unknown Negative Consequences of Use: Financial, Personal relationships, Work / School Withdrawal Symptoms: Other (Comment)(denied withdrawals)  Sleep: Good  Appetite:  Good  Current Medications: Current Facility-Administered Medications  Medication Dose Route Frequency Provider Last Rate Last Dose  . alum & mag hydroxide-simeth (MAALOX/MYLANTA) 200-200-20 MG/5ML suspension 30 mL  30 mL Oral Q4H PRN Nira ConnBerry, Jason A, NP      . aspirin EC tablet 81 mg  81 mg Oral Daily Nira ConnBerry, Jason A, NP   81 mg at 11/05/19 0844  . gabapentin (NEURONTIN) capsule 300 mg  300 mg Oral BID Antonieta Pertlary, Greg Lawson, MD   300 mg at 11/05/19 0844  . HYDROcodone-acetaminophen (NORCO/VICODIN) 5-325 MG per tablet 1 tablet  1 tablet Oral Q12H PRN Cobos, Rockey SituFernando A, MD   1 tablet at 11/05/19 0903  . hydrOXYzine (ATARAX/VISTARIL) tablet 25 mg  25 mg Oral TID PRN  Nira ConnBerry, Jason A, NP      . ibuprofen (ADVIL) tablet 600 mg  600 mg Oral Q6H PRN Antonieta Pertlary, Greg Lawson, MD   600 mg at 11/03/19 1754  . insulin aspart (novoLOG) injection 0-9 Units  0-9 Units Subcutaneous TID WC Nira ConnBerry, Jason A, NP   2 Units at 11/05/19 1157  . insulin aspart (novoLOG) injection 14 Units  14 Units Subcutaneous TID WC Antonieta Pertlary, Greg Lawson, MD   14 Units at 11/05/19 1157  . insulin detemir (LEVEMIR) injection 50 Units  50 Units Subcutaneous BID Antonieta Pertlary, Greg Lawson,  MD   50 Units at 11/05/19 0844  . lisinopril (ZESTRIL) tablet 40 mg  40 mg Oral Daily Antonieta Pertlary, Greg Lawson, MD   40 mg at 11/05/19 0844  . magnesium hydroxide (MILK OF MAGNESIA) suspension 30 mL  30 mL Oral Daily PRN Nira ConnBerry, Jason A, NP      . metoprolol tartrate (LOPRESSOR) tablet 50 mg  50 mg Oral BID Antonieta Pertlary, Greg Lawson, MD   50 mg at 11/05/19 0845  . pantoprazole (PROTONIX) EC tablet 40 mg  40 mg Oral Daily Nira ConnBerry, Jason A, NP   40 mg at 11/05/19 0845  . rosuvastatin (CRESTOR) tablet 40 mg  40 mg Oral Daily Nira ConnBerry, Jason A, NP   40 mg at 11/05/19 0845  . sertraline (ZOLOFT) tablet 100 mg  100 mg Oral Daily Antonieta Pertlary, Greg Lawson, MD   100 mg at 11/05/19 0845   Lab Results:  Results for orders placed or performed during the hospital encounter of 10/30/19 (from the past 48 hour(s))  Glucose, capillary     Status: Abnormal   Collection Time: 11/03/19  5:00 PM  Result Value Ref Range   Glucose-Capillary 201 (H) 70 - 99 mg/dL   Comment 1 Notify RN    Comment 2 Document in Chart   Glucose, capillary     Status: Abnormal   Collection Time: 11/03/19  8:35 PM  Result Value Ref Range   Glucose-Capillary 275 (H) 70 - 99 mg/dL   Comment 1 Notify RN    Comment 2 Document in Chart   Glucose, capillary     Status: Abnormal   Collection Time: 11/04/19  5:59 AM  Result Value Ref Range   Glucose-Capillary 148 (H) 70 - 99 mg/dL   Comment 1 Notify RN    Comment 2 Document in Chart   Glucose, capillary     Status: Abnormal   Collection Time:  11/04/19 12:02 PM  Result Value Ref Range   Glucose-Capillary 170 (H) 70 - 99 mg/dL  Glucose, capillary     Status: Abnormal   Collection Time: 11/04/19  5:00 PM  Result Value Ref Range   Glucose-Capillary 220 (H) 70 - 99 mg/dL  Glucose, capillary     Status: Abnormal   Collection Time: 11/04/19 11:04 PM  Result Value Ref Range   Glucose-Capillary 288 (H) 70 - 99 mg/dL  Glucose, capillary     Status: Abnormal   Collection Time: 11/05/19  5:50 AM  Result Value Ref Range   Glucose-Capillary 193 (H) 70 - 99 mg/dL   Comment 1 Notify RN    Comment 2 Document in Chart   Basic metabolic panel     Status: Abnormal   Collection Time: 11/05/19  6:25 AM  Result Value Ref Range   Sodium 139 135 - 145 mmol/L   Potassium 3.9 3.5 - 5.1 mmol/L   Chloride 104 98 - 111 mmol/L   CO2 23 22 - 32 mmol/L   Glucose, Bld 215 (H) 70 - 99 mg/dL   BUN 18 8 - 23 mg/dL   Creatinine, Ser 1.610.87 0.61 - 1.24 mg/dL   Calcium 9.0 8.9 - 09.610.3 mg/dL   GFR calc non Af Amer >60 >60 mL/min   GFR calc Af Amer >60 >60 mL/min   Anion gap 12 5 - 15    Comment: Performed at [facility], [address] [place], [place] [id number]  Glucose, capillary     Status: Abnormal   Collection Time: 11/05/19 11:16 AM  Result Value  Ref Range   Glucose-Capillary 178 (H) 70 - 99 mg/dL   Blood Alcohol level:  Lab Results  Component Value Date   ETH <10 06/21/2019   ETH <10 06/16/2019   Metabolic Disorder Labs: Lab Results  Component Value Date   HGBA1C 9.4 (H) 10/30/2019   MPG 223.08 10/30/2019   MPG 231.69 06/25/2019   No results found for: PROLACTIN Lab Results  Component Value Date   CHOL 263 (H) 06/16/2019   TRIG 376 (H) 06/16/2019   HDL 37 (L) 06/16/2019   CHOLHDL 7.1 06/16/2019   VLDL 75 (H) 06/16/2019   LDLCALC 151 (H) 06/16/2019   LDLCALC 68 11/20/2018    Physical Findings: AIMS: Facial and Oral Movements Muscles of Facial Expression: None, normal Lips and Perioral Area: None,  normal Jaw: None, normal Tongue: None, normal,Extremity Movements Upper (arms, wrists, hands, fingers): None, normal Lower (legs, knees, ankles, toes): None, normal, Trunk Movements Neck, shoulders, hips: None, normal, Overall Severity Severity of abnormal movements (highest score from questions above): None, normal Incapacitation due to abnormal movements: None, normal Patient's awareness of abnormal movements (rate only patient's report): No Awareness, Dental Status Current problems with teeth and/or dentures?: No Does patient usually wear dentures?: No  CIWA:  CIWA-Ar Total: 1 COWS:  COWS Total Score: 0  Musculoskeletal: Strength & Muscle Tone: within normal limits Gait & Station: normal Patient leans: N/A  Psychiatric Specialty Exam: Physical Exam  Nursing note and vitals reviewed. Constitutional: He is oriented to person, place, and time. He appears well-developed and well-nourished.  HENT:  Head: Normocephalic and atraumatic.  Respiratory: Effort normal.  Neurological: He is alert and oriented to person, place, and time.  Skin: Skin is warm.    Review of Systems  Constitutional: Negative for chills and fever.  Respiratory: Negative for cough, shortness of breath and wheezing.   Cardiovascular: Negative for chest pain and palpitations.       Blood pressure: 131/81  Pulse: 80  Gastrointestinal: Negative for abdominal pain, heartburn, nausea and vomiting.  Musculoskeletal: Negative for myalgias.  Skin: Negative.   Neurological: Negative for dizziness and headaches.  Psychiatric/Behavioral: Positive for depression ("Improving") and substance abuse (Hx. THC use disorder). Negative for hallucinations, memory loss and suicidal ideas. The patient is not nervous/anxious (Stable) and does not have insomnia.     Blood pressure 131/81, pulse 80, temperature 98 F (36.7 C), temperature source Oral, resp. rate 20, height 5\' 9"  (1.753 m), weight 99.8 kg, SpO2 97 %.Body mass index is  32.49 kg/m.  General Appearance: Casual  Eye Contact:  Fair  Speech:  Normal Rate  Volume:  Normal  Mood:  Anxious  Affect:  Congruent  Thought Process:  Coherent and Descriptions of Associations: Intact  Orientation:  Full (Time, Place, and Person)  Thought Content:  Logical  Suicidal Thoughts:  No  Homicidal Thoughts:  No  Memory:  Immediate;   Fair Recent;   Fair Remote;   Fair  Judgement:  Intact  Insight:  Lacking  Psychomotor Activity:  Normal  Concentration:  Concentration: Fair and Attention Span: Fair  Recall:  of Knowledge:  Good  Language:  Good  Akathisia:  Negative  Handed:  Right  AIMS (if indicated):     Assets:  Desire for Improvement Resilience  ADL's:  Intact  Cognition:  WNL  Sleep:  Number of Hours: 6.25   Treatment Plan Summary: Daily contact with patient to assess and evaluate symptoms and progress in treatment, Medication management and Plan :  Patient is seen and examined.  Patient is a 62 year old male with the above-stated past medical and psychiatric history who is seen in follow-up.  Diagnosis: #1 major depression, recurrent, severe without psychotic features, #2 poorly controlled diabetes mellitus, #3 essential hypertension.  Patient is seen in follow-up.  He remains with limited insight towards multiple things including his housing, his diabetes. Lantus was recently increased to 50 units subcu twice daily & his NovoLog insulin to10 units subcu 3 times daily AC.  He will continue with the sliding scale.  His body weight and body mass index are above baseline and normal. His blood pressure stabilizing with the recent adjustment of his lisinopril dose to 40 mg p.o. daily.  Reviewed a renal panel result f 11/05/2019. his creatinine is within norm at 0.87.  He will continue on his metoprolol, Crestor and Protonix.  He will continue on his Zoloft. That was increased to 100 mg p.o. daily on the fourth.  No other changes to his medications.  The  plan will be for him to work on housing over the weekend and hopefully to be able to discharge home on 11/06/19.  1.  Continue coated aspirin 81 mg p.o. daily for heart health. 2.  Continue Neurontin 300 mg p.o. twice daily for chronic pain and anxiety. 3.  Continue Norco 1 tablet p.o. every 12 hours as needed pain. 4.  Continue hydroxyzine 25 mg p.o. 3 times daily as needed anxiety. 5.  Continue ibuprofen 600 mg p.o. every 6 hours as needed pain, fever or headache.  This will be used with caution given his ACE inhibitor's. 6.  Continue sliding scale insulin as previously written. 7.  Continue NovoLog insulin10 units subcu 3 times daily AC for diabetes mellitus. 8.  Continue Levemir to 50 units subcu twice daily for diabetes mellitus. 9.  Continue lisinopril to 40 mg p.o. daily for hypertension. 10. Continue metoprolol short acting 50 mg p.o. twice daily for hypertension and heart health. 11.  Continue Protonix 40 mg p.o. daily for gastric protection. 12.  Continue Crestor 40 mg p.o. daily for hyperlipidemia. 13.  Continue Zoloft 100 mg p.o. daily for depression and anxiety. 14.  Basic metabolic panel on 12/6. 15.  Stop Glucerna shakes. 16.  Disposition planning-in progress.  Armandina Stammer, NP 11/05/2019, 12:21 PMPatient ID: [name], male   DOB: 1956/09/07, 63 y.o.   MRN: [id number] Patient ID: [name] WHITUS, male   DOB: 1956-03-28, 63 y.o.   MRN: [id number]

## 2019-11-05 NOTE — Progress Notes (Signed)
[facility] Notes:  (Nursing/MHT/Case Management/Adjunct)  Date:  11/05/2019  Time:  2030  Type of Therapy:  Wrap up group  Participation Level:  Active  Participation Quality:  Attentive, Resistant and Supportive  Affect:  Depressed  Cognitive:  Appropriate  Insight:  Improving  Engagement in Group:  Supportive  Modes of Intervention:  Clarification, Education and Support  Summary of Progress/Problems: Positive thinking and positive change were discussed.   Winfield Rast S 11/05/2019, 10:07 PM

## 2019-11-06 LAB — GLUCOSE, CAPILLARY
Glucose-Capillary: 209 mg/dL — ABNORMAL HIGH (ref 70–99)
Glucose-Capillary: 208 mg/dL — ABNORMAL HIGH (ref 70–99)

## 2019-11-06 NOTE — Tx Team (Signed)
Interdisciplinary Treatment and Diagnostic Plan Update  11/06/2019 Time of Session:  [name] [name] MRN: [id number]  Principal Diagnosis: Severe recurrent major depression without psychotic features [facility])  Secondary Diagnoses: Principal Problem:   Severe recurrent major depression without psychotic features (HCC)   Current Medications:  Current Facility-Administered Medications  Medication Dose Route Frequency Provider Last Rate Last Dose  . alum & mag hydroxide-simeth (MAALOX/MYLANTA) 200-200-20 MG/5ML suspension 30 mL  30 mL Oral Q4H PRN [name], NP      . aspirin EC tablet 81 mg  81 mg Oral Daily [name], NP   81 mg at 11/06/19 0826  . gabapentin (NEURONTIN) capsule 300 mg  300 mg Oral BID [name], MD   300 mg at 11/06/19 0826  . HYDROcodone-acetaminophen (NORCO/VICODIN) 5-325 MG per tablet 1 tablet  1 tablet Oral Q12H PRN Cobos, [name], MD   1 tablet at 11/06/19 0834  . hydrOXYzine (ATARAX/VISTARIL) tablet 25 mg  25 mg Oral TID PRN [name], NP      . ibuprofen (ADVIL) tablet 600 mg  600 mg Oral Q6H PRN [name], MD   600 mg at 11/05/19 1623  . insulin aspart (novoLOG) injection 0-9 Units  0-9 Units Subcutaneous TID [name], NP   3 Units at 11/06/19 [phone]  . insulin aspart (novoLOG) injection 14 Units  14 Units Subcutaneous TID WC [name], MD   14 Units at 11/06/19 [phone]  . insulin detemir (LEVEMIR) injection 50 Units  50 Units Subcutaneous BID [name], MD   50 Units at 11/06/19 0831  . lisinopril (ZESTRIL) tablet 40 mg  40 mg Oral Daily [name], MD   40 mg at 11/06/19 0826  . magnesium hydroxide (MILK OF MAGNESIA) suspension 30 mL  30 mL Oral Daily PRN [name], NP      . metoprolol tartrate (LOPRESSOR) tablet 50 mg  50 mg Oral BID [name], MD   50 mg at 11/06/19 0826  . pantoprazole (PROTONIX) EC tablet 40 mg  40 mg Oral Daily [name], NP   40 mg at 11/06/19 0826  .  rosuvastatin (CRESTOR) tablet 40 mg  40 mg Oral Daily [name], NP   40 mg at 11/06/19 0826  . sertraline (ZOLOFT) tablet 100 mg  100 mg Oral Daily [name], MD   100 mg at 11/06/19 3716   PTA Medications: Medications Prior to Admission  Medication Sig Dispense Refill Last Dose  . HYDROcodone-acetaminophen (NORCO) 7.5-325 MG tablet Take 1 tablet by mouth 4 (four) times daily as needed.     Marland Kitchen LEVEMIR FLEXTOUCH 100 UNIT/ML Pen Inject 40 Units into the skin 2 (two) times daily.     Marland Kitchen gabapentin (NEURONTIN) 300 MG capsule Take 1 capsule (300 mg total) by mouth 2 (two) times daily. For pain/neuropathy 60 capsule 0   . lisinopril (ZESTRIL) 20 MG tablet Take 1 tablet (20 mg total) by mouth daily. For high blood pressure 10 tablet 0   . metoprolol tartrate (LOPRESSOR) 25 MG tablet Take 1 tablet (25 mg total) by mouth 2 (two) times daily. 60 tablet 0   . nitroGLYCERIN (NITROSTAT) 0.4 MG SL tablet Place 1 tablet (0.4 mg total) under the tongue every 5 (five) minutes as needed for chest pain (up to 3 doses). 25 tablet 0   . rosuvastatin (CRESTOR) 40 MG tablet TAKE 1 TABLET(40 MG) BY MOUTH DAILY (Patient taking  differently: Take 40 mg by mouth daily. ) 90 tablet 0   . [DISCONTINUED] aspirin (EQ ASPIRIN ADULT LOW DOSE) 81 MG EC tablet Take 1 tablet (81 mg total) by mouth daily. Swallow whole: For heart health 30 tablet 12   . [DISCONTINUED] insulin aspart (NOVOLOG) 100 UNIT/ML injection Inject 0-15 Units into the skin 3 (three) times daily before meals. 121-150=2 units, 151-200=4 units, 201-250=7 units, 251-300=9 units, 301-350=12 units, >351=15 units     . [DISCONTINUED] pantoprazole (PROTONIX) 40 MG tablet Take 1 tablet (40 mg total) by mouth daily. 90 tablet 0     Patient Stressors:    Patient Strengths:    Treatment Modalities: Medication Management, Group therapy, Case management,  1 to 1 session with clinician, Psychoeducation, Recreational therapy.   Physician Treatment Plan for  Primary Diagnosis: Severe recurrent major depression without psychotic features ([place]) Long Term Goal(s): Improvement in symptoms so as ready for discharge Improvement in symptoms so as ready for discharge   Short Term Goals: Ability to identify changes in lifestyle to reduce recurrence of condition will improve Ability to verbalize feelings will improve Ability to disclose and discuss suicidal ideas Ability to demonstrate self-control will improve Ability to identify and develop effective coping behaviors will improve Ability to maintain clinical measurements within normal limits will improve Ability to identify changes in lifestyle to reduce recurrence of condition will improve Ability to verbalize feelings will improve Ability to disclose and discuss suicidal ideas Ability to demonstrate self-control will improve Ability to identify and develop effective coping behaviors will improve Ability to maintain clinical measurements within normal limits will improve  Medication Management: Evaluate patient's response, side effects, and tolerance of medication regimen.  Therapeutic Interventions: 1 to 1 sessions, Unit Group sessions and Medication administration.  Evaluation of Outcomes: Adequate for Discharge  Physician Treatment Plan for Secondary Diagnosis: Principal Problem:   Severe recurrent major depression without psychotic features ([place])  Long Term Goal(s): Improvement in symptoms so as ready for discharge Improvement in symptoms so as ready for discharge   Short Term Goals: Ability to identify changes in lifestyle to reduce recurrence of condition will improve Ability to verbalize feelings will improve Ability to disclose and discuss suicidal ideas Ability to demonstrate self-control will improve Ability to identify and develop effective coping behaviors will improve Ability to maintain clinical measurements within normal limits will improve Ability to identify changes in  lifestyle to reduce recurrence of condition will improve Ability to verbalize feelings will improve Ability to disclose and discuss suicidal ideas Ability to demonstrate self-control will improve Ability to identify and develop effective coping behaviors will improve Ability to maintain clinical measurements within normal limits will improve     Medication Management: Evaluate patient's response, side effects, and tolerance of medication regimen.  Therapeutic Interventions: 1 to 1 sessions, Unit Group sessions and Medication administration.  Evaluation of Outcomes: Adequate for Discharge   RN Treatment Plan for Primary Diagnosis: Severe recurrent major depression without psychotic features ([place]) Long Term Goal(s): Knowledge of disease and therapeutic regimen to maintain health will improve  Short Term Goals: Ability to participate in decision making will improve, Ability to verbalize feelings will improve, Ability to identify and develop effective coping behaviors will improve and Compliance with prescribed medications will improve  Medication Management: RN will administer medications as ordered by provider, will assess and evaluate patient's response and provide education to patient for prescribed medication. RN will report any adverse and/or side effects to prescribing provider.  Therapeutic Interventions: 1 on  1 counseling sessions, Psychoeducation, Medication administration, Evaluate responses to treatment, Monitor vital signs and CBGs as ordered, Perform/monitor CIWA, COWS, AIMS and Fall Risk screenings as ordered, Perform wound care treatments as ordered.  Evaluation of Outcomes: Adequate for Discharge   LCSW Treatment Plan for Primary Diagnosis: Severe recurrent major depression without psychotic features (HCC) Long Term Goal(s): Safe transition to appropriate next level of care at discharge, Engage patient in therapeutic group addressing interpersonal concerns.  Short Term  Goals: Engage patient in aftercare planning with referrals and resources  Therapeutic Interventions: Assess for all discharge needs, 1 to 1 time with Social worker, Explore available resources and support systems, Assess for adequacy in community support network, Educate family and significant other(s) on suicide prevention, Complete Psychosocial Assessment, Interpersonal group therapy.  Evaluation of Outcomes: Adequate for Discharge   Progress in Treatment: Attending groups: Yes. Participating in groups: Yes. Taking medication as prescribed: Yes. Toleration medication: Yes. Family/Significant other contact made: Yes, individual(s) contacted:  the patient's wife Patient understands diagnosis: Yes. Discussing patient identified problems/goals with staff: Yes. Medical problems stabilized or resolved: Yes. Denies suicidal/homicidal ideation: Yes. Issues/concerns per patient self-inventory: No. Other:   New problem(s) identified: None   New Short Term/Long Term Goal(s):  medication stabilization, elimination of SI thoughts, development of comprehensive mental wellness plan.    Patient Goals:    Discharge Plan or Barriers: Patient plans to discharge to a local hotel, until 11/13/19 when his bed at [facility] becomes available. Alontae will follow up with [facility] for outpatient medication management and therapy services.   Reason for Continuation of Hospitalization:  Estimated Length of Stay: Discharge, 11/06/2019 Attendees: Patient:  11/06/2019 10:50 AM  Physician: Dr. Landry Mellow, MD; Dr. Nehemiah Massed, MD 11/06/2019 10:50 AM  Nursing: Ethelene Browns. A, RN 11/06/2019 10:50 AM  RN Care Manager: 11/06/2019 10:50 AM  Social Worker: Baldo Daub, LCSW 11/06/2019 10:50 AM  Recreational Therapist:  11/06/2019 10:50 AM  Other: Marciano Sequin, NP 11/06/2019 10:50 AM  Other:  11/06/2019 10:50 AM  Other: 11/06/2019 10:50 AM    Scribe for Treatment Team: Maeola Sarah,  LCSWA 11/06/2019 10:50 AM

## 2019-11-06 NOTE — Progress Notes (Signed)
Patient ID: [name] [name], male   DOB: 04-23-1956, 63 y.o.   MRN: [id number]   [place] NOVEL CORONAVIRUS (COVID-19) DAILY CHECK-OFF SYMPTOMS - answer yes or no to each - every day NO YES  Have you had a fever in the past 24 hours?  . Fever (Temp > 37.80C / 100F) X   Have you had any of these symptoms in the past 24 hours? . New Cough .  Sore Throat  .  Shortness of Breath .  Difficulty Breathing .  Unexplained Body Aches   X   Have you had any one of these symptoms in the past 24 hours not related to allergies?   . Runny Nose .  Nasal Congestion .  Sneezing   X   If you have had runny nose, nasal congestion, sneezing in the past 24 hours, has it worsened?  X   EXPOSURES - check yes or no X   Have you traveled outside the state in the past 14 days?  X   Have you been in contact with someone with a confirmed diagnosis of COVID-19 or PUI in the past 14 days without wearing appropriate PPE?  X   Have you been living in the same home as a person with confirmed diagnosis of COVID-19 or a PUI (household contact)?    X   Have you been diagnosed with COVID-19?    X              What to do next: Answered NO to all: Answered YES to anything:   Proceed with unit schedule Follow the [facility] Inpatient Flowsheet.

## 2019-11-06 NOTE — Progress Notes (Signed)
Patient ID: [name] [name], male   DOB: Oct 27, 1956, 63 y.o.   MRN: [id number]   D: Pt alert and oriented on the unit.   A: Education, support, and encouragement provided. Discharge summary, medications and follow up appointments reviewed with pt. Suicide prevention resources provided, including "My 3 App." Pt's belongings in lockers # 30 and 46 returned and belongings sheet signed.  R: Pt denies SI/HI, A/VH, pain, or any concerns at this time. Pt ambulatory on and off unit. Pt discharged to lobby.

## 2019-11-06 NOTE — Discharge Summary (Signed)
Physician Discharge Summary Note  Patient:  [name] [name] is an 63 y.o., male MRN:  [id number] DOB:  [birth year] Patient phone:  [phone] (home)  Patient address:   [address] [place] [address],  Total Time spent with patient: 15 minutes  Date of Admission:  10/30/2019 Date of Discharge: 11/06/19  Reason for Admission:  suicidal ideation  Principal Problem: Severe recurrent major depression without psychotic features [facility]) Discharge Diagnoses: Principal Problem:   Severe recurrent major depression without psychotic features [facility])   Past Psychiatric History: patient is known to [facility] from prior psychiatric admissions, most recently in July 2020. At the time was admitted was admitted twice ( within a few days of each other) for depression, suicidal ideations. Was diagnosed with MDD. A the time was discharged on Cymbalta, Neurontin, Ambien. Reports past history of suicide attempt earlier this year by overdosing . Denies history of psychosis. Denies history of mania or hypomania  Past Medical History:  Past Medical History:  Diagnosis Date  . Anxiety   . Barrett's esophagus   . CAD (coronary artery disease)    a. NSTEMI 05/2014 - occluded RCA dominant proximal s/p asp-thrombectomy/DES to RCA, minimal LAD/LCx, EF 50% by cath, 65-70% by echo.  . Depression   . Diabetes type 2, uncontrolled (HCC)   . Former tobacco use   . GERD (gastroesophageal reflux disease)   . Hemorrhoids    hx of  . Hypercholesterolemia   . Hypertension   . MI (myocardial infarction) (HCC)   . Peripheral neuropathy     Past Surgical History:  Procedure Laterality Date  . LEFT HEART CATHETERIZATION WITH CORONARY ANGIOGRAM N/A 06/14/2014   Procedure: LEFT HEART CATHETERIZATION WITH CORONARY ANGIOGRAM;  Surgeon: Corky Crafts, MD;  Location: [facility] CATH LAB;  Service: Cardiovascular;  Laterality: N/A;  . TONSILLECTOMY     Family History:  Family History  Problem Relation Age of Onset  .  Hypertension Mother   . Alzheimer's disease Mother   . Alcohol abuse Father   . Cancer Father 77       "Throat"  . Diabetes type II Brother   . Cancer Brother 68       throat cancer  . Heart disease Neg Hx    Family Psychiatric  History: father was alcoholic, denies other history of mental illness in family. No suicides in family Social History:  Social History   Substance and Sexual Activity  Alcohol Use No  . Alcohol/week: 1.0 standard drinks  . Types: 1 Standard drinks or equivalent per week     Social History   Substance and Sexual Activity  Drug Use No    Social History   Socioeconomic History  . Marital status: Single    Spouse name: Not on file  . Number of children: Not on file  . Years of education: Not on file  . Highest education level: Not on file  Occupational History  . Occupation: unemployed    Comment: applying for disability  Social Needs  . Financial resource strain: Not on file  . Food insecurity    Worry: Not on file    Inability: Not on file  . Transportation needs    Medical: Not on file    Non-medical: Not on file  Tobacco Use  . Smoking status: Former Smoker    Packs/day: 0.25    Years: 3.00    Pack years: 0.75    Quit date: 11/30/1998    Years since quitting: 20.9  .  Smokeless tobacco: Never Used  Substance and Sexual Activity  . Alcohol use: No    Alcohol/week: 1.0 standard drinks    Types: 1 Standard drinks or equivalent per week  . Drug use: No  . Sexual activity: Yes    Birth control/protection: None  Lifestyle  . Physical activity    Days per week: Not on file    Minutes per session: Not on file  . Stress: Not on file  Relationships  . Social Herbalist on phone: Not on file    Gets together: Not on file    Attends religious service: Not on file    Active member of club or organization: Not on file    Attends meetings of clubs or organizations: Not on file    Relationship status: Not on file  Other Topics  Concern  . Not on file  Social History Narrative  . Not on file    Hospital Course:  From admission H&P: 63 year old male. Presented to [facility] voluntarily reporting worsening depression, suicidal ideations, which he describes as passive, without specific plan or intent.  Reports he has been feeling more depressed over the last several days to week. He attributes depression in part to family separation ( reports wife /children relocated to [place] several months ago, has been homeless, most recently had been staying at [employer]).Endorses worsening neuro-vegetative symptoms as below.Denies psychotic symptoms.  He reports he has been off antidepressant medications x several weeks.   [name] [name] was admitted for reports of suicidal ideation. He had been staying at the [facility] but lost his bed there as a result of visiting with his family over Thanksgiving. He presented to the hospital reporting suicidal ideation after becoming homeless. He remained on the [facility] unit for eight days. He was started on Zoloft, Neurontin, and PRN Vistaril. Medications for diabetes management were restarted. He participated in group therapy on the unit. He requested help with housing and has been accepted to the [facility] for the 14th. He reports he has money to stay in a hotel room until the 14th but may also stay with family. He responded well to treatment with no adverse effects reported. He has shown improved mood, affect, sleep, and interaction. He denies any SI/HI/AVH and contracts for safety. He is discharging on the medications listed below. He agrees to follow up at [facility] (see below). Patient is provided with prescriptions for medications upon discharge.   Physical Findings: AIMS: Facial and Oral Movements Muscles of Facial Expression: None, normal Lips and Perioral Area: None, normal Jaw: None, normal Tongue: None, normal,Extremity Movements Upper  (arms, wrists, hands, fingers): None, normal Lower (legs, knees, ankles, toes): None, normal, Trunk Movements Neck, shoulders, hips: None, normal, Overall Severity Severity of abnormal movements (highest score from questions above): None, normal Incapacitation due to abnormal movements: None, normal Patient's awareness of abnormal movements (rate only patient's report): No Awareness, Dental Status Current problems with teeth and/or dentures?: No Does patient usually wear dentures?: No  CIWA:  CIWA-Ar Total: 1 COWS:  COWS Total Score: 1  Musculoskeletal: Strength & Muscle Tone: within normal limits Gait & Station: normal Patient leans: N/A  Psychiatric Specialty Exam: Physical Exam  Nursing note and vitals reviewed. Constitutional: He is oriented to person, place, and time. He appears well-developed and well-nourished.  Cardiovascular: Normal rate.  Respiratory: Effort normal.  Neurological: He is alert and oriented to person, place, and time.  Review of Systems  Constitutional: Negative.   Respiratory: Negative for cough and shortness of breath.   Cardiovascular: Negative for chest pain.  Psychiatric/Behavioral: Positive for depression (stable on medication). Negative for hallucinations, substance abuse and suicidal ideas. The patient is not nervous/anxious and does not have insomnia.     Blood pressure 133/79, pulse 84, temperature 98.1 F (36.7 C), temperature source Oral, resp. rate 18, height 5\' 9"  (1.753 m), weight 99.8 kg, SpO2 98 %.Body mass index is 32.49 kg/m.  See MD's discharge SRA    Have you used any form of tobacco in the last 30 days? (Cigarettes, Smokeless Tobacco, Cigars, and/or Pipes): No  Has this patient used any form of tobacco in the last 30 days? (Cigarettes, Smokeless Tobacco, Cigars, and/or Pipes)  No  Blood Alcohol level:  Lab Results  Component Value Date   ETH <10 06/21/2019   ETH <10 06/16/2019    Metabolic Disorder Labs:  Lab Results   Component Value Date   HGBA1C 9.4 (H) 10/30/2019   MPG 223.08 10/30/2019   MPG 231.69 06/25/2019   No results found for: PROLACTIN Lab Results  Component Value Date   CHOL 263 (H) 06/16/2019   TRIG 376 (H) 06/16/2019   HDL 37 (L) 06/16/2019   CHOLHDL 7.1 06/16/2019   VLDL 75 (H) 06/16/2019   LDLCALC 151 (H) 06/16/2019   LDLCALC 68 11/20/2018    See Psychiatric Specialty Exam and Suicide Risk Assessment completed by Attending Physician prior to discharge.  Discharge destination:  Home  Is patient on multiple antipsychotic therapies at discharge:  No   Has Patient had three or more failed trials of antipsychotic monotherapy by history:  No  Recommended Plan for Multiple Antipsychotic Therapies: NA   Allergies as of 11/06/2019   No Known Allergies     Medication List    STOP taking these medications   HYDROcodone-acetaminophen 7.5-325 MG tablet Commonly known as: NORCO Replaced by: HYDROcodone-acetaminophen 5-325 MG tablet   Levemir FlexTouch 100 UNIT/ML Pen Generic drug: Insulin Detemir Replaced by: insulin detemir 100 UNIT/ML injection     TAKE these medications     Indication  aspirin 81 MG EC tablet Commonly known as: EQ Aspirin Adult Low Dose Take 1 tablet (81 mg total) by mouth daily. Swallow whole: For heart health  Indication: Antiplatelet   gabapentin 300 MG capsule Commonly known as: NEURONTIN Take 1 capsule (300 mg total) by mouth 2 (two) times daily. For agitation/neuropathic pain What changed:   medication strength  how much to take  additional instructions  Another medication with the same name was removed. Continue taking this medication, and follow the directions you see here.  Indication: Neuropathic Pain, Agitation   HYDROcodone-acetaminophen 5-325 MG tablet Commonly known as: NORCO/VICODIN Take 1 tablet by mouth every 12 (twelve) hours as needed for moderate pain. Replaces: HYDROcodone-acetaminophen 7.5-325 MG tablet  Indication:  Pain   hydrOXYzine 25 MG tablet Commonly known as: ATARAX/VISTARIL Take 1 tablet (25 mg total) by mouth 3 (three) times daily as needed for anxiety.  Indication: Feeling Anxious   ibuprofen 600 MG tablet Commonly known as: ADVIL Take 1 tablet (600 mg total) by mouth every 6 (six) hours as needed for fever, headache or mild pain. (May buy from over the counter): For pain  Indication: Fever, Pain   insulin aspart 100 UNIT/ML injection Commonly known as: novoLOG Inject 0-15 Units into the skin 3 (three) times daily before meals. 121-150=2 units, 151-200=4 units, 201-250=7 units, 251-300=9 units, 301-350=12 units, >  351=15 units: Sliding scale coverage What changed: additional instructions  Indication: Type 2 Diabetes   insulin aspart 100 UNIT/ML injection Commonly known as: novoLOG Inject 14 Units into the skin 3 (three) times daily with meals. For diabetes management What changed: You were already taking a medication with the same name, and this prescription was added. Make sure you understand how and when to take each.  Indication: Type 2 Diabetes   insulin detemir 100 UNIT/ML injection Commonly known as: LEVEMIR Inject 0.5 mLs (50 Units total) into the skin 2 (two) times daily. For diabetes management Replaces: Levemir FlexTouch 100 UNIT/ML Pen  Indication: Type 2 Diabetes   lisinopril 40 MG tablet Commonly known as: ZESTRIL Take 1 tablet (40 mg total) by mouth daily. For high blood pressure What changed:   medication strength  how much to take  Indication: High Blood Pressure Disorder   metoprolol tartrate 50 MG tablet Commonly known as: LOPRESSOR Take 1 tablet (50 mg total) by mouth 2 (two) times daily. For high blood pressure What changed:   medication strength  how much to take  additional instructions  Indication: High Blood Pressure Disorder   nitroGLYCERIN 0.4 MG SL tablet Commonly known as: NITROSTAT Place 1 tablet (0.4 mg total) under the tongue every 5  (five) minutes as needed for chest pain (up to 3 doses).  Indication: Acute Angina Pectoris   pantoprazole 40 MG tablet Commonly known as: PROTONIX Take 1 tablet (40 mg total) by mouth daily. For acid reflux What changed: additional instructions  Indication: Gastroesophageal Reflux Disease   rosuvastatin 40 MG tablet Commonly known as: CRESTOR Take 1 tablet (40 mg total) by mouth daily. For high cholesterol What changed: See the new instructions.  Indication: Inherited Homozygous Hypercholesterolemia, High Amount of Fats in the Blood   sertraline 100 MG tablet Commonly known as: ZOLOFT Take 1 tablet (100 mg total) by mouth daily. For depression  Indication: Major Depressive Disorder      Follow-up Information    [facility] Follow up.   Contact information: [address] [place] [id number] [phone]        [facility] Follow up.   Why: Primary care clinic for low-income. Please call to schedule appointment for follow-up diabetes management. Contact information: [address] [place] [phone]          Follow-up recommendations: Activity as tolerated. Diet as recommended by primary care physician. Keep all scheduled follow-up appointments as recommended.   Comments:   Patient is instructed to take all prescribed medications as recommended. Report any side effects or adverse reactions to your outpatient psychiatrist. Patient is instructed to abstain from alcohol and illegal drugs while on prescription medications. In the event of worsening symptoms, patient is instructed to call the crisis hotline, 911, or go to the nearest emergency department for evaluation and treatment.  Signed: Aldean BakerJanet E Shakila Mak, NP 11/06/2019, 10:05 AM

## 2019-11-06 NOTE — Progress Notes (Signed)
Recreation Therapy Notes  Date:  12.7.20 Time: 0930 Location: [address] Dayroom  Group Topic: Stress Management  Goal Area(s) Addresses:  Patient will identify positive stress management techniques. Patient will identify benefits of using stress management post d/c.  Behavioral Response:  Engaged  Intervention: Stress Management  Activity :  Guided Imagery.  LRT read [name] script that guided patients on [name]  Patients were to listen and follow along as LRT read script to engage in activity.    Education:  Stress Management, Discharge Planning.   Education Outcome: Acknowledges Education  Clinical Observations/Feedback: Pt attended and participated in activity.     [name] [name], LRT/CTRS         [name] [name] 11/06/2019 12:06 PM 

## 2019-12-16 ENCOUNTER — Emergency Department (HOSPITAL_COMMUNITY)
Admission: EM | Admit: 2019-12-16 | Discharge: 2019-12-17 | Disposition: A | Discharge status: Other Acute Inpatient Hospital | Payer: Medicare HMO | Attending: Emergency Medicine | Admitting: Emergency Medicine

## 2019-12-16 ENCOUNTER — Encounter (HOSPITAL_COMMUNITY): Payer: Self-pay | Admitting: *Deleted

## 2019-12-16 ENCOUNTER — Other Ambulatory Visit: Payer: Self-pay

## 2019-12-16 DIAGNOSIS — F332 Major depressive disorder, recurrent severe without psychotic features: Secondary | ICD-10-CM | POA: Diagnosis not present

## 2019-12-16 DIAGNOSIS — R442 Other hallucinations: Secondary | ICD-10-CM | POA: Diagnosis not present

## 2019-12-16 DIAGNOSIS — I119 Hypertensive heart disease without heart failure: Secondary | ICD-10-CM | POA: Diagnosis not present

## 2019-12-16 DIAGNOSIS — E1140 Type 2 diabetes mellitus with diabetic neuropathy, unspecified: Secondary | ICD-10-CM | POA: Insufficient documentation

## 2019-12-16 DIAGNOSIS — E1151 Type 2 diabetes mellitus with diabetic peripheral angiopathy without gangrene: Secondary | ICD-10-CM | POA: Insufficient documentation

## 2019-12-16 DIAGNOSIS — R45851 Suicidal ideations: Secondary | ICD-10-CM | POA: Insufficient documentation

## 2019-12-16 DIAGNOSIS — Z794 Long term (current) use of insulin: Secondary | ICD-10-CM | POA: Insufficient documentation

## 2019-12-16 DIAGNOSIS — I2510 Atherosclerotic heart disease of native coronary artery without angina pectoris: Secondary | ICD-10-CM | POA: Insufficient documentation

## 2019-12-16 DIAGNOSIS — Z20822 Contact with and (suspected) exposure to covid-19: Secondary | ICD-10-CM | POA: Diagnosis not present

## 2019-12-16 DIAGNOSIS — F329 Major depressive disorder, single episode, unspecified: Secondary | ICD-10-CM | POA: Diagnosis present

## 2019-12-16 LAB — CBC WITH DIFFERENTIAL/PLATELET
Abs Immature Granulocytes: 0.02 10*3/uL (ref 0.00–0.07)
Basophils Absolute: 0.1 10*3/uL (ref 0.0–0.1)
Basophils Relative: 1 %
Eosinophils Absolute: 0.2 10*3/uL (ref 0.0–0.5)
Eosinophils Relative: 4 %
HCT: 43.6 % (ref 39.0–52.0)
Hemoglobin: 13.9 g/dL (ref 13.0–17.0)
Immature Granulocytes: 0 %
Lymphocytes Relative: 34 %
Lymphs Abs: 1.9 10*3/uL (ref 0.7–4.0)
MCH: 27.6 pg (ref 26.0–34.0)
MCHC: 31.9 g/dL (ref 30.0–36.0)
MCV: 86.7 fL (ref 80.0–100.0)
Monocytes Absolute: 0.4 10*3/uL (ref 0.1–1.0)
Monocytes Relative: 7 %
Neutro Abs: 2.9 10*3/uL (ref 1.7–7.7)
Neutrophils Relative %: 54 %
Platelets: 315 10*3/uL (ref 150–400)
RBC: 5.03 MIL/uL (ref 4.22–5.81)
RDW: 14.5 % (ref 11.5–15.5)
WBC: 5.4 10*3/uL (ref 4.0–10.5)
nRBC: 0 % (ref 0.0–0.2)

## 2019-12-16 LAB — COMPREHENSIVE METABOLIC PANEL
ALT: 17 U/L (ref 0–44)
AST: 24 U/L (ref 15–41)
Albumin: 4.2 g/dL (ref 3.5–5.0)
Alkaline Phosphatase: 99 U/L (ref 38–126)
Anion gap: 11 (ref 5–15)
BUN: 10 mg/dL (ref 8–23)
CO2: 25 mmol/L (ref 22–32)
Calcium: 9.1 mg/dL (ref 8.9–10.3)
Chloride: 102 mmol/L (ref 98–111)
Creatinine, Ser: 0.96 mg/dL (ref 0.61–1.24)
GFR calc Af Amer: >60 mL/min (ref >60)
GFR calc non Af Amer: >60 mL/min (ref >60)
Glucose, Bld: 113 mg/dL — ABNORMAL HIGH (ref 70–99)
Potassium: 4.1 mmol/L (ref 3.5–5.1)
Sodium: 138 mmol/L (ref 135–145)
Total Bilirubin: 0.7 mg/dL (ref 0.3–1.2)
Total Protein: 7 g/dL (ref 6.5–8.1)

## 2019-12-16 LAB — ACETAMINOPHEN LEVEL: Acetaminophen (Tylenol), Serum: 10 ug/mL (ref 10–30)

## 2019-12-16 LAB — RESPIRATORY PANEL BY RT PCR (FLU A&B, COVID)
Influenza A by PCR: NEGATIVE
Influenza B by PCR: NEGATIVE
SARS Coronavirus 2 by RT PCR: NEGATIVE

## 2019-12-16 LAB — SALICYLATE LEVEL: Salicylate Lvl: <7 mg/dL — ABNORMAL LOW (ref 7.0–30.0)

## 2019-12-16 LAB — TROPONIN I (HIGH SENSITIVITY): Troponin I (High Sensitivity): 4 ng/L (ref <18)

## 2019-12-16 LAB — ETHANOL: Alcohol, Ethyl (B): <10 mg/dL (ref <10)

## 2019-12-16 LAB — CBG MONITORING, ED: Glucose-Capillary: 379 mg/dL — ABNORMAL HIGH (ref 70–99)

## 2019-12-16 MED ORDER — HYDROXYZINE HCL 25 MG PO TABS: 25.0000 mg | ORAL_TABLET | Freq: Three times a day (TID) | ORAL | Status: DC | PRN

## 2019-12-16 MED ORDER — PANTOPRAZOLE SODIUM 40 MG PO TBEC
40.0000 mg | DELAYED_RELEASE_TABLET | Freq: Every day | ORAL | Status: DC
  Administered 2019-12-17: 09:00:00 40 mg via ORAL
  Filled 2019-12-16: qty 1

## 2019-12-16 MED ORDER — SERTRALINE HCL 100 MG PO TABS
100.0000 mg | ORAL_TABLET | Freq: Every day | ORAL | Status: DC
  Administered 2019-12-17: 100 mg via ORAL
  Filled 2019-12-16 (×2): qty 1

## 2019-12-16 MED ORDER — NITROGLYCERIN 0.4 MG SL SUBL: 0.4000 mg | SUBLINGUAL_TABLET | SUBLINGUAL | Status: DC | PRN

## 2019-12-16 MED ORDER — ASPIRIN EC 81 MG PO TBEC
81.0000 mg | DELAYED_RELEASE_TABLET | Freq: Every day | ORAL | Status: DC
  Administered 2019-12-17: 81 mg via ORAL
  Filled 2019-12-16: qty 1

## 2019-12-16 MED ORDER — LISINOPRIL 20 MG PO TABS
40.0000 mg | ORAL_TABLET | Freq: Every day | ORAL | Status: DC
  Administered 2019-12-17: 09:00:00 40 mg via ORAL
  Filled 2019-12-16 (×2): qty 2

## 2019-12-16 MED ORDER — INSULIN ASPART 100 UNIT/ML ~~LOC~~ SOLN
0.0000 [IU] | Freq: Three times a day (TID) | SUBCUTANEOUS | Status: DC
  Administered 2019-12-17: 9 [IU] via SUBCUTANEOUS

## 2019-12-16 MED ORDER — INSULIN DETEMIR 100 UNIT/ML ~~LOC~~ SOLN
50.0000 [IU] | Freq: Two times a day (BID) | SUBCUTANEOUS | Status: DC
  Administered 2019-12-17 (×2): 50 [IU] via SUBCUTANEOUS
  Filled 2019-12-16 (×3): qty 0.5

## 2019-12-16 MED ORDER — GABAPENTIN 300 MG PO CAPS
300.0000 mg | ORAL_CAPSULE | Freq: Two times a day (BID) | ORAL | Status: DC
  Administered 2019-12-17 (×2): 300 mg via ORAL
  Filled 2019-12-16 (×2): qty 1

## 2019-12-16 MED ORDER — ROSUVASTATIN CALCIUM 20 MG PO TABS
40.0000 mg | ORAL_TABLET | Freq: Every day | ORAL | Status: DC
  Administered 2019-12-17: 40 mg via ORAL
  Filled 2019-12-16: qty 2

## 2019-12-16 MED ORDER — INSULIN ASPART 100 UNIT/ML ~~LOC~~ SOLN
14.0000 [IU] | Freq: Three times a day (TID) | SUBCUTANEOUS | Status: DC
  Administered 2019-12-17: 09:00:00 14 [IU] via SUBCUTANEOUS

## 2019-12-16 MED ORDER — METOPROLOL TARTRATE 25 MG PO TABS
50.0000 mg | ORAL_TABLET | Freq: Two times a day (BID) | ORAL | Status: DC
  Administered 2019-12-17 (×2): 50 mg via ORAL
  Filled 2019-12-16 (×2): qty 2

## 2019-12-16 MED ORDER — HYDROCODONE-ACETAMINOPHEN 5-325 MG PO TABS
1.0000 | ORAL_TABLET | Freq: Two times a day (BID) | ORAL | Status: DC | PRN
  Administered 2019-12-17: 02:00:00 1 via ORAL
  Filled 2019-12-16: qty 1

## 2019-12-16 MED ORDER — IBUPROFEN 400 MG PO TABS
600.0000 mg | ORAL_TABLET | Freq: Four times a day (QID) | ORAL | Status: DC | PRN
  Administered 2019-12-17: 09:00:00 600 mg via ORAL
  Filled 2019-12-16: qty 1

## 2019-12-16 NOTE — BH Assessment (Signed)
Faxed clinical information to the following facilities for placement:   [facility]      [facility] [place] [place]  [facility]       [facility]       [facility]       [facility]       [facility]   [facility]      [facility]       [facility]    [address] Patsy [place], [facility] Triage Specialist [phone]

## 2019-12-16 NOTE — ED Provider Notes (Signed)
[facility] EMERGENCY DEPARTMENT Provider Note   CSN: [id number] Arrival date & time: 12/16/19  1615     History Chief Complaint  Patient presents with  . Psychiatric Evaluation    [name] [name] is a 64 y.o. male.  HPI   This patient is a 64 year old male, he has a known history of coronary disease, history of diabetes, hypertension as well as peripheral neuropathy which gives him chronic pain.  He has a history of depression and has been admitted to the behavioral health hospital multiple times in the last year because of suicidal thoughts attempts and ideation.  He was most recently admitted in July and then again in November, both times discharged with a contract for safety feeling better however he reports that over the last week he has had increasing and mounting depression.  He relates this to circumstances regarding money, life and stating "I am sick of it all and want to end it".  He reports that he made his way to [facility] today, told him that he was suicidal and told him that he wanted to jump off a bridge or walk into traffic.  He reports that he knows where the train goes and was thinking about walking in front of the train.  He denies actually acting on these impulses today but went for help instead.  He does endorse some types of hallucinations stating that he sees shadows around him, he becomes a little paranoid about that but is not hearing voices or seeing people.  Medically the patient states that he has been having his usual chronic pain but did have some chest pain earlier today for which she took a nitroglycerin which she has at home.  He denies using his psychiatric medications stating that they are too expensive that he never got them filled after being discharged and reports "I do not think they help me anyway".  Past Medical History:  Diagnosis Date  . Anxiety   . Barrett's esophagus   . CAD (coronary artery disease)    a. NSTEMI 05/2014 -  occluded RCA dominant proximal s/p asp-thrombectomy/DES to RCA, minimal LAD/LCx, EF 50% by cath, 65-70% by echo.  . Depression   . Diabetes type 2, uncontrolled (HCC)   . Former tobacco use   . GERD (gastroesophageal reflux disease)   . Hemorrhoids    hx of  . Hypercholesterolemia   . Hypertension   . MI (myocardial infarction) (HCC)   . Peripheral neuropathy     Patient Active Problem List   Diagnosis Date Noted  . Severe recurrent major depression without psychotic features (HCC) 10/30/2019  . MDD (major depressive disorder), recurrent episode, severe (HCC) 06/24/2019  . Deliberate medication overdose, initial encounter (HCC) 06/22/2019  . Hypoglycemia 06/21/2019  . MDD (major depressive disorder), recurrent severe, without psychosis (HCC) 06/15/2019  . Cerebral thrombosis with cerebral infarction 01/15/2019  . Subarachnoid hemorrhage 01/15/2019  . Stroke-like symptoms 01/14/2019  . Hypoglycemia due to insulin 01/14/2019  . Noncompliance with medications   . Homeless   . DKA (diabetic ketoacidosis) (HCC) 11/27/2018  . Chest pain of uncertain etiology 11/19/2018  . Dizziness 11/19/2018  . Bilateral low back pain without sciatica 04/12/2018  . Vitamin D deficiency 04/07/2018  . Pill dysphagia 04/07/2018  . Enlarged thyroid gland 04/07/2018  . Labile blood glucose 04/07/2018  . Brittle diabetes mellitus (HCC) 04/07/2018  . AKI (acute kidney injury) (HCC) 09/22/2017  . Chronic pain syndrome 08/18/2016  . Diabetic ketoacidosis (HCC) 04/12/2016  .  Hip pain, bilateral 08/23/2015  . Chronic neck pain 08/23/2015  . DKA (diabetic ketoacidoses) ([place]) 05/30/2015  . Acute renal failure ([place]) 05/30/2015  . Restless legs 08/31/2014  . Uncontrolled diabetes mellitus type 2 with peripheral artery disease ([place]) 08/10/2014  . Old myocardial infarction 08/10/2014  . CAD (coronary artery disease) 06/18/2014  . Hyperlipidemia 06/18/2014  . NSTEMI (non-ST elevated myocardial infarction)  ([place]) 06/14/2014  . N&V (nausea and vomiting) 07/21/2013  . Chronic pain in left shoulder 04/06/2013  . Peripheral neuropathy 02/28/2012  . Depression 12/25/2011  . GERD (gastroesophageal reflux disease) 12/25/2011  . DM (diabetes mellitus), type 2, uncontrolled ([place]) 11/18/2011    Past Surgical History:  Procedure Laterality Date  . LEFT HEART CATHETERIZATION WITH CORONARY ANGIOGRAM N/A 06/14/2014   Procedure: LEFT HEART CATHETERIZATION WITH CORONARY ANGIOGRAM;  Surgeon: Jettie Booze, MD;  Location: [facility] CATH LAB;  Service: Cardiovascular;  Laterality: N/A;  . TONSILLECTOMY         Family History  Problem Relation Age of Onset  . Hypertension Mother   . Alzheimer's disease Mother   . Alcohol abuse Father   . Cancer Father 33       "Throat"  . Diabetes type II Brother   . Cancer Brother 68       throat cancer  . Heart disease Neg Hx     Social History   Tobacco Use  . Smoking status: Former Smoker    Packs/day: 0.25    Years: 3.00    Pack years: 0.75    Quit date: 11/30/1998    Years since quitting: 21.0  . Smokeless tobacco: Never Used  Substance Use Topics  . Alcohol use: No    Alcohol/week: 1.0 standard drinks    Types: 1 Standard drinks or equivalent per week  . Drug use: No    Home Medications Prior to Admission medications   Medication Sig Start Date End Date Taking? Authorizing Provider  aspirin (EQ ASPIRIN ADULT LOW DOSE) 81 MG EC tablet Take 1 tablet (81 mg total) by mouth daily. Swallow whole: For heart health 11/05/19  Yes Lindell Spar I, NP  gabapentin (NEURONTIN) 300 MG capsule Take 1 capsule (300 mg total) by mouth 2 (two) times daily. For agitation/neuropathic pain 11/05/19  Yes Lindell Spar I, NP  HYDROcodone-acetaminophen (NORCO/VICODIN) 5-325 MG tablet Take 1 tablet by mouth every 12 (twelve) hours as needed for moderate pain. 11/05/19  Yes Lindell Spar I, NP  hydrocortisone cream 1 % Apply 1 application topically 2 (two) times daily as needed  for itching.   Yes [provider]  hydrOXYzine (ATARAX/VISTARIL) 25 MG tablet Take 1 tablet (25 mg total) by mouth 3 (three) times daily as needed for anxiety. 11/05/19  Yes Lindell Spar I, NP  ibuprofen (ADVIL) 600 MG tablet Take 1 tablet (600 mg total) by mouth every 6 (six) hours as needed for fever, headache or mild pain. (May buy from over the counter): For pain 11/05/19  Yes Nwoko, Herbert Pun I, NP  insulin aspart (NOVOLOG) 100 UNIT/ML injection Inject 0-15 Units into the skin 3 (three) times daily before meals. 121-150=2 units, 151-200=4 units, 201-250=7 units, 251-300=9 units, 301-350=12 units, >351=15 units: Sliding scale coverage 11/05/19  Yes Nwoko, Agnes I, NP  insulin aspart (NOVOLOG) 100 UNIT/ML injection Inject 14 Units into the skin 3 (three) times daily with meals. For diabetes management Patient taking differently: Inject 20 Units into the skin 3 (three) times daily with meals. For diabetes management 11/05/19  Yes Lindell Spar  I, NP  insulin detemir (LEVEMIR) 100 UNIT/ML injection Inject 0.5 mLs (50 Units total) into the skin 2 (two) times daily. For diabetes management Patient taking differently: Inject 40 Units into the skin 2 (two) times daily. For diabetes management 11/05/19  Yes Armandina Stammer I, NP  lisinopril (ZESTRIL) 40 MG tablet Take 1 tablet (40 mg total) by mouth daily. For high blood pressure 11/06/19  Yes Nwoko, Agnes I, NP  metoprolol tartrate (LOPRESSOR) 50 MG tablet Take 1 tablet (50 mg total) by mouth 2 (two) times daily. For high blood pressure 11/05/19  Yes Nwoko, Nicole Kindred I, NP  nitroGLYCERIN (NITROSTAT) 0.4 MG SL tablet Place 1 tablet (0.4 mg total) under the tongue every 5 (five) minutes as needed for chest pain (up to 3 doses). 11/10/16  Yes Corky Crafts, MD  pantoprazole (PROTONIX) 40 MG tablet Take 1 tablet (40 mg total) by mouth daily. For acid reflux 11/05/19  Yes Nwoko, Nicole Kindred I, NP  rosuvastatin (CRESTOR) 40 MG tablet Take 1 tablet (40 mg total) by mouth  daily. For high cholesterol 11/06/19  Yes Nwoko, Nicole Kindred I, NP  triamcinolone cream (KENALOG) 0.1 % Apply 1 application topically 2 (two) times daily as needed (itching).   Yes [provider]  sertraline (ZOLOFT) 100 MG tablet Take 1 tablet (100 mg total) by mouth daily. For depression 11/06/19   [name], NP    Allergies    Patient has no known allergies.  Review of Systems   Review of Systems  All other systems reviewed and are negative.   Physical Exam Updated Vital Signs BP 110/66 (BP Location: Left Arm)   Pulse 66   Temp 98.3 F (36.8 C) (Oral)   Resp 20   Ht 1.753 m (5\' 9" )   Wt 99.8 kg   SpO2 98%   BMI 32.49 kg/m   Physical Exam Vitals and nursing note reviewed.  Constitutional:      General: He is not in acute distress.    Appearance: He is well-developed.  HENT:     Head: Normocephalic and atraumatic.     Mouth/Throat:     Pharynx: No oropharyngeal exudate or posterior oropharyngeal erythema.  Eyes:     General: No scleral icterus.       Right eye: No discharge.        Left eye: No discharge.     Conjunctiva/sclera: Conjunctivae normal.     Pupils: Pupils are equal, round, and reactive to light.  Neck:     Thyroid: No thyromegaly.     Vascular: No JVD.  Cardiovascular:     Rate and Rhythm: Normal rate and regular rhythm.     Heart sounds: Normal heart sounds. No murmur. No friction rub. No gallop.   Pulmonary:     Effort: Pulmonary effort is normal. No respiratory distress.     Breath sounds: Normal breath sounds. No wheezing or rales.  Abdominal:     General: Bowel sounds are normal. There is no distension.     Palpations: Abdomen is soft. There is no mass.     Tenderness: There is no abdominal tenderness.  Musculoskeletal:        General: No tenderness. Normal range of motion.     Cervical back: Normal range of motion and neck supple.  Lymphadenopathy:     Cervical: No cervical adenopathy.  Skin:    General: Skin is warm and dry.      Findings: No erythema or rash.  Neurological:  Mental Status: He is alert.     Coordination: Coordination normal.  Psychiatric:        Behavior: Behavior normal.     Comments: The patient has a very flat affect, he speaks extremely softly, he does not make eye contact.  He reports that endorses severe depression, suicidal thoughts with a plan.  Endorses hallucination but does not appear to be actively responding to internal stimuli     ED Results / Procedures / Treatments   Labs (all labs ordered are listed, but only abnormal results are displayed) Labs Reviewed  COMPREHENSIVE METABOLIC PANEL - Abnormal; Notable for the following components:      Result Value   Glucose, Bld 113 (*)    All other components within normal limits  RAPID URINE DRUG SCREEN, HOSP PERFORMED - Abnormal; Notable for the following components:   Opiates POSITIVE (*)    All other components within normal limits  SALICYLATE LEVEL - Abnormal; Notable for the following components:   Salicylate Lvl <7.0 (*)    All other components within normal limits  CBG MONITORING, ED - Abnormal; Notable for the following components:   Glucose-Capillary 379 (*)    All other components within normal limits  CBG MONITORING, ED - Abnormal; Notable for the following components:   Glucose-Capillary 359 (*)    All other components within normal limits  CBG MONITORING, ED - Abnormal; Notable for the following components:   Glucose-Capillary 293 (*)    All other components within normal limits  CBG MONITORING, ED - Abnormal; Notable for the following components:   Glucose-Capillary 228 (*)    All other components within normal limits  CBG MONITORING, ED - Abnormal; Notable for the following components:   Glucose-Capillary 112 (*)    All other components within normal limits  RESPIRATORY PANEL BY RT PCR (FLU A&B, COVID)  ETHANOL  CBC WITH DIFFERENTIAL/PLATELET  ACETAMINOPHEN LEVEL  TROPONIN I (HIGH SENSITIVITY)     EKG None  Radiology No results found.  Procedures Procedures (including critical care time)  Medications Ordered in ED Medications  aspirin EC tablet 81 mg (81 mg Oral Given 12/17/19 0842)  gabapentin (NEURONTIN) capsule 300 mg (300 mg Oral Given 12/17/19 0842)  HYDROcodone-acetaminophen (NORCO/VICODIN) 5-325 MG per tablet 1 tablet (1 tablet Oral Given 12/17/19 0159)  hydrOXYzine (ATARAX/VISTARIL) tablet 25 mg (has no administration in time range)  ibuprofen (ADVIL) tablet 600 mg (600 mg Oral Given 12/17/19 0842)  insulin aspart (novoLOG) injection 0-15 Units (0 Units Subcutaneous Hold 12/17/19 1257)  insulin aspart (novoLOG) injection 14 Units (0 Units Subcutaneous Hold 12/17/19 1257)  insulin detemir (LEVEMIR) injection 50 Units (50 Units Subcutaneous Given 12/17/19 0941)  lisinopril (ZESTRIL) tablet 40 mg (40 mg Oral Given 12/17/19 0843)  metoprolol tartrate (LOPRESSOR) tablet 50 mg (50 mg Oral Given 12/17/19 0843)  nitroGLYCERIN (NITROSTAT) SL tablet 0.4 mg (has no administration in time range)  pantoprazole (PROTONIX) EC tablet 40 mg (40 mg Oral Given 12/17/19 0843)  rosuvastatin (CRESTOR) tablet 40 mg (40 mg Oral Given 12/17/19 0941)  sertraline (ZOLOFT) tablet 100 mg (100 mg Oral Given 12/17/19 2536)    ED Course  I have reviewed the triage vital signs and the nursing notes.  Pertinent labs & imaging results that were available during my care of the patient were reviewed by me and considered in my medical decision making (see chart for details).  Clinical Course as of Dec 16 1448  Sat Dec 16, 2019  2218 I discussed the case with  Ford at behavioral health at 10:18 PM, he recommends inpatient treatment, they will look for placement.   [BM]    Clinical Course User Index [BM] Eber Hong, MD   MDM Rules/Calculators/A&P                       To medically clear this patient he will need an EKG and a troponin however psychiatrically the patient is in distress and will need to  have evaluation by the psychiatric team with placement.  He is high risk for completing a suicide attempt given his age has male his ethnicity and his history of prior attempts.  At this time the patient will need to be further evaluated with EKG and lab work with a psychiatric consult and plan for psychiatric admission as long as medically he is stabilized.  He does not have any chest pain at this time.  Pt needs admission - psych has seen - recommends same  Final Clinical Impression(s) / ED Diagnoses Final diagnoses:  Suicidal ideation      Eber Hong, MD 12/17/19 1450
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
## 2019-12-16 NOTE — ED Notes (Signed)
Dinner Tray Ordered @ 1859. 

## 2019-12-16 NOTE — ED Notes (Signed)
Pts belongings placed in locker 5 & 6.

## 2019-12-16 NOTE — ED Notes (Signed)
Pt placed in room 42 for TTS. Cords removed and nurses and tech at desk notified that pt is in the process of his TTS.

## 2019-12-16 NOTE — ED Triage Notes (Signed)
The pt was brought in by [facility]  [facility] called ems he told ems he was going to jump off a bridge  He has psy meds given in the past 1-2 months he has not gotten filled he was last admitted 1-2 months for the same

## 2019-12-17 ENCOUNTER — Other Ambulatory Visit: Payer: Self-pay

## 2019-12-17 LAB — RAPID URINE DRUG SCREEN, HOSP PERFORMED
Amphetamines: NOT DETECTED
Barbiturates: NOT DETECTED
Benzodiazepines: NOT DETECTED
Cocaine: NOT DETECTED
Opiates: POSITIVE — AB
Tetrahydrocannabinol: NOT DETECTED

## 2019-12-17 LAB — CBG MONITORING, ED
Glucose-Capillary: 359 mg/dL — ABNORMAL HIGH (ref 70–99)
Glucose-Capillary: 293 mg/dL — ABNORMAL HIGH (ref 70–99)
Glucose-Capillary: 228 mg/dL — ABNORMAL HIGH (ref 70–99)
Glucose-Capillary: 112 mg/dL — ABNORMAL HIGH (ref 70–99)

## 2019-12-17 NOTE — ED Notes (Addendum)
[name] [name], [facility], aware of need for transport. Advised is taking care of a situation and will call back. ALL belongings - 7 labeled items including suitcase, labeled personal bags x 2, and labeled belongings bags - [facility] - Pt aware.

## 2019-12-17 NOTE — ED Notes (Signed)
Per Crystal, [facility], [facility] has been accepted [facility] - may arrive after 1200. Pt aware.

## 2019-12-17 NOTE — ED Notes (Signed)
Pt aware [facility] waiting for his CBG to decrease to below 250, then may accept him. Pt voiced understanding and agreement w/tx plan.

## 2019-12-17 NOTE — ED Notes (Signed)
Dr. Tegeler at bedside.  

## 2019-12-17 NOTE — ED Notes (Signed)
Sitter remains at bedside

## 2019-12-17 NOTE — BH Assessment (Signed)
Received call from [place] at [facility]. She said they are interested in accepting Pt to their facility but Pt's glucose needs to be below 250. She said she would check back in a few hours. Notified Jeni Salles, RN.    Pamalee Leyden, [facility], [facility] Triage Specialist [phone]

## 2019-12-17 NOTE — ED Notes (Signed)
[facility] contact: [phone] Update with new CBG at 0800

## 2019-12-17 NOTE — ED Notes (Signed)
Lunch tray ordered 

## 2019-12-17 NOTE — ED Notes (Signed)
Called to inquire re: pt's lunch.

## 2019-12-17 NOTE — ED Provider Notes (Signed)
1:27 PM Nursing reports that [facility] is been [facility] under the care of Dr. Robet Leu for further inpatient psychiatric management.  I went to assess the patient and he had no complaints.  Lungs were clear and chest was nontender.  Patient was eating his lunch without problem.  Patient will be transferred for further management. EMTALA documentation filled out for transfer.    [name] [name], Canary Brim, MD 12/17/19 1328

## 2019-12-17 NOTE — ED Notes (Signed)
Pt received lunch tray 

## 2019-12-17 NOTE — BHH Counselor (Signed)
  Per Karie Soda, [facility] Admissions   [facility] accepted pt into the [facility] unit B  Pt can arrive after 12:00 pm.   The accepting physician is Dr. Otho Perl, MD. Report is to be called to [phone].   *PT NEED TO BE TRANSPORTED TO THE [facility] FIRST FOR TEMPERATURE SCREENING.  THEN TRANSPORTED TO THE [name] FOR ASSESSMENT*   Qamar Aughenbaugh L. Francisco Ostrovsky, MS, [facility], [facility] Therapeutic Triage Specialist  [phone]

## 2019-12-20 DIAGNOSIS — I10 Essential (primary) hypertension: Secondary | ICD-10-CM | POA: Insufficient documentation

## 2019-12-26 MED ORDER — FLUOXETINE HCL 20 MG PO CAPS
20.00 | ORAL_CAPSULE | ORAL | Status: DC
Start: 2019-12-27 — End: 2019-12-26

## 2019-12-26 MED ORDER — DEXTROSE 10 % IV SOLN
125.00 | INTRAVENOUS | Status: DC
Start: ? — End: 2019-12-26

## 2019-12-26 MED ORDER — ONDANSETRON HCL 4 MG/2ML IJ SOLN
4.00 | INTRAMUSCULAR | Status: DC
Start: ? — End: 2019-12-26

## 2019-12-26 MED ORDER — OXYCODONE-ACETAMINOPHEN 5-325 MG PO TABS
1.00 | ORAL_TABLET | ORAL | Status: DC
Start: ? — End: 2019-12-26

## 2019-12-26 MED ORDER — LISINOPRIL 20 MG PO TABS
20.00 | ORAL_TABLET | ORAL | Status: DC
Start: 2019-12-27 — End: 2019-12-26

## 2019-12-26 MED ORDER — METOPROLOL TARTRATE 50 MG PO TABS
50.00 | ORAL_TABLET | ORAL | Status: DC
Start: 2019-12-26 — End: 2019-12-26

## 2019-12-26 MED ORDER — AMITRIPTYLINE HCL 25 MG PO TABS
25.00 | ORAL_TABLET | ORAL | Status: DC
Start: 2019-12-26 — End: 2019-12-26

## 2019-12-26 MED ORDER — GLUCOSE 40 % PO GEL
15.00 | ORAL | Status: DC
Start: ? — End: 2019-12-26

## 2019-12-26 MED ORDER — INSULIN LISPRO 100 UNIT/ML ~~LOC~~ SOLN
10.00 | SUBCUTANEOUS | Status: DC
Start: 2019-12-27 — End: 2019-12-26

## 2019-12-26 MED ORDER — ENOXAPARIN SODIUM 40 MG/0.4ML ~~LOC~~ SOLN
40.00 | SUBCUTANEOUS | Status: DC
Start: 2019-12-27 — End: 2019-12-26

## 2019-12-26 MED ORDER — INSULIN GLARGINE 100 UNIT/ML SOLOSTAR PEN
50.00 | PEN_INJECTOR | SUBCUTANEOUS | Status: DC
Start: 2019-12-26 — End: 2019-12-26

## 2019-12-26 MED ORDER — GABAPENTIN 300 MG PO CAPS
300.00 | ORAL_CAPSULE | ORAL | Status: DC
Start: 2019-12-26 — End: 2019-12-26

## 2019-12-26 MED ORDER — FAMOTIDINE 20 MG PO TABS
20.00 | ORAL_TABLET | ORAL | Status: DC
Start: 2019-12-26 — End: 2019-12-26

## 2019-12-26 MED ORDER — CYCLOBENZAPRINE HCL 10 MG PO TABS
10.00 | ORAL_TABLET | ORAL | Status: DC
Start: ? — End: 2019-12-26

## 2019-12-26 MED ORDER — LIDOCAINE 4 % EX PTCH
1.00 | MEDICATED_PATCH | CUTANEOUS | Status: DC
Start: 2019-12-27 — End: 2019-12-26

## 2019-12-26 MED ORDER — ASPIRIN 81 MG PO TBEC
81.00 | DELAYED_RELEASE_TABLET | ORAL | Status: DC
Start: 2019-12-27 — End: 2019-12-26

## 2019-12-26 MED ORDER — ROSUVASTATIN CALCIUM 5 MG PO TABS
10.00 | ORAL_TABLET | ORAL | Status: DC
Start: 2019-12-27 — End: 2019-12-26

## 2019-12-26 MED ORDER — SENNOSIDES 8.6 MG PO TABS
17.20 | ORAL_TABLET | ORAL | Status: DC
Start: ? — End: 2019-12-26

## 2019-12-26 MED ORDER — MELATONIN 3 MG PO TABS
3.00 | ORAL_TABLET | ORAL | Status: DC
Start: ? — End: 2019-12-26

## 2019-12-26 MED ORDER — INSULIN LISPRO 100 UNIT/ML ~~LOC~~ SOLN
1.00 | SUBCUTANEOUS | Status: DC
Start: 2019-12-27 — End: 2019-12-26

## 2019-12-29 ENCOUNTER — Inpatient Hospital Stay: Payer: Medicare HMO | Admitting: Registered Nurse

## 2020-03-08 DIAGNOSIS — M76892 Other specified enthesopathies of left lower limb, excluding foot: Secondary | ICD-10-CM | POA: Insufficient documentation

## 2020-04-27 DIAGNOSIS — E1065 Type 1 diabetes mellitus with hyperglycemia: Secondary | ICD-10-CM | POA: Diagnosis not present

## 2020-05-08 DIAGNOSIS — R69 Illness, unspecified: Secondary | ICD-10-CM | POA: Diagnosis not present

## 2020-05-10 DIAGNOSIS — M7061 Trochanteric bursitis, right hip: Secondary | ICD-10-CM | POA: Insufficient documentation

## 2020-05-10 DIAGNOSIS — G8929 Other chronic pain: Secondary | ICD-10-CM | POA: Diagnosis not present

## 2020-05-10 DIAGNOSIS — M7062 Trochanteric bursitis, left hip: Secondary | ICD-10-CM | POA: Diagnosis not present

## 2020-05-22 DIAGNOSIS — E1010 Type 1 diabetes mellitus with ketoacidosis without coma: Secondary | ICD-10-CM | POA: Diagnosis not present

## 2020-05-22 DIAGNOSIS — I10 Essential (primary) hypertension: Secondary | ICD-10-CM | POA: Diagnosis not present

## 2020-05-22 DIAGNOSIS — E160 Drug-induced hypoglycemia without coma: Secondary | ICD-10-CM | POA: Diagnosis not present

## 2020-05-22 DIAGNOSIS — I252 Old myocardial infarction: Secondary | ICD-10-CM | POA: Diagnosis not present

## 2020-05-22 DIAGNOSIS — E10618 Type 1 diabetes mellitus with other diabetic arthropathy: Secondary | ICD-10-CM | POA: Diagnosis not present

## 2020-05-22 DIAGNOSIS — R69 Illness, unspecified: Secondary | ICD-10-CM | POA: Diagnosis not present

## 2020-05-22 DIAGNOSIS — E1065 Type 1 diabetes mellitus with hyperglycemia: Secondary | ICD-10-CM | POA: Diagnosis not present

## 2020-05-22 DIAGNOSIS — I2510 Atherosclerotic heart disease of native coronary artery without angina pectoris: Secondary | ICD-10-CM | POA: Diagnosis not present

## 2020-05-22 DIAGNOSIS — E1059 Type 1 diabetes mellitus with other circulatory complications: Secondary | ICD-10-CM | POA: Diagnosis not present

## 2020-05-22 DIAGNOSIS — E1051 Type 1 diabetes mellitus with diabetic peripheral angiopathy without gangrene: Secondary | ICD-10-CM | POA: Diagnosis not present

## 2020-05-23 DIAGNOSIS — R69 Illness, unspecified: Secondary | ICD-10-CM | POA: Diagnosis not present

## 2020-05-23 DIAGNOSIS — Y939 Activity, unspecified: Secondary | ICD-10-CM | POA: Diagnosis not present

## 2020-05-23 DIAGNOSIS — M7061 Trochanteric bursitis, right hip: Secondary | ICD-10-CM | POA: Diagnosis not present

## 2020-05-23 DIAGNOSIS — M7062 Trochanteric bursitis, left hip: Secondary | ICD-10-CM | POA: Diagnosis not present

## 2020-06-18 DIAGNOSIS — M7062 Trochanteric bursitis, left hip: Secondary | ICD-10-CM | POA: Diagnosis not present

## 2020-06-18 DIAGNOSIS — G8929 Other chronic pain: Secondary | ICD-10-CM | POA: Diagnosis not present

## 2020-06-18 DIAGNOSIS — Y939 Activity, unspecified: Secondary | ICD-10-CM | POA: Diagnosis not present

## 2020-06-18 DIAGNOSIS — M7061 Trochanteric bursitis, right hip: Secondary | ICD-10-CM | POA: Diagnosis not present

## 2020-06-18 DIAGNOSIS — M545 Low back pain: Secondary | ICD-10-CM | POA: Diagnosis not present

## 2020-06-24 DIAGNOSIS — M7061 Trochanteric bursitis, right hip: Secondary | ICD-10-CM | POA: Diagnosis not present

## 2020-06-24 DIAGNOSIS — G8929 Other chronic pain: Secondary | ICD-10-CM | POA: Diagnosis not present

## 2020-06-24 DIAGNOSIS — M7062 Trochanteric bursitis, left hip: Secondary | ICD-10-CM | POA: Diagnosis not present

## 2020-06-24 DIAGNOSIS — Y939 Activity, unspecified: Secondary | ICD-10-CM | POA: Diagnosis not present

## 2020-06-24 DIAGNOSIS — M545 Low back pain: Secondary | ICD-10-CM | POA: Diagnosis not present

## 2020-06-27 DIAGNOSIS — E1065 Type 1 diabetes mellitus with hyperglycemia: Secondary | ICD-10-CM | POA: Diagnosis not present

## 2020-07-17 DIAGNOSIS — M7062 Trochanteric bursitis, left hip: Secondary | ICD-10-CM | POA: Diagnosis not present

## 2020-07-17 DIAGNOSIS — G8929 Other chronic pain: Secondary | ICD-10-CM | POA: Diagnosis not present

## 2020-07-17 DIAGNOSIS — M545 Low back pain: Secondary | ICD-10-CM | POA: Diagnosis not present

## 2020-07-17 DIAGNOSIS — M7061 Trochanteric bursitis, right hip: Secondary | ICD-10-CM | POA: Diagnosis not present

## 2020-07-17 DIAGNOSIS — Y939 Activity, unspecified: Secondary | ICD-10-CM | POA: Diagnosis not present

## 2020-07-25 DIAGNOSIS — M7062 Trochanteric bursitis, left hip: Secondary | ICD-10-CM | POA: Diagnosis not present

## 2020-07-25 DIAGNOSIS — M545 Low back pain: Secondary | ICD-10-CM | POA: Diagnosis not present

## 2020-07-25 DIAGNOSIS — M7061 Trochanteric bursitis, right hip: Secondary | ICD-10-CM | POA: Diagnosis not present

## 2020-07-25 DIAGNOSIS — Y939 Activity, unspecified: Secondary | ICD-10-CM | POA: Diagnosis not present

## 2020-07-25 DIAGNOSIS — G8929 Other chronic pain: Secondary | ICD-10-CM | POA: Diagnosis not present

## 2020-07-28 DIAGNOSIS — E1065 Type 1 diabetes mellitus with hyperglycemia: Secondary | ICD-10-CM | POA: Diagnosis not present

## 2020-07-30 DIAGNOSIS — G8929 Other chronic pain: Secondary | ICD-10-CM | POA: Diagnosis not present

## 2020-07-30 DIAGNOSIS — M545 Low back pain: Secondary | ICD-10-CM | POA: Diagnosis not present

## 2020-07-30 DIAGNOSIS — Y939 Activity, unspecified: Secondary | ICD-10-CM | POA: Diagnosis not present

## 2020-07-30 DIAGNOSIS — M7062 Trochanteric bursitis, left hip: Secondary | ICD-10-CM | POA: Diagnosis not present

## 2020-07-30 DIAGNOSIS — M7061 Trochanteric bursitis, right hip: Secondary | ICD-10-CM | POA: Diagnosis not present

## 2022-03-19 NOTE — Progress Notes (Signed)
?Name: [name] [name]  ?MRN/ DOB: [id number], 09/12/1956   ?Age/ Sex: 66 y.o., male   ? ?PCP: Shawnee Knapp, MD   ?Reason for Endocrinology Evaluation: Type 1 Diabetes Mellitus  ?   ?Date of Initial Endocrinology Visit: 03/20/2022   ? ? ?PATIENT IDENTIFIER: [name] is a 66 y.o. male with a past medical history of T2DM, CAD, CVA. The patient presented for initial endocrinology clinic visit on 03/20/2022 for consultative assistance with his diabetes management.  ? ? ?HPI: ?[name] [name] was  ? ? ?Diagnosed with DM in 1990's  ?Prior Medications tried/Intolerance: intolerant to higher doses of metformin  ?Currently checking blood sugars multiple  x / day,  through CGM  ?Hypoglycemia episodes : yes               Symptoms: yes                 Frequency: 2/ month  ?Hemoglobin A1c has ranged from 7.7% in 2017, peaking at >14.0% in 2014. ?Patient required assistance for hypoglycemia: yes ?Patient has required hospitalization within the last 1 year from hyper or hypoglycemia: no  ? ? ?Has Hx of multiple DKA's  ? ?He has OMNIPOD since 08/2021 but needs training ? ?No Hx of Pancreatitis  ?Has diarrhea with metformin but no nausea or vomiting  ?Has tingling and numbness of legs  ? ? ? ? ? ? ? ? ?HOME DIABETES REGIMEN: ?Lantus 40 units BID ?Novolog 20 TIDQAC ?Metformin 500 mg daily ? ? ?Statin: yes ?ACE-I/ARB: yes ? ? ? ? ?CONTINUOUS GLUCOSE MONITORING RECORD INTERPRETATION   ? ?Dates of Recording: 3/23-4/21/2023 ? ?Sensor description:dexcom ? ?Results statistics: ?  ?CGM use % of time 73  ?Average and SD 169/64  ?Time in range      58  %  ?% Time Above 180 27  ?% Time above 250 12  ?% Time Below target 2  ? ? ?Glycemic patterns summary: Hyperglycemia noted overnight and postprandial  ? ?Hyperglycemic episodes  postprandial ? ?Hypoglycemic episodes occurred post bolus  ? ?Overnight periods: high  ? ? ? ?DIABETIC COMPLICATIONS: ?Microvascular complications:  ?Neuropathy ?Denies: CKD ?Last eye exam: Completed  ? ?Macrovascular  complications:  ?CAD, CVA ?Denies:  PVD ? ? ?PAST HISTORY: ?Past Medical History:  ?Past Medical History:  ?Diagnosis Date  ? Anxiety   ? Barrett's esophagus   ? CAD (coronary artery disease)   ? a. NSTEMI 05/2014 - occluded RCA dominant proximal s/p asp-thrombectomy/DES to RCA, minimal LAD/LCx, EF 50% by cath, 65-70% by echo.  ? Depression   ? Diabetes type 2, uncontrolled   ? Former tobacco use   ? GERD (gastroesophageal reflux disease)   ? Hemorrhoids   ? hx of  ? Hypercholesterolemia   ? Hypertension   ? MI (myocardial infarction) ([place])   ? Peripheral neuropathy   ? ?Past Surgical History:  ?Past Surgical History:  ?Procedure Laterality Date  ? LEFT HEART CATHETERIZATION WITH CORONARY ANGIOGRAM N/A 06/14/2014  ? Procedure: LEFT HEART CATHETERIZATION WITH CORONARY ANGIOGRAM;  Surgeon: Jettie Booze, MD;  Location: [facility] CATH LAB;  Service: Cardiovascular;  Laterality: N/A;  ? TONSILLECTOMY    ?  ?Social History:  reports that he quit smoking about 23 years ago. His smoking use included cigarettes. He has a 0.75 pack-year smoking history. He has never used smokeless tobacco. He reports that he does not drink alcohol and does not use drugs. ?Family History:  ?Family History  ?Problem Relation Age  of Onset  ? Hypertension Mother   ? Alzheimer's disease Mother   ? Alcohol abuse Father   ? Cancer Father 72  ?     "Throat"  ? Diabetes type II Brother   ? Cancer Brother 11  ?     throat cancer  ? Heart disease Neg Hx   ? ? ? ?HOME MEDICATIONS: ?Allergies as of 03/20/2022   ?No Known Allergies ?  ? ?  ?Medication List  ?  ? ?  ? Accurate as of March 20, 2022 12:34 PM. If you have any questions, ask your nurse or doctor.  ?  ?  ? ?  ? ?STOP taking these medications   ? ?insulin detemir 100 UNIT/ML injection ?Commonly known as: LEVEMIR ?Stopped by: Dorita Sciara, MD ?  ?metFORMIN 1000 MG tablet ?Commonly known as: GLUCOPHAGE ?Stopped by: Dorita Sciara, MD ?  ? ?  ? ?TAKE these medications   ? ?aspirin 81 MG  EC tablet ?Commonly known as: EQ Aspirin Adult Low Dose ?Take 1 tablet (81 mg total) by mouth daily. Swallow whole: For heart health ?  ?gabapentin 300 MG capsule ?Commonly known as: NEURONTIN ?Take 1 capsule (300 mg total) by mouth 2 (two) times daily. For agitation/neuropathic pain ?  ?HYDROcodone-acetaminophen 5-325 MG tablet ?Commonly known as: NORCO/VICODIN ?Take 1 tablet by mouth every 12 (twelve) hours as needed for moderate pain. ?  ?hydrocortisone cream 1 % ?Apply 1 application topically 2 (two) times daily as needed for itching. ?  ?hydrOXYzine 25 MG tablet ?Commonly known as: ATARAX ?Take 1 tablet (25 mg total) by mouth 3 (three) times daily as needed for anxiety. ?  ?ibuprofen 600 MG tablet ?Commonly known as: ADVIL ?Take 1 tablet (600 mg total) by mouth every 6 (six) hours as needed for fever, headache or mild pain. (May buy from over the counter): For pain ?  ?ID Now COVID-19 Kit ?Generic drug: COVID-19 Test ?TEST AS DIRECTED TODAY ?  ?insulin aspart 100 UNIT/ML injection ?Commonly known as: novoLOG ?Inject 14 Units into the skin 3 (three) times daily with meals. For diabetes management ?What changed:  ?how much to take ?Another medication with the same name was removed. Continue taking this medication, and follow the directions you see here. ?  ?insulin glargine 100 UNIT/ML injection ?Commonly known as: LANTUS ?Inject 80 Units into the skin 2 (two) times daily. 40am and 40 at bde time ?  ?lisinopril 40 MG tablet ?Commonly known as: ZESTRIL ?Take 1 tablet (40 mg total) by mouth daily. For high blood pressure ?  ?metoprolol tartrate 50 MG tablet ?Commonly known as: LOPRESSOR ?Take 1 tablet (50 mg total) by mouth 2 (two) times daily. For high blood pressure ?  ?nitroGLYCERIN 0.4 MG SL tablet ?Commonly known as: NITROSTAT ?Place 1 tablet (0.4 mg total) under the tongue every 5 (five) minutes as needed for chest pain (up to 3 doses). ?  ?olmesartan-hydrochlorothiazide 40-25 MG tablet ?Commonly known as:  BENICAR HCT ?Take 1 tablet by mouth daily. ?  ?Ozempic (0.25 or 0.5 MG/DOSE) 2 MG/3ML Sopn ?Generic drug: Semaglutide(0.25 or 0.5MG/DOS) ?Inject 0.5 mg into the skin once a week. ?Started by: Dorita Sciara, MD ?  ?pantoprazole 40 MG tablet ?Commonly known as: PROTONIX ?Take 1 tablet (40 mg total) by mouth daily. For acid reflux ?  ?rosuvastatin 40 MG tablet ?Commonly known as: CRESTOR ?Take 1 tablet (40 mg total) by mouth daily. For high cholesterol ?  ?sertraline 100 MG tablet ?Commonly known as:  ZOLOFT ?Take 1 tablet (100 mg total) by mouth daily. For depression ?  ?triamcinolone cream 0.1 % ?Commonly known as: KENALOG ?Apply 1 application topically 2 (two) times daily as needed (itching). ?  ? ?  ? ? ? ?ALLERGIES: ?No Known Allergies ? ? ?REVIEW OF SYSTEMS: ?A comprehensive ROS was conducted with the patient and is negative except as per HPI and below:  ?ROS ? ?  ?OBJECTIVE:  ? ?VITAL SIGNS: BP 112/72 (BP Location: Left Arm, Patient Position: Sitting, Cuff Size: Large)   Pulse 71   Ht _0  (1.753 m)   Wt 264 lb (119.7 kg)   SpO2 95%   BMI 38.99 kg/m?   ? ?PHYSICAL EXAM:  ?General: Pt appears well and is in NAD  ?Neck: General: Supple without adenopathy or carotid bruits. ?Thyroid: Thyroid size normal.  No goiter or nodules appreciated.   ?Lungs: Clear with good BS bilat with no rales, rhonchi, or wheezes  ?Heart: RRR with normal S1 and S2 and no gallops; no murmurs; no rub  ?Abdomen: Normoactive bowel sounds, soft, nontender, without masses or organomegaly palpable  ?Extremities:  ?Lower extremities - No pretibial edema. No lesions.  ?Neuro: MS is good with appropriate affect, pt is alert and Ox3  ? ? ?DM foot exam: 03/20/2022 ? ?The skin of the feet is without sores or ulcerations, toe nails thickened , long and curved  ?The pedal pulses are 2+ on right and 2+ on left. ?The sensation is intact to a screening 5.07, 10 gram monofilament bilaterally ? ? ?DATA REVIEWED: ? ?Lab Results  ?Component Value  Date  ? HGBA1C 7.8 (A) 03/20/2022  ? HGBA1C 9.4 (H) 10/30/2019  ? HGBA1C 9.7 (H) 06/25/2019  ? ? Latest Reference Range & Units 03/20/22 12:30  ?Sodium 135 - 145 mEq/L 138  ?Potassium 3.5 - 5.1 mEq/L 4.8  ?Chlorid

## 2022-03-20 ENCOUNTER — Encounter: Payer: Self-pay | Admitting: Internal Medicine

## 2022-03-20 ENCOUNTER — Ambulatory Visit: Payer: Medicare HMO | Admitting: Internal Medicine

## 2022-03-20 VITALS — BP 112/72 | HR 71 | Ht 69.0 in | Wt 264.0 lb

## 2022-03-20 DIAGNOSIS — E1042 Type 1 diabetes mellitus with diabetic polyneuropathy: Secondary | ICD-10-CM | POA: Insufficient documentation

## 2022-03-20 DIAGNOSIS — E1059 Type 1 diabetes mellitus with other circulatory complications: Secondary | ICD-10-CM

## 2022-03-20 DIAGNOSIS — E1065 Type 1 diabetes mellitus with hyperglycemia: Secondary | ICD-10-CM | POA: Diagnosis not present

## 2022-03-20 DIAGNOSIS — E109 Type 1 diabetes mellitus without complications: Secondary | ICD-10-CM | POA: Insufficient documentation

## 2022-03-20 DIAGNOSIS — R739 Hyperglycemia, unspecified: Secondary | ICD-10-CM

## 2022-03-20 LAB — MICROALBUMIN / CREATININE URINE RATIO
Creatinine,U: 150 mg/dL
Microalb Creat Ratio: 2.3 mg/g (ref 0.0–30.0)
Microalb, Ur: 3.4 mg/dL — ABNORMAL HIGH (ref 0.0–1.9)

## 2022-03-20 LAB — POCT GLYCOSYLATED HEMOGLOBIN (HGB A1C): Hemoglobin A1C: 7.8 % — AB (ref 4.0–5.6)

## 2022-03-20 LAB — LIPID PANEL
Cholesterol: 136 mg/dL (ref 0–200)
HDL: 37.5 mg/dL — ABNORMAL LOW (ref >39.00)
LDL Cholesterol: 60 mg/dL (ref 0–99)
NonHDL: 98.2
Total CHOL/HDL Ratio: 4
Triglycerides: 189 mg/dL — ABNORMAL HIGH (ref 0.0–149.0)
VLDL: 37.8 mg/dL (ref 0.0–40.0)

## 2022-03-20 LAB — T4, FREE: Free T4: 0.66 ng/dL (ref 0.60–1.60)

## 2022-03-20 LAB — BASIC METABOLIC PANEL
BUN: 22 mg/dL (ref 6–23)
CO2: 23 mEq/L (ref 19–32)
Calcium: 9.1 mg/dL (ref 8.4–10.5)
Chloride: 104 mEq/L (ref 96–112)
Creatinine, Ser: 1.28 mg/dL (ref 0.40–1.50)
GFR: 59.46 mL/min — ABNORMAL LOW (ref >60.00)
Glucose, Bld: 166 mg/dL — ABNORMAL HIGH (ref 70–99)
Potassium: 4.8 mEq/L (ref 3.5–5.1)
Sodium: 138 mEq/L (ref 135–145)

## 2022-03-20 LAB — TSH: TSH: 1.57 u[IU]/mL (ref 0.35–5.50)

## 2022-03-20 MED ORDER — OZEMPIC (0.25 OR 0.5 MG/DOSE) 2 MG/3ML ~~LOC~~ SOPN: 0.5000 mg | PEN_INJECTOR | SUBCUTANEOUS | 1 refills | Status: DC

## 2022-03-20 NOTE — Patient Instructions (Addendum)
-   STOP Metformin  ?- Start Ozempic 0.25 mg weekly for 6 weeks, than increase to 0.5 mg weekly after that  ?-  Increase Lantus 44 units twice daily  ?- Novolog 16 units with each meal  ?-Novolog correctional insulin: ADD extra units on insulin to your meal-time Novolog dose if your blood sugars are higher than 150. Use the scale below to help guide you:  ? ?Blood sugar before meal Number of units to inject  ?Less than 150 0 unit  ?151 -  170 1 units  ?171 -  190 2 units  ?191 -  210 3 units  ?211 -  230 4 units  ?231 -  250 5 units  ?251 -  270 6 units  ?271 -  290 7 units  ?291 -  310 8 units  ? ?HOW TO TREAT LOW BLOOD SUGARS (Blood sugar LESS THAN 70 MG/DL) ?Please follow the RULE OF 15 for the treatment of hypoglycemia treatment (when your (blood sugars are less than 70 mg/dL)  ? ?STEP 1: Take 15 grams of carbohydrates when your blood sugar is low, which includes:  ?3-4 GLUCOSE TABS  OR ?3-4 OZ OF JUICE OR REGULAR SODA OR ?ONE TUBE OF GLUCOSE GEL   ? ?STEP 2: RECHECK blood sugar in 15 MINUTES ?STEP 3: If your blood sugar is still low at the 15 minute recheck --> then, go back to STEP 1 and treat AGAIN with another 15 grams of carbohydrates. ? ?

## 2022-03-25 ENCOUNTER — Telehealth: Payer: Self-pay | Admitting: Nutrition

## 2022-03-25 NOTE — Telephone Encounter (Signed)
Message left to call me to schedule his OmniPod training ?

## 2022-04-01 ENCOUNTER — Encounter: Payer: Medicare HMO | Attending: Internal Medicine | Admitting: Nutrition

## 2022-04-01 DIAGNOSIS — E1065 Type 1 diabetes mellitus with hyperglycemia: Secondary | ICD-10-CM | POA: Insufficient documentation

## 2022-04-01 DIAGNOSIS — E0865 Diabetes mellitus due to underlying condition with hyperglycemia: Secondary | ICD-10-CM | POA: Insufficient documentation

## 2022-04-01 DIAGNOSIS — Z794 Long term (current) use of insulin: Secondary | ICD-10-CM | POA: Insufficient documentation

## 2022-04-01 DIAGNOSIS — E1042 Type 1 diabetes mellitus with diabetic polyneuropathy: Secondary | ICD-10-CM | POA: Insufficient documentation

## 2022-04-02 ENCOUNTER — Telehealth: Payer: Self-pay | Admitting: Nutrition

## 2022-04-02 NOTE — Progress Notes (Signed)
Patient was trained on how to use the Omni Pod 5 insulin pump.  Settings were entered by patient per Dr. Harvel Ricks orders:  Basal rate: 2.75u/hr, ISF: 1, ISF: 20, target: 110 with correction over 110, timing 4 hours, He was  instructioned to take 10 grams ( 10 units), with each meal.  He re demonstrated how to give a bolus correctly, with instructions to take his meal time insulin and insert blood sugar readings before all meal.  We also discussed low blood sugar protocols--symptoms, and treatments.  He reported good understanding of this.  We discussed IOB, and what this mean.  He reported good understanding of this. ?He reports that he would like to learn how to count carbs.  He will return in one week to review all pump topics and discuss carb counting.   ?We linked his dexcom to his pump and his pump to [facility], and [facility].   ?We reviewed all topics on the checklist and he signed the checklist as understanding all topics. ?He was given a pump packet handout on sick days, supplies to carry with you at all times, high and low blood sugar protocols and told to read this over.  We will discuss all of these topics again in one week.  He appeared a little overwhelmed, so we set up another appointment next week to review again all topics.  He was told to call the office,  if blood sugars drop low or remain over 200.Marland Kitchen  He agreed to do this.  He was reminded to stop his Lantus, and he reported good understanding of this.  He had no final questions. ?

## 2022-04-02 NOTE — Patient Instructions (Signed)
Read over pump manual and pump packet handouts ?Call office if blood sugars remain over 200, or drop below 70. ?Call pump help line if questions about how to use the pump. ?

## 2022-04-02 NOTE — Telephone Encounter (Signed)
Patient reports that the pod fell off his arm last night during the night, and he filled another pod and attached it without difficulty.  His FBS today was 130. He ate breakfast and took his 10u, which was 9.45u, and his blood sugar 1hr. Pc was 133.  He had 7.1u of IOB.  We discussed this, and he said he ate oatmeal.  He was reminded to have protein with every meal, but that he will need to treat this low for the next 3 hours.  He reported good understanding of this. ?He was told to call the office if his blood sugars continue to drop low this afternoon.  He agreed to do this and had no final questions. ?

## 2022-04-03 ENCOUNTER — Ambulatory Visit: Payer: Medicare HMO | Admitting: Podiatry

## 2022-04-08 ENCOUNTER — Encounter: Payer: Medicare HMO | Admitting: Nutrition

## 2022-04-08 DIAGNOSIS — E1065 Type 1 diabetes mellitus with hyperglycemia: Secondary | ICD-10-CM

## 2022-04-08 DIAGNOSIS — E1042 Type 1 diabetes mellitus with diabetic polyneuropathy: Secondary | ICD-10-CM

## 2022-04-09 ENCOUNTER — Other Ambulatory Visit: Payer: Self-pay | Admitting: Internal Medicine

## 2022-04-09 MED ORDER — INSULIN ASPART 100 UNIT/ML IJ SOLN: INTRAMUSCULAR | 3 refills | Status: DC

## 2022-04-10 NOTE — Patient Instructions (Signed)
Review pump packet given, and call if questions. ?

## 2022-04-10 NOTE — Progress Notes (Signed)
Patient reported no difficulties in changing pods, bolusing, or wearing the pump.  We reviewed alerts/alarms, high blood sugar protocols, sick day guidelines and correct pod placements, and the need to do correction boluses.  He had no final questions for me.  He was given a pump packet with all of the above topics in it, and told to review this and to call if questions. ?

## 2022-04-16 ENCOUNTER — Ambulatory Visit: Payer: Medicare HMO | Admitting: Podiatry

## 2022-04-20 ENCOUNTER — Telehealth: Payer: Self-pay

## 2022-04-20 NOTE — Telephone Encounter (Signed)
T, He needs to change the pod right away.  It looks like a cannula may be obstructed.  Otherwise, he needs to bolus insulin before every meal and may need to enter higher amounts of carbs before dinner and occasionally before lunch.

## 2022-04-20 NOTE — Telephone Encounter (Signed)
Pt contacted and advised to change POD as soon as possible. Pt confirmed he will change site. And continue to monitor sugars. Pt will follow up as needed.

## 2022-04-20 NOTE — Telephone Encounter (Signed)
Patient has been experiencing  high sugar levels. Patient states that he gave himself 30 units at lunch and sugar is still high. Please advise. Report has been placed on your desk.

## 2022-04-22 ENCOUNTER — Encounter: Payer: Medicare HMO | Admitting: Nutrition

## 2022-04-22 DIAGNOSIS — E0865 Diabetes mellitus due to underlying condition with hyperglycemia: Secondary | ICD-10-CM

## 2022-04-23 NOTE — Progress Notes (Signed)
Patient is here with his new PDM for his OmniPod 5.  Per Vernona Rieger Hite's instruction, the PDM was unliked and re linked to [facility] through his [employer] account.  He had no final questions.

## 2022-04-24 ENCOUNTER — Ambulatory Visit (INDEPENDENT_AMBULATORY_CARE_PROVIDER_SITE_OTHER): Payer: Medicare HMO | Admitting: Podiatry

## 2022-04-24 DIAGNOSIS — Z91199 Patient's noncompliance with other medical treatment and regimen due to unspecified reason: Secondary | ICD-10-CM

## 2022-04-24 NOTE — Progress Notes (Signed)
No show

## 2022-08-07 ENCOUNTER — Ambulatory Visit: Payer: Medicare HMO | Admitting: Internal Medicine

## 2022-08-07 NOTE — Progress Notes (Deleted)
Name: [name] [name]  MRN/ DOB: [id number], 29-Jun-1956   Age/ Sex: 66 y.o., male    PCP: Shawnee Knapp, MD   Reason for Endocrinology Evaluation: Type 1 Diabetes Mellitus     Date of Initial Endocrinology Visit: 03/20/2022    PATIENT IDENTIFIER: [name] [name] is a 66 y.o. male with a past medical history of T2DM, CAD, CVA. The patient presented for initial endocrinology clinic visit on 03/20/2022 for consultative assistance with his diabetes management.    HPI: [name] [name] was    Diagnosed with DM in 1990's  Prior Medications tried/Intolerance: intolerant to higher doses of metformin  Hemoglobin A1c has ranged from 7.7% in 2017, peaking at >14.0% in 2014.  Has Hx of multiple DKA's in the past   No Hx of Pancreatitis  Has diarrhea with metformin but no nausea or vomiting  Has tingling and numbness of legs    On his initial visit to our clinic he had an A1c of 7.8%, he already had OmniPod pump but he was not trained on it.  He was referred to our CDE for training at the time   SUBJECTIVE:   During the last visit (03/20/2022): A1c 7.8%  Today (08/07/22): [name] [name] is here for follow-up on diabetes management.  He checks his blood sugars multiple times daily through CGM.  The patient has *** had hypoglycemic episodes since the last clinic visit, which typically occur *** x / - most often occuring ***. The patient is *** symptomatic with these episodes, with symptoms of {symptoms; hypoglycemia:[id number]}.    This patient with type 1 diabetes is treated with OmniPod (insulin pump). During the visit the pump basal and bolus doses were reviewed including carb/insulin rations and supplemental doses. The clinical list was updated. The glucose meter download was reviewed in detail to determine if the current pump settings are providing the best glycemic control without excessive hypoglycemia.  Pump and meter download:    Pump   OmniPod Settings   Insulin type   NovoLog   Basal rate        0000-0200  0.8 u/h    0200-1000  1.5 u/h    1000-0000  0.8 u/h       I:C ratio       0000-0600  1:8    0600-1000  1:5    1000-2000  1:7    2000-0000  1:8       Sensitivity       0000  35       Goal       0000  70-110             Type & Model of Pump: OmniPod Insulin Type: Currently using NovoLog.   PUMP STATISTICS: Average BG: *** +/- *** Average Daily Carbs (g): *** +/- *** Average Total Daily Insulin: *** +/- *** Average Daily Basal: *** (*** %) Average Daily Bolus: *** (*** %)       HOME DIABETES REGIMEN: Ozempic 0.5 mg weekly NovoLog   Statin: yes ACE-I/ARB: yes        DIABETIC COMPLICATIONS: Microvascular complications:  Neuropathy Denies: CKD Last eye exam: Completed   Macrovascular complications:  CAD, CVA Denies:  PVD   PAST HISTORY: Past Medical History:  Past Medical History:  Diagnosis Date   Anxiety    Barrett's esophagus    CAD (coronary artery disease)    a. NSTEMI 05/2014 - occluded RCA dominant proximal s/p asp-thrombectomy/DES to RCA, minimal LAD/LCx, EF 50% by  cath, 65-70% by echo.   Depression    Diabetes type 2, uncontrolled    Former tobacco use    GERD (gastroesophageal reflux disease)    Hemorrhoids    hx of   Hypercholesterolemia    Hypertension    MI (myocardial infarction) ([place])    Peripheral neuropathy    Past Surgical History:  Past Surgical History:  Procedure Laterality Date   LEFT HEART CATHETERIZATION WITH CORONARY ANGIOGRAM N/A 06/14/2014   Procedure: LEFT HEART CATHETERIZATION WITH CORONARY ANGIOGRAM;  Surgeon: Jettie Booze, MD;  Location: [facility] CATH LAB;  Service: Cardiovascular;  Laterality: N/A;   TONSILLECTOMY      Social History:  reports that he quit smoking about 23 years ago. His smoking use included cigarettes. He has a 0.75 pack-year smoking history. He has never used smokeless tobacco. He reports that he does not drink alcohol and does not use drugs. Family History:  Family  History  Problem Relation Age of Onset   Hypertension Mother    Alzheimer's disease Mother    Alcohol abuse Father    Cancer Father 67       "Throat"   Diabetes type II Brother    Cancer Brother 48       throat cancer   Heart disease Neg Hx      HOME MEDICATIONS: Allergies as of 08/07/2022   No Known Allergies      Medication List        Accurate as of August 07, 2022  7:34 AM. If you have any questions, ask your nurse or doctor.          aspirin EC 81 MG tablet Commonly known as: EQ Aspirin Adult Low Dose Take 1 tablet (81 mg total) by mouth daily. Swallow whole: For heart health   gabapentin 300 MG capsule Commonly known as: NEURONTIN Take 1 capsule (300 mg total) by mouth 2 (two) times daily. For agitation/neuropathic pain   HYDROcodone-acetaminophen 5-325 MG tablet Commonly known as: NORCO/VICODIN Take 1 tablet by mouth every 12 (twelve) hours as needed for moderate pain.   hydrocortisone cream 1 % Apply 1 application topically 2 (two) times daily as needed for itching.   hydrOXYzine 25 MG tablet Commonly known as: ATARAX Take 1 tablet (25 mg total) by mouth 3 (three) times daily as needed for anxiety.   ibuprofen 600 MG tablet Commonly known as: ADVIL Take 1 tablet (600 mg total) by mouth every 6 (six) hours as needed for fever, headache or mild pain. (May buy from over the counter): For pain   ID Now COVID-19 Kit Generic drug: COVID-19 Test TEST AS DIRECTED TODAY   insulin aspart 100 UNIT/ML injection Commonly known as: novoLOG Max daily 100   insulin glargine 100 UNIT/ML injection Commonly known as: LANTUS Inject 80 Units into the skin 2 (two) times daily. 40am and 40 at bde time   lisinopril 40 MG tablet Commonly known as: ZESTRIL Take 1 tablet (40 mg total) by mouth daily. For high blood pressure   metoprolol tartrate 50 MG tablet Commonly known as: LOPRESSOR Take 1 tablet (50 mg total) by mouth 2 (two) times daily. For high blood  pressure   nitroGLYCERIN 0.4 MG SL tablet Commonly known as: NITROSTAT Place 1 tablet (0.4 mg total) under the tongue every 5 (five) minutes as needed for chest pain (up to 3 doses).   olmesartan-hydrochlorothiazide 40-25 MG tablet Commonly known as: BENICAR HCT Take 1 tablet by mouth daily.   Ozempic (0.25 or  0.5 MG/DOSE) 2 MG/3ML Sopn Generic drug: Semaglutide(0.25 or 0.5MG/DOS) Inject 0.5 mg into the skin once a week.   pantoprazole 40 MG tablet Commonly known as: PROTONIX Take 1 tablet (40 mg total) by mouth daily. For acid reflux   rosuvastatin 40 MG tablet Commonly known as: CRESTOR Take 1 tablet (40 mg total) by mouth daily. For high cholesterol   sertraline 100 MG tablet Commonly known as: ZOLOFT Take 1 tablet (100 mg total) by mouth daily. For depression   triamcinolone cream 0.1 % Commonly known as: KENALOG Apply 1 application topically 2 (two) times daily as needed (itching).         ALLERGIES: No Known Allergies   REVIEW OF SYSTEMS: A comprehensive ROS was conducted with the patient and is negative except as per HPI and below:  ROS    OBJECTIVE:   VITAL SIGNS: There were no vitals taken for this visit.   PHYSICAL EXAM:  General: Pt appears well and is in NAD  Neck: General: Supple without adenopathy or carotid bruits. Thyroid: Thyroid size normal.  No goiter or nodules appreciated.   Lungs: Clear with good BS bilat with no rales, rhonchi, or wheezes  Heart: RRR with normal S1 and S2 and no gallops; no murmurs; no rub  Abdomen: Normoactive bowel sounds, soft, nontender, without masses or organomegaly palpable  Extremities:  Lower extremities - No pretibial edema. No lesions.  Neuro: MS is good with appropriate affect, pt is alert and Ox3    DM foot exam: 03/20/2022  The skin of the feet is without sores or ulcerations, toe nails thickened , long and curved  The pedal pulses are 2+ on right and 2+ on left. The sensation is intact to a screening  5.07, 10 gram monofilament bilaterally   DATA REVIEWED:  Lab Results  Component Value Date   HGBA1C 7.8 (A) 03/20/2022   HGBA1C 9.4 (H) 10/30/2019   HGBA1C 9.7 (H) 06/25/2019    Latest Reference Range & Units 03/20/22 12:30  Sodium 135 - 145 mEq/L 138  Potassium 3.5 - 5.1 mEq/L 4.8  Chloride 96 - 112 mEq/L 104  CO2 19 - 32 mEq/L 23  Glucose 70 - 99 mg/dL 166 (H)  BUN 6 - 23 mg/dL 22  Creatinine 0.40 - 1.50 mg/dL 1.28  Calcium 8.4 - 10.5 mg/dL 9.1  GFR >60.00 mL/min 59.46 (L)    Latest Reference Range & Units 03/20/22 12:30  Total CHOL/HDL Ratio  4  Cholesterol 0 - 200 mg/dL 136  HDL Cholesterol >39.00 mg/dL 37.50 (L)  LDL (calc) 0 - 99 mg/dL 60  MICROALB/CREAT RATIO 0.0 - 30.0 mg/g 2.3  NonHDL  98.20  Triglycerides 0.0 - 149.0 mg/dL 189.0 (H)  VLDL 0.0 - 40.0 mg/dL 37.8    Latest Reference Range & Units 03/20/22 12:30  TSH 0.35 - 5.50 uIU/mL 1.57  T4,Free(Direct) 0.60 - 1.60 ng/dL 0.66     Latest Reference Range & Units 03/20/22 12:30  Creatinine,U mg/dL 150.0  Microalb, Ur 0.0 - 1.9 mg/dL 3.4 (H)  MICROALB/CREAT RATIO 0.0 - 30.0 mg/g 2.3  (H): Data is abnormally high     ASSESSMENT / PLAN / RECOMMENDATIONS:   1) Type 1 Diabetes Mellitus, Sub-Optimally controlled, With Neuropathic and macrovascular  complications - Most recent A1c of 7.8 %. Goal A1c < 7.0 %.     GENERAL: He is intolerant to a small dose of metformin, will discontinue We discussed adding GLP-1 agonist, patient interested in weight loss, cautioned against GI side effects  MEDICATIONS: Stop metformin Start Ozempic 0.25 mg weekly, after 6 weeks increase to 0.5 mg weekly NovoLog  EDUCATION / INSTRUCTIONS: BG monitoring instructions: Patient is instructed to check his blood sugars 3 times a day, before meals. Call [facility] if: BG persistently < 70  I reviewed the Rule of 15 for the treatment of hypoglycemia in detail with the patient. Literature supplied.   2) Diabetic  complications:  Eye: Does not have known diabetic retinopathy.  Neuro/ Feet: Does  have known diabetic peripheral neuropathy. Renal: Patient does not have known baseline CKD. He is on an ACEI/ARB at present.   3) Dyslipidemia:   -LDL at goal  Medication Continue rosuvastatin 40 mg daily    4) Peripheral Neuropathy:  -Patient will be referred to podiatry -He is on gabapentin   Follow-up in 4 months   Signed electronically by: Mack Guise, MD  [facility]  [facility] [address] [place], [place] [address] Phone: [phone] FAX: [phone]   CC: Shawnee Knapp, MD [place] [address] Phone: [phone]  Fax: [phone]    Return to Endocrinology clinic as below: Future Appointments  Date Time Provider [place]  08/07/2022  1:40 PM Florine Sprenkle, Melanie Crazier, MD [id number] None

## 2022-08-11 ENCOUNTER — Encounter: Payer: Self-pay | Admitting: Internal Medicine

## 2022-08-11 ENCOUNTER — Ambulatory Visit: Payer: Medicare HMO | Admitting: Internal Medicine

## 2022-08-11 VITALS — BP 110/74 | HR 60 | Ht 69.0 in | Wt 256.0 lb

## 2022-08-11 DIAGNOSIS — E1042 Type 1 diabetes mellitus with diabetic polyneuropathy: Secondary | ICD-10-CM

## 2022-08-11 DIAGNOSIS — E1065 Type 1 diabetes mellitus with hyperglycemia: Secondary | ICD-10-CM

## 2022-08-11 DIAGNOSIS — E1059 Type 1 diabetes mellitus with other circulatory complications: Secondary | ICD-10-CM

## 2022-08-11 LAB — POCT GLYCOSYLATED HEMOGLOBIN (HGB A1C): Hemoglobin A1C: 7.4 % — AB (ref 4.0–5.6)

## 2022-08-11 MED ORDER — NOVOLOG FLEXPEN 100 UNIT/ML ~~LOC~~ SOPN: PEN_INJECTOR | SUBCUTANEOUS | 3 refills | Status: DC

## 2022-08-11 MED ORDER — BD PEN NEEDLE MICRO U/F 32G X 6 MM MISC: 1.0000 | Freq: Four times a day (QID) | 3 refills | Status: DC

## 2022-08-11 MED ORDER — SEMAGLUTIDE (1 MG/DOSE) 4 MG/3ML ~~LOC~~ SOPN: 1.0000 mg | PEN_INJECTOR | SUBCUTANEOUS | 3 refills | Status: DC

## 2022-08-11 MED ORDER — LANTUS SOLOSTAR 100 UNIT/ML ~~LOC~~ SOPN: 36.0000 [IU] | PEN_INJECTOR | Freq: Two times a day (BID) | SUBCUTANEOUS | 3 refills | Status: DC

## 2022-08-11 NOTE — Patient Instructions (Signed)
-   Increase  Ozempic 1 mg weekly  -Take  Lantus 36 units twice daily  - Novolog 16 units with each meal  -Novolog correctional insulin: ADD extra units on insulin to your meal-time Novolog dose if your blood sugars are higher than 150. Use the scale below to help guide you:   Blood sugar before meal Number of units to inject  Less than 150 0 unit  151 -  170 1 units  171 -  190 2 units  191 -  210 3 units  211 -  230 4 units  231 -  250 5 units  251 -  270 6 units  271 -  290 7 units  291 -  310 8 units   HOW TO TREAT LOW BLOOD SUGARS (Blood sugar LESS THAN 70 MG/DL) Please follow the RULE OF 15 for the treatment of hypoglycemia treatment (when your (blood sugars are less than 70 mg/dL)   STEP 1: Take 15 grams of carbohydrates when your blood sugar is low, which includes:  3-4 GLUCOSE TABS  OR 3-4 OZ OF JUICE OR REGULAR SODA OR ONE TUBE OF GLUCOSE GEL    STEP 2: RECHECK blood sugar in 15 MINUTES STEP 3: If your blood sugar is still low at the 15 minute recheck --> then, go back to STEP 1 and treat AGAIN with another 15 grams of carbohydrates.

## 2022-08-11 NOTE — Progress Notes (Signed)
Name: ROMELLO HOEHN  MRN/ DOB: [id number], 1956/08/29   Age/ Sex: 66 y.o., male    PCP: Rosemary Holms, NP   Reason for Endocrinology Evaluation: Type 1 Diabetes Mellitus     Date of Initial Endocrinology Visit: 03/20/2022    PATIENT IDENTIFIER: Mr. GUNTHER ZAWADZKI is a 66 y.o. male with a past medical history of T2DM, CAD, CVA. The patient presented for initial endocrinology clinic visit on 03/20/2022 for consultative assistance with his diabetes management.    HPI: Mr. Mccort was    Diagnosed with DM in 1990's  Prior Medications tried/Intolerance: intolerant to higher doses of metformin  Hemoglobin A1c has ranged from 7.7% in 2017, peaking at >14.0% in 2014.  Has Hx of multiple DKA's in the past   No Hx of Pancreatitis  Has diarrhea with metformin but no nausea or vomiting  Has tingling and numbness of legs    On his initial visit to our clinic he had an A1c of 7.8%, he already had OmniPod pump but he was not trained on it.  He was referred to our CDE for training at the time   SUBJECTIVE:   During the last visit (03/20/2022): A1c 7.8%  Today (08/11/22): Mr. Laverdiere is here for follow-up on diabetes management.  He checks his blood sugars 3-4 times a day. .  The patient has  had hypoglycemic episodes, he is symptomatic with these episodes.   Denies nausea, vomiting or diarrhea      HOME DIABETES REGIMEN: Ozempic 0.5 mg weekly NovoLog 20 units TIQAC Lantus 40 units daily       Statin: yes ACE-I/ARB: yes     DIABETIC COMPLICATIONS: Microvascular complications:  Neuropathy Denies: CKD Last eye exam: Completed 2023  Macrovascular complications:  CAD, CVA Denies:  PVD   PAST HISTORY: Past Medical History:  Past Medical History:  Diagnosis Date   Anxiety    Barrett's esophagus    CAD (coronary artery disease)    a. NSTEMI 05/2014 - occluded RCA dominant proximal s/p asp-thrombectomy/DES to RCA, minimal LAD/LCx, EF 50% by cath, 65-70% by echo.    Depression    Diabetes type 2, uncontrolled    Former tobacco use    GERD (gastroesophageal reflux disease)    Hemorrhoids    hx of   Hypercholesterolemia    Hypertension    MI (myocardial infarction) ([place])    Peripheral neuropathy    Past Surgical History:  Past Surgical History:  Procedure Laterality Date   LEFT HEART CATHETERIZATION WITH CORONARY ANGIOGRAM N/A 06/14/2014   Procedure: LEFT HEART CATHETERIZATION WITH CORONARY ANGIOGRAM;  Surgeon: Jettie Booze, MD;  Location: [facility] CATH LAB;  Service: Cardiovascular;  Laterality: N/A;   TONSILLECTOMY      Social History:  reports that he quit smoking about 23 years ago. His smoking use included cigarettes. He has a 0.75 pack-year smoking history. He has never used smokeless tobacco. He reports that he does not drink alcohol and does not use drugs. Family History:  Family History  Problem Relation Age of Onset   Hypertension Mother    Alzheimer's disease Mother    Alcohol abuse Father    Cancer Father 40       "Throat"   Diabetes type II Brother    Cancer Brother 75       throat cancer   Heart disease Neg Hx      HOME MEDICATIONS: Allergies as of 08/11/2022   No Known Allergies      Medication  List        Accurate as of August 11, 2022  2:04 PM. If you have any questions, ask your nurse or doctor.          aspirin EC 81 MG tablet Commonly known as: EQ Aspirin Adult Low Dose Take 1 tablet (81 mg total) by mouth daily. Swallow whole: For heart health   gabapentin 300 MG capsule Commonly known as: NEURONTIN Take 1 capsule (300 mg total) by mouth 2 (two) times daily. For agitation/neuropathic pain   HYDROcodone-acetaminophen 5-325 MG tablet Commonly known as: NORCO/VICODIN Take 1 tablet by mouth every 12 (twelve) hours as needed for moderate pain.   hydrocortisone cream 1 % Apply 1 application topically 2 (two) times daily as needed for itching.   hydrOXYzine 25 MG tablet Commonly known as:  ATARAX Take 1 tablet (25 mg total) by mouth 3 (three) times daily as needed for anxiety.   ibuprofen 600 MG tablet Commonly known as: ADVIL Take 1 tablet (600 mg total) by mouth every 6 (six) hours as needed for fever, headache or mild pain. (May buy from over the counter): For pain   ID Now COVID-19 Kit Generic drug: COVID-19 Test   insulin aspart 100 UNIT/ML injection Commonly known as: novoLOG Max daily 100   insulin glargine 100 UNIT/ML injection Commonly known as: LANTUS Inject 80 Units into the skin 2 (two) times daily. 40am and 40 at bde time   lisinopril 40 MG tablet Commonly known as: ZESTRIL Take 1 tablet (40 mg total) by mouth daily. For high blood pressure   metoprolol tartrate 50 MG tablet Commonly known as: LOPRESSOR Take 1 tablet (50 mg total) by mouth 2 (two) times daily. For high blood pressure   nitroGLYCERIN 0.4 MG SL tablet Commonly known as: NITROSTAT Place 1 tablet (0.4 mg total) under the tongue every 5 (five) minutes as needed for chest pain (up to 3 doses).   olmesartan-hydrochlorothiazide 40-25 MG tablet Commonly known as: BENICAR HCT Take 1 tablet by mouth daily.   Ozempic (0.25 or 0.5 MG/DOSE) 2 MG/3ML Sopn Generic drug: Semaglutide(0.25 or 0.5MG/DOS) Inject 0.5 mg into the skin once a week.   pantoprazole 40 MG tablet Commonly known as: PROTONIX Take 1 tablet (40 mg total) by mouth daily. For acid reflux   rosuvastatin 40 MG tablet Commonly known as: CRESTOR Take 1 tablet (40 mg total) by mouth daily. For high cholesterol   sertraline 100 MG tablet Commonly known as: ZOLOFT Take 1 tablet (100 mg total) by mouth daily. For depression   triamcinolone cream 0.1 % Commonly known as: KENALOG Apply 1 application topically 2 (two) times daily as needed (itching).         ALLERGIES: No Known Allergies   REVIEW OF SYSTEMS: A comprehensive ROS was conducted with the patient and is negative except as per HPI and below:  ROS     OBJECTIVE:   VITAL SIGNS: BP 110/74 (BP Location: Left Arm, Patient Position: Sitting, Cuff Size: Large)   Pulse 60   Ht 5' 9"  (1.753 m)   Wt 256 lb (116.1 kg)   SpO2 98%   BMI 37.80 kg/m    PHYSICAL EXAM:  General: Pt appears well and is in NAD  Neck: General: Supple without adenopathy or carotid bruits. Thyroid: Thyroid size normal.  No goiter or nodules appreciated.   Lungs: Clear with good BS bilat with no rales, rhonchi, or wheezes  Heart: RRR with normal S1 and S2 and no gallops; no murmurs;  no rub  Abdomen: Normoactive bowel sounds, soft, nontender, without masses or organomegaly palpable  Extremities:  Lower extremities - No pretibial edema. No lesions.  Neuro: MS is good with appropriate affect, pt is alert and Ox3    DM foot exam: 03/20/2022  The skin of the feet is without sores or ulcerations, toe nails thickened , long and curved  The pedal pulses are 2+ on right and 2+ on left. The sensation is intact to a screening 5.07, 10 gram monofilament bilaterally   DATA REVIEWED:  Lab Results  Component Value Date   HGBA1C 7.8 (A) 03/20/2022   HGBA1C 9.4 (H) 10/30/2019   HGBA1C 9.7 (H) 06/25/2019    Latest Reference Range & Units 03/20/22 12:30  Sodium 135 - 145 mEq/L 138  Potassium 3.5 - 5.1 mEq/L 4.8  Chloride 96 - 112 mEq/L 104  CO2 19 - 32 mEq/L 23  Glucose 70 - 99 mg/dL 166 (H)  BUN 6 - 23 mg/dL 22  Creatinine 0.40 - 1.50 mg/dL 1.28  Calcium 8.4 - 10.5 mg/dL 9.1  GFR >60.00 mL/min 59.46 (L)    Latest Reference Range & Units 03/20/22 12:30  Total CHOL/HDL Ratio  4  Cholesterol 0 - 200 mg/dL 136  HDL Cholesterol >39.00 mg/dL 37.50 (L)  LDL (calc) 0 - 99 mg/dL 60  MICROALB/CREAT RATIO 0.0 - 30.0 mg/g 2.3  NonHDL  98.20  Triglycerides 0.0 - 149.0 mg/dL 189.0 (H)  VLDL 0.0 - 40.0 mg/dL 37.8    Latest Reference Range & Units 03/20/22 12:30  TSH 0.35 - 5.50 uIU/mL 1.57  T4,Free(Direct) 0.60 - 1.60 ng/dL 0.66     Latest Reference Range & Units  03/20/22 12:30  Creatinine,U mg/dL 150.0  Microalb, Ur 0.0 - 1.9 mg/dL 3.4 (H)  MICROALB/CREAT RATIO 0.0 - 30.0 mg/g 2.3       ASSESSMENT / PLAN / RECOMMENDATIONS:   1) Type 1 Diabetes Mellitus, Sub-Optimally controlled, With Neuropathic and macrovascular  complications - Most recent A1c of 7.4 %. Goal A1c < 7.0 %.     - His A1c has been trending down but continues to be above goal -He is tolerating Ozempic, will increase -He was on the OmniPod at some point but due to cost prohibition, he switched back to the MDI regimen.  A few days ago he put the insulin pump back on because he ran out of NovoLog pens and since he has the vials he started using the pump.  I did advise the patient not to mix pump use with multiple daily injections, as this will create hypoglycemic episodes due to the continuous insulin infusion through the pump as well as Lantus in the background -I did explain to the patient that he needs to make a long-term decision regarding affordability of CGM/pump but I do not recommend switching back and forth -I am going to reduce his insulin doses as below as well as increasing Ozempic -Intolerant to metformin   MEDICATIONS: Increase Ozempic 1 mg weekly Decrease Lantus 36 units twice daily Decrease NovoLog 16 units before every meal 3 times daily Correction factor : NovoLog (BG -130/20)  EDUCATION / INSTRUCTIONS: BG monitoring instructions: Patient is instructed to check his blood sugars 3 times a day, before meals. Call [facility] if: BG persistently < 70  I reviewed the Rule of 15 for the treatment of hypoglycemia in detail with the patient. Literature supplied.   2) Diabetic complications:  Eye: Does not have known diabetic retinopathy.  Neuro/ Feet: Does  have known diabetic peripheral neuropathy. Renal: Patient does not have known baseline CKD. He is on an ACEI/ARB at present.      Follow-up in 6 months   Signed electronically by: Mack Guise, MD  [facility]  [address] [place], [place] [address] Phone: [phone] FAX: [phone]   CC: Rosemary Holms, NP No address on file Phone: None  Fax: None    Return to Endocrinology clinic as below: No future appointments.

## 2022-11-09 ENCOUNTER — Other Ambulatory Visit: Payer: Self-pay

## 2022-11-09 MED ORDER — OMNIPOD 5 DEXG7G6 PODS GEN 5 MISC: 2 refills | Status: DC

## 2022-11-18 ENCOUNTER — Telehealth: Payer: Self-pay | Admitting: Internal Medicine

## 2022-11-18 NOTE — Telephone Encounter (Signed)
Patient is calling saying that he needs the prescription for his Dexcom G6 supplies sent to another supplier.  Patient states that he is now using [employer].

## 2022-11-18 NOTE — Telephone Encounter (Signed)
Order has been submitted to [facility] on [address]

## 2022-12-16 ENCOUNTER — Ambulatory Visit: Payer: Medicare HMO | Admitting: Nutrition

## 2022-12-22 ENCOUNTER — Telehealth: Payer: Self-pay | Admitting: Nutrition

## 2022-12-22 NOTE — Telephone Encounter (Signed)
Confirmation for upcoming pump training appointment

## 2022-12-23 ENCOUNTER — Encounter: Payer: Medicare HMO | Attending: Internal Medicine | Admitting: Nutrition

## 2022-12-23 ENCOUNTER — Telehealth: Payer: Self-pay | Admitting: Nutrition

## 2022-12-23 DIAGNOSIS — E1065 Type 1 diabetes mellitus with hyperglycemia: Secondary | ICD-10-CM | POA: Diagnosis present

## 2022-12-23 NOTE — Progress Notes (Signed)
Patient says that his insurance will cover his pods and that he is ready to go back on his OmniPod 5.  He stopped this when his PDM was recalled and his insurance was costing "a lot of money for the pods".  Settings were put in per last office note while he was on this on 5/ 03/23.  Basal rate: 2.75u/hr, ISF: 1( 10 ac all meals),  ISF: 20, target: 110 with correction over 110, timing 4 hours.  He filled a pod with Novolog insulin and attached it to his right abdomen without difficulty, and we reviewed how to bolus and how to do correction doses as needed whenever his blood sugar goes over 250. He reports having had 2 cortisone injections this last week, one last Friday in his hip, and another on Monday in his shoulder.  Blood sugar was now 331 when he came in and was 371 when he left.  He reports having not taken any insulin for lunch because blood sugar was 142 before he ate a salad.  I stressed need to take insulin before all meals, and he reported good understanding of this.  Pt says he would like the U200 insulin that would allow him to wear a pod for 2 to 3 days in stead of changing this every day.  I will send the request to Dr. Kelton Pillar for him. He had no final questions.

## 2022-12-23 NOTE — Telephone Encounter (Signed)
Patient is requesting to try the U 200 insulin so he will not have to change his pods every 1-2 days

## 2022-12-23 NOTE — Patient Instructions (Signed)
Take insulin before all meals and snacks  Do a correction dose on all blood sugars over 250.

## 2022-12-24 MED ORDER — HUMALOG KWIKPEN 200 UNIT/ML ~~LOC~~ SOPN: PEN_INJECTOR | SUBCUTANEOUS | 3 refills | Status: DC

## 2022-12-29 NOTE — Telephone Encounter (Signed)
Appointment scheduled for Monday to change his PDM settings.  Do you want me to just 1/2 the basal rate amount and double to I/C and sensitivity factor?

## 2022-12-29 NOTE — Telephone Encounter (Signed)
I called patient to schedule appointment and he says his PDM stopped working on Monday.  I have no appointments, but told him he could call customer assistance and start on his PDM using U100 insulin, or wait until Monday, and I set his PDM up to use the U200 insulin.  He agreed to come in on Monday

## 2023-01-04 ENCOUNTER — Encounter: Payer: Medicare HMO | Attending: Internal Medicine | Admitting: Nutrition

## 2023-01-04 DIAGNOSIS — E1042 Type 1 diabetes mellitus with diabetic polyneuropathy: Secondary | ICD-10-CM | POA: Diagnosis present

## 2023-01-04 NOTE — Patient Instructions (Signed)
Take 1/2 the amount of meal time insulin.   Call if questions or blood sugars dropping low, or remaining over 225.

## 2023-01-04 NOTE — Progress Notes (Signed)
Patient is here to change his insulin from Humalog u-100 to U-200.  Basal rate was reduced to have the amount.  Currently using 2.75, and this was reduced to 1.35 due to readings in the 70s and low 80s fasting.  He is not counting carbs, and says he is giving 20u for each meal.  He was told to decrease this to 10u.  He agreed to do this. The ISF was set at 20, and this was changed to 40.  Target is still set at 110 with corrections over 110 and timing is still at 4 hours.  He filled a pod with U-200 insulin and pump was started at 10:45AM.  He says he took Lantus yesterday at 10AM, because his PDM had malfunctioned again. He was reminded to take no more Lantus, and he reverbalised this as understanding this.  He had no final questions.

## 2023-03-24 ENCOUNTER — Other Ambulatory Visit: Payer: Self-pay | Admitting: Internal Medicine

## 2023-04-18 ENCOUNTER — Other Ambulatory Visit: Payer: Self-pay | Admitting: Internal Medicine

## 2023-06-13 ENCOUNTER — Other Ambulatory Visit: Payer: Self-pay | Admitting: Internal Medicine

## 2024-06-07 ENCOUNTER — Ambulatory Visit (INDEPENDENT_AMBULATORY_CARE_PROVIDER_SITE_OTHER): Admitting: Internal Medicine

## 2024-06-07 ENCOUNTER — Encounter: Payer: Self-pay | Admitting: Internal Medicine

## 2024-06-07 VITALS — BP 122/80 | HR 88 | Ht 69.0 in | Wt 256.0 lb

## 2024-06-07 DIAGNOSIS — E1065 Type 1 diabetes mellitus with hyperglycemia: Secondary | ICD-10-CM | POA: Diagnosis not present

## 2024-06-07 DIAGNOSIS — E1059 Type 1 diabetes mellitus with other circulatory complications: Secondary | ICD-10-CM

## 2024-06-07 DIAGNOSIS — E1042 Type 1 diabetes mellitus with diabetic polyneuropathy: Secondary | ICD-10-CM | POA: Diagnosis not present

## 2024-06-07 DIAGNOSIS — E785 Hyperlipidemia, unspecified: Secondary | ICD-10-CM

## 2024-06-07 LAB — POCT GLYCOSYLATED HEMOGLOBIN (HGB A1C): Hemoglobin A1C: 7.4 % — AB (ref 4.0–5.6)

## 2024-06-07 MED ORDER — OZEMPIC (0.25 OR 0.5 MG/DOSE) 2 MG/3ML ~~LOC~~ SOPN: 0.5000 mg | PEN_INJECTOR | SUBCUTANEOUS | 3 refills | Status: DC

## 2024-06-07 NOTE — Progress Notes (Unsigned)
 Name: [name] [name]  MRN/ DOB: [id number], 11/28/1956   Age/ Sex: 68 y.o., male    PCP: Lennie Railing, NP (Inactive)   Reason for Endocrinology Evaluation: Type 1 Diabetes Mellitus     Date of Initial Endocrinology Visit: 03/20/2022    PATIENT IDENTIFIER: [name] [name] is a 68 y.o. male with a past medical history of T2DM, CAD, CVA. The patient presented for initial endocrinology clinic visit on 03/20/2022 for consultative assistance with his diabetes management.    HPI: [name] [name] was    Diagnosed with DM in 1990's  Prior Medications tried/Intolerance: intolerant to higher doses of metformin   Hemoglobin A1c has ranged from 7.7% in 2017, peaking at >14.0% in 2014.  Has Hx of multiple DKA's in the past   No Hx of Pancreatitis  Has diarrhea with metformin  but no nausea or vomiting  Has tingling and numbness of legs    On his initial visit to our clinic he had an A1c of 7.8%, he already had OmniPod pump but he was not trained on it.  He was referred to our CDE for training at the time   Patient transition care to [facility] from 2023 until his return to our clinic in 2025  SUBJECTIVE:   During the last visit (03/20/2022): A1c 7.8%     Today (06/07/24): [name] [name] is here for follow-up on diabetes management.  He has not been to our clinic in 2 years.  He checks his blood sugars 3-4 times a day.  Through the Dexcom..  The patient has  had hypoglycemic episodes, he is symptomatic with these episodes.   Denies nausea, vomiting Has occasional constipation but no diarrhea  Patient is complaining of weight gain since he has been off of Ozempic  He also has been using multiple Pods   HOME DIABETES REGIMEN: Humalog      This patient with type 1 diabetes is treated with OmniPod (insulin  pump). During the visit the pump basal and bolus doses were reviewed including carb/insulin  rations and supplemental doses. The clinical list was updated. The glucose meter download  was reviewed in detail to determine if the current pump settings are providing the best glycemic control without excessive hypoglycemia.    Pump and meter download:    Pump   OmniPod Settings   Insulin  type   Humalog    Basal rate       0000 1.25u/h               I:C ratio       0000 1:1          Sensitivity       0000  30      Goal       0000              Type & Model of Pump: OmniPod Insulin  Type: Currently using Humalog .  Body mass index is 37.8 kg/m.  PUMP STATISTICS: Average BG: 167  Average Daily Carbs (g): 24.5  Average Total Daily Insulin : 52.8  Average Daily Basal: 29.7 (56 %) Average Daily Bolus: 44 (23.1 %)  CONTINUOUS GLUCOSE MONITORING RECORD INTERPRETATION    Dates of Recording: 6/26-06/07/2024  Sensor description:dexcom G7  Results statistics:   CGM use % of time 60.8  Average and SD 167/70  Time in range     64   %  % Time Above 180 21  % Time above 250 13  % Time Below target 2   Glycemic patterns summary: BGs  are optimal overnight and increased throughout the day  Hyperglycemic episodes postprandial  Hypoglycemic episodes occurred during the day, rare  Overnight periods: Optimal   Statin: yes ACE-I/ARB: yes     DIABETIC COMPLICATIONS: Microvascular complications:  Neuropathy Denies: CKD Last eye exam: Completed 2023  Macrovascular complications:  CAD, CVA Denies:  PVD   PAST HISTORY: Past Medical History:  Past Medical History:  Diagnosis Date   Anxiety    Barrett's esophagus    CAD (coronary artery disease)    a. NSTEMI 05/2014 - occluded RCA dominant proximal s/p asp-thrombectomy/DES to RCA, minimal LAD/LCx, EF 50% by cath, 65-70% by echo.   Depression    Diabetes type 2, uncontrolled    Former tobacco use    GERD (gastroesophageal reflux disease)    Hemorrhoids    hx of   Hypercholesterolemia    Hypertension    MI (myocardial infarction) (HCC)    Peripheral neuropathy    Past Surgical History:   Past Surgical History:  Procedure Laterality Date   LEFT HEART CATHETERIZATION WITH CORONARY ANGIOGRAM N/A 06/14/2014   Procedure: LEFT HEART CATHETERIZATION WITH CORONARY ANGIOGRAM;  Surgeon: Candyce GORMAN Reek, MD;  Location: [facility] CATH LAB;  Service: Cardiovascular;  Laterality: N/A;   TONSILLECTOMY      Social History:  reports that he quit smoking about 25 years ago. His smoking use included cigarettes. He started smoking about 28 years ago. He has a 0.8 pack-year smoking history. He has never used smokeless tobacco. He reports that he does not drink alcohol and does not use drugs. Family History:  Family History  Problem Relation Age of Onset   Hypertension Mother    Alzheimer's disease Mother    Alcohol abuse Father    Cancer Father 37       Throat   Diabetes type II Brother    Cancer Brother 68       throat cancer   Heart disease Neg Hx      HOME MEDICATIONS: Allergies as of 06/07/2024   No Known Allergies      Medication List        Accurate as of June 07, 2024  2:56 PM. If you have any questions, ask your nurse or doctor.          aspirin  EC 81 MG tablet Commonly known as: EQ Aspirin  Adult Low Dose Take 1 tablet (81 mg total) by mouth daily. Swallow whole: For heart health   BD Pen Needle Micro U/F 32G X 6 MM Misc Generic drug: Insulin  Pen Needle 1 Device by Does not apply route in the morning, at noon, in the evening, and at bedtime.   buPROPion  300 MG 24 hr tablet Commonly known as: WELLBUTRIN  XL Take 300 mg by mouth daily.   ezetimibe  10 MG tablet Commonly known as: ZETIA  Take 10 mg by mouth daily.   gabapentin  300 MG capsule Commonly known as: NEURONTIN  Take 1 capsule (300 mg total) by mouth 2 (two) times daily. For agitation/neuropathic pain   HumaLOG  KwikPen 200 UNIT/ML KwikPen Generic drug: insulin  lispro INJECT MAX DAILY 100 UNITS SUBCUTANEOUSLY   HYDROcodone -acetaminophen  5-325 MG tablet Commonly known as: NORCO/VICODIN Take 1 tablet by  mouth every 12 (twelve) hours as needed for moderate pain.   hydrocortisone  cream 1 % Apply 1 application topically 2 (two) times daily as needed for itching.   hydrOXYzine  25 MG tablet Commonly known as: ATARAX  Take 1 tablet (25 mg total) by mouth 3 (three) times daily as needed for  anxiety.   ibuprofen  600 MG tablet Commonly known as: ADVIL  Take 1 tablet (600 mg total) by mouth every 6 (six) hours as needed for fever, headache or mild pain. (May buy from over the counter): For pain   ID Now COVID-19 Kit Generic drug: COVID-19 Test   Lantus  SoloStar 100 UNIT/ML Solostar Pen Generic drug: insulin  glargine Inject 36 Units into the skin.   lisinopril  40 MG tablet Commonly known as: ZESTRIL  Take 1 tablet (40 mg total) by mouth daily. For high blood pressure   metoprolol  tartrate 50 MG tablet Commonly known as: LOPRESSOR  Take 1 tablet (50 mg total) by mouth 2 (two) times daily. For high blood pressure   Movantik 25 MG Tabs tablet Generic drug: naloxegol oxalate Take by mouth daily.   nitroGLYCERIN  0.4 MG SL tablet Commonly known as: NITROSTAT  Place 1 tablet (0.4 mg total) under the tongue every 5 (five) minutes as needed for chest pain (up to 3 doses).   olmesartan 20 MG tablet Commonly known as: BENICAR Take 20 mg by mouth daily.   olmesartan-hydrochlorothiazide 40-25 MG tablet Commonly known as: BENICAR HCT Take 1 tablet by mouth daily.   Omnipod 5 DexG7G6 Pods Gen 5 Misc CHANGE POD EVERY OTHER DAY   pantoprazole  40 MG tablet Commonly known as: PROTONIX  Take 1 tablet (40 mg total) by mouth daily. For acid reflux   rosuvastatin  40 MG tablet Commonly known as: CRESTOR  Take 1 tablet (40 mg total) by mouth daily. For high cholesterol   Semaglutide  (1 MG/DOSE) 4 MG/3ML Sopn Inject 1 mg as directed once a week.   sertraline  100 MG tablet Commonly known as: ZOLOFT  Take 1 tablet (100 mg total) by mouth daily. For depression   triamcinolone cream 0.1 % Commonly  known as: KENALOG Apply 1 application topically 2 (two) times daily as needed (itching).         ALLERGIES: No Known Allergies   REVIEW OF SYSTEMS: A comprehensive ROS was conducted with the patient and is negative except as per HPI     OBJECTIVE:   VITAL SIGNS: BP 122/80 (BP Location: Left Arm, Patient Position: Sitting, Cuff Size: Normal)   Pulse 88   Ht 5' 9 (1.753 m)   Wt 256 lb (116.1 kg)   SpO2 96%   BMI 37.80 kg/m    PHYSICAL EXAM:  General: Pt appears well and is in NAD  Lungs: Clear with good BS bilat   Heart: RRR   Extremities:  Lower extremities - No pretibial edema. No lesions.  Neuro: MS is good with appropriate affect, pt is alert and Ox3    DM foot exam: 03/20/2022  The skin of the feet is without sores or ulcerations, toe nails thickened , long and curved  The pedal pulses are 2+ on right and 2+ on left. The sensation is intact to a screening 5.07, 10 gram monofilament bilaterally   DATA REVIEWED:  Lab Results  Component Value Date   HGBA1C 7.4 (A) 08/11/2022   HGBA1C 7.8 (A) 03/20/2022   HGBA1C 9.4 (H) 10/30/2019       ASSESSMENT / PLAN / RECOMMENDATIONS:   1) Type 1 Diabetes Mellitus, Sub-Optimally controlled, With Neuropathic and macrovascular  complications - Most recent A1c of 7.4 %. Goal A1c < 7.0 %.     -His A1c has been trending down but continues to be above goal -He is tolerating Ozempic , will increase -He was on the OmniPod at some point but due to cost prohibition, he switched back to  the MDI regimen.  A few days ago he put the insulin  pump back on because he ran out of NovoLog  pens and since he has the vials he started using the pump.  I did advise the patient not to mix pump use with multiple daily injections, as this will create hypoglycemic episodes due to the continuous insulin  infusion through the pump as well as Lantus  in the background -I did explain to the patient that he needs to make a long-term decision regarding  affordability of CGM/pump but I do not recommend switching back and forth -I am going to reduce his insulin  doses as below as well as increasing Ozempic  -Intolerant to metformin    MEDICATIONS: Increase Ozempic  1 mg weekly Decrease Lantus  36 units twice daily Decrease NovoLog  16 units before every meal 3 times daily Correction factor : NovoLog  (BG -130/20)  EDUCATION / INSTRUCTIONS: BG monitoring instructions: Patient is instructed to check his blood sugars 3 times a day, before meals. Call [facility] if: BG persistently < 70  I reviewed the Rule of 15 for the treatment of hypoglycemia in detail with the patient. Literature supplied.   2) Diabetic complications:  Eye: Does not have known diabetic retinopathy.  Neuro/ Feet: Does  have known diabetic peripheral neuropathy. Renal: Patient does not have known baseline CKD. He is on an ACEI/ARB at present.      Follow-up in 6 months   Signed electronically by: Stefano Redgie Butts, MD  [facility] End[facility]  [facility] [address] [place], [place] [address] Phone: [phone] FAX: [phone]   CC: Lennie Railing, NP (Inactive) No address on file Phone: None  Fax: None    Return to Endocrinology clinic as below: No future appointments.

## 2024-06-07 NOTE — Patient Instructions (Signed)
  Start Ozempic 0.25 mg once weekly for 6 weeks, then increase to 0.5 mg weekly    -HOW TO TREAT LOW BLOOD SUGARS (Blood sugar LESS THAN 70 MG/DL) Please follow the RULE OF 15 for the treatment of hypoglycemia treatment (when your (blood sugars are less than 70 mg/dL)   STEP 1: Take 15 grams of carbohydrates when your blood sugar is low, which includes:  3-4 GLUCOSE TABS  OR 3-4 OZ OF JUICE OR REGULAR SODA OR ONE TUBE OF GLUCOSE GEL    STEP 2: RECHECK blood sugar in 15 MINUTES STEP 3: If your blood sugar is still low at the 15 minute recheck --> then, go back to STEP 1 and treat AGAIN with another 15 grams of carbohydrates.

## 2024-06-08 ENCOUNTER — Encounter: Payer: Self-pay | Admitting: Internal Medicine

## 2024-06-08 ENCOUNTER — Telehealth: Payer: Self-pay | Admitting: Pharmacy Technician

## 2024-06-08 ENCOUNTER — Other Ambulatory Visit (HOSPITAL_COMMUNITY): Payer: Self-pay

## 2024-06-08 ENCOUNTER — Ambulatory Visit: Payer: Self-pay | Admitting: Internal Medicine

## 2024-06-08 LAB — TSH: TSH: 1 m[IU]/L (ref 0.40–4.50)

## 2024-06-08 LAB — BASIC METABOLIC PANEL WITH GFR
BUN/Creatinine Ratio: 15 (calc) (ref 6–22)
BUN: 23 mg/dL (ref 7–25)
CO2: 23 mmol/L (ref 20–32)
Calcium: 9.4 mg/dL (ref 8.6–10.3)
Chloride: 106 mmol/L (ref 98–110)
Creat: 1.55 mg/dL — ABNORMAL HIGH (ref 0.70–1.35)
Glucose, Bld: 353 mg/dL — ABNORMAL HIGH (ref 65–139)
Potassium: 4.8 mmol/L (ref 3.5–5.3)
Sodium: 137 mmol/L (ref 135–146)
eGFR: 49 mL/min/1.73m2 — ABNORMAL LOW (ref >=60)

## 2024-06-08 LAB — MICROALBUMIN / CREATININE URINE RATIO
Creatinine, Urine: 80 mg/dL (ref 20–320)
Microalb, Ur: <0.2 mg/dL

## 2024-06-08 LAB — LIPID PANEL
Cholesterol: 110 mg/dL (ref <200)
HDL: 46 mg/dL (ref >=40)
LDL Cholesterol (Calc): 43 mg/dL
Non-HDL Cholesterol (Calc): 64 mg/dL (ref <130)
Total CHOL/HDL Ratio: 2.4 (calc) (ref <5.0)
Triglycerides: 128 mg/dL (ref <150)

## 2024-06-08 MED ORDER — INSULIN LISPRO 100 UNIT/ML IJ SOLN: INTRAMUSCULAR | 6 refills | Status: AC

## 2024-06-08 MED ORDER — OMNIPOD 5 G7 PODS (GEN 5) MISC: 1.0000 | 3 refills | Status: AC

## 2024-06-08 MED ORDER — DEXCOM G7 SENSOR MISC
1.0000 | 3 refills | Status: AC
Start: 2024-06-08 — End: ?

## 2024-06-08 MED ORDER — INSULIN PEN NEEDLE 32G X 4 MM MISC
1.0000 | Freq: Three times a day (TID) | 0 refills | Status: DC
Start: 2024-06-08 — End: 2024-10-09

## 2024-06-08 MED ORDER — INSULIN LISPRO (1 UNIT DIAL) 100 UNIT/ML (KWIKPEN): PEN_INJECTOR | SUBCUTANEOUS | 0 refills | Status: AC

## 2024-06-08 NOTE — Telephone Encounter (Signed)
 Pharmacy Patient Advocate Encounter   Received notification from CoverMyMeds that prior authorization for Ozempic  (0.25 or 0.5 MG/DOSE) 2MG /3ML pen-injectors is required/requested.   Insurance verification completed.   The patient is insured through [facility] .   Per test claim: PA required; PA submitted to above mentioned insurance via CoverMyMeds Key/confirmation #/EOC [id number] Status is pending

## 2024-06-08 NOTE — Telephone Encounter (Signed)
 Pharmacy Patient Advocate Encounter  Received notification from [employer] that Prior Authorization for Ozempic  (0.25 or 0.5 MG/DOSE) 2MG /3ML pen-injectors has been APPROVED from 06/08/24 to 11/29/24. Unable to obtain price due to refill too soon rejection, last fill date 06/08/24 next available fill date 08/10/24   PA #/Case ID/Reference #: [id number]

## 2024-10-07 ENCOUNTER — Other Ambulatory Visit: Payer: Self-pay | Admitting: Internal Medicine

## 2024-10-11 ENCOUNTER — Telehealth (INDEPENDENT_AMBULATORY_CARE_PROVIDER_SITE_OTHER): Admitting: Internal Medicine

## 2024-10-11 ENCOUNTER — Telehealth: Payer: Self-pay | Admitting: Internal Medicine

## 2024-10-11 DIAGNOSIS — E1042 Type 1 diabetes mellitus with diabetic polyneuropathy: Secondary | ICD-10-CM | POA: Diagnosis not present

## 2024-10-11 DIAGNOSIS — E1059 Type 1 diabetes mellitus with other circulatory complications: Secondary | ICD-10-CM | POA: Diagnosis not present

## 2024-10-11 MED ORDER — SEMAGLUTIDE (1 MG/DOSE) 4 MG/3ML ~~LOC~~ SOPN
1.0000 mg | PEN_INJECTOR | SUBCUTANEOUS | 3 refills | Status: AC
Start: 2024-10-11 — End: ?

## 2024-10-11 NOTE — Progress Notes (Addendum)
 Virtual Visit via Video Note  I connected with [name] [name] on 10/11/24 at 10:10 AM EST by a video enabled telemedicine application and verified that I am speaking with the correct person using two identifiers.   I discussed the limitations of evaluation and management by telemedicine and the availability of in person appointments. The patient expressed understanding and agreed to proceed.   -Location of the patient : home  -Location of the provider : Office -The names of all persons participating in the telemedicine service : Pt and myself        Name: [name] [name]  MRN/ DOB: [id number], Jul 26, 1956   Age/ Sex: 68 y.o., male    PCP: Lennie Railing, NP (Inactive)   Reason for Endocrinology Evaluation: Type 1 Diabetes Mellitus     Date of Initial Endocrinology Visit: 03/20/2022    PATIENT IDENTIFIER: [name] is a 68 y.o. male with a past medical history of T2DM, CAD, CVA. The patient presented for initial endocrinology clinic visit on 03/20/2022 for consultative assistance with his diabetes management.    HPI: [name] [name] was    Diagnosed with DM in 1990's  Prior Medications tried/Intolerance: intolerant to higher doses of metformin   Hemoglobin A1c has ranged from 7.7% in 2017, peaking at >14.0% in 2014.  Has Hx of multiple DKA's in the past   No Hx of Pancreatitis  Has diarrhea with metformin  but no nausea or vomiting  Has tingling and numbness of legs    On his initial visit to our clinic he had an A1c of 7.8%, he already had OmniPod pump but he was not trained on it.  He was referred to our CDE for training at the time   Patient transition care to [facility] from 2023 until his return to our clinic in 2025  SUBJECTIVE:   During the last visit (06/07/2024): A1c 7.4%     Today (10/11/24): [name] is here for follow-up on diabetes management. He checks his blood sugars multiple times daily through the Dexcom..  The patient has  had hypoglycemic  episodes, he is symptomatic at times.  Patient follows with [facility] for chronic pain syndrome and bilateral hip pain.  The patient does endorse constipation, this is attributed to his pain medications, he was started on Movantik through his pain medicine doctor No nausea or vomiting Has noted weight loss while being on Ozempic    HOME DIABETES REGIMEN: Ozempic  0.5 mg weekly Humalog      This patient with type 1 diabetes is treated with OmniPod (insulin  pump). During the visit the pump basal and bolus doses were reviewed including carb/insulin  rations and supplemental doses. The clinical list was updated. The glucose meter download was reviewed in detail to determine if the current pump settings are providing the best glycemic control without excessive hypoglycemia.    Pump and meter download:    Pump   OmniPod Settings   Insulin  type   Humalog    Basal rate       0000 1.25u/h               I:C ratio       0000 1:1    Enters 15-18 g with each meal      Sensitivity       0000  30      Goal       0000              Type & Model of  Pump: OmniPod Insulin  Type: Currently using Humalog .  There is no height or weight on file to calculate BMI.  PUMP STATISTICS: Average BG: 153 Average Daily Carbs (g): 34 Average Total Daily Insulin : 69.9 Average Daily Basal: 35.3 (51 %) Average Daily Bolus: 34.6 (49 %)  CONTINUOUS GLUCOSE MONITORING RECORD INTERPRETATION    Dates of Recording: 10/30 - 10/11/2024  Sensor description:dexcom G7  Results statistics:   CGM use % of time 84  Average and SD 153/63  Time in range 62 %  % Time Above 180 23  % Time above 250 8  % Time Below target 5   Glycemic patterns summary: Patient has been noted with hypoglycemia overnight and high BGs during the day Hyperglycemic episodes postprandial  Hypoglycemic episodes occurred during the night  Overnight periods: Trends down   Statin: yes ACE-I/ARB:  yes     DIABETIC COMPLICATIONS: Microvascular complications:  Neuropathy Denies: CKD Last eye exam: Completed 2023  Macrovascular complications:  CAD, CVA Denies:  PVD   PAST HISTORY: Past Medical History:  Past Medical History:  Diagnosis Date   Anxiety    Barrett's esophagus    CAD (coronary artery disease)    a. NSTEMI 05/2014 - occluded RCA dominant proximal s/p asp-thrombectomy/DES to RCA, minimal LAD/LCx, EF 50% by cath, 65-70% by echo.   Depression    Diabetes type 2, uncontrolled    Former tobacco use    GERD (gastroesophageal reflux disease)    Hemorrhoids    hx of   Hypercholesterolemia    Hypertension    MI (myocardial infarction) (HCC)    Peripheral neuropathy    Past Surgical History:  Past Surgical History:  Procedure Laterality Date   LEFT HEART CATHETERIZATION WITH CORONARY ANGIOGRAM N/A 06/14/2014   Procedure: LEFT HEART CATHETERIZATION WITH CORONARY ANGIOGRAM;  Surgeon: Candyce GORMAN Reek, MD;  Location: [facility] CATH LAB;  Service: Cardiovascular;  Laterality: N/A;   TONSILLECTOMY      Social History:  reports that he quit smoking about 25 years ago. His smoking use included cigarettes. He started smoking about 28 years ago. He has a 0.8 pack-year smoking history. He has never used smokeless tobacco. He reports that he does not drink alcohol and does not use drugs. Family History:  Family History  Problem Relation Age of Onset   Hypertension Mother    Alzheimer's disease Mother    Alcohol abuse Father    Cancer Father 23       Throat   Diabetes type II Brother    Cancer Brother 68       throat cancer   Heart disease Neg Hx      HOME MEDICATIONS: Allergies as of 10/11/2024   No Known Allergies      Medication List        Accurate as of October 11, 2024  7:13 AM. If you have any questions, ask your nurse or doctor.          aspirin  EC 81 MG tablet Commonly known as: EQ Aspirin  Adult Low Dose Take 1 tablet (81 mg total) by mouth  daily. Swallow whole: For heart health   buPROPion  300 MG 24 hr tablet Commonly known as: WELLBUTRIN  XL Take 300 mg by mouth daily.   Dexcom G7 Sensor Misc 1 Device by Does not apply route as directed.   Embecta Pen Needle Nano 2 Gen 32G X 4 MM Misc Generic drug: Insulin  Pen Needle USE 3 TIMES DAILY   ezetimibe  10 MG tablet Commonly known  as: ZETIA  Take 10 mg by mouth daily.   gabapentin  300 MG capsule Commonly known as: NEURONTIN  Take 1 capsule (300 mg total) by mouth 2 (two) times daily. For agitation/neuropathic pain   HYDROcodone -acetaminophen  5-325 MG tablet Commonly known as: NORCO/VICODIN Take 1 tablet by mouth every 12 (twelve) hours as needed for moderate pain.   insulin  lispro 100 UNIT/ML injection Commonly known as: HumaLOG  120 units per day per pump   insulin  lispro 100 UNIT/ML KwikPen Commonly known as: HumaLOG  KwikPen Max 30 units a day for emergency if the pump fails   lisinopril  40 MG tablet Commonly known as: ZESTRIL  Take 1 tablet (40 mg total) by mouth daily. For high blood pressure   metoprolol  tartrate 50 MG tablet Commonly known as: LOPRESSOR  Take 1 tablet (50 mg total) by mouth 2 (two) times daily. For high blood pressure   Movantik 25 MG Tabs tablet Generic drug: naloxegol oxalate Take by mouth daily.   nitroGLYCERIN  0.4 MG SL tablet Commonly known as: NITROSTAT  Place 1 tablet (0.4 mg total) under the tongue every 5 (five) minutes as needed for chest pain (up to 3 doses).   olmesartan 20 MG tablet Commonly known as: BENICAR Take 20 mg by mouth daily.   Omnipod 5 G7 Pods (Gen 5) Misc 1 Device by Does not apply route every other day.   Ozempic  (0.25 or 0.5 MG/DOSE) 2 MG/3ML Sopn Generic drug: Semaglutide (0.25 or 0.5MG /DOS) Inject 0.5 mg into the skin once a week.   pantoprazole  40 MG tablet Commonly known as: PROTONIX  Take 1 tablet (40 mg total) by mouth daily. For acid reflux   rosuvastatin  40 MG tablet Commonly known as:  CRESTOR  Take 1 tablet (40 mg total) by mouth daily. For high cholesterol   sertraline  100 MG tablet Commonly known as: ZOLOFT  Take 1 tablet (100 mg total) by mouth daily. For depression         ALLERGIES: No Known Allergies   REVIEW OF SYSTEMS: A comprehensive ROS was conducted with the patient and is negative except as per HPI     OBJECTIVE:   VITAL SIGNS: There were no vitals taken for this visit.   PHYSICAL EXAM:  General: Pt appears well and is in NAD  Neuro: MS is good with appropriate affect, pt is alert and Ox3      DATA REVIEWED:  Lab Results  Component Value Date   HGBA1C 7.4 (A) 06/07/2024   HGBA1C 7.4 (A) 08/11/2022   HGBA1C 7.8 (A) 03/20/2022    Latest Reference Range & Units 06/07/24 15:34  Sodium 135 - 146 mmol/L 137  Potassium 3.5 - 5.3 mmol/L 4.8  Chloride 98 - 110 mmol/L 106  CO2 20 - 32 mmol/L 23  Glucose 65 - 139 mg/dL 646 (H)  BUN 7 - 25 mg/dL 23  Creatinine 9.29 - 8.64 mg/dL 8.44 (H)  Calcium  8.6 - 10.3 mg/dL 9.4  BUN/Creatinine Ratio 6 - 22 (calc) 15  eGFR > OR = 60 mL/min/1.50m2 49 (L)      Latest Reference Range & Units 06/07/24 15:34  Total CHOL/HDL Ratio <5.0 (calc) 2.4  Cholesterol <200 mg/dL 889  HDL Cholesterol > OR = 40 mg/dL 46  LDL Cholesterol (Calc) mg/dL (calc) 43  MICROALB/CREAT RATIO <30 mg/g creat NOTE  Non-HDL Cholesterol (Calc) <130 mg/dL (calc) 64  Triglycerides <150 mg/dL 871    Latest Reference Range & Units 06/07/24 15:34  TSH 0.40 - 4.50 mIU/L 1.00    Latest Reference Range & Units 06/07/24 15:34  Microalb, Ur mg/dL <9.7  MICROALB/CREAT RATIO <30 mg/g creat NOTE  Creatinine, Urine 20 - 320 mg/dL 80    ASSESSMENT / PLAN / RECOMMENDATIONS:   1) Type 1 Diabetes Mellitus, Sub-Optimally controlled, With Neuropathic and macrovascular  complications -  Goal A1c < 7.0 %.    -This was a virtual visit -Patient has been noted with hypoglycemia overnight upon downloading CGM/insulin  pump, I have decreased his  basal rate as below -He is tolerating Ozempic , will increase -Intolerant to metformin  - I did explain to the patient that the pod holds 200 units, he uses on average around 70 units a day, which does explain the frequency of OmniPod changes    MEDICATIONS: Increase Ozempic  1 mg weekly Humalog  per insulin  pump    Pump   OmniPod Settings   Insulin  type   Humalog    Basal rate       0000 1.05 u/h               I:C ratio       0000 1:1    Enters 15-18 g with each meal      Sensitivity       0000  30      Goal       0000            EDUCATION / INSTRUCTIONS: BG monitoring instructions: Patient is instructed to check his blood sugars 3 times a day, before meals. Call [facility] if: BG persistently < 70  I reviewed the Rule of 15 for the treatment of hypoglycemia in detail with the patient. Literature supplied.   2) Diabetic complications:  Eye: Does not have known diabetic retinopathy.  Neuro/ Feet: Does  have known diabetic peripheral neuropathy. Renal: Patient does not have known baseline CKD. He is on an ACEI/ARB at present.   3) Dyslipidemia :   - Lipid panel has been at goal, no change  Medication Continue rosuvastatin  40 mg daily   Follow-up in 6 months   Signed electronically by: [name] Redgie Butts, MD  [facility] Endocrinology  [facility] [address] [place] [place], [place] [address] Phone: [phone] FAX: [phone]   CC: Lennie Railing, NP (Inactive) No address on file Phone: None  Fax: None    Return to Endocrinology clinic as below: Future Appointments  Date Time Provider Department Center  10/11/2024 10:10 AM Deina Lipsey, Donell Redgie, MD [id number] None

## 2024-10-11 NOTE — Telephone Encounter (Signed)
Please schedule the patient for follow-up visit with me in 6 months   Thanks

## 2024-12-06 ENCOUNTER — Telehealth: Payer: Self-pay | Admitting: Nutrition

## 2024-12-06 NOTE — Telephone Encounter (Signed)
 Patient reported that his OmniPod PDM quit working yesterday and they have sent him a new one.  He is not home as yet.  He was given the pump settings from Dr. Kris last office note on 10/11/24.  He was also given the 800 help line number for OmniPod if has difficulty putting all of the settings into it.

## 2024-12-07 ENCOUNTER — Telehealth: Payer: Self-pay

## 2024-12-07 NOTE — Telephone Encounter (Signed)
 Pharmacist called stating patient has been using more than the allowable units on current RX due to an illness and he needs a refill already. A new RX is needed that allows increase.

## 2024-12-07 NOTE — Telephone Encounter (Signed)
 Omnipod summary printed for review.

## 2024-12-11 ENCOUNTER — Telehealth: Payer: Self-pay | Admitting: Nutrition

## 2024-12-11 NOTE — Telephone Encounter (Signed)
 LVM to call me to discuss his insulin  usage and amounts.

## 2024-12-14 ENCOUNTER — Telehealth: Payer: Self-pay | Admitting: Dietician

## 2024-12-14 NOTE — Telephone Encounter (Signed)
 Patient is returning Linda's call related to insulin  usage via pump. Patient received his new PDM and started this yesterday.  Reviewed that his pump settings were correct.  Last not of ISF was 30.  Patient states that his ISF is currently set at 25.  Patient instructed and changed the ISF to 30 per Dr. Kris last visit.  He states that he fills each POD with 200 units and a POD frequently lasts only 2 day.  His previous PDM was sometimes giving him an early alert that his POD was expiring and he would have insulin  left in the POD.  He is bolusing for meals using 15-18 units per meal but noted that he occasionally misses a bolus and occasionally has a 4th meal bolus.  Will forward to [place] to review this as well.  Leita Constable, RD, LDN, CDCES, DipACLM

## 2025-04-10 ENCOUNTER — Ambulatory Visit: Admitting: Internal Medicine
